# Patient Record
Sex: Female | Born: 1990 | Race: Black or African American | Hispanic: No | Marital: Single | Smoking: Former smoker
Health system: Southern US, Community
[De-identification: ages and names within clinical notes are randomized; demographics above are authoritative.]

## PROBLEM LIST (undated history)

## (undated) DIAGNOSIS — D649 Anemia, unspecified: Secondary | ICD-10-CM

## (undated) DIAGNOSIS — R87629 Unspecified abnormal cytological findings in specimens from vagina: Secondary | ICD-10-CM

## (undated) DIAGNOSIS — R161 Splenomegaly, not elsewhere classified: Secondary | ICD-10-CM

## (undated) DIAGNOSIS — K754 Autoimmune hepatitis: Secondary | ICD-10-CM

## (undated) DIAGNOSIS — I8501 Esophageal varices with bleeding: Secondary | ICD-10-CM

## (undated) DIAGNOSIS — K7682 Hepatic encephalopathy: Secondary | ICD-10-CM

## (undated) DIAGNOSIS — D689 Coagulation defect, unspecified: Secondary | ICD-10-CM

## (undated) DIAGNOSIS — N912 Amenorrhea, unspecified: Secondary | ICD-10-CM

## (undated) DIAGNOSIS — R011 Cardiac murmur, unspecified: Secondary | ICD-10-CM

## (undated) DIAGNOSIS — R188 Other ascites: Secondary | ICD-10-CM

## (undated) DIAGNOSIS — K7460 Unspecified cirrhosis of liver: Secondary | ICD-10-CM

## (undated) DIAGNOSIS — K219 Gastro-esophageal reflux disease without esophagitis: Secondary | ICD-10-CM

## (undated) DIAGNOSIS — K652 Spontaneous bacterial peritonitis: Secondary | ICD-10-CM

## (undated) DIAGNOSIS — K7290 Hepatic failure, unspecified without coma: Secondary | ICD-10-CM

## (undated) DIAGNOSIS — A64 Unspecified sexually transmitted disease: Secondary | ICD-10-CM

## (undated) DIAGNOSIS — Z9289 Personal history of other medical treatment: Secondary | ICD-10-CM

## (undated) HISTORY — DX: Unspecified abnormal cytological findings in specimens from vagina: R87.629

## (undated) HISTORY — PX: ESOPHAGEAL VARICE LIGATION: SHX625

## (undated) HISTORY — PX: PARACENTESIS: SHX844

---
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
## 2010-01-28 DIAGNOSIS — D649 Anemia, unspecified: Secondary | ICD-10-CM

## 2010-01-28 HISTORY — DX: Anemia, unspecified: D64.9

## 2011-03-24 ENCOUNTER — Encounter (HOSPITAL_COMMUNITY): Payer: Self-pay | Admitting: Emergency Medicine

## 2011-03-24 ENCOUNTER — Emergency Department (HOSPITAL_COMMUNITY)
Admission: EM | Admit: 2011-03-24 | Discharge: 2011-03-24 | Disposition: A | Discharge status: Home / Self Care | Payer: Medicaid Other | Attending: Emergency Medicine | Admitting: Emergency Medicine

## 2011-03-24 DIAGNOSIS — R1110 Vomiting, unspecified: Secondary | ICD-10-CM | POA: Insufficient documentation

## 2011-03-24 DIAGNOSIS — K754 Autoimmune hepatitis: Secondary | ICD-10-CM

## 2011-03-24 DIAGNOSIS — R1013 Epigastric pain: Secondary | ICD-10-CM | POA: Insufficient documentation

## 2011-03-24 LAB — COMPREHENSIVE METABOLIC PANEL
ALT: 115 U/L — ABNORMAL HIGH (ref 0–35)
Alkaline Phosphatase: 933 U/L — ABNORMAL HIGH (ref 39–117)
BUN: 6 mg/dL (ref 6–23)
CO2: 20 mEq/L (ref 19–32)
Chloride: 106 mEq/L (ref 96–112)
GFR calc Af Amer: >90 mL/min (ref >90)
GFR calc non Af Amer: >90 mL/min (ref >90)
Glucose, Bld: 121 mg/dL — ABNORMAL HIGH (ref 70–99)
Potassium: 2.9 mEq/L — ABNORMAL LOW (ref 3.5–5.1)
Sodium: 139 mEq/L (ref 135–145)
Total Bilirubin: 5.5 mg/dL — ABNORMAL HIGH (ref 0.3–1.2)
Total Protein: 6.6 g/dL (ref 6.0–8.3)

## 2011-03-24 LAB — CBC
Hemoglobin: 12.3 g/dL (ref 12.0–15.0)
MCH: 28.9 pg (ref 26.0–34.0)
RBC: 4.25 MIL/uL (ref 3.87–5.11)
WBC: 10.3 10*3/uL (ref 4.0–10.5)

## 2011-03-24 LAB — LIPASE, BLOOD: Lipase: 24 U/L (ref 11–59)

## 2011-03-24 LAB — URINALYSIS, ROUTINE W REFLEX MICROSCOPIC
Hgb urine dipstick: NEGATIVE
Ketones, ur: NEGATIVE mg/dL
Nitrite: NEGATIVE
Protein, ur: NEGATIVE mg/dL
Urobilinogen, UA: 1 mg/dL (ref 0.0–1.0)

## 2011-03-24 LAB — DIFFERENTIAL
Eosinophils Absolute: 0 10*3/uL (ref 0.0–0.7)
Lymphocytes Relative: 4 % — ABNORMAL LOW (ref 12–46)
Lymphs Abs: 0.5 10*3/uL — ABNORMAL LOW (ref 0.7–4.0)
Monocytes Relative: 9 % (ref 3–12)
Neutrophils Relative %: 86 % — ABNORMAL HIGH (ref 43–77)

## 2011-03-24 MED ORDER — ONDANSETRON HCL 4 MG PO TABS: 4.0000 mg | ORAL_TABLET | Freq: Three times a day (TID) | ORAL | Status: AC | PRN

## 2011-03-24 MED ORDER — MORPHINE SULFATE 4 MG/ML IJ SOLN
4.0000 mg | Freq: Once | INTRAMUSCULAR | Status: AC
Start: 2011-03-24 — End: 2011-03-24
  Administered 2011-03-24: 4 mg via INTRAVENOUS
  Filled 2011-03-24: qty 1

## 2011-03-24 MED ORDER — ONDANSETRON HCL 4 MG/2ML IJ SOLN
4.0000 mg | Freq: Once | INTRAMUSCULAR | Status: AC
  Administered 2011-03-24: 4 mg via INTRAVENOUS
  Filled 2011-03-24: qty 2

## 2011-03-24 MED ORDER — SODIUM CHLORIDE 0.9 % IV SOLN
1000.0000 mL | Freq: Once | INTRAVENOUS | Status: AC
  Administered 2011-03-24: 1000 mL via INTRAVENOUS

## 2011-03-24 MED ORDER — POTASSIUM CHLORIDE 10 MEQ/100ML IV SOLN
10.0000 meq | Freq: Once | INTRAVENOUS | Status: AC
  Administered 2011-03-24: 10 meq via INTRAVENOUS
  Filled 2011-03-24: qty 100

## 2011-03-24 MED ORDER — MORPHINE SULFATE 4 MG/ML IJ SOLN
4.0000 mg | Freq: Once | INTRAMUSCULAR | Status: AC
  Administered 2011-03-24: 4 mg via INTRAVENOUS
  Filled 2011-03-24: qty 1

## 2011-03-24 MED ORDER — SODIUM CHLORIDE 0.9 % IV SOLN
1000.0000 mL | INTRAVENOUS | Status: DC
  Administered 2011-03-24: 1000 mL via INTRAVENOUS

## 2011-03-24 MED ORDER — OXYCODONE HCL 5 MG PO TABS: 5.0000 mg | ORAL_TABLET | ORAL | Status: AC | PRN

## 2011-03-24 NOTE — ED Provider Notes (Signed)
History     CSN: [id number]  Arrival date & time 03/24/11  0820   First MD Initiated Contact with Patient 03/24/11 [phone]      Chief Complaint  Patient presents with  . Abdominal Pain  . Emesis    HPI Pt comes in with severe abdominal pain and vomiting since 4 am.  She says she does not know how many times she has vomited.  She denies bloody or bilious vomiting.  She denies sick contacts.  She says she has felt feverish and chills.  She denies diarrhea.  Pt has history of autoimmune hepatitis, but is not able to say much about the state of her liver.  She says She takes medications for it, but cannot name them right now.   Pt denies any dysuria, back pain, or vaginal discharge.  She says she is sexually active with her boyfriend only, they use condoms and she does not think she is pregnant.   Pt's hepatologist is Dr. Woodfin Ganja at [facility].  Pt says she has had her LFT's elevated before, she has been on and off the transplant list due to fluctuating liver tests.   Past Medical History  Diagnosis Date  . Liver disease     Past Surgical History  Procedure Date  . Gastric banding port revision   . Tonsillectomy     History reviewed. No pertinent family history.  History  Substance Use Topics  . Smoking status: Never Smoker   . Smokeless tobacco: Not on file  . Alcohol Use: No     Review of Systems  Constitutional: Positive for fever and chills.  HENT: Negative for congestion.   Eyes: Negative for visual disturbance.  Respiratory: Negative for shortness of breath.   Cardiovascular: Negative for chest pain.  Gastrointestinal: Positive for nausea, vomiting and abdominal pain. Negative for diarrhea and blood in stool.  Genitourinary: Negative for dysuria, hematuria, decreased urine volume, vaginal discharge and vaginal pain.  Musculoskeletal: Negative for back pain.  Skin: Negative for rash.  Neurological: Positive for dizziness.    Allergies  Review of patient's  allergies indicates no known allergies.  Home Medications   Current Outpatient Rx  Name Route Sig Dispense Refill  . AZATHIOPRINE 50 MG PO TABS Oral Take 50 mg by mouth at bedtime.     Marland Kitchen LACTULOSE 10 GM/15ML PO SOLN Oral Take 20 g by mouth 2 (two) times daily as needed. For constipation.    Marland Kitchen LEVOFLOXACIN 750 MG PO TABS Oral Take 750 mg by mouth daily.    Marland Kitchen PREDNISONE 5 MG PO TABS Oral Take 5 mg by mouth daily.    Marland Kitchen PROPRANOLOL HCL ER 60 MG PO CP24 Oral Take 60 mg by mouth daily.    Marland Kitchen SPIRONOLACTONE 25 MG PO TABS Oral Take 25 mg by mouth at bedtime.     Marland Kitchen URSODIOL 300 MG PO CAPS Oral Take 600 mg by mouth every morning.       BP 115/75  Pulse 80  Temp(Src) 99.4 F (37.4 C) (Oral)  Resp 18  SpO2 99%  LMP 03/07/2011  Physical Exam  Nursing note and vitals reviewed. Constitutional: She is oriented to person, place, and time. She appears well-developed and well-nourished. She appears distressed.  HENT:  Head: Normocephalic and atraumatic.  Eyes: EOM are normal. Pupils are equal, round, and reactive to light.  Neck: Normal range of motion. Neck supple.  Cardiovascular: Normal rate, regular rhythm, normal heart sounds and intact distal pulses.  Pulmonary/Chest: Effort normal and breath sounds normal. No respiratory distress.  Abdominal: Soft. There is tenderness. There is no rebound.       Pt extremely tender to palpation, particularly in upper quadrants, unable to palpate fully, but no hepatosplenomegaly.   Musculoskeletal: She exhibits no edema.  Neurological: She is alert and oriented to person, place, and time.  Skin: Skin is warm. No rash noted.    ED Course  Procedures (including critical care time)  Labs Reviewed  CBC - Abnormal; Notable for the following:    RDW 16.5 (*)    Platelets 74 (*) PLATELET COUNT CONFIRMED BY SMEAR   All other components within normal limits  DIFFERENTIAL - Abnormal; Notable for the following:    Neutrophils Relative 86 (*)    Neutro Abs  8.9 (*)    Lymphocytes Relative 4 (*)    Lymphs Abs 0.5 (*)    All other components within normal limits  COMPREHENSIVE METABOLIC PANEL - Abnormal; Notable for the following:    Potassium 2.9 (*)    Glucose, Bld 121 (*)    Albumin 3.0 (*)    AST 153 (*)    ALT 115 (*)    Alkaline Phosphatase 933 (*)    Total Bilirubin 5.5 (*)    All other components within normal limits  URINALYSIS, ROUTINE W REFLEX MICROSCOPIC - Abnormal; Notable for the following:    Color, Urine AMBER (*) BIOCHEMICALS MAY BE AFFECTED BY COLOR   Bilirubin Urine SMALL (*)    All other components within normal limits  LIPASE, BLOOD  PREGNANCY, URINE   No results found.   1. Autoimmune hepatitis   2. Abdominal pain, epigastric   3. Vomiting       MDM  LFT's elevated. Pt symptomatically improved with IV Zofran and  Morphine. Called on-call physician at [facility].  I spoke with Dr. Marcy Salvo, GI Fellow.  He reviewed pt's chart and said that her last set of liver enzymes were very similar to the ones from today shown above.  He felt that if patient's pain and nausea could be controlled she could be safely discharged home and have close follow up with Dr. Piedad Climes.          Ardyth Gal, MD 03/24/11 1435

## 2011-03-24 NOTE — ED Notes (Signed)
Pt reports, "I have stomach pains and I have been throwing up since 4 O'clock this morning."

## 2011-03-24 NOTE — ED Notes (Signed)
Pt comfortable. Family at the bedside.

## 2011-03-24 NOTE — Discharge Instructions (Signed)
Autoimmune Hepatitis Autoimmune hepatitis is a disease in which the body's immune system attacks liver cells. The immune system is the system in your body which fights off disease. When the body's immune system attacks the liver, the liver becomes inflamed and causes hepatitis. Researchers think a genetic factor (passed from parent to child) may make it more likely for some people to have autoimmune diseases. About 70% of those with autoimmune hepatitis are women. Most people with autoimmune hepatitis are between the ages of 92 and 56, but the disease may happen at any age. The disease is usually serious. If not treated, it will get worse over time, and it can last for years. It can lead to scarring and hardening of the liver (cirrhosis), and it can lead to liver failure. Autoimmune hepatitis is classified as either type 1 or 2. Type 1 is the most common form in [place]. It occurs at any age and is more common among women. About half of those with type 1 have other autoimmune disorders, such as type 1 diabetes, proliferative glomerulonephritis, thyroiditis, Kassebaum' disease, Sjgren's syndrome, autoimmune anemia, and ulcerative colitis. Your caregiver can tell you about these disorders and if they apply to you. Type 2 autoimmune hepatitis is less common. It usually affects girls ages 2 to 33, but adults can have it, too. AUTOIMMUNE DISEASE One job of the immune system is to protect the body from viruses, bacteria, and other living organisms. Usually, the immune system does not react against the body's own cells. Occasionally, the immune system mistakenly attacks the cells it is supposed to protect, and when this happens, the response is called autoimmunity. Researchers think that certain bacteria, viruses, toxins, and drugs trigger an autoimmune response in people who are genetically susceptible to developing an autoimmune disorder. This means it could be a tendency you inherited from your  parents. SYMPTOMS  Fatigue is probably the most common symptom of this disease. Other symptoms include:  Enlarged liver.   Jaundice.   Itching.   Skin rashes.   Joint pain.   Abdominal discomfort.   Abnormal blood vessels (spider angiomas) on the skin.   Nausea.   Vomiting.   Loss of appetite.   Dark urine.   Pale or gray colored stools.  People in advanced stages of the disease are more likely to have symptoms such as fluid in the abdomen (ascites) or mental confusion. Women may stop having menstrual periods.  Symptoms of autoimmune hepatitis range from mild to severe. Hepatitis caused by a virus or certain medications may have similar symptoms as autoimmune hepatitis, so tests may be needed for an exact diagnosis. Your caregiver will also review all your medicines before diagnosing autoimmune hepatitis. DIAGNOSIS  Your caregiver will make a diagnosis based on your symptoms, blood tests, and liver biopsy.  Blood tests. A routine blood test for liver enzymes can help show a common pattern of hepatitis.   Liver biopsy. A tiny sample of your liver tissue will be examined under a microscope. It can help your caregiver diagnose autoimmune hepatitis and tell how serious it is. You will go to a hospital or outpatient surgical facility for this procedure.  TREATMENT  Treatment works best when autoimmune hepatitis is discovered early. With proper treatment, this disease can usually be controlled. Recent studies show that continued response to treatment not only stops the disease from getting worse, but also may actually reverse some of the damage. The primary treatment is medicine to slow down (suppress) an overactive immune  system. Both types of autoimmune hepatitis are treated with daily doses of a corticosteroid. This is also called prednisone. Your caregiver may start you on a high dose (20 to 60 mg per day) and lower the dose to 5 to 15 mg per day as the disease is controlled. The  goal is to find the lowest possible dose that will control your disease. Another medicine, azathioprine, is also used to treat autoimmune hepatitis. Like prednisone, azathioprine suppresses the immune system, but in a different way. It helps lower the dose of prednisone needed, which reduces its side effects. Your caregiver may prescribe azathioprine, in addition to prednisone, once your disease is under control. Most people will need to take prednisone, with or without azathioprine, for years. Some people take it for life. These medicines may slow down the disease, but everyone is different. In about 1 out of every 3 people, treatment can eventually be stopped. After stopping, it is important to carefully monitor your condition. Promptly report any new symptoms to your caregiver. The disease may return and be even more severe. This is especially true during the first few months after stopping treatment. In about 7 out of 10 people, the disease goes into an inactive state (remission) within 2 years of starting treatment. During that time the symptoms are less severe. Some persons with a remission will see the disease return within 3 years, so treatment may be necessary on and off for years, and possibly for life. People with autoimmune hepatitis should also be vaccinated against hepatitis A virus and hepatitis B virus. Vaccination should be done as soon as possible after the diagnosis is made. Side Effects Both prednisone and azathioprine have side effects. Because high doses of prednisone are needed to control this disease, managing side effects is very important. Most side effects appear only after a long period of time. Some possible side effects of prednisone are:  Weight gain.   Thinning of the bones (osteoporosis).   Diabetes.   Cataracts.   Acne.   Anxiety and confusion.   Thinning of the hair and skin.   High blood pressure.   Glaucoma.   Rounded face.  Azathioprine can lower your  white blood cell count; these are the cells that fight infection. Sometimes it causes nausea and poor appetite. Rare side effects are:  Allergic reaction.   Liver damage.   Inflammation of the pancreas gland with severe stomach pain (pancreatitis).   More susceptible to infection.   Diarrhea.  People who do not get better with standard immune therapy or who have severe side effects may be helped by other medicine. People who progress to end stage liver disease (liver failure) or cirrhosis or both may need a liver transplant. Transplantation has a 1-year survival rate of 90% and a 5-year survival rate of 70% to 80%. FOR MORE INFORMATION  American Liver Foundation (ALF): www.liverfoundation.org National Digestive Diseases Information Clearinghouse: CheatPrevention.com.au Document Released: 04/06/2003 Document Revised: 09/26/2010 Document Reviewed: 11/16/2008 [facility] Patient Information 2012 [place], [place].

## 2011-03-24 NOTE — ED Notes (Signed)
Family at bedside and tolerating fluids orally.

## 2011-03-24 NOTE — ED Notes (Signed)
Pt up to restroom and obtaining urine sample

## 2011-03-24 NOTE — ED Notes (Signed)
Pt knows that urine is needed. Pt does not have to void at this time. Pt is getting IV fluids. Pt and family was informed to tell staff when she has to void.

## 2011-03-24 NOTE — ED Notes (Signed)
Pt is resting and vomiting has resolved. Will continue to monitor

## 2011-03-24 NOTE — ED Provider Notes (Signed)
I saw and evaluated the patient, reviewed the resident's note and I agree with the findings and plan.   .Face to face Exam:  General:  Awake HEENT:  Atraumatic Resp:  Normal effort Abd:  Nondistended Neuro:No focal weakness Lymph: No adenopathy   Nelia Shi, MD 03/24/11 2244

## 2011-04-09 ENCOUNTER — Encounter (HOSPITAL_COMMUNITY): Payer: Self-pay | Admitting: Emergency Medicine

## 2011-04-09 ENCOUNTER — Emergency Department (HOSPITAL_COMMUNITY)
Admission: EM | Admit: 2011-04-09 | Discharge: 2011-04-09 | Disposition: A | Discharge status: Home / Self Care | Payer: Medicaid Other | Attending: Emergency Medicine | Admitting: Emergency Medicine

## 2011-04-09 DIAGNOSIS — R110 Nausea: Secondary | ICD-10-CM | POA: Insufficient documentation

## 2011-04-09 DIAGNOSIS — R109 Unspecified abdominal pain: Secondary | ICD-10-CM | POA: Insufficient documentation

## 2011-04-09 LAB — COMPREHENSIVE METABOLIC PANEL
ALT: 93 U/L — ABNORMAL HIGH (ref 0–35)
CO2: 23 mEq/L (ref 19–32)
Calcium: 9.2 mg/dL (ref 8.4–10.5)
Creatinine, Ser: 0.56 mg/dL (ref 0.50–1.10)
GFR calc Af Amer: >90 mL/min (ref >90)
GFR calc non Af Amer: >90 mL/min (ref >90)
Glucose, Bld: 109 mg/dL — ABNORMAL HIGH (ref 70–99)
Sodium: 136 mEq/L (ref 135–145)
Total Protein: 6.8 g/dL (ref 6.0–8.3)

## 2011-04-09 LAB — DIFFERENTIAL
Basophils Absolute: 0 10*3/uL (ref 0.0–0.1)
Eosinophils Absolute: 0.1 10*3/uL (ref 0.0–0.7)
Eosinophils Relative: 1 % (ref 0–5)
Lymphocytes Relative: 8 % — ABNORMAL LOW (ref 12–46)
Lymphs Abs: 0.9 10*3/uL (ref 0.7–4.0)
Monocytes Absolute: 0.4 10*3/uL (ref 0.1–1.0)

## 2011-04-09 LAB — CBC
HCT: 36.1 % (ref 36.0–46.0)
MCH: 29.3 pg (ref 26.0–34.0)
MCV: 86.8 fL (ref 78.0–100.0)
Platelets: 155 10*3/uL (ref 150–400)
RDW: 16.1 % — ABNORMAL HIGH (ref 11.5–15.5)
WBC: 11.1 10*3/uL — ABNORMAL HIGH (ref 4.0–10.5)

## 2011-04-09 MED ORDER — ONDANSETRON HCL 4 MG/2ML IJ SOLN
4.0000 mg | Freq: Once | INTRAMUSCULAR | Status: AC
  Administered 2011-04-09: 4 mg via INTRAVENOUS
  Filled 2011-04-09: qty 2

## 2011-04-09 MED ORDER — HYDROMORPHONE HCL PF 1 MG/ML IJ SOLN
1.0000 mg | Freq: Once | INTRAMUSCULAR | Status: AC
  Administered 2011-04-09: 1 mg via INTRAVENOUS
  Filled 2011-04-09: qty 1

## 2011-04-09 MED ORDER — SODIUM CHLORIDE 0.9 % IV BOLUS (SEPSIS)
1000.0000 mL | Freq: Once | INTRAVENOUS | Status: AC
  Administered 2011-04-09: 1000 mL via INTRAVENOUS

## 2011-04-09 NOTE — ED Notes (Signed)
Oxygen 1L applied via [place] due to sats dropping after pain meds and falling asleep

## 2011-04-09 NOTE — ED Notes (Signed)
Patient vomiting at this time.

## 2011-04-09 NOTE — ED Notes (Signed)
Patient presents crying with abd pain  States that she has liver disease

## 2011-04-09 NOTE — ED Provider Notes (Signed)
History     CSN: [id number]  Arrival date & time 04/09/11  9562   First MD Initiated Contact with Patient 04/09/11 [phone]      Chief Complaint  Patient presents with  . Abdominal Pain    HPI The patient presents with abdominal pain and nausea.  Her symptoms began approximately 2 hours ago, subacutely.  Since onset there is been persistent diffuse crampy/sharp abdominal pain.  The patient has not attempted any relief with OTC medications thus far.  She is nauseous, but denies any vomiting or diarrhea.  She denies any dyspnea or chest pain.  No f/c. Notably the patient has autoimmune hepatitis and is a patient at [facility].  Past Medical History  Diagnosis Date  . Liver disease   . Hepatitis     Past Surgical History  Procedure Date  . Gastric banding port revision   . Tonsillectomy     No family history on file.  History  Substance Use Topics  . Smoking status: Never Smoker   . Smokeless tobacco: Not on file  . Alcohol Use: No    OB History    Grav Para Term Preterm Abortions TAB SAB Ect Mult Living                  Review of Systems  Constitutional:       HPI  HENT:       HPI otherwise negative  Eyes: Negative.   Respiratory:       HPI, otherwise negative  Cardiovascular:       HPI, otherwise nmegative  Gastrointestinal: Negative for vomiting.  Genitourinary:       HPI, otherwise negative  Musculoskeletal:       HPI, otherwise negative  Skin: Negative.   Neurological: Negative for syncope.    Allergies  Review of patient's allergies indicates no known allergies.  Home Medications   Current Outpatient Rx  Name Route Sig Dispense Refill  . AZATHIOPRINE 50 MG PO TABS Oral Take 100 mg by mouth daily.     Marland Kitchen LACTULOSE 10 GM/15ML PO SOLN Oral Take 20 g by mouth 2 (two) times daily as needed. For constipation.    Marland Kitchen LEVOFLOXACIN 750 MG PO TABS Oral Take 750 mg by mouth once a week.     Marland Kitchen OMEPRAZOLE 20 MG PO CPDR Oral Take 20 mg by mouth daily.    Marland Kitchen PREDNISONE  5 MG PO TABS Oral Take 5 mg by mouth daily.    Marland Kitchen PROPRANOLOL HCL ER 60 MG PO CP24 Oral Take 60 mg by mouth daily.    Marland Kitchen SPIRONOLACTONE 25 MG PO TABS Oral Take 25 mg by mouth at bedtime.     Marland Kitchen URSODIOL 300 MG PO CAPS Oral Take 300 mg by mouth 2 (two) times daily.       BP 94/41  Pulse 107  Temp(Src) 99.7 F (37.6 C) (Oral)  Resp 20  SpO2 95%  LMP 03/07/2011  Physical Exam  Nursing note and vitals reviewed. Constitutional: She is oriented to person, place, and time. She appears well-developed and well-nourished. No distress.  HENT:  Head: Normocephalic and atraumatic.  Eyes: Conjunctivae and EOM are normal.  Cardiovascular: Regular rhythm.  Tachycardia present.   Pulmonary/Chest: Effort normal and breath sounds normal. No stridor. No respiratory distress.  Abdominal: Soft. Normal appearance. She exhibits no distension. There is generalized tenderness. There is guarding. There is no rigidity, no rebound and no CVA tenderness.  Musculoskeletal: She exhibits no edema.  Neurological:  She is alert and oriented to person, place, and time. No cranial nerve deficit.  Skin: Skin is warm and dry.  Psychiatric: She has a normal mood and affect.    ED Course  Procedures (including critical care time)   Labs Reviewed  CBC  DIFFERENTIAL  COMPREHENSIVE METABOLIC PANEL  LIPASE, BLOOD  URINALYSIS, ROUTINE W REFLEX MICROSCOPIC   No results found.   No diagnosis found.    MDM  This young female with autoimmune hepatitis presents with abdominal pain, nausea.  On initial exam the patient was uncomfortable appearing, tachycardic.  Following ED interventions, the patient wasn't sleeping.  She was observed to be sleeping for 2 hours, and was discharged in stable condition.  The patient's labs were consistent with a recent presentation, following which the patient was discharged in stable condition with follow up with her specialist at [facility].  Today the patient was discharged in stable condition  with similar followup instructions  Gerhard Munch, MD 04/09/11 [phone]

## 2011-04-09 NOTE — ED Notes (Signed)
PT. REPORTS GENERALIZED ABDOMINAL PAIN WITH NAUSEA ONSETT HIS MORNING , DENIES VOMITTING /DIARRHEA OR FEVER , SLIGHT CHILLS.

## 2011-04-09 NOTE — Discharge Instructions (Signed)

## 2011-04-13 ENCOUNTER — Encounter (HOSPITAL_COMMUNITY): Payer: Self-pay | Admitting: Nurse Practitioner

## 2011-04-13 ENCOUNTER — Emergency Department (HOSPITAL_COMMUNITY)
Admission: EM | Admit: 2011-04-13 | Discharge: 2011-04-14 | Disposition: A | Discharge status: Home / Self Care | Payer: Medicaid Other | Attending: Emergency Medicine | Admitting: Emergency Medicine

## 2011-04-13 DIAGNOSIS — K769 Liver disease, unspecified: Secondary | ICD-10-CM | POA: Insufficient documentation

## 2011-04-13 DIAGNOSIS — R109 Unspecified abdominal pain: Secondary | ICD-10-CM | POA: Insufficient documentation

## 2011-04-13 DIAGNOSIS — N390 Urinary tract infection, site not specified: Secondary | ICD-10-CM

## 2011-04-13 DIAGNOSIS — K759 Inflammatory liver disease, unspecified: Secondary | ICD-10-CM | POA: Insufficient documentation

## 2011-04-13 DIAGNOSIS — F17200 Nicotine dependence, unspecified, uncomplicated: Secondary | ICD-10-CM | POA: Insufficient documentation

## 2011-04-13 DIAGNOSIS — R197 Diarrhea, unspecified: Secondary | ICD-10-CM | POA: Insufficient documentation

## 2011-04-13 DIAGNOSIS — R110 Nausea: Secondary | ICD-10-CM | POA: Insufficient documentation

## 2011-04-13 LAB — URINALYSIS, ROUTINE W REFLEX MICROSCOPIC
Nitrite: POSITIVE — AB
Protein, ur: NEGATIVE mg/dL
Specific Gravity, Urine: 1.013 (ref 1.005–1.030)
Urobilinogen, UA: 4 mg/dL — ABNORMAL HIGH (ref 0.0–1.0)

## 2011-04-13 LAB — CBC
MCH: 30.3 pg (ref 26.0–34.0)
MCV: 87.5 fL (ref 78.0–100.0)
Platelets: 63 10*3/uL — ABNORMAL LOW (ref 150–400)
RDW: 16.2 % — ABNORMAL HIGH (ref 11.5–15.5)

## 2011-04-13 LAB — URINE MICROSCOPIC-ADD ON

## 2011-04-13 LAB — DIFFERENTIAL
Basophils Absolute: 0 10*3/uL (ref 0.0–0.1)
Eosinophils Relative: 0 % (ref 0–5)
Lymphs Abs: 0.5 10*3/uL — ABNORMAL LOW (ref 0.7–4.0)
Monocytes Absolute: 0.4 10*3/uL (ref 0.1–1.0)

## 2011-04-13 LAB — COMPREHENSIVE METABOLIC PANEL
AST: 83 U/L — ABNORMAL HIGH (ref 0–37)
BUN: 11 mg/dL (ref 6–23)
CO2: 25 mEq/L (ref 19–32)
Calcium: 8.8 mg/dL (ref 8.4–10.5)
Creatinine, Ser: 0.62 mg/dL (ref 0.50–1.10)
GFR calc non Af Amer: >90 mL/min (ref >90)

## 2011-04-13 LAB — POCT PREGNANCY, URINE: Preg Test, Ur: NEGATIVE

## 2011-04-13 LAB — LIPASE, BLOOD: Lipase: 39 U/L (ref 11–59)

## 2011-04-13 MED ORDER — HYDROMORPHONE HCL PF 1 MG/ML IJ SOLN: 1.0000 mg | Freq: Once | INTRAMUSCULAR | Status: DC

## 2011-04-13 MED ORDER — SODIUM CHLORIDE 0.9 % IV BOLUS (SEPSIS)
1000.0000 mL | Freq: Once | INTRAVENOUS | Status: AC
  Administered 2011-04-13: 1000 mL via INTRAVENOUS

## 2011-04-13 MED ORDER — DEXTROSE 5 % IV SOLN
1.0000 g | Freq: Once | INTRAVENOUS | Status: AC
  Administered 2011-04-13: 1 g via INTRAVENOUS
  Filled 2011-04-13: qty 10

## 2011-04-13 MED ORDER — ONDANSETRON HCL 4 MG/2ML IJ SOLN
4.0000 mg | Freq: Once | INTRAMUSCULAR | Status: AC
  Administered 2011-04-13: 4 mg via INTRAVENOUS
  Filled 2011-04-13: qty 2

## 2011-04-13 MED ORDER — HYDROMORPHONE HCL PF 1 MG/ML IJ SOLN
0.5000 mg | Freq: Once | INTRAMUSCULAR | Status: AC
  Administered 2011-04-13: 0.5 mg via INTRAVENOUS
  Filled 2011-04-13: qty 1

## 2011-04-13 MED ORDER — HYDROMORPHONE HCL PF 1 MG/ML IJ SOLN
1.0000 mg | Freq: Once | INTRAMUSCULAR | Status: AC
  Administered 2011-04-13: 1 mg via INTRAVENOUS
  Filled 2011-04-13: qty 1

## 2011-04-13 NOTE — ED Provider Notes (Signed)
History     CSN: [id number]  Arrival date & time 04/13/11  2130   First MD Initiated Contact with Patient 04/13/11 2104      Chief Complaint  Patient presents with  . Abdominal Pain    (Consider location/radiation/quality/duration/timing/severity/associated sxs/prior treatment) HPI Pt with history of autoimmune hepatitis and multiple episodes of abdominal pain with nausea, followed at [facility]. Presents with upper abdominal pain starting this morning with nausea and several episodes of diarrhea. No fever, chills, vomiting, vaginal or urinary symptoms. Pain is same as similar flares of her chronic diseas Past Medical History  Diagnosis Date  . Liver disease   . Hepatitis     Past Surgical History  Procedure Date  . Gastric banding port revision   . Tonsillectomy     No family history on file.  History  Substance Use Topics  . Smoking status: Current Everyday Smoker -- 0.5 packs/day  . Smokeless tobacco: Not on file  . Alcohol Use: No    OB History    Grav Para Term Preterm Abortions TAB SAB Ect Mult Living                  Review of Systems  Constitutional: Negative for fever and chills.  Respiratory: Negative for shortness of breath.   Cardiovascular: Negative for chest pain.  Gastrointestinal: Positive for nausea, abdominal pain and diarrhea. Negative for vomiting, constipation and blood in stool.  Neurological: Negative for dizziness, weakness, light-headedness and numbness.    Allergies  Review of patient's allergies indicates no known allergies.  Home Medications   Current Outpatient Rx  Name Route Sig Dispense Refill  . AZATHIOPRINE 50 MG PO TABS Oral Take 100 mg by mouth daily.     Marland Kitchen LACTULOSE 10 GM/15ML PO SOLN Oral Take 20 g by mouth 2 (two) times daily as needed. For constipation.    Marland Kitchen LEVOFLOXACIN 750 MG PO TABS Oral Take 750 mg by mouth once a week.     Marland Kitchen OMEPRAZOLE 20 MG PO CPDR Oral Take 20 mg by mouth daily.    Marland Kitchen PREDNISONE 5 MG PO TABS Oral  Take 5 mg by mouth daily.    Marland Kitchen PROPRANOLOL HCL ER 60 MG PO CP24 Oral Take 60 mg by mouth daily.    Marland Kitchen SPIRONOLACTONE 25 MG PO TABS Oral Take 25 mg by mouth at bedtime.     Marland Kitchen URSODIOL 300 MG PO CAPS Oral Take 300 mg by mouth 2 (two) times daily.     . TRAMADOL HCL 50 MG PO TABS Oral Take 1 tablet (50 mg total) by mouth every 6 (six) hours as needed for pain. 30 tablet 0    BP 96/59  Pulse 94  Temp(Src) 98.8 F (37.1 C) (Oral)  Resp 22  Ht 4\' 11"  (1.499 m)  Wt 129 lb (58.514 kg)  BMI 26.05 kg/m2  SpO2 99%  LMP 03/07/2011  Physical Exam  Nursing note and vitals reviewed. Constitutional: She is oriented to person, place, and time. She appears well-developed and well-nourished. She appears distressed.  HENT:  Head: Normocephalic and atraumatic.  Mouth/Throat: Oropharynx is clear and moist.  Eyes: EOM are normal. Pupils are equal, round, and reactive to light.  Neck: Normal range of motion. Neck supple.  Cardiovascular: Normal rate and regular rhythm.   Pulmonary/Chest: Effort normal and breath sounds normal. No respiratory distress. She has no wheezes. She has no rales.  Abdominal: Soft. Bowel sounds are normal. There is tenderness (difficult exam due to pt's  lack of cooperation). There is no rebound and no guarding.  Musculoskeletal: Normal range of motion. She exhibits no edema and no tenderness.  Neurological: She is alert and oriented to person, place, and time.  Skin: Skin is warm and dry. No rash noted. No erythema.  Psychiatric: She has a normal mood and affect. Her behavior is normal.    ED Course  Procedures (including critical care time)  Labs Reviewed  URINALYSIS, ROUTINE W REFLEX MICROSCOPIC - Abnormal; Notable for the following:    Color, Urine ORANGE (*) BIOCHEMICALS MAY BE AFFECTED BY COLOR   APPearance CLOUDY (*)    Hgb urine dipstick LARGE (*)    Bilirubin Urine LARGE (*)    Ketones, ur 15 (*)    Urobilinogen, UA 4.0 (*)    Nitrite POSITIVE (*)    Leukocytes,  UA TRACE (*)    All other components within normal limits  URINE MICROSCOPIC-ADD ON - Abnormal; Notable for the following:    Squamous Epithelial / LPF MANY (*)    Bacteria, UA MANY (*)    All other components within normal limits  CBC - Abnormal; Notable for the following:    WBC 11.9 (*)    RBC 3.76 (*)    Hemoglobin 11.4 (*)    HCT 32.9 (*)    RDW 16.2 (*)    Platelets 63 (*) PLATELET COUNT CONFIRMED BY SMEAR   All other components within normal limits  DIFFERENTIAL - Abnormal; Notable for the following:    Neutrophils Relative 93 (*)    Lymphocytes Relative 4 (*)    Neutro Abs 11.0 (*)    Lymphs Abs 0.5 (*)    All other components within normal limits  COMPREHENSIVE METABOLIC PANEL - Abnormal; Notable for the following:    Glucose, Bld 111 (*)    Albumin 2.8 (*)    AST 83 (*)    ALT 70 (*)    Alkaline Phosphatase 416 (*)    Total Bilirubin 6.6 (*)    All other components within normal limits  POCT PREGNANCY, URINE  LIPASE, BLOOD  PREGNANCY, URINE  URINE CULTURE   No results found.   1. Abdominal pain   2. UTI (lower urinary tract infection)       MDM  Pt states she is feeling much better. Will d/c home to f/u with PMD at [facility]. Return for worsening pain or concerns        Loren Racer, MD 04/14/11 [phone]

## 2011-04-13 NOTE — ED Notes (Signed)
Pt's family reports pt is having increased nausea - pt remain A&Ox4, pt given emesis bag. Will continue to monitor.

## 2011-04-13 NOTE — ED Notes (Signed)
C/o abd pain, nausea, loose stool x 2  since this am.

## 2011-04-14 MED ORDER — TRAMADOL HCL 50 MG PO TABS: 50.0000 mg | ORAL_TABLET | Freq: Four times a day (QID) | ORAL | Status: AC | PRN

## 2011-04-14 NOTE — Discharge Instructions (Signed)
 Abdominal Pain Abdominal pain can be caused by many things. Your caregiver decides the seriousness of your pain by an examination and possibly blood tests and X-rays. Many cases can be observed and treated at home. Most abdominal pain is not caused by a disease and will probably improve without treatment. However, in many cases, more time must pass before a clear cause of the pain can be found. Before that point, it may not be known if you need more testing, or if hospitalization or surgery is needed. HOME CARE INSTRUCTIONS   Do not take laxatives unless directed by your caregiver.   Take pain medicine only as directed by your caregiver.   Only take over-the-counter or prescription medicines for pain, discomfort, or fever as directed by your caregiver.   Try a clear liquid diet (broth, tea, or water) for as long as directed by your caregiver. Slowly move to a bland diet as tolerated.  SEEK IMMEDIATE MEDICAL CARE IF:   The pain does not go away.   You have a fever.   You keep throwing up (vomiting).   The pain is felt only in portions of the abdomen. Pain in the right side could possibly be appendicitis. In an adult, pain in the left lower portion of the abdomen could be colitis or diverticulitis.   You pass bloody or black tarry stools.  MAKE SURE YOU:   Understand these instructions.   Will watch your condition.   Will get help right away if you are not doing well or get worse.  Document Released: 10/24/2004 Document Revised: 01/03/2011 Document Reviewed: 09/02/2007 [facility] Patient Information 2012 [place], [place].

## 2011-06-09 ENCOUNTER — Encounter (HOSPITAL_COMMUNITY): Payer: Self-pay

## 2011-06-09 ENCOUNTER — Emergency Department (HOSPITAL_COMMUNITY)
Admission: EM | Admit: 2011-06-09 | Discharge: 2011-06-09 | Disposition: A | Discharge status: Home / Self Care | Payer: Medicaid Other | Attending: Emergency Medicine | Admitting: Emergency Medicine

## 2011-06-09 DIAGNOSIS — R1013 Epigastric pain: Secondary | ICD-10-CM | POA: Insufficient documentation

## 2011-06-09 DIAGNOSIS — R1031 Right lower quadrant pain: Secondary | ICD-10-CM | POA: Insufficient documentation

## 2011-06-09 DIAGNOSIS — R109 Unspecified abdominal pain: Secondary | ICD-10-CM | POA: Insufficient documentation

## 2011-06-09 DIAGNOSIS — K754 Autoimmune hepatitis: Secondary | ICD-10-CM

## 2011-06-09 DIAGNOSIS — R1110 Vomiting, unspecified: Secondary | ICD-10-CM | POA: Insufficient documentation

## 2011-06-09 LAB — COMPREHENSIVE METABOLIC PANEL
ALT: 134 U/L — ABNORMAL HIGH (ref 0–35)
AST: 170 U/L — ABNORMAL HIGH (ref 0–37)
Albumin: 3.2 g/dL — ABNORMAL LOW (ref 3.5–5.2)
Alkaline Phosphatase: 951 U/L — ABNORMAL HIGH (ref 39–117)
GFR calc Af Amer: >90 mL/min (ref >90)
Glucose, Bld: 79 mg/dL (ref 70–99)
Potassium: 3.4 mEq/L — ABNORMAL LOW (ref 3.5–5.1)
Sodium: 139 mEq/L (ref 135–145)
Total Protein: 7.2 g/dL (ref 6.0–8.3)

## 2011-06-09 LAB — URINALYSIS, ROUTINE W REFLEX MICROSCOPIC
Glucose, UA: NEGATIVE mg/dL
Specific Gravity, Urine: 1.008 (ref 1.005–1.030)
pH: 7.5 (ref 5.0–8.0)

## 2011-06-09 LAB — CBC
HCT: 38.2 % (ref 36.0–46.0)
MCHC: 35.1 g/dL (ref 30.0–36.0)
MCV: 87.6 fL (ref 78.0–100.0)
Platelets: ADEQUATE 10*3/uL (ref 150–400)
RDW: 16 % — ABNORMAL HIGH (ref 11.5–15.5)

## 2011-06-09 LAB — URINE MICROSCOPIC-ADD ON

## 2011-06-09 LAB — DIFFERENTIAL
Band Neutrophils: 0 % (ref 0–10)
Blasts: 0 %
Metamyelocytes Relative: 0 %
Myelocytes: 0 %
Promyelocytes Absolute: 0 %
nRBC: 0 /100 WBC

## 2011-06-09 LAB — POCT I-STAT, CHEM 8
BUN: 6 mg/dL (ref 6–23)
Chloride: 113 mEq/L — ABNORMAL HIGH (ref 96–112)
Creatinine, Ser: 0.5 mg/dL (ref 0.50–1.10)
Potassium: 3.3 mEq/L — ABNORMAL LOW (ref 3.5–5.1)
Sodium: 140 mEq/L (ref 135–145)
TCO2: 20 mmol/L (ref 0–100)

## 2011-06-09 LAB — POCT PREGNANCY, URINE: Preg Test, Ur: NEGATIVE

## 2011-06-09 MED ORDER — ONDANSETRON HCL 4 MG PO TABS: 4.0000 mg | ORAL_TABLET | Freq: Four times a day (QID) | ORAL | Status: AC

## 2011-06-09 MED ORDER — ONDANSETRON HCL 4 MG/2ML IJ SOLN
4.0000 mg | Freq: Once | INTRAMUSCULAR | Status: AC
  Administered 2011-06-09: 4 mg via INTRAVENOUS
  Filled 2011-06-09: qty 2

## 2011-06-09 MED ORDER — MORPHINE SULFATE 4 MG/ML IJ SOLN
4.0000 mg | Freq: Once | INTRAMUSCULAR | Status: AC
  Administered 2011-06-09: 4 mg via INTRAVENOUS
  Filled 2011-06-09: qty 1

## 2011-06-09 MED ORDER — HYDROCODONE-ACETAMINOPHEN 5-325 MG PO TABS: 1.0000 | ORAL_TABLET | ORAL | Status: AC | PRN

## 2011-06-09 NOTE — Discharge Instructions (Signed)
Your labs here were normal - your liver enzymes were consistent with your previous values. Please take the pain medication as needed. Call your doctors at [facility] to see if you can get an earlier appointment. If your pain changes locations, worsens, you run a fever, or with any other worrisome symptoms, please return to the ED.  Abdominal Pain Abdominal pain can be caused by many things. Your caregiver decides the seriousness of your pain by an examination and possibly blood tests and X-rays. Many cases can be observed and treated at home. Most abdominal pain is not caused by a disease and will probably improve without treatment. However, in many cases, more time must pass before a clear cause of the pain can be found. Before that point, it may not be known if you need more testing, or if hospitalization or surgery is needed. HOME CARE INSTRUCTIONS   Do not take laxatives unless directed by your caregiver.   Take pain medicine only as directed by your caregiver.   Only take over-the-counter or prescription medicines for pain, discomfort, or fever as directed by your caregiver.   Try a clear liquid diet (broth, tea, or water) for as long as directed by your caregiver. Slowly move to a bland diet as tolerated.  SEEK IMMEDIATE MEDICAL CARE IF:   The pain does not go away.   You have a fever.   You keep throwing up (vomiting).   The pain is felt only in portions of the abdomen. Pain in the right side could possibly be appendicitis. In an adult, pain in the left lower portion of the abdomen could be colitis or diverticulitis.   You pass bloody or black tarry stools.  MAKE SURE YOU:   Understand these instructions.   Will watch your condition.   Will get help right away if you are not doing well or get worse.  Document Released: 10/24/2004 Document Revised: 01/03/2011 Document Reviewed: 09/02/2007 [facility] Patient Information 2012 [place], [place].

## 2011-06-09 NOTE — ED Provider Notes (Signed)
History     CSN: [id number]  Arrival date & time 06/09/11  1130   First MD Initiated Contact with Patient 06/09/11 1149      Chief Complaint  Patient presents with  . Abdominal Pain    (Consider location/radiation/quality/duration/timing/severity/associated sxs/prior treatment) HPI History from patient. 21 year old female who presents with abdominal pain. 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 She states this started this morning when she woke up. Pain is sharp and located in the suprapubic area and right lower quadrant. It occasionally radiates up into the epigastrium. Feels consistent with her previous pain with her hepatitis. Has had one episode of vomiting. Denies diarrhea, urinary symptoms, vaginal discharge. Has not had any fever or chills. She is currently menstruating.  Past Medical History  Diagnosis Date  . Liver disease   . Hepatitis     Past Surgical History  Procedure Date  . Gastric banding port revision   . Tonsillectomy     No family history on file.  History  Substance Use Topics  . Smoking status: Current Everyday Smoker -- 0.5 packs/day  . Smokeless tobacco: Not on file  . Alcohol Use: No    OB History    Grav Para Term Preterm Abortions TAB SAB Ect Mult Living                  Review of Systems  Constitutional: Negative for fever and chills.  Respiratory: Negative for shortness of breath.   Cardiovascular: Negative for chest pain.  Gastrointestinal: Positive for nausea, vomiting and abdominal pain. Negative for diarrhea and constipation.  Genitourinary: Negative for dysuria, vaginal bleeding and vaginal discharge.  Musculoskeletal: Negative for myalgias.  Skin: Negative for rash.  Neurological: Negative for dizziness, weakness and numbness.    Allergies  Review of patient's allergies indicates no known allergies.  Home Medications   Current Outpatient Rx  Name  Route Sig Dispense Refill  . AZATHIOPRINE 50 MG PO TABS Oral Take 100 mg by mouth daily.     Marland Kitchen LACTULOSE 10 GM/15ML PO SOLN Oral Take 20 g by mouth 2 (two) times daily as needed. For constipation.    Marland Kitchen LEVOFLOXACIN 750 MG PO TABS Oral Take 750 mg by mouth once a week.     Marland Kitchen OMEPRAZOLE 20 MG PO CPDR Oral Take 20 mg by mouth daily.    Marland Kitchen PREDNISONE 5 MG PO TABS Oral Take 5 mg by mouth daily.    Marland Kitchen PROPRANOLOL HCL ER 60 MG PO CP24 Oral Take 60 mg by mouth daily.    Marland Kitchen SPIRONOLACTONE 25 MG PO TABS Oral Take 25 mg by mouth at bedtime.     Marland Kitchen URSODIOL 300 MG PO CAPS Oral Take 300 mg by mouth 2 (two) times daily.       BP 142/85  Pulse 83  Temp(Src) 97.8 F (36.6 C) (Oral)  Resp 20  SpO2 100%  Physical Exam  Nursing note and vitals reviewed. Constitutional: She appears well-developed and well-nourished. No distress.       Uncomfortable appearing  HENT:  Head: Normocephalic and atraumatic.  Neck: Normal range of motion.  Cardiovascular: Normal rate, regular rhythm and normal heart sounds.   Pulmonary/Chest: Effort normal and breath sounds normal. She exhibits no tenderness.  Abdominal: Soft. Bowel sounds are normal.       Tenderness and guarding to the suprapubic and right lower quadrant regions. No rebound.  Musculoskeletal: Normal range  of motion.  Neurological: She is alert.  Skin: Skin is warm and dry. She is not diaphoretic.  Psychiatric: She has a normal mood and affect.    ED Course  Procedures (including critical care time)  Labs Reviewed  COMPREHENSIVE METABOLIC PANEL - Abnormal; Notable for the following:    Potassium 3.4 (*)    CO2 18 (*)    Albumin 3.2 (*)    AST 170 (*)    ALT 134 (*)    Alkaline Phosphatase 951 (*)    Total Bilirubin 4.1 (*)    All other components within normal limits  URINALYSIS, ROUTINE W REFLEX MICROSCOPIC - Abnormal; Notable for the following:    Color, Urine RED (*) BIOCHEMICALS MAY BE AFFECTED BY COLOR   APPearance CLOUDY (*)    Hgb urine  dipstick LARGE (*)    Bilirubin Urine MODERATE (*)    Ketones, ur 15 (*)    Protein, ur 100 (*)    Leukocytes, UA SMALL (*)    All other components within normal limits  CBC - Abnormal; Notable for the following:    RDW 16.0 (*)    All other components within normal limits  POCT I-STAT, CHEM 8 - Abnormal; Notable for the following:    Potassium 3.3 (*)    Chloride 113 (*)    Calcium, Ion 1.02 (*)    All other components within normal limits  URINE MICROSCOPIC-ADD ON - Abnormal; Notable for the following:    Squamous Epithelial / LPF FEW (*)    All other components within normal limits  LIPASE, BLOOD  DIFFERENTIAL  POCT PREGNANCY, URINE   No results found.   1. Abdominal pain   2. Autoimmune hepatitis       MDM  Patient with history of hepatitis presents with abdominal pain, which she states feels consistent with her previous abd pain. She is afebrile and nontoxic appearing. Exam remarkable for suprapubic and right lower quadrant pain. No peritoneal signs. Patient's labs are consistent with previous. Her liver function tests are elevated, but are consistent with previous values in the chart. Patient was medicated with morphine. On repeat abdominal exam, she is much more comfortable. Her abdomen remained soft with minimal tenderness. Do not suspect surgical abdomen. Patient will be discharged home with instruction to followup with her physicians at [facility]. Return precautions discussed.  Discussed with Dr. Freida Busman.       Grant Fontana, [place] 06/09/11 1500

## 2011-06-09 NOTE — ED Notes (Signed)
Pelvic cart placed in room and given pt blanket.

## 2011-06-09 NOTE — ED Provider Notes (Signed)
Medical screening examination/treatment/procedure(s) were conducted as a shared visit with non-physician practitioner(s) and myself.  I personally evaluated the patient during the encounter  Patient's pain is similar to what she's had before in the past. Same locations in intensity. Abdomen without acute surgical intervention needed. Patient to be discharged to home with followup at [facility], MD 06/09/11 1440

## 2011-06-09 NOTE — ED Notes (Signed)
Pt undressed and placed in gown with pulse ox and blood pressure cuff continuous

## 2011-06-09 NOTE — ED Notes (Signed)
Abdominal pain began this am  Nauseated, denies any vomiting or diarrhea .

## 2011-06-10 NOTE — ED Provider Notes (Signed)
Medical screening examination/treatment/procedure(s) were performed by non-physician practitioner and as supervising physician I was immediately available for consultation/collaboration.  Cloie Wooden T Huxley Shurley, MD 06/10/11 1443 

## 2011-07-21 ENCOUNTER — Encounter (HOSPITAL_COMMUNITY): Payer: Self-pay | Admitting: Emergency Medicine

## 2011-07-21 DIAGNOSIS — K259 Gastric ulcer, unspecified as acute or chronic, without hemorrhage or perforation: Secondary | ICD-10-CM | POA: Diagnosis present

## 2011-07-21 DIAGNOSIS — K754 Autoimmune hepatitis: Secondary | ICD-10-CM | POA: Diagnosis present

## 2011-07-21 DIAGNOSIS — K766 Portal hypertension: Secondary | ICD-10-CM | POA: Diagnosis present

## 2011-07-21 DIAGNOSIS — K319 Disease of stomach and duodenum, unspecified: Secondary | ICD-10-CM | POA: Diagnosis present

## 2011-07-21 DIAGNOSIS — K922 Gastrointestinal hemorrhage, unspecified: Principal | ICD-10-CM | POA: Diagnosis present

## 2011-07-21 DIAGNOSIS — F17200 Nicotine dependence, unspecified, uncomplicated: Secondary | ICD-10-CM | POA: Diagnosis present

## 2011-07-21 NOTE — ED Notes (Signed)
Pt reports abd pain since Friday past with intermittant nausea, that has progressively worsened until today it is now unbearable. Pt further states she had a large bloody emesis tonight prior to coming to the ED

## 2011-07-22 ENCOUNTER — Encounter (HOSPITAL_COMMUNITY): Payer: Self-pay | Admitting: Internal Medicine

## 2011-07-22 ENCOUNTER — Inpatient Hospital Stay (HOSPITAL_COMMUNITY)
Admission: EM | Admit: 2011-07-22 | Discharge: 2011-07-23 | DRG: 378 | Discharge status: Against Medical Advice | Payer: Medicaid Other | Attending: Pulmonary Disease | Admitting: Pulmonary Disease

## 2011-07-22 ENCOUNTER — Encounter (HOSPITAL_COMMUNITY)
Admission: EM | Discharge status: Against Medical Advice | Payer: Self-pay | Source: Home / Self Care | Attending: Pulmonary Disease

## 2011-07-22 DIAGNOSIS — R740 Nonspecific elevation of levels of transaminase and lactic acid dehydrogenase [LDH]: Secondary | ICD-10-CM

## 2011-07-22 DIAGNOSIS — K922 Gastrointestinal hemorrhage, unspecified: Secondary | ICD-10-CM

## 2011-07-22 DIAGNOSIS — D649 Anemia, unspecified: Secondary | ICD-10-CM

## 2011-07-22 DIAGNOSIS — R112 Nausea with vomiting, unspecified: Secondary | ICD-10-CM

## 2011-07-22 DIAGNOSIS — K2901 Acute gastritis with bleeding: Secondary | ICD-10-CM

## 2011-07-22 HISTORY — PX: ESOPHAGOGASTRODUODENOSCOPY: SHX5428

## 2011-07-22 LAB — POCT I-STAT, CHEM 8
BUN: 24 mg/dL — ABNORMAL HIGH (ref 6–23)
Creatinine, Ser: 0.7 mg/dL (ref 0.50–1.10)
Potassium: 4.2 mEq/L (ref 3.5–5.1)
Sodium: 137 mEq/L (ref 135–145)

## 2011-07-22 LAB — DIFFERENTIAL
Blasts: 0 %
Metamyelocytes Relative: 0 %
Monocytes Absolute: 0.3 10*3/uL (ref 0.1–1.0)
Monocytes Relative: 4 % (ref 3–12)
Neutro Abs: 4.8 10*3/uL (ref 1.7–7.7)
Neutrophils Relative %: 73 % (ref 43–77)
nRBC: 0 /100 WBC

## 2011-07-22 LAB — CBC
HCT: 22 % — ABNORMAL LOW (ref 36.0–46.0)
Hemoglobin: 7.5 g/dL — ABNORMAL LOW (ref 12.0–15.0)
MCH: 31.5 pg (ref 26.0–34.0)
MCHC: 34.1 g/dL (ref 30.0–36.0)
MCV: 94.4 fL (ref 78.0–100.0)
MCV: 92.4 fL (ref 78.0–100.0)
Platelets: ADEQUATE 10*3/uL (ref 150–400)
RDW: 20.7 % — ABNORMAL HIGH (ref 11.5–15.5)
WBC: 6.6 10*3/uL (ref 4.0–10.5)

## 2011-07-22 LAB — URINE MICROSCOPIC-ADD ON

## 2011-07-22 LAB — HEPATIC FUNCTION PANEL
ALT: 108 U/L — ABNORMAL HIGH (ref 0–35)
AST: 118 U/L — ABNORMAL HIGH (ref 0–37)
Bilirubin, Direct: 2.6 mg/dL — ABNORMAL HIGH (ref 0.0–0.3)
Total Protein: 5.1 g/dL — ABNORMAL LOW (ref 6.0–8.3)

## 2011-07-22 LAB — URINALYSIS, ROUTINE W REFLEX MICROSCOPIC
Glucose, UA: NEGATIVE mg/dL
Hgb urine dipstick: NEGATIVE
Specific Gravity, Urine: 1.022 (ref 1.005–1.030)

## 2011-07-22 LAB — PROTIME-INR: Prothrombin Time: 20.4 seconds — ABNORMAL HIGH (ref 11.6–15.2)

## 2011-07-22 SURGERY — EGD (ESOPHAGOGASTRODUODENOSCOPY)
Anesthesia: Moderate Sedation

## 2011-07-22 MED ORDER — DIPHENHYDRAMINE HCL 50 MG/ML IJ SOLN
INTRAMUSCULAR | Status: AC
  Filled 2011-07-22: qty 1

## 2011-07-22 MED ORDER — SODIUM CHLORIDE 0.9 % IV SOLN
80.0000 mg | Freq: Once | INTRAVENOUS | Status: AC
  Administered 2011-07-22: 80 mg via INTRAVENOUS
  Filled 2011-07-22: qty 80

## 2011-07-22 MED ORDER — SODIUM CHLORIDE 0.9 % IJ SOLN
3.0000 mL | Freq: Two times a day (BID) | INTRAMUSCULAR | Status: DC
  Administered 2011-07-22 – 2011-07-23 (×4): 3 mL via INTRAVENOUS

## 2011-07-22 MED ORDER — SODIUM CHLORIDE 0.9 % IV SOLN: Freq: Once | INTRAVENOUS | Status: DC

## 2011-07-22 MED ORDER — MORPHINE SULFATE 4 MG/ML IJ SOLN
4.0000 mg | Freq: Once | INTRAMUSCULAR | Status: AC
  Administered 2011-07-22: 4 mg via INTRAVENOUS

## 2011-07-22 MED ORDER — LEVOFLOXACIN 750 MG PO TABS
750.0000 mg | ORAL_TABLET | ORAL | Status: DC
  Filled 2011-07-22: qty 1

## 2011-07-22 MED ORDER — SODIUM CHLORIDE 0.9 % IV BOLUS (SEPSIS)
2000.0000 mL | Freq: Once | INTRAVENOUS | Status: AC
  Administered 2011-07-22: 2000 mL via INTRAVENOUS

## 2011-07-22 MED ORDER — ACETAMINOPHEN 325 MG PO TABS: 650.0000 mg | ORAL_TABLET | Freq: Once | ORAL | Status: DC

## 2011-07-22 MED ORDER — OCTREOTIDE LOAD VIA INFUSION
50.0000 ug | Freq: Once | INTRAVENOUS | Status: AC
  Administered 2011-07-22: 50 ug via INTRAVENOUS
  Filled 2011-07-22: qty 25

## 2011-07-22 MED ORDER — FENTANYL CITRATE 0.05 MG/ML IJ SOLN
INTRAMUSCULAR | Status: AC
  Filled 2011-07-22: qty 2

## 2011-07-22 MED ORDER — SODIUM CHLORIDE 0.9 % IV SOLN
INTRAVENOUS | Status: DC
  Administered 2011-07-22: 1000 mL via INTRAVENOUS
  Administered 2011-07-22: 05:00:00 via INTRAVENOUS

## 2011-07-22 MED ORDER — MORPHINE SULFATE 4 MG/ML IJ SOLN
INTRAMUSCULAR | Status: AC
  Filled 2011-07-22: qty 1

## 2011-07-22 MED ORDER — MORPHINE SULFATE 4 MG/ML IJ SOLN
4.0000 mg | Freq: Once | INTRAMUSCULAR | Status: AC
  Administered 2011-07-22: 4 mg via INTRAVENOUS
  Filled 2011-07-22: qty 1

## 2011-07-22 MED ORDER — AZATHIOPRINE 50 MG PO TABS
100.0000 mg | ORAL_TABLET | Freq: Every day | ORAL | Status: DC
  Filled 2011-07-22: qty 2

## 2011-07-22 MED ORDER — SODIUM CHLORIDE 0.9 % IV SOLN
250.0000 mL | INTRAVENOUS | Status: DC | PRN
Start: 2011-07-22 — End: 2011-07-24

## 2011-07-22 MED ORDER — BUTAMBEN-TETRACAINE-BENZOCAINE 2-2-14 % EX AERO
INHALATION_SPRAY | CUTANEOUS | Status: DC | PRN
  Administered 2011-07-22: 2 via TOPICAL

## 2011-07-22 MED ORDER — SODIUM CHLORIDE 0.9 % IV SOLN
50.0000 ug/h | INTRAVENOUS | Status: DC
  Administered 2011-07-22 (×2): 50 ug/h via INTRAVENOUS
  Filled 2011-07-22 (×8): qty 1

## 2011-07-22 MED ORDER — LACTULOSE 10 GM/15ML PO SOLN
20.0000 g | Freq: Two times a day (BID) | ORAL | Status: DC | PRN
  Filled 2011-07-22: qty 30

## 2011-07-22 MED ORDER — SODIUM CHLORIDE 0.9 % IV SOLN
8.0000 mg/h | INTRAVENOUS | Status: DC
  Administered 2011-07-22: 8 mg/h via INTRAVENOUS
  Filled 2011-07-22 (×7): qty 80

## 2011-07-22 MED ORDER — VITAMIN K1 10 MG/ML IJ SOLN
10.0000 mg | Freq: Once | INTRAVENOUS | Status: AC
  Administered 2011-07-22: 10 mg via INTRAVENOUS
  Filled 2011-07-22: qty 1

## 2011-07-22 MED ORDER — PHENYLEPHRINE HCL 10 MG/ML IJ SOLN
30.0000 ug/min | INTRAVENOUS | Status: DC
  Administered 2011-07-22: 30 ug/min via INTRAVENOUS
  Filled 2011-07-22 (×2): qty 1

## 2011-07-22 MED ORDER — SODIUM CHLORIDE 0.9 % IV BOLUS (SEPSIS)
1000.0000 mL | Freq: Once | INTRAVENOUS | Status: AC
  Administered 2011-07-22: 1000 mL via INTRAVENOUS

## 2011-07-22 MED ORDER — MIDAZOLAM HCL 10 MG/2ML IJ SOLN
INTRAMUSCULAR | Status: AC
  Filled 2011-07-22: qty 2

## 2011-07-22 MED ORDER — DEXTROSE 5 % IV SOLN
1.0000 g | INTRAVENOUS | Status: DC
  Administered 2011-07-22 – 2011-07-23 (×2): 1 g via INTRAVENOUS
  Filled 2011-07-22 (×2): qty 10

## 2011-07-22 MED ORDER — MIDAZOLAM HCL 10 MG/2ML IJ SOLN
INTRAMUSCULAR | Status: DC | PRN
  Administered 2011-07-22: 1 mg via INTRAVENOUS
  Administered 2011-07-22: 2 mg via INTRAVENOUS
  Administered 2011-07-22: 1 mg via INTRAVENOUS
  Administered 2011-07-22: 2 mg via INTRAVENOUS

## 2011-07-22 MED ORDER — HYDROCORTISONE SOD SUCCINATE 100 MG IJ SOLR
50.0000 mg | Freq: Three times a day (TID) | INTRAMUSCULAR | Status: DC
  Administered 2011-07-22 – 2011-07-23 (×4): 50 mg via INTRAVENOUS
  Filled 2011-07-22 (×7): qty 1

## 2011-07-22 MED ORDER — URSODIOL 300 MG PO CAPS
300.0000 mg | ORAL_CAPSULE | Freq: Two times a day (BID) | ORAL | Status: DC
  Administered 2011-07-22 – 2011-07-23 (×2): 300 mg via ORAL
  Filled 2011-07-22 (×5): qty 1

## 2011-07-22 MED ORDER — ONDANSETRON HCL 4 MG/2ML IJ SOLN
4.0000 mg | Freq: Once | INTRAMUSCULAR | Status: AC
  Administered 2011-07-22: 4 mg via INTRAVENOUS
  Filled 2011-07-22: qty 2

## 2011-07-22 MED ORDER — PREDNISONE 5 MG PO TABS
5.0000 mg | ORAL_TABLET | Freq: Every day | ORAL | Status: DC
  Filled 2011-07-22: qty 1

## 2011-07-22 MED ORDER — FENTANYL NICU IV SYRINGE 50 MCG/ML
INJECTION | INTRAMUSCULAR | Status: DC | PRN
  Administered 2011-07-22 (×3): 12.5 ug via INTRAVENOUS
  Administered 2011-07-22: 25 ug via INTRAVENOUS

## 2011-07-22 NOTE — ED Notes (Signed)
Pharmacy called for protonix. 

## 2011-07-22 NOTE — H&P (Signed)
Melanie Conley is an 21 y.o. female.   Chief Complaint: gi bleeding HPI: 21 yo female with liver disease (uncertain what etiology), has c/o n/v x2 ,  The second episode was associated with blood emesis, about 1/2 cup according to the patient, pt notes silght epigastric discomfort.  pt denies fever, chills, cough, diarrhea, constipation, black stool,   Past Medical History  Diagnosis Date  . Liver disease   . Hepatitis     Past Surgical History  Procedure Date  . Gastric banding port revision   . Tonsillectomy     History reviewed. No pertinent family history. Social History:  reports that she has been smoking Cigarettes.  She has a 1 pack-year smoking history. She does not have any smokeless tobacco history on file. She reports that she does not drink alcohol or use illicit drugs.  Allergies: No Known Allergies   (Not in a hospital admission)  Results for orders placed during the hospital encounter of 07/22/11 (from the past 48 hour(s))  CBC     Status: Abnormal   Collection Time   07/22/11 12:51 AM      Component Value Range Comment   WBC 6.6  4.0 - 10.5 K/uL WHITE COUNT CONFIRMED ON SMEAR   RBC 2.32 (*) 3.87 - 5.11 MIL/uL    Hemoglobin 7.4 (*) 12.0 - 15.0 g/dL    HCT 16.1 (*) 09.6 - 46.0 %    MCV 94.4  78.0 - 100.0 fL    MCH 31.9  26.0 - 34.0 pg    MCHC 33.8  30.0 - 36.0 g/dL    RDW 04.5 (*) 40.9 - 15.5 %    Platelets    150 - 400 K/uL    Value: PLATELET CLUMPS NOTED ON SMEAR, COUNT APPEARS ADEQUATE  DIFFERENTIAL     Status: Normal   Collection Time   07/22/11 12:51 AM      Component Value Range Comment   Neutrophils Relative 73  43 - 77 %    Lymphocytes Relative 23  12 - 46 %    Monocytes Relative 4  3 - 12 %    Eosinophils Relative 0  0 - 5 %    Basophils Relative 0  0 - 1 %    Band Neutrophils 0  0 - 10 %    Metamyelocytes Relative 0      Myelocytes 0      Promyelocytes Absolute 0      Blasts 0      nRBC 0  0 /100 WBC    Neutro Abs 4.8  1.7 - 7.7 K/uL    Lymphs Abs 1.5  0.7 - 4.0 K/uL    Monocytes Absolute 0.3  0.1 - 1.0 K/uL    Eosinophils Absolute 0.0  0.0 - 0.7 K/uL    Basophils Absolute 0.0  0.0 - 0.1 K/uL    RBC Morphology POLYCHROMASIA PRESENT   TEARDROP CELLS   Smear Review RARE NRBCs     HEPATIC FUNCTION PANEL     Status: Abnormal   Collection Time   07/22/11 12:51 AM      Component Value Range Comment   Total Protein 5.1 (*) 6.0 - 8.3 g/dL    Albumin 2.4 (*) 3.5 - 5.2 g/dL    AST 811 (*) 0 - 37 U/L    ALT 108 (*) 0 - 35 U/L    Alkaline Phosphatase 579 (*) 39 - 117 U/L    Total Bilirubin 4.4 (*) 0.3 - 1.2  mg/dL    Bilirubin, Direct 2.6 (*) 0.0 - 0.3 mg/dL    Indirect Bilirubin 1.8 (*) 0.3 - 0.9 mg/dL   LIPASE, BLOOD     Status: Normal   Collection Time   07/22/11 12:51 AM      Component Value Range Comment   Lipase 31  11 - 59 U/L   URINALYSIS, ROUTINE W REFLEX MICROSCOPIC     Status: Abnormal   Collection Time   07/22/11 12:54 AM      Component Value Range Comment   Color, Urine AMBER (*) YELLOW BIOCHEMICALS MAY BE AFFECTED BY COLOR   APPearance CLOUDY (*) CLEAR    Specific Gravity, Urine 1.022  1.005 - 1.030    pH 8.0  5.0 - 8.0    Glucose, UA NEGATIVE  NEGATIVE mg/dL    Hgb urine dipstick NEGATIVE  NEGATIVE    Bilirubin Urine SMALL (*) NEGATIVE    Ketones, ur 15 (*) NEGATIVE mg/dL    Protein, ur NEGATIVE  NEGATIVE mg/dL    Urobilinogen, UA 1.0  0.0 - 1.0 mg/dL    Nitrite NEGATIVE  NEGATIVE    Leukocytes, UA TRACE (*) NEGATIVE   URINE MICROSCOPIC-ADD ON     Status: Abnormal   Collection Time   07/22/11 12:54 AM      Component Value Range Comment   Squamous Epithelial / LPF MANY (*) RARE    WBC, UA 3-6  <3 WBC/hpf    RBC / HPF 0-2  <3 RBC/hpf    Bacteria, UA FEW (*) RARE    Urine-Other MUCOUS PRESENT     POCT I-STAT, CHEM 8     Status: Abnormal   Collection Time   07/22/11  1:23 AM      Component Value Range Comment   Sodium 137  135 - 145 mEq/L    Potassium 4.2  3.5 - 5.1 mEq/L    Chloride 106  96 - 112  mEq/L    BUN 24 (*) 6 - 23 mg/dL    Creatinine, Ser 2.95  0.50 - 1.10 mg/dL    Glucose, Bld 621 (*) 70 - 99 mg/dL    Calcium, Ion 3.08  6.57 - 1.32 mmol/L    TCO2 20  0 - 100 mmol/L    Hemoglobin 7.8 (*) 12.0 - 15.0 g/dL    HCT 84.6 (*) 96.2 - 46.0 %   POCT PREGNANCY, URINE     Status: Normal   Collection Time   07/22/11  1:24 AM      Component Value Range Comment   Preg Test, Ur NEGATIVE  NEGATIVE   OCCULT BLOOD, POC DEVICE     Status: Normal   Collection Time   07/22/11  3:07 AM      Component Value Range Comment   Fecal Occult Bld POSITIVE      No results found.  Review of Systems  Constitutional: Negative for fever, chills, weight loss, malaise/fatigue and diaphoresis.  HENT: Negative for hearing loss, ear pain, nosebleeds, neck pain, tinnitus and ear discharge.   Eyes: Negative for blurred vision, double vision, photophobia, pain, discharge and redness.  Respiratory: Negative for cough, hemoptysis, sputum production, shortness of breath and wheezing.   Cardiovascular: Negative for chest pain, palpitations, orthopnea, claudication, leg swelling and PND.  Gastrointestinal: Positive for nausea, vomiting and abdominal pain. Negative for heartburn, diarrhea, constipation, blood in stool and melena.  Genitourinary: Negative for dysuria, urgency, frequency, hematuria and flank pain.  Musculoskeletal: Negative for myalgias, back pain, joint pain  and falls.  Skin: Negative for itching and rash.  Neurological: Negative for dizziness, tingling, tremors, sensory change, speech change, focal weakness, seizures, loss of consciousness, weakness and headaches.  Endo/Heme/Allergies: Negative for environmental allergies and polydipsia. Does not bruise/bleed easily.  Psychiatric/Behavioral: Negative for depression, suicidal ideas, hallucinations, memory loss and substance abuse. The patient is not nervous/anxious and does not have insomnia.     Blood pressure 96/39, pulse 91, temperature 99 F  (37.2 C), temperature source Oral, resp. rate 18, SpO2 100.00%. Physical Exam  Constitutional: She is oriented to person, place, and time. She appears well-developed and well-nourished. No distress.  HENT:  Head: Normocephalic and atraumatic.  Mouth/Throat: No oropharyngeal exudate.  Eyes: Conjunctivae are normal. Pupils are equal, round, and reactive to light. Right eye exhibits no discharge. Left eye exhibits no discharge. No scleral icterus.  Neck: Normal range of motion. Neck supple. No JVD present. No tracheal deviation present. No thyromegaly present.  Cardiovascular: Normal rate and regular rhythm.  Exam reveals no gallop and no friction rub.   Murmur heard. Respiratory: Breath sounds normal. No stridor. No respiratory distress. She has no wheezes. She has no rales. She exhibits no tenderness.  GI: Soft. Bowel sounds are normal. She exhibits no distension and no mass. There is no tenderness. There is no rebound and no guarding.  Musculoskeletal: Normal range of motion. She exhibits no edema and no tenderness.  Lymphadenopathy:    She has no cervical adenopathy.  Neurological: She is alert and oriented to person, place, and time. She has normal reflexes. She displays normal reflexes. No cranial nerve deficit. She exhibits normal muscle tone. Coordination normal.  Skin: Skin is warm and dry. No rash noted. She is not diaphoretic. No erythema. No pallor.  Psychiatric: She has a normal mood and affect. Her behavior is normal. Judgment and thought content normal.     Assessment/Plan GI bleeding Anemia Hepatitis/Abnormal lft  Pt, ptt Type and cross 2 units prbc Transfuse 2 units prbc Protonix IV NPO GI consult Acute hepatitis panel Please expand data base in am , apparently sees GI at [facility] to find out what etiology of her liver dysfunction    Pearson Grippe 07/22/2011, 3:20 AM

## 2011-07-22 NOTE — Progress Notes (Signed)
Bedside EGD

## 2011-07-22 NOTE — Progress Notes (Addendum)
Pt heart rate bradycardic in low 50's.  Called [facility]; spoke with Maralyn Sago who will make Dr Delton Coombes aware.  Pt BP stable and mental status good; follows verbal commands and is able to pull herself up in bed.

## 2011-07-22 NOTE — Op Note (Signed)
[name] [address] [place], [place]  [address]  ENDOSCOPY PROCEDURE REPORT  PATIENT:  Melanie Conley, Melanie Conley  MR#:  [id number] BIRTHDATE:  Feb 07, 1990, 20 yrs. old  GENDER:  female  ENDOSCOPIST:  Wandalee Ferdinand, MD Referred by:  PROCEDURE DATE:  07/22/2011 PROCEDURE: EGD ASA CLASS: 3 INDICATIONS: Upper GI bleed in a patient with a history of liver disease and prior esophageal varices  MEDICATIONS: Fentanyl 62.5 mcg IV, Versed 6 mg IV  TOPICAL ANESTHETIC: Cetacaine spray  DESCRIPTION OF PROCEDURE:   After the risks benefits and alternatives of the procedure were thoroughly explained, informed consent was obtained.  The Pentax Gastroscope [id number] endoscope was introduced through the mouth and advanced to the second portion of the duodenum , without limitations.  The instrument was slowly withdrawn as the mucosa was fully examined. <<PROCEDUREIMAGES>>  FINDINGS: Esophagus: Scarring of the distal and midesophagus from prior esophageal variceal banding as seen on image is 11 and 12. No evidence of varices. There is a 5 mm ulcer at the esophagogastric junction as seen on image 10.  Stomach: Portal hypertensive gastropathy, no gastric varices, no active bleeding.  Duodenum: The duodenal bulb looked normal, there are 2 post bulbar small ulcerations as seen on image #3.  COMPLICATIONS: None  ENDOSCOPIC IMPRESSION: See above  RECOMMENDATIONS: Continue PPI therapy. Continue octreotide for now. Follow clinically.  ______________________________ Wandalee Ferdinand, MD  CC:  n. eSIGNED:   [name] at 07/22/2011 12:17 PM  [name], [phone]

## 2011-07-22 NOTE — Consult Note (Signed)
Name: Melanie Conley MRN: [id number] DOB: 1990-03-16    LOS: 0  PULMONARY / CRITICAL CARE MEDICINE  HPI:  21 year old female with PMH relevant for chronic liver disease of unclear etiology. Apparently the patient gets her care [facility] but does not know her diagnosis. She is currently on imuran. Presents with 3 days history of epigastric abdominal pain. Today she had two episodes of vomit and the last one was bloody. In the ER she had an episode of melena. PCCM consulted by [facility] on tx to ICU.   Vital Signs: Temp:  [98.2 F (36.8 C)-99 F (37.2 C)] 98.3 F (36.8 C) (06/24 1053) Pulse Rate:  [61-115] 69  (06/24 1100) Resp:  [14-24] 20  (06/24 1100) BP: (85-110)/(36-77) 108/50 mmHg (06/24 1100) SpO2:  [99 %-100 %] 99 % (06/24 1100) Weight:  [107 lb 12.9 oz (48.9 kg)] 107 lb 12.9 oz (48.9 kg) (06/24 0445)  Physical Examination: General: frail, chronically ill appearing young female, NAD Neuro: sedate during endoscopy, opens eyes to name, MAE CV: s1s2 rrr PULM: resps even non labored on RA, cta GI: abd soft, +bs, mild mid abd tenderness Extremities:  Warm and dry, no edema    Principal Problem:  *GI bleeding   ASSESSMENT AND PLAN   Lab 07/22/11 0051  AST 118*  ALT 108*  ALKPHOS 579*  BILITOT 4.4*  PROT 5.1*  ALBUMIN 2.4*    A:  Acute upper GI bleed. Unclear if related to esophageal varices Chronic liver disease (autoimmune - followed at [facility] ([facility])) P:    GI following - scoping currently   volume with blood, fluids   Rocephin per pharmacy consult  Sandostatin, protonix  drip  hold propanolol and Aldactone  Continue Imuran Vit K given  Cont lactulose   F/u coags    Anemia related to blood loss  Lab 07/22/11 0123 07/22/11 0051  HGB 7.8* 7.4*   P:  Source control - see above  F/u cbc q6 Transfuse PRBC as prev ordered    BEST PRACTICE / DISPOSITION - Level of Care:  ICU - Primary Service:  Critical care - Consultants:  GI - Code Status:  Full code -  Diet:  NPO - DVT Px:  SCD's - GI Px:  Protonix drip - Skin Integrity:  Intact  WHITEHEART,KATHRYN, NP 07/22/2011  11:23 AM Pager: [phone]   Billy Fischer, MD ; [facility] service Mobile [phone].  After 5:30 PM or weekends, call [phone]

## 2011-07-22 NOTE — ED Provider Notes (Signed)
History     CSN: [id number]  Arrival date & time 07/21/11  2345   First MD Initiated Contact with Patient 07/22/11 0044      Chief Complaint  Patient presents with  . Abdominal Pain  . Hematemesis    (Consider location/radiation/quality/duration/timing/severity/associated sxs/prior treatment) Patient is a 21 y.o. female presenting with abdominal pain. The history is provided by the patient.  Abdominal Pain The primary symptoms of the illness include abdominal pain, nausea, vomiting and hematemesis. The primary symptoms of the illness do not include diarrhea or hematochezia. The current episode started yesterday. The onset of the illness was gradual. The problem has not changed since onset. The abdominal pain began yesterday. The pain came on gradually. The abdominal pain has been unchanged since its onset. The abdominal pain is generalized. The abdominal pain does not radiate. The severity of the abdominal pain is 10/10. The abdominal pain is relieved by nothing. The abdominal pain is exacerbated by vomiting.  The vomiting began yesterday. Vomiting occurs 2 to 5 times per day. The emesis contains stomach contents and bright red blood.  The patient states that she believes she is currently not pregnant. The patient has not had a change in bowel habit. Additional symptoms associated with the illness include back pain. Significant associated medical issues include liver disease.    Past Medical History  Diagnosis Date  . Liver disease   . Hepatitis     Past Surgical History  Procedure Date  . Gastric banding port revision   . Tonsillectomy     History reviewed. No pertinent family history.  History  Substance Use Topics  . Smoking status: Current Some Day Smoker -- 0.5 packs/day for 2 years    Types: Cigarettes  . Smokeless tobacco: Not on file  . Alcohol Use: No    OB History    Grav Para Term Preterm Abortions TAB SAB Ect Mult Living                  Review of  Systems  Gastrointestinal: Positive for nausea, vomiting, abdominal pain and hematemesis. Negative for diarrhea, blood in stool and hematochezia.  Musculoskeletal: Positive for back pain.  All other systems reviewed and are negative.    Allergies  Review of patient's allergies indicates no known allergies.  Home Medications   Current Outpatient Rx  Name Route Sig Dispense Refill  . AZATHIOPRINE 50 MG PO TABS Oral Take 100 mg by mouth daily.     Marland Kitchen LACTULOSE 10 GM/15ML PO SOLN Oral Take 20 g by mouth 2 (two) times daily as needed. For constipation.    Marland Kitchen LEVOFLOXACIN 750 MG PO TABS Oral Take 750 mg by mouth once a week.     Marland Kitchen OMEPRAZOLE 20 MG PO CPDR Oral Take 20 mg by mouth daily.    Marland Kitchen PREDNISONE 5 MG PO TABS Oral Take 5 mg by mouth daily.    Marland Kitchen PROPRANOLOL HCL ER 60 MG PO CP24 Oral Take 60 mg by mouth daily.    Marland Kitchen SPIRONOLACTONE 25 MG PO TABS Oral Take 25 mg by mouth at bedtime.     Marland Kitchen URSODIOL 300 MG PO CAPS Oral Take 300 mg by mouth 2 (two) times daily.       BP 96/39  Pulse 91  Temp 99 F (37.2 C) (Oral)  Resp 18  SpO2 100%  Physical Exam  Constitutional: She is oriented to person, place, and time. She appears well-developed and well-nourished. No distress.  HENT:  Head:  Normocephalic and atraumatic.  Mouth/Throat: Oropharynx is clear and moist.  Eyes: Pupils are equal, round, and reactive to light.       Pale conjunctiva  Neck: Normal range of motion. Neck supple.  Cardiovascular: Normal rate and regular rhythm.   Pulmonary/Chest: Effort normal and breath sounds normal. She has no wheezes. She has no rales.  Abdominal: Soft. Bowel sounds are normal. There is generalized tenderness.  Genitourinary: Guaiac positive stool.  Musculoskeletal: Normal range of motion. She exhibits no edema.  Neurological: She is alert and oriented to person, place, and time.  Skin: Skin is warm and dry. There is pallor.  Psychiatric: She has a normal mood and affect.    ED Course    Procedures (including critical care time)  Labs Reviewed  CBC - Abnormal; Notable for the following:    RBC 2.32 (*)     Hemoglobin 7.4 (*)     HCT 21.9 (*)     RDW 20.7 (*)     All other components within normal limits  HEPATIC FUNCTION PANEL - Abnormal; Notable for the following:    Total Protein 5.1 (*)     Albumin 2.4 (*)     AST 118 (*)     ALT 108 (*)     Alkaline Phosphatase 579 (*)     Total Bilirubin 4.4 (*)     Bilirubin, Direct 2.6 (*)     Indirect Bilirubin 1.8 (*)     All other components within normal limits  URINALYSIS, ROUTINE W REFLEX MICROSCOPIC - Abnormal; Notable for the following:    Color, Urine AMBER (*)  BIOCHEMICALS MAY BE AFFECTED BY COLOR   APPearance CLOUDY (*)     Bilirubin Urine SMALL (*)     Ketones, ur 15 (*)     Leukocytes, UA TRACE (*)     All other components within normal limits  POCT I-STAT, CHEM 8 - Abnormal; Notable for the following:    BUN 24 (*)     Glucose, Bld 122 (*)     Hemoglobin 7.8 (*)     HCT 23.0 (*)     All other components within normal limits  URINE MICROSCOPIC-ADD ON - Abnormal; Notable for the following:    Squamous Epithelial / LPF MANY (*)     Bacteria, UA FEW (*)     All other components within normal limits  DIFFERENTIAL  LIPASE, BLOOD  POCT PREGNANCY, URINE  OCCULT BLOOD, POC DEVICE  TYPE AND SCREEN   No results found.   No diagnosis found.    MDM  Will admit due to 7 gram drop in Hb.  Will fluid resuscitate with IVF and will require transfusion       Keeghan Bialy K Tora Prunty-Rasch, MD 07/22/11 [phone]

## 2011-07-22 NOTE — Progress Notes (Signed)
eLink Physician-Brief Progress Note Patient Name: Melanie Conley DOB: 09/06/1990 MRN: [id number]  Date of Service  07/22/2011   HPI/Events of Note   INR 1.7  eICU Interventions  Vit K IV - consider FFP if further bleeding Dc prednisone, imuran - add stress dose hydrocort   Intervention Category Intermediate Interventions: Diagnostic test evaluation  Darrell Hauk V. 07/22/2011, 5:39 AM

## 2011-07-22 NOTE — ED Notes (Signed)
Dr Dede Query at bedside.

## 2011-07-22 NOTE — Consult Note (Signed)
Subjective:   HPI  The patient is a 21 year old female who we are asked to see in consultation in regards to GI bleeding. She is not a very good historian and states that she has liver disease. She is followed at [facility] for her liver disease. I called over and spoke to the liver department at [facility] to find out a little bit more about her and it turns out that she has autoimmune hepatitis with PSC overlap syndrome. She has had esophageal variceal banding on several occasions in the past and the last time was in August 2012. She states that 3 days ago she noticed a little blood in her stool, and today vomited blood which was dark red in character and has passed blood in her stool today also dark red in character. She is anemic and her hemoglobin has dropped. She also complains of upper and mid abdominal pain which has been going on for the last few days.  Review of Systems Denies chest pain or shortness of breath  Past Medical History  Diagnosis Date  . Liver disease   . Hepatitis    Past Surgical History  Procedure Date  . Gastric banding port revision   . Tonsillectomy    History   Social History  . Marital Status: Single    Spouse Name: N/A    Number of Children: N/A  . Years of Education: N/A   Occupational History  . Not on file.   Social History Main Topics  . Smoking status: Current Some Day Smoker -- 0.5 packs/day for 2 years    Types: Cigarettes  . Smokeless tobacco: Not on file  . Alcohol Use: No  . Drug Use: No  . Sexually Active: Not on file   Other Topics Concern  . Not on file   Social History Narrative   Adopted so doesn't know family history   family history is not on file. Current facility-administered medications:0.9 %  sodium chloride infusion, , Intravenous, Continuous, Massie Maroon, MD, Last Rate: 100 mL/hr at 07/22/11 0500;  0.9 %  sodium chloride infusion, 250 mL, Intravenous, PRN, Overton Mam, MD;  cefTRIAXone (ROCEPHIN) 1 g in dextrose  5 % 50 mL IVPB, 1 g, Intravenous, Q24H, Lonia Blood, MD, 1 g at 07/22/11 0600 hydrocortisone sodium succinate (SOLU-CORTEF) 100 mg/2 mL injection 50 mg, 50 mg, Intravenous, Q8H, Oretha Milch, MD, 50 mg at 07/22/11 0630;  lactulose (CHRONULAC) 10 GM/15ML solution 20 g, 20 g, Oral, BID PRN, Massie Maroon, MD;  morphine 4 MG/ML injection 4 mg, 4 mg, Intravenous, Once, April K Palumbo-Rasch, MD, 4 mg at 07/22/11 0124 octreotide (SANDOSTATIN) 2 mcg/mL in sodium chloride 0.9 % 250 mL infusion, 50 mcg/hr, Intravenous, Continuous, Graylin Shiver, MD;  octreotide (SANDOSTATIN) 2 mcg/mL load via infusion 50 mcg, 50 mcg, Intravenous, Once, Graylin Shiver, MD;  ondansetron [facility]) injection 4 mg, 4 mg, Intravenous, Once, April K Palumbo-Rasch, MD, 4 mg at 07/22/11 0122 pantoprazole (PROTONIX) 80 mg in sodium chloride 0.9 % 100 mL IVPB, 80 mg, Intravenous, Once, Massie Maroon, MD, 80 mg at 07/22/11 0422;  pantoprazole (PROTONIX) 80 mg in sodium chloride 0.9 % 250 mL infusion, 8 mg/hr, Intravenous, Continuous, Massie Maroon, MD, Last Rate: 25 mL/hr at 07/22/11 0431, 8 mg/hr at 07/22/11 0431 phenylephrine (NEO-SYNEPHRINE) 10,000 mcg in dextrose 5 % 250 mL infusion, 30-200 mcg/min, Intravenous, Titrated, Kalman Shan, MD, Last Rate: 45 mL/hr at 07/22/11 0845, 30 mcg/min at 07/22/11 0845;  phytonadione (VITAMIN K) 10 mg in dextrose 5 % 50 mL IVPB, 10 mg, Intravenous, Once, Oretha Milch, MD, 10 mg at 07/22/11 0630;  sodium chloride 0.9 % bolus 1,000 mL, 1,000 mL, Intravenous, Once, Overton Mam, MD, 1,000 mL at 07/22/11 0500 sodium chloride 0.9 % bolus 2,000 mL, 2,000 mL, Intravenous, Once, April K Palumbo-Rasch, MD, 2,000 mL at 07/22/11 0302;  sodium chloride 0.9 % injection 3 mL, 3 mL, Intravenous, Q12H, Massie Maroon, MD, 3 mL at 07/22/11 0424;  ursodiol (ACTIGALL) capsule 300 mg, 300 mg, Oral, BID, Massie Maroon, MD;  DISCONTD: acetaminophen (TYLENOL) tablet 650 mg, 650 mg, Oral, Once, Massie Maroon, MD DISCONTD:  azaTHIOprine [facility]) tablet 100 mg, 100 mg, Oral, Daily, Massie Maroon, MD;  DISCONTD: levofloxacin [facility]) tablet 750 mg, 750 mg, Oral, Weekly, Massie Maroon, MD;  DISCONTD: predniSONE (DELTASONE) tablet 5 mg, 5 mg, Oral, Daily, Massie Maroon, MD No Known Allergies   Objective:     BP 85/41  Pulse 89  Temp 98.5 F (36.9 C) (Oral)  Resp 17  Ht 4\' 11"  (1.499 m)  Wt 48.9 kg (107 lb 12.9 oz)  BMI 21.77 kg/m2  SpO2 100%  She is alert and oriented and in no acute distress  She does not appear jaundiced  Heart regular rhythm no murmurs  Lungs clear  Abdomen reveals normal bowel sounds it is soft and there is some mid abdominal tenderness but no rebound or guarding.  Laboratory No components found with this basename: d1      Assessment:     #1 upper GI bleed  #2 history of autoimmune hepatitis with PSC overlap syndrome  #3 history of esophageal varices      Plan:     Blood transfusion as needed. Fresh frozen plasma. Proceed with EGD later this morning with possible esophageal banding. The patient has received a dose of Rocephin this morning. Begin octreotide. Lab Results  Component Value Date   HGB 7.8* 07/22/2011   HGB 7.4* 07/22/2011   HGB 14.3 06/09/2011   HCT 23.0* 07/22/2011   HCT 21.9* 07/22/2011   HCT 42.0 06/09/2011   ALKPHOS 579* 07/22/2011   ALKPHOS 951* 06/09/2011   ALKPHOS 416* 04/13/2011   AST 118* 07/22/2011   AST 170* 06/09/2011   AST 83* 04/13/2011   ALT 108* 07/22/2011   ALT 134* 06/09/2011   ALT 70* 04/13/2011

## 2011-07-22 NOTE — Consult Note (Signed)
Name: Melanie Conley MRN: [id number] DOB: February 11, 1990    LOS: 0  PULMONARY / CRITICAL CARE MEDICINE  HPI:  21 year old female with PMH relevant for chronic liver disease of unclear etiology. Apparently the patient gets her care [facility] but does not know her diagnosis. She is currently on imuran. Presents with 3 days history of epigastric abdominal pain. Today she had two episodes of vomit and the last one was bloody. In the ER she had an episode of melena. At admission to the ER awake, alert, oriented x 3. Hgb of 7.4. MAP 55 to 65.   Past Medical History  Diagnosis Date  . Liver disease   . Hepatitis    Past Surgical History  Procedure Date  . Gastric banding port revision   . Tonsillectomy    Prior to Admission medications   Medication Sig Start Date End Date Taking? Authorizing Provider  azaTHIOprine (IMURAN) 50 MG tablet Take 100 mg by mouth daily.    Yes Historical Provider, MD  lactulose (CHRONULAC) 10 GM/15ML solution Take 20 g by mouth 2 (two) times daily as needed. For constipation.   Yes Historical Provider, MD  levofloxacin (LEVAQUIN) 750 MG tablet Take 750 mg by mouth once a week.    Yes Historical Provider, MD  omeprazole (PRILOSEC) 20 MG capsule Take 20 mg by mouth daily.   Yes Historical Provider, MD  predniSONE (DELTASONE) 5 MG tablet Take 5 mg by mouth daily.   Yes Historical Provider, MD  propranolol (INDERAL LA) 60 MG 24 hr capsule Take 60 mg by mouth daily.   Yes Historical Provider, MD  spironolactone (ALDACTONE) 25 MG tablet Take 25 mg by mouth at bedtime.    Yes Historical Provider, MD  ursodiol (ACTIGALL) 300 MG capsule Take 300 mg by mouth 2 (two) times daily.    Yes Historical Provider, MD   Allergies No Known Allergies  Family History History reviewed. No pertinent family history. Social History  reports that she has been smoking Cigarettes.  She has a 1 pack-year smoking history. She does not have any smokeless tobacco history on file. She reports that  she does not drink alcohol or use illicit drugs.   Vital Signs: Temp:  [98.4 F (36.9 C)-99 F (37.2 C)] 98.6 F (37 C) (06/24 0427) Pulse Rate:  [91-109] 94  (06/24 0400) Resp:  [16-18] 16  (06/24 0400) BP: (96-110)/(39-77) 97/43 mmHg (06/24 0400) SpO2:  [100 %] 100 % (06/24 0400)  Physical Examination: General:  Pleasant female patient in mild acute distress because of abdominal pain. Neuro:  nonfocal HEENT:  PERRL, pink conjunctivae, moist membranes Neck:  Supple, no JVD   Cardiovascular:  RRR, no M/R/G Lungs:  Bilateral diminished air entry, no W/R/R Abdomen:  Soft, nontender, nondistended, bowel sounds present Musculoskeletal:  Moves all extremities, no pedal edema Skin:  No rash  Principal Problem:  *GI bleeding   ASSESSMENT AND PLAN  PULMONARY No results found for this basename: PHART:5,PCO2:5,PCO2ART:5,PO2ART:5,HCO3:5,O2SAT:5 in the last 168 hours Ventilator Settings:   A: Stable on RA   CARDIOVASCULAR No results found for this basename: TROPONINI:5,LATICACIDVEN:5, O2SATVEN:5,PROBNP:5 in the last 168 hours A: Stable P: Cardiac monitoring in the ICU   RENAL  Lab 07/22/11 0123  NA 137  K 4.2  CL 106  CO2 --  BUN 24*  CREATININE 0.70  CALCIUM --  MG --  PHOS --   Intake/Output    None      A:  Stable P:   1)  Monitor I&Os  GASTROINTESTINAL  Lab 07/22/11 0051  AST 118*  ALT 108*  ALKPHOS 579*  BILITOT 4.4*  PROT 5.1*  ALBUMIN 2.4*    A:  Acute upper GI bleed. Unclear if related to esophageal varices P:   1) GI consult called in the ER 2) 2 units of PRBC ordered in the ER 3) Will get two large bore peripheral IV's 4) NS 0.9% 1L bolus 5) Rocephin per pharmacy consult 6) Protonix drip 7) We will hold propanolol and Aldactone 8) Continue Imuran 9) Awaiting PT/INR APTT 10) Monitor CBC post transfusion 11) NPO 12) Will consider octreotide drip if esophageal varices bleed confirmed.   HEMATOLOGIC  Lab 07/22/11 0123 07/22/11  0051  HGB 7.8* 7.4*  HCT 23.0* 21.9*  PLT -- PLATELET CLUMPS NOTED ON SMEAR, COUNT APPEARS ADEQUATE  INR -- --  APTT -- --   A:  1) Anemia related to blood lose P:  1) Will transfuse two units of PRBC  INFECTIOUS  Lab 07/22/11 0051  WBC 6.6  PROCALCITON --    A:  No evident site of infection P:    ENDOCRINE No results found for this basename: GLUCAP:5 in the last 168 hours A:  No issues    NEUROLOGIC  A:  Neurologically intact. No evidence of encephalopathy.   BEST PRACTICE / DISPOSITION - Level of Care:  ICU - Primary Service:  Critical care - Consultants:  GI - Code Status:  Full code - Diet:  NPO - DVT Px:  SCD's - GI Px:  Protonix drip - Skin Integrity:  Intact  The patient is critically ill with multiple organ systems failure and requires high complexity decision making for assessment and support, frequent evaluation and titration of therapies, application of advanced monitoring technologies and extensive interpretation of multiple databases.   Critical Care Time devoted to patient care services described in this note is: 1 Hour  Lavella Hammock, M.D. Pulmonary and Critical Care Medicine [facility] Pager: [phone]  07/22/2011, 4:47 AM

## 2011-07-22 NOTE — Care Management Note (Signed)
    Page 1 of 1   07/22/2011     11:05:07 AM   CARE MANAGEMENT NOTE 07/22/2011  Patient:  Melanie Conley, Melanie Conley   Account Number:  [id number]  Date Initiated:  07/22/2011  Documentation initiated by:  Junius Creamer  Subjective/Objective Assessment:   adm w gi bleed     Action/Plan:   has medicaid, get care at [facility]   Anticipated DC Date:     Anticipated DC Plan:        DC Planning Services  CM consult      Choice offered to / List presented to:             Status of service:   Medicare Important Message given?   (If response is "NO", the following Medicare IM given date fields will be blank) Date Medicare IM given:   Date Additional Medicare IM given:    Discharge Disposition:    Per UR Regulation:  Reviewed for med. necessity/level of care/duration of stay  If discussed at Long Length of Stay Meetings, dates discussed:    Comments:  6/24 11:04a debbie Teleah Villamar rn,bsn [phone]

## 2011-07-22 NOTE — ED Notes (Signed)
Assisted pt to bathroom in wheelchair.  BM of dark red runny stool.  Dr Nedra Hai notified.  Pt states this is what her diarrhea has looked like since Fri.

## 2011-07-22 NOTE — ED Notes (Signed)
Pt c/o diarrhea and lower abd pain since Friday and emesis x 2 today.  States today she vomited water x 1 and "a trash can" of blood.

## 2011-07-22 NOTE — ED Notes (Signed)
Pt states decreased pain after morphine

## 2011-07-23 ENCOUNTER — Encounter (HOSPITAL_COMMUNITY): Payer: Self-pay | Admitting: Gastroenterology

## 2011-07-23 DIAGNOSIS — R112 Nausea with vomiting, unspecified: Secondary | ICD-10-CM

## 2011-07-23 DIAGNOSIS — K922 Gastrointestinal hemorrhage, unspecified: Principal | ICD-10-CM

## 2011-07-23 LAB — CBC
MCH: 31.8 pg (ref 26.0–34.0)
MCH: 31.8 pg (ref 26.0–34.0)
MCV: 91.8 fL (ref 78.0–100.0)
MCV: 93.1 fL (ref 78.0–100.0)
Platelets: 68 10*3/uL — ABNORMAL LOW (ref 150–400)
Platelets: 75 10*3/uL — ABNORMAL LOW (ref 150–400)
RBC: 2.33 MIL/uL — ABNORMAL LOW (ref 3.87–5.11)
RDW: 17.5 % — ABNORMAL HIGH (ref 11.5–15.5)

## 2011-07-23 LAB — TYPE AND SCREEN

## 2011-07-23 LAB — PREPARE FRESH FROZEN PLASMA
Unit division: 0
Unit division: 0

## 2011-07-23 LAB — BASIC METABOLIC PANEL
BUN: 8 mg/dL (ref 6–23)
Chloride: 108 mEq/L (ref 96–112)
Creatinine, Ser: 0.58 mg/dL (ref 0.50–1.10)
GFR calc Af Amer: >90 mL/min (ref >90)
GFR calc non Af Amer: >90 mL/min (ref >90)

## 2011-07-23 MED ORDER — AZATHIOPRINE 50 MG PO TABS
100.0000 mg | ORAL_TABLET | Freq: Every day | ORAL | Status: DC
  Administered 2011-07-23: 100 mg via ORAL
  Filled 2011-07-23: qty 2

## 2011-07-23 MED ORDER — PANTOPRAZOLE SODIUM 40 MG PO TBEC
40.0000 mg | DELAYED_RELEASE_TABLET | Freq: Two times a day (BID) | ORAL | Status: DC
  Administered 2011-07-23: 40 mg via ORAL
  Filled 2011-07-23: qty 1

## 2011-07-23 MED ORDER — PREDNISONE 5 MG PO TABS
5.0000 mg | ORAL_TABLET | Freq: Every day | ORAL | Status: DC
  Filled 2011-07-23: qty 1

## 2011-07-23 MED ORDER — SPIRONOLACTONE 25 MG PO TABS: 25.0000 mg | ORAL_TABLET | Freq: Every day | ORAL | Status: DC

## 2011-07-23 MED ORDER — HYDROCORTISONE SOD SUCCINATE 100 MG IJ SOLR
50.0000 mg | Freq: Two times a day (BID) | INTRAMUSCULAR | Status: DC
  Filled 2011-07-23 (×2): qty 1

## 2011-07-23 NOTE — Progress Notes (Signed)
Transferred to 3011 via wheelchair.

## 2011-07-23 NOTE — Progress Notes (Signed)
Patient called Clinical research associate to her room requesting to leave/go home. Writer informed her that the MD will have to discharge her with necessary prescriptions and instructions and that might not be tonight due to the time of request. Also reminded that if she leaves without medical advice, her insurance will not be billed and she will have to be billed. She stated she understood. Dr.Byrum notified and requested for patient to wait till one of the MD's come up and speak with patient.  Dr. Herma Carson came and spoke with patient and patient insisted on leaving. AMA form signed by patient and she left facility with her boyfriend and belongings at 2105. Ambulated out of facility, refusing to be transported via wheelchair.

## 2011-07-23 NOTE — Progress Notes (Signed)
[facility] Progress Note  Subjective: No report of further GI bleeding. The patient did report to me today that she had been taking Advil or Aleve prior to admission which may account for the findings on EGD yesterday. The nurse reports that the patient wants to go home and she is refusing further blood draws Objective: Vital signs in last 24 hours: Temp:  [97.8 F (36.6 C)-98.7 F (37.1 C)] 98.4 F (36.9 C) (06/25 0735) Pulse Rate:  [52-105] 76  (06/25 1130) Resp:  [14-25] 25  (06/25 0600) BP: (91-127)/(41-76) 104/58 mmHg (06/25 1130) SpO2:  [94 %-100 %] 100 % (06/25 1130) Weight:  [55.9 kg (123 lb 3.8 oz)] 55.9 kg (123 lb 3.8 oz) (06/25 0500) Weight change: 7 kg (15 lb 6.9 oz)   PE: She is in no distress vital signs are stable  Lab Results: Results for orders placed during the hospital encounter of 07/22/11 (from the past 24 hour(s))  CBC     Status: Abnormal   Collection Time   07/22/11  6:01 PM      Component Value Range   WBC 3.7 (*) 4.0 - 10.5 K/uL   RBC 2.38 (*) 3.87 - 5.11 MIL/uL   Hemoglobin 7.5 (*) 12.0 - 15.0 g/dL   HCT 16.1 (*) 09.6 - 04.5 %   MCV 92.4  78.0 - 100.0 fL   MCH 31.5  26.0 - 34.0 pg   MCHC 34.1  30.0 - 36.0 g/dL   RDW 40.9 (*) 81.1 - 91.4 %   Platelets 90 (*) 150 - 400 K/uL  CBC     Status: Abnormal   Collection Time   07/22/11 11:52 PM      Component Value Range   WBC 3.7 (*) 4.0 - 10.5 K/uL   RBC 2.20 (*) 3.87 - 5.11 MIL/uL   Hemoglobin 7.0 (*) 12.0 - 15.0 g/dL   HCT 78.2 (*) 95.6 - 21.3 %   MCV 91.8  78.0 - 100.0 fL   MCH 31.8  26.0 - 34.0 pg   MCHC 34.7  30.0 - 36.0 g/dL   RDW 08.6 (*) 57.8 - 46.9 %   Platelets 68 (*) 150 - 400 K/uL  BASIC METABOLIC PANEL     Status: Abnormal   Collection Time   07/23/11  6:35 AM      Component Value Range   Sodium 137  135 - 145 mEq/L   Potassium 3.7  3.5 - 5.1 mEq/L   Chloride 108  96 - 112 mEq/L   CO2 23  19 - 32 mEq/L   Glucose, Bld 147 (*) 70 - 99 mg/dL   BUN 8  6 - 23 mg/dL   Creatinine, Ser 6.29  0.50 - 1.10 mg/dL   Calcium 7.4 (*) 8.4 - 10.5 mg/dL   GFR calc non Af Amer >90  >90 mL/min   GFR calc Af Amer >90  >90 mL/min  PROTIME-INR     Status: Abnormal   Collection Time   07/23/11  6:35 AM      Component Value Range   Prothrombin Time 17.2 (*) 11.6 - 15.2 seconds   INR 1.38  0.00 - 1.49  CBC     Status: Abnormal   Collection Time   07/23/11  6:35 AM      Component Value Range   WBC 3.8 (*) 4.0 - 10.5 K/uL   RBC 2.33 (*) 3.87 - 5.11 MIL/uL   Hemoglobin 7.4 (*) 12.0 - 15.0 g/dL   HCT 52.8 (*)  36.0 - 46.0 %   MCV 93.1  78.0 - 100.0 fL   MCH 31.8  26.0 - 34.0 pg   MCHC 34.1  30.0 - 36.0 g/dL   RDW 16.1 (*) 09.6 - 04.5 %   Platelets 75 (*) 150 - 400 K/uL    Studies/Results: @RISRSLT24 @    Assessment: Upper GI bleed secondary to ulceration at GE junction and and post bulbar area. These are most likely secondary to nonsteroidal anti-inflammatory medication. There were no signs of variceal bleeding.  Plan: Avoidance of NSAIDs. Continue PPI therapy. Advanced diet as tolerated. Discontinue octreotide. When she does go home she will need to followup with her hepatologist at [facility].    Graylin Shiver 07/23/2011, 11:51 AM  Lab Results  Component Value Date   HGB 7.4* 07/23/2011   HGB 7.0* 07/22/2011   HGB 7.5* 07/22/2011   HCT 21.7* 07/23/2011   HCT 20.2* 07/22/2011   HCT 22.0* 07/22/2011   ALKPHOS 579* 07/22/2011   ALKPHOS 951* 06/09/2011   ALKPHOS 416* 04/13/2011   AST 118* 07/22/2011   AST 170* 06/09/2011   AST 83* 04/13/2011   ALT 108* 07/22/2011   ALT 134* 06/09/2011   ALT 70* 04/13/2011

## 2011-07-23 NOTE — Progress Notes (Signed)
  Active problems: Autoimmune hepatitis H/O esophageal varices UGIB Portal gastropathy  EGD report 6/24: FINDINGS: Esophagus: Scarring of the distal and midesophagus from prior esophageal variceal banding as seen on image is 11 and 12. No evidence of varices. There is a 5 mm ulcer at the esophagogastric junction as seen on image 10. Stomach: Portal hypertensive gastropathy, no gastric varices, no active bleeding.  Duodenum: The duodenal bulb looked normal, there are 2 post bulbar small ulcerations as seen on image #3  Subj: No complaints. Wants to go home  Obj: Filed Vitals:   07/23/11 1426  BP:   Pulse: 61  Temp: 98.8 F (37.1 C)  Resp:     Gen: NAD HEENT: WNL Neck: No JVD Chest: clear Cardiac: RRR s M Abd: NABS, soft Ext: No edema  BMET    Component Value Date/Time   NA 137 07/23/2011 0635   K 3.7 07/23/2011 0635   CL 108 07/23/2011 0635   CO2 23 07/23/2011 0635   GLUCOSE 147* 07/23/2011 0635   BUN 8 07/23/2011 0635   CREATININE 0.58 07/23/2011 0635   CALCIUM 7.4* 07/23/2011 0635   GFRNONAA >90 07/23/2011 0635   GFRAA >90 07/23/2011 0635    CBC    Component Value Date/Time   WBC 3.8* 07/23/2011 0635   RBC 2.33* 07/23/2011 0635   HGB 7.4* 07/23/2011 0635   HCT 21.7* 07/23/2011 0635   PLT 75* 07/23/2011 0635   MCV 93.1 07/23/2011 0635   MCH 31.8 07/23/2011 0635   MCHC 34.1 07/23/2011 0635   RDW 18.2* 07/23/2011 0635   LYMPHSABS 1.5 07/22/2011 0051   MONOABS 0.3 07/22/2011 0051   EOSABS 0.0 07/22/2011 0051   BASOSABS 0.0 07/22/2011 0051    CXR: no new CXR   IMPRESSION: Autoimmune hepatitis H/O esophageal varices UGIB Portal gastropathy by EGD 6/24  PLAN/RECS:  D/C octreotide and PPI gtts Protonix to PO Advance diet slowly Transfer to med-surg bed Resume Azathiorine Change HC to baseline dose of Pred Anticipate D/C home 6/26    Billy Fischer, MD ; [facility] service Mobile [phone].  After 5:30 PM or weekends, call [phone]

## 2011-07-23 NOTE — Progress Notes (Signed)
Pt refusing to have blood drawn; Dr Sung Amabile notified.

## 2011-07-23 NOTE — Progress Notes (Signed)
eLink Physician-Brief Progress Note Patient Name: Melanie Conley DOB: December 04, 1990 MRN: [id number]  Date of Service  07/23/2011   HPI/Events of Note   Notified by RN that the patient wants to leave tonight. Apparently plan is in place for her to go home in the am. I informed them both that this would be discharge against medical advice. Will ask Dr Marin Shutter to speak to her at bedside when he is available.   eICU Interventions        Verle Brillhart S. 07/23/2011, 8:13 PM

## 2011-09-15 ENCOUNTER — Emergency Department (HOSPITAL_COMMUNITY)
Admission: EM | Admit: 2011-09-15 | Discharge: 2011-09-15 | Disposition: A | Discharge status: Home / Self Care | Payer: BC Managed Care – PPO | Attending: Emergency Medicine | Admitting: Emergency Medicine

## 2011-09-15 ENCOUNTER — Encounter (HOSPITAL_COMMUNITY): Payer: Self-pay | Admitting: *Deleted

## 2011-09-15 DIAGNOSIS — N390 Urinary tract infection, site not specified: Secondary | ICD-10-CM | POA: Insufficient documentation

## 2011-09-15 DIAGNOSIS — F17200 Nicotine dependence, unspecified, uncomplicated: Secondary | ICD-10-CM | POA: Insufficient documentation

## 2011-09-15 DIAGNOSIS — Z79899 Other long term (current) drug therapy: Secondary | ICD-10-CM | POA: Insufficient documentation

## 2011-09-15 DIAGNOSIS — Z8619 Personal history of other infectious and parasitic diseases: Secondary | ICD-10-CM | POA: Insufficient documentation

## 2011-09-15 LAB — CBC WITH DIFFERENTIAL/PLATELET
Basophils Absolute: 0 10*3/uL (ref 0.0–0.1)
Lymphocytes Relative: 26 % (ref 12–46)
Lymphs Abs: 1 10*3/uL (ref 0.7–4.0)
MCV: 88.9 fL (ref 78.0–100.0)
Neutro Abs: 2.5 10*3/uL (ref 1.7–7.7)
Neutrophils Relative %: 63 % (ref 43–77)
Platelets: 127 10*3/uL — ABNORMAL LOW (ref 150–400)
RBC: 3.79 MIL/uL — ABNORMAL LOW (ref 3.87–5.11)
RDW: 18.1 % — ABNORMAL HIGH (ref 11.5–15.5)
WBC: 4 10*3/uL (ref 4.0–10.5)

## 2011-09-15 LAB — COMPREHENSIVE METABOLIC PANEL
ALT: 50 U/L — ABNORMAL HIGH (ref 0–35)
AST: 62 U/L — ABNORMAL HIGH (ref 0–37)
Alkaline Phosphatase: 738 U/L — ABNORMAL HIGH (ref 39–117)
CO2: 21 mEq/L (ref 19–32)
Calcium: 8.9 mg/dL (ref 8.4–10.5)
Chloride: 105 mEq/L (ref 96–112)
GFR calc Af Amer: >90 mL/min (ref >90)
GFR calc non Af Amer: >90 mL/min (ref >90)
Glucose, Bld: 152 mg/dL — ABNORMAL HIGH (ref 70–99)
Potassium: 3.4 mEq/L — ABNORMAL LOW (ref 3.5–5.1)
Sodium: 137 mEq/L (ref 135–145)
Total Bilirubin: 2.8 mg/dL — ABNORMAL HIGH (ref 0.3–1.2)

## 2011-09-15 LAB — URINALYSIS, ROUTINE W REFLEX MICROSCOPIC
Glucose, UA: NEGATIVE mg/dL
Hgb urine dipstick: NEGATIVE
Protein, ur: 30 mg/dL — AB

## 2011-09-15 LAB — URINE MICROSCOPIC-ADD ON

## 2011-09-15 LAB — POCT PREGNANCY, URINE: Preg Test, Ur: NEGATIVE

## 2011-09-15 MED ORDER — OXYCODONE HCL 5 MG PO TABS
5.0000 mg | ORAL_TABLET | Freq: Once | ORAL | Status: AC
  Administered 2011-09-15: 5 mg via ORAL
  Filled 2011-09-15: qty 1

## 2011-09-15 MED ORDER — SULFAMETHOXAZOLE-TMP DS 800-160 MG PO TABS: 1.0000 | ORAL_TABLET | Freq: Two times a day (BID) | ORAL | Status: AC

## 2011-09-15 MED ORDER — SULFAMETHOXAZOLE-TMP DS 800-160 MG PO TABS
1.0000 | ORAL_TABLET | Freq: Once | ORAL | Status: AC
  Administered 2011-09-15: 1 via ORAL
  Filled 2011-09-15: qty 1

## 2011-09-15 MED ORDER — PHENAZOPYRIDINE HCL 100 MG PO TABS
200.0000 mg | ORAL_TABLET | Freq: Once | ORAL | Status: AC
  Administered 2011-09-15: 200 mg via ORAL
  Filled 2011-09-15: qty 2

## 2011-09-15 MED ORDER — PHENAZOPYRIDINE HCL 200 MG PO TABS: 200.0000 mg | ORAL_TABLET | Freq: Three times a day (TID) | ORAL | Status: AC | PRN

## 2011-09-15 MED ORDER — OXYCODONE HCL 5 MG PO TABS: 5.0000 mg | ORAL_TABLET | ORAL | Status: AC | PRN

## 2011-09-15 NOTE — ED Notes (Signed)
Pt reports severe lower abdominal pain since last night. Denies n/v/d. Reports pain is constant with intermittent sharp stabbing pain.

## 2011-09-15 NOTE — ED Provider Notes (Signed)
History     CSN: [id number]  Arrival date & time 09/15/11  1349   First MD Initiated Contact with Patient 09/15/11 1511      Chief Complaint  Patient presents with  . Abdominal Pain    (Consider location/radiation/quality/duration/timing/severity/associated sxs/prior treatment) Patient is a 21 y.o. female presenting with abdominal pain. The history is provided by the patient.  Abdominal Pain The primary symptoms of the illness include abdominal pain. The primary symptoms of the illness do not include fever, nausea, vomiting or dysuria.  Additional symptoms associated with the illness include frequency. Symptoms associated with the illness do not include chills or back pain. Associated symptoms comments: Lower central abdominal pain of sudden onset last night. She complains of urinary frequency but no worse pain with urination. No back or flank pain. No fever, nausea or vomiting. She has a history of liver disease but denies any upper abdominal pain or vomiting. No vaginal discharge. .    Past Medical History  Diagnosis Date  . Liver disease   . Hepatitis     Past Surgical History  Procedure Date  . Gastric banding port revision   . Tonsillectomy   . Esophagogastroduodenoscopy 07/22/2011    Procedure: ESOPHAGOGASTRODUODENOSCOPY (EGD);  Surgeon: Graylin Shiver, MD;  Location: [facility] ENDOSCOPY;  Service: Endoscopy;  Laterality: N/A;    History reviewed. No pertinent family history.  History  Substance Use Topics  . Smoking status: Current Some Day Smoker -- 1.0 packs/day for 2 years    Types: Cigarettes  . Smokeless tobacco: Not on file  . Alcohol Use: No    OB History    Grav Para Term Preterm Abortions TAB SAB Ect Mult Living                  Review of Systems  Constitutional: Negative for fever and chills.  Respiratory: Negative.   Cardiovascular: Negative.   Gastrointestinal: Positive for abdominal pain. Negative for nausea and vomiting.  Genitourinary: Positive for  frequency and pelvic pain. Negative for dysuria and flank pain.  Musculoskeletal: Negative.  Negative for back pain.  Skin: Negative.   Neurological: Negative.     Allergies  Review of patient's allergies indicates no known allergies.  Home Medications   Current Outpatient Rx  Name Route Sig Dispense Refill  . AZATHIOPRINE 50 MG PO TABS Oral Take 100 mg by mouth daily.     Marland Kitchen VITAMIN D PO Oral Take 1 capsule by mouth daily.    Marland Kitchen LACTULOSE 10 GM/15ML PO SOLN Oral Take 20 g by mouth 2 (two) times daily as needed. For constipation.    Marland Kitchen LEVOFLOXACIN 750 MG PO TABS Oral Take 750 mg by mouth once a week. On Wednesday    . OMEPRAZOLE 20 MG PO CPDR Oral Take 20 mg by mouth daily.    Marland Kitchen PREDNISONE 5 MG PO TABS Oral Take 5 mg by mouth daily.    Marland Kitchen PROPRANOLOL HCL ER 60 MG PO CP24 Oral Take 60 mg by mouth daily.    Marland Kitchen SPIRONOLACTONE 25 MG PO TABS Oral Take 25 mg by mouth at bedtime.     Marland Kitchen URSODIOL 300 MG PO CAPS Oral Take 300 mg by mouth 2 (two) times daily.       BP 109/69  Pulse 95  Temp 98.4 F (36.9 C) (Oral)  Resp 14  Physical Exam  Constitutional: She is oriented to person, place, and time. She appears well-developed and well-nourished. No distress.  Pulmonary/Chest: Effort normal.  Abdominal: Soft. Bowel sounds are normal. She exhibits no distension and no mass. There is tenderness. There is no rebound and no guarding.       Suprapubic tenderness. No right or left lower abdominal tenderness. No mass. BS positive.   Neurological: She is alert and oriented to person, place, and time.  Skin: Skin is warm and dry.  Psychiatric: She has a normal mood and affect.    ED Course  Procedures (including critical care time)  Labs Reviewed  CBC WITH DIFFERENTIAL - Abnormal; Notable for the following:    RBC 3.79 (*)     Hemoglobin 10.7 (*)     HCT 33.7 (*)     RDW 18.1 (*)     Platelets 127 (*)  PLATELET COUNT CONFIRMED BY SMEAR   All other components within normal limits    COMPREHENSIVE METABOLIC PANEL - Abnormal; Notable for the following:    Potassium 3.4 (*)     Glucose, Bld 152 (*)     Albumin 2.9 (*)     AST 62 (*)     ALT 50 (*)     Alkaline Phosphatase 738 (*)     Total Bilirubin 2.8 (*)     All other components within normal limits  URINALYSIS, ROUTINE W REFLEX MICROSCOPIC - Abnormal; Notable for the following:    Color, Urine ORANGE (*)  BIOCHEMICALS MAY BE AFFECTED BY COLOR   APPearance TURBID (*)     Bilirubin Urine MODERATE (*)     Ketones, ur 15 (*)     Protein, ur 30 (*)     Urobilinogen, UA 2.0 (*)     Nitrite POSITIVE (*)     Leukocytes, UA MODERATE (*)     All other components within normal limits  URINE MICROSCOPIC-ADD ON - Abnormal; Notable for the following:    Squamous Epithelial / LPF MANY (*)     Bacteria, UA MANY (*)     All other components within normal limits  LIPASE, BLOOD  POCT PREGNANCY, URINE   No results found.   No diagnosis found.  1. uti   MDM  Blood studies are at comparable baseline or improved from baseline. Tenderness localized to suprapubic area with positive urine for infection and symptom of urinary frequency. No fever, vomiting or flank pain. Suspect uncomplicated UTI.        Rodena Medin, PA-C 09/15/11 1617

## 2011-09-16 NOTE — Discharge Planning (Deleted)
DISCHARGE SUMMARY  DATE OF ADMISSION:  6/24  DATE OF DISCHARGE:  6/25  ADMISSION DIAGNOSES:   UGI bleeding  Anemia, acute blood loss anemia Hypotension Autoimmune hepatitis Portal venous hypertension Coagulopathy of liver disease (PT 20.4 sec, INR 1.71)    DISCHARGE DIAGNOSES:   UGI bleeding due to ulceration at GE junction and and post bulbar area Anemia, acute blood loss anemia Hypotension Autoimmune hepatitis Portal venous hypertension Portal venous gastropathy Coagulopathy of liver disease (PT 20.4 sec, INR 1.71)   HPI: The patient was admitted by Dr Selena Batten of [facility] with the following history:  21 yo female with chronic liver disease (likely autoimmune hepatitis), has c/o n/v x2 , The second episode was associated with blood emesis, about 1/2 cup according to the patient, pt notes silght epigastric discomfort. pt denies fever, chills, cough, diarrhea, constipation, black stool, borderline hypotension and initial Hgb of 7.4. She admitted to using NSAIDS prior to admission.   HOSPITAL COURSE:   She was initially admitted to ICU and PCCM service assumed her care. She was resuscitated with IVFs and blood products and received antibiotics to prophylax against SBP. She was seen in consultation by Dr Evette Cristal (GI Med) and underwent EGD which revealed upper GI bleed secondary to ulceration at GE junction and and post bulbar area. These were thought to be most likely secondary to nonsteroidal anti-inflammatory medication. There were no signs of variceal bleeding. She was transferred out of ICU on 6/25. She insisted on leaving hospital later that day and subsequently was discharged AMA. No follow up plans were made. No changes to her medical regimen were made.      Billy Fischer, MD;  PCCM service; Mobile [phone]

## 2011-09-16 NOTE — Discharge Summary (Signed)
DISCHARGE SUMMARY  DATE OF ADMISSION:  6/24  DATE OF DISCHARGE:  6/25  ADMISSION DIAGNOSES:   UGI bleeding  Anemia, acute blood loss anemia Hypotension Autoimmune hepatitis Portal venous hypertension Coagulopathy of liver disease (PT 20.4 sec, INR 1.71)    DISCHARGE DIAGNOSES:   UGI bleeding due to ulceration at GE junction and and post bulbar area Anemia, acute blood loss anemia Hypotension Autoimmune hepatitis Portal venous hypertension Portal venous gastropathy Coagulopathy of liver disease (PT 20.4 sec, INR 1.71)   HPI: The patient was admitted by Dr Kim of [facility] with the following history:  20 yo female with chronic liver disease (likely autoimmune hepatitis), has c/o n/v x2 , The second episode was associated with blood emesis, about 1/2 cup according to the patient, pt notes silght epigastric discomfort. pt denies fever, chills, cough, diarrhea, constipation, black stool, borderline hypotension and initial Hgb of 7.4. She admitted to using NSAIDS prior to admission.   HOSPITAL COURSE:   She was initially admitted to ICU and PCCM service assumed her care. She was resuscitated with IVFs and blood products and received antibiotics to prophylax against SBP. She was seen in consultation by Dr Ganem (GI Med) and underwent EGD which revealed upper GI bleed secondary to ulceration at GE junction and and post bulbar area. These were thought to be most likely secondary to nonsteroidal anti-inflammatory medication. There were no signs of variceal bleeding. She was transferred out of ICU on 6/25. She insisted on leaving hospital later that day and subsequently was discharged AMA. No follow up plans were made. No changes to her medical regimen were made.      David Simonds, MD;  PCCM service; Mobile [phone]   

## 2011-09-16 NOTE — ED Provider Notes (Signed)
Medical screening examination/treatment/procedure(s) were performed by non-physician practitioner and as supervising physician I was immediately available for consultation/collaboration.  Derwood Kaplan, MD 09/16/11 [phone]

## 2011-09-27 ENCOUNTER — Encounter (HOSPITAL_COMMUNITY): Payer: Self-pay | Admitting: Family Medicine

## 2011-09-27 ENCOUNTER — Emergency Department (HOSPITAL_COMMUNITY)
Admission: EM | Admit: 2011-09-27 | Discharge: 2011-09-27 | Disposition: A | Discharge status: Home / Self Care | Payer: BC Managed Care – PPO | Attending: Emergency Medicine | Admitting: Emergency Medicine

## 2011-09-27 DIAGNOSIS — N390 Urinary tract infection, site not specified: Secondary | ICD-10-CM

## 2011-09-27 DIAGNOSIS — F17200 Nicotine dependence, unspecified, uncomplicated: Secondary | ICD-10-CM | POA: Insufficient documentation

## 2011-09-27 DIAGNOSIS — R112 Nausea with vomiting, unspecified: Secondary | ICD-10-CM

## 2011-09-27 DIAGNOSIS — Z9884 Bariatric surgery status: Secondary | ICD-10-CM | POA: Insufficient documentation

## 2011-09-27 DIAGNOSIS — K754 Autoimmune hepatitis: Secondary | ICD-10-CM | POA: Insufficient documentation

## 2011-09-27 DIAGNOSIS — K5289 Other specified noninfective gastroenteritis and colitis: Secondary | ICD-10-CM | POA: Insufficient documentation

## 2011-09-27 LAB — CBC WITH DIFFERENTIAL/PLATELET
Basophils Relative: 0 % (ref 0–1)
Eosinophils Relative: 1 % (ref 0–5)
HCT: 27.7 % — ABNORMAL LOW (ref 36.0–46.0)
Hemoglobin: 8.9 g/dL — ABNORMAL LOW (ref 12.0–15.0)
Lymphs Abs: 0.4 10*3/uL — ABNORMAL LOW (ref 0.7–4.0)
MCH: 28.7 pg (ref 26.0–34.0)
MCV: 89.4 fL (ref 78.0–100.0)
Monocytes Absolute: 0.3 10*3/uL (ref 0.1–1.0)
Monocytes Relative: 6 % (ref 3–12)
Neutro Abs: 4.1 10*3/uL (ref 1.7–7.7)
Platelets: 45 10*3/uL — ABNORMAL LOW (ref 150–400)
RBC: 3.1 MIL/uL — ABNORMAL LOW (ref 3.87–5.11)

## 2011-09-27 LAB — POCT I-STAT, CHEM 8
BUN: 7 mg/dL (ref 6–23)
Chloride: 105 mEq/L (ref 96–112)
Creatinine, Ser: 0.8 mg/dL (ref 0.50–1.10)
Glucose, Bld: 92 mg/dL (ref 70–99)
Hemoglobin: 10.9 g/dL — ABNORMAL LOW (ref 12.0–15.0)
Potassium: 3.8 mEq/L (ref 3.5–5.1)
Sodium: 140 mEq/L (ref 135–145)

## 2011-09-27 LAB — URINALYSIS, ROUTINE W REFLEX MICROSCOPIC
Hgb urine dipstick: NEGATIVE
Urobilinogen, UA: 4 mg/dL — ABNORMAL HIGH (ref 0.0–1.0)
pH: 7.5 (ref 5.0–8.0)

## 2011-09-27 LAB — WET PREP, GENITAL
WBC, Wet Prep HPF POC: NONE SEEN
Yeast Wet Prep HPF POC: NONE SEEN

## 2011-09-27 LAB — URINE MICROSCOPIC-ADD ON

## 2011-09-27 LAB — HEPATIC FUNCTION PANEL
ALT: 40 U/L — ABNORMAL HIGH (ref 0–35)
AST: 45 U/L — ABNORMAL HIGH (ref 0–37)
Bilirubin, Direct: 2.5 mg/dL — ABNORMAL HIGH (ref 0.0–0.3)
Total Protein: 6.5 g/dL (ref 6.0–8.3)

## 2011-09-27 MED ORDER — ONDANSETRON HCL 4 MG PO TABS: 4.0000 mg | ORAL_TABLET | Freq: Four times a day (QID) | ORAL | Status: AC

## 2011-09-27 MED ORDER — ONDANSETRON HCL 4 MG/2ML IJ SOLN
4.0000 mg | Freq: Once | INTRAMUSCULAR | Status: AC
  Administered 2011-09-27: 4 mg via INTRAVENOUS
  Filled 2011-09-27: qty 2

## 2011-09-27 MED ORDER — CEPHALEXIN 500 MG PO CAPS: 500.0000 mg | ORAL_CAPSULE | Freq: Four times a day (QID) | ORAL | Status: AC

## 2011-09-27 MED ORDER — SODIUM CHLORIDE 0.9 % IV BOLUS (SEPSIS)
1000.0000 mL | Freq: Once | INTRAVENOUS | Status: AC
  Administered 2011-09-27: 1000 mL via INTRAVENOUS

## 2011-09-27 MED ORDER — DEXTROSE 5 % IV SOLN
1.0000 g | Freq: Once | INTRAVENOUS | Status: AC
  Administered 2011-09-27: 1 g via INTRAVENOUS
  Filled 2011-09-27: qty 10

## 2011-09-27 MED ORDER — MORPHINE SULFATE 4 MG/ML IJ SOLN
4.0000 mg | Freq: Once | INTRAMUSCULAR | Status: AC
  Administered 2011-09-27: 4 mg via INTRAVENOUS
  Filled 2011-09-27 (×2): qty 1

## 2011-09-27 NOTE — ED Provider Notes (Signed)
Medical screening examination/treatment/procedure(s) were performed by non-physician practitioner and as supervising physician I was immediately available for consultation/collaboration.  Flint Melter, MD 09/27/11 [phone]

## 2011-09-27 NOTE — ED Provider Notes (Signed)
History     CSN: [id number]  Arrival date & time 09/27/11  0921   First MD Initiated Contact with Patient 09/27/11 1015      Chief Complaint  Patient presents with  . Abdominal Pain    (Consider location/radiation/quality/duration/timing/severity/associated sxs/prior treatment) HPI  21 year old female with history of gastric banding, history of autoimmune hepatitis, presents complaining of abdominal pain. Patient reports for the past 2 days she has had abdominal pain. Pain initially started in her low abdomen but now radiates to the entire abdomen. Describe pain as a sharp sensation lasting for minutes, waxing and waning. Pain is increasing in frequency. She associate nausea, vomiting, diarrhea with the symptoms. sts she vomits a few times and it's non bloody, non bilious.  Had one bout of diarrhea this morning. Pain worsening with movement. She denies fever, chills, chest pain, shortness of breath, urinary symptoms, vaginal discharge, or rash. Her last menstruation was 2 weeks ago. She reports having lack of appetite.  The last time that she was having similar pain was several weeks ago when she was diagnosed with having a urinary tract infection.  Past Medical History  Diagnosis Date  . Liver disease   . Hepatitis     Past Surgical History  Procedure Date  . Gastric banding port revision   . Tonsillectomy   . Esophagogastroduodenoscopy 07/22/2011    Procedure: ESOPHAGOGASTRODUODENOSCOPY (EGD);  Surgeon: Graylin Shiver, MD;  Location: [facility] ENDOSCOPY;  Service: Endoscopy;  Laterality: N/A;    History reviewed. No pertinent family history.  History  Substance Use Topics  . Smoking status: Current Some Day Smoker -- 1.0 packs/day for 2 years    Types: Cigarettes  . Smokeless tobacco: Not on file  . Alcohol Use: No    OB History    Grav Para Term Preterm Abortions TAB SAB Ect Mult Living                  Review of Systems  All other systems reviewed and are  negative.    Allergies  Review of patient's allergies indicates no known allergies.  Home Medications   Current Outpatient Rx  Name Route Sig Dispense Refill  . AZATHIOPRINE 50 MG PO TABS Oral Take 100 mg by mouth daily.     Marland Kitchen VITAMIN D PO Oral Take 1 capsule by mouth daily.    Marland Kitchen LACTULOSE 10 GM/15ML PO SOLN Oral Take 20 g by mouth 2 (two) times daily as needed. For constipation.    Marland Kitchen LEVOFLOXACIN 750 MG PO TABS Oral Take 750 mg by mouth once a week. On Wednesday    . OMEPRAZOLE 20 MG PO CPDR Oral Take 20 mg by mouth daily.    Marland Kitchen PREDNISONE 5 MG PO TABS Oral Take 5 mg by mouth daily.    Marland Kitchen PROPRANOLOL HCL ER 60 MG PO CP24 Oral Take 60 mg by mouth daily.    Marland Kitchen SPIRONOLACTONE 25 MG PO TABS Oral Take 25 mg by mouth at bedtime.     Marland Kitchen URSODIOL 300 MG PO CAPS Oral Take 300 mg by mouth 2 (two) times daily.       BP 119/78  Pulse 73  Temp 98.6 F (37 C)  Resp 18  SpO2 97%  LMP 09/18/2011  Physical Exam  Nursing note and vitals reviewed. Constitutional: She appears well-developed and well-nourished. No distress.       Awake, alert, nontoxic appearance  HENT:  Head: Atraumatic.  Mouth/Throat: Oropharynx is clear and moist.  Eyes:  Conjunctivae are normal. Right eye exhibits no discharge. Left eye exhibits no discharge. Scleral icterus is present.  Neck: Neck supple.  Cardiovascular: Normal rate and regular rhythm.  Exam reveals no gallop and no friction rub.   No murmur heard. Pulmonary/Chest: Effort normal. No respiratory distress. She has no wheezes. She has no rales. She exhibits no tenderness.  Abdominal: Soft. She exhibits no mass. There is tenderness in the suprapubic area. There is guarding and CVA tenderness. There is no rigidity and no rebound. No hernia. Hernia confirmed negative in the ventral area, confirmed negative in the right inguinal area and confirmed negative in the left inguinal area.  Genitourinary: Vagina normal and uterus normal. There is no rash or tenderness on  the right labia. There is no rash or tenderness on the left labia. Cervix exhibits no motion tenderness and no discharge. Right adnexum displays no tenderness. Left adnexum displays no tenderness. No tenderness or bleeding around the vagina. No foreign body around the vagina. No vaginal discharge found.       Chaperone present  Musculoskeletal: She exhibits no edema and no tenderness.       ROM appears intact, no obvious focal weakness  Lymphadenopathy:       Right: No inguinal adenopathy present.       Left: No inguinal adenopathy present.  Neurological: She is alert.       Mental status and motor strength appears intact  Skin: Skin is warm. No rash noted.  Psychiatric: She has a normal mood and affect.    ED Course  Procedures (including critical care time)   Labs Reviewed  POCT PREGNANCY, URINE  URINALYSIS, ROUTINE W REFLEX MICROSCOPIC  CBC WITH DIFFERENTIAL   Results for orders placed during the hospital encounter of 09/27/11  URINALYSIS, ROUTINE W REFLEX MICROSCOPIC      Component Value Range   Color, Urine RED (*) YELLOW   APPearance CLOUDY (*) CLEAR   Specific Gravity, Urine 1.020  1.005 - 1.030   pH 7.5  5.0 - 8.0   Glucose, UA NEGATIVE  NEGATIVE mg/dL   Hgb urine dipstick NEGATIVE  NEGATIVE   Bilirubin Urine MODERATE (*) NEGATIVE   Ketones, ur 15 (*) NEGATIVE mg/dL   Protein, ur NEGATIVE  NEGATIVE mg/dL   Urobilinogen, UA 4.0 (*) 0.0 - 1.0 mg/dL   Nitrite POSITIVE (*) NEGATIVE   Leukocytes, UA MODERATE (*) NEGATIVE  CBC WITH DIFFERENTIAL      Component Value Range   WBC 4.8  4.0 - 10.5 K/uL   RBC 3.10 (*) 3.87 - 5.11 MIL/uL   Hemoglobin 8.9 (*) 12.0 - 15.0 g/dL   HCT 47.8 (*) 29.5 - 62.1 %   MCV 89.4  78.0 - 100.0 fL   MCH 28.7  26.0 - 34.0 pg   MCHC 32.1  30.0 - 36.0 g/dL   RDW 30.8 (*) 65.7 - 84.6 %   Platelets 45 (*) 150 - 400 K/uL   Neutrophils Relative 84 (*) 43 - 77 %   Lymphocytes Relative 9 (*) 12 - 46 %   Monocytes Relative 6  3 - 12 %    Eosinophils Relative 1  0 - 5 %   Basophils Relative 0  0 - 1 %   Neutro Abs 4.1  1.7 - 7.7 K/uL   Lymphs Abs 0.4 (*) 0.7 - 4.0 K/uL   Monocytes Absolute 0.3  0.1 - 1.0 K/uL   Eosinophils Absolute 0.0  0.0 - 0.7 K/uL   Basophils Absolute 0.0  0.0 - 0.1 K/uL   RBC Morphology POLYCHROMASIA PRESENT     Smear Review LARGE PLATELETS PRESENT    POCT PREGNANCY, URINE      Component Value Range   Preg Test, Ur NEGATIVE  NEGATIVE  WET PREP, GENITAL      Component Value Range   Yeast Wet Prep HPF POC NONE SEEN  NONE SEEN   Trich, Wet Prep NONE SEEN  NONE SEEN   Clue Cells Wet Prep HPF POC FEW (*) NONE SEEN   WBC, Wet Prep HPF POC NONE SEEN  NONE SEEN  POCT I-STAT, CHEM 8      Component Value Range   Sodium 140  135 - 145 mEq/L   Potassium 3.8  3.5 - 5.1 mEq/L   Chloride 105  96 - 112 mEq/L   BUN 7  6 - 23 mg/dL   Creatinine, Ser 4.54  0.50 - 1.10 mg/dL   Glucose, Bld 92  70 - 99 mg/dL   Calcium, Ion 0.98  1.19 - 1.23 mmol/L   TCO2 26  0 - 100 mmol/L   Hemoglobin 10.9 (*) 12.0 - 15.0 g/dL   HCT 14.7 (*) 82.9 - 56.2 %  URINE MICROSCOPIC-ADD ON      Component Value Range   Squamous Epithelial / LPF MANY (*) RARE   WBC, UA 3-6  <3 WBC/hpf   Bacteria, UA RARE  RARE  HEPATIC FUNCTION PANEL      Component Value Range   Total Protein 6.5  6.0 - 8.3 g/dL   Albumin 3.0 (*) 3.5 - 5.2 g/dL   AST 45 (*) 0 - 37 U/L   ALT 40 (*) 0 - 35 U/L   Alkaline Phosphatase 544 (*) 39 - 117 U/L   Total Bilirubin 4.3 (*) 0.3 - 1.2 mg/dL   Bilirubin, Direct 2.5 (*) 0.0 - 0.3 mg/dL   Indirect Bilirubin 1.8 (*) 0.3 - 0.9 mg/dL  LIPASE, BLOOD      Component Value Range   Lipase 23  11 - 59 U/L     1. UTI 2. Gastroenteritis   MDM  Patient with low abdominal pain with associate nausea, vomiting, diarrhea.  Work up initiated  12:00 PM Pt felt better after ivf and pain medication.  UA shows evidence of UTI, urine culture sent.  Will give rocephin IV.  Pt has sxs of gastroenteritis, but sts she is  developing an appetite, will PO trial.  Pelvic examination unremarkable.  Pt has scleral icterus, hepatic function panel sent.  Will continue to monitor, discussed with my attending.    1:25 PM Pt sts she feels better, has appetite, able to tolerates PO.  VSS.  Her hemoglobin is 8.9 today, close to baseline.  She also has abnormal plateletes, likely secondary to her autoimmune hepatitis.  Her Hepatic FUnction panel is elevated, but in line with her baseline.  Pt agrees to f/u with her PCP Dr. Agustin Cree in [place].  Therefore, pt will be given antibiotic for treatment of her UTI.  Pt stable to be discharge at this time.        Fayrene Helper, PA-C 09/27/11 1506

## 2011-09-27 NOTE — ED Notes (Signed)
Pt complaining of generalized abdominal pain for a few days associated with N,V,D.

## 2011-09-28 LAB — URINE CULTURE: Colony Count: 45000

## 2011-10-02 NOTE — ED Notes (Signed)
+  Chlamydia Chart sent to EDP office for review.  

## 2011-10-05 NOTE — ED Notes (Signed)
Chart returned from EDP office. Prescribed Azithromycin 1 gram in one time dose. Prescribed by Grant Fontana PA-C.

## 2011-10-05 NOTE — ED Notes (Signed)
Left voicemail for patient to call back. 

## 2011-10-06 NOTE — ED Notes (Signed)
Left voicemail for patient to call back. 

## 2011-10-07 NOTE — ED Notes (Signed)
I have attempted to contact this patient by phone with the following results: voice mail message left for patient to return call.

## 2011-10-08 NOTE — ED Notes (Signed)
I left a message for the patient to return my call.

## 2011-10-10 NOTE — ED Notes (Addendum)
RX called to [facility] by Patty PFM. [employer] faxed.

## 2011-10-19 ENCOUNTER — Encounter (HOSPITAL_COMMUNITY): Payer: Self-pay | Admitting: *Deleted

## 2011-10-19 ENCOUNTER — Emergency Department (HOSPITAL_COMMUNITY)
Admission: EM | Admit: 2011-10-19 | Discharge: 2011-10-19 | Disposition: A | Discharge status: Home / Self Care | Payer: BC Managed Care – PPO | Attending: Emergency Medicine | Admitting: Emergency Medicine

## 2011-10-19 DIAGNOSIS — N72 Inflammatory disease of cervix uteri: Secondary | ICD-10-CM

## 2011-10-19 DIAGNOSIS — Z202 Contact with and (suspected) exposure to infections with a predominantly sexual mode of transmission: Secondary | ICD-10-CM

## 2011-10-19 DIAGNOSIS — N760 Acute vaginitis: Secondary | ICD-10-CM | POA: Insufficient documentation

## 2011-10-19 DIAGNOSIS — B3731 Acute candidiasis of vulva and vagina: Secondary | ICD-10-CM

## 2011-10-19 DIAGNOSIS — B373 Candidiasis of vulva and vagina: Secondary | ICD-10-CM

## 2011-10-19 LAB — COMPREHENSIVE METABOLIC PANEL
ALT: 36 U/L — ABNORMAL HIGH (ref 0–35)
AST: 36 U/L (ref 0–37)
Albumin: 3 g/dL — ABNORMAL LOW (ref 3.5–5.2)
Alkaline Phosphatase: 497 U/L — ABNORMAL HIGH (ref 39–117)
Potassium: 3.8 mEq/L (ref 3.5–5.1)
Sodium: 133 mEq/L — ABNORMAL LOW (ref 135–145)
Total Protein: 6.7 g/dL (ref 6.0–8.3)

## 2011-10-19 LAB — CBC WITH DIFFERENTIAL/PLATELET
Basophils Absolute: 0 10*3/uL (ref 0.0–0.1)
Eosinophils Relative: 2 % (ref 0–5)
HCT: 30.9 % — ABNORMAL LOW (ref 36.0–46.0)
Lymphocytes Relative: 23 % (ref 12–46)
Monocytes Relative: 7 % (ref 3–12)
Platelets: 101 10*3/uL — ABNORMAL LOW (ref 150–400)
RBC: 3.38 MIL/uL — ABNORMAL LOW (ref 3.87–5.11)
RDW: 24.4 % — ABNORMAL HIGH (ref 11.5–15.5)
WBC: 4.1 10*3/uL (ref 4.0–10.5)

## 2011-10-19 LAB — URINALYSIS, ROUTINE W REFLEX MICROSCOPIC
Glucose, UA: NEGATIVE mg/dL
Protein, ur: NEGATIVE mg/dL
Specific Gravity, Urine: 1.028 (ref 1.005–1.030)
Urobilinogen, UA: 4 mg/dL — ABNORMAL HIGH (ref 0.0–1.0)

## 2011-10-19 LAB — URINE MICROSCOPIC-ADD ON

## 2011-10-19 LAB — PREGNANCY, URINE: Preg Test, Ur: NEGATIVE

## 2011-10-19 LAB — WET PREP, GENITAL

## 2011-10-19 MED ORDER — FLUCONAZOLE 100 MG PO TABS
150.0000 mg | ORAL_TABLET | Freq: Once | ORAL | Status: AC
  Administered 2011-10-19: 150 mg via ORAL
  Filled 2011-10-19: qty 2

## 2011-10-19 MED ORDER — AZITHROMYCIN 250 MG PO TABS
1000.0000 mg | ORAL_TABLET | Freq: Once | ORAL | Status: AC
  Administered 2011-10-19: 1000 mg via ORAL
  Filled 2011-10-19: qty 4

## 2011-10-19 MED ORDER — SULFAMETHOXAZOLE-TRIMETHOPRIM 800-160 MG PO TABS: 1.0000 | ORAL_TABLET | Freq: Two times a day (BID) | ORAL | Status: DC

## 2011-10-19 MED ORDER — OXYCODONE-ACETAMINOPHEN 5-325 MG PO TABS
2.0000 | ORAL_TABLET | Freq: Once | ORAL | Status: AC
  Administered 2011-10-19: 2 via ORAL
  Filled 2011-10-19: qty 2

## 2011-10-19 MED ORDER — FLUCONAZOLE 150 MG PO TABS: 150.0000 mg | ORAL_TABLET | Freq: Once | ORAL | Status: DC

## 2011-10-19 MED ORDER — CEFTRIAXONE SODIUM 250 MG IJ SOLR
250.0000 mg | Freq: Once | INTRAMUSCULAR | Status: AC
  Administered 2011-10-19: 250 mg via INTRAMUSCULAR
  Filled 2011-10-19: qty 250

## 2011-10-19 MED ORDER — LIDOCAINE HCL (PF) 1 % IJ SOLN
INTRAMUSCULAR | Status: AC
  Administered 2011-10-19: 18:00:00
  Filled 2011-10-19: qty 5

## 2011-10-19 NOTE — ED Notes (Signed)
AIDET performed. 

## 2011-10-19 NOTE — ED Provider Notes (Signed)
History     CSN: [id number]  Arrival date & time 10/19/11  1346   First MD Initiated Contact with Patient 10/19/11 1559      Chief Complaint  Patient presents with  . Abdominal Pain    (Consider location/radiation/quality/duration/timing/severity/associated sxs/prior treatment) HPI Comments: Melanie Conley 21 y.o. female   The chief complaint is: Patient presents with:   Abdominal Pain   The patient has medical history significant for:   Past Medical History:   Liver disease                                                Hepatitis                                                   Patient presents with abdominal pain X2 days. Associated symptoms include dysuria, urgency, and vulva pain with wiping. Of note patient was seen her 09/27/11 and was diagnosed with a UTI. Pelvic at that time was unremarkable but swabs for gc/chlamydia were positive. Patient states that was never informed of this however her partner tested positive for chlamydia 3 weeks ago. He told her it was a bacteria that you could just get from anywhere and nothing for her to worry about. Patient states that she has been monogamous and no other partners. Denies fever or chills. Denies NVD. Denies hematuria. Denies prior history of STI.     The history is provided by the patient.    Past Medical History  Diagnosis Date  . Liver disease   . Hepatitis     Past Surgical History  Procedure Date  . Gastric banding port revision   . Tonsillectomy   . Esophagogastroduodenoscopy 07/22/2011    Procedure: ESOPHAGOGASTRODUODENOSCOPY (EGD);  Surgeon: Graylin Shiver, MD;  Location: [facility] ENDOSCOPY;  Service: Endoscopy;  Laterality: N/A;    No family history on file.  History  Substance Use Topics  . Smoking status: Current Some Day Smoker -- 1.0 packs/day for 2 years    Types: Cigarettes  . Smokeless tobacco: Not on file  . Alcohol Use: No    OB History    Grav Para Term Preterm Abortions TAB SAB Ect Mult  Living                  Review of Systems  Constitutional: Negative for fever and chills.  Gastrointestinal: Negative for nausea, vomiting, diarrhea and anal bleeding.  Genitourinary: Positive for dysuria, frequency and vaginal pain. Negative for hematuria.  All other systems reviewed and are negative.    Allergies  Review of patient's allergies indicates no known allergies.  Home Medications   Current Outpatient Rx  Name Route Sig Dispense Refill  . AZATHIOPRINE 50 MG PO TABS Oral Take 100 mg by mouth daily.     Marland Kitchen VITAMIN D PO Oral Take 1 capsule by mouth daily.    Marland Kitchen LACTULOSE 10 GM/15ML PO SOLN Oral Take 20 g by mouth 2 (two) times daily as needed. For constipation.    Marland Kitchen LEVOFLOXACIN 750 MG PO TABS Oral Take 750 mg by mouth once a week. On Wednesday    . OMEPRAZOLE 20 MG PO CPDR Oral Take 20 mg by mouth daily.    Marland Kitchen  PREDNISONE 5 MG PO TABS Oral Take 5 mg by mouth daily.    Marland Kitchen PROPRANOLOL HCL ER 60 MG PO CP24 Oral Take 60 mg by mouth daily.    Marland Kitchen SPIRONOLACTONE 25 MG PO TABS Oral Take 25 mg by mouth at bedtime.     Marland Kitchen URSODIOL 300 MG PO CAPS Oral Take 300 mg by mouth 2 (two) times daily.       BP 106/62  Pulse 84  Temp 98.4 F (36.9 C) (Oral)  Resp 20  Ht 4\' 9"  (1.448 m)  Wt 120 lb (54.432 kg)  BMI 25.97 kg/m2  LMP 09/18/2011  Physical Exam  Nursing note and vitals reviewed. Constitutional: She appears well-developed and well-nourished.  HENT:  Head: Normocephalic and atraumatic.  Mouth/Throat: Oropharynx is clear and moist.  Eyes: Conjunctivae normal and EOM are normal. Scleral icterus is present.       Scleral icterus consistent with long standing history or hepatitis.  Neck: Normal range of motion. Neck supple.  Cardiovascular: Normal rate, regular rhythm and normal heart sounds.   Pulmonary/Chest: Effort normal and breath sounds normal.  Abdominal: Soft. Bowel sounds are normal. There is tenderness.       Suprapubic tenderness appreciated.  Genitourinary:  Vaginal discharge found.       External exam shows areas of white clumps on the vulva consistent with yeast.  Moderate amount of white to yellow discharge. Cervix shows friability on collection of swabs.  CMT noted on bimanual examination.  Neurological: She is alert.  Skin: Skin is warm and dry.    ED Course  Procedures (including critical care time)  Labs Reviewed  URINALYSIS, ROUTINE W REFLEX MICROSCOPIC - Abnormal; Notable for the following:    Color, Urine ORANGE (*)  BIOCHEMICALS MAY BE AFFECTED BY COLOR   APPearance TURBID (*)     Bilirubin Urine LARGE (*)     Ketones, ur 15 (*)     Urobilinogen, UA 4.0 (*)     Nitrite POSITIVE (*)     Leukocytes, UA SMALL (*)     All other components within normal limits  CBC WITH DIFFERENTIAL - Abnormal; Notable for the following:    RBC 3.38 (*)     Hemoglobin 10.1 (*)     HCT 30.9 (*)     RDW 24.4 (*)     Platelets 101 (*)  PLATELET COUNT CONFIRMED BY SMEAR   All other components within normal limits  COMPREHENSIVE METABOLIC PANEL - Abnormal; Notable for the following:    Sodium 133 (*)     Creatinine, Ser 0.47 (*)     Albumin 3.0 (*)     ALT 36 (*)     Alkaline Phosphatase 497 (*)     Total Bilirubin 5.2 (*)     All other components within normal limits  URINE MICROSCOPIC-ADD ON - Abnormal; Notable for the following:    Squamous Epithelial / LPF MANY (*)     Bacteria, UA MANY (*)     All other components within normal limits  PREGNANCY, URINE  GC/CHLAMYDIA PROBE AMP, GENITAL  WET PREP, GENITAL   Results for orders placed during the hospital encounter of 10/19/11  URINALYSIS, ROUTINE W REFLEX MICROSCOPIC      Component Value Range   Color, Urine ORANGE (*) YELLOW   APPearance TURBID (*) CLEAR   Specific Gravity, Urine 1.028  1.005 - 1.030   pH 7.0  5.0 - 8.0   Glucose, UA NEGATIVE  NEGATIVE mg/dL   Hgb  urine dipstick NEGATIVE  NEGATIVE   Bilirubin Urine LARGE (*) NEGATIVE   Ketones, ur 15 (*) NEGATIVE mg/dL    Protein, ur NEGATIVE  NEGATIVE mg/dL   Urobilinogen, UA 4.0 (*) 0.0 - 1.0 mg/dL   Nitrite POSITIVE (*) NEGATIVE   Leukocytes, UA SMALL (*) NEGATIVE  CBC WITH DIFFERENTIAL      Component Value Range   WBC 4.1  4.0 - 10.5 K/uL   RBC 3.38 (*) 3.87 - 5.11 MIL/uL   Hemoglobin 10.1 (*) 12.0 - 15.0 g/dL   HCT 91.4 (*) 78.2 - 95.6 %   MCV 91.4  78.0 - 100.0 fL   MCH 29.9  26.0 - 34.0 pg   MCHC 32.7  30.0 - 36.0 g/dL   RDW 21.3 (*) 08.6 - 57.8 %   Platelets 101 (*) 150 - 400 K/uL   Neutrophils Relative 67  43 - 77 %   Lymphocytes Relative 23  12 - 46 %   Monocytes Relative 7  3 - 12 %   Eosinophils Relative 2  0 - 5 %   Basophils Relative 1  0 - 1 %   Neutro Abs 2.8  1.7 - 7.7 K/uL   Lymphs Abs 0.9  0.7 - 4.0 K/uL   Monocytes Absolute 0.3  0.1 - 1.0 K/uL   Eosinophils Absolute 0.1  0.0 - 0.7 K/uL   Basophils Absolute 0.0  0.0 - 0.1 K/uL   RBC Morphology POLYCHROMASIA PRESENT     Smear Review LARGE PLATELETS PRESENT    COMPREHENSIVE METABOLIC PANEL      Component Value Range   Sodium 133 (*) 135 - 145 mEq/L   Potassium 3.8  3.5 - 5.1 mEq/L   Chloride 99  96 - 112 mEq/L   CO2 25  19 - 32 mEq/L   Glucose, Bld 94  70 - 99 mg/dL   BUN 10  6 - 23 mg/dL   Creatinine, Ser 4.69 (*) 0.50 - 1.10 mg/dL   Calcium 9.5  8.4 - 62.9 mg/dL   Total Protein 6.7  6.0 - 8.3 g/dL   Albumin 3.0 (*) 3.5 - 5.2 g/dL   AST 36  0 - 37 U/L   ALT 36 (*) 0 - 35 U/L   Alkaline Phosphatase 497 (*) 39 - 117 U/L   Total Bilirubin 5.2 (*) 0.3 - 1.2 mg/dL   GFR calc non Af Amer >90  >90 mL/min   GFR calc Af Amer >90  >90 mL/min  URINE MICROSCOPIC-ADD ON      Component Value Range   Squamous Epithelial / LPF MANY (*) RARE   WBC, UA 0-2  <3 WBC/hpf   Bacteria, UA MANY (*) RARE   Urine-Other MUCOUS PRESENT    PREGNANCY, URINE      Component Value Range   Preg Test, Ur NEGATIVE  NEGATIVE  WET PREP, GENITAL      Component Value Range   Yeast Wet Prep HPF POC MODERATE (*) NONE SEEN   Trich, Wet Prep NONE SEEN   NONE SEEN   Clue Cells Wet Prep HPF POC NONE SEEN  NONE SEEN   WBC, Wet Prep HPF POC RARE (*) NONE SEEN    No results found.   1. Candidiasis of the genitals, female   2. Cervicitis   3. Chlamydia contact, untreated       MDM  Patient presented with suprapubic pain history of Chlamydia exposure and confirmed by swabs taken on 09/27/11. Patient states that she never received  a phone call, informing her of this and to seek treatment. Wet prep remarkable for moderate yeast. CBC: remarkable for known anemia and thrombocytopenia due to long standing liver disease. CMP: remarkable for elevated transaminases unchanged, also consistent with known liver disease. UA: remarkable for positive nitrite and leukocyte. Patient given pain medication in ED with improvement. Patient treated for gc/chlamydia with Rocephin and Azithromycin. Patient treated for vulvovaginal candidiasis with diflucan and discharged with ABX for UTI. Pregnancy: negative. Patient informed of how chlamydia is transmitted and that she needs to inform her partner/s and she will receive a call. Patient encouraged to be tested for other STI's. No red flags for tuboovarian abscess, ectopic pregnancy, ovarian torsion, or Fitz-Hugh-Curtis. Return precautions given verbally and in discharge summary.        Pixie Casino, PA-C 10/19/11 [id number]

## 2011-10-19 NOTE — ED Notes (Signed)
Patient reports she has had abd pain for a couple of days.  She has burning today when she voided.  Patient denies any vaginal discharge, denies odor.

## 2011-10-20 NOTE — ED Provider Notes (Signed)
Medical screening examination/treatment/procedure(s) were conducted as a shared visit with non-physician practitioner(s) and myself.  I personally evaluated the patient during the encounter.  No acute abdomen. Nontoxic. Genitourinary exam and treatment per PA  Donnetta Hutching, MD 10/20/11 1756

## 2011-10-21 LAB — GC/CHLAMYDIA PROBE AMP, GENITAL
Chlamydia, DNA Probe: NEGATIVE
GC Probe Amp, Genital: NEGATIVE

## 2011-11-25 ENCOUNTER — Emergency Department (HOSPITAL_COMMUNITY)
Admission: EM | Admit: 2011-11-25 | Discharge: 2011-11-25 | Discharge status: Against Medical Advice | Payer: BC Managed Care – PPO | Attending: Emergency Medicine | Admitting: Emergency Medicine

## 2011-11-25 ENCOUNTER — Encounter (HOSPITAL_COMMUNITY): Payer: Self-pay

## 2011-11-25 DIAGNOSIS — Z8619 Personal history of other infectious and parasitic diseases: Secondary | ICD-10-CM | POA: Insufficient documentation

## 2011-11-25 DIAGNOSIS — R7401 Elevation of levels of liver transaminase levels: Secondary | ICD-10-CM | POA: Insufficient documentation

## 2011-11-25 DIAGNOSIS — F17200 Nicotine dependence, unspecified, uncomplicated: Secondary | ICD-10-CM | POA: Insufficient documentation

## 2011-11-25 DIAGNOSIS — Z79899 Other long term (current) drug therapy: Secondary | ICD-10-CM | POA: Insufficient documentation

## 2011-11-25 DIAGNOSIS — R7402 Elevation of levels of lactic acid dehydrogenase (LDH): Secondary | ICD-10-CM | POA: Insufficient documentation

## 2011-11-25 DIAGNOSIS — Z9884 Bariatric surgery status: Secondary | ICD-10-CM | POA: Insufficient documentation

## 2011-11-25 DIAGNOSIS — K652 Spontaneous bacterial peritonitis: Secondary | ICD-10-CM | POA: Insufficient documentation

## 2011-11-25 LAB — CBC WITH DIFFERENTIAL/PLATELET
Basophils Absolute: 0 10*3/uL (ref 0.0–0.1)
Basophils Relative: 0 % (ref 0–1)
Eosinophils Absolute: 0 10*3/uL (ref 0.0–0.7)
Eosinophils Relative: 0 % (ref 0–5)
HCT: 30.5 % — ABNORMAL LOW (ref 36.0–46.0)
Hemoglobin: 10 g/dL — ABNORMAL LOW (ref 12.0–15.0)
Lymphocytes Relative: 6 % — ABNORMAL LOW (ref 12–46)
Lymphs Abs: 0.5 10*3/uL — ABNORMAL LOW (ref 0.7–4.0)
MCH: 30.6 pg (ref 26.0–34.0)
MCHC: 32.8 g/dL (ref 30.0–36.0)
MCV: 93.3 fL (ref 78.0–100.0)
Monocytes Absolute: 1 10*3/uL (ref 0.1–1.0)
Monocytes Relative: 13 % — ABNORMAL HIGH (ref 3–12)
Neutro Abs: 6.1 10*3/uL (ref 1.7–7.7)
Neutrophils Relative %: 80 % — ABNORMAL HIGH (ref 43–77)
Platelets: 81 10*3/uL — ABNORMAL LOW (ref 150–400)
RBC: 3.27 MIL/uL — ABNORMAL LOW (ref 3.87–5.11)
RDW: 18.9 % — ABNORMAL HIGH (ref 11.5–15.5)
WBC: 7.7 10*3/uL (ref 4.0–10.5)

## 2011-11-25 LAB — URINALYSIS, ROUTINE W REFLEX MICROSCOPIC
Glucose, UA: NEGATIVE mg/dL
Ketones, ur: 15 mg/dL — AB
Nitrite: NEGATIVE
Protein, ur: NEGATIVE mg/dL
Specific Gravity, Urine: 1.027 (ref 1.005–1.030)
Urobilinogen, UA: 4 mg/dL — ABNORMAL HIGH (ref 0.0–1.0)
pH: 7.5 (ref 5.0–8.0)

## 2011-11-25 LAB — COMPREHENSIVE METABOLIC PANEL
ALT: 31 U/L (ref 0–35)
Albumin: 2.8 g/dL — ABNORMAL LOW (ref 3.5–5.2)
Alkaline Phosphatase: 543 U/L — ABNORMAL HIGH (ref 39–117)
Calcium: 8.5 mg/dL (ref 8.4–10.5)
Potassium: 3.1 mEq/L — ABNORMAL LOW (ref 3.5–5.1)
Sodium: 139 mEq/L (ref 135–145)
Total Protein: 6.6 g/dL (ref 6.0–8.3)

## 2011-11-25 LAB — URINE MICROSCOPIC-ADD ON

## 2011-11-25 LAB — POCT PREGNANCY, URINE: Preg Test, Ur: NEGATIVE

## 2011-11-25 LAB — AMMONIA: Ammonia: 60 umol/L (ref 11–60)

## 2011-11-25 LAB — LIPASE, BLOOD: Lipase: 17 U/L (ref 11–59)

## 2011-11-25 LAB — LACTIC ACID, PLASMA: Lactic Acid, Venous: 1.3 mmol/L (ref 0.5–2.2)

## 2011-11-25 MED ORDER — CIPROFLOXACIN HCL 500 MG PO TABS: 500.0000 mg | ORAL_TABLET | Freq: Two times a day (BID) | ORAL | Status: DC

## 2011-11-25 MED ORDER — SODIUM CHLORIDE 0.9 % IV BOLUS (SEPSIS)
500.0000 mL | Freq: Once | INTRAVENOUS | Status: AC
  Administered 2011-11-25: 500 mL via INTRAVENOUS

## 2011-11-25 MED ORDER — OXYCODONE-ACETAMINOPHEN 7.5-325 MG PO TABS: 1.0000 | ORAL_TABLET | ORAL | Status: DC | PRN

## 2011-11-25 MED ORDER — HYDROMORPHONE HCL PF 1 MG/ML IJ SOLN
1.0000 mg | Freq: Once | INTRAMUSCULAR | Status: AC
  Administered 2011-11-25: 1 mg via INTRAVENOUS
  Filled 2011-11-25: qty 1

## 2011-11-25 MED ORDER — ONDANSETRON HCL 4 MG/2ML IJ SOLN
4.0000 mg | Freq: Once | INTRAMUSCULAR | Status: AC
  Administered 2011-11-25: 4 mg via INTRAVENOUS
  Filled 2011-11-25: qty 2

## 2011-11-25 MED ORDER — DEXTROSE 5 % IV SOLN
1.0000 g | Freq: Once | INTRAVENOUS | Status: AC
  Administered 2011-11-25: 1 g via INTRAVENOUS
  Filled 2011-11-25: qty 10

## 2011-11-25 NOTE — ED Notes (Signed)
Pt with c/o abdominal pain, nausea and vomiting since last night, hot and cold

## 2011-11-25 NOTE — ED Provider Notes (Signed)
History     CSN: [id number]  Arrival date & time 11/25/11  1022   First MD Initiated Contact with Patient 11/25/11 1041      Chief Complaint  Patient presents with  . Abdominal Pain    HPI Patient presents with generalized abdominal pain which started last night.  Has had some nausea and vomiting.  Patient has had hot and cold flashes.  No definite chills.  No documented fever.  Patient has history of hepatitis. Past Medical History  Diagnosis Date  . Liver disease   . Hepatitis     Past Surgical History  Procedure Date  . Gastric banding port revision   . Tonsillectomy   . Esophagogastroduodenoscopy 07/22/2011    Procedure: ESOPHAGOGASTRODUODENOSCOPY (EGD);  Surgeon: Graylin Shiver, MD;  Location: [facility] ENDOSCOPY;  Service: Endoscopy;  Laterality: N/A;    No family history on file.  History  Substance Use Topics  . Smoking status: Current Some Day Smoker -- 1.0 packs/day for 2 years    Types: Cigarettes  . Smokeless tobacco: Not on file  . Alcohol Use: No    OB History    Grav Para Term Preterm Abortions TAB SAB Ect Mult Living                  Review of Systems All other systems reviewed and are negative Allergies  Review of patient's allergies indicates no known allergies.  Home Medications   Current Outpatient Rx  Name Route Sig Dispense Refill  . AZATHIOPRINE 50 MG PO TABS Oral Take 100 mg by mouth daily.     Marland Kitchen VITAMIN D PO Oral Take 1 capsule by mouth daily.    . ERGOCALCIFEROL 50000 UNITS PO CAPS Oral Take 50,000 Units by mouth once a week.    Marland Kitchen LACTULOSE 10 GM/15ML PO SOLN Oral Take 20 g by mouth 2 (two) times daily as needed. For constipation.    Marland Kitchen OMEPRAZOLE 20 MG PO CPDR Oral Take 20 mg by mouth daily.    Marland Kitchen PREDNISONE 5 MG PO TABS Oral Take 5 mg by mouth daily.    Marland Kitchen PROPRANOLOL HCL ER 60 MG PO CP24 Oral Take 60 mg by mouth daily.    . SULFAMETHOXAZOLE-TRIMETHOPRIM 800-160 MG PO TABS Oral Take 1 tablet by mouth every 12 (twelve) hours.    Marland Kitchen URSODIOL  300 MG PO CAPS Oral Take 300 mg by mouth 2 (two) times daily.     Marland Kitchen CIPROFLOXACIN HCL 500 MG PO TABS Oral Take 1 tablet (500 mg total) by mouth 2 (two) times daily. 20 tablet 0  . OXYCODONE-ACETAMINOPHEN 7.5-325 MG PO TABS Oral Take 1 tablet by mouth every 4 (four) hours as needed for pain. 30 tablet 0    BP 109/47  Pulse 94  Temp 99.3 F (37.4 C) (Oral)  Resp 18  SpO2 99%  LMP 11/18/2011  Physical Exam  Nursing note and vitals reviewed. Constitutional: She is oriented to person, place, and time. She appears well-developed and well-nourished. No distress.  HENT:  Head: Normocephalic and atraumatic.  Eyes: Pupils are equal, round, and reactive to light.  Neck: Normal range of motion.  Cardiovascular: Normal rate and intact distal pulses.   Pulmonary/Chest: No respiratory distress.  Abdominal: Normal appearance. She exhibits no distension. There is tenderness. There is rebound.    Musculoskeletal: Normal range of motion.  Neurological: She is alert and oriented to person, place, and time. No cranial nerve deficit.  Skin: Skin is warm and dry. No  rash noted.  Psychiatric: She has a normal mood and affect. Her behavior is normal.    ED Course  Procedures (including critical care time)  Bedside ultrasound revealed moderate amount of ascites in the abdomen.  Medications  ergocalciferol (VITAMIN D2) 50000 UNITS capsule (not administered)  sulfamethoxazole-trimethoprim (BACTRIM DS,SEPTRA DS) 800-160 MG per tablet (not administered)  ciprofloxacin (CIPRO) 500 MG tablet (not administered)  oxyCODONE-acetaminophen (PERCOCET) 7.5-325 MG per tablet (not administered)  ciprofloxacin (CIPRO) 500 MG tablet (not administered)  oxyCODONE-acetaminophen (PERCOCET) 7.5-325 MG per tablet (not administered)  sodium chloride 0.9 % bolus 500 mL (500 mL Intravenous New Bag/Given 11/25/11 1108)  ondansetron (ZOFRAN) injection 4 mg (4 mg Intravenous Given 11/25/11 1106)  HYDROmorphone (DILAUDID)  injection 1 mg (1 mg Intravenous Given 11/25/11 1108)  cefTRIAXone (ROCEPHIN) 1 g in dextrose 5 % 50 mL IVPB (1 g Intravenous New Bag/Given 11/25/11 1251)  HYDROmorphone (DILAUDID) injection 1 mg (1 mg Intravenous Given 11/25/11 1251)    Labs Reviewed  COMPREHENSIVE METABOLIC PANEL - Abnormal; Notable for the following:    Potassium 3.1 (*)     Creatinine, Ser 0.42 (*)     Albumin 2.8 (*)     AST 38 (*)     Alkaline Phosphatase 543 (*)     Total Bilirubin 5.0 (*)     All other components within normal limits  CBC WITH DIFFERENTIAL - Abnormal; Notable for the following:    RBC 3.27 (*)     Hemoglobin 10.0 (*)     HCT 30.5 (*)     RDW 18.9 (*)     Platelets 81 (*)     Neutrophils Relative 80 (*)     Lymphocytes Relative 6 (*)     Lymphs Abs 0.5 (*)     Monocytes Relative 13 (*)     All other components within normal limits  URINALYSIS, ROUTINE W REFLEX MICROSCOPIC - Abnormal; Notable for the following:    Color, Urine ORANGE (*)  BIOCHEMICALS MAY BE AFFECTED BY COLOR   APPearance HAZY (*)     Hgb urine dipstick TRACE (*)     Bilirubin Urine MODERATE (*)     Ketones, ur 15 (*)     Urobilinogen, UA 4.0 (*)     Leukocytes, UA SMALL (*)     All other components within normal limits  URINE MICROSCOPIC-ADD ON - Abnormal; Notable for the following:    Squamous Epithelial / LPF FEW (*)     All other components within normal limits  LIPASE, BLOOD  AMMONIA  LACTIC ACID, PLASMA  POCT PREGNANCY, URINE  CULTURE, BLOOD (ROUTINE X 2)  CULTURE, BLOOD (ROUTINE X 2)   No results found.   1. SBP (spontaneous bacterial peritonitis)   2. Transaminitis       MDM  I discussed the diagnosis of bacterial peritonitis.  Patient refuses to be admitted and will sign out AGAINST MEDICAL ADVICE.  Patient instructed on risks including death.  Patient will be given 1 g ceftriaxone and started on Cipro as outpatient.  Patient invited to return.  Will followup in [facility] or return here if  condition worsens.       Nelia Shi, MD 11/25/11 [phone]

## 2011-12-01 LAB — CULTURE, BLOOD (ROUTINE X 2): Culture: NO GROWTH

## 2012-04-05 ENCOUNTER — Encounter (HOSPITAL_COMMUNITY): Payer: Self-pay | Admitting: *Deleted

## 2012-04-05 ENCOUNTER — Emergency Department (HOSPITAL_COMMUNITY)
Admission: EM | Admit: 2012-04-05 | Discharge: 2012-04-05 | Discharge status: Against Medical Advice | Payer: BC Managed Care – PPO | Attending: Emergency Medicine | Admitting: Emergency Medicine

## 2012-04-05 DIAGNOSIS — R109 Unspecified abdominal pain: Secondary | ICD-10-CM | POA: Insufficient documentation

## 2012-04-05 DIAGNOSIS — R1110 Vomiting, unspecified: Secondary | ICD-10-CM | POA: Insufficient documentation

## 2012-04-05 DIAGNOSIS — F17200 Nicotine dependence, unspecified, uncomplicated: Secondary | ICD-10-CM | POA: Insufficient documentation

## 2012-04-05 LAB — COMPREHENSIVE METABOLIC PANEL
Albumin: 2.9 g/dL — ABNORMAL LOW (ref 3.5–5.2)
BUN: 7 mg/dL (ref 6–23)
Calcium: 8.4 mg/dL (ref 8.4–10.5)
GFR calc Af Amer: >90 mL/min (ref >90)
Glucose, Bld: 153 mg/dL — ABNORMAL HIGH (ref 70–99)
Potassium: 3.2 mEq/L — ABNORMAL LOW (ref 3.5–5.1)
Sodium: 134 mEq/L — ABNORMAL LOW (ref 135–145)
Total Protein: 6.5 g/dL (ref 6.0–8.3)

## 2012-04-05 LAB — CBC WITH DIFFERENTIAL/PLATELET
Basophils Relative: 0 % (ref 0–1)
Eosinophils Absolute: 0 10*3/uL (ref 0.0–0.7)
Eosinophils Relative: 0 % (ref 0–5)
Lymphs Abs: 0.4 10*3/uL — ABNORMAL LOW (ref 0.7–4.0)
MCH: 30.5 pg (ref 26.0–34.0)
MCHC: 34.5 g/dL (ref 30.0–36.0)
MCV: 88.3 fL (ref 78.0–100.0)
Neutrophils Relative %: 70 % (ref 43–77)
Platelets: DECREASED 10*3/uL (ref 150–400)
RBC: 3.51 MIL/uL — ABNORMAL LOW (ref 3.87–5.11)

## 2012-04-05 LAB — URINE MICROSCOPIC-ADD ON

## 2012-04-05 LAB — URINALYSIS, ROUTINE W REFLEX MICROSCOPIC
Glucose, UA: NEGATIVE mg/dL
Nitrite: POSITIVE — AB
Specific Gravity, Urine: 1.031 — ABNORMAL HIGH (ref 1.005–1.030)
pH: 6.5 (ref 5.0–8.0)

## 2012-04-05 LAB — POCT PREGNANCY, URINE: Preg Test, Ur: NEGATIVE

## 2012-04-05 LAB — LIPASE, BLOOD: Lipase: 31 U/L (ref 11–59)

## 2012-04-05 MED ORDER — ACETAMINOPHEN 325 MG PO TABS
650.0000 mg | ORAL_TABLET | Freq: Once | ORAL | Status: AC
  Administered 2012-04-05: 650 mg via ORAL
  Filled 2012-04-05: qty 2

## 2012-04-05 NOTE — ED Notes (Signed)
Pt states that she has been vomiting and having dirrhea for the past four days. Pt states that she has upper abdominal pain and a HA as well. Pt states that she is having chills for the past couple of days. Pt denies problems with urine.

## 2012-04-05 NOTE — ED Notes (Signed)
Pt called x3 times for placement in room. No response from pt x3.

## 2012-04-10 LAB — URINE CULTURE: Colony Count: 30000

## 2012-04-11 ENCOUNTER — Telehealth (HOSPITAL_COMMUNITY): Payer: Self-pay | Admitting: Emergency Medicine

## 2012-04-11 NOTE — ED Notes (Signed)
+   Urine Chart sent to EDP office for review. 

## 2012-04-11 NOTE — ED Notes (Signed)
Patient has +Urine culture. Checking to see if appropriately treated. °

## 2012-04-12 ENCOUNTER — Telehealth (HOSPITAL_COMMUNITY): Payer: Self-pay | Admitting: Emergency Medicine

## 2012-04-12 NOTE — ED Notes (Signed)
Unable to contact patient via phone. Sent letter. °

## 2012-04-12 NOTE — ED Notes (Signed)
Chart returned from EDP office. Per Marlon Pel PA-C, Macrobid 100 mg tab. 1 tab PO BID for 7 days. Disp #14.

## 2012-05-09 ENCOUNTER — Telehealth (HOSPITAL_COMMUNITY): Payer: Self-pay | Admitting: Emergency Medicine

## 2012-05-09 NOTE — ED Notes (Addendum)
No response to letter sent after 30 days. Chart sent to Medical Records. °

## 2012-09-28 ENCOUNTER — Emergency Department (HOSPITAL_COMMUNITY): Payer: BC Managed Care – PPO

## 2012-09-28 ENCOUNTER — Emergency Department (HOSPITAL_COMMUNITY)
Admission: EM | Admit: 2012-09-28 | Discharge: 2012-09-28 | Disposition: A | Discharge status: Home / Self Care | Payer: BC Managed Care – PPO | Attending: Emergency Medicine | Admitting: Emergency Medicine

## 2012-09-28 ENCOUNTER — Encounter (HOSPITAL_COMMUNITY): Payer: Self-pay | Admitting: Emergency Medicine

## 2012-09-28 DIAGNOSIS — Z91199 Patient's noncompliance with other medical treatment and regimen due to unspecified reason: Secondary | ICD-10-CM | POA: Insufficient documentation

## 2012-09-28 DIAGNOSIS — R1084 Generalized abdominal pain: Secondary | ICD-10-CM | POA: Insufficient documentation

## 2012-09-28 DIAGNOSIS — Z3202 Encounter for pregnancy test, result negative: Secondary | ICD-10-CM | POA: Insufficient documentation

## 2012-09-28 DIAGNOSIS — R112 Nausea with vomiting, unspecified: Secondary | ICD-10-CM | POA: Insufficient documentation

## 2012-09-28 DIAGNOSIS — R141 Gas pain: Secondary | ICD-10-CM | POA: Insufficient documentation

## 2012-09-28 DIAGNOSIS — Z9119 Patient's noncompliance with other medical treatment and regimen: Secondary | ICD-10-CM | POA: Insufficient documentation

## 2012-09-28 DIAGNOSIS — F17200 Nicotine dependence, unspecified, uncomplicated: Secondary | ICD-10-CM | POA: Insufficient documentation

## 2012-09-28 DIAGNOSIS — Z79899 Other long term (current) drug therapy: Secondary | ICD-10-CM | POA: Insufficient documentation

## 2012-09-28 DIAGNOSIS — F411 Generalized anxiety disorder: Secondary | ICD-10-CM | POA: Insufficient documentation

## 2012-09-28 DIAGNOSIS — R109 Unspecified abdominal pain: Secondary | ICD-10-CM

## 2012-09-28 DIAGNOSIS — R142 Eructation: Secondary | ICD-10-CM | POA: Insufficient documentation

## 2012-09-28 DIAGNOSIS — R61 Generalized hyperhidrosis: Secondary | ICD-10-CM | POA: Insufficient documentation

## 2012-09-28 DIAGNOSIS — K759 Inflammatory liver disease, unspecified: Secondary | ICD-10-CM | POA: Insufficient documentation

## 2012-09-28 DIAGNOSIS — R197 Diarrhea, unspecified: Secondary | ICD-10-CM | POA: Insufficient documentation

## 2012-09-28 DIAGNOSIS — N390 Urinary tract infection, site not specified: Secondary | ICD-10-CM

## 2012-09-28 LAB — CBC WITH DIFFERENTIAL/PLATELET
Eosinophils Absolute: 0.1 10*3/uL (ref 0.0–0.7)
Hemoglobin: 9.2 g/dL — ABNORMAL LOW (ref 12.0–15.0)
Lymphs Abs: 0.6 10*3/uL — ABNORMAL LOW (ref 0.7–4.0)
MCH: 29.6 pg (ref 26.0–34.0)
Monocytes Relative: 11 % (ref 3–12)
Neutro Abs: 4.9 10*3/uL (ref 1.7–7.7)
Neutrophils Relative %: 78 % — ABNORMAL HIGH (ref 43–77)
RBC: 3.11 MIL/uL — ABNORMAL LOW (ref 3.87–5.11)

## 2012-09-28 LAB — COMPREHENSIVE METABOLIC PANEL
ALT: 75 U/L — ABNORMAL HIGH (ref 0–35)
Alkaline Phosphatase: 756 U/L — ABNORMAL HIGH (ref 39–117)
CO2: 24 mEq/L (ref 19–32)
GFR calc Af Amer: >90 mL/min (ref >90)
GFR calc non Af Amer: >90 mL/min (ref >90)
Glucose, Bld: 88 mg/dL (ref 70–99)
Potassium: 3 mEq/L — ABNORMAL LOW (ref 3.5–5.1)
Sodium: 139 mEq/L (ref 135–145)

## 2012-09-28 LAB — URINALYSIS, ROUTINE W REFLEX MICROSCOPIC
Protein, ur: 100 mg/dL — AB
Urobilinogen, UA: 4 mg/dL — ABNORMAL HIGH (ref 0.0–1.0)

## 2012-09-28 LAB — CG4 I-STAT (LACTIC ACID): Lactic Acid, Venous: 1.59 mmol/L (ref 0.5–2.2)

## 2012-09-28 LAB — URINE MICROSCOPIC-ADD ON

## 2012-09-28 MED ORDER — HYDROMORPHONE HCL PF 1 MG/ML IJ SOLN
1.0000 mg | Freq: Once | INTRAMUSCULAR | Status: AC
  Administered 2012-09-28: 1 mg via INTRAVENOUS
  Filled 2012-09-28: qty 1

## 2012-09-28 MED ORDER — IOHEXOL 300 MG/ML  SOLN
100.0000 mL | Freq: Once | INTRAMUSCULAR | Status: AC | PRN
  Administered 2012-09-28: 100 mL via INTRAVENOUS

## 2012-09-28 MED ORDER — TRAMADOL HCL 50 MG PO TABS: 50.0000 mg | ORAL_TABLET | Freq: Four times a day (QID) | ORAL | Status: DC | PRN

## 2012-09-28 MED ORDER — SULFAMETHOXAZOLE-TMP DS 800-160 MG PO TABS
1.0000 | ORAL_TABLET | Freq: Once | ORAL | Status: AC
  Administered 2012-09-28: 1 via ORAL
  Filled 2012-09-28: qty 1

## 2012-09-28 MED ORDER — SULFAMETHOXAZOLE-TMP DS 800-160 MG PO TABS: 1.0000 | ORAL_TABLET | Freq: Once | ORAL | Status: AC

## 2012-09-28 MED ORDER — ONDANSETRON HCL 4 MG/2ML IJ SOLN
4.0000 mg | Freq: Once | INTRAMUSCULAR | Status: AC
  Administered 2012-09-28: 4 mg via INTRAVENOUS
  Filled 2012-09-28: qty 2

## 2012-09-28 MED ORDER — SODIUM CHLORIDE 0.9 % IV SOLN
1000.0000 mL | Freq: Once | INTRAVENOUS | Status: AC
  Administered 2012-09-28: 1000 mL via INTRAVENOUS

## 2012-09-28 MED ORDER — IOHEXOL 300 MG/ML  SOLN: 25.0000 mL | INTRAMUSCULAR | Status: AC

## 2012-09-28 NOTE — ED Notes (Signed)
Pt continues to drink PO contrast. Warm blanket given.  Aware of need for urine specimen.   Call bell within reach.

## 2012-09-28 NOTE — ED Notes (Signed)
Sharp upper abdominal pain since yesterday. Vomiting several times yesterday. Once with dark blood. Denies vomiting today  - diarrhea.  Saw PMD approx. 1 month ago and still waiting on results??

## 2012-09-28 NOTE — ED Provider Notes (Signed)
CSN: [id number]     Arrival date & time 09/28/12  0703 History   First MD Initiated Contact with Patient 09/28/12 [phone]     Chief Complaint  Patient presents with  . Abdominal Pain   (Consider location/radiation/quality/duration/timing/severity/associated sxs/prior Treatment) HPI Patient presents with one day of abdominal pain.  History of present illness is per the patient's boyfriend, as she is in extreme pain, offering limited responses to questions. It seems as though her pain began yesterday.  Since that time there's been pain persistently throughout the abdomen.  There is associated nausea, with multiple episodes of emesis, including once with a dark material. Patient has been noncompliant with medication. Notably, the patient has autoimmune hepatitis. No report of new fever, confusion, disorientation, dyspnea. No clear alleviating or exacerbating factors.  Past Medical History  Diagnosis Date  . Liver disease   . Hepatitis    Past Surgical History  Procedure Laterality Date  . Gastric banding port revision    . Tonsillectomy    . Esophagogastroduodenoscopy  07/22/2011    Procedure: ESOPHAGOGASTRODUODENOSCOPY (EGD);  Surgeon: Graylin Shiver, MD;  Location: [facility] ENDOSCOPY;  Service: Endoscopy;  Laterality: N/A;   History reviewed. No pertinent family history. History  Substance Use Topics  . Smoking status: Current Some Day Smoker -- 1.00 packs/day for 2 years    Types: Cigarettes  . Smokeless tobacco: Not on file  . Alcohol Use: No   OB History   Grav Para Term Preterm Abortions TAB SAB Ect Mult Living                 Review of Systems  Constitutional:       Per HPI, otherwise negative  HENT:       Per HPI, otherwise negative  Respiratory:       Per HPI, otherwise negative  Cardiovascular:       Per HPI, otherwise negative  Gastrointestinal: Positive for nausea, vomiting, abdominal pain and diarrhea.  Endocrine:       Negative aside from HPI  Genitourinary:   Neg aside from HPI   Musculoskeletal:       Per HPI, otherwise negative  Skin: Negative.   Neurological: Negative for syncope.    Allergies  Review of patient's allergies indicates no known allergies.  Home Medications   Current Outpatient Rx  Name  Route  Sig  Dispense  Refill  . azaTHIOprine (IMURAN) 50 MG tablet   Oral   Take 100 mg by mouth daily.          . Cholecalciferol (VITAMIN D PO)   Oral   Take 1 capsule by mouth daily.         . ergocalciferol (VITAMIN D2) 50000 UNITS capsule   Oral   Take 50,000 Units by mouth once a week.         . lactulose (CHRONULAC) 10 GM/15ML solution   Oral   Take 20 g by mouth 2 (two) times daily as needed. For constipation.         Marland Kitchen omeprazole (PRILOSEC) 20 MG capsule   Oral   Take 20 mg by mouth daily.         . predniSONE (DELTASONE) 5 MG tablet   Oral   Take 5 mg by mouth daily.         . propranolol (INDERAL LA) 60 MG 24 hr capsule   Oral   Take 60 mg by mouth daily.  BP 146/78  Pulse 108  Temp(Src) 98 F (36.7 C) (Oral)  Resp 30  SpO2 100% Physical Exam  Nursing note and vitals reviewed. Constitutional: She is oriented to person, place, and time. She appears well-developed and well-nourished. She appears distressed.  HENT:  Head: Normocephalic and atraumatic.  Eyes: Conjunctivae and EOM are normal.  Cardiovascular: Normal rate and regular rhythm.   Pulmonary/Chest: Effort normal and breath sounds normal. No stridor. No respiratory distress.  Abdominal: She exhibits distension. There is generalized tenderness. There is guarding.  Diffuse tenderness throughout the abdomen, with guarding.  Musculoskeletal: She exhibits no edema.  Neurological: She is alert and oriented to person, place, and time. She is not disoriented. No cranial nerve deficit.  Skin: Skin is warm. She is diaphoretic.  Psychiatric: Her mood appears anxious. Her speech is delayed.    ED Course  Procedures (including  critical care time) Labs Review Labs Reviewed  CBC WITH DIFFERENTIAL  COMPREHENSIVE METABOLIC PANEL  LIPASE, BLOOD  URINALYSIS, ROUTINE W REFLEX MICROSCOPIC  PREGNANCY, URINE   Imaging Review No results found.  pulse oximetry 99% room air normal  Update: Patient substantially better after initial Dilaudid.  Portable chest x-ray does not demonstrated pneumoperitoneum.  Initial lactic acid level is unremarkable  12:50 PM Patient is awake alert, ambulatory.  We discussed all results thus far.  She has minimal ongoing pain. Patient will have ultrasound paracentesis tomorrow. MDM  No diagnosis found. This patient presents with abdominal pain.  Notably, the patient has autoimmune hepatitis, and exam has a diffusely tender abdomen.  Given her presentation, there is some suspicion for peritonitis versus pneumoperitoneum, though x-ray, CT were reassuring for the low probability of these entities.  However, patient does have significant ascites, and evidence of a urinary tract infection.  There is low suspicion for bacterial peritonitis given the absence of lactic acidosis, the absence of fever, the absence of distress following analgesia.  However, with the burden of ascites, outpatient ultrasound paracentesis is arranged.  Patient was started on antibiotics, discharged in unremarkable condition    Gerhard Munch, MD 09/28/12 1252

## 2012-09-28 NOTE — ED Notes (Signed)
Pt done drinking contrast. Sleeping in bed with boyfriend. Reports she feels better. Requesting ice chips. Made aware she is NPO until results of CT.

## 2012-09-28 NOTE — ED Notes (Signed)
Lactic acid shown to Dr. Jeraldine Loots

## 2012-09-29 ENCOUNTER — Ambulatory Visit (HOSPITAL_COMMUNITY)
Admission: RE | Admit: 2012-09-29 | Discharge: 2012-09-29 | Disposition: A | Discharge status: Home / Self Care | Payer: BC Managed Care – PPO | Source: Ambulatory Visit | Attending: Emergency Medicine | Admitting: Emergency Medicine

## 2012-09-29 ENCOUNTER — Other Ambulatory Visit: Payer: Self-pay | Admitting: Radiology

## 2012-09-29 ENCOUNTER — Other Ambulatory Visit (HOSPITAL_COMMUNITY): Payer: Self-pay | Admitting: Emergency Medicine

## 2012-09-29 DIAGNOSIS — R188 Other ascites: Secondary | ICD-10-CM | POA: Insufficient documentation

## 2012-09-29 DIAGNOSIS — R109 Unspecified abdominal pain: Secondary | ICD-10-CM

## 2012-09-29 DIAGNOSIS — K754 Autoimmune hepatitis: Secondary | ICD-10-CM | POA: Insufficient documentation

## 2012-09-29 LAB — BODY FLUID CELL COUNT WITH DIFFERENTIAL: Neutrophil Count, Fluid: 84 % — ABNORMAL HIGH (ref 0–25)

## 2012-09-29 LAB — GLUCOSE, SEROUS FLUID: Glucose, Fluid: 114 mg/dL

## 2012-09-29 LAB — URINE CULTURE: Colony Count: 65000

## 2012-09-29 NOTE — Procedures (Signed)
Successful US guided paracentesis from RLQ.  Yielded 3 Liters of yellow fluid.  No immediate complications.  Pt tolerated well.   Specimen was sent for labs.  Pattricia Boss D PA-C 09/29/2012 2:35 PM

## 2012-10-03 LAB — BODY FLUID CULTURE: Culture: NO GROWTH

## 2013-01-26 ENCOUNTER — Emergency Department (HOSPITAL_COMMUNITY)
Admission: EM | Admit: 2013-01-26 | Discharge: 2013-01-26 | Disposition: A | Discharge status: Home / Self Care | Payer: BC Managed Care – PPO | Attending: Emergency Medicine | Admitting: Emergency Medicine

## 2013-01-26 ENCOUNTER — Encounter (HOSPITAL_COMMUNITY): Payer: Self-pay | Admitting: Emergency Medicine

## 2013-01-26 ENCOUNTER — Emergency Department (HOSPITAL_COMMUNITY): Payer: BC Managed Care – PPO

## 2013-01-26 DIAGNOSIS — IMO0002 Reserved for concepts with insufficient information to code with codable children: Secondary | ICD-10-CM | POA: Insufficient documentation

## 2013-01-26 DIAGNOSIS — F17200 Nicotine dependence, unspecified, uncomplicated: Secondary | ICD-10-CM | POA: Insufficient documentation

## 2013-01-26 DIAGNOSIS — Z79899 Other long term (current) drug therapy: Secondary | ICD-10-CM | POA: Insufficient documentation

## 2013-01-26 DIAGNOSIS — N390 Urinary tract infection, site not specified: Secondary | ICD-10-CM | POA: Insufficient documentation

## 2013-01-26 DIAGNOSIS — D649 Anemia, unspecified: Secondary | ICD-10-CM | POA: Insufficient documentation

## 2013-01-26 DIAGNOSIS — Z862 Personal history of diseases of the blood and blood-forming organs and certain disorders involving the immune mechanism: Secondary | ICD-10-CM | POA: Insufficient documentation

## 2013-01-26 DIAGNOSIS — R188 Other ascites: Secondary | ICD-10-CM

## 2013-01-26 DIAGNOSIS — Z3202 Encounter for pregnancy test, result negative: Secondary | ICD-10-CM | POA: Insufficient documentation

## 2013-01-26 DIAGNOSIS — K219 Gastro-esophageal reflux disease without esophagitis: Secondary | ICD-10-CM | POA: Insufficient documentation

## 2013-01-26 DIAGNOSIS — R109 Unspecified abdominal pain: Secondary | ICD-10-CM

## 2013-01-26 HISTORY — DX: Anemia, unspecified: D64.9

## 2013-01-26 LAB — CBC WITH DIFFERENTIAL/PLATELET
Basophils Absolute: 0 10*3/uL (ref 0.0–0.1)
Basophils Relative: 0 % (ref 0–1)
Eosinophils Absolute: 0 10*3/uL (ref 0.0–0.7)
Eosinophils Relative: 0 % (ref 0–5)
HCT: 24.7 % — ABNORMAL LOW (ref 36.0–46.0)
Hemoglobin: 7.9 g/dL — ABNORMAL LOW (ref 12.0–15.0)
Lymphocytes Relative: 6 % — ABNORMAL LOW (ref 12–46)
MCH: 29.5 pg (ref 26.0–34.0)
MCHC: 32 g/dL (ref 30.0–36.0)
Monocytes Absolute: 0.9 10*3/uL (ref 0.1–1.0)
Neutro Abs: 4.6 10*3/uL (ref 1.7–7.7)
RBC: 2.68 MIL/uL — ABNORMAL LOW (ref 3.87–5.11)
RDW: 21.5 % — ABNORMAL HIGH (ref 11.5–15.5)
WBC: 5.9 10*3/uL (ref 4.0–10.5)

## 2013-01-26 LAB — URINALYSIS, ROUTINE W REFLEX MICROSCOPIC
Hgb urine dipstick: NEGATIVE
Ketones, ur: 15 mg/dL — AB
Nitrite: POSITIVE — AB
Protein, ur: NEGATIVE mg/dL
Specific Gravity, Urine: 1.036 — ABNORMAL HIGH (ref 1.005–1.030)
Urobilinogen, UA: 1 mg/dL (ref 0.0–1.0)

## 2013-01-26 LAB — COMPREHENSIVE METABOLIC PANEL
BUN: 10 mg/dL (ref 6–23)
CO2: 22 mEq/L (ref 19–32)
Calcium: 8.2 mg/dL — ABNORMAL LOW (ref 8.4–10.5)
Creatinine, Ser: 0.56 mg/dL (ref 0.50–1.10)
GFR calc Af Amer: >90 mL/min (ref >90)
GFR calc non Af Amer: >90 mL/min (ref >90)
Glucose, Bld: 105 mg/dL — ABNORMAL HIGH (ref 70–99)
Sodium: 138 mEq/L (ref 137–147)
Total Protein: 6.1 g/dL (ref 6.0–8.3)

## 2013-01-26 LAB — LIPASE, BLOOD: Lipase: 22 U/L (ref 11–59)

## 2013-01-26 LAB — URINE MICROSCOPIC-ADD ON

## 2013-01-26 LAB — POCT PREGNANCY, URINE: Preg Test, Ur: NEGATIVE

## 2013-01-26 MED ORDER — CEFTRIAXONE SODIUM 1 G IJ SOLR
1.0000 g | Freq: Once | INTRAMUSCULAR | Status: AC
  Administered 2013-01-26: 1 g via INTRAVENOUS
  Filled 2013-01-26: qty 10

## 2013-01-26 MED ORDER — SODIUM CHLORIDE 0.9 % IV BOLUS (SEPSIS)
1000.0000 mL | Freq: Once | INTRAVENOUS | Status: AC
  Administered 2013-01-26: 1000 mL via INTRAVENOUS

## 2013-01-26 MED ORDER — IOHEXOL 300 MG/ML  SOLN
25.0000 mL | INTRAMUSCULAR | Status: AC
  Administered 2013-01-26: 25 mL via ORAL

## 2013-01-26 MED ORDER — IOHEXOL 300 MG/ML  SOLN
80.0000 mL | Freq: Once | INTRAMUSCULAR | Status: AC | PRN
  Administered 2013-01-26: 80 mL via INTRAVENOUS

## 2013-01-26 MED ORDER — HYDROCODONE-ACETAMINOPHEN 5-325 MG PO TABS: 1.0000 | ORAL_TABLET | Freq: Four times a day (QID) | ORAL | Status: DC | PRN

## 2013-01-26 MED ORDER — NITROFURANTOIN MONOHYD MACRO 100 MG PO CAPS: 100.0000 mg | ORAL_CAPSULE | Freq: Two times a day (BID) | ORAL | Status: DC

## 2013-01-26 MED ORDER — ONDANSETRON HCL 4 MG/2ML IJ SOLN
4.0000 mg | Freq: Once | INTRAMUSCULAR | Status: AC
  Administered 2013-01-26: 4 mg via INTRAVENOUS
  Filled 2013-01-26: qty 2

## 2013-01-26 MED ORDER — MORPHINE SULFATE 4 MG/ML IJ SOLN
6.0000 mg | Freq: Once | INTRAMUSCULAR | Status: AC
  Administered 2013-01-26: 6 mg via INTRAVENOUS
  Filled 2013-01-26: qty 2

## 2013-01-26 NOTE — ED Notes (Addendum)
C/o LUQ pain & nausea since yesterday. Denies emesis, diarrhea, urinary symptoms, black/tarry, bloody stools. Reports mvmt makes pain worse, decreased PO intake due to increased pain after eating & feeling full.

## 2013-01-26 NOTE — Discharge Instructions (Signed)
Follow up with your doctor for a recheck. Increase your fluid intake. You have a urinary tract infection and some ascites in your abdomen

## 2013-01-26 NOTE — ED Provider Notes (Signed)
CSN: [id number]     Arrival date & time 01/26/13  4098 History   First MD Initiated Contact with Patient 01/26/13 0725     Chief Complaint  Patient presents with  . Abdominal Pain   (Consider location/radiation/quality/duration/timing/severity/associated sxs/prior Treatment) HPI Patient presents emergency department with abdominal pain, that started yesterday.  The patient, states, that his lower abdominal pain, but spreads across her entire abdomen.  The patient, states, that she has chronic liver disease, and anemia.  The patient denies chest pain, shortness of breath, nausea, vomiting, diarrhea, headache, blurred vision, weakness, dizziness, fever, cough, or syncope.  The patient, states she did not take any medications prior to arrival.  Patient, states, that nothing seems to make her condition, better and palpation makes the pain, worse Past Medical History  Diagnosis Date  . Liver disease   . Hepatitis   . Anemia    Past Surgical History  Procedure Laterality Date  . Gastric banding port revision    . Tonsillectomy    . Esophagogastroduodenoscopy  07/22/2011    Procedure: ESOPHAGOGASTRODUODENOSCOPY (EGD);  Surgeon: Graylin Shiver, MD;  Location: [facility] ENDOSCOPY;  Service: Endoscopy;  Laterality: N/A;   No family history on file. History  Substance Use Topics  . Smoking status: Current Some Day Smoker -- 1.00 packs/day for 2 years    Types: Cigarettes  . Smokeless tobacco: Not on file  . Alcohol Use: No   OB History   Grav Para Term Preterm Abortions TAB SAB Ect Mult Living                 Review of Systems All other systems negative except as documented in the HPI. All pertinent positives and negatives as reviewed in the HPI. Allergies  Review of patient's allergies indicates no known allergies.  Home Medications   Current Outpatient Rx  Name  Route  Sig  Dispense  Refill  . azaTHIOprine (IMURAN) 50 MG tablet   Oral   Take 100 mg by mouth daily.          .  Cholecalciferol (VITAMIN D PO)   Oral   Take 1 capsule by mouth daily.         . ergocalciferol (VITAMIN D2) 50000 UNITS capsule   Oral   Take 50,000 Units by mouth once a week.         . lactulose (CHRONULAC) 10 GM/15ML solution   Oral   Take 20 g by mouth 2 (two) times daily as needed. For constipation.         Marland Kitchen omeprazole (PRILOSEC) 20 MG capsule   Oral   Take 20 mg by mouth daily.         . predniSONE (DELTASONE) 5 MG tablet   Oral   Take 5 mg by mouth daily.         . propranolol (INDERAL LA) 60 MG 24 hr capsule   Oral   Take 60 mg by mouth daily.         . traMADol (ULTRAM) 50 MG tablet   Oral   Take 1 tablet (50 mg total) by mouth every 6 (six) hours as needed for pain.   15 tablet   0    BP 111/69  Pulse 83  Temp(Src) 98.2 F (36.8 C) (Oral)  Resp 17  SpO2 100%  LMP 11/11/2012 Physical Exam  Nursing note and vitals reviewed. Constitutional: She is oriented to person, place, and time. She appears well-developed and well-nourished. No distress.  HENT:  Head: Normocephalic and atraumatic.  Mouth/Throat: Oropharynx is clear and moist.  Eyes: Pupils are equal, round, and reactive to light.  Neck: Normal range of motion. Neck supple.  Cardiovascular: Normal rate, regular rhythm and normal heart sounds.  Exam reveals no gallop and no friction rub.   No murmur heard. Pulmonary/Chest: Effort normal and breath sounds normal. No respiratory distress.  Abdominal: Soft. Normal appearance and bowel sounds are normal. She exhibits no distension. There is generalized tenderness. There is no rebound and no guarding.  Neurological: She is alert and oriented to person, place, and time. She exhibits normal muscle tone. Coordination normal.  Skin: Skin is warm and dry. No rash noted. No erythema.    ED Course  Procedures (including critical care time) Labs Review Labs Reviewed  CBC WITH DIFFERENTIAL - Abnormal; Notable for the following:    RBC 2.68 (*)     Hemoglobin 7.9 (*)    HCT 24.7 (*)    RDW 21.5 (*)    Platelets 44 (*)    Neutrophils Relative % 79 (*)    Lymphocytes Relative 6 (*)    Monocytes Relative 15 (*)    Lymphs Abs 0.4 (*)    All other components within normal limits  COMPREHENSIVE METABOLIC PANEL - Abnormal; Notable for the following:    Glucose, Bld 105 (*)    Calcium 8.2 (*)    Albumin 2.6 (*)    AST 55 (*)    ALT 44 (*)    Alkaline Phosphatase 393 (*)    Total Bilirubin 3.0 (*)    All other components within normal limits  URINALYSIS, ROUTINE W REFLEX MICROSCOPIC - Abnormal; Notable for the following:    Color, Urine ORANGE (*)    APPearance CLOUDY (*)    Specific Gravity, Urine 1.036 (*)    Bilirubin Urine MODERATE (*)    Ketones, ur 15 (*)    Nitrite POSITIVE (*)    Leukocytes, UA SMALL (*)    All other components within normal limits  URINE MICROSCOPIC-ADD ON - Abnormal; Notable for the following:    Squamous Epithelial / LPF MANY (*)    Bacteria, UA MANY (*)    All other components within normal limits  LIPASE, BLOOD  POCT PREGNANCY, URINE   Imaging Review Ct Abdomen Pelvis W Contrast  01/26/2013   CLINICAL DATA:  Abdominal pain, left upper quadrant pain, nausea  EXAM: CT ABDOMEN AND PELVIS WITH CONTRAST  TECHNIQUE: Multidetector CT imaging of the abdomen and pelvis was performed using the standard protocol following bolus administration of intravenous contrast.  CONTRAST:  80mL OMNIPAQUE IOHEXOL 300 MG/ML  SOLN  COMPARISON:  09/28/2012  FINDINGS: Lung bases are unremarkable. Small hiatal hernia. Sagittal images of the spine are unremarkable. No aortic aneurysm. Again cirrhosis of the liver with nodular contour. Splenomegaly with spleen measuring at least 13.7 by 8 cm. Moderate abdominal and pelvic ascites. 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 Portal vein measures 1.4 cm in diameter consistent with portal hypertension. The pancreas and adrenal glands are unremarkable. No calcified gallstones are  noted within gallbladder. No small bowel or colonic obstruction. The terminal ileum is unremarkable. Small umbilical hernia containing fluid without evidence of acute complication. The uterus and adnexa are unremarkable. Nonspecific mild thickening of urinary bladder wall. No distal colonic obstruction. Moderate pelvic free fluid. Normal appendix partially visualized in coronal image 27.  Kidneys are symmetrical in size and enhancement. No hydronephrosis or hydroureter. No focal renal mass.  IMPRESSION: 1. Again noted cirrhosis of the liver, splenomegaly and portal hypertension. 2. Moderate abdominal and pelvic ascites. 3. Small umbilical hernia containing fluid without evidence of acute complication. 4. No hydronephrosis or hydroureter. 5. Nonspecific thickening of urinary bladder wall. Chronic inflammation or cystitis cannot be excluded. 6. Small hiatal hernia.   Electronically Signed   By: Natasha Mead M.D.   On: 01/26/2013 11:36    Patient is advised to return here as needed.  She is told to increase her fluid intake, and rest as much possible.  Told to follow up with her primary care Dr. for recheck.  Patient be treated for urinary tract infection.  She does have moderate ascites noted on CT.Marland Kitchen  Patient is advised.  Findings to return here as needed    Carlyle Dolly, PA-C 01/26/13 1401

## 2013-01-26 NOTE — ED Notes (Signed)
Pt. arrived with [facility] reports LLQ pain onset yesterday , denies nausea or vomitting , denies diarrhea .

## 2013-01-26 NOTE — ED Notes (Signed)
Finished drinking contrast, CT notified

## 2013-01-26 NOTE — ED Notes (Signed)
Patient transported to CT 

## 2013-01-27 NOTE — ED Provider Notes (Signed)
Medical screening examination/treatment/procedure(s) were performed by non-physician practitioner and as supervising physician I was immediately available for consultation/collaboration.  EKG Interpretation   None         Azlin Zilberman L Telesa Jeancharles, MD 01/27/13 1451 

## 2013-05-26 ENCOUNTER — Encounter (HOSPITAL_COMMUNITY): Payer: Self-pay | Admitting: Emergency Medicine

## 2013-05-26 DIAGNOSIS — R1110 Vomiting, unspecified: Secondary | ICD-10-CM | POA: Insufficient documentation

## 2013-05-26 LAB — CBC WITH DIFFERENTIAL/PLATELET
BASOS ABS: 0 10*3/uL (ref 0.0–0.1)
Basophils Relative: 0 % (ref 0–1)
EOS PCT: 1 % (ref 0–5)
Eosinophils Absolute: 0.1 10*3/uL (ref 0.0–0.7)
HEMATOCRIT: 23.8 % — AB (ref 36.0–46.0)
Hemoglobin: 7.8 g/dL — ABNORMAL LOW (ref 12.0–15.0)
LYMPHS ABS: 0.7 10*3/uL (ref 0.7–4.0)
Lymphocytes Relative: 10 % — ABNORMAL LOW (ref 12–46)
MCH: 29.8 pg (ref 26.0–34.0)
MCHC: 32.8 g/dL (ref 30.0–36.0)
MCV: 90.8 fL (ref 78.0–100.0)
Monocytes Absolute: 0.2 10*3/uL (ref 0.1–1.0)
Monocytes Relative: 3 % (ref 3–12)
Neutro Abs: 6.3 10*3/uL (ref 1.7–7.7)
Neutrophils Relative %: 86 % — ABNORMAL HIGH (ref 43–77)
Platelets: 102 10*3/uL — ABNORMAL LOW (ref 150–400)
RBC: 2.62 MIL/uL — ABNORMAL LOW (ref 3.87–5.11)
RDW: 22.3 % — AB (ref 11.5–15.5)
WBC: 7.3 10*3/uL (ref 4.0–10.5)

## 2013-05-26 LAB — COMPREHENSIVE METABOLIC PANEL
ALBUMIN: 2.9 g/dL — AB (ref 3.5–5.2)
ALK PHOS: 312 U/L — AB (ref 39–117)
ALT: 82 U/L — AB (ref 0–35)
AST: 85 U/L — ABNORMAL HIGH (ref 0–37)
BUN: 18 mg/dL (ref 6–23)
CO2: 26 mEq/L (ref 19–32)
Calcium: 8.6 mg/dL (ref 8.4–10.5)
Chloride: 96 mEq/L (ref 96–112)
Creatinine, Ser: 0.77 mg/dL (ref 0.50–1.10)
GFR calc Af Amer: >90 mL/min (ref >90)
GFR calc non Af Amer: >90 mL/min (ref >90)
Glucose, Bld: 140 mg/dL — ABNORMAL HIGH (ref 70–99)
POTASSIUM: 3.8 meq/L (ref 3.7–5.3)
SODIUM: 135 meq/L — AB (ref 137–147)
Total Bilirubin: 3.1 mg/dL — ABNORMAL HIGH (ref 0.3–1.2)
Total Protein: 6.2 g/dL (ref 6.0–8.3)

## 2013-05-26 LAB — LIPASE, BLOOD: Lipase: 46 U/L (ref 11–59)

## 2013-05-26 NOTE — ED Notes (Signed)
Pt did not answer for re-assessment. 

## 2013-05-26 NOTE — ED Notes (Addendum)
Pt reports she threw up today twice and had clots in it. Reports she has liver disease; scelera yellowed. Stomach pains since yesterday. [facility] Pt had gastric band placed last month.

## 2013-05-27 ENCOUNTER — Encounter (HOSPITAL_COMMUNITY): Payer: Self-pay | Admitting: Emergency Medicine

## 2013-05-27 ENCOUNTER — Emergency Department (HOSPITAL_COMMUNITY): Payer: BC Managed Care – PPO

## 2013-05-27 ENCOUNTER — Emergency Department (HOSPITAL_COMMUNITY)
Admission: EM | Admit: 2013-05-27 | Discharge: 2013-05-27 | Disposition: A | Discharge status: Home / Self Care | Payer: BC Managed Care – PPO | Attending: Emergency Medicine | Admitting: Emergency Medicine

## 2013-05-27 ENCOUNTER — Emergency Department (HOSPITAL_COMMUNITY)
Admission: EM | Admit: 2013-05-27 | Discharge: 2013-05-27 | Discharge status: Against Medical Advice | Payer: BC Managed Care – PPO | Attending: Emergency Medicine | Admitting: Emergency Medicine

## 2013-05-27 DIAGNOSIS — IMO0002 Reserved for concepts with insufficient information to code with codable children: Secondary | ICD-10-CM | POA: Insufficient documentation

## 2013-05-27 DIAGNOSIS — Z79899 Other long term (current) drug therapy: Secondary | ICD-10-CM | POA: Insufficient documentation

## 2013-05-27 DIAGNOSIS — F17200 Nicotine dependence, unspecified, uncomplicated: Secondary | ICD-10-CM | POA: Insufficient documentation

## 2013-05-27 DIAGNOSIS — I959 Hypotension, unspecified: Secondary | ICD-10-CM

## 2013-05-27 DIAGNOSIS — R112 Nausea with vomiting, unspecified: Secondary | ICD-10-CM

## 2013-05-27 DIAGNOSIS — K754 Autoimmune hepatitis: Secondary | ICD-10-CM

## 2013-05-27 DIAGNOSIS — K921 Melena: Secondary | ICD-10-CM | POA: Insufficient documentation

## 2013-05-27 DIAGNOSIS — Z9889 Other specified postprocedural states: Secondary | ICD-10-CM | POA: Insufficient documentation

## 2013-05-27 DIAGNOSIS — R197 Diarrhea, unspecified: Secondary | ICD-10-CM

## 2013-05-27 DIAGNOSIS — Z3202 Encounter for pregnancy test, result negative: Secondary | ICD-10-CM | POA: Insufficient documentation

## 2013-05-27 DIAGNOSIS — R109 Unspecified abdominal pain: Secondary | ICD-10-CM

## 2013-05-27 LAB — POC OCCULT BLOOD, ED: Fecal Occult Bld: POSITIVE — AB

## 2013-05-27 LAB — CBC WITH DIFFERENTIAL/PLATELET
Basophils Absolute: 0.1 10*3/uL (ref 0.0–0.1)
Basophils Relative: 1 % (ref 0–1)
Eosinophils Absolute: 0.1 10*3/uL (ref 0.0–0.7)
Eosinophils Relative: 2 % (ref 0–5)
HCT: 24.1 % — ABNORMAL LOW (ref 36.0–46.0)
Hemoglobin: 7.8 g/dL — ABNORMAL LOW (ref 12.0–15.0)
Lymphocytes Relative: 15 % (ref 12–46)
Lymphs Abs: 0.9 10*3/uL (ref 0.7–4.0)
MCH: 30.4 pg (ref 26.0–34.0)
MCHC: 32.4 g/dL (ref 30.0–36.0)
MCV: 93.8 fL (ref 78.0–100.0)
Monocytes Absolute: 0.2 10*3/uL (ref 0.1–1.0)
Monocytes Relative: 3 % (ref 3–12)
Neutro Abs: 4.5 K/uL (ref 1.7–7.7)
Neutrophils Relative %: 79 % — ABNORMAL HIGH (ref 43–77)
Platelets: 67 10*3/uL — ABNORMAL LOW (ref 150–400)
RBC: 2.57 MIL/uL — ABNORMAL LOW (ref 3.87–5.11)
RDW: 23 % — ABNORMAL HIGH (ref 11.5–15.5)
WBC: 5.8 10*3/uL (ref 4.0–10.5)

## 2013-05-27 LAB — COMPREHENSIVE METABOLIC PANEL
ALT: 80 U/L — ABNORMAL HIGH (ref 0–35)
AST: 71 U/L — ABNORMAL HIGH (ref 0–37)
Albumin: 3 g/dL — ABNORMAL LOW (ref 3.5–5.2)
BUN: 16 mg/dL (ref 6–23)
CO2: 26 mEq/L (ref 19–32)
Calcium: 8.9 mg/dL (ref 8.4–10.5)
Creatinine, Ser: 0.67 mg/dL (ref 0.50–1.10)
GFR calc Af Amer: >90 mL/min (ref >90)
Glucose, Bld: 95 mg/dL (ref 70–99)
Sodium: 136 mEq/L — ABNORMAL LOW (ref 137–147)
Total Protein: 6.5 g/dL (ref 6.0–8.3)

## 2013-05-27 LAB — COMPREHENSIVE METABOLIC PANEL WITH GFR
Alkaline Phosphatase: 313 U/L — ABNORMAL HIGH (ref 39–117)
Chloride: 97 meq/L (ref 96–112)
GFR calc non Af Amer: >90 mL/min (ref >90)
Potassium: 3.5 meq/L — ABNORMAL LOW (ref 3.7–5.3)
Total Bilirubin: 3.6 mg/dL — ABNORMAL HIGH (ref 0.3–1.2)

## 2013-05-27 LAB — URINE MICROSCOPIC-ADD ON

## 2013-05-27 LAB — I-STAT CG4 LACTIC ACID, ED: Lactic Acid, Venous: 1.05 mmol/L (ref 0.5–2.2)

## 2013-05-27 LAB — URINALYSIS, ROUTINE W REFLEX MICROSCOPIC
Glucose, UA: NEGATIVE mg/dL
Hgb urine dipstick: NEGATIVE
Ketones, ur: 15 mg/dL — AB
Nitrite: NEGATIVE
Protein, ur: NEGATIVE mg/dL
Specific Gravity, Urine: 1.024 (ref 1.005–1.030)
Urobilinogen, UA: >8 mg/dL — ABNORMAL HIGH (ref 0.0–1.0)
pH: 7 (ref 5.0–8.0)

## 2013-05-27 LAB — LIPASE, BLOOD: Lipase: 36 U/L (ref 11–59)

## 2013-05-27 LAB — POC URINE PREG, ED: Preg Test, Ur: NEGATIVE

## 2013-05-27 MED ORDER — ONDANSETRON HCL 4 MG/2ML IJ SOLN
4.0000 mg | Freq: Once | INTRAMUSCULAR | Status: AC
  Administered 2013-05-27: 4 mg via INTRAVENOUS
  Filled 2013-05-27: qty 2

## 2013-05-27 MED ORDER — FENTANYL CITRATE 0.05 MG/ML IJ SOLN
50.0000 ug | Freq: Once | INTRAMUSCULAR | Status: AC
  Administered 2013-05-27: 50 ug via INTRAVENOUS
  Filled 2013-05-27: qty 2

## 2013-05-27 MED ORDER — SODIUM CHLORIDE 0.9 % IV BOLUS (SEPSIS)
1000.0000 mL | Freq: Once | INTRAVENOUS | Status: AC
Start: 2013-05-27 — End: 2013-05-27
  Administered 2013-05-27: 1000 mL via INTRAVENOUS

## 2013-05-27 NOTE — ED Notes (Signed)
Lactic acid results given to Dr. Ghim 

## 2013-05-27 NOTE — ED Notes (Signed)
Pt wanting to leave AMA. Dr Fayrene FearingJames aware. Pt informed of risks of leaving, including death. Pt verbalizes understanding. Pt appears in nad. States she feels better

## 2013-05-27 NOTE — ED Notes (Signed)
Pt called to a room x3. No response.

## 2013-05-27 NOTE — ED Provider Notes (Signed)
CSN: [id number]     Arrival date & time 05/27/13  1308 History   First MD Initiated Contact with Patient 05/27/13 1414     Chief Complaint  Patient presents with  . Diarrhea  . Emesis     (Consider location/radiation/quality/duration/timing/severity/associated sxs/prior Treatment) Patient is a 23 y.o. female presenting with diarrhea and vomiting. The history is provided by the patient and medical records.  Diarrhea Quality:  Black and tarry and semi-solid Severity:  Moderate Onset quality:  Gradual Duration:  3 days Timing:  Constant Progression:  Worsening Associated symptoms: abdominal pain, myalgias and vomiting   Associated symptoms: no chills, no recent cough and no fever   Abdominal pain:    Location:  Generalized   Quality:  Aching, throbbing and gnawing   Severity:  Moderate   Onset quality:  Gradual   Duration:  3 days Vomiting:    Quality:  Stomach contents   Severity:  Moderate   Duration:  3 days   Timing:  Intermittent   Progression:  Unchanged Risk factors: no recent antibiotic use, no sick contacts and no suspicious food intake   Emesis Associated symptoms: abdominal pain, diarrhea and myalgias   Associated symptoms: no chills and no cough     Past Medical History  Diagnosis Date  . Liver disease   . Hepatitis   . Anemia    Past Surgical History  Procedure Laterality Date  . Gastric banding port revision    . Tonsillectomy    . Esophagogastroduodenoscopy  07/22/2011    Procedure: ESOPHAGOGASTRODUODENOSCOPY (EGD);  Surgeon: Graylin ShiverSalem F Ganem, MD;  [facility];  Service: Endoscopy;  Laterality: N/A;   History reviewed. No pertinent family history. History  Substance Use Topics  . Smoking status: Current Some Day Smoker -- 1.00 packs/day for 2 years    Types: Cigarettes  . Smokeless tobacco: Not on file  . Alcohol Use: No   OB History   Grav Para Term Preterm Abortions TAB SAB Ect Mult Living                 Review of Systems   Constitutional: Positive for fatigue. Negative for fever and chills.  Respiratory: Negative for shortness of breath.   Gastrointestinal: Positive for vomiting, abdominal pain, diarrhea and blood in stool.  Musculoskeletal: Positive for myalgias. Negative for back pain.  Neurological: Positive for weakness.  All other systems reviewed and are negative.     Allergies  Review of patient's allergies indicates no known allergies.  Home Medications   Prior to Admission medications   Medication Sig Start Date End Date Taking? Authorizing Provider  azaTHIOprine (IMURAN) 50 MG tablet Take 100 mg by mouth daily.    Yes Historical Provider, MD  Cholecalciferol (VITAMIN D PO) Take 1 capsule by mouth daily.   Yes Historical Provider, MD  ergocalciferol (VITAMIN D2) 50000 UNITS capsule Take 50,000 Units by mouth once a week.   Yes Historical Provider, MD  HYDROcodone-acetaminophen (NORCO/VICODIN) 5-325 MG per tablet Take 1 tablet by mouth every 6 (six) hours as needed for moderate pain. 01/26/13  Yes Jamesetta Orleanshristopher W Lawyer, PA-C  lactulose (CHRONULAC) 10 GM/15ML solution Take 20 g by mouth 2 (two) times daily as needed. For constipation.   Yes Historical Provider, MD  omeprazole (PRILOSEC) 20 MG capsule Take 20 mg by mouth daily.   Yes Historical Provider, MD  predniSONE (DELTASONE) 5 MG tablet Take 5 mg by mouth daily.   Yes Historical Provider, MD  propranolol (INDERAL LA) 60  MG 24 hr capsule Take 60 mg by mouth daily.   Yes Historical Provider, MD   BP 87/52  Pulse 75  Temp(Src) 97.2 F (36.2 C) (Oral)  Resp 18  SpO2 98%  LMP 04/28/2013 Physical Exam  Nursing note and vitals reviewed. Constitutional: She appears well-developed and well-nourished. No distress.  HENT:  Head: Normocephalic and atraumatic.  Eyes: EOM are normal. Scleral icterus is present.  Neck: Normal range of motion. Neck supple.  Cardiovascular: Normal rate.   Pulmonary/Chest: Effort normal.  Abdominal: Soft. There is  hepatomegaly. There is tenderness. There is guarding. There is no rebound.  Neurological: She is alert.  Skin: Skin is warm and dry. She is not diaphoretic. No cyanosis.  Psychiatric: She has a normal mood and affect.    ED Course  Procedures (including critical care time) Labs Review Labs Reviewed  CBC WITH DIFFERENTIAL  COMPREHENSIVE METABOLIC PANEL  LIPASE, BLOOD  URINALYSIS, ROUTINE W REFLEX MICROSCOPIC  POC URINE PREG, ED  I-STAT CG4 LACTIC ACID, ED  POC OCCULT BLOOD, ED    Imaging Review No results found.   EKG Interpretation At time 14:01 shows SR at rate 77, normal axis, ST elevation, likely early repol.  Otherwise WNL.         RA sat is 98% and I interpret to be adequate  4:30 PM Pt yet to receive analgesics.  Plain films pending.  GI bleeding, N/V/D for 3 days, anemic.  May need admission. Signed out to Dr. Fayrene FearingJames for reassessment and repeat exam, disposition.     MDM   Final diagnoses:  Abdominal pain  Autoimmune hepatitis  Nausea vomiting and diarrhea  Hypotension    Pt with diffusely tender abdomen, do not think it is surgical, more diffuse, will get labs including hepatic enzymes, PT, check for rectal bleeding, provide IVF's, and IV analgesics, IV antiemetics and monitor for improvement.        Gavin PoundMichael Y. Oletta LamasGhim, MD 05/28/13 [phone]

## 2013-05-27 NOTE — ED Notes (Signed)
N/v/d x 3 days was here yesterday but left stating she did not want to stay, [facility]

## 2013-05-28 ENCOUNTER — Encounter (HOSPITAL_COMMUNITY): Payer: Self-pay | Admitting: Emergency Medicine

## 2013-05-28 ENCOUNTER — Emergency Department (HOSPITAL_COMMUNITY)
Admission: EM | Admit: 2013-05-28 | Discharge: 2013-05-28 | Discharge status: Against Medical Advice | Payer: BC Managed Care – PPO | Attending: Emergency Medicine | Admitting: Emergency Medicine

## 2013-05-28 DIAGNOSIS — K769 Liver disease, unspecified: Secondary | ICD-10-CM | POA: Insufficient documentation

## 2013-05-28 DIAGNOSIS — Z79899 Other long term (current) drug therapy: Secondary | ICD-10-CM | POA: Insufficient documentation

## 2013-05-28 DIAGNOSIS — Z862 Personal history of diseases of the blood and blood-forming organs and certain disorders involving the immune mechanism: Secondary | ICD-10-CM | POA: Insufficient documentation

## 2013-05-28 DIAGNOSIS — R231 Pallor: Secondary | ICD-10-CM | POA: Insufficient documentation

## 2013-05-28 DIAGNOSIS — R42 Dizziness and giddiness: Secondary | ICD-10-CM | POA: Insufficient documentation

## 2013-05-28 DIAGNOSIS — F17200 Nicotine dependence, unspecified, uncomplicated: Secondary | ICD-10-CM | POA: Insufficient documentation

## 2013-05-28 DIAGNOSIS — IMO0002 Reserved for concepts with insufficient information to code with codable children: Secondary | ICD-10-CM | POA: Insufficient documentation

## 2013-05-28 DIAGNOSIS — K922 Gastrointestinal hemorrhage, unspecified: Secondary | ICD-10-CM | POA: Insufficient documentation

## 2013-05-28 DIAGNOSIS — K7290 Hepatic failure, unspecified without coma: Secondary | ICD-10-CM

## 2013-05-28 DIAGNOSIS — R5381 Other malaise: Secondary | ICD-10-CM | POA: Insufficient documentation

## 2013-05-28 DIAGNOSIS — R5383 Other fatigue: Secondary | ICD-10-CM

## 2013-05-28 DIAGNOSIS — R197 Diarrhea, unspecified: Secondary | ICD-10-CM | POA: Insufficient documentation

## 2013-05-28 MED ORDER — SODIUM CHLORIDE 0.9 % IV BOLUS (SEPSIS): 500.0000 mL | Freq: Once | INTRAVENOUS | Status: DC

## 2013-05-28 NOTE — ED Provider Notes (Signed)
CSN: [id number]     Arrival date & time 05/28/13  1104 History   First MD Initiated Contact with Patient 05/28/13 1219     Chief Complaint  Patient presents with  . GI Bleeding     (Consider location/radiation/quality/duration/timing/severity/associated sxs/prior Treatment) HPI 23 year old female with a history of underlying immune hepatitis with history of esophageal varices who presents today complaining of 2 day history of abdominal pain with hematemesis x2 yesterday. She states that this is typical of her variceal bleeding episodes. She states it began with some abdominal pain and cramping usually followed by dark stooling. She states that yesterday she had 2 episodes of vomiting blood and then had 5 episodes of coffee ground colored stool. She was seen here and evaluated secondary that. She was noted to be anemic. She was given pain medicine and felt better. She has been home and has continued to feel improved with no more vomiting or dark stooling. However, she told her mother of the episode today and her mother called the liver transplant physicians [facility] and was told to come to the nearest hospital. She is feeling somewhat weak and lightheaded. She had not eaten or drank today at the time that her mother told her she needed to come to the hospital. She has not had anything secondary to waiting to be evaluated. She is currently expressing that she is thirsty. Past Medical History  Diagnosis Date  . Liver disease   . Hepatitis   . Anemia    Past Surgical History  Procedure Laterality Date  . Gastric banding port revision    . Tonsillectomy    . Esophagogastroduodenoscopy  07/22/2011    Procedure: ESOPHAGOGASTRODUODENOSCOPY (EGD);  Surgeon: Graylin ShiverSalem F Ganem, MD;  [facility];  Service: Endoscopy;  Laterality: N/A;   History reviewed. No pertinent family history. History  Substance Use Topics  . Smoking status: Current Some Day Smoker -- 1.00 packs/day for  2 years    Types: Cigarettes  . Smokeless tobacco: Not on file  . Alcohol Use: No   OB History   Grav Para Term Preterm Abortions TAB SAB Ect Mult Living                 Review of Systems  Constitutional: Positive for activity change and appetite change. Negative for fever and unexpected weight change.  HENT: Negative for congestion.   Eyes: Negative for discharge.  Respiratory: Negative for cough, choking, chest tightness and shortness of breath.   Cardiovascular: Negative for chest pain and leg swelling.  Gastrointestinal: Positive for abdominal pain and diarrhea. Negative for abdominal distention.  Endocrine: Negative.   Genitourinary: Negative.   Musculoskeletal: Negative.   Skin: Negative.   Neurological: Positive for light-headedness.  Hematological: Negative.   Psychiatric/Behavioral: Negative.       Allergies  Review of patient's allergies indicates no known allergies.  Home Medications   Prior to Admission medications   Medication Sig Start Date End Date Taking? Authorizing Provider  azaTHIOprine (IMURAN) 50 MG tablet Take 100 mg by mouth daily.    Yes Historical Provider, MD  Cholecalciferol (VITAMIN D PO) Take 1 capsule by mouth daily.   Yes Historical Provider, MD  ergocalciferol (VITAMIN D2) 50000 UNITS capsule Take 50,000 Units by mouth once a week.   Yes Historical Provider, MD  HYDROcodone-acetaminophen (NORCO/VICODIN) 5-325 MG per tablet Take 1 tablet by mouth every 6 (six) hours as needed for moderate pain. 01/26/13  Yes Carlyle Dollyhristopher W Lawyer, PA-C  lactulose (CHRONULAC) 10 GM/15ML solution Take 20 g by mouth 2 (two) times daily as needed. For constipation.   Yes Historical Provider, MD  omeprazole (PRILOSEC) 20 MG capsule Take 20 mg by mouth daily.   Yes Historical Provider, MD  predniSONE (DELTASONE) 5 MG tablet Take 5 mg by mouth daily.   Yes Historical Provider, MD  propranolol (INDERAL LA) 60 MG 24 hr capsule Take 60 mg by mouth daily.   Yes Historical  Provider, MD   BP 99/55  Pulse 81  Temp(Src) 98.4 F (36.9 C) (Oral)  Resp 16  Ht 4\' 9"  (1.448 m)  Wt 97 lb 4.8 oz (44.135 kg)  BMI 21.05 kg/m2  SpO2 100%  LMP 04/28/2013 Physical Exam  Nursing note and vitals reviewed. Constitutional: She is oriented to person, place, and time. She appears well-developed and well-nourished.  HENT:  Head: Normocephalic and atraumatic.  Right Ear: External ear normal.  Left Ear: External ear normal.  Mouth/Throat: Oropharynx is clear and moist.  Eyes: EOM are normal. Pupils are equal, round, and reactive to light.  Conjunctiva are pale  Neck: Normal range of motion. Neck supple.  Cardiovascular: Normal rate, regular rhythm, normal heart sounds and intact distal pulses.   Pulmonary/Chest: Effort normal.  Abdominal: Soft. Bowel sounds are normal. There is no tenderness. There is no rebound.  Musculoskeletal: Normal range of motion.  Neurological: She is alert and oriented to person, place, and time. She has normal reflexes. She displays normal reflexes. No cranial nerve deficit. She exhibits normal muscle tone. Coordination normal.  Skin: Skin is warm and dry. There is pallor.  Psychiatric: She has a normal mood and affect. Her behavior is normal. Judgment and thought content normal.    ED Course  Procedures (including critical care time) Labs Review Labs Reviewed  CBC  COMPREHENSIVE METABOLIC PANEL  PROTIME-INR    Imaging Review Dg Abd Acute W/chest  05/27/2013   CLINICAL DATA:  Abdominal pain for 3 days. Nausea and vomiting. Hepatic cirrhosis.  EXAM: ACUTE ABDOMEN SERIES (ABDOMEN 2 VIEW & CHEST 1 VIEW)  COMPARISON:  CT abdomen and pelvis 01/26/2013  FINDINGS: The heart, mediastinal, and hilar contours are normal. The trachea is midline. Pulmonary vascularity is normal. The lungs are well expanded and clear.  Negative for free intraperitoneal air. Bowel gas pattern is nonobstructive.  Left psoas margin not visualized, for which the  possibility of ascites cannot be excluded. No acute osseous abnormality.  IMPRESSION: 1. Nonobstructive bowel gas pattern. 2. No acute cardiopulmonary disease. 3. Left psoas margin not visualized, for which ascites cannot be excluded in this patient with known hepatic cirrhosis.   Electronically Signed   By: Britta Mccreedy M.D.   On: 05/27/2013 17:46     EKG Interpretation None      MDM   Final diagnoses:  Hepatic failure  Upper GI bleed    Patient had Hemoccult yesterday that was positive. She also had a pregnancy test done and plain x-rays obtained. Hemoglobin is stable today at 7.8 which is what it was yesterday and is stable from December.  Patient states that she is thirsty and she was to have something to drink. She has decided that she wishes to leave. I discussed with her and her mother that her Stoffel she'll varices are extremely unstable condition and I would like to recheck labs. Her mother states that she thought that this was happening today, and the patient had been evaluated yesterday, they did not feel that they need to  stay. I discussed her anemia and her low blood pressures. I've advised him to stay for further evaluation. I discussed with them that they should return if she is worse at any time especially if she has any return of bleeding symptoms. They voice understanding and will call with her doctor.  Hilario Quarryanielle S Katherine Syme, MD 05/28/13 [phone]

## 2013-05-28 NOTE — ED Notes (Signed)
Pt has vomited blood twice this week. She is concerned because [facility].

## 2013-05-28 NOTE — ED Notes (Addendum)
[facility]. Pt was evaluated here yesterday for blood in emesis and melena. Discharged home. Mother is present with patient with concerns for child. Is requesting a provider to [facility] to relay care the child has received.   Patient has no new symptoms or complaints. Denies emesis, denies blood in stool, denies abdominal pain.   Mother requesting blood work to be held until seen by provider.

## 2013-05-28 NOTE — Discharge Instructions (Signed)
Please return if you have any signs of return of bleeding. [facility].

## 2013-05-28 NOTE — ED Notes (Signed)
Family at bedside. 

## 2013-10-10 ENCOUNTER — Emergency Department (HOSPITAL_COMMUNITY)
Admission: EM | Admit: 2013-10-10 | Discharge: 2013-10-10 | Discharge status: Against Medical Advice | Payer: BC Managed Care – PPO | Attending: Emergency Medicine | Admitting: Emergency Medicine

## 2013-10-10 ENCOUNTER — Encounter (HOSPITAL_COMMUNITY): Payer: Self-pay | Admitting: Emergency Medicine

## 2013-10-10 DIAGNOSIS — R1033 Periumbilical pain: Secondary | ICD-10-CM

## 2013-10-10 DIAGNOSIS — K7460 Unspecified cirrhosis of liver: Secondary | ICD-10-CM | POA: Insufficient documentation

## 2013-10-10 DIAGNOSIS — Z3202 Encounter for pregnancy test, result negative: Secondary | ICD-10-CM | POA: Insufficient documentation

## 2013-10-10 DIAGNOSIS — IMO0002 Reserved for concepts with insufficient information to code with codable children: Secondary | ICD-10-CM | POA: Diagnosis not present

## 2013-10-10 DIAGNOSIS — R110 Nausea: Secondary | ICD-10-CM | POA: Insufficient documentation

## 2013-10-10 DIAGNOSIS — I8510 Secondary esophageal varices without bleeding: Secondary | ICD-10-CM

## 2013-10-10 DIAGNOSIS — Z79899 Other long term (current) drug therapy: Secondary | ICD-10-CM | POA: Insufficient documentation

## 2013-10-10 DIAGNOSIS — I8511 Secondary esophageal varices with bleeding: Secondary | ICD-10-CM | POA: Diagnosis not present

## 2013-10-10 DIAGNOSIS — F17200 Nicotine dependence, unspecified, uncomplicated: Secondary | ICD-10-CM | POA: Diagnosis not present

## 2013-10-10 DIAGNOSIS — Z862 Personal history of diseases of the blood and blood-forming organs and certain disorders involving the immune mechanism: Secondary | ICD-10-CM | POA: Insufficient documentation

## 2013-10-10 DIAGNOSIS — K920 Hematemesis: Secondary | ICD-10-CM | POA: Diagnosis present

## 2013-10-10 LAB — CBC WITH DIFFERENTIAL/PLATELET
Basophils Absolute: 0 10*3/uL (ref 0.0–0.1)
Basophils Relative: 0 % (ref 0–1)
EOS ABS: 0.1 10*3/uL (ref 0.0–0.7)
Eosinophils Relative: 4 % (ref 0–5)
HCT: 30.2 % — ABNORMAL LOW (ref 36.0–46.0)
Hemoglobin: 9.7 g/dL — ABNORMAL LOW (ref 12.0–15.0)
LYMPHS ABS: 0.5 10*3/uL — AB (ref 0.7–4.0)
LYMPHS PCT: 17 % (ref 12–46)
MCH: 29.5 pg (ref 26.0–34.0)
MCHC: 32.1 g/dL (ref 30.0–36.0)
MCV: 91.8 fL (ref 78.0–100.0)
MONOS PCT: 8 % (ref 3–12)
Monocytes Absolute: 0.3 10*3/uL (ref 0.1–1.0)
Neutro Abs: 2.3 10*3/uL (ref 1.7–7.7)
Neutrophils Relative %: 71 % (ref 43–77)
Platelets: 118 10*3/uL — ABNORMAL LOW (ref 150–400)
RBC: 3.29 MIL/uL — AB (ref 3.87–5.11)
RDW: 19.8 % — ABNORMAL HIGH (ref 11.5–15.5)
WBC: 3.2 10*3/uL — ABNORMAL LOW (ref 4.0–10.5)

## 2013-10-10 LAB — URINE MICROSCOPIC-ADD ON

## 2013-10-10 LAB — URINALYSIS, ROUTINE W REFLEX MICROSCOPIC
Glucose, UA: NEGATIVE mg/dL
Hgb urine dipstick: NEGATIVE
Ketones, ur: NEGATIVE mg/dL
NITRITE: NEGATIVE
PH: 6.5 (ref 5.0–8.0)
Protein, ur: 30 mg/dL — AB
Specific Gravity, Urine: 1.024 (ref 1.005–1.030)
Urobilinogen, UA: 1 mg/dL (ref 0.0–1.0)

## 2013-10-10 LAB — COMPREHENSIVE METABOLIC PANEL
ALT: 58 U/L — ABNORMAL HIGH (ref 0–35)
AST: 79 U/L — AB (ref 0–37)
Albumin: 3.1 g/dL — ABNORMAL LOW (ref 3.5–5.2)
Alkaline Phosphatase: 471 U/L — ABNORMAL HIGH (ref 39–117)
Anion gap: 13 (ref 5–15)
BUN: 9 mg/dL (ref 6–23)
CALCIUM: 8.9 mg/dL (ref 8.4–10.5)
CO2: 22 mEq/L (ref 19–32)
Chloride: 98 mEq/L (ref 96–112)
Creatinine, Ser: 0.56 mg/dL (ref 0.50–1.10)
GFR calc non Af Amer: >90 mL/min (ref >90)
Glucose, Bld: 84 mg/dL (ref 70–99)
Potassium: 3.6 mEq/L — ABNORMAL LOW (ref 3.7–5.3)
SODIUM: 133 meq/L — AB (ref 137–147)
TOTAL PROTEIN: 7 g/dL (ref 6.0–8.3)
Total Bilirubin: 4 mg/dL — ABNORMAL HIGH (ref 0.3–1.2)

## 2013-10-10 LAB — LIPASE, BLOOD: Lipase: 32 U/L (ref 11–59)

## 2013-10-10 LAB — POC URINE PREG, ED: PREG TEST UR: NEGATIVE

## 2013-10-10 MED ORDER — MORPHINE SULFATE 4 MG/ML IJ SOLN
4.0000 mg | Freq: Once | INTRAMUSCULAR | Status: AC
  Administered 2013-10-10: 4 mg via INTRAVENOUS
  Filled 2013-10-10: qty 1

## 2013-10-10 MED ORDER — ONDANSETRON HCL 4 MG/2ML IJ SOLN
4.0000 mg | Freq: Once | INTRAMUSCULAR | Status: AC
  Administered 2013-10-10: 4 mg via INTRAVENOUS
  Filled 2013-10-10: qty 2

## 2013-10-10 NOTE — Discharge Instructions (Signed)
Return to the emergency room with worsening of symptoms, new symptoms or with symptoms that are concerning, continued blood in vomit, if you feel lightheaded, dizziness, severe abdominal pain or are unable to keep down fluids or foods. Please call your doctor for a followup appointment within 24-48 hours. When you talk to your doctor please let them know that you were seen in the emergency department and have them acquire all of your records so that they can discuss the findings with you and formulate a treatment plan to fully care for your new and ongoing problems. Follow up with your physician as soon as you can making any appointment in 1-2 days.

## 2013-10-10 NOTE — ED Provider Notes (Addendum)
CSN: [id number]     Arrival date & time 10/10/13  1311 History   First MD Initiated Contact with Patient 10/10/13 1359     Chief Complaint  Patient presents with  . Hematemesis     HPI RAHI CHANDONNET is a 23 y.o. 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 Patient vomited today and it was a cup of burgundy blood. Patient followed by Dr. Drue Novel, gastroenterologist at [facility]. Patient with anorexia but no changes in stool or hematochezia. Patient is a current smoker. Patient denies urinary changes.   Past Medical History  Diagnosis Date  . Liver disease   . Hepatitis   . Anemia    Past Surgical History  Procedure Laterality Date  . Gastric banding port revision    . Tonsillectomy    . Esophagogastroduodenoscopy  07/22/2011    Procedure: ESOPHAGOGASTRODUODENOSCOPY (EGD);  Surgeon: Wonda Horner, MD;  Location: [facility] ENDOSCOPY;  Service: Endoscopy;  Laterality: N/A;   History reviewed. No pertinent family history. History  Substance Use Topics  . Smoking status: Current Some Day Smoker -- 1.00 packs/day for 2 years    Types: Cigarettes  . Smokeless tobacco: Not on file  . Alcohol Use: No   OB History   Grav Para Term Preterm Abortions TAB SAB Ect Mult Living                 Review of Systems  Constitutional: Negative for fever and chills.  HENT: Negative for congestion and rhinorrhea.   Eyes: Negative for visual disturbance.  Respiratory: Negative for cough and shortness of breath.   Cardiovascular: Negative for chest pain and palpitations.  Gastrointestinal: Positive for nausea, vomiting, abdominal pain and abdominal distention. Negative for diarrhea.  Genitourinary: Negative for dysuria and hematuria.  Musculoskeletal: Negative for back pain and gait problem.  Skin:  Negative for rash.  Neurological: Negative for weakness and headaches.      Allergies  Review of patient's allergies indicates no known allergies.  Home Medications   Prior to Admission medications   Medication Sig Start Date End Date Taking? Authorizing Provider  azaTHIOprine (IMURAN) 50 MG tablet Take 100 mg by mouth daily.    Yes Historical Provider, MD  ergocalciferol (VITAMIN D2) 50000 UNITS capsule Take 50,000 Units by mouth once a week.   Yes Historical Provider, MD  lactulose (CHRONULAC) 10 GM/15ML solution Take 20 g by mouth 2 (two) times daily as needed (constipation).    Yes Historical Provider, MD  omeprazole (PRILOSEC) 20 MG capsule Take 20 mg by mouth daily.   Yes Historical Provider, MD  predniSONE (DELTASONE) 5 MG tablet Take 5 mg by mouth daily.   Yes Historical Provider, MD  propranolol (INDERAL LA) 60 MG 24 hr capsule Take 60 mg by mouth daily.   Yes Historical Provider, MD   BP 101/56  Pulse 63  Temp(Src) 97.8 F (36.6 C) (Oral)  Resp 14  SpO2 100%  LMP 08/28/2013 Physical Exam  Nursing note and vitals reviewed. Constitutional: She appears well-developed and well-nourished.  HENT:  Head: Normocephalic and atraumatic.  Eyes: Conjunctivae and EOM are normal. Pupils are equal, round, and reactive to light. Right eye exhibits no discharge. Left eye exhibits no discharge. Scleral icterus is present.  Cardiovascular: Normal rate, regular rhythm and normal heart sounds.   Pulmonary/Chest: Effort normal and  breath sounds normal. No respiratory distress. She has no wheezes.  Abdominal: Soft.  Mild abdominal distention with +BS, severe umbilical abdominal pain without rebound with voluntary guarding.   Musculoskeletal: Normal range of motion. She exhibits no tenderness.  No midline, R or L back pain. No CVA tenderness.  Neurological: She is alert. She exhibits normal muscle tone. Coordination normal.  Skin: Skin is warm and dry. She is not diaphoretic.  Psychiatric:  She has a normal mood and affect. Her behavior is normal.    ED Course  Procedures (including critical care time) Labs Review Labs Reviewed  COMPREHENSIVE METABOLIC PANEL - Abnormal; Notable for the following:    Sodium 133 (*)    Potassium 3.6 (*)    Albumin 3.1 (*)    AST 79 (*)    ALT 58 (*)    Alkaline Phosphatase 471 (*)    Total Bilirubin 4.0 (*)    All other components within normal limits  CBC WITH DIFFERENTIAL - Abnormal; Notable for the following:    WBC 3.2 (*)    RBC 3.29 (*)    Hemoglobin 9.7 (*)    HCT 30.2 (*)    RDW 19.8 (*)    Platelets 118 (*)    Lymphs Abs 0.5 (*)    All other components within normal limits  URINALYSIS, ROUTINE W REFLEX MICROSCOPIC - Abnormal; Notable for the following:    Color, Urine ORANGE (*)    APPearance TURBID (*)    Bilirubin Urine SMALL (*)    Protein, ur 30 (*)    Leukocytes, UA TRACE (*)    All other components within normal limits  URINE MICROSCOPIC-ADD ON - Abnormal; Notable for the following:    Squamous Epithelial / LPF MANY (*)    Bacteria, UA MANY (*)    All other components within normal limits  LIPASE, BLOOD  POC URINE PREG, ED  TYPE AND SCREEN    Imaging Review No results found.   EKG Interpretation None      Meds given in ED:  Medications  morphine 4 MG/ML injection 4 mg (4 mg Intravenous Given 10/10/13 1529)  ondansetron (ZOFRAN) injection 4 mg (4 mg Intravenous Given 10/10/13 1528)    New Prescriptions   No medications on file      MDM   Final diagnoses:  Hematemesis with nausea  Esophageal varices in cirrhosis  Periumbilical abdominal pain   Patient with severe cirrhosis and multiple varices band ligations who presents with hematemesis with nausea and abdominal pain. Pt on transplant list. Pt with 9.7 hgb which is normal for her, elevated AST, ALt and ALk phos with 8.0 total bili. Patient followed by GI in [facility] Dr. Drue Novel, who was consulted and agreed to transfer her to [place]  for further evaluation and treatment.  Discussed return precautions with patient. Discussed all results and patient verbalizes understanding and agrees with plan.  This is a shared patient. This patient was discussed with the physician, Dr. Audie Pinto who saw and evaluated the patient and agrees with the plan.  It was recommended repeatedly that the patient be admitted to the hospital for ongoing evaluation and treatment but the patient refused to both myself and the physician. The patient decided to leave [place] to go home. The indications for admission and the consequences of going home without ongoing treatment were discussed and the patient verbalized understanding. Return precautions were discussed in detail and the patient verbalized understanding.      [place]  Twanna Hy, PA-C 10/10/13 [place], PA-C 10/10/13 [phone]

## 2013-10-10 NOTE — ED Notes (Signed)
Pt reports vomiting dark blood x 2 day and has hx of liver disease. No acute distress noted at triage.

## 2013-10-10 NOTE — ED Notes (Signed)
Notified [facility] for transport

## 2013-10-10 NOTE — ED Provider Notes (Signed)
Medical screening examination/treatment/procedure(s) were conducted as a shared visit with non-physician practitioner(s) and myself.  I personally evaluated the patient during the encounter   .Face to face Exam:  General:  A&Ox3 HEENT:  Atraumatic Resp:  Normal effort Abd:  Nondistended Neuro:No focal deficits   Patient has been stabilized prior to transfer.  Both the gastroenterologist and an emergent medicine physician were spoken with at [facility] emergency room.  Patient will be transferred.   Nelia Shi, MD 10/10/13 [phone]

## 2013-10-11 NOTE — ED Provider Notes (Signed)
Medical screening examination/treatment/procedure(s) were conducted as a shared visit with non-physician practitioner(s) and myself.  I personally evaluated the patient during the encounter   .Face to face Exam:  General:  A&Ox3 HEENT:  Atraumatic Resp:  Normal effort Abd:  Nondistended Neuro:No focal deficits  After arranging transfer to [facility] for admission to her doctors the patient refused transfer and admission.  She was advised of risks, including the possibility of more bleeding and death.  She understands and has capacity to make decisions.  She refused and signed AMA form.  She was invited to return to ED fi she changes her mind.  Nelia Shi, MD 10/11/13 [phone]

## 2014-01-12 ENCOUNTER — Encounter (HOSPITAL_COMMUNITY): Payer: Self-pay | Admitting: Emergency Medicine

## 2014-01-12 ENCOUNTER — Emergency Department (HOSPITAL_COMMUNITY): Payer: BC Managed Care – PPO

## 2014-01-12 ENCOUNTER — Inpatient Hospital Stay (HOSPITAL_COMMUNITY)
Admission: EM | Admit: 2014-01-12 | Discharge: 2014-01-16 | DRG: 371 | Disposition: A | Discharge status: Home / Self Care | Payer: BC Managed Care – PPO | Attending: Internal Medicine | Admitting: Internal Medicine

## 2014-01-12 DIAGNOSIS — K830 Cholangitis: Secondary | ICD-10-CM | POA: Diagnosis present

## 2014-01-12 DIAGNOSIS — I864 Gastric varices: Secondary | ICD-10-CM | POA: Diagnosis present

## 2014-01-12 DIAGNOSIS — K754 Autoimmune hepatitis: Secondary | ICD-10-CM | POA: Diagnosis present

## 2014-01-12 DIAGNOSIS — K652 Spontaneous bacterial peritonitis: Principal | ICD-10-CM

## 2014-01-12 DIAGNOSIS — Y9289 Other specified places as the place of occurrence of the external cause: Secondary | ICD-10-CM

## 2014-01-12 DIAGNOSIS — E872 Acidosis, unspecified: Secondary | ICD-10-CM

## 2014-01-12 DIAGNOSIS — K3189 Other diseases of stomach and duodenum: Secondary | ICD-10-CM | POA: Diagnosis present

## 2014-01-12 DIAGNOSIS — K766 Portal hypertension: Secondary | ICD-10-CM | POA: Diagnosis present

## 2014-01-12 DIAGNOSIS — J029 Acute pharyngitis, unspecified: Secondary | ICD-10-CM | POA: Diagnosis present

## 2014-01-12 DIAGNOSIS — Z7682 Awaiting organ transplant status: Secondary | ICD-10-CM | POA: Diagnosis not present

## 2014-01-12 DIAGNOSIS — K5660 Unspecified intestinal obstruction: Secondary | ICD-10-CM | POA: Diagnosis present

## 2014-01-12 DIAGNOSIS — K7460 Unspecified cirrhosis of liver: Secondary | ICD-10-CM

## 2014-01-12 DIAGNOSIS — D649 Anemia, unspecified: Secondary | ICD-10-CM | POA: Diagnosis present

## 2014-01-12 DIAGNOSIS — R1030 Lower abdominal pain, unspecified: Secondary | ICD-10-CM | POA: Diagnosis present

## 2014-01-12 DIAGNOSIS — Z792 Long term (current) use of antibiotics: Secondary | ICD-10-CM | POA: Diagnosis not present

## 2014-01-12 DIAGNOSIS — R112 Nausea with vomiting, unspecified: Secondary | ICD-10-CM

## 2014-01-12 DIAGNOSIS — E871 Hypo-osmolality and hyponatremia: Secondary | ICD-10-CM | POA: Diagnosis present

## 2014-01-12 DIAGNOSIS — I8500 Esophageal varices without bleeding: Secondary | ICD-10-CM | POA: Diagnosis present

## 2014-01-12 DIAGNOSIS — R109 Unspecified abdominal pain: Secondary | ICD-10-CM | POA: Diagnosis present

## 2014-01-12 DIAGNOSIS — T502X5A Adverse effect of carbonic-anhydrase inhibitors, benzothiadiazides and other diuretics, initial encounter: Secondary | ICD-10-CM | POA: Diagnosis present

## 2014-01-12 DIAGNOSIS — E860 Dehydration: Secondary | ICD-10-CM | POA: Diagnosis present

## 2014-01-12 DIAGNOSIS — R188 Other ascites: Secondary | ICD-10-CM | POA: Diagnosis present

## 2014-01-12 DIAGNOSIS — D7289 Other specified disorders of white blood cells: Secondary | ICD-10-CM

## 2014-01-12 DIAGNOSIS — E43 Unspecified severe protein-calorie malnutrition: Secondary | ICD-10-CM | POA: Diagnosis present

## 2014-01-12 DIAGNOSIS — Z79899 Other long term (current) drug therapy: Secondary | ICD-10-CM | POA: Diagnosis not present

## 2014-01-12 DIAGNOSIS — R100 Acute abdomen: Secondary | ICD-10-CM

## 2014-01-12 LAB — CBC WITH DIFFERENTIAL/PLATELET
BASOS ABS: 0 10*3/uL (ref 0.0–0.1)
Basophils Relative: 0 % (ref 0–1)
Eosinophils Absolute: 0 10*3/uL (ref 0.0–0.7)
Eosinophils Relative: 0 % (ref 0–5)
HCT: 25.1 % — ABNORMAL LOW (ref 36.0–46.0)
Hemoglobin: 7.9 g/dL — ABNORMAL LOW (ref 12.0–15.0)
LYMPHS ABS: 0.3 10*3/uL — AB (ref 0.7–4.0)
Lymphocytes Relative: 3 % — ABNORMAL LOW (ref 12–46)
MCH: 30.5 pg (ref 26.0–34.0)
MCHC: 31.5 g/dL (ref 30.0–36.0)
MCV: 96.9 fL (ref 78.0–100.0)
MONO ABS: 0.8 10*3/uL (ref 0.1–1.0)
Monocytes Relative: 8 % (ref 3–12)
Neutro Abs: 9.2 10*3/uL — ABNORMAL HIGH (ref 1.7–7.7)
Neutrophils Relative %: 89 % — ABNORMAL HIGH (ref 43–77)
PLATELETS: 94 10*3/uL — AB (ref 150–400)
RBC: 2.59 MIL/uL — AB (ref 3.87–5.11)
RDW: 22.4 % — ABNORMAL HIGH (ref 11.5–15.5)
WBC MORPHOLOGY: INCREASED
WBC: 10.3 10*3/uL (ref 4.0–10.5)

## 2014-01-12 LAB — COMPREHENSIVE METABOLIC PANEL
ALBUMIN: 2.1 g/dL — AB (ref 3.5–5.2)
ALT: 21 U/L (ref 0–35)
ANION GAP: 14 (ref 5–15)
AST: 19 U/L (ref 0–37)
Alkaline Phosphatase: 222 U/L — ABNORMAL HIGH (ref 39–117)
BUN: 14 mg/dL (ref 6–23)
CO2: 22 mEq/L (ref 19–32)
CREATININE: 0.63 mg/dL (ref 0.50–1.10)
Calcium: 8.6 mg/dL (ref 8.4–10.5)
Chloride: 96 mEq/L (ref 96–112)
GFR calc Af Amer: >90 mL/min (ref >90)
GFR calc non Af Amer: >90 mL/min (ref >90)
Glucose, Bld: 87 mg/dL (ref 70–99)
Potassium: 3.7 mEq/L (ref 3.7–5.3)
Sodium: 132 mEq/L — ABNORMAL LOW (ref 137–147)
Total Bilirubin: 6.3 mg/dL — ABNORMAL HIGH (ref 0.3–1.2)
Total Protein: 5.5 g/dL — ABNORMAL LOW (ref 6.0–8.3)

## 2014-01-12 LAB — POC URINE PREG, ED: Preg Test, Ur: NEGATIVE

## 2014-01-12 LAB — URINALYSIS, ROUTINE W REFLEX MICROSCOPIC
Glucose, UA: NEGATIVE mg/dL
Hgb urine dipstick: NEGATIVE
KETONES UR: NEGATIVE mg/dL
Nitrite: NEGATIVE
PH: 6 (ref 5.0–8.0)
Protein, ur: NEGATIVE mg/dL
SPECIFIC GRAVITY, URINE: 1.024 (ref 1.005–1.030)
UROBILINOGEN UA: 1 mg/dL (ref 0.0–1.0)

## 2014-01-12 LAB — URINE MICROSCOPIC-ADD ON

## 2014-01-12 LAB — LIPASE, BLOOD: LIPASE: 41 U/L (ref 11–59)

## 2014-01-12 LAB — TROPONIN I

## 2014-01-12 LAB — I-STAT CG4 LACTIC ACID, ED: Lactic Acid, Venous: 4.29 mmol/L — ABNORMAL HIGH (ref 0.5–2.2)

## 2014-01-12 LAB — MRSA PCR SCREENING: MRSA by PCR: NEGATIVE

## 2014-01-12 LAB — AMMONIA: Ammonia: 40 umol/L (ref 11–60)

## 2014-01-12 LAB — MAGNESIUM: MAGNESIUM: 1.3 mg/dL — AB (ref 1.5–2.5)

## 2014-01-12 MED ORDER — SODIUM CHLORIDE 0.9 % IV BOLUS (SEPSIS)
500.0000 mL | Freq: Once | INTRAVENOUS | Status: AC
  Administered 2014-01-12: 500 mL via INTRAVENOUS

## 2014-01-12 MED ORDER — SODIUM CHLORIDE 0.9 % IV SOLN: INTRAVENOUS | Status: DC

## 2014-01-12 MED ORDER — PREDNISONE 5 MG PO TABS
5.0000 mg | ORAL_TABLET | Freq: Every day | ORAL | Status: DC
  Administered 2014-01-13 – 2014-01-16 (×4): 5 mg via ORAL
  Filled 2014-01-12 (×6): qty 1

## 2014-01-12 MED ORDER — ONDANSETRON HCL 4 MG PO TABS
4.0000 mg | ORAL_TABLET | Freq: Four times a day (QID) | ORAL | Status: DC | PRN
Start: 2014-01-12 — End: 2014-01-16

## 2014-01-12 MED ORDER — AZATHIOPRINE 50 MG PO TABS
100.0000 mg | ORAL_TABLET | Freq: Every day | ORAL | Status: DC
  Administered 2014-01-13 – 2014-01-16 (×4): 100 mg via ORAL
  Filled 2014-01-12 (×6): qty 2

## 2014-01-12 MED ORDER — PROPRANOLOL HCL ER 60 MG PO CP24
60.0000 mg | ORAL_CAPSULE | Freq: Every day | ORAL | Status: DC
  Administered 2014-01-13 – 2014-01-16 (×4): 60 mg via ORAL
  Filled 2014-01-12 (×5): qty 1

## 2014-01-12 MED ORDER — SODIUM CHLORIDE 0.9 % IV SOLN
500.0000 mg | Freq: Two times a day (BID) | INTRAVENOUS | Status: DC
  Administered 2014-01-13: 500 mg via INTRAVENOUS
  Filled 2014-01-12 (×2): qty 500

## 2014-01-12 MED ORDER — MORPHINE SULFATE 4 MG/ML IJ SOLN
4.0000 mg | Freq: Once | INTRAMUSCULAR | Status: AC
  Administered 2014-01-12: 4 mg via INTRAVENOUS
  Filled 2014-01-12: qty 1

## 2014-01-12 MED ORDER — LEVALBUTEROL HCL 0.63 MG/3ML IN NEBU
0.6300 mg | INHALATION_SOLUTION | Freq: Four times a day (QID) | RESPIRATORY_TRACT | Status: DC | PRN
Start: 2014-01-12 — End: 2014-01-16

## 2014-01-12 MED ORDER — LACTULOSE 10 GM/15ML PO SOLN
20.0000 g | Freq: Two times a day (BID) | ORAL | Status: DC | PRN
  Filled 2014-01-12: qty 30

## 2014-01-12 MED ORDER — ALUM & MAG HYDROXIDE-SIMETH 200-200-20 MG/5ML PO SUSP: 30.0000 mL | Freq: Four times a day (QID) | ORAL | Status: DC | PRN

## 2014-01-12 MED ORDER — ONDANSETRON HCL 4 MG/2ML IJ SOLN
4.0000 mg | Freq: Once | INTRAMUSCULAR | Status: AC
  Administered 2014-01-12: 4 mg via INTRAVENOUS
  Filled 2014-01-12: qty 2

## 2014-01-12 MED ORDER — OXYCODONE HCL 5 MG PO TABS
5.0000 mg | ORAL_TABLET | ORAL | Status: DC | PRN
  Administered 2014-01-12 – 2014-01-13 (×3): 5 mg via ORAL
  Filled 2014-01-12 (×3): qty 1

## 2014-01-12 MED ORDER — IOHEXOL 300 MG/ML  SOLN
80.0000 mL | Freq: Once | INTRAMUSCULAR | Status: AC | PRN
Start: 2014-01-12 — End: 2014-01-12
  Administered 2014-01-12: 80 mL via INTRAVENOUS

## 2014-01-12 MED ORDER — SODIUM CHLORIDE 0.9 % IV BOLUS (SEPSIS)
1000.0000 mL | Freq: Once | INTRAVENOUS | Status: AC
  Administered 2014-01-12: 1000 mL via INTRAVENOUS

## 2014-01-12 MED ORDER — IOHEXOL 300 MG/ML  SOLN
50.0000 mL | Freq: Once | INTRAMUSCULAR | Status: AC | PRN
  Administered 2014-01-12: 50 mL via ORAL

## 2014-01-12 MED ORDER — PIPERACILLIN-TAZOBACTAM 3.375 G IVPB
3.3750 g | Freq: Three times a day (TID) | INTRAVENOUS | Status: DC
  Administered 2014-01-13 – 2014-01-14 (×5): 3.375 g via INTRAVENOUS
  Filled 2014-01-12 (×4): qty 50

## 2014-01-12 MED ORDER — ONDANSETRON HCL 4 MG/2ML IJ SOLN
4.0000 mg | Freq: Four times a day (QID) | INTRAMUSCULAR | Status: DC | PRN
  Administered 2014-01-12 – 2014-01-16 (×4): 4 mg via INTRAVENOUS
  Filled 2014-01-12 (×4): qty 2

## 2014-01-12 MED ORDER — VANCOMYCIN HCL IN DEXTROSE 1-5 GM/200ML-% IV SOLN
1000.0000 mg | INTRAVENOUS | Status: AC
  Administered 2014-01-12: 1000 mg via INTRAVENOUS
  Filled 2014-01-12: qty 200

## 2014-01-12 MED ORDER — PIPERACILLIN-TAZOBACTAM 3.375 G IVPB 30 MIN
3.3750 g | INTRAVENOUS | Status: AC
  Filled 2014-01-12 (×2): qty 50

## 2014-01-12 MED ORDER — FENTANYL CITRATE 0.05 MG/ML IJ SOLN
25.0000 ug | Freq: Once | INTRAMUSCULAR | Status: AC
  Administered 2014-01-12: 25 ug via INTRAVENOUS
  Filled 2014-01-12: qty 2

## 2014-01-12 NOTE — ED Notes (Addendum)
Mother at bedside reports pt on list for liver transplant. Mother request attending to [facility].

## 2014-01-12 NOTE — ED Notes (Signed)
Mercedes PA aware of lactic acid at time of result. Per PA awaiting prior to further orders.

## 2014-01-12 NOTE — Progress Notes (Signed)
ANTIBIOTIC CONSULT NOTE - INITIAL  Pharmacy Consult for Vancomycin and Zosyn Indication: Intra-abdominal infection  No Known Allergies  Patient Measurements: Height: 4\' 9"  (144.8 cm) Weight: 97 lb (43.999 kg) IBW/kg (Calculated) : 38.6  Vital Signs: Temp: 98.5 F (36.9 C) (12/16 1209) Temp Source: Oral (12/16 1209) BP: 113/61 mmHg (12/16 1500) Pulse Rate: 118 (12/16 1500) Intake/Output from previous day:   Intake/Output from this shift:    Labs:  Recent Labs  01/12/14 1320  WBC 10.3  HGB 7.9*  PLT 94*  CREATININE 0.63   Estimated Creatinine Clearance: 66.6 mL/min (by C-G formula based on Cr of 0.63). No results for input(s): VANCOTROUGH, VANCOPEAK, VANCORANDOM, GENTTROUGH, GENTPEAK, GENTRANDOM, TOBRATROUGH, TOBRAPEAK, TOBRARND, AMIKACINPEAK, AMIKACINTROU, AMIKACIN in the last 72 hours.   Microbiology: No results found for this or any previous visit (from the past 720 hour(s)).  Medical History: Past Medical History  Diagnosis Date  . Liver disease   . Hepatitis   . Anemia   . Esophageal varices     Medications:  Scheduled:   Infusions:  . piperacillin-tazobactam 3.375 g (01/12/14 1546)  . vancomycin     PRN:   Assessment: [age] yo female with hepatitis awaiting liver transplant presents with severe abdominal cramping with n/v/d x 1 day. Pharmacy is consulted to dose vancomycin and zosyn for possible intra-abdominal infection.  Goal of Therapy:  Vancomycin trough level 15-20 mcg/ml  Zosyn dose appropriate for indication, renal function  Plan:  Zosyn 3.375gm IV q8h (4hr extended infusions) Vancomycin 1g IV x 1, then 500mg  IV q12h Check trough at steady state Follow up renal function & cultures, clinical course  Loralee PacasErin Jakory Matsuo, PharmD, BCPS Pager: [phone] 01/12/2014,3:45 PM

## 2014-01-12 NOTE — ED Notes (Addendum)
Per EMS pt complaint of severe abdominal cramping with n/v/d starting yesterday morning. Pt denies nausea at present time.

## 2014-01-12 NOTE — H&P (Addendum)
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 Patient was admitted for similar c/o on 11/15, with thoughts that her symptoms were due to worsening ascites. Diagnostic paracentesis at that time negative. Her diuretic doses were increased, with good effect and patient was discharged on 11/16.  Patient was again seen in the ED  at [facility] on 12/10  and improvement of symptoms discharged from the ED. She return to [facility] ER yesterday with a similar exacerbation but could not wait to be seen unless the ER. Because of her multiple episodes of diarrhea the patient has not been using her lactulose.   Denies any fevers, chills, chest pain, difficulty breathing. She denies any dysuria, vaginal discharge. She also denies any hematemesis of hematochezia.  Patient and mother report some improvement in her abdominal distention, and do not feel her ascites has worsened. Patient states pain is similar to her previous "episodes." Abd/pelvis CT performed 11/15 with diffuse bowel edema (unchanged from prior.) Repeat CT scan done in the ED today is pending   Upon exam, patient appears in some discomfort, with normal vital signs, afebrile. Heart sounds are normal, lungs CTA b/l. Patient abdomen with mild distention, and moderated diffuse tenderness to palpation. Worst in the suprapubic region and RUQ. Guaiac negative.   ED course  Upreg neg, U/A showing moderate bili, trace leuks, rare squamous and bacteria, 0-2WBC, hyaline crystals, doubt UTI. Lactic acid 4.29 which could  indicate infectious process or could be somewhat related to dehydration. CBC w/diff showing chronic baseline anemia H/H 7.9/25.1, no leukocytosis but increased bands with mild left shift. Plt count 94 which is slightly low compared to baseline. Sodium 132, alk phos 222 which is improved from prior results, bili 6.3 which is higher than baseline. Lipase WNL. EDP  discussed with Dr. Rayvon Char returning page (Tel # [phone]) for apparently recommended a CT scan and starting the patient on empiric antibiotics. She also recommended paracentesis to rule out SBP     Review of Systems  Constitutional: Negative for fever and chills.  HENT: Negative for ear discharge, ear pain, rhinorrhea, sinus pressure and sore throat.  Eyes: Negative for pain and visual disturbance.  Respiratory: Positive for cough. Negative for shortness of breath and wheezing.  Cardiovascular: Negative for chest pain.  Gastrointestinal: Positive for nausea, vomiting, abdominal pain and diarrhea. Negative for constipation, melena, hematochezia, flatus and hematemesis.  Genitourinary: Negative for dysuria, urgency, frequency, hematuria, flank pain, decreased urine volume, vaginal bleeding and vaginal discharge.  Musculoskeletal: Negative for myalgias, back pain and arthralgias.  Skin: Negative for color change.  Neurological: Positive for light-headedness and headaches. Negative for syncope, facial asymmetry, weakness and numbness.  Psychiatric/Behavioral: Negative for confusion.   10 Systems reviewed and are negative for acute change except as noted in the HPI.       Past Medical History  Diagnosis Date  . Liver disease   . Hepatitis   . Anemia   . Esophageal varices      Past Surgical History  Procedure Laterality Date  . Gastric banding port revision    . Tonsillectomy    .  Esophagogastroduodenoscopy  07/22/2011    Procedure: ESOPHAGOGASTRODUODENOSCOPY (EGD);  Surgeon: Wonda Horner, MD;  Location: [facility]  ENDOSCOPY;  Service: Endoscopy;  Laterality: N/A;      Social History:  reports that she has never smoked. She has never used smokeless tobacco. She reports that she does not drink alcohol or use illicit drugs.    No Known Allergies  History reviewed. No pertinent family history.   Prior to Admission medications   Medication Sig Start Date End Date Taking? Authorizing Provider  azaTHIOprine (IMURAN) 50 MG tablet Take 100 mg by mouth daily.    Yes Historical Provider, MD  ergocalciferol (VITAMIN D2) 50000 UNITS capsule Take 50,000 Units by mouth once a week.   Yes Historical Provider, MD  lactulose (CHRONULAC) 10 GM/15ML solution Take 20 g by mouth 2 (two) times daily as needed (constipation).    Yes Historical Provider, MD  omeprazole (PRILOSEC) 20 MG capsule Take 20 mg by mouth daily.   Yes Historical Provider, MD  predniSONE (DELTASONE) 5 MG tablet Take 5 mg by mouth daily.   Yes Historical Provider, MD  propranolol (INDERAL LA) 60 MG 24 hr capsule Take 60 mg by mouth daily.   Yes Historical Provider, MD     Physical Exam: Filed Vitals:   01/12/14 1600 01/12/14 1630 01/12/14 1725 01/12/14 1730  BP: 1'21/82 99/57 97/53 ' 130/91  Pulse: 111 111 119 112  Temp:      TempSrc:      Resp:   18   Height:      Weight:      SpO2: 100% 95% 97% 97%    Physical Exam  Constitutional: She is oriented to person, place, and time. She appears well-developed. Non-toxic appearance. She appears distressed.  Thin female, appearing distressed and in pain, tachycardic in the 110s, BP 108/73, speaking in full sentences and no hypoxia.  HENT:  Head: Normocephalic and atraumatic.  Nose: Nose normal.  Mouth/Throat: Oropharynx is clear and moist. Mucous membranes are dry.  Dry mucous membranes  Eyes: Conjunctivae and EOM are normal. Pupils are equal, round, and reactive to light. Right eye exhibits no discharge. Left eye exhibits no discharge. Scleral icterus is present.  PERRL, EOMI, scleral  icterus  Neck: Normal range of motion. Neck supple.  Cardiovascular: Regular rhythm, normal heart sounds and intact distal pulses. Tachycardia present. Exam reveals no gallop and no friction rub.  No murmur heard. Tachycardic, regular rhythm, no m/r/g, distal pulses intact  Pulmonary/Chest: Effort normal. No respiratory distress. She has no decreased breath sounds. She has no wheezes. She has rhonchi (diffuse). She has no rales.  Mild diffuse rhonchi which improve with coughing, SpO2 100% on RA, no increased WOB, intermittent coughing. No distress or retractions.  Abdominal: Normal appearance and bowel sounds are normal. She exhibits ascites. She exhibits no distension. There is hepatomegaly. There is generalized tenderness. There is no rigidity, no rebound, no guarding and no tenderness at McBurney's point.  Soft, mildly distended with ascites, +BS throughout although slightly hypoactive, diffuse TTP with no r/g/r, neg mcburney's, +hepatomegaly  Musculoskeletal: Normal range of motion.  MAE x4 Strength 5/5 in all extremities Sensation grossly intact in all extremities Gait not assessed due to patient discomfort  Neurological: She is alert and oriented to person, place, and time. She has normal strength. No cranial nerve deficit or sensory deficit.  Strength and sensation intact GCS 15, A&O x4 CN 2-12 grossly intact Unable to assess romberg/pronator drift/gait due to pt discomfort and unwillingness to perform  exam.  No focal deficits  Skin: Skin is warm, dry and intact. No rash noted.  Psychiatric: She has a normal mood and affect.  Nursing note and vitals reviewed.        Labs on Admission:    Basic Metabolic Panel:  Recent Labs Lab 01/12/14 1320  NA 132*  K 3.7  CL 96  CO2 22  GLUCOSE 87  BUN 14  CREATININE 0.63  CALCIUM 8.6   Liver Function Tests:  Recent Labs Lab 01/12/14 1320  AST 19  ALT 21  ALKPHOS 222*  BILITOT 6.3*  PROT 5.5*  ALBUMIN 2.1*     Recent Labs Lab 01/12/14 1320  LIPASE 41   No results for input(s): AMMONIA in the last 168 hours. CBC:  Recent Labs Lab 01/12/14 1320  WBC 10.3  NEUTROABS 9.2*  HGB 7.9*  HCT 25.1*  MCV 96.9  PLT 94*   Cardiac Enzymes: No results for input(s): CKTOTAL, CKMB, CKMBINDEX, TROPONINI in the last 168 hours.  BNP (last 3 results) No results for input(s): PROBNP in the last 8760 hours.    CBG: No results for input(s): GLUCAP in the last 168 hours.  Radiological Exams on Admission: Ct Abdomen Pelvis W Contrast  01/12/2014   CLINICAL DATA:  Epigastric pain, nausea/vomiting x1 week, autoimmune liver disease/cirrhosis  EXAM: CT ABDOMEN AND PELVIS WITH CONTRAST  TECHNIQUE: Multidetector CT imaging of the abdomen and pelvis was performed using the standard protocol following bolus administration of intravenous contrast.  CONTRAST:  38m OMNIPAQUE IOHEXOL 300 MG/ML  SOLN  COMPARISON:  01/26/2013  FINDINGS: Lower chest:  Lung bases are clear.  Hepatobiliary: Cirrhotic configuration of the liver. Calcified granuloma in the subcapsular left hepatic lobe (series 2/ image 18). No suspicious/enhancing hepatic lesions.  Gallbladder is unremarkable. No intrahepatic or extrahepatic ductal dilatation.  Pancreas: Within normal limits.  Spleen: Splenomegaly, measuring 15.6 cm in craniocaudal dimension.  Adrenals/Urinary Tract: Adrenal glands are unremarkable.  Kidneys are within normal limits.  No hydronephrosis.  Bladder is unremarkable.  Stomach/Bowel: Stomach is unremarkable.  No evidence of bowel obstruction.  Wall thickening involving the colon, much of which can probably be accounted for by mucosal edema related to ascites/hypoalbuminemia. However, there is more significant thickening involving the proximal transverse colon and ascending colon/cecum (for example, series 2/ image 42), and underlying infectious/ inflammatory colitis is not excluded.  Vascular/Lymphatic: No evidence of abdominal  aortic aneurysm.  Gastroesophageal varices. 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 Portal vein is patent.  Circumaortic left renal vein.  No suspicious abdominopelvic lymphadenopathy.  Reproductive: Uterus is unremarkable.  Bilateral ovaries are within normal limits.  Other: Large volume abdominopelvic ascites.  Musculoskeletal: Visualized osseous structures are within normal limits.  IMPRESSION: No evidence of bowel obstruction.  Colonic wall thickening, much of which likely reflects mucosal edema secondary to ascites/type of anemia. However, underlying infectious/inflammatory colitis involving the ascending/proximal transverse colon is not excluded.  Cirrhosis. Splenomegaly. Gastroesophageal varices with patent portal vein and. Large volume abdominopelvic ascites.   Electronically Signed   By: SJulian HyM.D.   On: 01/12/2014 18:21   Dg Abd Acute W/chest  01/12/2014   CLINICAL DATA:  Abdominal pain.  Nausea and vomiting  EXAM: ACUTE ABDOMEN SERIES (ABDOMEN 2 VIEW & CHEST 1 VIEW)  COMPARISON:  05/27/2013  FINDINGS: The lungs are clear without infiltrate or effusion.  Mildly dilated small bowel loops with air-fluid levels suggestive of small bowel obstruction. Colon is decompressed. Increased density the abdomen which may be due to fluid-filled bowel loops  or ascites.  No free air.  IMPRESSION: Small-bowel obstruction pattern. Consider CT for further evaluation.  Question ascites   Electronically Signed   By: Franchot Gallo M.D.   On: 01/12/2014 14:29    EKG: Independently reviewed.  Assessment/Plan Active Problems:   Abdominal pain, acute   Abdominal pain   Assessment and plan:    23 y.o. female with a history of primary sclerosing cholangitis, autoimmune hepatitis, cirrhosis with esophageal varices s/p banding x 2 (03/2013 and 06/2013) being admitted for diffuse abdominal pain nausea vomiting diarrhea.    Abdominal pain No evidence of GI bleeding CT abdomen pelvis pending Rule out C.  difficile Continue empiric antibiotics Ultrasound-guided paracentesis to rule out SBP   Cirrhosis On Lasix at home which will be placed on hold Patient dehydrated and will be started on gentle IV hydration Patient is on SBP prophylaxis She takes Levaquin on a regular basis She's also on Imuran ,, ursodiol Patient not taking her lactulose because of diarrhea  Cachexia Nutrition consult will be obtained  Esophageal varices  Cont PPI Hg dropped since 9/15 Will check stool guiac  Code Status:   full Family Communication: bedside Disposition Plan: admit   Time spent: 70 mins   [facility] Pager [phone]  If 7PM-7AM, please contact night-coverage [email or web address] Password [facility] 01/12/2014, 6:25 PM

## 2014-01-12 NOTE — ED Notes (Signed)
Pt to have CT Abdomen Pelvis W Contrast first before transfer to Floor Unit.

## 2014-01-12 NOTE — ED Notes (Signed)
Contact number given to [place] PA to contact Dr. Piedad Climesarling.

## 2014-01-12 NOTE — ED Provider Notes (Signed)
CSN: [id number]     Arrival date & time 01/12/14  1205 History   First MD Initiated Contact with Patient 01/12/14 1237     Chief Complaint  Patient presents with  . Abdominal Pain     (Consider location/radiation/quality/duration/timing/severity/associated sxs/prior Treatment) HPI Comments: Melanie Conley is a 23 y.o. 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 Rayvon Char at [facility]), anemia, and esophageal varices, who presents to the ED with complaints of ongoing worsening generalized abd pain and n/v/d x6 days. Pt states she was seen in the ER last week at [facility], given fluids/antiemetics/morphine which resolved her pain, but then it returned yesterday. She again went to the ER at [facility] but had to wait a significant amount of time therefore she left before being seen. She states the pain is 10/10 sharp generalized nonradiating pain, constant, worse with palpation and movement, and improved with laying down/rest. She has not tried any medications for her pain. She has had NBNB emesis, one episode in the last 24hrs, and watery diarrhea ~10 episodes daily which has been ongoing all week. Additionally she reports having a headache (10/10 R temporal, throbbing, nonradiating, worse with coughing, and improved with rest/sleep, gradual onset). She feels dehydrated, and states she gets lightheaded with standing. She also states she's had a cough with yellowish sputum x1 week. Denies URI symptoms, fevers, chills, vertigo, vision changes, tinnitus, ear/eye pain, CP, SOB, wheezing, hematuria, dysuria, obstipation, constipation, melena, hematochezia, coffee-ground emesis, hematemesis, myalgias, arthralgias, syncope, focal weakness, paresthesias, numbness, focal neuro deficits, vaginal discharge/bleeding, back pain, or rashes. She states she is compliant on all medications, including Levaquin 749m weekly. She has not used her lactulose this week due to her diarrhea. States in the past  she's needed paracentesis for her ascites. LMP yesterday. No recent travel or sick contacts, no suspicious food intake.  Patient is a 23y.o. female presenting with abdominal pain. The history is provided by the patient and a parent. No language interpreter was used.  Abdominal Pain Pain location:  Generalized Pain quality: sharp   Pain radiates to:  Does not radiate Pain severity:  Severe (10/10) Onset quality:  Gradual Duration:  6 days Timing:  Constant Progression:  Worsening Chronicity:  Recurrent Context: recent illness   Relieved by:  Lying down (rest) Worsened by:  Movement and palpation Ineffective treatments:  None tried Associated symptoms: cough, diarrhea, nausea and vomiting   Associated symptoms: no belching, no chest pain, no chills, no constipation, no dysuria, no fever, no flatus, no hematemesis, no hematochezia, no hematuria, no melena, no shortness of breath, no sore throat, no vaginal bleeding and no vaginal discharge   Risk factors: recent hospitalization     Past Medical History  Diagnosis Date  . Liver disease   . Hepatitis   . Anemia    Past Surgical History  Procedure Laterality Date  . Gastric banding port revision    . Tonsillectomy    . Esophagogastroduodenoscopy  07/22/2011    Procedure: ESOPHAGOGASTRODUODENOSCOPY (EGD);  Surgeon: SWonda Horner MD;  [facility]  Service: Endoscopy;  Laterality: N/A;   No family history on file. History  Substance Use Topics  . Smoking status: Current Some Day Smoker -- 1.00 packs/day for 2 years    Types: Cigarettes  . Smokeless tobacco: Not on file  . Alcohol Use: No   OB History    No data available     Review of Systems  Constitutional: Negative for fever  and chills.  HENT: Negative for ear discharge, ear pain, rhinorrhea, sinus pressure and sore throat.   Eyes: Negative for pain and visual disturbance.  Respiratory: Positive for cough. Negative for shortness of breath and wheezing.    Cardiovascular: Negative for chest pain.  Gastrointestinal: Positive for nausea, vomiting, abdominal pain and diarrhea. Negative for constipation, melena, hematochezia, flatus and hematemesis.  Genitourinary: Negative for dysuria, urgency, frequency, hematuria, flank pain, decreased urine volume, vaginal bleeding and vaginal discharge.  Musculoskeletal: Negative for myalgias, back pain and arthralgias.  Skin: Negative for color change.  Neurological: Positive for light-headedness and headaches. Negative for syncope, facial asymmetry, weakness and numbness.  Psychiatric/Behavioral: Negative for confusion.   10 Systems reviewed and are negative for acute change except as noted in the HPI.    Allergies  Review of patient's allergies indicates no known allergies.  Home Medications   Prior to Admission medications   Medication Sig Start Date End Date Taking? Authorizing Provider  azaTHIOprine (IMURAN) 50 MG tablet Take 100 mg by mouth daily.    Yes Historical Provider, MD  ergocalciferol (VITAMIN D2) 50000 UNITS capsule Take 50,000 Units by mouth once a week.   Yes Historical Provider, MD  lactulose (CHRONULAC) 10 GM/15ML solution Take 20 g by mouth 2 (two) times daily as needed (constipation).    Yes Historical Provider, MD  omeprazole (PRILOSEC) 20 MG capsule Take 20 mg by mouth daily.   Yes Historical Provider, MD  predniSONE (DELTASONE) 5 MG tablet Take 5 mg by mouth daily.   Yes Historical Provider, MD  propranolol (INDERAL LA) 60 MG 24 hr capsule Take 60 mg by mouth daily.   Yes Historical Provider, MD   BP 108/73 mmHg  Pulse 113  Temp(Src) 98.5 F (36.9 C) (Oral)  Resp 22  SpO2 98% Physical Exam  Constitutional: She is oriented to person, place, and time. She appears well-developed.  Non-toxic appearance. She appears distressed.  Thin female, appearing distressed and in pain, tachycardic in the 110s, BP 108/73, speaking in full sentences and no hypoxia.   HENT:  Head:  Normocephalic and atraumatic.  Nose: Nose normal.  Mouth/Throat: Oropharynx is clear and moist. Mucous membranes are dry.  Dry mucous membranes  Eyes: Conjunctivae and EOM are normal. Pupils are equal, round, and reactive to light. Right eye exhibits no discharge. Left eye exhibits no discharge. Scleral icterus is present.  PERRL, EOMI, scleral icterus  Neck: Normal range of motion. Neck supple.  Cardiovascular: Regular rhythm, normal heart sounds and intact distal pulses.  Tachycardia present.  Exam reveals no gallop and no friction rub.   No murmur heard. Tachycardic, regular rhythm, no m/r/g, distal pulses intact  Pulmonary/Chest: Effort normal. No respiratory distress. She has no decreased breath sounds. She has no wheezes. She has rhonchi (diffuse). She has no rales.  Mild diffuse rhonchi which improve with coughing, SpO2 100% on RA, no increased WOB, intermittent coughing. No distress or retractions.  Abdominal: Normal appearance and bowel sounds are normal. She exhibits ascites. She exhibits no distension. There is hepatomegaly. There is generalized tenderness. There is no rigidity, no rebound, no guarding and no tenderness at McBurney's point.  Soft, mildly distended with ascites, +BS throughout although slightly hypoactive, diffuse TTP with no r/g/r, neg mcburney's, +hepatomegaly  Musculoskeletal: Normal range of motion.  MAE x4 Strength 5/5 in all extremities Sensation grossly intact in all extremities Gait not assessed due to patient discomfort  Neurological: She is alert and oriented to person, place, and time. She has  normal strength. No cranial nerve deficit or sensory deficit.  Strength and sensation intact GCS 15, A&O x4 CN 2-12 grossly intact Unable to assess romberg/pronator drift/gait due to pt discomfort and unwillingness to perform exam.  No focal deficits  Skin: Skin is warm, dry and intact. No rash noted.  Psychiatric: She has a normal mood and affect.  Nursing note  and vitals reviewed.   ED Course  Procedures (including critical care time)  Labs Review Labs Reviewed  CBC WITH DIFFERENTIAL - Abnormal; Notable for the following:    RBC 2.59 (*)    Hemoglobin 7.9 (*)    HCT 25.1 (*)    RDW 22.4 (*)    Platelets 94 (*)    Neutrophils Relative % 89 (*)    Lymphocytes Relative 3 (*)    Neutro Abs 9.2 (*)    Lymphs Abs 0.3 (*)    All other components within normal limits  COMPREHENSIVE METABOLIC PANEL - Abnormal; Notable for the following:    Sodium 132 (*)    Total Protein 5.5 (*)    Albumin 2.1 (*)    Alkaline Phosphatase 222 (*)    Total Bilirubin 6.3 (*)    All other components within normal limits  URINALYSIS, ROUTINE W REFLEX MICROSCOPIC - Abnormal; Notable for the following:    Color, Urine ORANGE (*)    APPearance CLOUDY (*)    Bilirubin Urine MODERATE (*)    Leukocytes, UA TRACE (*)    All other components within normal limits  URINE MICROSCOPIC-ADD ON - Abnormal; Notable for the following:    Casts HYALINE CASTS (*)    All other components within normal limits  I-STAT CG4 LACTIC ACID, ED - Abnormal; Notable for the following:    Lactic Acid, Venous 4.29 (*)    All other components within normal limits  CULTURE, BLOOD (ROUTINE X 2)  CULTURE, BLOOD (ROUTINE X 2)  LIPASE, BLOOD  POC URINE PREG, ED    Imaging Review Dg Abd Acute W/chest  01/12/2014   CLINICAL DATA:  Abdominal pain.  Nausea and vomiting  EXAM: ACUTE ABDOMEN SERIES (ABDOMEN 2 VIEW & CHEST 1 VIEW)  COMPARISON:  05/27/2013  FINDINGS: The lungs are clear without infiltrate or effusion.  Mildly dilated small bowel loops with air-fluid levels suggestive of small bowel obstruction. Colon is decompressed. Increased density the abdomen which may be due to fluid-filled bowel loops or ascites.  No free air.  IMPRESSION: Small-bowel obstruction pattern. Consider CT for further evaluation.  Question ascites   Electronically Signed   By: Franchot Gallo M.D.   On: 01/12/2014 14:29      EKG Interpretation None    EKG @ [facility] 01/11/14:   ECG 12 Lead - Final result (01/11/2014 8:00 PM EST)  Component Value Range  EKG Systolic BP  mmHg  EKG Diastolic BP  mmHg  EKG Ventricular Rate 118 BPM  EKG Atrial Rate 118 BPM  EKG P-R Interval 136 ms  EKG QRS Duration 72 ms  EKG Q-T Interval 332 ms  EKG QTC Calculation 465 ms  EKG Calculated P Axis 48 degrees  EKG Calculated R Axis 3 degrees  EKG Calculated T Axis 30 degrees   ECG 12 Lead - Final result (01/11/2014 8:00 PM EST)  Narrative  SINUS TACHYCARDIA  NONSPECIFIC T WAVE ABNORMALITY  ABNORMAL ECG  WHEN COMPARED WITH ECG OF 12-Aug-2011 08:04,  VENT. RATE HAS INCREASED BY57 BPM  Confirmed by SIMPSON MD, ROSS ([id number]) on 01/12/2014 8:37:55 AM  [facility] 01/11/14: Lab Results Pregnancy Qualitative, Urine - Final result (01/11/2014 7:43 PM EST) Pregnancy Qualitative, Urine - Final result (01/11/2014 7:43 PM EST)  Component Value Range  Preg Test, Ur NEGATIVE NEGATIVE   Pregnancy Qualitative, Urine - Final result (01/11/2014 7:43 PM EST)  Specimen  Other    Urinalysis with Culture Reflex - Final result (01/11/2014 7:43 PM EST) Urinalysis with Culture Reflex - Final result (01/11/2014 7:43 PM EST)  Component Value Range  Color, UA Dark-Brown   Clarity, UA Hazy   Specific Gravity, UA 1.019 1.003-1.030  pH, UA 6.5 5.0-9.0  Leukocyte Esterase, UA NEGATIVE NEGATIVE  Nitrite, UA NEGATIVE NEGATIVE  Protein, UA +1 NEGATIVE  Glucose, UA NEGATIVE NEGATIVE  Ketones, UA NEGATIVE NEGATIVE  Urobilinogen, UA 8 TRACE TO 1 mg/dL  Bilirubin, UA +3 NEGATIVE  Blood, UA +3 NEGATIVE  WBC, UA 16 (H) 0-5 /[HPF]  RBC, UA 2 0-4 /[HPF]  Squam Epithel, UA 56 /[HPF]  Mucus, UA MANY    Urinalysis with Culture Reflex - Final result (01/11/2014 7:43 PM EST)  Specimen  Other    EGFR(MDRD) Non-African American - Final result (01/11/2014 7:22 PM EST) EGFR(MDRD) Non-African American - Final result (01/11/2014 7:22  PM EST)  Component Value Range  EGFR Non-African American >=60 >=60 mL/min/1.73_m2   EGFR(MDRD) Non-African American - Final result (01/11/2014 7:22 PM EST)  Specimen  Other    EGFR(MDRD) African American - Final result (01/11/2014 7:22 PM EST) EGFR(MDRD) African American - Final result (01/11/2014 7:22 PM EST)  Component Value Range  EGFR African American >=60 >=60 mL/min/1.73_m2   EGFR(MDRD) African American - Final result (01/11/2014 7:22 PM EST)  Specimen  Other    Bun/Creatinine Ratio - Final result (01/11/2014 7:22 PM EST) Bun/Creatinine Ratio - Final result (01/11/2014 7:22 PM EST)  Component Value Range  BUN/Creatinine Ratio 16 UNDEFINED   Bun/Creatinine Ratio - Final result (01/11/2014 7:22 PM EST)  Specimen  Other    Anion Gap - Final result (01/11/2014 7:22 PM EST) Anion Gap - Final result (01/11/2014 7:22 PM EST)  Component Value Range  Anion Gap 12 9-15 mmol/L   Anion Gap - Final result (01/11/2014 7:22 PM EST)  Specimen  Other    Lipase Level - Final result (01/11/2014 7:22 PM EST) Lipase Level - Final result (01/11/2014 7:22 PM EST)  Component Value Range  Lipase 93 44-232 U/L   Lipase Level - Final result (01/11/2014 7:22 PM EST)  Specimen  Other    Comprehensive Metabolic Panel - Final result (01/11/2014 7:22 PM EST) Comprehensive Metabolic Panel - Final result (01/11/2014 7:22 PM EST)  Component Value Range  Sodium 136 135-145 mmol/L  Potassium 3.6 3.5-5.0 mmol/L  Chloride 100 98-107 mmol/L  CO2 24 22-30 mmol/L  BUN 11 7-21 mg/dL  Creatinine 0.69 0.60-1.00 mg/dL  Glucose 95 65-179 mg/dL  Calcium 8.1 (L) 8.5-10.2 mg/dL  Albumin 2.6 (L) 3.5-5.0 g/dL  Total Protein 5.8 (L) 6.6-8.0 g/dL  Total Bilirubin 5.5 (H) 0.0-1.2 mg/dL  AST 38 14-38 U/L  ALT 42 15-48 U/L  Alkaline Phosphatase 276 (H) 38-126 U/L   Comprehensive Metabolic Panel - Final result (01/11/2014 7:22 PM EST)  Specimen  Other    CBC and differential - Final result  (01/11/2014 7:22 PM EST) CBC and differential - Final result (01/11/2014 7:22 PM EST)  Component Value Range  Nucleated RBC 2 /100 WBCs  WBC 5.2 4.5-11.0 10*9/L  RBC 2.94 (L) 4.00-5.20 10*12/L  Hemoglobin 9.1 (L) 12.0-16.0 g/dL  Hematocrit 28.8 (L) 36.0-46.0 %  MCV 98 80-100 fL  MCH 31 26-34 pg  MCHC 32 31-37 g/dL  RDW 23.4 (H) 12.0-15.0 %  MPV 10.8 (H) 7.0-10.0 fL  Platelet Count 137 (L) 150-440 10*9/L  Platelet Comments SEE COMMENTComment: GIANT PLATELETS PRESENT.   Neutrophils Absolute 4.5 2.0-7.5 10*9/L  Lymphocytes Absolute 0.2 (L) 1.5-5.0 10*9/L  Monocytes Absolute 0.2 0.2-0.8 10*9/L  Eosinophils Absolute 0.0 0.0-0.4 10*9/L  Basophils Absolute 0.1 0.0-0.1 10*9/L  Large Unstained Cells 2 0-4 %  Differential Comments SMEAR REVIEWED   Neutrophil Left Shift 3+   WBC Comment SEE COMMENTComment:  DOHLE BODIES PRESENT. TOXIC GRANULATION PRESENT. TOXIC VACUOLATION PRESENT.     Anisocytosis 3+   Microcytosis 1+   Macrocytosis 3+   Variable Hemoglobin Concentration 1+   Hypochromasia 3+   Polychromasia OCC   Poikilocytosis MODERATE   Ovalocytosis MODERATE   RBC Comments SEE COMMENTComment: IRREGULARLY CONTRACTED RBCS PRESENT.     MDM   Final diagnoses:  Abdominal pain, acute  Non-intractable vomiting with nausea, vomiting of unspecified type  Ascites of liver  Left shift without diagnosis of specific infection  Lactic acid increased  Hyperbilirubinemia  Autoimmune hepatitis    23 y.o. female here with abd pain and n/v/d x6 days. Pt has chronic hepatitis and seen at [facility]. Seen last night, labs obtained, but pt left before being seen. Last CT 12/13/13 which showed mild bowel edema but no obstruction, some ascites. She has hx of needing paracentesis. Pt mildly tachycardic with BPs low but stable, afebrile. No red flag s/sx for headache, likely related to dehydration. Will start fluids, obtain labs, and get AAS. Doubt need for nebs, pt moving air well with no  desats, mild rhonchi throughout c/w bronchial-type infection. Pt afebrile. CXR with AAS will help eval for any possible PNA. No CP or SOB. Will give antiemetics and pain meds then reassess after labs. Likely admission, will likely call Dr. Drue Novel after results return. Biggest concern at this point is for SBP vs SBO vs acute decompensation in liver status.  2:50 PM Upreg neg, U/A showing moderate bili, trace leuks, rare squamous and bacteria, 0-2WBC, hyaline crystals, doubt UTI. Lactic acid 4.29 which could indicate infectious process or could be somewhat related to dehydration. CBC w/diff showing chronic baseline anemia H/H 7.9/25.1, no leukocytosis but increased bands with mild left shift. Plt count 94 which is slightly low compared to baseline. Sodium 132, alk phos 222 which is improved from prior results, bili 6.3 which is higher than baseline. Lipase WNL. AAS showing no pulmonary issues and questionable small bowel obstruction pattern but could be ascites, recommends CT. Will call Dr. Drue Novel to discuss further imaging vs transfer vs ongoing management/care. Pt with improvement of pain and HA as well as nausea, VSS, will give more fluids and small amount of fentanyl to not drop BP any further.   3:15 PM Dr. Rayvon Char returning page (Tel # [phone]). States she would avoid transferring at this time given that it'd be ER to ER and may delay care. Would start vanc/zosyn for concern for intraabd infectious process. Would get CT imaging and would avoid NGT until definitive dx of SBO is obtained. She believes pt would likely benefit/need a paracentesis, and reiterates that admission here would be in the best interest of pt, if transfer is needed later it would not delay care as it would now. States she'll advise the on-call provider that pt may be transferred later if that is necessary. Will order cultures and start vanc/zosyn. Will obtain CT. Will  proceed with admission here. Family updated and has no  questions at this time.  4:19 PM Dr. Allyson Sabal returning page from [facility], would like GI to weigh in regarding whether admission here is appropriate before she will agree to admit pt. Will consult GI. Pt with stable BPs, would like more morphine stating that fentanyl didn't help, will give 51m morphine. Nausea continues to be well controlled.   5:12 PM PNevin Bloodgood([facility] PA-C) returning page, discussed case with Dr. JArdis Hughswho agrees with proceeding with admission today here, will consult while pt is in the hospital. Will call Dr. AAllyson Sabalfor admission now. CT still not performed. Pt stable.  5:25 PM Dr. AAllyson Sabalreturning page. Will admit pt to stepdown. Admission orders placed, family updated, please see Dr. ALedell Peoplesnote for further documentation of care. Pt stable at this time.   BP 113/61 mmHg  Pulse 118  Temp(Src) 98.5 F (36.9 C) (Oral)  Resp 24  Ht _0  (1.448 m)  Wt 97 lb (43.999 kg)  BMI 20.98 kg/m2  SpO2 95%  LMP 01/11/2014  Meds ordered this encounter  Medications  . sodium chloride 0.9 % bolus 500 mL    Sig:   . ondansetron (ZOFRAN) injection 4 mg    Sig:   . morphine 4 MG/ML injection 4 mg    Sig:   . sodium chloride 0.9 % bolus 1,000 mL    Sig:   . fentaNYL (SUBLIMAZE) injection 25 mcg    Sig:   . iohexol (OMNIPAQUE) 300 MG/ML solution 50 mL    Sig:   . piperacillin-tazobactam (ZOSYN) IVPB 3.375 g    Sig:     Order Specific Question:  Antibiotic Indication:    Answer:  Intra-abdominal Infection  . vancomycin (VANCOCIN) IVPB 1000 mg/200 mL premix    Sig:     Order Specific Question:  Indication:    Answer:  Other Indication (list below)    Order Specific Question:  Other Indication:    Answer:  intra-abdominal infection  . vancomycin (VANCOCIN) 500 mg in sodium chloride 0.9 % 100 mL IVPB    Sig:     Order Specific Question:  Indication:    Answer:  Other Indication (list below)    Order Specific Question:  Other Indication:    Answer:  intra-abdominal infection   . piperacillin-tazobactam (ZOSYN) IVPB 3.375 g    Sig:     Order Specific Question:  Antibiotic Indication:    Answer:  Intra-abdominal Infection  . morphine 4 MG/ML injection 4 mg    Sig:      MCorine Shelter PA-C 01/12/14 1726  NJasper Riling PAlvino Chapel MD 01/16/14 [phone]

## 2014-01-12 NOTE — ED Notes (Signed)
Pt transported to DG at present time. 

## 2014-01-12 NOTE — ED Notes (Signed)
Bed: WA01 Expected date:  Expected time:  Means of arrival:  Comments: EMS-abdominal pain 

## 2014-01-13 ENCOUNTER — Inpatient Hospital Stay (HOSPITAL_COMMUNITY): Payer: BC Managed Care – PPO

## 2014-01-13 DIAGNOSIS — E43 Unspecified severe protein-calorie malnutrition: Secondary | ICD-10-CM | POA: Insufficient documentation

## 2014-01-13 LAB — COMPREHENSIVE METABOLIC PANEL
ALBUMIN: 1.9 g/dL — AB (ref 3.5–5.2)
ALK PHOS: 188 U/L — AB (ref 39–117)
ALT: 17 U/L (ref 0–35)
AST: 16 U/L (ref 0–37)
Anion gap: 11 (ref 5–15)
BUN: 11 mg/dL (ref 6–23)
CALCIUM: 8.1 mg/dL — AB (ref 8.4–10.5)
CO2: 24 mEq/L (ref 19–32)
Chloride: 94 mEq/L — ABNORMAL LOW (ref 96–112)
Creatinine, Ser: 0.69 mg/dL (ref 0.50–1.10)
GFR calc non Af Amer: >90 mL/min (ref >90)
Glucose, Bld: 108 mg/dL — ABNORMAL HIGH (ref 70–99)
POTASSIUM: 3.5 meq/L — AB (ref 3.7–5.3)
SODIUM: 129 meq/L — AB (ref 137–147)
TOTAL PROTEIN: 5.3 g/dL — AB (ref 6.0–8.3)
Total Bilirubin: 5.2 mg/dL — ABNORMAL HIGH (ref 0.3–1.2)

## 2014-01-13 LAB — TROPONIN I

## 2014-01-13 LAB — GLUCOSE, SEROUS FLUID: Glucose, Fluid: 72 mg/dL

## 2014-01-13 LAB — PROTEIN, BODY FLUID: Total protein, fluid: 0.6 g/dL

## 2014-01-13 LAB — ALBUMIN, FLUID (OTHER): Albumin, Fluid: 0.3 g/dL

## 2014-01-13 LAB — BODY FLUID CELL COUNT WITH DIFFERENTIAL
LYMPHS FL: 4 %
Monocyte-Macrophage-Serous Fluid: 13 % — ABNORMAL LOW (ref 50–90)
NEUTROPHIL FLUID: 83 % — AB (ref 0–25)
Total Nucleated Cell Count, Fluid: 7866 cu mm — ABNORMAL HIGH (ref 0–1000)

## 2014-01-13 LAB — CBC
HEMATOCRIT: 23.7 % — AB (ref 36.0–46.0)
HEMOGLOBIN: 7.6 g/dL — AB (ref 12.0–15.0)
MCH: 31 pg (ref 26.0–34.0)
MCHC: 32.1 g/dL (ref 30.0–36.0)
MCV: 96.7 fL (ref 78.0–100.0)
Platelets: 107 10*3/uL — ABNORMAL LOW (ref 150–400)
RBC: 2.45 MIL/uL — AB (ref 3.87–5.11)
RDW: 22.3 % — ABNORMAL HIGH (ref 11.5–15.5)
WBC: 10.5 10*3/uL (ref 4.0–10.5)

## 2014-01-13 LAB — LACTATE DEHYDROGENASE, PLEURAL OR PERITONEAL FLUID: LD, Fluid: 105 U/L — ABNORMAL HIGH (ref 3–23)

## 2014-01-13 LAB — CLOSTRIDIUM DIFFICILE BY PCR: CDIFFPCR: NEGATIVE

## 2014-01-13 LAB — TSH: TSH: 0.4 u[IU]/mL (ref 0.350–4.500)

## 2014-01-13 MED ORDER — OXYCODONE HCL 5 MG PO TABS
5.0000 mg | ORAL_TABLET | ORAL | Status: DC | PRN
  Administered 2014-01-13: 5 mg via ORAL
  Administered 2014-01-13 – 2014-01-14 (×2): 10 mg via ORAL
  Administered 2014-01-15: 5 mg via ORAL
  Administered 2014-01-15: 10 mg via ORAL
  Administered 2014-01-15: 5 mg via ORAL
  Administered 2014-01-16: 10 mg via ORAL
  Filled 2014-01-13 (×2): qty 1
  Filled 2014-01-13 (×2): qty 2
  Filled 2014-01-13: qty 1
  Filled 2014-01-13 (×3): qty 2

## 2014-01-13 MED ORDER — ALBUMIN HUMAN 25 % IV SOLN
12.5000 g | Freq: Once | INTRAVENOUS | Status: AC
  Administered 2014-01-13: 12.5 g via INTRAVENOUS
  Filled 2014-01-13: qty 50

## 2014-01-13 MED ORDER — POTASSIUM CHLORIDE IN NACL 40-0.9 MEQ/L-% IV SOLN
INTRAVENOUS | Status: DC
  Administered 2014-01-13 – 2014-01-16 (×4): 50 mL/h via INTRAVENOUS
  Filled 2014-01-13 (×8): qty 1000

## 2014-01-13 MED ORDER — VANCOMYCIN HCL 500 MG IV SOLR
500.0000 mg | Freq: Three times a day (TID) | INTRAVENOUS | Status: DC
  Administered 2014-01-13 – 2014-01-14 (×3): 500 mg via INTRAVENOUS
  Filled 2014-01-13 (×3): qty 500

## 2014-01-13 MED ORDER — PANTOPRAZOLE SODIUM 40 MG PO TBEC
40.0000 mg | DELAYED_RELEASE_TABLET | Freq: Two times a day (BID) | ORAL | Status: DC
  Administered 2014-01-13 – 2014-01-16 (×7): 40 mg via ORAL
  Filled 2014-01-13 (×8): qty 1

## 2014-01-13 NOTE — Progress Notes (Signed)
Utilization review completed.  

## 2014-01-13 NOTE — Procedures (Signed)
US guided diagnostic/therapeutic paracentesis performed yielding 2.7 liters turbid, yellow fluid. A portion of the fluid was sent to the lab for preordered studies. No immediate complications. The pt will receive IV albumin infusion postprocedure.

## 2014-01-13 NOTE — Progress Notes (Signed)
INITIAL NUTRITION ASSESSMENT  DOCUMENTATION CODES Per approved criteria  -Severe malnutrition in the context of acute illness or injury  Pt meets criteria for severe MALNUTRITION in the context of acute on chronic illness as evidenced by PO intake <50% for > 5 days, moderate muscle wasting and fat loss.   INTERVENTION: -Recommend vanilla Ensure Complete po BID, each supplement provides 350 kcal and 13 grams of protein as diet advancement tolerated -Recommend multivitamin -Encouraged intake of 5-6 small meals or snacks daily to assist with weight maintenace -RD to continue to monitor  NUTRITION DIAGNOSIS: Increased nutrient needs (protein/kcal) related to chronic disease as evidenced by cirrhosis/hepatitis.   Goal: Pt to meet >/= 90% of their estimated nutrition needs    Monitor:  Diet order, total protein/energy intake, labs, weights, GI profile  Reason for Assessment: Consult  23 y.o. 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 Has had ongoing nausea vomiting and abd pain with both liquids and solids.  -Prior to past two weeks, pt eating well. Reported drinking Ensure supplements occasionally, and eating small meals and snacks throughout the day -Pt has hx of unintentional wt loss, usual body weight around 130 lb in 2013. Lost 30  Lbs over two year span (25% body weight, non-severe for time frame) d/t chronic medical conditions -Has been working to gain weight; able to gain 5lb in over past 6 months. Encouraged continue compliance with supplements and frequent snacks -NPO for CT of abd; will order Ensure BID w/diet  advancement -C.diff results pending; pt stopped taking lactulose d/t ongoing loose stools -Moderate muscle wasting and fat loss noted  Height: Ht Readings from Last 1 Encounters:  01/12/14 4\' 9"  (1.448 m)    Weight: Wt Readings from Last 1 Encounters:  01/13/14 103 lb 9.9 oz (47 kg)    Ideal Body Weight:92.5 lb  % Ideal Body Weight: 111%  Wt Readings from Last 10 Encounters:  01/13/14 103 lb 9.9 oz (47 kg)  05/28/13 97 lb 4.8 oz (44.135 kg)  10/19/11 120 lb (54.432 kg)  07/23/11 123 lb 3.8 oz (55.9 kg)  04/13/11 129 lb (58.514 kg)    Usual Body Weight: ~130 lb in 2013, ~100 lb recently past 6 months  % Usual Body Weight: 100-80% body weight  BMI:  Body mass index is 22.42 kg/(m^2).  Estimated Nutritional Needs: Kcal: 1400-1600 Protein: 65-75 gram Fluid: >/=1400 ml daily  Skin: WDL  Diet Order:    EDUCATION NEEDS: -Education needs addressed   Intake/Output Summary (Last 24 hours) at 01/13/14 1102 Last data filed at 01/13/14 0906  Gross per 24 hour  Intake 1472.5 ml  Output      0 ml  Net 1472.5 ml    Last BM: PTA, abd distended   Labs:   Recent Labs Lab 01/12/14 1320 01/13/14 0506  NA 132* 129*  K 3.7 3.5*  CL 96 94*  CO2 22 24  BUN 14 11  CREATININE 0.63 0.69  CALCIUM 8.6 8.1*  MG 1.3*  --   GLUCOSE 87 108*    CBG (last 3)  No results for input(s): GLUCAP in the last 72 hours.  Scheduled Meds: . azaTHIOprine  100  mg Oral Daily  . pantoprazole  40 mg Oral BID  . piperacillin-tazobactam  3.375 g Intravenous STAT  . piperacillin-tazobactam (ZOSYN)  IV  3.375 g Intravenous Q8H  . predniSONE  5 mg Oral QAC breakfast  . propranolol ER  60 mg Oral Daily  . vancomycin  500 mg Intravenous Q8H    Continuous Infusions: . 0.9 % NaCl with KCl 40 mEq / L 50 mL/hr (01/13/14 47820905)    Past Medical History  Diagnosis Date  . Liver disease   . Hepatitis   . Anemia   . Esophageal varices     Past Surgical History  Procedure Laterality  Date  . Gastric banding port revision    . Tonsillectomy    . Esophagogastroduodenoscopy  07/22/2011    Procedure: ESOPHAGOGASTRODUODENOSCOPY (EGD);  Surgeon: Graylin ShiverSalem F Ganem, MD;  [facility];  Service: Endoscopy;  Laterality: N/A;    Lloyd HugerSarah F Skyelar Halliday MS RD LDN Clinical Dietitian Pager:[phone]

## 2014-01-13 NOTE — Progress Notes (Addendum)
ANTIBIOTIC CONSULT NOTE - FOLLOW-UP  Pharmacy Consult for Vancomycin and Zosyn Indication: Intra-abdominal infection, concern for SBP  No Known Allergies  Patient Measurements: Height: 4\' 9"  (144.8 cm) Weight: 103 lb 9.9 oz (47 kg) IBW/kg (Calculated) : 38.6  Vital Signs: Temp: 100.1 F (37.8 C) (12/17 0800) Temp Source: Oral (12/17 0800) BP: 101/64 mmHg (12/17 0800) Pulse Rate: 108 (12/17 0800) Intake/Output from previous day: 12/16 0701 - 12/17 0700 In: 1350 [I.V.:1000; IV Piggyback:350] Out: -  Intake/Output from this shift: Total I/O In: 122.5 [P.O.:60; I.V.:50; IV Piggyback:12.5] Out: -   Labs:  Recent Labs  01/12/14 1320 01/13/14 0506  WBC 10.3 10.5  HGB 7.9* 7.6*  PLT 94* 107*  CREATININE 0.63 0.69   Microbiology: Recent Results (from the past 720 hour(s))  Culture, blood (routine x 2)     Status: None (Preliminary result)   Collection Time: 01/12/14  3:44 PM  Result Value Ref Range Status   Specimen Description BLOOD RIGHT HAND  Final   Special Requests BOTTLES DRAWN AEROBIC AND ANAEROBIC 4ML  Final   Culture  Setup Time   Final    01/12/2014 22:45 Performed at [facility]    Culture   Final           BLOOD CULTURE RECEIVED NO GROWTH TO DATE CULTURE WILL BE HELD FOR 5 DAYS BEFORE ISSUING A FINAL NEGATIVE REPORT Performed at [facility]    Report Status PENDING  Incomplete  Culture, blood (routine x 2)     Status: None (Preliminary result)   Collection Time: 01/12/14  3:51 PM  Result Value Ref Range Status   Specimen Description BLOOD RIGHT HAND  Final   Special Requests BOTTLES DRAWN AEROBIC AND ANAEROBIC 5ML  Final   Culture  Setup Time   Final    01/12/2014 22:42 Performed at [facility]    Culture   Final           BLOOD CULTURE RECEIVED NO GROWTH TO DATE CULTURE WILL BE HELD FOR 5 DAYS BEFORE ISSUING A FINAL NEGATIVE REPORT Performed at [facility]    Report Status PENDING  Incomplete  MRSA PCR  Screening     Status: None   Collection Time: 01/12/14  6:41 PM  Result Value Ref Range Status   MRSA by PCR NEGATIVE NEGATIVE Final    Comment:        The GeneXpert MRSA Assay (FDA approved for NASAL specimens only), is one component of a comprehensive MRSA colonization surveillance program. It is not intended to diagnose MRSA infection nor to guide or monitor treatment for MRSA infections.   Clostridium Difficile by PCR     Status: None   Collection Time: 01/13/14  2:17 AM  Result Value Ref Range Status   C difficile by pcr NEGATIVE NEGATIVE Final    Comment: Performed at [facility]    Medical History: Past Medical History  Diagnosis Date  . Liver disease   . Hepatitis   . Anemia   . Esophageal varices     Medications:  Scheduled:  . azaTHIOprine  100 mg Oral Daily  . pantoprazole  40 mg Oral BID  . piperacillin-tazobactam  3.375 g Intravenous STAT  . piperacillin-tazobactam (ZOSYN)  IV  3.375 g Intravenous Q8H  . predniSONE  5 mg Oral QAC breakfast  . propranolol ER  60 mg Oral Daily  . vancomycin  500 mg Intravenous Q8H   Infusions:  . 0.9 % NaCl with  KCl 40 mEq / L 50 mL/hr (01/13/14 [id number])   Assessment: 23 y/o F with hepatitis awaiting liver transplant presents with severe abdominal cramping with n/v/d x 1 day. Pharmacy is consulted to dose Vancomycin and Zosyn for possible intra-abdominal infection/SBP.  12/16 >> Vancomycin  >> 12/16 >> Zosyn  >>    Tmax: 100.1 WBCs: 10.5K Renal: SCr 0.69, CrCl ~ 91 mL/min CG  12/16 blood x 2: NGTD 12/16 urine: sent  12/16 MRSA PCR: negative 12/17 C.diff PCR: negative  Goal of Therapy:  Vancomycin trough level 15-20 mcg/ml  Appropriate antibiotic dosing for renal function and indication Eradication of infection  Plan:  Change Vancomycin to 500 mg IV q8h per renal function, age, indication. Check Vancomycin trough level at steady state. Continue Zosyn 3.375g IV q8h (infusing each dose over 4  hours). Follow up renal function, cultures, clinical course.   Greer PickerelJigna Michell Kader, PharmD, BCPS Pager: [phone] 01/13/2014 10:12 AM

## 2014-01-13 NOTE — Progress Notes (Addendum)
[facility] PROGRESS NOTE  [name]:[id number] DOB: 1990-03-18 DOA: 01/12/2014 PCP: Rayvon Char, MD  Assessment/Plan: Active Problems:   Abdominal pain, acute   Abdominal pain    Abdominal pain No evidence of GI bleeding CT abdomen pelvis shows changes consistent with cirrhosis, but no bowel obstruction, C. difficile pending Patient continues to have diarrhea Continue empiric antibiotics Ultrasound-guided paracentesis to rule out SBP daily as recommended by the patient's outpatient gastroenterologist  Hyponatremia Sodium down to 129 This could be the setting of dehydration secondary to diarrhea vs Lasix Hold Lasix for now  ' Cirrhosis On Lasix at home which will be placed on hold Patient dehydrated and will be started on gentle IV hydration Patient is on SBP prophylaxis , compliant with levofloxacin and home Also currently on Imuran ,, ursodiol Patient not taking her lactulose because of diarrhea  Cachexia Nutrition consult will be obtained  Esophageal varices  Cont PPI Hg dropped since 9/15 Will check stool guiac  Code Status: full Family Communication: family updated about patient's clinical progress Disposition Plan:  As above    Brief narrative: HPI:  23 year old female with 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 Patient was admitted for similar c/o on 11/15, with thoughts that her symptoms were due to worsening ascites. Diagnostic paracentesis at that time negative. Her diuretic doses were increased, with good effect and patient was discharged on 11/16. Patient was again seen in the ED at [facility] on 12/10 and improvement of symptoms discharged from the ED. She return to  [facility] ER yesterday with a similar exacerbation but  could not wait to be seen unless the ER. Because of her multiple episodes of diarrhea the patient has not been using her lactulose.  Denies any fevers, chills, chest pain, difficulty breathing. She denies any dysuria, vaginal discharge. She also denies any hematemesis of hematochezia. Patient and mother report some improvement in her abdominal distention, and do not feel her ascites has worsened. Patient states pain is similar to her previous "episodes." Abd/pelvis CT performed 11/15 with diffuse bowel edema (unchanged from prior.) Repeat CT scan done in the ED today is pending   Upon exam, patient appears in some discomfort, with normal vital signs, afebrile. Heart sounds are normal, lungs CTA b/l. Patient abdomen with mild distention, and moderated diffuse tenderness to palpation. Worst in the suprapubic region and RUQ. Guaiac negative.   ED course  Upreg neg, U/A showing moderate bili, trace leuks, rare squamous and bacteria, 0-2WBC, hyaline crystals, doubt UTI. Lactic acid 4.29 which could indicate infectious process or could be somewhat related to dehydration. CBC w/diff showing chronic baseline anemia H/H 7.9/25.1, no leukocytosis but increased bands with mild left shift. Plt count 94 which is slightly low compared to baseline. Sodium 132, alk phos 222 which is improved from prior results, bili 6.3 which is higher than baseline. Lipase WNL. EDP discussed with Dr. Rayvon Char returning page (Tel # [phone]) for apparently recommended a CT scan and starting the patient on empiric antibiotics. She also recommended paracentesis to rule out SBP  Consultants:  Gastroenterology  Procedures:  None  Antibiotics: Vanc and zosyn  HPI/Subjective: Still having diarrhea and abdominal  pain  Objective: Filed Vitals:   01/13/14 0100 01/13/14 0357 01/13/14 0627 01/13/14 0800  BP: 99/59  102/50   Pulse:  112  116   Temp:  99.4 F (37.4 C)  100.1 F (37.8 C)  TempSrc:  Oral  Oral  Resp:  20  20   Height:      Weight:  47 kg (103 lb 9.9 oz)    SpO2: 90%  97%     Intake/Output Summary (Last 24 hours) at 01/13/14 4098 Last data filed at 01/13/14 1191  Gross per 24 hour  Intake   1350 ml  Output      0 ml  Net   1350 ml    Exam:  General: alert & oriented x 3 In NAD  Cardiovascular: RRR, nl S1 s2  Respiratory: Decreased breath sounds at the bases, scattered rhonchi, no crackles  Abdomen: soft +BS NT/ND, no masses palpable  Extremities: No cyanosis and no edema      Data Reviewed: Basic Metabolic Panel:  Recent Labs Lab 01/12/14 1320 01/13/14 0506  NA 132* 129*  K 3.7 3.5*  CL 96 94*  CO2 22 24  GLUCOSE 87 108*  BUN 14 11  CREATININE 0.63 0.69  CALCIUM 8.6 8.1*  MG 1.3*  --     Liver Function Tests:  Recent Labs Lab 01/12/14 1320 01/13/14 0506  AST 19 16  ALT 21 17  ALKPHOS 222* 188*  BILITOT 6.3* 5.2*  PROT 5.5* 5.3*  ALBUMIN 2.1* 1.9*    Recent Labs Lab 01/12/14 1320  LIPASE 41    Recent Labs Lab 01/12/14 1930  AMMONIA 40    CBC:  Recent Labs Lab 01/12/14 1320 01/13/14 0506  WBC 10.3 10.5  NEUTROABS 9.2*  --   HGB 7.9* 7.6*  HCT 25.1* 23.7*  MCV 96.9 96.7  PLT 94* 107*    Cardiac Enzymes:  Recent Labs Lab 01/12/14 2200 01/13/14 0506  TROPONINI <0.30 <0.30   BNP (last 3 results) No results for input(s): PROBNP in the last 8760 hours.   CBG: No results for input(s): GLUCAP in the last 168 hours.  Recent Results (from the past 240 hour(s))  Culture, blood (routine x 2)     Status: None (Preliminary result)   Collection Time: 01/12/14  3:44 PM  Result Value Ref Range Status   Specimen Description BLOOD RIGHT HAND  Final   Special Requests BOTTLES DRAWN AEROBIC AND ANAEROBIC 4ML  Final   Culture  Setup Time   Final    01/12/2014 22:45 Performed at [facility]    Culture   Final           BLOOD CULTURE RECEIVED NO GROWTH TO DATE CULTURE WILL BE HELD FOR 5 DAYS BEFORE ISSUING A FINAL NEGATIVE  REPORT Performed at [facility]    Report Status PENDING  Incomplete  Culture, blood (routine x 2)     Status: None (Preliminary result)   Collection Time: 01/12/14  3:51 PM  Result Value Ref Range Status   Specimen Description BLOOD RIGHT HAND  Final   Special Requests BOTTLES DRAWN AEROBIC AND ANAEROBIC 5ML  Final   Culture  Setup Time   Final    01/12/2014 22:42 Performed at [facility]    Culture   Final           BLOOD CULTURE RECEIVED NO GROWTH TO DATE CULTURE WILL BE HELD FOR 5 DAYS BEFORE ISSUING A FINAL NEGATIVE REPORT Performed at [facility]    Report Status PENDING  Incomplete  MRSA PCR Screening     Status: None   Collection Time:  01/12/14  6:41 PM  Result Value Ref Range Status   MRSA by PCR NEGATIVE NEGATIVE Final    Comment:        The GeneXpert MRSA Assay (FDA approved for NASAL specimens only), is one component of a comprehensive MRSA colonization surveillance program. It is not intended to diagnose MRSA infection nor to guide or monitor treatment for MRSA infections.      Studies: Ct Abdomen Pelvis W Contrast  01/12/2014   CLINICAL DATA:  Epigastric pain, nausea/vomiting x1 week, autoimmune liver disease/cirrhosis  EXAM: CT ABDOMEN AND PELVIS WITH CONTRAST  TECHNIQUE: Multidetector CT imaging of the abdomen and pelvis was performed using the standard protocol following bolus administration of intravenous contrast.  CONTRAST:  48m OMNIPAQUE IOHEXOL 300 MG/ML  SOLN  COMPARISON:  01/26/2013  FINDINGS: Lower chest:  Lung bases are clear.  Hepatobiliary: Cirrhotic configuration of the liver. Calcified granuloma in the subcapsular left hepatic lobe (series 2/ image 18). No suspicious/enhancing hepatic lesions.  Gallbladder is unremarkable. No intrahepatic or extrahepatic ductal dilatation.  Pancreas: Within normal limits.  Spleen: Splenomegaly, measuring 15.6 cm in craniocaudal dimension.  Adrenals/Urinary Tract: Adrenal glands are  unremarkable.  Kidneys are within normal limits.  No hydronephrosis.  Bladder is unremarkable.  Stomach/Bowel: Stomach is unremarkable.  No evidence of bowel obstruction.  Wall thickening involving the colon, much of which can probably be accounted for by mucosal edema related to ascites/hypoalbuminemia. However, there is more significant thickening involving the proximal transverse colon and ascending colon/cecum (for example, series 2/ image 42), and underlying infectious/ inflammatory colitis is not excluded.  Vascular/Lymphatic: No evidence of abdominal aortic aneurysm.  Gastroesophageal varices. 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 Portal vein is patent.  Circumaortic left renal vein.  No suspicious abdominopelvic lymphadenopathy.  Reproductive: Uterus is unremarkable.  Bilateral ovaries are within normal limits.  Other: Large volume abdominopelvic ascites.  Musculoskeletal: Visualized osseous structures are within normal limits.  IMPRESSION: No evidence of bowel obstruction.  Colonic wall thickening, much of which likely reflects mucosal edema secondary to ascites/type of anemia. However, underlying infectious/inflammatory colitis involving the ascending/proximal transverse colon is not excluded.  Cirrhosis. Splenomegaly. Gastroesophageal varices with patent portal vein and. Large volume abdominopelvic ascites.   Electronically Signed   By: SJulian HyM.D.   On: 01/12/2014 18:21   Dg Abd Acute W/chest  01/12/2014   CLINICAL DATA:  Abdominal pain.  Nausea and vomiting  EXAM: ACUTE ABDOMEN SERIES (ABDOMEN 2 VIEW & CHEST 1 VIEW)  COMPARISON:  05/27/2013  FINDINGS: The lungs are clear without infiltrate or effusion.  Mildly dilated small bowel loops with air-fluid levels suggestive of small bowel obstruction. Colon is decompressed. Increased density the abdomen which may be due to fluid-filled bowel loops or ascites.  No free air.  IMPRESSION: Small-bowel obstruction pattern. Consider CT for further evaluation.   Question ascites   Electronically Signed   By: CFranchot GalloM.D.   On: 01/12/2014 14:29    Scheduled Meds: . azaTHIOprine  100 mg Oral Daily  . piperacillin-tazobactam  3.375 g Intravenous STAT  . piperacillin-tazobactam (ZOSYN)  IV  3.375 g Intravenous Q8H  . predniSONE  5 mg Oral QAC breakfast  . propranolol ER  60 mg Oral Daily  . vancomycin  500 mg Intravenous Q12H   Continuous Infusions: . 0.9 % NaCl with KCl 40 mEq / L 50 mL/hr (01/13/14 0905)    Active Problems:   Abdominal pain, acute   Abdominal pain     Time spent: 40 minutes  [facility]  [facility] Pager [phone]. If 7PM-7AM, please contact night-coverage at [email or web address], password [facility] 01/13/2014, 9:07 AM  LOS: 1 day

## 2014-01-13 NOTE — Consult Note (Signed)
Consultation  Referring Provider: [facility]     Primary Care Physician:  [name],[name], MD Primary Gastroenterologist:   [facility]      Reason for Consultation:  Nausea, vomiting,diarrhea. History of AIH.            HPI:   Melanie Conley is a 23 y.o. female with a history of autoimmune hepatitis with PSC overlap syndrome [facility]. She is s/p multiple EGDs with esophageal banding. [facility] for hematemesis. EGD by [facility] revealed a GE junction ulcer and a couple of post-bulbar erosions. [facility]. 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 Her appointment with Hepatology in October was cancelled, she sees them again early January.  Patient [facility] ED yesterday with nausea, vomiting, diarrhea and persistent abdominal pain. [facility].  Labs were okay. Hepatology cleared her for discharge. Of note, [facility] for abdominal pain.  No acute findings on imaging (see below). Patient describes loose stools with some associated incontinence. She has 3 loose BMs a day. Frequency not unusual, just liquid consistency. She takes Lactulose but only when constipated.   Patient poor historian but basically gives a one week history of loose stool, nausea /vomiting and worsening abdominal pain. Patient believes she is on chronic antibiotics but doesn't know name or why she takes them. No antibiotics on home med list. She describes chills and hot sensation.    [facility] studies   12/13/13 Abdominopelvic CT scan with IV only: -- Cirrhosis with signs of portal hypertension including splenomegaly, large volume ascites, and upper abdominal varices. Findings overall stable when compared to abdominal MRI of 09/06/13. -- Diffuse small bowel wall thickening likely secondary to chronic liver disease, and grossly unchanged from prior examinations. No focal areas of  hypoenhancement.  12/13/13 Ultrasound: Heterogeneous, nodular liver, likely representing cirrhosis. Patent hepatic vasculature with appropriate flow direction. Sluggish MPV velocity. Please note MRI is more sensitive in detection of focal hepatic lesions in the setting of cirrhosis. -- 6 mm echogenic focus in the left lobe of the liver, unchanged.  --Thickened gallbladder wall, partially decompressed.  --Large volume ascites and splenomegaly, likely related to portal venous hypertension, similar to prior.  __________________________________________________________________   09/06/13 MRI with MRCP: -- Cirrhotic liver with stable signs of portal hypertension including large volume ascites, upper abdominal varices and splenomegaly. No evidence of HCC. 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   07/23/13 EGD Findings: Grade II varices were found in the lower third of the esophagus. They  were medium in largest diameter. Two bands were successfully placed with  complete eradication, resulting in deflation of varices. There was no  bleeding at the end of the procedure. Mild portal hypertensive gastropathy was found in the gastric body. The examined duodenum was normal.  Impression: - Grade II esophageal varices. Completely eradicated.  Banded. - Portal hypertensive gastropathy. - Normal examined duodenum. - No specimens collected. Recommendation: - Continue present medications including propranolol  (inderal) and omeprazole for the next 2 weeks. - Repeat the upper endoscopy in 3 months for surveillance.   Past Medical History  Diagnosis Date  . Liver disease   . Hepatitis   . Anemia   . Esophageal varices     Past Surgical History  Procedure Laterality Date  . Gastric banding port revision    . Tonsillectomy    .  Esophagogastroduodenoscopy  07/22/2011    Procedure: ESOPHAGOGASTRODUODENOSCOPY (EGD);  Surgeon: Graylin ShiverSalem F Ganem, MD;  [facility]OS[facility];   Service: Endoscopy;  Laterality: N/A;    History reviewed. No pertinent family history.   History  Substance Use Topics  . Smoking status: Never Smoker   . Smokeless tobacco: Never Used  . Alcohol Use: No    Prior to Admission medications   Medication Sig Start Date End Date Taking? Authorizing Provider  azaTHIOprine (IMURAN) 50 MG tablet Take 100 mg by mouth daily.    Yes Historical Provider, MD  ergocalciferol (VITAMIN D2) 50000 UNITS capsule Take 50,000 Units by mouth once a week.   Yes Historical Provider, MD  lactulose (CHRONULAC) 10 GM/15ML solution Take 20 g by mouth 2 (two) times daily as needed (constipation).    Yes Historical Provider, MD  omeprazole (PRILOSEC) 20 MG capsule Take 20 mg by mouth daily.   Yes Historical Provider, MD  predniSONE (DELTASONE) 5 MG tablet Take 5 mg by mouth daily.   Yes Historical Provider, MD  propranolol (INDERAL LA) 60 MG 24 hr capsule Take 60 mg by mouth daily.   Yes Historical Provider, MD    Current Facility-Administered Medications  Medication Dose Route Frequency Provider Last Rate Last Dose  . 0.9 % NaCl with KCl 40 mEq / L  infusion   Intravenous Continuous Richarda OverlieNayana Abrol, MD 50 mL/hr at 01/13/14 0905 50 mL/hr at 01/13/14 0905  . alum & mag hydroxide-simeth (MAALOX/MYLANTA) 200-200-20 MG/5ML suspension 30 mL  30 mL Oral Q6H PRN Richarda OverlieNayana Abrol, MD      . azaTHIOprine (IMURAN) tablet 100 mg  100 mg Oral Daily Richarda OverlieNayana Abrol, MD      . lactulose (CHRONULAC) 10 GM/15ML solution 20 g  20 g Oral BID PRN Richarda OverlieNayana Abrol, MD      . levalbuterol (XOPENEX) nebulizer solution 0.63 mg  0.63 mg Nebulization Q6H PRN Richarda OverlieNayana Abrol, MD      . ondansetron (ZOFRAN) tablet 4 mg  4 mg Oral Q6H PRN Richarda OverlieNayana Abrol, MD       Or  . ondansetron (ZOFRAN) injection 4 mg  4 mg Intravenous Q6H PRN Richarda OverlieNayana Abrol, MD   4 mg at 01/12/14 1857  . oxyCODONE (Oxy IR/ROXICODONE) immediate release tablet 5 mg  5 mg Oral Q4H PRN Richarda OverlieNayana Abrol, MD   5 mg at 01/13/14 0210  . pantoprazole  (PROTONIX) EC tablet 40 mg  40 mg Oral BID Richarda OverlieNayana Abrol, MD      . piperacillin-tazobactam (ZOSYN) IVPB 3.375 g  3.375 g Intravenous STAT Rollene Farerin R Williamson, [facility]   Stopped at 01/12/14 1546  . piperacillin-tazobactam (ZOSYN) IVPB 3.375 g  3.375 g Intravenous Q8H Rollene Farerin R Williamson, RPH 12.5 mL/hr at 01/13/14 0906 3.375 g at 01/13/14 0906  . predniSONE (DELTASONE) tablet 5 mg  5 mg Oral QAC breakfast Richarda OverlieNayana Abrol, MD   5 mg at 01/13/14 0906  . propranolol ER (INDERAL LA) 24 hr capsule 60 mg  60 mg Oral Daily Richarda OverlieNayana Abrol, MD      . vancomycin (VANCOCIN) 500 mg in sodium chloride 0.9 % 100 mL IVPB  500 mg Intravenous Q12H Rollene Farerin R Williamson, RPH   500 mg at 01/13/14 [id number]    Allergies as of 01/12/2014  . (No Known Allergies)     Review of Systems:    All systems reviewed and negative except where noted in HPI.   Physical Exam:  Vital signs in last 24 hours: Temp:  [98.5 F (36.9 C)-100.1 F (  37.8 C)] 100.1 F (37.8 C) (12/17 0800) Pulse Rate:  [99-119] 108 (12/17 0800) Resp:  [14-24] 14 (12/17 0800) BP: (97-130)/(50-91) 101/64 mmHg (12/17 0800) SpO2:  [90 %-100 %] 100 % (12/17 0800) Weight:  [97 lb (43.999 kg)-103 lb 9.9 oz (47 kg)] 103 lb 9.9 oz (47 kg) (12/17 0357) Last BM Date: 01/13/14 General:   black female in NAD Head:  Normocephalic and atraumatic. Eyes:   mildly icteric. Ears:  Normal auditory acuity. Neck:  Supple; no masses felt Lungs: Respirations even and unlabored. Lungs clear to auscultation bilaterally.    Heart:  Regular rate and rhythm Abdomen:  Soft, mildly distended, nontender. Normal bowel sounds.  Msk:  Symmetrical without gross deformities.  Extremities:  Without edema. Neurologic:  Alert and  oriented x4;  grossly normal neurologically. Skin:  Intact without significant lesions or rashes. Cervical Nodes:  No significant cervical adenopathy. Psych:  Alert and cooperative. Normal affect.  LAB RESULTS:  Recent Labs  01/12/14 1320 01/13/14 0506  WBC 10.3  10.5  HGB 7.9* 7.6*  HCT 25.1* 23.7*  PLT 94* 107*   BMET  Recent Labs  01/12/14 1320 01/13/14 0506  NA 132* 129*  K 3.7 3.5*  CL 96 94*  CO2 22 24  GLUCOSE 87 108*  BUN 14 11  CREATININE 0.63 0.69  CALCIUM 8.6 8.1*   LFT  Recent Labs  01/13/14 0506  PROT 5.3*  ALBUMIN 1.9*  AST 16  ALT 17  ALKPHOS 188*  BILITOT 5.2*    STUDIES: Ct Abdomen Pelvis W Contrast  01/12/2014   CLINICAL DATA:  Epigastric pain, nausea/vomiting x1 week, autoimmune liver disease/cirrhosis  EXAM: CT ABDOMEN AND PELVIS WITH CONTRAST  TECHNIQUE: Multidetector CT imaging of the abdomen and pelvis was performed using the standard protocol following bolus administration of intravenous contrast.  CONTRAST:  80mL OMNIPAQUE IOHEXOL 300 MG/ML  SOLN  COMPARISON:  01/26/2013  FINDINGS: Lower chest:  Lung bases are clear.  Hepatobiliary: Cirrhotic configuration of the liver. Calcified granuloma in the subcapsular left hepatic lobe (series 2/ image 18). No suspicious/enhancing hepatic lesions.  Gallbladder is unremarkable. No intrahepatic or extrahepatic ductal dilatation.  Pancreas: Within normal limits.  Spleen: Splenomegaly, measuring 15.6 cm in craniocaudal dimension.  Adrenals/Urinary Tract: Adrenal glands are unremarkable.  Kidneys are within normal limits.  No hydronephrosis.  Bladder is unremarkable.  Stomach/Bowel: Stomach is unremarkable.  No evidence of bowel obstruction.  Wall thickening involving the colon, much of which can probably be accounted for by mucosal edema related to ascites/hypoalbuminemia. However, there is more significant thickening involving the proximal transverse colon and ascending colon/cecum (for example, series 2/ image 42), and underlying infectious/ inflammatory colitis is not excluded.  Vascular/Lymphatic: No evidence of abdominal aortic aneurysm.  Gastroesophageal varices. 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 Portal vein is patent.  Circumaortic left renal vein.  No suspicious  abdominopelvic lymphadenopathy.  Reproductive: Uterus is unremarkable.  Bilateral ovaries are within normal limits.  Other: Large volume abdominopelvic ascites.  Musculoskeletal: Visualized osseous structures are within normal limits.  IMPRESSION: No evidence of bowel obstruction.  Colonic wall thickening, much of which likely reflects mucosal edema secondary to ascites/type of anemia. However, underlying infectious/inflammatory colitis involving the ascending/proximal transverse colon is not excluded.  Cirrhosis. Splenomegaly. Gastroesophageal varices with patent portal vein and. Large volume abdominopelvic ascites.   Electronically Signed   By: Charline Bills M.D.   On: 01/12/2014 18:21   Dg Abd Acute W/chest  01/12/2014   CLINICAL DATA:  Abdominal pain.  Nausea and  vomiting  EXAM: ACUTE ABDOMEN SERIES (ABDOMEN 2 VIEW & CHEST 1 VIEW)  COMPARISON:  05/27/2013  FINDINGS: The lungs are clear without infiltrate or effusion.  Mildly dilated small bowel loops with air-fluid levels suggestive of small bowel obstruction. Colon is decompressed. Increased density the abdomen which may be due to fluid-filled bowel loops or ascites.  No free air.  IMPRESSION: Small-bowel obstruction pattern. Consider CT for further evaluation.  Question ascites   Electronically Signed   By: Marlan Palau M.D.   On: 01/12/2014 14:29    PREVIOUS ENDOSCOPIES:            see HPI   Impression / Plan:   64. 23 year old female with autoimmune hepatitis with PSC overlap followed by Dr. Piedad Climes at [facility]. She has a history of varices / ascites / encephalopathy.  Apparently she is on transplant list. She is on imuran, urso, and prednisone. States she takes an antibiotics every day but unsure of details???. Her bilirubin is 5.2, above baseline of 3-4. No asterixis, mildly coagulopathic.   2. Nausea, vomiting, loose stool and periumbilical abdominal pain. Unremarkable workup of abdominal pain during November admission to [facility] and also recent  [facility] ED visit. Doesn't seem like nausea /vomiting and diarrhea were so much of an issue until one week ago. Abdominal series yesterday suggests SBO pattern but CTscan negative for obstruction though does show colon wall thickening possibly related to ascites vrs infectious. C-diff is negative.  WBC normal but she does have low grade temp.   Ultrasound guided para already ordered, will make sure she gets cell count.  Patient immunocompromised will check complete stool pathogen panel.    3. Hyponatremia, ? Secondary to diuretics and /or loose stool. Lasix is now on hold. Will recheck am BMET.   Thanks   LOS: 1 day   Willette Cluster  01/13/2014, 9:30 AM    ________________________________________________________________________  [facility] MD note:  I reviewed the data and agree with the assessment and plan described above.  She was not in her room, was getting her paracentesis but I spoke with her parents.  I agree with the above note. Will follow along.   Rob Bunting, MD [facility] Pager [phone]

## 2014-01-14 DIAGNOSIS — K754 Autoimmune hepatitis: Secondary | ICD-10-CM | POA: Insufficient documentation

## 2014-01-14 DIAGNOSIS — K652 Spontaneous bacterial peritonitis: Principal | ICD-10-CM

## 2014-01-14 LAB — URINE CULTURE
COLONY COUNT: NO GROWTH
CULTURE: NO GROWTH

## 2014-01-14 LAB — COMPREHENSIVE METABOLIC PANEL
ALT: 13 U/L (ref 0–35)
AST: 16 U/L (ref 0–37)
Albumin: 1.9 g/dL — ABNORMAL LOW (ref 3.5–5.2)
Alkaline Phosphatase: 167 U/L — ABNORMAL HIGH (ref 39–117)
Anion gap: 9 (ref 5–15)
BUN: 9 mg/dL (ref 6–23)
CO2: 26 mEq/L (ref 19–32)
CREATININE: 0.6 mg/dL (ref 0.50–1.10)
Calcium: 7.9 mg/dL — ABNORMAL LOW (ref 8.4–10.5)
Chloride: 97 mEq/L (ref 96–112)
GFR calc non Af Amer: >90 mL/min (ref >90)
Glucose, Bld: 106 mg/dL — ABNORMAL HIGH (ref 70–99)
Potassium: 3.4 mEq/L — ABNORMAL LOW (ref 3.7–5.3)
SODIUM: 132 meq/L — AB (ref 137–147)
TOTAL PROTEIN: 5.2 g/dL — AB (ref 6.0–8.3)
Total Bilirubin: 3.8 mg/dL — ABNORMAL HIGH (ref 0.3–1.2)

## 2014-01-14 LAB — CBC
HCT: 22.3 % — ABNORMAL LOW (ref 36.0–46.0)
HEMOGLOBIN: 7.3 g/dL — AB (ref 12.0–15.0)
MCH: 31.3 pg (ref 26.0–34.0)
MCHC: 32.7 g/dL (ref 30.0–36.0)
MCV: 95.7 fL (ref 78.0–100.0)
Platelets: 114 10*3/uL — ABNORMAL LOW (ref 150–400)
RBC: 2.33 MIL/uL — AB (ref 3.87–5.11)
RDW: 21.7 % — ABNORMAL HIGH (ref 11.5–15.5)
WBC: 11.1 10*3/uL — AB (ref 4.0–10.5)

## 2014-01-14 MED ORDER — ALBUMIN HUMAN 25 % IV SOLN
75.0000 g | Freq: Once | INTRAVENOUS | Status: AC
  Administered 2014-01-16: 75 g via INTRAVENOUS
  Filled 2014-01-14 (×2): qty 300

## 2014-01-14 MED ORDER — ALBUMIN HUMAN 25 % IV SOLN
75.0000 g | Freq: Once | INTRAVENOUS | Status: AC
  Administered 2014-01-14: 75 g via INTRAVENOUS
  Filled 2014-01-14: qty 300

## 2014-01-14 MED ORDER — DEXTROSE 5 % IV SOLN
1.0000 g | INTRAVENOUS | Status: DC
  Administered 2014-01-14 – 2014-01-16 (×3): 1 g via INTRAVENOUS
  Filled 2014-01-14 (×3): qty 10

## 2014-01-14 NOTE — Progress Notes (Addendum)
[facility] PROGRESS NOTE  Melanie Conley WGN:[id number] DOB: 1990/03/02 DOA: 01/12/2014 PCP: Melanie Char, MD  Assessment/Plan: Active Problems:   Abdominal pain, acute   Abdominal pain   Protein-calorie malnutrition, severe   SBP (spontaneous bacterial peritonitis)   Autoimmune hepatitis      Abdominal pain secondary to SBP No evidence of GI bleeding CT abdomen pelvis shows changes consistent with cirrhosis, but no bowel obstruction, C. difficile pending Patient continues to have diarrhea Status post paracentesis 12/17 Diagnosed with spontaneous bacterial peritonitis. She was already taking weekly Levaquin at home for SBP prophylaxis. Fluid culture pending. Patient wants to go home and even threatening to leave Melanie Conley. Melanie Conley  called her  Hepatologist, Dr. Drue Conley, this am. She advised IV Rocephin + albumin for 3 days (I will order) followed by additional 7 days of Ceftin or Augmentin (unless culture redirects [place]). appreciate GI input  HyponatremiaImproving from 129-132  This could be the setting of dehydration secondary to diarrhea vs Lasix Hold Lasix for now  ' Cirrhosis On Lasix at home which will be placed on hold Patient dehydrated and will be started on gentle IV hydration Patient is on SBP prophylaxis , compliant with levofloxacin prior to admission Also currently on Imuran ,, ursodiol Patient not taking her lactulose because of diarrhea  Cachexia Nutrition consult will be obtained  Esophageal varices  Cont PPI Hg dropped since 9/15 Will check stool guiac  Code Status: full Family Communication: family updated about patient's clinical progress Disposition Plan: Discharge plans as per gastroenterology  Brief narrative: HPI:  23 year old female with 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 Patient was admitted for similar c/o on 11/15, with thoughts that her symptoms were due to worsening ascites. Diagnostic paracentesis at that time negative. Her diuretic doses were increased, with good effect and patient was discharged on 11/16. Patient was again seen in the ED at [facility] on 12/10 and improvement of symptoms discharged from the ED. She return to [facility] ER yesterday with a similar exacerbation but could not wait to be seen unless the ER. Because of her multiple episodes of diarrhea the patient has not been using her lactulose.  Denies any fevers, chills, chest pain, difficulty breathing. She denies any dysuria, vaginal discharge. She also denies any hematemesis of hematochezia. Patient and mother report some improvement in her abdominal distention, and do not feel her ascites has worsened. Patient states pain is similar to her previous "episodes." Abd/pelvis CT performed 11/15 with diffuse bowel edema (unchanged from prior.) Repeat CT scan done in the ED today is pending   Upon exam, patient appears in some discomfort, with normal vital signs, afebrile. Heart sounds are normal, lungs CTA b/l. Patient abdomen with mild distention, and moderated diffuse tenderness to palpation. Worst in the suprapubic region and RUQ. Guaiac negative.   ED course  Upreg neg, U/A showing moderate bili, trace leuks, rare squamous and bacteria, 0-2WBC, hyaline crystals, doubt UTI. Lactic acid 4.29 which could indicate infectious process or could be somewhat related to dehydration. CBC w/diff showing chronic baseline anemia H/H 7.9/25.1, no leukocytosis but increased bands with mild left shift. Plt count 94 which is slightly low compared to baseline. Sodium 132, alk phos 222 which is improved from prior results, bili 6.3 which is higher than baseline.  Lipase WNL. EDP discussed with Dr. Rayvon Conley returning page (Tel #  [phone]) for apparently recommended a CT scan and starting the patient on empiric antibiotics. She also recommended paracentesis to rule out SBP  Consultants:  Gastroenterology  Procedures:  None  Antibiotics: Vanc and zosyn  HPI/Subjective: Still having diarrhea and abdominal  pain  Objective: Filed Vitals:   01/14/14 0400 01/14/14 0800 01/14/14 1200 01/14/14 1236  BP: 109/57   111/56  Pulse: 98   90  Temp: 98 F (36.7 C) 98.7 F (37.1 C) 98.3 F (36.8 C)   TempSrc:  Oral Oral   Resp: 16   16  Height:      Weight: 47 kg (103 lb 9.9 oz)     SpO2: 100%   99%    Intake/Output Summary (Last 24 hours) at 01/14/14 1424 Last data filed at 01/14/14 1200  Gross per 24 hour  Intake 1695.83 ml  Output    300 ml  Net 1395.83 ml    Exam:  General: alert & oriented x 3 In NAD  Cardiovascular: RRR, nl S1 s2  Respiratory: Decreased breath sounds at the bases, scattered rhonchi, no crackles  Abdomen: soft +BS NT/ND, no masses palpable  Extremities: No cyanosis and no edema      Data Reviewed: Basic Metabolic Panel:  Recent Labs Lab 01/12/14 1320 01/13/14 0506 01/14/14 0403  NA 132* 129* 132*  K 3.7 3.5* 3.4*  CL 96 94* 97  CO2 _0 GLUCOSE 87 108* 106*  BUN _1 CREATININE 0.63 0.69 0.60  CALCIUM 8.6 8.1* 7.9*  MG 1.3*  --   --     Liver Function Tests:  Recent Labs Lab 01/12/14 1320 01/13/14 0506 01/14/14 0403  AST _2 ALT _3 ALKPHOS 222* 188* 167*  BILITOT 6.3* 5.2* 3.8*  PROT 5.5* 5.3* 5.2*  ALBUMIN 2.1* 1.9* 1.9*    Recent Labs Lab 01/12/14 1320  LIPASE 41    Recent Labs Lab 01/12/14 1930  AMMONIA 40    CBC:  Recent Labs Lab 01/12/14 1320 01/13/14 0506 01/14/14 0403  WBC 10.3 10.5 11.1*  NEUTROABS 9.2*  --   --   HGB 7.9* 7.6* 7.3*  HCT 25.1* 23.7* 22.3*  MCV 96.9 96.7 95.7  PLT 94* 107* 114*    Cardiac Enzymes:  Recent Labs Lab 01/12/14 2200 01/13/14 0506  TROPONINI <0.30 <0.30    BNP (last 3 results) No results for input(s): PROBNP in the last 8760 hours.   CBG: No results for input(s): GLUCAP in the last 168 hours.  Recent Results (from the past 240 hour(s))  Culture, blood (routine x 2)     Status: None (Preliminary result)   Collection Time: 01/12/14  3:44 PM  Result Value Ref Range Status   Specimen Description BLOOD RIGHT HAND  Final   Special Requests BOTTLES DRAWN AEROBIC AND ANAEROBIC 4ML  Final   Culture  Setup Time   Final    01/12/2014 22:45 Performed at [facility]    Culture   Final           BLOOD CULTURE RECEIVED NO GROWTH TO DATE CULTURE WILL BE HELD FOR 5 DAYS BEFORE ISSUING A FINAL NEGATIVE REPORT Performed at [facility]    Report Status PENDING  Incomplete  Culture, blood (routine x 2)     Status: None (Preliminary result)   Collection Time: 01/12/14  3:51 PM  Result Value Ref  Range Status   Specimen Description BLOOD RIGHT HAND  Final   Special Requests BOTTLES DRAWN AEROBIC AND ANAEROBIC 5ML  Final   Culture  Setup Time   Final    01/12/2014 22:42 Performed at [facility]    Culture   Final           BLOOD CULTURE RECEIVED NO GROWTH TO DATE CULTURE WILL BE HELD FOR 5 DAYS BEFORE ISSUING A FINAL NEGATIVE REPORT Performed at [facility]    Report Status PENDING  Incomplete  Urine culture     Status: None   Collection Time: 01/12/14  4:53 PM  Result Value Ref Range Status   Specimen Description URINE, CLEAN CATCH  Final   Special Requests Immunocompromised  Final   Culture  Setup Time   Final    01/12/2014 23:50 Performed at [place] Performed at [facility]   Final   Culture NO GROWTH Performed at [facility]   Final   Report Status 01/14/2014 FINAL  Final  MRSA PCR Screening     Status: None   Collection Time: 01/12/14  6:41 PM  Result Value Ref Range Status   MRSA by PCR NEGATIVE NEGATIVE Final    Comment:        The  GeneXpert MRSA Assay (FDA approved for NASAL specimens only), is one component of a comprehensive MRSA colonization surveillance program. It is not intended to diagnose MRSA infection nor to guide or monitor treatment for MRSA infections.   Clostridium Difficile by PCR     Status: None   Collection Time: 01/13/14  2:17 AM  Result Value Ref Range Status   C difficile by pcr NEGATIVE NEGATIVE Final    Comment: Performed at [facility]  Body fluid culture     Status: None (Preliminary result)   Collection Time: 01/13/14  3:36 PM  Result Value Ref Range Status   Specimen Description PERITONEAL CAVITY  Final   Special Requests Normal  Final   Gram Stain   Final    FEW WBC PRESENT,BOTH PMN AND MONONUCLEAR NO ORGANISMS SEEN Performed at [facility]    Culture NO GROWTH Performed at [facility]   Final   Report Status PENDING  Incomplete     Studies: Ct Abdomen Pelvis W Contrast  01/12/2014   CLINICAL DATA:  Epigastric pain, nausea/vomiting x1 week, autoimmune liver disease/cirrhosis  EXAM: CT ABDOMEN AND PELVIS WITH CONTRAST  TECHNIQUE: Multidetector CT imaging of the abdomen and pelvis was performed using the standard protocol following bolus administration of intravenous contrast.  CONTRAST:  27m OMNIPAQUE IOHEXOL 300 MG/ML  SOLN  COMPARISON:  01/26/2013  FINDINGS: Lower chest:  Lung bases are clear.  Hepatobiliary: Cirrhotic configuration of the liver. Calcified granuloma in the subcapsular left hepatic lobe (series 2/ image 18). No suspicious/enhancing hepatic lesions.  Gallbladder is unremarkable. No intrahepatic or extrahepatic ductal dilatation.  Pancreas: Within normal limits.  Spleen: Splenomegaly, measuring 15.6 cm in craniocaudal dimension.  Adrenals/Urinary Tract: Adrenal glands are unremarkable.  Kidneys are within normal limits.  No hydronephrosis.  Bladder is unremarkable.  Stomach/Bowel: Stomach is unremarkable.  No evidence of bowel obstruction.   Wall thickening involving the colon, much of which can probably be accounted for by mucosal edema related to ascites/hypoalbuminemia. However, there is more significant thickening involving the proximal transverse colon and ascending colon/cecum (for example, series 2/ image 42), and underlying infectious/ inflammatory colitis is  not excluded.  Vascular/Lymphatic: No evidence of abdominal aortic aneurysm.  Gastroesophageal varices. 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 Portal vein is patent.  Circumaortic left renal vein.  No suspicious abdominopelvic lymphadenopathy.  Reproductive: Uterus is unremarkable.  Bilateral ovaries are within normal limits.  Other: Large volume abdominopelvic ascites.  Musculoskeletal: Visualized osseous structures are within normal limits.  IMPRESSION: No evidence of bowel obstruction.  Colonic wall thickening, much of which likely reflects mucosal edema secondary to ascites/type of anemia. However, underlying infectious/inflammatory colitis involving the ascending/proximal transverse colon is not excluded.  Cirrhosis. Splenomegaly. Gastroesophageal varices with patent portal vein and. Large volume abdominopelvic ascites.   Electronically Signed   By: Julian Hy M.D.   On: 01/12/2014 18:21   US Paracentesis  01/13/2014   INDICATION: Cirrhosis, autoimmune hepatitis with PSC overlap syndrome, splenomegaly, ascites. Request is made for diagnostic and therapeutic paracentesis.  EXAM: ULTRASOUND-GUIDED DIAGNOSTIC AND THERAPEUTIC PARACENTESIS  COMPARISON:  None.  MEDICATIONS: None.  COMPLICATIONS: None immediate  TECHNIQUE: Informed written consent was obtained from the patient after a discussion of the risks, benefits and alternatives to treatment. A timeout was performed prior to the initiation of the procedure.  Initial ultrasound scanning demonstrates a moderate amount of ascites within the left lower abdominal quadrant. The left lower abdomen was prepped and draped in the usual sterile  fashion. 1% lidocaine was used for local anesthesia. Under direct ultrasound guidance, a 19 gauge, 7-cm, Yueh catheter was introduced. An ultrasound image was saved for documentation purposed. The paracentesis was performed. The catheter was removed and a dressing was applied. The patient tolerated the procedure well without immediate post procedural complication.  FINDINGS: A total of approximately 2.7 liters of turbid, yellow fluid was removed. Samples were sent to the laboratory as requested by the clinical team.  IMPRESSION: Successful ultrasound-guided diagnostic and therapeutic paracentesis yielding 2.7 liters of peritoneal fluid. The patient will receive IV albumin infusion postprocedure.  Read by: Rowe Robert, PA-C   Electronically Signed   By: Corrie Mckusick D.O.   On: 01/13/2014 15:57   Dg Abd Acute W/chest  01/12/2014   CLINICAL DATA:  Abdominal pain.  Nausea and vomiting  EXAM: ACUTE ABDOMEN SERIES (ABDOMEN 2 VIEW & CHEST 1 VIEW)  COMPARISON:  05/27/2013  FINDINGS: The lungs are clear without infiltrate or effusion.  Mildly dilated small bowel loops with air-fluid levels suggestive of small bowel obstruction. Colon is decompressed. Increased density the abdomen which may be due to fluid-filled bowel loops or ascites.  No free air.  IMPRESSION: Small-bowel obstruction pattern. Consider CT for further evaluation.  Question ascites   Electronically Signed   By: Franchot Gallo M.D.   On: 01/12/2014 14:29    Scheduled Meds: . albumin human  75 g Intravenous Once  . [START ON 01/16/2014] albumin human  75 g Intravenous Once  . azaTHIOprine  100 mg Oral Daily  . cefTRIAXone (ROCEPHIN)  IV  1 g Intravenous Q24H  . pantoprazole  40 mg Oral BID  . predniSONE  5 mg Oral QAC breakfast  . propranolol ER  60 mg Oral Daily   Continuous Infusions: . 0.9 % NaCl with KCl 40 mEq / L 50 mL/hr (01/13/14 0905)    Active Problems:   Abdominal pain, acute   Abdominal pain   Protein-calorie malnutrition,  severe   SBP (spontaneous bacterial peritonitis)   Autoimmune hepatitis     Time spent: 40 minutes   [facility] Pager [phone]. If 7PM-7AM, please contact night-coverage at [email or web address], password  [email or web address] 01/14/2014, 2:24 PM  LOS: 2 days

## 2014-01-14 NOTE — Progress Notes (Signed)
CONSULT FOR MEDICATION ASSISTANCE PATIENT HAS PRIVATE INSURANCE WITH BCBS WITH PRESCRIPTION DRUG COVERAGE

## 2014-01-14 NOTE — Progress Notes (Signed)
    Progress Note   Subjective  Abdominal pain better, still having some loose stools   Objective   Vital signs in last 24 hours: Temp:  [98 F (36.7 C)-99.2 F (37.3 C)] 98.7 F (37.1 C) (12/18 0800) Pulse Rate:  [88-111] 98 (12/18 0400) Resp:  [9-22] 16 (12/18 0400) BP: (91-110)/(42-69) 109/57 mmHg (12/18 0400) SpO2:  [96 %-100 %] 100 % (12/18 0400) Weight:  [103 lb 9.9 oz (47 kg)] 103 lb 9.9 oz (47 kg) (12/18 0400) Last BM Date: 01/13/14 General:    Black female in NAD Abdomen:  Soft, mildly distended, mild mid abdominal tenderness. Extremities:  Without edema. Neurologic:  Alert and oriented,  grossly normal neurologically. Psych:  Cooperative. Normal mood and affect.    Lab Results:  Recent Labs  01/12/14 1320 01/13/14 0506 01/14/14 0403  WBC 10.3 10.5 11.1*  HGB 7.9* 7.6* 7.3*  HCT 25.1* 23.7* 22.3*  PLT 94* 107* 114*   BMET  Recent Labs  01/12/14 1320 01/13/14 0506 01/14/14 0403  NA 132* 129* 132*  K 3.7 3.5* 3.4*  CL 96 94* 97  CO2 22 24 26   GLUCOSE 87 108* 106*  BUN 14 11 9   CREATININE 0.63 0.69 0.60  CALCIUM 8.6 8.1* 7.9*   LFT  Recent Labs  01/14/14 0403  PROT 5.2*  ALBUMIN 1.9*  AST 16  ALT 13  ALKPHOS 167*  BILITOT 3.8*      Assessment / Plan:    571. 23 year old female autoimmune hepatitis with PSC overlap [facility]. Bilirubin improving.   2. SBP. She was already taking weekly Levaquin at home for SBP prophylaxis. Fluid culture pending. Patient wants to go home. I called her Hepatologist, Dr. Piedad Climesarling, this am. She advised IV Rocephin + albumin for 3 days (I will order) followed by additional 7 days of Ceftin or Augmentin (unless culture redirects us). Ninoska obviously sad with this plan but agrees to stay. Mother not in room.   3. Hyponatremia, ? Secondary to diuretics and /or loose stool. Lasix held, Na+ up to 132 this am.    LOS: 2 days   Willette ClusterPaula Guenther  01/14/2014, 10:04 AM     ________________________________________________________________________  [facility] note:  I personally examined the patient, reviewed the data and agree with the assessment and plan described above.  Appreciate Dr. Harrel Lemonarling's input here.  IV abx and albumin for 3 days.  Will return to see her on Monday. Dr.Hung cover [facility] if any question or concerns prior to then.   Rob Buntinganiel Maeli Spacek, MD [facility] Pager [phone]

## 2014-01-15 LAB — COMPREHENSIVE METABOLIC PANEL
ALK PHOS: 201 U/L — AB (ref 39–117)
ALT: 14 U/L (ref 0–35)
AST: 28 U/L (ref 0–37)
Albumin: 3 g/dL — ABNORMAL LOW (ref 3.5–5.2)
Anion gap: 11 (ref 5–15)
BUN: 7 mg/dL (ref 6–23)
CHLORIDE: 99 meq/L (ref 96–112)
CO2: 25 mEq/L (ref 19–32)
Calcium: 8.3 mg/dL — ABNORMAL LOW (ref 8.4–10.5)
Creatinine, Ser: 0.58 mg/dL (ref 0.50–1.10)
GFR calc Af Amer: >90 mL/min (ref >90)
GLUCOSE: 110 mg/dL — AB (ref 70–99)
POTASSIUM: 3.6 meq/L — AB (ref 3.7–5.3)
SODIUM: 135 meq/L — AB (ref 137–147)
Total Bilirubin: 3.2 mg/dL — ABNORMAL HIGH (ref 0.3–1.2)
Total Protein: 5.6 g/dL — ABNORMAL LOW (ref 6.0–8.3)

## 2014-01-15 MED ORDER — MORPHINE SULFATE 2 MG/ML IJ SOLN
INTRAMUSCULAR | Status: AC
  Filled 2014-01-15: qty 1

## 2014-01-15 MED ORDER — MORPHINE SULFATE 2 MG/ML IJ SOLN
1.0000 mg | Freq: Once | INTRAMUSCULAR | Status: AC
  Administered 2014-01-15: 1 mg via INTRAVENOUS
  Filled 2014-01-15: qty 1

## 2014-01-15 NOTE — Progress Notes (Signed)
Progress Note for [facility]  Subjective: Feeling well.  No complaints.  Objective: Vital signs in last 24 hours: Temp:  [98.3 F (36.8 C)-99.2 F (37.3 C)] 98.7 F (37.1 C) (12/19 0700) Pulse Rate:  [81-90] 81 (12/18 1755) Resp:  [13-20] 20 (12/19 0745) BP: (100-114)/(50-63) 102/50 mmHg (12/19 0745) SpO2:  [99 %-100 %] 100 % (12/19 0745) Last BM Date: 01/14/14  Intake/Output from previous day: 12/18 0701 - 12/19 0700 In: 3600 [P.O.:1800; I.V.:1200; IV Piggyback:600] Out: 800 [Urine:800] Intake/Output this shift: Total I/O In: 50 [I.V.:50] Out: -   General appearance: alert and no distress GI: soft, + ascites, nontender  Lab Results:  Recent Labs  01/12/14 1320 01/13/14 0506 01/14/14 0403  WBC 10.3 10.5 11.1*  HGB 7.9* 7.6* 7.3*  HCT 25.1* 23.7* 22.3*  PLT 94* 107* 114*   BMET  Recent Labs  01/13/14 0506 01/14/14 0403 01/15/14 0350  NA 129* 132* 135*  K 3.5* 3.4* 3.6*  CL 94* 97 99  CO2 24 26 25   GLUCOSE 108* 106* 110*  BUN 11 9 7   CREATININE 0.69 0.60 0.58  CALCIUM 8.1* 7.9* 8.3*   LFT  Recent Labs  01/15/14 0350  PROT 5.6*  ALBUMIN 3.0*  AST 28  ALT 14  ALKPHOS 201*  BILITOT 3.2*   PT/INR No results for input(s): LABPROT, INR in the last 72 hours. Hepatitis Panel No results for input(s): HEPBSAG, HCVAB, HEPAIGM, HEPBIGM in the last 72 hours. C-Diff No results for input(s): CDIFFTOX in the last 72 hours. Fecal Lactopherrin No results for input(s): FECLLACTOFRN in the last 72 hours.  Studies/Results: [place] Paracentesis  01/13/2014   INDICATION: Cirrhosis, autoimmune hepatitis with PSC overlap syndrome, splenomegaly, ascites. Request is made for diagnostic and therapeutic paracentesis.  EXAM: ULTRASOUND-GUIDED DIAGNOSTIC AND THERAPEUTIC PARACENTESIS  COMPARISON:  None.  MEDICATIONS: None.  COMPLICATIONS: None immediate  TECHNIQUE: Informed written consent was obtained from the patient after a discussion of the risks, benefits and  alternatives to treatment. A timeout was performed prior to the initiation of the procedure.  Initial ultrasound scanning demonstrates a moderate amount of ascites within the left lower abdominal quadrant. The left lower abdomen was prepped and draped in the usual sterile fashion. 1% lidocaine was used for local anesthesia. Under direct ultrasound guidance, a 19 gauge, 7-cm, Yueh catheter was introduced. An ultrasound image was saved for documentation purposed. The paracentesis was performed. The catheter was removed and a dressing was applied. The patient tolerated the procedure well without immediate post procedural complication.  FINDINGS: A total of approximately 2.7 liters of turbid, yellow fluid was removed. Samples were sent to the laboratory as requested by the clinical team.  IMPRESSION: Successful ultrasound-guided diagnostic and therapeutic paracentesis yielding 2.7 liters of peritoneal fluid. The patient will receive IV albumin infusion postprocedure.  Read by: Jeananne RamaKevin Allred, PA-C   Electronically Signed   By: Gilmer MorJaime  Wagner D.O.   On: 01/13/2014 15:57    Medications:  Scheduled: . [START ON 01/16/2014] albumin human  75 g Intravenous Once  . azaTHIOprine  100 mg Oral Daily  . cefTRIAXone (ROCEPHIN)  IV  1 g Intravenous Q24H  . pantoprazole  40 mg Oral BID  . predniSONE  5 mg Oral QAC breakfast  . propranolol ER  60 mg Oral Daily   Continuous: . 0.9 % NaCl with KCl 40 mEq / L 50 mL/hr (01/14/14 1832)    Assessment/Plan: 1) SBP. 2) Autoimmune hepatitis.   She is stable at this time.  [facility]  recommendations she is on ceftriaxone and albumin.  Plan: 1) Continue with current treatment.   LOS: 3 days   Orlen Leedy D 01/15/2014, 9:06 AM

## 2014-01-15 NOTE — Progress Notes (Signed)
[facility] PROGRESS NOTE  AISHI COURTS WJX:[id number] DOB: 1990-10-27 DOA: 01/12/2014 PCP: Woodfin Ganja, MD  Assessment/Plan: Abdominal pain  -secondary to SBP. -Please see below for details.  Spontaneous bacterial peritonitis -As per recommendations from her GI physician at [facility] will treat with Rocephin for 3 days (today is day 2 out of 3) then can transition to 7 days of Augmentin.  Hyponatremia -Resolved. Sodium 135 today.  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  History of esophageal varices -Continue PPI. No sign of active bleeding.  Code Status: Full code Family Communication: Patient only  Disposition Plan: Hopeful for discharge home in the morning   Consultants:  GI   Antibiotics:  Rocephin   Subjective: No complaints. Anxious to go home.  Objective: Filed Vitals:   01/15/14 1154 01/15/14 1200 01/15/14 1345 01/15/14 1419  BP: 95/65  104/62   Pulse:   60   Temp:  98.7 F (37.1 C) 98.2 F (36.8 C)   TempSrc:  Oral Oral   Resp: 14  14   Height:    4\' 11"  (1.499 m)  Weight:    48.9 kg (107 lb 12.9 oz)  SpO2: 100%  99%     Intake/Output Summary (Last 24 hours) at 01/15/14 1629 Last data filed at 01/15/14 1309  Gross per 24 hour  Intake 2392.5 ml  Output    800 ml  Net 1592.5 ml   Filed Weights   01/13/14 0357 01/14/14 0400 01/15/14 1419  Weight: 47 kg (103 lb 9.9 oz) 47 kg (103 lb 9.9 oz) 48.9 kg (107 lb 12.9 oz)    Exam:   General:  Alert, awake, oriented 3  Cardiovascular: Regular rate and rhythm  Respiratory: Clear to auscultation bilaterally  Abdomen: Slightly distended, positive bowel sounds  Extremities: No clubbing, cyanosis or edema   Neurologic:  Intact, nonfocal  Data Reviewed: Basic Metabolic Panel:  Recent Labs Lab 01/12/14 1320 01/13/14 0506 01/14/14 0403 01/15/14 0350  NA 132* 129* 132* 135*  K 3.7 3.5* 3.4* 3.6*  CL 96 94* 97 99  CO2 22 24 26 25   GLUCOSE 87 108* 106* 110*  BUN 14  11 9 7   CREATININE 0.63 0.69 0.60 0.58  CALCIUM 8.6 8.1* 7.9* 8.3*  MG 1.3*  --   --   --    Liver Function Tests:  Recent Labs Lab 01/12/14 1320 01/13/14 0506 01/14/14 0403 01/15/14 0350  AST 19 16 16 28   ALT 21 17 13 14   ALKPHOS 222* 188* 167* 201*  BILITOT 6.3* 5.2* 3.8* 3.2*  PROT 5.5* 5.3* 5.2* 5.6*  ALBUMIN 2.1* 1.9* 1.9* 3.0*    Recent Labs Lab 01/12/14 1320  LIPASE 41    Recent Labs Lab 01/12/14 1930  AMMONIA 40   CBC:  Recent Labs Lab 01/12/14 1320 01/13/14 0506 01/14/14 0403  WBC 10.3 10.5 11.1*  NEUTROABS 9.2*  --   --   HGB 7.9* 7.6* 7.3*  HCT 25.1* 23.7* 22.3*  MCV 96.9 96.7 95.7  PLT 94* 107* 114*   Cardiac Enzymes:  Recent Labs Lab 01/12/14 2200 01/13/14 0506  TROPONINI <0.30 <0.30   BNP (last 3 results) No results for input(s): PROBNP in the last 8760 hours. CBG: No results for input(s): GLUCAP in the last 168 hours.  Recent Results (from the past 240 hour(s))  Culture, blood (routine x 2)     Status: None (Preliminary result)   Collection Time: 01/12/14  3:44 PM  Result Value Ref Range Status  Specimen Description BLOOD RIGHT HAND  Final   Special Requests BOTTLES DRAWN AEROBIC AND ANAEROBIC 4ML  Final   Culture  Setup Time   Final    01/12/2014 22:45 Performed at [facility]    Culture   Final           BLOOD CULTURE RECEIVED NO GROWTH TO DATE CULTURE WILL BE HELD FOR 5 DAYS BEFORE ISSUING A FINAL NEGATIVE REPORT Performed at [facility]    Report Status PENDING  Incomplete  Culture, blood (routine x 2)     Status: None (Preliminary result)   Collection Time: 01/12/14  3:51 PM  Result Value Ref Range Status   Specimen Description BLOOD RIGHT HAND  Final   Special Requests BOTTLES DRAWN AEROBIC AND ANAEROBIC 5ML  Final   Culture  Setup Time   Final    01/12/2014 22:42 Performed at [facility]    Culture   Final           BLOOD CULTURE RECEIVED NO GROWTH TO DATE CULTURE WILL BE HELD FOR 5  DAYS BEFORE ISSUING A FINAL NEGATIVE REPORT Performed at [facility]    Report Status PENDING  Incomplete  Urine culture     Status: None   Collection Time: 01/12/14  4:53 PM  Result Value Ref Range Status   Specimen Description URINE, CLEAN CATCH  Final   Special Requests Immunocompromised  Final   Culture  Setup Time   Final    01/12/2014 23:50 Performed at [facility]    Colony Count NO GROWTH Performed at [facility]   Final   Culture NO GROWTH Performed at [facility]   Final   Report Status 01/14/2014 FINAL  Final  MRSA PCR Screening     Status: None   Collection Time: 01/12/14  6:41 PM  Result Value Ref Range Status   MRSA by PCR NEGATIVE NEGATIVE Final    Comment:        The GeneXpert MRSA Assay (FDA approved for NASAL specimens only), is one component of a comprehensive MRSA colonization surveillance program. It is not intended to diagnose MRSA infection nor to guide or monitor treatment for MRSA infections.   Clostridium Difficile by PCR     Status: None   Collection Time: 01/13/14  2:17 AM  Result Value Ref Range Status   C difficile by pcr NEGATIVE NEGATIVE Final    Comment: [facility]  Body fluid culture     Status: None (Preliminary result)   Collection Time: 01/13/14  3:36 PM  Result Value Ref Range Status   Specimen Description PERITONEAL CAVITY  Final   Special Requests Normal  Final   Gram Stain   Final    FEW WBC PRESENT,BOTH PMN AND MONONUCLEAR NO ORGANISMS SEEN Performed at [facility]    Culture   Final    NO GROWTH 2 DAYS Performed at [facility]    Report Status PENDING  Incomplete  Stool culture     Status: None (Preliminary result)   Collection Time: 01/13/14 10:33 PM  Result Value Ref Range Status   Specimen Description STOOL  Final   Special Requests Immunocompromised  Final   Culture   Final    NO SUSPICIOUS COLONIES, CONTINUING TO HOLD Note: REDUCED  NORMAL FLORA PRESENT Performed at [facility]    Report Status PENDING  Incomplete     Studies: No results found.  Scheduled Meds: . [  START ON 01/16/2014] albumin human  75 g Intravenous Once  . azaTHIOprine  100 mg Oral Daily  . cefTRIAXone (ROCEPHIN)  IV  1 g Intravenous Q24H  . pantoprazole  40 mg Oral BID  . predniSONE  5 mg Oral QAC breakfast  . propranolol ER  60 mg Oral Daily   Continuous Infusions: . 0.9 % NaCl with KCl 40 mEq / L 50 mL/hr (01/15/14 1436)    Active Problems:   Abdominal pain, acute   Abdominal pain   Protein-calorie malnutrition, severe   SBP (spontaneous bacterial peritonitis)   Autoimmune hepatitis    Time spent: 25 minutes. Greater than 50% of this time was spent in direct contact with the patient coordinating care.    Chaya JanHERNANDEZ ACOSTA,ESTELA  [facility] Pager [phone]  If 7PM-7AM, please contact night-coverage at [email or web address], password [email or web address] 01/15/2014, 4:29 PM  LOS: 3 days

## 2014-01-16 DIAGNOSIS — R1084 Generalized abdominal pain: Secondary | ICD-10-CM

## 2014-01-16 DIAGNOSIS — E43 Unspecified severe protein-calorie malnutrition: Secondary | ICD-10-CM

## 2014-01-16 LAB — BASIC METABOLIC PANEL
ANION GAP: 11 (ref 5–15)
BUN: 5 mg/dL — ABNORMAL LOW (ref 6–23)
CALCIUM: 8.4 mg/dL (ref 8.4–10.5)
CO2: 22 mEq/L (ref 19–32)
Chloride: 101 mEq/L (ref 96–112)
Creatinine, Ser: 0.52 mg/dL (ref 0.50–1.10)
Glucose, Bld: 82 mg/dL (ref 70–99)
Potassium: 4.1 mEq/L (ref 3.7–5.3)
SODIUM: 134 meq/L — AB (ref 137–147)

## 2014-01-16 LAB — BODY FLUID CULTURE
Culture: NO GROWTH
Special Requests: NORMAL

## 2014-01-16 MED ORDER — ONDANSETRON HCL 4 MG PO TABS: 4.0000 mg | ORAL_TABLET | Freq: Four times a day (QID) | ORAL | Status: DC | PRN

## 2014-01-16 MED ORDER — VITAMINS A & D EX OINT
TOPICAL_OINTMENT | CUTANEOUS | Status: AC
  Filled 2014-01-16: qty 5

## 2014-01-16 MED ORDER — AMOXICILLIN-POT CLAVULANATE 875-125 MG PO TABS: 1.0000 | ORAL_TABLET | Freq: Two times a day (BID) | ORAL | Status: DC

## 2014-01-16 MED ORDER — OXYCODONE HCL 5 MG PO TABS: 5.0000 mg | ORAL_TABLET | ORAL | Status: DC | PRN

## 2014-01-16 NOTE — Progress Notes (Signed)
Pt left at this time with her mother. Pt alert and oriented. Discharge instructions/prescriptions given/explained with pt verbalizing understanding. [facility]. Last dose of Rocephin IV given and Albumin given IV prior to d/c.

## 2014-01-16 NOTE — Discharge Summary (Signed)
Physician Discharge Summary  Melanie Conley WUJ:[id number] DOB: 11-Oct-1990 DOA: 01/12/2014  PCP: [name],[name], MD  Admit date: 01/12/2014 Discharge date: 01/16/2014  Time spent: 45 minutes  Recommendations for Outpatient Follow-up:  -Will be discharged home today. -Ad[facility] scheduled for beginning of January.  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 Patient was admitted for similar c/o on 11/15, with thoughts that her symptoms were due to worsening ascites. Diagnostic paracentesis at that time negative. Her diuretic doses were increased, with good effect and patient was discharged on 11/16. [facility] and improvement of symptoms discharged from the ED. [facility] with a similar exacerbation but could not wait to be seen unless the ER. Because of her multiple episodes of diarrhea the patient has not been using her lactulose.  Denies any fevers, chills, chest pain, difficulty breathing. She denies any dysuria, vaginal discharge. She also denies any hematemesis of hematochezia. Patient and mother report some improvement in her abdominal distention, and do not feel her ascites has worsened. Patient states pain is similar to her  previous "episodes." Abd/pelvis CT performed 11/15 with diffuse bowel edema (unchanged from prior.) Repeat CT scan done in the ED today is pending   Upon exam, patient appears in some discomfort, with normal vital signs, afebrile. Heart sounds are normal, lungs CTA b/l. Patient abdomen with mild distention, and moderated diffuse tenderness to palpation. Worst in the suprapubic region and RUQ. Guaiac negative.   Hospital Course:   Abdominal pain  -secondary to SBP. -Please see below for details. -Resolved and tolerating a solid diet.  Spontaneous bacterial peritonitis -As per recommendations [facility] will treat with Rocephin for 3 days (today is day 2 out of 3) then can transition to 7 days of Augmentin.Will DC home today.  Hyponatremia -Resolved. Sodium 135 today.  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  History of esophageal varices -Continue PPI. No sign of active bleeding.   Procedures:  None  Consultations:  GI  Discharge Instructions      Discharge Instructions    Increase activity slowly    Complete by:  As directed             Medication List    TAKE these medications        amoxicillin-clavulanate 875-125 MG per tablet  Commonly known as:  AUGMENTIN  Take 1 tablet by mouth 2 (two) times daily.     azaTHIOprine 50 MG tablet  Commonly known as:  IMURAN  Take 100 mg by mouth daily.     furosemide 40 MG tablet  Commonly known as:  LASIX  Take 40-80 mg by mouth daily.     lactulose 10 GM/15ML solution  Commonly known as:  CHRONULAC  Take 20 g by mouth 2 (two) times daily as needed (constipation).     levofloxacin 750 MG tablet  Commonly known as:  LEVAQUIN  Take 750 mg by mouth once a week. fridays     omeprazole 20 MG capsule  Commonly known as:  PRILOSEC  Take 20 mg by mouth daily.     ondansetron 4 MG tablet  Commonly known as:  ZOFRAN  Take 1 tablet (4 mg total) by mouth every 6 (six) hours as needed for  nausea.     oxyCODONE 5 MG immediate release tablet  Commonly known as:  Oxy IR/ROXICODONE  Take 1-2 tablets (5-10 mg total) by mouth every 4 (four) hours as needed for moderate pain.     predniSONE 5 MG tablet  Commonly known as:  DELTASONE  Take 5 mg by mouth daily.     propranolol ER 60 MG 24 hr capsule  Commonly known as:  INDERAL LA  Take 60 mg by mouth daily.     spironolactone 25 MG tablet  Commonly known as:  ALDACTONE  Take 100 mg by mouth daily.     ursodiol 300 MG capsule  Commonly known as:  ACTIGALL  Take 600 mg by mouth daily.       No Known Allergies Follow-up Information    Follow up with [name],[name], MD.   Specialty:  Internal Medicine   Why:  As scheduled for beginning of January       The results of significant diagnostics from this hospitalization (including imaging, microbiology, ancillary and laboratory) are listed below for reference.    Significant Diagnostic Studies: Ct Abdomen Pelvis W Contrast  01/12/2014   CLINICAL DATA:  Epigastric pain, nausea/vomiting x1 week, autoimmune liver disease/cirrhosis  EXAM: CT ABDOMEN AND PELVIS WITH CONTRAST  TECHNIQUE: Multidetector CT imaging of the abdomen and pelvis was performed using the standard protocol following bolus administration of intravenous contrast.  CONTRAST:  80mL OMNIPAQUE IOHEXOL 300 MG/ML  SOLN  COMPARISON:  01/26/2013  FINDINGS: Lower chest:  Lung bases are clear.  Hepatobiliary: Cirrhotic configuration of the liver. Calcified granuloma in the subcapsular left hepatic lobe (series 2/ image 18). No suspicious/enhancing hepatic lesions.  Gallbladder is unremarkable. No intrahepatic or extrahepatic ductal dilatation.  Pancreas: Within normal limits.  Spleen: Splenomegaly, measuring 15.6 cm in craniocaudal dimension.  Adrenals/Urinary Tract: Adrenal glands are unremarkable.  Kidneys are within normal limits.  No hydronephrosis.  Bladder is unremarkable.  Stomach/Bowel: Stomach is unremarkable.  No  evidence of bowel obstruction.  Wall thickening involving the colon, much of which can probably be accounted for by mucosal edema related to ascites/hypoalbuminemia. However, there is more significant thickening involving the proximal transverse colon and ascending colon/cecum (for example, series 2/ image 42), and underlying infectious/ inflammatory colitis is not excluded.  Vascular/Lymphatic: No evidence of abdominal aortic aneurysm.  Gastroesophageal varices. 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 Portal vein is patent.  Circumaortic left renal vein.  No suspicious abdominopelvic lymphadenopathy.  Reproductive: Uterus is unremarkable.  Bilateral ovaries are within normal limits.  Other: Large volume abdominopelvic ascites.  Musculoskeletal: Visualized osseous structures are within normal limits.  IMPRESSION: No evidence of bowel obstruction.  Colonic wall thickening, much of which likely reflects mucosal edema secondary to ascites/type of anemia. However, underlying infectious/inflammatory colitis involving the ascending/proximal transverse colon is not excluded.  Cirrhosis. Splenomegaly. Gastroesophageal varices with patent portal vein and. Large volume abdominopelvic ascites.   Electronically Signed   By: Roselie AwkwardSriyesh  Krishnan M.D.  On: 01/12/2014 18:21   US Paracentesis  01/13/2014   INDICATION: Cirrhosis, autoimmune hepatitis with PSC overlap syndrome, splenomegaly, ascites. Request is made for diagnostic and therapeutic paracentesis.  EXAM: ULTRASOUND-GUIDED DIAGNOSTIC AND THERAPEUTIC PARACENTESIS  COMPARISON:  None.  MEDICATIONS: None.  COMPLICATIONS: None immediate  TECHNIQUE: Informed written consent was obtained from the patient after a discussion of the risks, benefits and alternatives to treatment. A timeout was performed prior to the initiation of the procedure.  Initial ultrasound scanning demonstrates a moderate amount of ascites within the left lower abdominal quadrant. The left lower abdomen was prepped  and draped in the usual sterile fashion. 1% lidocaine was used for local anesthesia. Under direct ultrasound guidance, a 19 gauge, 7-cm, Yueh catheter was introduced. An ultrasound image was saved for documentation purposed. The paracentesis was performed. The catheter was removed and a dressing was applied. The patient tolerated the procedure well without immediate post procedural complication.  FINDINGS: A total of approximately 2.7 liters of turbid, yellow fluid was removed. Samples were sent to the laboratory as requested by the clinical team.  IMPRESSION: Successful ultrasound-guided diagnostic and therapeutic paracentesis yielding 2.7 liters of peritoneal fluid. The patient will receive IV albumin infusion postprocedure.  Read by: Jeananne Rama, PA-C   Electronically Signed   By: Gilmer Mor D.O.   On: 01/13/2014 15:57   Dg Abd Acute W/chest  01/12/2014   CLINICAL DATA:  Abdominal pain.  Nausea and vomiting  EXAM: ACUTE ABDOMEN SERIES (ABDOMEN 2 VIEW & CHEST 1 VIEW)  COMPARISON:  05/27/2013  FINDINGS: The lungs are clear without infiltrate or effusion.  Mildly dilated small bowel loops with air-fluid levels suggestive of small bowel obstruction. Colon is decompressed. Increased density the abdomen which may be due to fluid-filled bowel loops or ascites.  No free air.  IMPRESSION: Small-bowel obstruction pattern. Consider CT for further evaluation.  Question ascites   Electronically Signed   By: Marlan Palau M.D.   On: 01/12/2014 14:29    Microbiology: Recent Results (from the past 240 hour(s))  Culture, blood (routine x 2)     Status: None (Preliminary result)   Collection Time: 01/12/14  3:44 PM  Result Value Ref Range Status   Specimen Description BLOOD RIGHT HAND  Final   Special Requests BOTTLES DRAWN AEROBIC AND ANAEROBIC  Final   Culture  Setup Time   Final    01/12/2014 22:45 Performed at [facility]    Culture   Final           BLOOD CULTURE RECEIVED NO GROWTH TO DATE  CULTURE WILL BE HELD FOR 5 DAYS BEFORE ISSUING A FINAL NEGATIVE REPORT Performed at [facility]    Report Status PENDING  Incomplete  Culture, blood (routine x 2)     Status: None (Preliminary result)   Collection Time: 01/12/14  3:51 PM  Result Value Ref Range Status   Specimen Description BLOOD RIGHT HAND  Final   Special Requests BOTTLES DRAWN AEROBIC AND ANAEROBIC  Final   Culture  Setup Time   Final    01/12/2014 22:42 Performed at [facility]    Culture   Final           BLOOD CULTURE RECEIVED NO GROWTH TO DATE CULTURE WILL BE HELD FOR 5 DAYS BEFORE ISSUING A FINAL NEGATIVE REPORT Performed at [facility]    Report Status PENDING  Incomplete  Urine culture     Status: None   Collection Time:  01/12/14  4:53 PM  Result Value Ref Range Status   Specimen Description URINE, CLEAN CATCH  Final   Special Requests Immunocompromised  Final   Culture  Setup Time   Final    01/12/2014 23:50 Performed at [facility]    Colony Count NO GROWTH Performed at [facility]   Final   Culture NO GROWTH Performed at [facility]   Final   Report Status 01/14/2014 FINAL  Final  MRSA PCR Screening     Status: None   Collection Time: 01/12/14  6:41 PM  Result Value Ref Range Status   MRSA by PCR NEGATIVE NEGATIVE Final    Comment:        The GeneXpert MRSA Assay (FDA approved for NASAL specimens only), is one component of a comprehensive MRSA colonization surveillance program. It is not intended to diagnose MRSA infection nor to guide or monitor treatment for MRSA infections.   Clostridium Difficile by PCR     Status: None   Collection Time: 01/13/14  2:17 AM  Result Value Ref Range Status   C difficile by pcr NEGATIVE NEGATIVE Final    Comment: Performed at [facility]  Body fluid culture     Status: None   Collection Time: 01/13/14  3:36 PM  Result Value Ref Range Status   Specimen Description PERITONEAL CAVITY   Final   Special Requests Normal  Final   Gram Stain   Final    FEW WBC PRESENT,BOTH PMN AND MONONUCLEAR NO ORGANISMS SEEN Performed at [facility]    Culture   Final    NO GROWTH 3 DAYS Performed at [facility]    Report Status 01/16/2014 FINAL  Final  Stool culture     Status: None (Preliminary result)   Collection Time: 01/13/14 10:33 PM  Result Value Ref Range Status   Specimen Description STOOL  Final   Special Requests Immunocompromised  Final   Culture   Final    NO SUSPICIOUS COLONIES, CONTINUING TO HOLD Note: REDUCED NORMAL FLORA PRESENT Performed at [facility]    Report Status PENDING  Incomplete     Labs: Basic Metabolic Panel:  Recent Labs Lab 01/12/14 1320 01/13/14 0506 01/14/14 0403 01/15/14 0350 01/16/14 0515  NA 132* 129* 132* 135* 134*  K 3.7 3.5* 3.4* 3.6* 4.1  CL 96 94* 97 99 101  CO2 22 24 26 25 22   GLUCOSE 87 108* 106* 110* 82  BUN 14 11 9 7  5*  CREATININE 0.63 0.69 0.60 0.58 0.52  CALCIUM 8.6 8.1* 7.9* 8.3* 8.4  MG 1.3*  --   --   --   --    Liver Function Tests:  Recent Labs Lab 01/12/14 1320 01/13/14 0506 01/14/14 0403 01/15/14 0350  AST 19 16 16 28   ALT 21 17 13 14   ALKPHOS 222* 188* 167* 201*  BILITOT 6.3* 5.2* 3.8* 3.2*  PROT 5.5* 5.3* 5.2* 5.6*  ALBUMIN 2.1* 1.9* 1.9* 3.0*    Recent Labs Lab 01/12/14 1320  LIPASE 41    Recent Labs Lab 01/12/14 1930  AMMONIA 40   CBC:  Recent Labs Lab 01/12/14 1320 01/13/14 0506 01/14/14 0403  WBC 10.3 10.5 11.1*  NEUTROABS 9.2*  --   --   HGB 7.9* 7.6* 7.3*  HCT 25.1* 23.7* 22.3*  MCV 96.9 96.7 95.7  PLT 94* 107* 114*   Cardiac Enzymes:  Recent Labs Lab 01/12/14 2200 01/13/14 0506  TROPONINI <0.30 <0.30  BNP: BNP (last 3 results) No results for input(s): PROBNP in the last 8760 hours. CBG: No results for input(s): GLUCAP in the last 168 hours.     SignedChaya Jan  [facility] Pager:  [phone] 01/16/2014, 7:33 PM

## 2014-01-16 NOTE — Progress Notes (Signed)
Progress Note for [facility]  Subjective: No complaints, but she wants to go home.  Objective: Vital signs in last 24 hours: Temp:  [98 F (36.7 C)-98.7 F (37.1 C)] 98.4 F (36.9 C) (12/20 0609) Pulse Rate:  [60-82] 82 (12/20 0609) Resp:  [14-20] 15 (12/20 0609) BP: (95-105)/(50-69) 98/50 mmHg (12/20 0609) SpO2:  [99 %-100 %] 100 % (12/20 0609) Weight:  [48.9 kg (107 lb 12.9 oz)] 48.9 kg (107 lb 12.9 oz) (12/19 1419) Last BM Date: 01/14/14  Intake/Output from previous day: 12/19 0701 - 12/20 0700 In: 745 [P.O.:120; I.V.:575; IV Piggyback:50] Out: -  Intake/Output this shift:    General appearance: alert and no distress GI: soft, distended with ascites.  No pain  Lab Results:  Recent Labs  01/14/14 0403  WBC 11.1*  HGB 7.3*  HCT 22.3*  PLT 114*   BMET  Recent Labs  01/14/14 0403 01/15/14 0350 01/16/14 0515  NA 132* 135* 134*  K 3.4* 3.6* 4.1  CL 97 99 101  CO2 26 25 22   GLUCOSE 106* 110* 82  BUN 9 7 5*  CREATININE 0.60 0.58 0.52  CALCIUM 7.9* 8.3* 8.4   LFT  Recent Labs  01/15/14 0350  PROT 5.6*  ALBUMIN 3.0*  AST 28  ALT 14  ALKPHOS 201*  BILITOT 3.2*   PT/INR No results for input(s): LABPROT, INR in the last 72 hours. Hepatitis Panel No results for input(s): HEPBSAG, HCVAB, HEPAIGM, HEPBIGM in the last 72 hours. C-Diff No results for input(s): CDIFFTOX in the last 72 hours. Fecal Lactopherrin No results for input(s): FECLLACTOFRN in the last 72 hours.  Studies/Results: No results found.  Medications:  Scheduled: . albumin human  75 g Intravenous Once  . azaTHIOprine  100 mg Oral Daily  . cefTRIAXone (ROCEPHIN)  IV  1 g Intravenous Q24H  . pantoprazole  40 mg Oral BID  . predniSONE  5 mg Oral QAC breakfast  . propranolol ER  60 mg Oral Daily   Continuous: . 0.9 % NaCl with KCl 40 mEq / L 50 mL/hr (01/15/14 1436)    Assessment/Plan: 1) SBP. 2) Autoimmune hepatitis.   The patient needs one more day of IV antibiotics and IV  albumin.  Today will be the third day.  Hopefully she can be discharged home tomorrow.  Plan: 1) Continue with current care.   LOS: 4 days   Zenna Traister D 01/16/2014, 7:37 AM

## 2014-01-17 LAB — STOOL CULTURE

## 2014-01-18 LAB — CULTURE, BLOOD (ROUTINE X 2)
CULTURE: NO GROWTH
Culture: NO GROWTH

## 2014-04-07 ENCOUNTER — Emergency Department (HOSPITAL_COMMUNITY)
Admission: EM | Admit: 2014-04-07 | Discharge: 2014-04-07 | Disposition: A | Discharge status: Home / Self Care | Payer: BC Managed Care – PPO | Attending: Emergency Medicine | Admitting: Emergency Medicine

## 2014-04-07 ENCOUNTER — Emergency Department (HOSPITAL_COMMUNITY): Payer: BC Managed Care – PPO

## 2014-04-07 ENCOUNTER — Encounter (HOSPITAL_COMMUNITY): Payer: Self-pay | Admitting: Emergency Medicine

## 2014-04-07 DIAGNOSIS — Z862 Personal history of diseases of the blood and blood-forming organs and certain disorders involving the immune mechanism: Secondary | ICD-10-CM | POA: Diagnosis not present

## 2014-04-07 DIAGNOSIS — Z3202 Encounter for pregnancy test, result negative: Secondary | ICD-10-CM | POA: Insufficient documentation

## 2014-04-07 DIAGNOSIS — R1084 Generalized abdominal pain: Secondary | ICD-10-CM | POA: Insufficient documentation

## 2014-04-07 DIAGNOSIS — R197 Diarrhea, unspecified: Secondary | ICD-10-CM | POA: Insufficient documentation

## 2014-04-07 DIAGNOSIS — R188 Other ascites: Secondary | ICD-10-CM | POA: Insufficient documentation

## 2014-04-07 DIAGNOSIS — Z8679 Personal history of other diseases of the circulatory system: Secondary | ICD-10-CM | POA: Diagnosis not present

## 2014-04-07 DIAGNOSIS — R112 Nausea with vomiting, unspecified: Secondary | ICD-10-CM | POA: Insufficient documentation

## 2014-04-07 DIAGNOSIS — Z792 Long term (current) use of antibiotics: Secondary | ICD-10-CM | POA: Insufficient documentation

## 2014-04-07 DIAGNOSIS — Z8719 Personal history of other diseases of the digestive system: Secondary | ICD-10-CM | POA: Insufficient documentation

## 2014-04-07 DIAGNOSIS — Z7952 Long term (current) use of systemic steroids: Secondary | ICD-10-CM | POA: Diagnosis not present

## 2014-04-07 DIAGNOSIS — R140 Abdominal distension (gaseous): Secondary | ICD-10-CM | POA: Diagnosis not present

## 2014-04-07 DIAGNOSIS — Z79899 Other long term (current) drug therapy: Secondary | ICD-10-CM | POA: Diagnosis not present

## 2014-04-07 DIAGNOSIS — R1013 Epigastric pain: Secondary | ICD-10-CM | POA: Diagnosis present

## 2014-04-07 LAB — CBC WITH DIFFERENTIAL/PLATELET
BASOS ABS: 0 10*3/uL (ref 0.0–0.1)
BASOS PCT: 1 % (ref 0–1)
EOS ABS: 0.2 10*3/uL (ref 0.0–0.7)
EOS PCT: 4 % (ref 0–5)
HEMATOCRIT: 26.3 % — AB (ref 36.0–46.0)
HEMOGLOBIN: 8.1 g/dL — AB (ref 12.0–15.0)
LYMPHS PCT: 10 % — AB (ref 12–46)
Lymphs Abs: 0.4 10*3/uL — ABNORMAL LOW (ref 0.7–4.0)
MCH: 29.3 pg (ref 26.0–34.0)
MCHC: 30.8 g/dL (ref 30.0–36.0)
MCV: 95.3 fL (ref 78.0–100.0)
Monocytes Absolute: 0.5 10*3/uL (ref 0.1–1.0)
Monocytes Relative: 12 % (ref 3–12)
NEUTROS PCT: 73 % (ref 43–77)
Neutro Abs: 3.3 10*3/uL (ref 1.7–7.7)
Platelets: 76 10*3/uL — ABNORMAL LOW (ref 150–400)
RBC: 2.76 MIL/uL — AB (ref 3.87–5.11)
RDW: 19.1 % — AB (ref 11.5–15.5)
WBC: 4.4 10*3/uL (ref 4.0–10.5)

## 2014-04-07 LAB — COMPREHENSIVE METABOLIC PANEL
ALT: 58 U/L — AB (ref 0–35)
AST: 61 U/L — AB (ref 0–37)
Albumin: 2.8 g/dL — ABNORMAL LOW (ref 3.5–5.2)
Alkaline Phosphatase: 427 U/L — ABNORMAL HIGH (ref 39–117)
Anion gap: 5 (ref 5–15)
BUN: 13 mg/dL (ref 6–23)
CALCIUM: 8.1 mg/dL — AB (ref 8.4–10.5)
CO2: 22 mmol/L (ref 19–32)
CREATININE: 0.75 mg/dL (ref 0.50–1.10)
Chloride: 111 mmol/L (ref 96–112)
GFR calc Af Amer: >90 mL/min (ref >90)
GFR calc non Af Amer: >90 mL/min (ref >90)
Glucose, Bld: 87 mg/dL (ref 70–99)
POTASSIUM: 3.6 mmol/L (ref 3.5–5.1)
SODIUM: 138 mmol/L (ref 135–145)
Total Bilirubin: 2.9 mg/dL — ABNORMAL HIGH (ref 0.3–1.2)
Total Protein: 6.2 g/dL (ref 6.0–8.3)

## 2014-04-07 LAB — URINALYSIS, ROUTINE W REFLEX MICROSCOPIC
Glucose, UA: NEGATIVE mg/dL
Hgb urine dipstick: NEGATIVE
KETONES UR: NEGATIVE mg/dL
Nitrite: NEGATIVE
Protein, ur: NEGATIVE mg/dL
SPECIFIC GRAVITY, URINE: 1.026 (ref 1.005–1.030)
Urobilinogen, UA: 2 mg/dL — ABNORMAL HIGH (ref 0.0–1.0)
pH: 6 (ref 5.0–8.0)

## 2014-04-07 LAB — URINE MICROSCOPIC-ADD ON

## 2014-04-07 LAB — PROTEIN, BODY FLUID: Total protein, fluid: <3 g/dL

## 2014-04-07 LAB — LIPASE, BLOOD: Lipase: 35 U/L (ref 11–59)

## 2014-04-07 LAB — LACTATE DEHYDROGENASE, PLEURAL OR PERITONEAL FLUID: LD, Fluid: 24 U/L — ABNORMAL HIGH (ref 3–23)

## 2014-04-07 LAB — POC OCCULT BLOOD, ED: Fecal Occult Bld: POSITIVE — AB

## 2014-04-07 LAB — I-STAT CG4 LACTIC ACID, ED
Lactic Acid, Venous: 0.6 mmol/L (ref 0.5–2.0)
Lactic Acid, Venous: 0.62 mmol/L (ref 0.5–2.0)

## 2014-04-07 LAB — ALBUMIN, FLUID (OTHER): Albumin, Fluid: <1 g/dL

## 2014-04-07 LAB — PREGNANCY, URINE: Preg Test, Ur: NEGATIVE

## 2014-04-07 MED ORDER — ONDANSETRON HCL 4 MG/2ML IJ SOLN
4.0000 mg | Freq: Once | INTRAMUSCULAR | Status: AC
  Administered 2014-04-07: 4 mg via INTRAVENOUS
  Filled 2014-04-07: qty 2

## 2014-04-07 MED ORDER — FENTANYL CITRATE 0.05 MG/ML IJ SOLN
50.0000 ug | Freq: Once | INTRAMUSCULAR | Status: AC
  Administered 2014-04-07: 50 ug via INTRAVENOUS
  Filled 2014-04-07: qty 2

## 2014-04-07 MED ORDER — SODIUM CHLORIDE 0.9 % IV BOLUS (SEPSIS)
1000.0000 mL | Freq: Once | INTRAVENOUS | Status: AC
  Administered 2014-04-07: 1000 mL via INTRAVENOUS

## 2014-04-07 MED ORDER — MORPHINE SULFATE 4 MG/ML IJ SOLN
4.0000 mg | Freq: Once | INTRAMUSCULAR | Status: DC
  Filled 2014-04-07: qty 1

## 2014-04-07 NOTE — Procedures (Signed)
Ultrasound-guided diagnostic and therapeutic paracentesis performed yielding 3 L of clear, yellow fluid. A portion of the fluid was submitted to the laboratory for preordered studies. No immediate complications.

## 2014-04-07 NOTE — ED Notes (Signed)
Bed: WA21 Expected date:  Expected time:  Means of arrival:  Comments: EMS-abdominal pain 

## 2014-04-07 NOTE — ED Provider Notes (Signed)
CSN: [id number]     Arrival date & time 04/07/14  0448 History   First MD Initiated Contact with Patient 04/07/14 [phone]     Chief Complaint  Patient presents with  . Abdominal Pain     (Consider location/radiation/quality/duration/timing/severity/associated sxs/prior Treatment) HPI   Pt with hx autoimmune hepatitis/primary sclerosis cholangitis overlap (diagnosed at age 24) with esophageal varices s/p banding 03/29/14 at [facility], single remote episode of cholangitis, hx ascites, encephalopathy, bleeding varices.  Presenting several months of abdominal bloating and two days of abdominal pain.  Epigastric pain is constant, suprapubic pain is intermittent, all described as stabbing.  Has had N/V x 1, diarrhea x 1 week.  Diarrhea is light brown.  About 6-7 episodes/day. Denies any blood in emesis or diarrhea.  Took advil for pain yesterday, helped for short period of time.   Denies urinary symptoms, abnormal vaginal discharge or bleeding. LMP 03/30/13.  Also hx blood transfusions due to GI bleed.    Past Medical History  Diagnosis Date  . Liver disease   . Hepatitis   . Anemia   . Esophageal varices    Past Surgical History  Procedure Laterality Date  . Gastric banding port revision    . Tonsillectomy    . Esophagogastroduodenoscopy  07/22/2011    Procedure: ESOPHAGOGASTRODUODENOSCOPY (EGD);  Surgeon: Graylin Shiver, MD;  Location: [facility] ENDOSCOPY;  Service: Endoscopy;  Laterality: N/A;   No family history on file. History  Substance Use Topics  . Smoking status: Never Smoker   . Smokeless tobacco: Never Used  . Alcohol Use: No   OB History    No data available     Review of Systems  All other systems reviewed and are negative.     Allergies  Review of patient's allergies indicates no known allergies.  Home Medications   Prior to Admission medications   Medication Sig Start Date End Date Taking? Authorizing Provider  azaTHIOprine (IMURAN) 50 MG tablet Take 100 mg by mouth daily.     Yes Historical Provider, MD  furosemide (LASIX) 40 MG tablet Take 40-80 mg by mouth daily.    Yes Historical Provider, MD  lactulose (CHRONULAC) 10 GM/15ML solution Take 20 g by mouth 2 (two) times daily as needed (constipation).    Yes Historical Provider, MD  levofloxacin (LEVAQUIN) 750 MG tablet Take 750 mg by mouth once a week. fridays   Yes Historical Provider, MD  omeprazole (PRILOSEC) 40 MG capsule Take 40 mg by mouth daily. 03/29/14  Yes Historical Provider, MD  predniSONE (DELTASONE) 5 MG tablet Take 5 mg by mouth daily.   Yes Historical Provider, MD  propranolol (INDERAL LA) 60 MG 24 hr capsule Take 60 mg by mouth daily.   Yes Historical Provider, MD  spironolactone (ALDACTONE) 25 MG tablet Take 100 mg by mouth daily.    Yes Historical Provider, MD  ursodiol (ACTIGALL) 300 MG capsule Take 600 mg by mouth daily.    Yes Historical Provider, MD  Vitamin D, Ergocalciferol, (DRISDOL) 50000 UNITS CAPS capsule Take 1 capsule by mouth every 7 (seven) days. On Wednesdays 02/25/14 02/25/15 Yes Historical Provider, MD  amoxicillin-clavulanate (AUGMENTIN) 875-125 MG per tablet Take 1 tablet by mouth 2 (two) times daily. Patient not taking: Reported on 04/07/2014 01/16/14   Henderson Cloud, MD  ondansetron (ZOFRAN) 4 MG tablet Take 1 tablet (4 mg total) by mouth every 6 (six) hours as needed for nausea. Patient not taking: Reported on 04/07/2014 01/16/14   Henderson Cloud,  MD  oxyCODONE (OXY IR/ROXICODONE) 5 MG immediate release tablet Take 1-2 tablets (5-10 mg total) by mouth every 4 (four) hours as needed for moderate pain. Patient not taking: Reported on 04/07/2014 01/16/14   Henderson Cloud, MD   BP 110/53 mmHg  Pulse 80  Temp(Src) 98.5 F (36.9 C) (Oral)  Resp 25  SpO2 99%  LMP 03/30/2014 Physical Exam  Constitutional: She appears well-developed and well-nourished. No distress.  HENT:  Head: Normocephalic and atraumatic.  Neck: Neck supple.  Cardiovascular:  Normal rate and regular rhythm.   Pulmonary/Chest: Effort normal and breath sounds normal. No respiratory distress. She has no wheezes. She has no rales.  Abdominal: Soft. She exhibits distension. She exhibits no mass. There is generalized tenderness. There is no rebound and no guarding.  Neurological: She is alert.  Skin: She is not diaphoretic.  Nursing note and vitals reviewed.   ED Course  Procedures (including critical care time) Labs Review Labs Reviewed  CBC WITH DIFFERENTIAL/PLATELET - Abnormal; Notable for the following:    RBC 2.76 (*)    Hemoglobin 8.1 (*)    HCT 26.3 (*)    RDW 19.1 (*)    Platelets 76 (*)    Lymphocytes Relative 10 (*)    Lymphs Abs 0.4 (*)    All other components within normal limits  COMPREHENSIVE METABOLIC PANEL - Abnormal; Notable for the following:    Calcium 8.1 (*)    Albumin 2.8 (*)    AST 61 (*)    ALT 58 (*)    Alkaline Phosphatase 427 (*)    Total Bilirubin 2.9 (*)    All other components within normal limits  URINALYSIS, ROUTINE W REFLEX MICROSCOPIC - Abnormal; Notable for the following:    Color, Urine AMBER (*)    APPearance CLOUDY (*)    Bilirubin Urine SMALL (*)    Urobilinogen, UA 2.0 (*)    Leukocytes, UA SMALL (*)    All other components within normal limits  URINE MICROSCOPIC-ADD ON - Abnormal; Notable for the following:    Squamous Epithelial / LPF MANY (*)    Bacteria, UA MANY (*)    All other components within normal limits  LACTATE DEHYDROGENASE, BODY FLUID - Abnormal; Notable for the following:    LD, Fluid 24 (*)    All other components within normal limits  POC OCCULT BLOOD, ED - Abnormal; Notable for the following:    Fecal Occult Bld POSITIVE (*)    All other components within normal limits  BODY FLUID CULTURE  BODY FLUID CULTURE  BODY FLUID CULTURE  BODY FLUID CULTURE  LIPASE, BLOOD  PREGNANCY, URINE  ALBUMIN, FLUID  PROTEIN, BODY FLUID  OCCULT BLOOD X 1 CARD TO LAB, STOOL  ALBUMIN, FLUID  GLUCOSE,  PERITONEAL FLUID  LACTATE DEHYDROGENASE, BODY FLUID  PROTEIN, BODY FLUID  ALBUMIN, FLUID  GLUCOSE, SEROUS FLUID  LACTATE DEHYDROGENASE, BODY FLUID  PROTEIN, BODY FLUID  ALBUMIN, FLUID  LACTATE DEHYDROGENASE, BODY FLUID  PROTEIN, BODY FLUID  GLUCOSE, SEROUS FLUID  I-STAT CG4 LACTIC ACID, ED  I-STAT CG4 LACTIC ACID, ED    Imaging Review US Paracentesis  04/07/2014   INDICATION: Cirrhosis, autoimmune hepatitis with PSC overlap syndrome, splenomegaly, recurrent ascites. Request is made for diagnostic and therapeutic paracentesis.  EXAM: ULTRASOUND-GUIDED DIAGNOSTIC AND THERAPEUTIC PARACENTESIS  COMPARISON:  Prior paracentesis on 01/13/2014  MEDICATIONS: None.  COMPLICATIONS: None immediate  TECHNIQUE: Informed written consent was obtained from the patient after a discussion of the risks, benefits and  alternatives to treatment. A timeout was performed prior to the initiation of the procedure.  Initial ultrasound scanning demonstrates a moderate amount of ascites within the right lower abdominal quadrant. The right lower abdomen was prepped and draped in the usual sterile fashion. 1% lidocaine was used for local anesthesia. Under direct ultrasound guidance, a 19 gauge, 10-cm, Yueh catheter was introduced. An ultrasound image was saved for documentation purposed. The paracentesis was performed. The catheter was removed and a dressing was applied. The patient tolerated the procedure well without immediate post procedural complication.  FINDINGS: A total of approximately 3 liters of clear, yellow fluid was removed. Samples were sent to the laboratory as requested by the clinical team.  IMPRESSION: Successful ultrasound-guided diagnostic and therapeutic paracentesis yielding 3 liters of peritoneal fluid.  Read by: Jeananne RamaKevin Allred, PA-C   Electronically Signed   By: Irish LackGlenn  Yamagata M.D.   On: 04/07/2014 10:23     EKG Interpretation None       8:00 AM Discussed pt with Dr Blinda LeatherwoodPollina who will also see and  examine the patient.    10:42 AM Pt reports feeling much better after paracentesis, requesting food.   2:31 PM Pt continues to feel well, states she is hungry.  Mild soreness of abdomen but much improved since paracentesis    MDM   Final diagnoses:  Generalized abdominal pain  Ascites   Pt with hx hepatitis/liver disease with hx esophageal varices p/w abdominal bloating and pain, N/Vx1, 1 week of diarrhea.  Hgb is 8.1, approximately baseline for her.  Hemoccult positive.  LFTs are elevated, somewhat changed but chronically elevated.  Pt also seen and examined by Dr Blinda LeatherwoodPollina.  Paracentesis proved to take away patient's pain, 3L removed.  Fluid analyses essentially normal.  Pt hungry and somewhat sore from paracentesis, but significant abdominal pain relieved.  Reexamination benign.  D/C home with close PCP follow up. Discussed result, findings, treatment, and follow up  with patient.  Pt given return precautions.  Pt verbalizes understanding and agrees with plan.       Trixie Dredgemily Ellenora Talton, PA-C 04/07/14 1502  Gilda Creasehristopher J Pollina, MD 04/12/14 1105

## 2014-04-07 NOTE — Discharge Instructions (Signed)
Read the information below.  You may return to the Emergency Department at any time for worsening condition or any new symptoms that concern you.  If you develop high fevers, worsening abdominal pain, uncontrolled vomiting, or are unable to tolerate fluids by mouth, return to the ER for a recheck.    Abdominal Pain Many things can cause abdominal pain. Usually, abdominal pain is not caused by a disease and will improve without treatment. It can often be observed and treated at home. Your health care provider will do a physical exam and possibly order blood tests and X-rays to help determine the seriousness of your pain. However, in many cases, more time must pass before a clear cause of the pain can be found. Before that point, your health care provider may not know if you need more testing or further treatment. HOME CARE INSTRUCTIONS  Monitor your abdominal pain for any changes. The following actions may help to alleviate any discomfort you are experiencing:  Only take over-the-counter or prescription medicines as directed by your health care provider.  Do not take laxatives unless directed to do so by your health care provider.  Try a clear liquid diet (broth, tea, or water) as directed by your health care provider. Slowly move to a bland diet as tolerated. SEEK MEDICAL CARE IF:  You have unexplained abdominal pain.  You have abdominal pain associated with nausea or diarrhea.  You have pain when you urinate or have a bowel movement.  You experience abdominal pain that wakes you in the night.  You have abdominal pain that is worsened or improved by eating food.  You have abdominal pain that is worsened with eating fatty foods.  You have a fever. SEEK IMMEDIATE MEDICAL CARE IF:   Your pain does not go away within 2 hours.  You keep throwing up (vomiting).  Your pain is felt only in portions of the abdomen, such as the right side or the left lower portion of the abdomen.  You pass  bloody or black tarry stools. MAKE SURE YOU:  Understand these instructions.   Will watch your condition.   Will get help right away if you are not doing well or get worse.  Document Released: 10/24/2004 Document Revised: 01/19/2013 Document Reviewed: 09/23/2012 [facility] Patient Information 2015 [place], [place]. This information is not intended to replace advice given to you by your health care provider. Make sure you discuss any questions you have with your health care provider.  Ascites Ascites is a gathering of fluid in the belly (abdomen). This is most often caused by liver disease. It may also be caused by a number of other less common problems. It causes a ballooning out (distension) of the abdomen. CAUSES  Scarring of the liver (cirrhosis) is the most common cause of ascites. Other causes include:  Infection or inflammation in the abdomen.  Cancer in the abdomen.  Heart failure.  Certain forms of kidney failure (nephritic syndrome).  Inflammation of the pancreas.  Clots in the veins of the liver. SYMPTOMS  In the early stages of ascites, you may not have any symptoms. The main symptom of ascites is a sense of abdominal bloating. This is due to the presence of fluid. This may also cause an increase in abdominal or waist size. People with this condition can develop swelling in the legs, and men can develop a swollen scrotum. When there is a lot of fluid, it may be hard to breath. Stretching of the abdomen by fluid can  be painful. DIAGNOSIS  Certain features of your medical history, such as a history of liver disease and of an enlarging abdomen, can suggest the presence of ascites. The diagnosis of ascites can be made on physical exam by your caregiver. An abdominal ultrasound examination can confirm that ascites is present, and estimate the amount of fluid. Once ascites is confirmed, it is important to determine its cause. Again, a history of one of the conditions listed in  "CAUSES" provides a strong clue. A physical exam is important, and blood and X-ray tests may be needed. During a procedure called paracentesis, a sample of fluid is removed from the abdomen. This can determine certain key features about the fluid, such as whether or not infection or cancer is present. Your caregiver will determine if a paracentesis is necessary. They will describe the procedure to you. PREVENTION  Ascites is a complication of other conditions. Therefore to prevent ascites, you must seek treatment for any significant health conditions you have. Once ascites is present, careful attention to fluid and salt intake may help prevent it from getting worse. If you have ascites, you should not drink alcohol. PROGNOSIS  The prognosis of ascites depends on the underlying disease. If the disease is reversible, such as with certain infections or with heart failure, then ascites may improve or disappear. When ascites is caused by cirrhosis, then it indicates that the liver disease has worsened, and further evaluation and treatment of the liver disease is needed. If your ascites is caused by cancer, then the success or failure of the cancer treatment will determine whether your ascites will improve or worsen. RISKS AND COMPLICATIONS  Ascites is likely to worsen if it is not properly diagnosed and treated. A large amount of ascites can cause pain and difficulty breathing. The main complication, besides worsening, is infection (called spontaneous bacterial peritonitis). This requires prompt treatment. TREATMENT  The treatment of ascites depends on its cause. When liver disease is your cause, medical management using water pills (diuretics) and decreasing salt intake is often effective. Ascites due to peritoneal inflammation or malignancy (cancer) alone does not respond to salt restriction and diuretics. Hospitalization is sometimes required. If the treatment of ascites cannot be managed with medications, a  number of other treatments are available. Your caregivers will help you decide which will work best for you. Some of these are:  Removal of fluid from the abdomen (paracentesis).  Fluid from the abdomen is passed into a vein (peritoneovenous shunting).  Liver transplantation.  Transjugular intrahepatic portosystemic stent shunt. HOME CARE INSTRUCTIONS  It is important to monitor body weight and the intake and output of fluids. Weigh yourself at the same time every day. Record your weights. Fluid restriction may be necessary. It is also important to know your salt intake. The more salt you take in, the more fluid you will retain. Ninety percent of people with ascites respond to this approach.  Follow any directions for medicines carefully.  Follow up with your caregiver, as directed.  Report any changes in your health, especially any new or worsening symptoms.  If your ascites is from liver disease, avoid alcohol and other substances toxic to the liver. SEEK MEDICAL CARE IF:   Your weight increases more than a few pounds in a few days.  Your abdominal or waist size increases.  You develop swelling in your legs.  You had swelling and it worsens. SEEK IMMEDIATE MEDICAL CARE IF:   You develop a fever.  You develop new abdominal  pain.  You develop difficulty breathing.  You develop confusion.  You have bleeding from the mouth, stomach, or rectum. MAKE SURE YOU:   Understand these instructions.  Will watch your condition.  Will get help right away if you are not doing well or get worse. Document Released: 01/14/2005 Document Revised: 04/08/2011 Document Reviewed: 08/15/2006 [facility] Patient Information 2015 [place]. This information is not intended to replace advice given to you by your health care provider. Make sure you discuss any questions you have with your health care provider.

## 2014-04-07 NOTE — ED Notes (Signed)
Patient presents from home via EMS for abdominal pain x2 hours, abdominal distention x2 days. Patient reports "liver dx". Reports vomiting x1, diarrhea x1.  VS: 110/60, 84HR, 18Resp

## 2014-04-07 NOTE — ED Notes (Addendum)
Peritoneal fluid withdrawal site with band aid. No fluid leaking.

## 2014-04-07 NOTE — ED Notes (Signed)
Patient transported to Ultrasound 

## 2014-04-10 LAB — BODY FLUID CULTURE: CULTURE: NO GROWTH

## 2014-05-27 ENCOUNTER — Inpatient Hospital Stay (HOSPITAL_COMMUNITY)
Admission: EM | Admit: 2014-05-27 | Discharge: 2014-05-29 | DRG: 871 | Disposition: A | Discharge status: Home / Self Care | Payer: BC Managed Care – PPO | Attending: Family Medicine | Admitting: Family Medicine

## 2014-05-27 ENCOUNTER — Encounter (HOSPITAL_COMMUNITY): Payer: Self-pay | Admitting: Emergency Medicine

## 2014-05-27 ENCOUNTER — Emergency Department (HOSPITAL_COMMUNITY): Payer: BC Managed Care – PPO

## 2014-05-27 DIAGNOSIS — K5900 Constipation, unspecified: Secondary | ICD-10-CM | POA: Diagnosis present

## 2014-05-27 DIAGNOSIS — R1084 Generalized abdominal pain: Secondary | ICD-10-CM | POA: Diagnosis not present

## 2014-05-27 DIAGNOSIS — R188 Other ascites: Secondary | ICD-10-CM | POA: Diagnosis present

## 2014-05-27 DIAGNOSIS — Z7952 Long term (current) use of systemic steroids: Secondary | ICD-10-CM | POA: Diagnosis not present

## 2014-05-27 DIAGNOSIS — R5081 Fever presenting with conditions classified elsewhere: Secondary | ICD-10-CM | POA: Diagnosis present

## 2014-05-27 DIAGNOSIS — K754 Autoimmune hepatitis: Secondary | ICD-10-CM | POA: Diagnosis present

## 2014-05-27 DIAGNOSIS — K2971 Gastritis, unspecified, with bleeding: Secondary | ICD-10-CM | POA: Diagnosis not present

## 2014-05-27 DIAGNOSIS — K429 Umbilical hernia without obstruction or gangrene: Secondary | ICD-10-CM | POA: Diagnosis present

## 2014-05-27 DIAGNOSIS — K652 Spontaneous bacterial peritonitis: Secondary | ICD-10-CM | POA: Diagnosis present

## 2014-05-27 DIAGNOSIS — E876 Hypokalemia: Secondary | ICD-10-CM | POA: Diagnosis present

## 2014-05-27 DIAGNOSIS — R109 Unspecified abdominal pain: Secondary | ICD-10-CM | POA: Diagnosis present

## 2014-05-27 DIAGNOSIS — K830 Cholangitis: Secondary | ICD-10-CM | POA: Diagnosis present

## 2014-05-27 DIAGNOSIS — K7460 Unspecified cirrhosis of liver: Secondary | ICD-10-CM | POA: Diagnosis present

## 2014-05-27 DIAGNOSIS — K922 Gastrointestinal hemorrhage, unspecified: Secondary | ICD-10-CM | POA: Diagnosis present

## 2014-05-27 DIAGNOSIS — D72819 Decreased white blood cell count, unspecified: Secondary | ICD-10-CM

## 2014-05-27 DIAGNOSIS — D649 Anemia, unspecified: Secondary | ICD-10-CM | POA: Diagnosis present

## 2014-05-27 DIAGNOSIS — K766 Portal hypertension: Secondary | ICD-10-CM | POA: Diagnosis present

## 2014-05-27 DIAGNOSIS — D638 Anemia in other chronic diseases classified elsewhere: Secondary | ICD-10-CM | POA: Diagnosis present

## 2014-05-27 DIAGNOSIS — A419 Sepsis, unspecified organism: Principal | ICD-10-CM

## 2014-05-27 DIAGNOSIS — Z79899 Other long term (current) drug therapy: Secondary | ICD-10-CM

## 2014-05-27 DIAGNOSIS — D709 Neutropenia, unspecified: Secondary | ICD-10-CM | POA: Insufficient documentation

## 2014-05-27 DIAGNOSIS — D61818 Other pancytopenia: Secondary | ICD-10-CM | POA: Diagnosis not present

## 2014-05-27 LAB — CBC WITH DIFFERENTIAL/PLATELET
BASOS ABS: 0 10*3/uL (ref 0.0–0.1)
BASOS PCT: 2 % — AB (ref 0–1)
EOS ABS: 0 10*3/uL (ref 0.0–0.7)
Eosinophils Relative: 1 % (ref 0–5)
HCT: 26.2 % — ABNORMAL LOW (ref 36.0–46.0)
HEMOGLOBIN: 8.5 g/dL — AB (ref 12.0–15.0)
LYMPHS ABS: 0.4 10*3/uL — AB (ref 0.7–4.0)
Lymphocytes Relative: 28 % (ref 12–46)
MCH: 29.4 pg (ref 26.0–34.0)
MCHC: 32.4 g/dL (ref 30.0–36.0)
MCV: 90.7 fL (ref 78.0–100.0)
Monocytes Absolute: 0.3 10*3/uL (ref 0.1–1.0)
Monocytes Relative: 20 % — ABNORMAL HIGH (ref 3–12)
NEUTROS PCT: 49 % (ref 43–77)
Neutro Abs: 0.6 10*3/uL — ABNORMAL LOW (ref 1.7–7.7)
Platelets: 73 10*3/uL — ABNORMAL LOW (ref 150–400)
RBC: 2.89 MIL/uL — AB (ref 3.87–5.11)
RDW: 18.7 % — AB (ref 11.5–15.5)
WBC: 1.3 10*3/uL — AB (ref 4.0–10.5)

## 2014-05-27 LAB — URINALYSIS, ROUTINE W REFLEX MICROSCOPIC
Glucose, UA: NEGATIVE mg/dL
Hgb urine dipstick: NEGATIVE
Ketones, ur: NEGATIVE mg/dL
LEUKOCYTES UA: NEGATIVE
NITRITE: NEGATIVE
Protein, ur: NEGATIVE mg/dL
Specific Gravity, Urine: 1.012 (ref 1.005–1.030)
UROBILINOGEN UA: 4 mg/dL — AB (ref 0.0–1.0)
pH: 6 (ref 5.0–8.0)

## 2014-05-27 LAB — PATHOLOGIST SMEAR REVIEW

## 2014-05-27 LAB — COMPREHENSIVE METABOLIC PANEL
ALBUMIN: 2.5 g/dL — AB (ref 3.5–5.2)
ALK PHOS: 403 U/L — AB (ref 39–117)
ALT: 36 U/L — AB (ref 0–35)
AST: 78 U/L — ABNORMAL HIGH (ref 0–37)
Anion gap: 7 (ref 5–15)
BILIRUBIN TOTAL: 4.2 mg/dL — AB (ref 0.3–1.2)
CO2: 30 mmol/L (ref 19–32)
Calcium: 7.2 mg/dL — ABNORMAL LOW (ref 8.4–10.5)
Chloride: 95 mmol/L — ABNORMAL LOW (ref 96–112)
Creatinine, Ser: 0.81 mg/dL (ref 0.50–1.10)
Glucose, Bld: 95 mg/dL (ref 70–99)
Potassium: 2.6 mmol/L — CL (ref 3.5–5.1)
Sodium: 132 mmol/L — ABNORMAL LOW (ref 135–145)
TOTAL PROTEIN: 5.8 g/dL — AB (ref 6.0–8.3)

## 2014-05-27 LAB — GLUCOSE, PERITONEAL FLUID: Glucose, Peritoneal Fluid: 98 mg/dL

## 2014-05-27 LAB — CBC
HCT: 20.9 % — ABNORMAL LOW (ref 36.0–46.0)
Hemoglobin: 6.7 g/dL — CL (ref 12.0–15.0)
MCH: 29.1 pg (ref 26.0–34.0)
MCHC: 32.1 g/dL (ref 30.0–36.0)
MCV: 90.9 fL (ref 78.0–100.0)
PLATELETS: 40 10*3/uL — AB (ref 150–400)
RBC: 2.3 MIL/uL — ABNORMAL LOW (ref 3.87–5.11)
RDW: 19 % — ABNORMAL HIGH (ref 11.5–15.5)
WBC: 1.1 10*3/uL — CL (ref 4.0–10.5)

## 2014-05-27 LAB — BODY FLUID CELL COUNT WITH DIFFERENTIAL
LYMPHS FL: 16 %
Monocyte-Macrophage-Serous Fluid: 83 % (ref 50–90)
NEUTROPHIL FLUID: 1 % (ref 0–25)
WBC FLUID: 20 uL (ref 0–1000)

## 2014-05-27 LAB — LACTATE DEHYDROGENASE, PLEURAL OR PERITONEAL FLUID: LD FL: 21 U/L (ref 3–23)

## 2014-05-27 LAB — PROTEIN, BODY FLUID: Total protein, fluid: <3 g/dL

## 2014-05-27 LAB — AMMONIA: Ammonia: 60 umol/L — ABNORMAL HIGH (ref 11–32)

## 2014-05-27 LAB — PROTIME-INR
INR: 1.75 — ABNORMAL HIGH (ref 0.00–1.49)
PROTHROMBIN TIME: 20.6 s — AB (ref 11.6–15.2)

## 2014-05-27 LAB — PREGNANCY, URINE: Preg Test, Ur: NEGATIVE

## 2014-05-27 LAB — ALBUMIN, FLUID (OTHER)

## 2014-05-27 LAB — MAGNESIUM: MAGNESIUM: 1.3 mg/dL — AB (ref 1.5–2.5)

## 2014-05-27 LAB — LACTIC ACID, PLASMA: Lactic Acid, Venous: 1.7 mmol/L (ref 0.5–2.0)

## 2014-05-27 LAB — I-STAT CG4 LACTIC ACID, ED: Lactic Acid, Venous: 0.96 mmol/L (ref 0.5–2.0)

## 2014-05-27 MED ORDER — FENTANYL CITRATE (PF) 100 MCG/2ML IJ SOLN
100.0000 ug | Freq: Once | INTRAMUSCULAR | Status: AC
  Administered 2014-05-27: 100 ug via INTRAVENOUS
  Filled 2014-05-27: qty 2

## 2014-05-27 MED ORDER — ONDANSETRON HCL 4 MG PO TABS: 4.0000 mg | ORAL_TABLET | Freq: Four times a day (QID) | ORAL | Status: DC | PRN

## 2014-05-27 MED ORDER — ALBUMIN HUMAN 25 % IV SOLN
12.5000 g | Freq: Once | INTRAVENOUS | Status: AC
  Administered 2014-05-27: 12.5 g via INTRAVENOUS
  Filled 2014-05-27: qty 50

## 2014-05-27 MED ORDER — SODIUM CHLORIDE 0.9 % IV SOLN
Freq: Once | INTRAVENOUS | Status: AC
  Administered 2014-05-27: 23:00:00 via INTRAVENOUS

## 2014-05-27 MED ORDER — ONDANSETRON HCL 4 MG/2ML IJ SOLN
4.0000 mg | Freq: Four times a day (QID) | INTRAMUSCULAR | Status: DC | PRN
  Administered 2014-05-27: 4 mg via INTRAVENOUS
  Filled 2014-05-27: qty 2

## 2014-05-27 MED ORDER — URSODIOL 300 MG PO CAPS
600.0000 mg | ORAL_CAPSULE | Freq: Every day | ORAL | Status: DC
  Administered 2014-05-27 – 2014-05-29 (×3): 600 mg via ORAL
  Filled 2014-05-27 (×4): qty 2

## 2014-05-27 MED ORDER — SODIUM CHLORIDE 0.9 % IJ SOLN
3.0000 mL | Freq: Two times a day (BID) | INTRAMUSCULAR | Status: DC
  Administered 2014-05-27 – 2014-05-29 (×4): 3 mL via INTRAVENOUS

## 2014-05-27 MED ORDER — DEXTROSE 5 % IV SOLN
1.0000 g | Freq: Once | INTRAVENOUS | Status: AC
  Administered 2014-05-27: 1 g via INTRAVENOUS
  Filled 2014-05-27: qty 10

## 2014-05-27 MED ORDER — OXYCODONE HCL 5 MG PO TABS
5.0000 mg | ORAL_TABLET | ORAL | Status: DC | PRN
  Administered 2014-05-27 – 2014-05-29 (×3): 5 mg via ORAL
  Filled 2014-05-27 (×4): qty 1

## 2014-05-27 MED ORDER — LACTULOSE 10 GM/15ML PO SOLN
20.0000 g | Freq: Two times a day (BID) | ORAL | Status: DC | PRN
  Filled 2014-05-27: qty 30

## 2014-05-27 MED ORDER — ACETAMINOPHEN 500 MG PO TABS
500.0000 mg | ORAL_TABLET | Freq: Four times a day (QID) | ORAL | Status: DC | PRN
  Administered 2014-05-27: 500 mg via ORAL
  Filled 2014-05-27: qty 1

## 2014-05-27 MED ORDER — POTASSIUM CHLORIDE CRYS ER 20 MEQ PO TBCR: 40.0000 meq | EXTENDED_RELEASE_TABLET | Freq: Once | ORAL | Status: DC

## 2014-05-27 MED ORDER — MORPHINE SULFATE 2 MG/ML IJ SOLN
2.0000 mg | INTRAMUSCULAR | Status: DC | PRN
  Administered 2014-05-27: 2 mg via INTRAVENOUS

## 2014-05-27 MED ORDER — POTASSIUM CHLORIDE 20 MEQ/15ML (10%) PO SOLN
40.0000 meq | Freq: Once | ORAL | Status: AC
  Administered 2014-05-27: 40 meq via ORAL
  Filled 2014-05-27: qty 30

## 2014-05-27 MED ORDER — ALBUMIN HUMAN 25 % IV SOLN
50.0000 g | Freq: Once | INTRAVENOUS | Status: AC
  Administered 2014-05-27: 50 g via INTRAVENOUS
  Filled 2014-05-27: qty 200

## 2014-05-27 MED ORDER — CEFTRIAXONE SODIUM IN DEXTROSE 40 MG/ML IV SOLN
2.0000 g | INTRAVENOUS | Status: DC
  Filled 2014-05-27: qty 50

## 2014-05-27 MED ORDER — MAGNESIUM SULFATE 2 GM/50ML IV SOLN
2.0000 g | Freq: Once | INTRAVENOUS | Status: AC
  Administered 2014-05-27: 2 g via INTRAVENOUS
  Filled 2014-05-27: qty 50

## 2014-05-27 MED ORDER — POTASSIUM CHLORIDE 10 MEQ/100ML IV SOLN
10.0000 meq | INTRAVENOUS | Status: AC
  Administered 2014-05-27 (×4): 10 meq via INTRAVENOUS
  Filled 2014-05-27 (×5): qty 100

## 2014-05-27 MED ORDER — MORPHINE SULFATE 2 MG/ML IJ SOLN
2.0000 mg | INTRAMUSCULAR | Status: DC | PRN
Start: 2014-05-27 — End: 2014-05-29
  Administered 2014-05-27 – 2014-05-29 (×7): 2 mg via INTRAVENOUS
  Filled 2014-05-27 (×7): qty 1

## 2014-05-27 MED ORDER — OXYCODONE HCL 5 MG PO TABS
15.0000 mg | ORAL_TABLET | Freq: Once | ORAL | Status: AC
  Administered 2014-05-27: 15 mg via ORAL
  Filled 2014-05-27: qty 3

## 2014-05-27 MED ORDER — MORPHINE SULFATE 2 MG/ML IJ SOLN
INTRAMUSCULAR | Status: AC
  Filled 2014-05-27: qty 1

## 2014-05-27 MED ORDER — CEFTRIAXONE SODIUM IN DEXTROSE 20 MG/ML IV SOLN: 1.0000 g | INTRAVENOUS | Status: DC

## 2014-05-27 MED ORDER — PREDNISONE 5 MG PO TABS
5.0000 mg | ORAL_TABLET | Freq: Every day | ORAL | Status: DC
  Filled 2014-05-27: qty 1

## 2014-05-27 NOTE — ED Notes (Signed)
Pt placed on neutropenic precautions

## 2014-05-27 NOTE — ED Notes (Signed)
Dr Madilyn Hookees sts that she wants sepsis orders but pt is not being tagged as septic. Also adds no fluids needed for this pt

## 2014-05-27 NOTE — ED Notes (Signed)
Patient transported to Ultrasound 

## 2014-05-27 NOTE — H&P (Signed)
History and Physical  Melanie Conley:[id number] DOB: [birth year] DOA: 05/27/2014   PCP: Woodfin Ganja, MD   Chief Complaint: abdominal pain  HPI:  24 y.o. female with a history of autoimmune hepatitis with PSC overlap syndrome followed by [facility]. She is s/p multiple EGDs with esophageal banding. She was most recently discharged from [facility] on 01/16/2014 after an episode of SBP. She was discharged with Augmentin at that time. Most recently, she saw her hepatologist on 05/20/2014 when an EGD was performed. She was found to have one column of grade 2 esophageal varices that were banded. The patient is a poor historian, but she states that she has been compliant with all her medications except for her lactulose. She states that she only takes the lactulose 2 times per week. She has been complaining of some constipation. She had a bowel movement one day prior to this admission. She has had some subjective fevers and chills.  She denies any nausea, vomiting, diarrhea, dysuria, hematuria. She denies any hematochezia, although, hematemesis.  The patient underwent paracentesis in the emergency department removing 4L.  WBC from ascites was 20.  The patient was afebrile and hemodynamically stable, but was not tachycardic with a heart rate in the low 100s. She stated that her abdominal pain was 50% better after paracentesis. She also stated her shortness of breath improved after paracentesis. The patient was given fentanyl 300 g total and given 1 g of ceftriaxone.  Assessment/Plan: spontaneous bacterial peritonitis -Although the patient only has 20 WBCs in the ascites, she may still have peritonitis in the setting of neutropenia  -Continue ceftriaxone pending culture data  -Pain control  -4 L removed by IR  -Give 50 g albumin IV Leukopenia  -May be a combination of the patient's Bactrim and azathioprine  -I discussed with pt's hepatologist, Dr. Chip Boer, at [facility]-->hold Imuran -Send ascites for routine  bacterial culture, AFB, and fungal culture  -Blood cultures and urine cultures have also been sent Hypokalemia  -Replete  -pt receive 40 mEq in ED -will give an additional 40 Hypomagnesemia  -Replete   -recheck in am Autoimmune hepatitis with primary sclerosing cholangitis -Continue Actigall -Continue prednisone Ascites -Hold Aldactone and furosemide for today and reevaluate on 05/28/2014 -Patient's actual furosemide dose is 60 mg daily and Aldactone is 100 mg daily esophageal varices/portal hypertension -Hold Inderal secondary to blood pressure -monitor Hgb       Past Medical History  Diagnosis Date  . Liver disease   . Hepatitis   . Anemia   . Esophageal varices    Past Surgical History  Procedure Laterality Date  . Gastric banding port revision    . Tonsillectomy    . Esophagogastroduodenoscopy  07/22/2011    Procedure: ESOPHAGOGASTRODUODENOSCOPY (EGD);  Surgeon: Graylin Shiver, MD;  Location: [facility] ENDOSCOPY;  Service: Endoscopy;  Laterality: N/A;   Social History:  reports that she has never smoked. She has never used smokeless tobacco. She reports that she does not drink alcohol or use illicit drugs.   No family history on file.   No Known Allergies    Prior to Admission medications   Medication Sig Start Date End Date Taking? Authorizing Provider  azaTHIOprine (IMURAN) 50 MG tablet Take 100 mg by mouth daily.    Yes Historical Provider, MD  furosemide (LASIX) 40 MG tablet Take 40-80 mg by mouth daily.    Yes Historical Provider, MD  lactulose (CHRONULAC) 10 GM/15ML solution Take 20 g by mouth 2 (two)  times daily as needed (constipation).    Yes Historical Provider, MD  levofloxacin (LEVAQUIN) 750 MG tablet Take 750 mg by mouth once a week.    Yes Historical Provider, MD  predniSONE (DELTASONE) 5 MG tablet Take 5 mg by mouth daily.   Yes Historical Provider, MD  propranolol (INDERAL LA) 60 MG 24 hr capsule Take 60 mg by mouth daily.   Yes Historical Provider,  MD  spironolactone (ALDACTONE) 25 MG tablet Take 100 mg by mouth daily.    Yes Historical Provider, MD  ursodiol (ACTIGALL) 300 MG capsule Take 600 mg by mouth daily.    Yes Historical Provider, MD  Vitamin D, Ergocalciferol, (DRISDOL) 50000 UNITS CAPS capsule Take 1 capsule by mouth every 7 (seven) days. On Wednesdays 02/25/14 02/25/15 Yes Historical Provider, MD  amoxicillin-clavulanate (AUGMENTIN) 875-125 MG per tablet Take 1 tablet by mouth 2 (two) times daily. Patient not taking: Reported on 04/07/2014 01/16/14   Henderson CloudEstela Y Hernandez Acosta, MD  ondansetron (ZOFRAN) 4 MG tablet Take 1 tablet (4 mg total) by mouth every 6 (six) hours as needed for nausea. Patient not taking: Reported on 04/07/2014 01/16/14   Henderson CloudEstela Y Hernandez Acosta, MD  oxyCODONE (OXY IR/ROXICODONE) 5 MG immediate release tablet Take 1-2 tablets (5-10 mg total) by mouth every 4 (four) hours as needed for moderate pain. Patient not taking: Reported on 04/07/2014 01/16/14   Henderson CloudEstela Y Hernandez Acosta, MD    Review of Systems:  Constitutional:  No weight loss, night sweats, Head&Eyes: No headache.  No vision loss.  No eye pain or scotoma ENT:  No Difficulty swallowing,Tooth/dental problems,Sore throat,   Cardio-vascular:  No chest pain, Orthopnea, PND, swelling in lower extremities,  dizziness, palpitations  GI:  No  abdominal pain, nausea, vomiting, diarrhea, loss of appetite, hematochezia, melena, heartburn, indigestion, Resp:  No shortness of breath with exertion or at rest. No cough. No coughing up of blood . Skin:  no rash or lesions.  GU:  no dysuria, change in color of urine, no urgency or frequency. No flank pain.  Musculoskeletal:  No joint pain or swelling. No decreased range of motion. No back pain.  Psych:  No change in mood or affect. Neurologic: No headache, no dysesthesia, no focal weakness, no vision loss. No syncope  Physical Exam: Filed Vitals:   05/27/14 1125 05/27/14 1132 05/27/14 1222 05/27/14  1403  BP: 108/65 116/71  113/86  Pulse:    97  Temp:   98.8 F (37.1 C)   TempSrc:   Oral   Resp:    18  Height:      Weight:      SpO2:    93%   General:  A&O x 3, NAD, nontoxic, pleasant/cooperative Head/Eye: No conjunctival hemorrhage, no icterus, [place]/AT, No nystagmus ENT:  No icterus,  No thrush,  no pharyngeal exudate Neck:  No masses, no lymphadenpathy, no bruits, no meningismus CV:  RRR, no rub, no gallop, no S3 Lung:  CTAB, good air movement, no wheeze, no rhonchi Abdomen: soft/diffusely tender, +BS, nondistended, no peritoneal signs Ext: No cyanosis, No rashes, No petechiae, No lymphangitis, trace LE edema   Labs on Admission:  Basic Metabolic Panel:  Recent Labs Lab 05/27/14 1013  NA 132*  K 2.6*  CL 95*  CO2 30  GLUCOSE 95  BUN <5*  CREATININE 0.81  CALCIUM 7.2*  MG 1.3*   Liver Function Tests:  Recent Labs Lab 05/27/14 1013  AST 78*  ALT 36*  ALKPHOS 403*  BILITOT  4.2*  PROT 5.8*  ALBUMIN 2.5*   No results for input(s): LIPASE, AMYLASE in the last 168 hours.  Recent Labs Lab 05/27/14 1013  AMMONIA 60*   CBC:  Recent Labs Lab 05/27/14 1013  WBC 1.3*  NEUTROABS 0.6*  HGB 8.5*  HCT 26.2*  MCV 90.7  PLT 73*   Cardiac Enzymes: No results for input(s): CKTOTAL, CKMB, CKMBINDEX, TROPONINI in the last 168 hours. BNP: Invalid input(s): POCBNP CBG: No results for input(s): GLUCAP in the last 168 hours.  Radiological Exams on Admission: US Paracentesis  05/27/2014   INDICATION: Cirrhosis, autoimmune hepatitis with PSC overlap syndrome, splenomegaly, recurrent ascites. Request is made for diagnostic and therapeutic paracentesis.  EXAM: ULTRASOUND-GUIDED DIAGNOSTIC AND THERAPEUTIC PARACENTESIS  COMPARISON:  Prior paracentesis on 04/07/14  MEDICATIONS: None.  COMPLICATIONS: None immediate  TECHNIQUE: Informed written consent was obtained from the patient after a discussion of the risks, benefits and alternatives to treatment. A timeout was  performed prior to the initiation of the procedure.  Initial ultrasound scanning demonstrates a moderate to large amount of ascites within the left lower abdominal quadrant. The left lower abdomen was prepped and draped in the usual sterile fashion. 1% lidocaine was used for local anesthesia. Under direct ultrasound guidance, a 19 gauge, 7-cm, Yueh catheter was introduced. An ultrasound image was saved for documentation purposed. The paracentesis was performed. The catheter was removed and a dressing was applied. The patient tolerated the procedure well without immediate post procedural complication.  FINDINGS: A total of approximately 4.3 liters of yellow fluid was removed. Samples were sent to the laboratory as requested by the clinical team.  IMPRESSION: Successful ultrasound-guided diagnostic and therapeutic paracentesis yielding 4.3 liters of peritoneal fluid.  Read by: Jeananne Rama, PA-C   Electronically Signed   By: Judie Petit.  Shick M.D.   On: 05/27/2014 12:09   Dg Chest Port 1 View  05/27/2014   CLINICAL DATA:  Headache. Shortness of breath. Cough and chest pain for 3 days.  EXAM: PORTABLE CHEST - 1 VIEW  COMPARISON:  01/12/2014  FINDINGS: Mild central airway thickening. The lungs appear otherwise clear. Cardiac and mediastinal margins appear normal. No pleural effusion.  IMPRESSION: 1. Airway thickening is present, suggesting bronchitis or reactive airways disease.   Electronically Signed   By: Gaylyn Rong M.D.   On: 05/27/2014 13:17       Time spent:90 minutes Code Status:   FULL Family Communication:   No Family at bedside   Adanna Zuckerman, DO  [facility] Pager [phone]  If 7PM-7AM, please contact night-coverage [email or web address] Password [facility] 05/27/2014, 2:38 PM

## 2014-05-27 NOTE — ED Notes (Signed)
Dr Tat at bedside 

## 2014-05-27 NOTE — ED Notes (Signed)
Bed: [id number] Expected date:  Expected time:  Means of arrival:  Comments: EMS-ascites

## 2014-05-27 NOTE — Procedures (Signed)
Ultrasound-guided diagnostic and therapeutic paracentesis performed yielding 4.3 L yellow fluid. A portion of the fluid was submitted to the laboratory for preordered studies. No immediate complications.

## 2014-05-27 NOTE — ED Provider Notes (Signed)
CSN: [id number]     Arrival date & time 05/27/14  4098 History   First MD Initiated Contact with Patient 05/27/14 0940     Chief Complaint  Patient presents with  . Abdominal Pain  . Headache     Patient is a 24 y.o. female presenting with abdominal pain and headaches. The history is provided by the patient. No language interpreter was used.  Abdominal Pain Headache Associated symptoms: abdominal pain    Melanie Conley presents by EMS for evaluation of abdominal pain.  She reports three days of upper abdominal pain.  The pain is constant and nonradiating.  She cannot characterize the pain but describes it as severe.  She has associated headache, low back pain, nausea.  She denies fevers, vomiting, diarrhea, constipation, dysuria.  She had similar sxs previously.  She has a hx/o liver disease and ascites and had a paracentesis one and a half weeks ago.  Sxs are severe, constant, worsening.  She received 200 mcg fentanyl by EMS prior to ED arrival with improvement in her abdominal pain.    Past Medical History  Diagnosis Date  . Liver disease   . Hepatitis   . Anemia   . Esophageal varices    Past Surgical History  Procedure Laterality Date  . Gastric banding port revision    . Tonsillectomy    . Esophagogastroduodenoscopy  07/22/2011    Procedure: ESOPHAGOGASTRODUODENOSCOPY (EGD);  Surgeon: Graylin Shiver, MD;  Location: [facility] ENDOSCOPY;  Service: Endoscopy;  Laterality: N/A;   No family history on file. History  Substance Use Topics  . Smoking status: Never Smoker   . Smokeless tobacco: Never Used  . Alcohol Use: No   OB History    No data available     Review of Systems  Gastrointestinal: Positive for abdominal pain.  Neurological: Positive for headaches.  All other systems reviewed and are negative.     Allergies  Review of patient's allergies indicates no known allergies.  Home Medications   Prior to Admission medications   Medication Sig Start Date End Date Taking?  Authorizing Provider  amoxicillin-clavulanate (AUGMENTIN) 875-125 MG per tablet Take 1 tablet by mouth 2 (two) times daily. Patient not taking: Reported on 04/07/2014 01/16/14   Henderson Cloud, MD  azaTHIOprine (IMURAN) 50 MG tablet Take 100 mg by mouth daily.     Historical Provider, MD  furosemide (LASIX) 40 MG tablet Take 40-80 mg by mouth daily.     Historical Provider, MD  lactulose (CHRONULAC) 10 GM/15ML solution Take 20 g by mouth 2 (two) times daily as needed (constipation).     Historical Provider, MD  levofloxacin (LEVAQUIN) 750 MG tablet Take 750 mg by mouth once a week. fridays    Historical Provider, MD  omeprazole (PRILOSEC) 40 MG capsule Take 40 mg by mouth daily. 03/29/14   Historical Provider, MD  ondansetron (ZOFRAN) 4 MG tablet Take 1 tablet (4 mg total) by mouth every 6 (six) hours as needed for nausea. Patient not taking: Reported on 04/07/2014 01/16/14   Henderson Cloud, MD  oxyCODONE (OXY IR/ROXICODONE) 5 MG immediate release tablet Take 1-2 tablets (5-10 mg total) by mouth every 4 (four) hours as needed for moderate pain. Patient not taking: Reported on 04/07/2014 01/16/14   Henderson Cloud, MD  predniSONE (DELTASONE) 5 MG tablet Take 5 mg by mouth daily.    Historical Provider, MD  propranolol (INDERAL LA) 60 MG 24 hr capsule Take 60 mg by mouth  daily.    Historical Provider, MD  spironolactone (ALDACTONE) 25 MG tablet Take 100 mg by mouth daily.     Historical Provider, MD  ursodiol (ACTIGALL) 300 MG capsule Take 600 mg by mouth daily.     Historical Provider, MD  Vitamin D, Ergocalciferol, (DRISDOL) 50000 UNITS CAPS capsule Take 1 capsule by mouth every 7 (seven) days. On Wednesdays 02/25/14 02/25/15  Historical Provider, MD   SpO2 94% Physical Exam  Constitutional: She is oriented to person, place, and time. She appears well-developed and well-nourished.  HENT:  Head: Normocephalic and atraumatic.  Cardiovascular: Regular rhythm.    Tachycardic, SEM at LSB  Pulmonary/Chest: Effort normal and breath sounds normal. No respiratory distress.  Abdominal:  Abdominal distension with moderate diffuse tenderness.  Umbilical hernia that is soft and reducible.  No guarding.    Musculoskeletal: She exhibits no tenderness.  1+ pitting edema in BLE  Neurological: She is alert and oriented to person, place, and time.  Skin: Skin is warm and dry.  Psychiatric: She has a normal mood and affect. Her behavior is normal.  Nursing note and vitals reviewed.   ED Course  Procedures (including critical care time) Labs Review Labs Reviewed  CBC WITH DIFFERENTIAL/PLATELET - Abnormal; Notable for the following:    WBC 1.3 (*)    RBC 2.89 (*)    Hemoglobin 8.5 (*)    HCT 26.2 (*)    RDW 18.7 (*)    Platelets 73 (*)    Monocytes Relative 20 (*)    Basophils Relative 2 (*)    Neutro Abs 0.6 (*)    Lymphs Abs 0.4 (*)    All other components within normal limits  COMPREHENSIVE METABOLIC PANEL - Abnormal; Notable for the following:    Sodium 132 (*)    Potassium 2.6 (*)    Chloride 95 (*)    BUN <5 (*)    Calcium 7.2 (*)    Total Protein 5.8 (*)    Albumin 2.5 (*)    AST 78 (*)    ALT 36 (*)    Alkaline Phosphatase 403 (*)    Total Bilirubin 4.2 (*)    All other components within normal limits  URINALYSIS, ROUTINE W REFLEX MICROSCOPIC - Abnormal; Notable for the following:    Color, Urine ORANGE (*)    Bilirubin Urine MODERATE (*)    Urobilinogen, UA 4.0 (*)    All other components within normal limits  AMMONIA - Abnormal; Notable for the following:    Ammonia 60 (*)    All other components within normal limits  MAGNESIUM - Abnormal; Notable for the following:    Magnesium 1.3 (*)    All other components within normal limits  CULTURE, BLOOD (ROUTINE X 2)  CULTURE, BLOOD (ROUTINE X 2)  URINE CULTURE  BODY FLUID CULTURE  BODY FLUID CULTURE  FUNGUS CULTURE W SMEAR  AFB CULTURE WITH SMEAR  PREGNANCY, URINE  GLUCOSE,  PERITONEAL FLUID  BODY FLUID CELL COUNT WITH DIFFERENTIAL  PATHOLOGIST SMEAR REVIEW  PATHOLOGIST SMEAR REVIEW  LACTATE DEHYDROGENASE, BODY FLUID  GLUCOSE, PERITONEAL FLUID  PROTEIN, BODY FLUID  ALBUMIN, FLUID  PROTIME-INR  LACTATE DEHYDROGENASE, BODY FLUID  PROTEIN, BODY FLUID  I-STAT CG4 LACTIC ACID, ED  I-STAT CG4 LACTIC ACID, ED    Imaging Review [place] Paracentesis  05/27/2014   INDICATION: Cirrhosis, autoimmune hepatitis with PSC overlap syndrome, splenomegaly, recurrent ascites. Request is made for diagnostic and therapeutic paracentesis.  EXAM: ULTRASOUND-GUIDED DIAGNOSTIC AND THERAPEUTIC PARACENTESIS  COMPARISON:  Prior paracentesis on 04/07/14  MEDICATIONS: None.  COMPLICATIONS: None immediate  TECHNIQUE: Informed written consent was obtained from the patient after a discussion of the risks, benefits and alternatives to treatment. A timeout was performed prior to the initiation of the procedure.  Initial ultrasound scanning demonstrates a moderate to large amount of ascites within the left lower abdominal quadrant. The left lower abdomen was prepped and draped in the usual sterile fashion. 1% lidocaine was used for local anesthesia. Under direct ultrasound guidance, a 19 gauge, 7-cm, Yueh catheter was introduced. An ultrasound image was saved for documentation purposed. The paracentesis was performed. The catheter was removed and a dressing was applied. The patient tolerated the procedure well without immediate post procedural complication.  FINDINGS: A total of approximately 4.3 liters of yellow fluid was removed. Samples were sent to the laboratory as requested by the clinical team.  IMPRESSION: Successful ultrasound-guided diagnostic and therapeutic paracentesis yielding 4.3 liters of peritoneal fluid.  Read by: Jeananne Rama, PA-C   Electronically Signed   By: Judie Petit.  Shick M.D.   On: 05/27/2014 12:09   Dg Chest Port 1 View  05/27/2014   CLINICAL DATA:  Headache. Shortness of breath. Cough  and chest pain for 3 days.  EXAM: PORTABLE CHEST - 1 VIEW  COMPARISON:  01/12/2014  FINDINGS: Mild central airway thickening. The lungs appear otherwise clear. Cardiac and mediastinal margins appear normal. No pleural effusion.  IMPRESSION: 1. Airway thickening is present, suggesting bronchitis or reactive airways disease.   Electronically Signed   By: Gaylyn Rong M.D.   On: 05/27/2014 13:17     EKG Interpretation None      MDM   Final diagnoses:  Hypokalemia  Generalized abdominal pain  Neutropenia    Patient with history of autoimmune hepatitis here for evaluation of abdominal pain. Initial concern for possible SBP given her tenderness and history of SBP back in December. Patient was treated with Rocephin empirically pending paracentesis. Discussed with hospitalist regarding observation given progressive abdominal pain with elevation in liver enzymes and neutropenia.    Tilden Fossa, MD 05/27/14 [phone]

## 2014-05-27 NOTE — ED Notes (Signed)
Pt from home via EMS c/o abd pain and headache. Pt has hx of liver disease. Pt sts that she has distention to abd and reports "fluid" and reports that she has had it drained in the past but not lately. Pt received 200 mcg Fentanyl en route. Pt is A&O and in NAD. Pt reports 0/10 pain after meds.

## 2014-05-27 NOTE — Progress Notes (Signed)
CRITICAL VALUE ALERT  Critical value received:  hgb 6.7  Date of notification:  05/27/14  Time of notification:  2230  Critical value read back:yes  Nurse who received alert:  Joya GaskinsEleanor Madriaga  MD notified (1st page):  Daphane ShepherdM Lynch  Time of first page:  2245  MD notified (2nd page):  Time of second page:  Responding MD: Daphane ShepherdM Lynch  Time MD responded:  2300

## 2014-05-28 DIAGNOSIS — A419 Sepsis, unspecified organism: Principal | ICD-10-CM

## 2014-05-28 DIAGNOSIS — D61818 Other pancytopenia: Secondary | ICD-10-CM

## 2014-05-28 DIAGNOSIS — K754 Autoimmune hepatitis: Secondary | ICD-10-CM

## 2014-05-28 DIAGNOSIS — R1084 Generalized abdominal pain: Secondary | ICD-10-CM | POA: Insufficient documentation

## 2014-05-28 LAB — CBC WITH DIFFERENTIAL/PLATELET
BASOS ABS: 0 10*3/uL (ref 0.0–0.1)
Basophils Relative: 2 % — ABNORMAL HIGH (ref 0–1)
Eosinophils Absolute: 0 10*3/uL (ref 0.0–0.7)
Eosinophils Relative: 1 % (ref 0–5)
HCT: 27.6 % — ABNORMAL LOW (ref 36.0–46.0)
HEMOGLOBIN: 8.9 g/dL — AB (ref 12.0–15.0)
LYMPHS ABS: 0.4 10*3/uL — AB (ref 0.7–4.0)
LYMPHS PCT: 29 % (ref 12–46)
MCH: 29.6 pg (ref 26.0–34.0)
MCHC: 32.2 g/dL (ref 30.0–36.0)
MCV: 91.7 fL (ref 78.0–100.0)
Monocytes Absolute: 0.2 10*3/uL (ref 0.1–1.0)
Monocytes Relative: 13 % — ABNORMAL HIGH (ref 3–12)
NEUTROS PCT: 55 % (ref 43–77)
Neutro Abs: 0.8 10*3/uL — ABNORMAL LOW (ref 1.7–7.7)
PLATELETS: 44 10*3/uL — AB (ref 150–400)
RBC: 3.01 MIL/uL — AB (ref 3.87–5.11)
RDW: 17.5 % — ABNORMAL HIGH (ref 11.5–15.5)
WBC: 1.4 10*3/uL — AB (ref 4.0–10.5)

## 2014-05-28 LAB — COMPREHENSIVE METABOLIC PANEL
ALT: 24 U/L (ref 0–35)
AST: 57 U/L — ABNORMAL HIGH (ref 0–37)
Albumin: 2.9 g/dL — ABNORMAL LOW (ref 3.5–5.2)
Alkaline Phosphatase: 260 U/L — ABNORMAL HIGH (ref 39–117)
Anion gap: 5 (ref 5–15)
BILIRUBIN TOTAL: 4.6 mg/dL — AB (ref 0.3–1.2)
BUN: <5 mg/dL — ABNORMAL LOW (ref 6–23)
CHLORIDE: 102 mmol/L (ref 96–112)
CO2: 26 mmol/L (ref 19–32)
Calcium: 7.7 mg/dL — ABNORMAL LOW (ref 8.4–10.5)
Creatinine, Ser: 0.59 mg/dL (ref 0.50–1.10)
GFR calc Af Amer: >90 mL/min (ref >90)
GFR calc non Af Amer: >90 mL/min (ref >90)
Glucose, Bld: 128 mg/dL — ABNORMAL HIGH (ref 70–99)
Potassium: 3.3 mmol/L — ABNORMAL LOW (ref 3.5–5.1)
Sodium: 133 mmol/L — ABNORMAL LOW (ref 135–145)
Total Protein: 4.9 g/dL — ABNORMAL LOW (ref 6.0–8.3)

## 2014-05-28 LAB — MAGNESIUM: MAGNESIUM: 2.2 mg/dL (ref 1.5–2.5)

## 2014-05-28 LAB — ABO/RH: ABO/RH(D): B POS

## 2014-05-28 LAB — URINE CULTURE
Colony Count: NO GROWTH
Culture: NO GROWTH

## 2014-05-28 LAB — PREPARE RBC (CROSSMATCH)

## 2014-05-28 LAB — LACTIC ACID, PLASMA: Lactic Acid, Venous: 1.2 mmol/L (ref 0.5–2.0)

## 2014-05-28 MED ORDER — VANCOMYCIN HCL 500 MG IV SOLR
500.0000 mg | Freq: Two times a day (BID) | INTRAVENOUS | Status: DC
  Administered 2014-05-28 – 2014-05-29 (×3): 500 mg via INTRAVENOUS
  Filled 2014-05-28 (×4): qty 500

## 2014-05-28 MED ORDER — SODIUM CHLORIDE 0.9 % IV BOLUS (SEPSIS)
1000.0000 mL | Freq: Once | INTRAVENOUS | Status: AC
  Administered 2014-05-28: 1000 mL via INTRAVENOUS

## 2014-05-28 MED ORDER — POTASSIUM CHLORIDE CRYS ER 20 MEQ PO TBCR
20.0000 meq | EXTENDED_RELEASE_TABLET | Freq: Once | ORAL | Status: AC
  Administered 2014-05-28: 20 meq via ORAL
  Filled 2014-05-28: qty 1

## 2014-05-28 MED ORDER — FLUCONAZOLE IN SODIUM CHLORIDE 400-0.9 MG/200ML-% IV SOLN
400.0000 mg | INTRAVENOUS | Status: DC
  Administered 2014-05-29: 400 mg via INTRAVENOUS
  Filled 2014-05-28: qty 200

## 2014-05-28 MED ORDER — LACTULOSE 10 GM/15ML PO SOLN
20.0000 g | Freq: Two times a day (BID) | ORAL | Status: DC
  Administered 2014-05-28 – 2014-05-29 (×3): 20 g via ORAL
  Filled 2014-05-28 (×3): qty 30

## 2014-05-28 MED ORDER — FLUCONAZOLE IN SODIUM CHLORIDE 400-0.9 MG/200ML-% IV SOLN
800.0000 mg | Freq: Once | INTRAVENOUS | Status: AC
  Administered 2014-05-28: 800 mg via INTRAVENOUS
  Filled 2014-05-28: qty 400

## 2014-05-28 MED ORDER — SODIUM CHLORIDE 0.9 % IV SOLN: 500.0000 mg | Freq: Four times a day (QID) | INTRAVENOUS | Status: DC

## 2014-05-28 MED ORDER — SODIUM CHLORIDE 0.9 % IV SOLN
250.0000 mg | Freq: Four times a day (QID) | INTRAVENOUS | Status: DC
  Administered 2014-05-28 – 2014-05-29 (×6): 250 mg via INTRAVENOUS
  Filled 2014-05-28 (×7): qty 250

## 2014-05-28 MED ORDER — SODIUM CHLORIDE 0.9 % IV SOLN
INTRAVENOUS | Status: DC
  Administered 2014-05-28 – 2014-05-29 (×3): via INTRAVENOUS
  Filled 2014-05-28 (×4): qty 1000

## 2014-05-28 MED ORDER — PANTOPRAZOLE SODIUM 40 MG IV SOLR
40.0000 mg | Freq: Two times a day (BID) | INTRAVENOUS | Status: DC
  Administered 2014-05-28 – 2014-05-29 (×3): 40 mg via INTRAVENOUS
  Filled 2014-05-28 (×3): qty 40

## 2014-05-28 MED ORDER — HYDROCORTISONE NA SUCCINATE PF 100 MG IJ SOLR
50.0000 mg | Freq: Two times a day (BID) | INTRAMUSCULAR | Status: DC
  Administered 2014-05-28 – 2014-05-29 (×3): 50 mg via INTRAVENOUS
  Filled 2014-05-28 (×3): qty 1

## 2014-05-28 NOTE — Progress Notes (Signed)
PROGRESS NOTE  Melanie Conley EAV:[id number] DOB: 04/27/1990 DOA: 05/27/2014 PCP: Woodfin Ganja, MD  Brief history 24 y.o. female with a history of autoimmune hepatitis with PSC overlap syndrome followed by [facility]. She is s/p multiple EGDs with esophageal banding. She was most recently discharged from [facility] on 01/16/2014 after an episode of SBP. She was discharged with Augmentin at that time. Most recently, she saw her hepatologist on 05/20/2014 when an EGD was performed. She was found to have one column of grade 2 esophageal varices that were banded. The patient is a poor historian, but she states that she has been compliant with all her medications except for her lactulose. She states that she only takes the lactulose 2 times per week. After admission, the patient developed a fever up to 103.60F. As a result, the patient's antibiotic coverage was broadened. Assessment/Plan: Sepsis -Broaden antibiotic coverage -Start imipenem and vancomycin -Start IV fluids -Lactic acid initially 0.96 -Start empiric fluconazole due to the patient's risk factors for fungemia including end-stage liver disease and neutropenia -Follow up peritoneal fluid cultures and blood cultures -Urinalysis negative for pyuria, but may not have significant pyuria in the setting of neutropenia--> follow-up urine culture -Stress steroids as the patient was on prednisone Febrile neutropenia -Discontinue Imuran as discussed -The patient's Bactrim may also been playing a role--discontinued -Broaden antibiotic coverage as discussed above Spontaneous bacterial peritonitis -Abdominal pain significantly improved after paracentesis -Continue antibiotics pending culture -Patient received 50 g of albumin after paracentesis -05/27/2014--4 L removed from paracentesis Hypomagnesemia and hypokalemia -Repleted -Repeat potassium and magnesium Autoimmune hepatitis with primary sclerosing cholangitis -Continue Actigall -Continue  prednisone Ascites -Hold Aldactone and furosemide for today -Patient's actual furosemide dose is 60 mg daily and Aldactone is 100 mg daily esophageal varices/portal hypertension -Hold Inderal secondary to blood pressure -monitor Hgb--q 8 hrs -Consult GI -Start Protonix Anemia of chronic disease -Transfuse 1 unit PRBC 05/28/2014 -consult GI--Hgb dropped 2 gram in past 24 hours in setting of hx of esophageal varies although partly due to dilution  Family Communication:   Pt at beside Disposition Plan:   Home when medically stable        Procedures/Studies: US Paracentesis  05/27/2014   INDICATION: Cirrhosis, autoimmune hepatitis with PSC overlap syndrome, splenomegaly, recurrent ascites. Request is made for diagnostic and therapeutic paracentesis.  EXAM: ULTRASOUND-GUIDED DIAGNOSTIC AND THERAPEUTIC PARACENTESIS  COMPARISON:  Prior paracentesis on 04/07/14  MEDICATIONS: None.  COMPLICATIONS: None immediate  TECHNIQUE: Informed written consent was obtained from the patient after a discussion of the risks, benefits and alternatives to treatment. A timeout was performed prior to the initiation of the procedure.  Initial ultrasound scanning demonstrates a moderate to large amount of ascites within the left lower abdominal quadrant. The left lower abdomen was prepped and draped in the usual sterile fashion. 1% lidocaine was used for local anesthesia. Under direct ultrasound guidance, a 19 gauge, 7-cm, Yueh catheter was introduced. An ultrasound image was saved for documentation purposed. The paracentesis was performed. The catheter was removed and a dressing was applied. The patient tolerated the procedure well without immediate post procedural complication.  FINDINGS: A total of approximately 4.3 liters of yellow fluid was removed. Samples were sent to the laboratory as requested by the clinical team.  IMPRESSION: Successful ultrasound-guided diagnostic and therapeutic paracentesis yielding 4.3  liters of peritoneal fluid.  Read by: Jeananne Rama, PA-C   Electronically Signed   By: Judie Petit.  Shick M.D.   On: 05/27/2014 12:09  Dg Chest Port 1 View  05/27/2014   CLINICAL DATA:  Headache. Shortness of breath. Cough and chest pain for 3 days.  EXAM: PORTABLE CHEST - 1 VIEW  COMPARISON:  01/12/2014  FINDINGS: Mild central airway thickening. The lungs appear otherwise clear. Cardiac and mediastinal margins appear normal. No pleural effusion.  IMPRESSION: 1. Airway thickening is present, suggesting bronchitis or reactive airways disease.   Electronically Signed   By: Gaylyn Rong M.D.   On: 05/27/2014 13:17        Subjective: Patient is feeling somewhat better today. Her headache has improved. She states her abdominal pain is over 50% better. She has some nausea without emesis. Denies any chest pain, shortness breath, coughing, hemoptysis, hematemesis, hematochezia, melena. No dysuria or hematuria.  Objective: Filed Vitals:   05/27/14 1552 05/28/14 0124 05/28/14 0150 05/28/14 0405  BP:   Pulse:  96 90 83  Temp:  97.8 F (36.6 C) 98.1 F (36.7 C) 97.9 F (36.6 C)  TempSrc:  Oral Oral Oral  Resp:  Height:      Weight: 47.991 kg (105 lb 12.8 oz)     SpO2:  96% 98% 96%    Intake/Output Summary (Last 24 hours) at 05/28/14 0744 Last data filed at 05/28/14 0405  Gross per 24 hour  Intake    385 ml  Output   1065 ml  Net   -680 ml   Weight change:  Exam:   General:  Pt is alert, follows commands appropriately, not in acute distress  HEENT: No icterus, No thrush, No neck mass, [place]/AT  Cardiovascular: RRR, S1/S2, no rubs, no gallops  Respiratory: CTA bilaterally, no wheezing, no crackles, no rhonchi  Abdomen: Soft/+BS, epigastric tenderness, non distended, no guarding  Extremities: No edema, No lymphangitis, No petechiae, No rashes, no synovitis  Data Reviewed: Basic Metabolic Panel:  Recent Labs Lab 05/27/14 1013  NA 132*  K 2.6*  CL 95*   CO2 30  GLUCOSE 95  BUN <5*  CREATININE 0.81  CALCIUM 7.2*  MG 1.3*   Liver Function Tests:  Recent Labs Lab 05/27/14 1013  AST 78*  ALT 36*  ALKPHOS 403*  BILITOT 4.2*  PROT 5.8*  ALBUMIN 2.5*   No results for input(s): LIPASE, AMYLASE in the last 168 hours.  Recent Labs Lab 05/27/14 1013  AMMONIA 60*   CBC:  Recent Labs Lab 05/27/14 1013 05/27/14 2200  WBC 1.3* 1.1*  NEUTROABS 0.6*  --   HGB 8.5* 6.7*  HCT 26.2* 20.9*  MCV 90.7 90.9  PLT 73* 40*   Cardiac Enzymes: No results for input(s): CKTOTAL, CKMB, CKMBINDEX, TROPONINI in the last 168 hours. BNP: Invalid input(s): POCBNP CBG: No results for input(s): GLUCAP in the last 168 hours.  Recent Results (from the past 240 hour(s))  Body fluid culture     Status: None (Preliminary result)   Collection Time: 05/27/14 12:19 PM  Result Value Ref Range Status   Specimen Description PERITONEAL CAVITY  Final   Special Requests NONE  Final   Gram Stain   Final    FEW WBC PRESENT, PREDOMINANTLY PMN NO ORGANISMS SEEN Performed at [facility]    Culture PENDING  Incomplete   Report Status PENDING  Incomplete     Scheduled Meds: . [START ON 05/29/2014] fluconazole (DIFLUCAN) IV  400 mg Intravenous Q24H  . fluconazole (DIFLUCAN) IV  800 mg Intravenous Once  . hydrocortisone sod succinate (SOLU-CORTEF) inj  50 mg  Intravenous Q12H  . imipenem-cilastatin  500 mg Intravenous 4 times per day  . lactulose  20 g Oral BID  . sodium chloride  1,000 mL Intravenous Once  . sodium chloride  3 mL Intravenous Q12H  . ursodiol  600 mg Oral Daily  . vancomycin  500 mg Intravenous Q12H   Continuous Infusions: . sodium chloride 0.9 % 1,000 mL with potassium chloride 20 mEq infusion       Demarco Bacci, DO  [facility] Pager [phone]  If 7PM-7AM, please contact night-coverage [email or web address] Password [email or web address] 05/28/2014, 7:44 AM   LOS: 1 day

## 2014-05-28 NOTE — Consult Note (Signed)
Consultation  Referring Provider:  [facility]    Primary Care Physician:  Rayvon Char, MD Primary Gastroenterologist:    Shelbie Ammons, MD     Reason for Consultation: PBC             HPI:   Melanie Conley is a 24 y.o. female with a history of autoimmune hepatitis with PSC overlap syndrome followed by [facility]. She is on prednisone, imuran and actigall. Cirrhosis complicated by a history of varices and ascites with SBP. She takes weekly Levaquin and is on 45m of lasix and 1084mof aldactone. She takes all of her meds but not always the lactulose. Monitors salt intake at home. Patient hasn't met psychosocial evaluation to be listed for transplant.   [facility] with findings of grade II esoph varices . No recent signs of bleeding.  One band was placed[facility] [place] with abdominal pain and headache. She had temp of 103.  Abdominal pain started 3-4 days ago, patient feels better after paracentesis with removal of 4 liters of fluid. No SBP. Patient believes abdominal pain secondary to umbilical hernia and was told it could be repaired.   Past Medical History  Diagnosis Date  . Liver disease   . Hepatitis   . Anemia   . Esophageal varices     Past Surgical History  Procedure Laterality Date  . Gastric banding port revision    . Tonsillectomy    . Esophagogastroduodenoscopy  07/22/2011    Procedure: ESOPHAGOGASTRODUODENOSCOPY (EGD);  Surgeon: SaWonda HornerMD;  [facility];  Service: Endoscopy;  Laterality: N/A;     FMIrwinno liver disease in family.  History  Substance Use Topics  . Smoking status: Never Smoker   . Smokeless tobacco: Never Used  . Alcohol Use: No    Prior to Admission medications   Medication Sig Start Date End Date Taking? Authorizing Provider  azaTHIOprine (IMURAN) 50 MG tablet Take 100 mg by mouth daily.    Yes Historical Provider, MD    furosemide (LASIX) 40 MG tablet Take 40-80 mg by mouth daily.    Yes Historical Provider, MD  lactulose (CHRONULAC) 10 GM/15ML solution Take 20 g by mouth 2 (two) times daily as needed (constipation).    Yes Historical Provider, MD  levofloxacin (LEVAQUIN) 750 MG tablet Take 750 mg by mouth once a week.    Yes Historical Provider, MD  predniSONE (DELTASONE) 5 MG tablet Take 5 mg by mouth daily.   Yes Historical Provider, MD  propranolol (INDERAL LA) 60 MG 24 hr capsule Take 60 mg by mouth daily.   Yes Historical Provider, MD  spironolactone (ALDACTONE) 25 MG tablet Take 100 mg by mouth daily.    Yes Historical Provider, MD  ursodiol (ACTIGALL) 300 MG capsule Take 600 mg by mouth daily.    Yes Historical Provider, MD  Vitamin D, Ergocalciferol, (DRISDOL) 50000 UNITS CAPS capsule Take 1 capsule by mouth every 7 (seven) days. On Wednesdays 02/25/14 02/25/15 Yes Historical Provider, MD  amoxicillin-clavulanate (AUGMENTIN) 875-125 MG per tablet Take 1 tablet by mouth 2 (two) times daily. Patient not taking: Reported on 04/07/2014 01/16/14   EsErline HauMD  ondansetron (ZOFRAN) 4 MG tablet Take 1 tablet (4 mg total) by mouth every 6 (six) hours as needed for nausea. Patient not taking: Reported on 04/07/2014 01/16/14   EsErline HauMD  oxyCODONE (OXY IR/ROXICODONE) 5 MG immediate release tablet Take 1-2 tablets (5-10 mg total) by mouth every 4 (four) hours as needed for moderate pain. Patient not taking: Reported on 04/07/2014 01/16/14   Erline Hau, MD    Current Facility-Administered Medications  Medication Dose Route Frequency Provider Last Rate Last Dose  . acetaminophen (TYLENOL) tablet 500 mg  500 mg Oral Q6H PRN Orson Eva, MD   500 mg at 05/27/14 1807  . [START ON 05/29/2014] fluconazole (DIFLUCAN) IVPB 400 mg  400 mg Intravenous Q24H Orson Eva, MD      . fluconazole (DIFLUCAN) IVPB 800 mg  800 mg Intravenous Once Shanon Brow Tat, MD   800 mg at 05/28/14 0919  .  hydrocortisone sodium succinate (SOLU-CORTEF) 100 MG injection 50 mg  50 mg Intravenous Q12H Orson Eva, MD   50 mg at 05/28/14 0920  . imipenem-cilastatin (PRIMAXIN) 250 mg in sodium chloride 0.9 % 100 mL IVPB  250 mg Intravenous Q6H Orson Eva, MD   250 mg at 05/28/14 0919  . lactulose (CHRONULAC) 10 GM/15ML solution 20 g  20 g Oral BID PRN Orson Eva, MD      . lactulose (CHRONULAC) 10 GM/15ML solution 20 g  20 g Oral BID Orson Eva, MD   20 g at 05/28/14 8786  . morphine 2 MG/ML injection 2 mg  2 mg Intravenous Q2H PRN Ritta Slot, NP   2 mg at 05/28/14 7672  . ondansetron (ZOFRAN) tablet 4 mg  4 mg Oral Q6H PRN Orson Eva, MD       Or  . ondansetron [facility]) injection 4 mg  4 mg Intravenous Q6H PRN Orson Eva, MD   4 mg at 05/27/14 1717  . oxyCODONE (Oxy IR/ROXICODONE) immediate release tablet 5 mg  5 mg Oral Q4H PRN Orson Eva, MD   5 mg at 05/27/14 1717  . pantoprazole (PROTONIX) injection 40 mg  40 mg Intravenous Q12H Orson Eva, MD   40 mg at 05/28/14 0920  . sodium chloride 0.9 % 1,000 mL with potassium chloride 20 mEq infusion   Intravenous Continuous Orson Eva, MD 100 mL/hr at 05/28/14 0808    . sodium chloride 0.9 % bolus 1,000 mL  1,000 mL Intravenous Once Orson Eva, MD   1,000 mL at 05/28/14 0801  . sodium chloride 0.9 % injection 3 mL  3 mL Intravenous Q12H Orson Eva, MD   3 mL at 05/28/14 0921  . ursodiol (ACTIGALL) capsule 600 mg  600 mg Oral Daily Orson Eva, MD   600 mg at 05/27/14 1726  . vancomycin (VANCOCIN) 500 mg in sodium chloride 0.9 % 100 mL IVPB  500 mg Intravenous Q12H Angela Adam, RPH   500 mg at 05/28/14 0947    Allergies as of 05/27/2014  . (No Known Allergies)     Review of Systems:    All systems reviewed and negative except where noted in HPI.    Physical Exam:  Vital signs in last 24 hours: Temp:  [97.8 F (36.6 C)-103.1 F (39.5 C)] 97.9 F (36.6 C) (04/30 0405) Pulse Rate:  [83-117] 83 (04/30 0918) Resp:  [18-22] 20 (04/30 0405) BP:  (91-139)/(48-86) 101/60 mmHg (04/30 0918) SpO2:  [93 %-100 %] 96 % (04/30 0405) Weight:  [100 lb (45.36 kg)-105 lb 12.8 oz (47.991 kg)] 105 lb 12.8 oz (47.991 kg) (04/29 1552) Last BM Date: 05/26/14 General:   Pleasant black female in NAD Head:  Normocephalic and atraumatic. Eyes:   Mildly icteric.  Ears:  Normal auditory acuity. Neck:  Supple; no masses felt Lungs:  Respirations even and unlabored. Lungs clear to auscultation bilaterally.   No wheezes, crackles, or rhonchi.  Heart:  Regular rate and rhythm;  murmur heard. Abdomen:  Soft, nondistended, nontender. Normal bowel sounds. No appreciable masses or hepatomegaly.  Rectal:  Not performed.  Msk:  Symmetrical without gross deformities.  Extremities:  Without edema. Neurologic:  Alert and  oriented x4;  grossly normal neurologically. Skin:  Intact without significant lesions or rashes. Cervical Nodes:  No significant cervical adenopathy. Psych:  Alert and cooperative. Normal affect.  LAB RESULTS:  Recent Labs  05/27/14 1013 05/27/14 2200 05/28/14 0707  WBC 1.3* 1.1* 1.4*  HGB 8.5* 6.7* 8.9*  HCT 26.2* 20.9* 27.6*  PLT 73* 40* 44*   BMET  Recent Labs  05/27/14 1013 05/28/14 0707  NA 132* 133*  K 2.6* 3.3*  CL 95* 102  CO2 30 26  GLUCOSE 95 128*  BUN <5* <5*  CREATININE 0.81 0.59  CALCIUM 7.2* 7.7*   LFT  Recent Labs  05/28/14 0707  PROT 4.9*  ALBUMIN 2.9*  AST 57*  ALT 24  ALKPHOS 260*  BILITOT 4.6*   PT/INR  Recent Labs  05/27/14 2000  LABPROT 20.6*  INR 1.75*    STUDIES: US Paracentesis  05/27/2014   INDICATION: Cirrhosis, autoimmune hepatitis with PSC overlap syndrome, splenomegaly, recurrent ascites. Request is made for diagnostic and therapeutic paracentesis.  EXAM: ULTRASOUND-GUIDED DIAGNOSTIC AND THERAPEUTIC PARACENTESIS  COMPARISON:  Prior paracentesis on 04/07/14  MEDICATIONS: None.  COMPLICATIONS: None immediate  TECHNIQUE: Informed written consent was obtained from the patient after  a discussion of the risks, benefits and alternatives to treatment. A timeout was performed prior to the initiation of the procedure.  Initial ultrasound scanning demonstrates a moderate to large amount of ascites within the left lower abdominal quadrant. The left lower abdomen was prepped and draped in the usual sterile fashion. 1% lidocaine was used for local anesthesia. Under direct ultrasound guidance, a 19 gauge, 7-cm, Yueh catheter was introduced. An ultrasound image was saved for documentation purposed. The paracentesis was performed. The catheter was removed and a dressing was applied. The patient tolerated the procedure well without immediate post procedural complication.  FINDINGS: A total of approximately 4.3 liters of yellow fluid was removed. Samples were sent to the laboratory as requested by the clinical team.  IMPRESSION: Successful ultrasound-guided diagnostic and therapeutic paracentesis yielding 4.3 liters of peritoneal fluid.  Read by: Rowe Robert, PA-C   Electronically Signed   By: Jerilynn Mages.  Shick M.D.   On: 05/27/2014 12:09   Dg Chest Port 1 View  05/27/2014   CLINICAL DATA:  Headache. Shortness of breath. Cough and chest pain for 3 days.  EXAM: PORTABLE CHEST - 1 VIEW  COMPARISON:  01/12/2014  FINDINGS: Mild central airway thickening. The lungs appear otherwise clear. Cardiac and mediastinal margins appear normal. No pleural effusion.  IMPRESSION: 1. Airway thickening is present, suggesting bronchitis or reactive airways disease.   Electronically Signed   By: Van Clines M.D.   On: 05/27/2014 13:17   PREVIOUS ENDOSCOPIES:             EGDs - [facility]   Impression / Plan:   108. 24 year old female with autoimmune hepatitis / [place] overlap syndrome followed by [facility]. She is on prednisone, imuran, and actigall.  Cirrhosis complicated by portal HTN with esophageal varices, ascites / SBP. Patient is s/p banding of grade 2 varices at  [facility] on 05/20/14. Now admitted with abdominal pain and fever of 103.     **She is being treated for sepsis, on Primaxin and Vanco. Empiric fluconazole started as well. Primary team pan culturing her.  CXR - airway thickening suggesting bronchitis or reactive airway disease.  **She is getting stress dose of steroids (on prednisone at home). **Imuran stopped because of neutropenia.  Abdominal pain improved following removal of 4 liter of ascitic fluid. Afebrile this am. No SBP by fluid cell count  (on prophylaxis at home for history of SBP). Patient believes pain coming from umbilical hernia which she wants repaired. Explained that she is at high risk for surgery given liver disease.  Continue supportive care for now.    2. Normocytic anemia, hgb 6.7 last night, it was 8.5 on admission and that seems to be her baseline. No active bleeding. No signs of recent variceal bleeding at time of EGD with banding 05/20/14. Maybe the 6.7 was erroneous as hgb increased 2grams with just one unit of blood given this am.    Thanks   LOS: 1 day   Tye Savoy  05/28/2014, 9:43 AM

## 2014-05-28 NOTE — Progress Notes (Signed)
ANTIBIOTIC CONSULT NOTE - INITIAL  Pharmacy Consult for vancomycin Indication: sepsis  No Known Allergies  Patient Measurements: Height: 4\' 11"  (149.9 cm) Weight: 105 lb 12.8 oz (47.991 kg) IBW/kg (Calculated) : 43.2   Vital Signs: Temp: 97.9 F (36.6 C) (04/30 0405) Temp Source: Oral (04/30 0405) BP: 98/66 mmHg (04/30 0405) Pulse Rate: 83 (04/30 0405) Intake/Output from previous day: 04/29 0701 - 04/30 0700 In: 385 [Blood:335; IV Piggyback:50] Out: 1065 [Urine:1065] Intake/Output from this shift:    Labs:  Recent Labs  05/27/14 1013 05/27/14 2200  WBC 1.3* 1.1*  HGB 8.5* 6.7*  PLT 73* 40*  CREATININE 0.81  --    Estimated Creatinine Clearance: 73.7 mL/min (by C-G formula based on Cr of 0.81). No results for input(s): VANCOTROUGH, VANCOPEAK, VANCORANDOM, GENTTROUGH, GENTPEAK, GENTRANDOM, TOBRATROUGH, TOBRAPEAK, TOBRARND, AMIKACINPEAK, AMIKACINTROU, AMIKACIN in the last 72 hours.   Microbiology: Recent Results (from the past 720 hour(s))  Body fluid culture     Status: None (Preliminary result)   Collection Time: 05/27/14 12:19 PM  Result Value Ref Range Status   Specimen Description PERITONEAL CAVITY  Final   Special Requests NONE  Final   Gram Stain   Final    FEW WBC PRESENT, PREDOMINANTLY PMN NO ORGANISMS SEEN [facility]formed at [facility]    Culture PENDING  Incomplete   Report Status PENDING  Incomplete    Medical History: Past Medical History  Diagnosis Date  . Liver disease   . Hepatitis   . Anemia   . Esophageal varices     Medications:  Scheduled:  . [START ON 05/29/2014] fluconazole (DIFLUCAN) IV  400 mg Intravenous Q24H  . fluconazole (DIFLUCAN) IV  800 mg Intravenous Once  . imipenem-cilastatin  500 mg Intravenous 4 times per day  . predniSONE  5 mg Oral Q breakfast  . sodium chloride  1,000 mL Intravenous Once  . sodium chloride  3 mL Intravenous Q12H  . ursodiol  600 mg Oral Daily   Assessment: 24 y.o. female with a history  of autoimmune hepatitis with PSC overlap syndrome [facility] She is s/p multiple EGDs with esophageal banding. [facility] after an episode of SBP. She was discharged with Augmentin at that time.  The patient underwent paracentesis in the emergency department removing 4L.  Pharmacy consulted to dose vancomycin for sepsis.  Goal of Therapy:  Vancomycin trough level 15-20 mcg/ml Vancomycin per renal function  Plan:  Vancomycin 500mg  IV q12h Follow renal function, cultures, clinical course vanc trough as needed  Arley Phenixllen Shown Dissinger RPh 05/28/2014, 7:14 AM Pager [phone]

## 2014-05-28 NOTE — Progress Notes (Signed)
Patient met the criteria for severe sepsis per CMS guidelines. On call paged and made aware.

## 2014-05-29 ENCOUNTER — Encounter (HOSPITAL_COMMUNITY): Payer: Self-pay | Admitting: Family Medicine

## 2014-05-29 DIAGNOSIS — K2971 Gastritis, unspecified, with bleeding: Secondary | ICD-10-CM

## 2014-05-29 LAB — CBC WITH DIFFERENTIAL/PLATELET
BASOS ABS: 0 10*3/uL (ref 0.0–0.1)
Basophils Relative: 1 % (ref 0–1)
EOS ABS: 0 10*3/uL (ref 0.0–0.7)
Eosinophils Relative: 0 % (ref 0–5)
HEMATOCRIT: 32.2 % — AB (ref 36.0–46.0)
Hemoglobin: 10.4 g/dL — ABNORMAL LOW (ref 12.0–15.0)
LYMPHS ABS: 0.5 10*3/uL — AB (ref 0.7–4.0)
Lymphocytes Relative: 13 % (ref 12–46)
MCH: 29.8 pg (ref 26.0–34.0)
MCHC: 32.3 g/dL (ref 30.0–36.0)
MCV: 92.3 fL (ref 78.0–100.0)
MONO ABS: 0.3 10*3/uL (ref 0.1–1.0)
Monocytes Relative: 9 % (ref 3–12)
NEUTROS ABS: 3 10*3/uL (ref 1.7–7.7)
Neutrophils Relative %: 77 % (ref 43–77)
Platelets: 41 10*3/uL — ABNORMAL LOW (ref 150–400)
RBC: 3.49 MIL/uL — ABNORMAL LOW (ref 3.87–5.11)
RDW: 18.5 % — AB (ref 11.5–15.5)
WBC: 3.8 10*3/uL — ABNORMAL LOW (ref 4.0–10.5)

## 2014-05-29 MED ORDER — AMOXICILLIN-POT CLAVULANATE 875-125 MG PO TABS: 1.0000 | ORAL_TABLET | Freq: Two times a day (BID) | ORAL | Status: DC

## 2014-05-29 MED ORDER — LACTULOSE 10 GM/15ML PO SOLN: 20.0000 g | Freq: Two times a day (BID) | ORAL | Status: DC

## 2014-05-29 NOTE — Discharge Summary (Signed)
Physician Discharge Summary  RAHI CHANDONNET WRU:[id number] DOB: Aug 31, 1990 DOA: 05/27/2014  PCP: Woodfin Ganja, MD  Admit date: 05/27/2014 Discharge date: 05/29/2014  Time spent: 45 minutes  Recommendations for Outpatient Follow-up:  1. Stop Imuran until seen by Dr. Jackquline Denmark had neutropenic fever this admit and would prefer another CBC + Diff and ANC prior to resumption of Imuran 2. Continue low dose prednisone 3. Continue Pain meds as per PCP-no opiate medication were Rx for patient on d/c from hospital 4. Needs OP CBC and diff 5. Needs Ascitic fluid cult follow -up as OP  Discharge Diagnoses:  Active Problems:   GI bleeding   Anemia   SBP (spontaneous bacterial peritonitis)   Autoimmune hepatitis   Spontaneous bacterial peritonitis   Leukopenia   Sepsis   Generalized abdominal pain   Discharge Condition: fair  Diet recommendation: Low salt HH  Brief history 24 y.o. ?AI hepatitis + PSC overlap syndrome followed by [facility]- s/p multiple EGDs with esophageal banding.   most recently discharged from [facility] on 01/16/2014 after an episode of SBP.   discharged c Augmentin at that time.  Most recently, she saw Dr Piedad Climes @ [facility] on 05/20/2014 when an EGD was performed.  She was found to have one column of grade 2 esophageal varices that were banded.   poor historian, but she states that she has been compliant with all her medications except for her lactulose. She states that she only takes the lactulose 2 times per week.   She was pareaentesed this admission 4 liters and cultures were drawn for a suspicion of SBP After admission, the patient developed a fever up to 103.76F. As a result, the patient's antibiotic coverage was broadened from to include Imipenem and Vanc and Fluconazole on 4/30 given her immunocompromised state from both Autoimmune hepatitis and immunosuppression from Imuran Patient was also anemic to 6.8 which might have retrosepctively represented a lab artifact She did  receive I U PRBC and on day of d/c Hb was 10.4--It was felt her ? in Hb was a reflection of both possible infection and effects of Imuran Her cultures were neg x 2 days and patient was insistent on going home It was felt that since her WBc had climbed from 1.4--->3.8 on day of discharge that she represented low risk for ongoing infection and her Abx were narrowed to Augmentin bid x 5 more days  Consultations:  GI  Discharge Exam: Filed Vitals:   05/29/14 1335  BP: 92/52  Pulse: 59  Temp: 97.4 F (36.3 C)  Resp:     General: alert pleasant in NAd Cardiovascular: s1 s 2no m/r/g Respiratory: clear  Discharge Instructions   Discharge Instructions    Diet - low sodium heart healthy    Complete by:  As directed      Discharge instructions    Complete by:  As directed   Please take your lactulose and other mediciaitons as scheudled Please contact Dr. Piedad Climes regarding your Liver issues-she can guide you with regards to this NO Imuran until you see Dr. Piedad Climes NO pain med will be prescribed for you on discharge-please follow up with your regular MD Continue Augmentin for 5 days-script sent to your pharmacy in [place]     Increase activity slowly    Complete by:  As directed           Discharge Medication List as of 05/29/2014  5:21 PM    CONTINUE these medications which have CHANGED   Details  amoxicillin-clavulanate (AUGMENTIN) 875-125  MG per tablet Take 1 tablet by mouth 2 (two) times daily., Starting 05/29/2014, Until Discontinued, Normal    lactulose (CHRONULAC) 10 GM/15ML solution Take 30 mLs (20 g total) by mouth 2 (two) times daily., Starting 05/29/2014, Until Discontinued, Normal      CONTINUE these medications which have NOT CHANGED   Details  furosemide (LASIX) 40 MG tablet Take 40-80 mg by mouth daily. , Until Discontinued, Historical Med    predniSONE (DELTASONE) 5 MG tablet Take 5 mg by mouth daily., Until Discontinued, Historical Med    propranolol (INDERAL  LA) 60 MG 24 hr capsule Take 60 mg by mouth daily., Until Discontinued, Historical Med    spironolactone (ALDACTONE) 25 MG tablet Take 100 mg by mouth daily. , Until Discontinued, Historical Med    ursodiol (ACTIGALL) 300 MG capsule Take 600 mg by mouth daily. , Until Discontinued, Historical Med    Vitamin D, Ergocalciferol, (DRISDOL) 50000 UNITS CAPS capsule Take 1 capsule by mouth every 7 (seven) days. On Wednesdays, Starting 02/25/2014, Until Sat 02/25/15, Historical Med    ondansetron (ZOFRAN) 4 MG tablet Take 1 tablet (4 mg total) by mouth every 6 (six) hours as needed for nausea., Starting 01/16/2014, Until Discontinued, Normal    oxyCODONE (OXY IR/ROXICODONE) 5 MG immediate release tablet Take 1-2 tablets (5-10 mg total) by mouth every 4 (four) hours as needed for moderate pain., Starting 01/16/2014, Until Discontinued, Print      STOP taking these medications     azaTHIOprine (IMURAN) 50 MG tablet      levofloxacin (LEVAQUIN) 750 MG tablet        No Known Allergies    The results of significant diagnostics from this hospitalization (including imaging, microbiology, ancillary and laboratory) are listed below for reference.    Significant Diagnostic Studies: US Paracentesis  05/27/2014   INDICATION: Cirrhosis, autoimmune hepatitis with PSC overlap syndrome, splenomegaly, recurrent ascites. Request is made for diagnostic and therapeutic paracentesis.  EXAM: ULTRASOUND-GUIDED DIAGNOSTIC AND THERAPEUTIC PARACENTESIS  COMPARISON:  Prior paracentesis on 04/07/14  MEDICATIONS: None.  COMPLICATIONS: None immediate  TECHNIQUE: Informed written consent was obtained from the patient after a discussion of the risks, benefits and alternatives to treatment. A timeout was performed prior to the initiation of the procedure.  Initial ultrasound scanning demonstrates a moderate to large amount of ascites within the left lower abdominal quadrant. The left lower abdomen was prepped and draped in the  usual sterile fashion. 1% lidocaine was used for local anesthesia. Under direct ultrasound guidance, a 19 gauge, 7-cm, Yueh catheter was introduced. An ultrasound image was saved for documentation purposed. The paracentesis was performed. The catheter was removed and a dressing was applied. The patient tolerated the procedure well without immediate post procedural complication.  FINDINGS: A total of approximately 4.3 liters of yellow fluid was removed. Samples were sent to the laboratory as requested by the clinical team.  IMPRESSION: Successful ultrasound-guided diagnostic and therapeutic paracentesis yielding 4.3 liters of peritoneal fluid.  Read by: Jeananne Rama, PA-C   Electronically Signed   By: Judie Petit.  Shick M.D.   On: 05/27/2014 12:09   Dg Chest Port 1 View  05/27/2014   CLINICAL DATA:  Headache. Shortness of breath. Cough and chest pain for 3 days.  EXAM: PORTABLE CHEST - 1 VIEW  COMPARISON:  01/12/2014  FINDINGS: Mild central airway thickening. The lungs appear otherwise clear. Cardiac and mediastinal margins appear normal. No pleural effusion.  IMPRESSION: 1. Airway thickening is present, suggesting bronchitis or reactive airways disease.  Electronically Signed   By: Gaylyn RongWalter  Liebkemann M.D.   On: 05/27/2014 13:17    Microbiology: Recent Results (from the past 240 hour(s))  Urine culture     Status: None   Collection Time: 05/27/14  9:40 AM  Result Value Ref Range Status   Specimen Description URINE, CATHETERIZED  Final   Special Requests NONE  Final   Colony Count NO GROWTH Performed at [facility]   Final   Culture NO GROWTH Performed at [facility]   Final   Report Status 05/28/2014 FINAL  Final  Blood Culture (routine x 2)     Status: None (Preliminary result)   Collection Time: 05/27/14 10:16 AM  Result Value Ref Range Status   Specimen Description BLOOD RIGHT ANTECUBITAL  Final   Special Requests BOTTLES DRAWN AEROBIC AND ANAEROBIC 5CC  Final   Culture   Final            BLOOD CULTURE RECEIVED NO GROWTH TO DATE CULTURE WILL BE HELD FOR 5 DAYS BEFORE ISSUING A FINAL NEGATIVE REPORT Performed at [facility]    Report Status PENDING  Incomplete  Blood Culture (routine x 2)     Status: None (Preliminary result)   Collection Time: 05/27/14 10:19 AM  Result Value Ref Range Status   Specimen Description BLOOD LEFT WRIST  Final   Special Requests BOTTLES DRAWN AEROBIC AND ANAEROBIC 3CC  Final   Culture   Final           BLOOD CULTURE RECEIVED NO GROWTH TO DATE CULTURE WILL BE HELD FOR 5 DAYS BEFORE ISSUING A FINAL NEGATIVE REPORT Performed at [facility]    Report Status PENDING  Incomplete  Body fluid culture     Status: None (Preliminary result)   Collection Time: 05/27/14 12:19 PM  Result Value Ref Range Status   Specimen Description PERITONEAL CAVITY  Final   Special Requests NONE  Final   Gram Stain   Final    FEW WBC PRESENT, PREDOMINANTLY PMN NO ORGANISMS SEEN Performed at [facility]    Culture   Final    NO GROWTH 2 DAYS Performed at [facility]    Report Status PENDING  Incomplete     Labs: Basic Metabolic Panel:  Recent Labs Lab 05/27/14 1013 05/28/14 0707  NA 132* 133*  K 2.6* 3.3*  CL 95* 102  CO2 30 26  GLUCOSE 95 128*  BUN <5* <5*  CREATININE 0.81 0.59  CALCIUM 7.2* 7.7*  MG 1.3* 2.2   Liver Function Tests:  Recent Labs Lab 05/27/14 1013 05/28/14 0707  AST 78* 57*  ALT 36* 24  ALKPHOS 403* 260*  BILITOT 4.2* 4.6*  PROT 5.8* 4.9*  ALBUMIN 2.5* 2.9*   No results for input(s): LIPASE, AMYLASE in the last 168 hours.  Recent Labs Lab 05/27/14 1013  AMMONIA 60*   CBC:  Recent Labs Lab 05/27/14 1013 05/27/14 2200 05/28/14 0707 05/29/14 1620  WBC 1.3* 1.1* 1.4* 3.8*  NEUTROABS 0.6*  --  0.8* 3.0  HGB 8.5* 6.7* 8.9* 10.4*  HCT 26.2* 20.9* 27.6* 32.2*  MCV 90.7 90.9 91.7 92.3  PLT 73* 40* 44* 41*   Cardiac Enzymes: No results for input(s): CKTOTAL, CKMB,  CKMBINDEX, TROPONINI in the last 168 hours. BNP: BNP (last 3 results) No results for input(s): BNP in the last 8760 hours.  ProBNP (last 3 results) No results for input(s): PROBNP in the last 8760 hours.  CBG: No results for  input(s): GLUCAP in the last 168 hours.     SignedRhetta Mura  [facility] 05/29/2014, 6:07 PM

## 2014-05-29 NOTE — Progress Notes (Signed)
    Progress Note   Subjective  feels okay today. Having 2-3 loose BMs on lactulose   Objective   Vital signs in last 24 hours: Temp:  [97.4 F (36.3 C)-98.3 F (36.8 C)] 97.6 F (36.4 C) (05/01 0818) Pulse Rate:  [61-83] 61 (05/01 0622) Resp:  [16-18] 18 (05/01 0622) BP: (90-113)/(55-73) 99/66 mmHg (05/01 0658) SpO2:  [96 %-100 %] 100 % (05/01 0622) Last BM Date: 05/29/14 General:    Black female in NAD Abdomen:  Soft, nontender, mildly distended.  Normal bowel sounds. Extremities:  Without edema. Neurologic:  Alert and oriented,  grossly normal neurologically. Psych:  Cooperative. Normal mood and affect.  Lab Results:  Recent Labs  05/27/14 1013 05/27/14 2200 05/28/14 0707  WBC 1.3* 1.1* 1.4*  HGB 8.5* 6.7* 8.9*  HCT 26.2* 20.9* 27.6*  PLT 73* 40* 44*   BMET  Recent Labs  05/27/14 1013 05/28/14 0707  NA 132* 133*  K 2.6* 3.3*  CL 95* 102  CO2 30 26  GLUCOSE 95 128*  BUN <5* <5*  CREATININE 0.81 0.59  CALCIUM 7.2* 7.7*   LFT  Recent Labs  05/28/14 0707  PROT 4.9*  ALBUMIN 2.9*  AST 57*  ALT 24  ALKPHOS 260*  BILITOT 4.6*   PT/INR  Recent Labs  05/27/14 2000  LABPROT 20.6*  INR 1.75*     Assessment / Plan:   701. 24 year old female with autoimmune hepatitis / PSC overlap syndrome [facility]. Patient [facility]. Now admitted with abdominal pain and fever of 103. She is being treated for sepsis, on Primaxin and Vanco. Empiric fluconazole started as well. Blood cultures negative day 2. Urine culture negative. Ascitic fluid culture negative so far and no SBP based on fluid cell count.  Abdominal pain improved following removal of 4 liter of ascitic fluid this admission. Afebrile now. Patient believes pain coming from umbilical hernia which she wants repaired. Explained that she is at high risk for surgery given liver disease. Continue supportive care for now.    - Imuran on hold because of  neutropenia.  When WBC greater than 3,000, restart Imuran at lower dose - 75mg  daily. - She is on stress dose of prednisone.  Should resume home dose upon discharge.  - Patient needs to [facility] Dr. Carmon Sailsarline (she isn't sure)   2. Normocytic anemia, hgb fell from 8.5(baseline) on admission  to 6.7.  No active bleeding. No signs of recent variceal bleeding at time of EGD with banding 05/20/14. Maybe the 6.7 was erroneous as hgb increased 2grams with just one unit of blood . No plans for endoscopic workup at this time.     LOS: 2 days   Melanie Conley  05/29/2014, 9:07 AM

## 2014-05-29 NOTE — Care Management Note (Signed)
Case Management Note  Patient Details  Name: Melanie Conley MRN: [id number] Date of Birth: 09-04-1990  Subjective/Objective:    24 y/o f admitted w/Bacterial peritonitis.                Action/Plan: Monitor progress for d/c plans. No anticipated d/c needs.   Expected Discharge Date:   (UNKNOWN)               Expected Discharge Plan:  Home/Self Care  In-House Referral:     Discharge planning Services  CM Consult  Post Acute Care Choice:    Choice offered to:     DME Arranged:    DME Agency:     HH Arranged:    HH Agency:     Status of Service:     Medicare Important Message Given:    Date Medicare IM Given:    Medicare IM give by:    Date Additional Medicare IM Given:    Additional Medicare Important Message give by:     If discussed at Long Length of Stay Meetings, dates discussed:    Additional Comments:  Lanier ClamMahabir, Yovanni Frenette, RN 05/29/2014, 5:25 PM

## 2014-05-29 NOTE — Progress Notes (Signed)
Pt discharged to home. DC instructions given, teach back utilized. No concerns voiced. Pt encouraged to stop by pharmacy and pick up meds. Voiced understanding. Left unit in wheelchair pushed by nurse tech. Left in good condition. Vwilliams,rn.

## 2014-05-30 LAB — BODY FLUID CULTURE: Culture: NO GROWTH

## 2014-05-30 LAB — TYPE AND SCREEN
ABO/RH(D): B POS
Antibody Screen: NEGATIVE
Unit division: 0

## 2014-06-02 LAB — CULTURE, BLOOD (ROUTINE X 2)
Culture: NO GROWTH
Culture: NO GROWTH

## 2014-06-03 ENCOUNTER — Inpatient Hospital Stay (HOSPITAL_COMMUNITY)
Admission: EM | Admit: 2014-06-03 | Discharge: 2014-06-08 | DRG: 853 | Disposition: A | Discharge status: Other Acute Inpatient Hospital | Payer: BC Managed Care – PPO | Attending: Internal Medicine | Admitting: Internal Medicine

## 2014-06-03 ENCOUNTER — Encounter (HOSPITAL_COMMUNITY): Payer: Self-pay | Admitting: Emergency Medicine

## 2014-06-03 DIAGNOSIS — D709 Neutropenia, unspecified: Secondary | ICD-10-CM | POA: Diagnosis present

## 2014-06-03 DIAGNOSIS — K652 Spontaneous bacterial peritonitis: Secondary | ICD-10-CM

## 2014-06-03 DIAGNOSIS — R112 Nausea with vomiting, unspecified: Secondary | ICD-10-CM | POA: Diagnosis present

## 2014-06-03 DIAGNOSIS — R188 Other ascites: Secondary | ICD-10-CM | POA: Diagnosis present

## 2014-06-03 DIAGNOSIS — K754 Autoimmune hepatitis: Secondary | ICD-10-CM | POA: Diagnosis present

## 2014-06-03 DIAGNOSIS — N390 Urinary tract infection, site not specified: Secondary | ICD-10-CM | POA: Diagnosis not present

## 2014-06-03 DIAGNOSIS — A599 Trichomoniasis, unspecified: Secondary | ICD-10-CM | POA: Diagnosis present

## 2014-06-03 DIAGNOSIS — K7460 Unspecified cirrhosis of liver: Secondary | ICD-10-CM | POA: Diagnosis present

## 2014-06-03 DIAGNOSIS — B952 Enterococcus as the cause of diseases classified elsewhere: Secondary | ICD-10-CM | POA: Diagnosis not present

## 2014-06-03 DIAGNOSIS — R109 Unspecified abdominal pain: Secondary | ICD-10-CM | POA: Diagnosis present

## 2014-06-03 DIAGNOSIS — R17 Unspecified jaundice: Secondary | ICD-10-CM | POA: Insufficient documentation

## 2014-06-03 DIAGNOSIS — A419 Sepsis, unspecified organism: Secondary | ICD-10-CM | POA: Diagnosis present

## 2014-06-03 DIAGNOSIS — Z7952 Long term (current) use of systemic steroids: Secondary | ICD-10-CM

## 2014-06-03 DIAGNOSIS — E8809 Other disorders of plasma-protein metabolism, not elsewhere classified: Secondary | ICD-10-CM | POA: Diagnosis present

## 2014-06-03 DIAGNOSIS — D61818 Other pancytopenia: Secondary | ICD-10-CM | POA: Insufficient documentation

## 2014-06-03 DIAGNOSIS — M351 Other overlap syndromes: Secondary | ICD-10-CM | POA: Diagnosis present

## 2014-06-03 DIAGNOSIS — R509 Fever, unspecified: Secondary | ICD-10-CM

## 2014-06-03 DIAGNOSIS — I959 Hypotension, unspecified: Secondary | ICD-10-CM | POA: Diagnosis present

## 2014-06-03 DIAGNOSIS — K766 Portal hypertension: Secondary | ICD-10-CM | POA: Diagnosis present

## 2014-06-03 DIAGNOSIS — Z7682 Awaiting organ transplant status: Secondary | ICD-10-CM

## 2014-06-03 DIAGNOSIS — K650 Generalized (acute) peritonitis: Secondary | ICD-10-CM | POA: Diagnosis present

## 2014-06-03 DIAGNOSIS — I8510 Secondary esophageal varices without bleeding: Secondary | ICD-10-CM | POA: Diagnosis present

## 2014-06-03 DIAGNOSIS — E876 Hypokalemia: Secondary | ICD-10-CM | POA: Diagnosis not present

## 2014-06-03 MED ORDER — ONDANSETRON HCL 4 MG/2ML IJ SOLN
4.0000 mg | Freq: Once | INTRAMUSCULAR | Status: AC
  Administered 2014-06-03: 4 mg via INTRAVENOUS
  Filled 2014-06-03: qty 2

## 2014-06-03 NOTE — ED Provider Notes (Signed)
CSN: [id number]     Arrival date & time 06/03/14  2321 History   First MD Initiated Contact with Patient 06/03/14 2355     This chart was scribed for Dione Boozeavid Nik Gorrell, MD by Arlan OrganAshley Leger, ED Scribe. This patient was seen in room A03C/A03C and the patient's care was started 11:57 PM.   Chief Complaint  Patient presents with  . Nausea    The patient is complaining of N/V, diarrhea and generalized pain for three days.  Patient has not taken anything and rates her pain 10/10 generalizzed.  . Emesis  . Diarrhea   The history is provided by the patient. No language interpreter was used.    HPI Comments: Melanie Conley is a 24 y.o. female with a PMHx of liver disease, hepatitis, and esophageal varices who presents to the Emergency Department complaining of constant, ongoing, unchanged generalized body aches x 3 days. Pain is described as "pounding" and currently rated 12/10. She also reports a R sided HA described as "burning" and rated 12/10. Melanie Conley mentions chills, intermittent diaphoresis, nausea, and 1 episode of vomiting earlier today. She has not attempted any OTC medications or home remedies to help manage symptoms. No recent cough, dysuria, hematuria, urinary frequency, or urinary urgency. Pt has not resumed Imuran medication since last visit. No known allergies to medications.  Past Medical History  Diagnosis Date  . Liver disease   . Hepatitis   . Anemia   . Esophageal varices    Past Surgical History  Procedure Laterality Date  . Gastric banding port revision    . Tonsillectomy    . Esophagogastroduodenoscopy  07/22/2011    Procedure: ESOPHAGOGASTRODUODENOSCOPY (EGD);  Surgeon: Graylin ShiverSalem F Ganem, MD;  [facility];  Service: Endoscopy;  Laterality: N/A;   History reviewed. No pertinent family history. History  Substance Use Topics  . Smoking status: Never Smoker   . Smokeless tobacco: Never Used  . Alcohol Use: No   OB History    No data available     Review of  Systems  Constitutional: Positive for chills and diaphoresis. Negative for fever.  Respiratory: Negative for cough and shortness of breath.   Cardiovascular: Negative for chest pain.  Gastrointestinal: Positive for nausea and vomiting. Negative for abdominal pain.  Genitourinary: Negative for dysuria, urgency, frequency, hematuria and difficulty urinating.  Musculoskeletal: Positive for myalgias.  Skin: Negative for rash.  Neurological: Positive for headaches.  Psychiatric/Behavioral: Negative for confusion.      Allergies  Review of patient's allergies indicates no known allergies.  Home Medications   Prior to Admission medications   Medication Sig Start Date End Date Taking? Authorizing Provider  amoxicillin-clavulanate (AUGMENTIN) 875-125 MG per tablet Take 1 tablet by mouth 2 (two) times daily. 05/29/14   Rhetta MuraJai-Gurmukh Samtani, MD  furosemide (LASIX) 40 MG tablet Take 40-80 mg by mouth daily.     Historical Provider, MD  lactulose (CHRONULAC) 10 GM/15ML solution Take 30 mLs (20 g total) by mouth 2 (two) times daily. 05/29/14   Rhetta MuraJai-Gurmukh Samtani, MD  ondansetron (ZOFRAN) 4 MG tablet Take 1 tablet (4 mg total) by mouth every 6 (six) hours as needed for nausea. Patient not taking: Reported on 04/07/2014 01/16/14   Henderson CloudEstela Y Hernandez Acosta, MD  oxyCODONE (OXY IR/ROXICODONE) 5 MG immediate release tablet Take 1-2 tablets (5-10 mg total) by mouth every 4 (four) hours as needed for moderate pain. Patient not taking: Reported on 04/07/2014 01/16/14   Henderson CloudEstela Y Hernandez Acosta, MD  predniSONE (DELTASONE) 5  MG tablet Take 5 mg by mouth daily.    Historical Provider, MD  propranolol (INDERAL LA) 60 MG 24 hr capsule Take 60 mg by mouth daily.    Historical Provider, MD  spironolactone (ALDACTONE) 25 MG tablet Take 100 mg by mouth daily.     Historical Provider, MD  ursodiol (ACTIGALL) 300 MG capsule Take 600 mg by mouth daily.     Historical Provider, MD  Vitamin D, Ergocalciferol, (DRISDOL) 50000  UNITS CAPS capsule Take 1 capsule by mouth every 7 (seven) days. On Wednesdays 02/25/14 02/25/15  Historical Provider, MD   Triage Vitals: BP 113/68 mmHg  Pulse 96  Temp(Src) 101.2 F (38.4 C) (Oral)  Resp 22  SpO2 96%  LMP  (LMP Unknown)   Physical Exam  Constitutional: She is oriented to person, place, and time. She appears well-developed and well-nourished. No distress.  Appears uncomfortable   HENT:  Head: Normocephalic and atraumatic.  Eyes: EOM are normal. Pupils are equal, round, and reactive to light. Scleral icterus is present.  Moderately icteric  Fundi normal  Neck: Normal range of motion. Neck supple. No JVD present.  Cardiovascular: Normal rate, regular rhythm and normal heart sounds.   No murmur heard. Pulmonary/Chest: Effort normal and breath sounds normal. She has no wheezes. She has no rales. She exhibits tenderness.  Chest diffusely tender  Abdominal: Soft. Bowel sounds are normal. She exhibits no distension and no mass. There is tenderness. There is no guarding.  Diffusely tender with bowel sounds decreased  Musculoskeletal: Normal range of motion. She exhibits tenderness. She exhibits no edema.  Diffusely tender  Lymphadenopathy:    She has no cervical adenopathy.  Neurological: She is alert and oriented to person, place, and time. No cranial nerve deficit. She exhibits normal muscle tone. Coordination normal.  Skin: Skin is warm and dry. No rash noted.  Psychiatric: She has a normal mood and affect.  Nursing note and vitals reviewed.   ED Course  Procedures (including critical care time)  DIAGNOSTIC STUDIES: Oxygen Saturation is 96% on RA, adequate by my interpretation.    COORDINATION OF CARE: 12:07 AM- Will give fluids, Reglan, Benadryl, and Zofran. Will order CXR, protime-INR, i-stat CG4 lactic acid, urinalysis, urine preg, CBC, CMP, and lipase. Discussed treatment plan with pt at bedside and pt agreed to plan.     Labs Review Results for orders  placed or performed during the hospital encounter of 06/03/14  CBC with Differential  Result Value Ref Range   WBC 2.6 (L) 4.0 - 10.5 K/uL   RBC 3.74 (L) 3.87 - 5.11 MIL/uL   Hemoglobin 10.9 (L) 12.0 - 15.0 g/dL   HCT 16.1 (L) 09.6 - 04.5 %   MCV 88.5 78.0 - 100.0 fL   MCH 29.1 26.0 - 34.0 pg   MCHC 32.9 30.0 - 36.0 g/dL   RDW 40.9 (H) 81.1 - 91.4 %   Platelets 49 (L) 150 - 400 K/uL   Neutrophils Relative % 38 (L) 43 - 77 %   Lymphocytes Relative 46 12 - 46 %   Monocytes Relative 15 (H) 3 - 12 %   Eosinophils Relative 0 0 - 5 %   Basophils Relative 1 0 - 1 %   Neutro Abs 1.0 (L) 1.7 - 7.7 K/uL   Lymphs Abs 1.2 0.7 - 4.0 K/uL   Monocytes Absolute 0.4 0.1 - 1.0 K/uL   Eosinophils Absolute 0.0 0.0 - 0.7 K/uL   Basophils Absolute 0.0 0.0 - 0.1 K/uL  RBC Morphology POLYCHROMASIA PRESENT    WBC Morphology INCREASED BANDS (>20% BANDS)   Comprehensive metabolic panel  Result Value Ref Range   Sodium 137 135 - 145 mmol/L   Potassium 3.9 3.5 - 5.1 mmol/L   Chloride 105 101 - 111 mmol/L   CO2 23 22 - 32 mmol/L   Glucose, Bld 88 70 - 99 mg/dL   BUN 14 6 - 20 mg/dL   Creatinine, Ser 9.600.98 0.44 - 1.00 mg/dL   Calcium 7.8 (L) 8.9 - 10.3 mg/dL   Total Protein 5.9 (L) 6.5 - 8.1 g/dL   Albumin 2.4 (L) 3.5 - 5.0 g/dL   AST 88 (H) 15 - 41 U/L   ALT 51 14 - 54 U/L   Alkaline Phosphatase 395 (H) 38 - 126 U/L   Total Bilirubin 12.4 (H) 0.3 - 1.2 mg/dL   GFR calc non Af Amer >60 >60 mL/min   GFR calc Af Amer >60 >60 mL/min   Anion gap 9 5 - 15  Lipase, blood  Result Value Ref Range   Lipase 42 22 - 51 U/L  Protime-INR  Result Value Ref Range   Prothrombin Time 20.8 (H) 11.6 - 15.2 seconds   INR 1.77 (H) 0.00 - 1.49  I-Stat CG4 Lactic Acid, ED  Result Value Ref Range   Lactic Acid, Venous 1.55 0.5 - 2.0 mmol/L    Imaging Review Dg Chest Port 1 View  06/04/2014   CLINICAL DATA:  Headache and abdomen pain for 2 days  EXAM: PORTABLE CHEST - 1 VIEW  COMPARISON:  05/27/2014  FINDINGS: A  single AP portable view of the chest demonstrates no focal airspace consolidation or alveolar edema. The lungs are grossly clear. There is no large effusion or pneumothorax. Cardiac and mediastinal contours appear unremarkable.  IMPRESSION: No active disease.   Electronically Signed   By: Ellery Plunkaniel R Mitchell M.D.   On: 06/04/2014 02:07    MDM   Final diagnoses:  Fever  Fever, unspecified fever cause  Hepatic cirrhosis, unspecified hepatic cirrhosis type  Jaundice  Pancytopenia    Fever in patient with cirrhosis. Old records are reviewed and she had been hospitalized with fever with cultures negative, discharged about one week ago. She had been sent home with a prescription for Augmentin. Pancytopenia had been noted in the hospital and she was told to stop taking her Imuran and she states that she has not resumed taking it. She is difficult to assess because she is tender everywhere but abdomen is not any more tender than any other part of her body. Laboratory workup is significant for worsening of liver enzymes and marked increase in bilirubin compared with that discharge. WBC has also come down but she does have inadequate total absolute neutrophil count. She is somewhat immune compromised because of cirrhosis so it is elected to treat her fever is possible infection. She shows no signs of sepsis. Case is discussed with Dr. Lovell SheehanJenkins of [facility] who agrees to admit the patient.  I personally performed the services described in this documentation, which was scribed in my presence. The recorded information has been reviewed and is accurate.      Dione Boozeavid Alainna Stawicki, MD 06/04/14 [phone]

## 2014-06-03 NOTE — ED Notes (Signed)
The patient is complaining of N/V, diarrhea and generalized pain for three days.  Patient has not taken anything and rates her pain 10/10 generalizzed.  The patient comes from home, she called [facility] to be transported here.  The patient does have a history of cirrhosis of the liver.

## 2014-06-03 NOTE — ED Notes (Signed)
The [facility] two days ago and they took out four liters out of her abdomen.

## 2014-06-04 ENCOUNTER — Emergency Department (HOSPITAL_COMMUNITY): Payer: BC Managed Care – PPO

## 2014-06-04 ENCOUNTER — Encounter (HOSPITAL_COMMUNITY): Payer: Self-pay

## 2014-06-04 ENCOUNTER — Inpatient Hospital Stay (HOSPITAL_COMMUNITY): Payer: BC Managed Care – PPO

## 2014-06-04 DIAGNOSIS — E8809 Other disorders of plasma-protein metabolism, not elsewhere classified: Secondary | ICD-10-CM | POA: Diagnosis present

## 2014-06-04 DIAGNOSIS — R509 Fever, unspecified: Secondary | ICD-10-CM | POA: Insufficient documentation

## 2014-06-04 DIAGNOSIS — N390 Urinary tract infection, site not specified: Secondary | ICD-10-CM | POA: Diagnosis not present

## 2014-06-04 DIAGNOSIS — A419 Sepsis, unspecified organism: Principal | ICD-10-CM

## 2014-06-04 DIAGNOSIS — K766 Portal hypertension: Secondary | ICD-10-CM | POA: Diagnosis present

## 2014-06-04 DIAGNOSIS — R100 Acute abdomen: Secondary | ICD-10-CM | POA: Diagnosis not present

## 2014-06-04 DIAGNOSIS — R17 Unspecified jaundice: Secondary | ICD-10-CM | POA: Diagnosis not present

## 2014-06-04 DIAGNOSIS — B952 Enterococcus as the cause of diseases classified elsewhere: Secondary | ICD-10-CM | POA: Diagnosis not present

## 2014-06-04 DIAGNOSIS — K652 Spontaneous bacterial peritonitis: Secondary | ICD-10-CM | POA: Diagnosis not present

## 2014-06-04 DIAGNOSIS — K754 Autoimmune hepatitis: Secondary | ICD-10-CM | POA: Diagnosis not present

## 2014-06-04 DIAGNOSIS — K650 Generalized (acute) peritonitis: Secondary | ICD-10-CM | POA: Diagnosis present

## 2014-06-04 DIAGNOSIS — D709 Neutropenia, unspecified: Secondary | ICD-10-CM

## 2014-06-04 DIAGNOSIS — Z7952 Long term (current) use of systemic steroids: Secondary | ICD-10-CM | POA: Diagnosis not present

## 2014-06-04 DIAGNOSIS — I959 Hypotension, unspecified: Secondary | ICD-10-CM | POA: Diagnosis present

## 2014-06-04 DIAGNOSIS — D61818 Other pancytopenia: Secondary | ICD-10-CM | POA: Diagnosis not present

## 2014-06-04 DIAGNOSIS — I8510 Secondary esophageal varices without bleeding: Secondary | ICD-10-CM | POA: Diagnosis present

## 2014-06-04 DIAGNOSIS — R112 Nausea with vomiting, unspecified: Secondary | ICD-10-CM | POA: Diagnosis present

## 2014-06-04 DIAGNOSIS — A599 Trichomoniasis, unspecified: Secondary | ICD-10-CM | POA: Diagnosis present

## 2014-06-04 DIAGNOSIS — E876 Hypokalemia: Secondary | ICD-10-CM | POA: Diagnosis not present

## 2014-06-04 DIAGNOSIS — R188 Other ascites: Secondary | ICD-10-CM | POA: Diagnosis present

## 2014-06-04 DIAGNOSIS — M351 Other overlap syndromes: Secondary | ICD-10-CM | POA: Diagnosis present

## 2014-06-04 DIAGNOSIS — K7460 Unspecified cirrhosis of liver: Secondary | ICD-10-CM | POA: Diagnosis present

## 2014-06-04 DIAGNOSIS — Z7682 Awaiting organ transplant status: Secondary | ICD-10-CM | POA: Diagnosis not present

## 2014-06-04 LAB — CBC WITH DIFFERENTIAL/PLATELET
Basophils Absolute: 0 10*3/uL (ref 0.0–0.1)
Basophils Relative: 1 % (ref 0–1)
EOS PCT: 0 % (ref 0–5)
Eosinophils Absolute: 0 10*3/uL (ref 0.0–0.7)
HEMATOCRIT: 33.1 % — AB (ref 36.0–46.0)
Hemoglobin: 10.9 g/dL — ABNORMAL LOW (ref 12.0–15.0)
LYMPHS ABS: 1.2 10*3/uL (ref 0.7–4.0)
Lymphocytes Relative: 46 % (ref 12–46)
MCH: 29.1 pg (ref 26.0–34.0)
MCHC: 32.9 g/dL (ref 30.0–36.0)
MCV: 88.5 fL (ref 78.0–100.0)
MONO ABS: 0.4 10*3/uL (ref 0.1–1.0)
MONOS PCT: 15 % — AB (ref 3–12)
Neutro Abs: 1 10*3/uL — ABNORMAL LOW (ref 1.7–7.7)
Neutrophils Relative %: 38 % — ABNORMAL LOW (ref 43–77)
Platelets: 49 10*3/uL — ABNORMAL LOW (ref 150–400)
RBC: 3.74 MIL/uL — ABNORMAL LOW (ref 3.87–5.11)
RDW: 19.4 % — AB (ref 11.5–15.5)
WBC Morphology: INCREASED
WBC: 2.6 10*3/uL — ABNORMAL LOW (ref 4.0–10.5)

## 2014-06-04 LAB — URINALYSIS, ROUTINE W REFLEX MICROSCOPIC
Glucose, UA: NEGATIVE mg/dL
Hgb urine dipstick: NEGATIVE
KETONES UR: NEGATIVE mg/dL
NITRITE: NEGATIVE
Protein, ur: NEGATIVE mg/dL
Specific Gravity, Urine: 1.024 (ref 1.005–1.030)
UROBILINOGEN UA: 1 mg/dL (ref 0.0–1.0)
pH: 7 (ref 5.0–8.0)

## 2014-06-04 LAB — URINE MICROSCOPIC-ADD ON

## 2014-06-04 LAB — BODY FLUID CELL COUNT WITH DIFFERENTIAL
Eos, Fluid: 0 %
Lymphs, Fluid: 17 %
MONOCYTE-MACROPHAGE-SEROUS FLUID: 83 % (ref 50–90)
Neutrophil Count, Fluid: 0 % (ref 0–25)
Total Nucleated Cell Count, Fluid: 43 cu mm (ref 0–1000)

## 2014-06-04 LAB — COMPREHENSIVE METABOLIC PANEL
ALBUMIN: 2.4 g/dL — AB (ref 3.5–5.0)
ALT: 51 U/L (ref 14–54)
AST: 88 U/L — ABNORMAL HIGH (ref 15–41)
Alkaline Phosphatase: 395 U/L — ABNORMAL HIGH (ref 38–126)
Anion gap: 9 (ref 5–15)
BUN: 14 mg/dL (ref 6–20)
CHLORIDE: 105 mmol/L (ref 101–111)
CO2: 23 mmol/L (ref 22–32)
Calcium: 7.8 mg/dL — ABNORMAL LOW (ref 8.9–10.3)
Creatinine, Ser: 0.98 mg/dL (ref 0.44–1.00)
GFR calc Af Amer: >60 mL/min (ref >60)
GFR calc non Af Amer: >60 mL/min (ref >60)
Glucose, Bld: 88 mg/dL (ref 70–99)
POTASSIUM: 3.9 mmol/L (ref 3.5–5.1)
Sodium: 137 mmol/L (ref 135–145)
Total Bilirubin: 12.4 mg/dL — ABNORMAL HIGH (ref 0.3–1.2)
Total Protein: 5.9 g/dL — ABNORMAL LOW (ref 6.5–8.1)

## 2014-06-04 LAB — HEPATIC FUNCTION PANEL
ALBUMIN: 1.9 g/dL — AB (ref 3.5–5.0)
ALT: 39 U/L (ref 14–54)
AST: 69 U/L — AB (ref 15–41)
Alkaline Phosphatase: 319 U/L — ABNORMAL HIGH (ref 38–126)
Bilirubin, Direct: 7.1 mg/dL — ABNORMAL HIGH (ref 0.1–0.5)
Indirect Bilirubin: 4.3 mg/dL — ABNORMAL HIGH (ref 0.3–0.9)
TOTAL PROTEIN: 4.8 g/dL — AB (ref 6.5–8.1)
Total Bilirubin: 11.4 mg/dL — ABNORMAL HIGH (ref 0.3–1.2)

## 2014-06-04 LAB — LIPASE, BLOOD: LIPASE: 42 U/L (ref 22–51)

## 2014-06-04 LAB — I-STAT CG4 LACTIC ACID, ED: Lactic Acid, Venous: 1.55 mmol/L (ref 0.5–2.0)

## 2014-06-04 LAB — PROTIME-INR
INR: 1.77 — ABNORMAL HIGH (ref 0.00–1.49)
Prothrombin Time: 20.8 seconds — ABNORMAL HIGH (ref 11.6–15.2)

## 2014-06-04 LAB — CORTISOL: Cortisol, Plasma: >100 ug/dL

## 2014-06-04 MED ORDER — SODIUM CHLORIDE 0.9 % IV SOLN
1000.0000 mL | Freq: Once | INTRAVENOUS | Status: AC
  Administered 2014-06-04: 1000 mL via INTRAVENOUS

## 2014-06-04 MED ORDER — SODIUM CHLORIDE 0.9 % IV SOLN
1000.0000 mL | INTRAVENOUS | Status: DC
  Administered 2014-06-04 – 2014-06-05 (×2): 1000 mL via INTRAVENOUS

## 2014-06-04 MED ORDER — FENTANYL CITRATE (PF) 100 MCG/2ML IJ SOLN
25.0000 ug | Freq: Once | INTRAMUSCULAR | Status: AC
Start: 2014-06-04 — End: 2014-06-04
  Administered 2014-06-04: 25 ug via INTRAVENOUS
  Filled 2014-06-04: qty 2

## 2014-06-04 MED ORDER — SODIUM CHLORIDE 0.9 % IV SOLN
250.0000 mg | Freq: Four times a day (QID) | INTRAVENOUS | Status: DC
  Administered 2014-06-04 – 2014-06-07 (×12): 250 mg via INTRAVENOUS
  Filled 2014-06-04 (×16): qty 250

## 2014-06-04 MED ORDER — PIPERACILLIN-TAZOBACTAM 3.375 G IVPB 30 MIN
3.3750 g | Freq: Once | INTRAVENOUS | Status: AC
  Administered 2014-06-04: 3.375 g via INTRAVENOUS
  Filled 2014-06-04: qty 50

## 2014-06-04 MED ORDER — URSODIOL 300 MG PO CAPS
600.0000 mg | ORAL_CAPSULE | Freq: Every day | ORAL | Status: DC
  Administered 2014-06-04 – 2014-06-08 (×5): 600 mg via ORAL
  Filled 2014-06-04 (×5): qty 2

## 2014-06-04 MED ORDER — METOCLOPRAMIDE HCL 5 MG/ML IJ SOLN
10.0000 mg | Freq: Once | INTRAMUSCULAR | Status: AC
  Administered 2014-06-04: 10 mg via INTRAVENOUS
  Filled 2014-06-04: qty 2

## 2014-06-04 MED ORDER — VANCOMYCIN HCL IN DEXTROSE 1-5 GM/200ML-% IV SOLN
1000.0000 mg | Freq: Once | INTRAVENOUS | Status: AC
  Administered 2014-06-04: 1000 mg via INTRAVENOUS
  Filled 2014-06-04: qty 200

## 2014-06-04 MED ORDER — LIDOCAINE HCL (PF) 1 % IJ SOLN
INTRAMUSCULAR | Status: AC
  Filled 2014-06-04: qty 10

## 2014-06-04 MED ORDER — ONDANSETRON HCL 4 MG/2ML IJ SOLN: 4.0000 mg | Freq: Four times a day (QID) | INTRAMUSCULAR | Status: DC | PRN

## 2014-06-04 MED ORDER — BOOST / RESOURCE BREEZE PO LIQD
1.0000 | Freq: Three times a day (TID) | ORAL | Status: DC
  Administered 2014-06-04 – 2014-06-08 (×11): 1 via ORAL

## 2014-06-04 MED ORDER — PREDNISONE 5 MG PO TABS
5.0000 mg | ORAL_TABLET | Freq: Every day | ORAL | Status: DC
  Administered 2014-06-04 – 2014-06-08 (×5): 5 mg via ORAL
  Filled 2014-06-04 (×5): qty 1

## 2014-06-04 MED ORDER — LACTULOSE 10 GM/15ML PO SOLN
20.0000 g | Freq: Two times a day (BID) | ORAL | Status: DC
  Administered 2014-06-04 – 2014-06-06 (×5): 20 g via ORAL
  Filled 2014-06-04 (×6): qty 30

## 2014-06-04 MED ORDER — GADOBENATE DIMEGLUMINE 529 MG/ML IV SOLN
10.0000 mL | Freq: Once | INTRAVENOUS | Status: AC | PRN
Start: 2014-06-04 — End: 2014-06-04

## 2014-06-04 MED ORDER — DIPHENHYDRAMINE HCL 50 MG/ML IJ SOLN
25.0000 mg | Freq: Once | INTRAMUSCULAR | Status: AC
  Administered 2014-06-04: 25 mg via INTRAVENOUS
  Filled 2014-06-04: qty 1

## 2014-06-04 MED ORDER — ONDANSETRON HCL 4 MG PO TABS
4.0000 mg | ORAL_TABLET | Freq: Four times a day (QID) | ORAL | Status: DC | PRN
  Administered 2014-06-04: 4 mg via ORAL
  Filled 2014-06-04: qty 1

## 2014-06-04 MED ORDER — HYDROCORTISONE NA SUCCINATE PF 100 MG IJ SOLR
100.0000 mg | Freq: Three times a day (TID) | INTRAMUSCULAR | Status: DC
  Administered 2014-06-04: 100 mg via INTRAVENOUS
  Filled 2014-06-04 (×4): qty 2

## 2014-06-04 MED ORDER — SODIUM CHLORIDE 0.9 % IV SOLN
INTRAVENOUS | Status: AC
  Administered 2014-06-04: 11:00:00 via INTRAVENOUS

## 2014-06-04 MED ORDER — OXYCODONE HCL 5 MG PO TABS
5.0000 mg | ORAL_TABLET | ORAL | Status: DC | PRN
  Administered 2014-06-04 – 2014-06-05 (×4): 5 mg via ORAL
  Filled 2014-06-04 (×4): qty 1

## 2014-06-04 MED ORDER — ALBUMIN HUMAN 25 % IV SOLN
50.0000 g | Freq: Once | INTRAVENOUS | Status: DC
  Filled 2014-06-04: qty 200

## 2014-06-04 MED ORDER — HYDROCORTISONE NA SUCCINATE PF 100 MG IJ SOLR
50.0000 mg | Freq: Three times a day (TID) | INTRAMUSCULAR | Status: DC
  Administered 2014-06-04 – 2014-06-05 (×2): 50 mg via INTRAVENOUS
  Filled 2014-06-04 (×6): qty 1

## 2014-06-04 MED ORDER — ALUM & MAG HYDROXIDE-SIMETH 200-200-20 MG/5ML PO SUSP
30.0000 mL | Freq: Four times a day (QID) | ORAL | Status: DC | PRN
  Administered 2014-06-04: 30 mL via ORAL
  Filled 2014-06-04: qty 30

## 2014-06-04 MED ORDER — SODIUM CHLORIDE 0.9 % IV BOLUS (SEPSIS)
250.0000 mL | Freq: Once | INTRAVENOUS | Status: AC
  Administered 2014-06-04: 250 mL via INTRAVENOUS

## 2014-06-04 MED ORDER — VANCOMYCIN HCL IN DEXTROSE 750-5 MG/150ML-% IV SOLN
750.0000 mg | Freq: Two times a day (BID) | INTRAVENOUS | Status: DC
  Administered 2014-06-04 – 2014-06-07 (×6): 750 mg via INTRAVENOUS
  Filled 2014-06-04 (×7): qty 150

## 2014-06-04 NOTE — Consult Note (Addendum)
Referring Provider: [facility] Primary Care Physician:  [name],[name], MD Primary Gastroenterologist:  [name]; Dr. Arlyce DiceKaplan consulted 04/2014 in hosp  Reason for Consultation:   PBC  HPI: Melanie Conley is a 24 y.o. female with history of autoimmune hepatitis with PSC overlap syndrome who was followed by [facility] Piedad [facility]arling [facility]. Her cirrhosis is complicated by a history of varices and ascites with SBP. [facility] with findings of grade 2 esophageal varices. One band was placed. She was admitted on April 29 with abdominal pain, headache, and a temperature of 103. She felt better after paracentesis with removal of 4 L of fluid and no SBP was noted. She was treated for sepsis with Primaxin and vancomycin along with empiric fluconazole. Blood cultures were negative urine culture was negative. Her Imuran was on hold because of neutropenia and she was advised that when her white blood cell count became greater than 3000 she could restart her Imuran at a lower dose of 75 mg daily. She was discharged home on May 1 with instructions to continue low-dose prednisone she was sent home on 5 days of Augmentin. She states that the night of discharge she again began to have abdominal pain which has progressed. She has had nausea vomiting and diarrhea for 2 days and her fever yesterday was on April 30 her total bili was noted to be 4.6 and less evening he was noted to be 12.4. Alkaline phosphatase is currently 395 up from 260 on April 30.Marland Kitchen.   Past Medical History  Diagnosis Date  . Liver disease   . Hepatitis   . Anemia   . Esophageal varices     Past Surgical History  Procedure Laterality Date  . Gastric banding port revision    . Tonsillectomy    . Esophagogastroduodenoscopy  07/22/2011    Procedure: ESOPHAGOGASTRODUODENOSCOPY (EGD);  Surgeon: Graylin ShiverSalem F Ganem, MD;  [facility];  Service: Endoscopy;  Laterality: N/A;    Prior to Admission medications    Medication Sig Start Date End Date Taking? Authorizing Provider  amoxicillin-clavulanate (AUGMENTIN) 875-125 MG per tablet Take 1 tablet by mouth 2 (two) times daily. 05/29/14  Yes Rhetta MuraJai-Gurmukh Samtani, MD  furosemide (LASIX) 40 MG tablet Take 40-80 mg by mouth daily.    Yes Historical Provider, MD  lactulose (CHRONULAC) 10 GM/15ML solution Take 30 mLs (20 g total) by mouth 2 (two) times daily. 05/29/14  Yes Rhetta MuraJai-Gurmukh Samtani, MD  predniSONE (DELTASONE) 5 MG tablet Take 5 mg by mouth daily.   Yes Historical Provider, MD  propranolol (INDERAL LA) 60 MG 24 hr capsule Take 60 mg by mouth daily.   Yes Historical Provider, MD  spironolactone (ALDACTONE) 25 MG tablet Take 100 mg by mouth daily.    Yes Historical Provider, MD  ursodiol (ACTIGALL) 300 MG capsule Take 600 mg by mouth daily.    Yes Historical Provider, MD  Vitamin D, Ergocalciferol, (DRISDOL) 50000 UNITS CAPS capsule Take 1 capsule by mouth every 7 (seven) days. On Wednesdays 02/25/14 02/25/15 Yes Historical Provider, MD  ondansetron (ZOFRAN) 4 MG tablet Take 1 tablet (4 mg total) by mouth every 6 (six) hours as needed for nausea. Patient not taking: Reported on 04/07/2014 01/16/14   Henderson CloudEstela Y Hernandez Acosta, MD  oxyCODONE (OXY IR/ROXICODONE) 5 MG immediate release tablet Take 1-2 tablets (5-10 mg total) by mouth every 4 (four) hours as needed for moderate pain. Patient not taking: Reported on 04/07/2014 01/16/14  Henderson CloudEstela Y Hernandez Acosta, MD    Current Facility-Administered Medications  Medication Dose Route Frequency Provider Last Rate Last Dose  . 0.9 %  sodium chloride infusion  1,000 mL Intravenous Continuous Dione Boozeavid Glick, MD 125 mL/hr at 06/04/14 0126 1,000 mL at 06/04/14 0126  . 0.9 %  sodium chloride infusion   Intravenous Continuous Harvette Velora Heckler Jenkins, MD      . albumin human 25 % solution 50 g  50 g Intravenous Once Shanker Levora DredgeM Ghimire, MD      . alum & mag hydroxide-simeth (MAALOX/MYLANTA) 200-200-20 MG/5ML suspension 30 mL  30 mL  Oral Q6H PRN Harvette Velora Heckler Jenkins, MD      . feeding supplement (RESOURCE BREEZE) (RESOURCE BREEZE) liquid 1 Container  1 Container Oral TID BM Ron ParkerHarvette C Jenkins, MD   1 Container at 06/04/14 0934  . hydrocortisone sodium succinate (SOLU-CORTEF) 100 MG injection 100 mg  100 mg Intravenous Q8H Ron ParkerHarvette C Jenkins, MD   100 mg at 06/04/14 [id number]  . lactulose (CHRONULAC) 10 GM/15ML solution 20 g  20 g Oral BID Ron ParkerHarvette C Jenkins, MD   20 g at 06/04/14 0931  . ondansetron (ZOFRAN) tablet 4 mg  4 mg Oral Q6H PRN Ron ParkerHarvette C Jenkins, MD       Or  . ondansetron (ZOFRAN) injection 4 mg  4 mg Intravenous Q6H PRN Ron ParkerHarvette C Jenkins, MD      . oxyCODONE (Oxy IR/ROXICODONE) immediate release tablet 5 mg  5 mg Oral Q4H PRN Ron ParkerHarvette C Jenkins, MD      . predniSONE (DELTASONE) tablet 5 mg  5 mg Oral Daily Ron ParkerHarvette C Jenkins, MD   5 mg at 06/04/14 0931  . ursodiol (ACTIGALL) capsule 600 mg  600 mg Oral Daily Ron ParkerHarvette C Jenkins, MD   600 mg at 06/04/14 0931    Allergies as of 06/03/2014  . (No Known Allergies)    Family History  Problem Relation Age of Onset  . Adopted: Yes    History   Social History  . Marital Status: Single    Spouse Name: N/A  . Number of Children: N/A  . Years of Education: N/A   Occupational History  . Not on file.   Social History Main Topics  . Smoking status: Never Smoker   . Smokeless tobacco: Never Used  . Alcohol Use: No  . Drug Use: No  . Sexual Activity: Yes    Birth Control/ Protection: None   Other Topics Concern  . Not on file   Social History Narrative   Adopted so doesn't know family history      [facility]      Follow-up for cirrhosis secondary to AIH and PSC overlap syndrome.       History of Present Illness: Patient is a pleasant 24 year old [place] female who was diagnosed with liver disease at age 24 when she presented with a GI bleed. She has undergone liver biopsy and ERCP in 2003. 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  Her complications of cirrhosis include bleeding esophageal varicies, ascites, SBP and encephalopathy. [facility] but made status 7 due to a low MELD score and a good quality of life. We have been unable to reactivate her as an adult because she has not passed the psychosocial evaluation. She has undergone formal neuropsych testing. In the past, she has stated that she does not wish to undergo OLT even if she needs it. In  regards to her AIH, her serologies are negative and she has been on Imuran and prednisone since her diagnosis in 2003. Regarding her PSC, she has had one bout of cholangitis years ago per report. She denies a history of stenting or dominant biliary stricture or choledolcholithiasis. She was previously on high dose ursodiol.       She presents today with her Mom. I last saw her in Feb 2015. She has been hospitalized here at [facility] for abdominal pain/worsening ascites in 11/2013 and the again at [facility] in 12/2013 for SBP despite being on Levoquin prophylaxis. Her outside records from 12/2013 were reviewed in Care Everywhere. Ascites fluid showed 7866 nuc cells with 83% polys. Blood Ctx were negative. She had 3 days of IV Rocephin with IV albumin on D1 and D3 as well as 7 days of Augmentin. She also had a 2.7L paracentesis. Stool studies were neg (C diff and immunocompromised host panel). She also had a CT scan which was unremarkable for acute event- patent PV. She denies any worsening jaundice, pruritis, SOB, CP, increased abdominal girth, LE edema, melena, BRBPR, confusion, depression, nausea, vomiting, or diarrhea. Her abdominal pain has resolved and her ascites is well controlled on aldactone  and lasix  daily. She is following a low Na diet.      Cirrhosis Care:    1.HCC screen: MRI 09/06/13 and CT scan 12/13/13 cirrhosis, ascites, marked splenomegaly, no masses, biliary stricturing   2.Varicies surveillance: EGD 07/17/13 Grade II esophageal varices with  EVL X 2. Missed follow-up.   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    -- Liver biopsy 08/03/01 minute fragments bile duct proliferation and periductular fibrosis; hepatitis G1-2, S2; ERCP 09/23/01 Subtle changes/narrowing but no dominant strictures; MRCP images 10/08 showed focal narrowing in right hepatic system; serologies ANA neg ASMA neg, antiLKMI neg AMA neg IgG normal (low level + ASMA 1:20 in 2003)    -- Occult GI bleeding with many EGD's, VCE 11/09 normal, Colonoscopy 7/06 and ?2/10 with biopsies negative for IBD including TI.    -- seasonal allergies    -- s/p tonsilectomy    -- Immune to HAV and HBV (2007)    -- EBV IgG pos CMV IgG and IgM neg       Social History:    Lives near [place], [place] with parents sister and brother. Rhona is the middle child (she is adopted).   Her Mom teaches Ambulance person.    No tobacco or alcohol.    Devyne had been at [employer] studying early childhood development but is currently working in Warden/ranger at [employer].   Pets: 1 dog and 1 cat        Review of Systems: Gen: Has had fever and chills. CV: Denies chest pain, angina, palpitations, syncope, orthopnea, PND, peripheral edema, and claudication. Resp: Denies dyspnea at rest, dyspnea with exercise, cough, sputum, wheezing, coughing up blood, and pleurisy. GI: Has abdominal pain, nausea, vomiting and diarrhea GU : Denies urinary burning, blood in urine, urinary frequency, urinary hesitancy, nocturnal urination, and urinary incontinence. MS: Denies joint pain, limitation of movement, and swelling, stiffness, low back pain, extremity pain. Denies muscle weakness, cramps, atrophy.  Derm: Denies rash, i dry  skin,  hives, moles, warts, or unhealing ulcers.  Psych: Denies depression, anxiety, memory loss, suicidal ideation, hallucinations, paranoia, and confusion. Heme: Denies bruising, bleeding, and enlarged lymph nodes. Neuro:  Denies any headaches, dizziness, paresthesias. Endo:  Denies any problems with DM, thyroid, adrenal function.  Physical Exam: Vital signs in last 24 hours: Temp:  [98.3 F (36.8 C)-101.2 F (38.4 C)] 98.6 F (37 C) (05/07 0506) Pulse Rate:  [84-96] 84 (05/07 0506) Resp:  [15-26] 15 (05/07 0506) BP: (87-113)/(49-68) 90/49 mmHg (05/07 0506) SpO2:  [96 %-99 %] 99 % (05/07 0506) Weight:  [120 lb 12.8 oz (54.795 kg)] 120 lb 12.8 oz (54.795 kg) (05/07 0508) Last BM Date: 06/04/14 General:   Alert,  Well-developed, well-nourished, pleasant and cooperative black female in NAD Head:  Normocephalic and atraumatic. Eyes:  Sclera clear, mildly icteric,  Conjunctiva pink. Ears:  Normal auditory acuity. Nose:  No deformity, discharge,  or lesions. Mouth:  No deformity or lesions.   Neck:  Supple; no masses or thyromegaly. Lungs:  Clear throughout to auscultation.   No wheezes, crackles, or rhonchi.  Heart:  Regular rate and rhythm; no murmurs, clicks, rubs,  or gallops. Abdomen:  Soft, moderately distended,tender diffusely across upper abdomen, BS active,nonpalp mass or hsm.   Rectal:  Deferred  Msk:  Symmetrical without gross deformities. . Pulses:  Normal pulses noted. Extremities:  Without clubbing or edema. Neurologic: Alert and  oriented x4;  grossly normal neurologically. Skin:  Intact without significant lesions or rashes.. Psych: Alert and cooperative. Normal mood and affect.   Recent Labs  06/03/14 2357  WBC 2.6*  HGB 10.9*  HCT 33.1*  PLT 49*   BMET  Recent Labs  06/03/14 2357  NA 137  K 3.9  CL 105  CO2 23  GLUCOSE 88  BUN 14  CREATININE 0.98  CALCIUM 7.8*   LFT  Recent Labs  06/03/14 2357  PROT 5.9*  ALBUMIN 2.4*  AST 88*  ALT 51   ALKPHOS 395*  BILITOT 12.4*   PT/INR  Recent Labs  06/04/14 0016  LABPROT 20.8*  INR 1.77*     Studies/Results: Dg Chest Port 1 View  06/04/2014   CLINICAL DATA:  Headache and abdomen pain for 2 days  EXAM: PORTABLE CHEST - 1 VIEW  COMPARISON:  05/27/2014  FINDINGS: A single AP portable view of the chest demonstrates no focal airspace consolidation or alveolar edema. The lungs are grossly clear. There is no large effusion or pneumothorax. Cardiac and mediastinal contours appear unremarkable.  IMPRESSION: No active disease.   Electronically Signed   By: Ellery Plunk M.D.   On: 06/04/2014 02:07    IMPRESSION/PLAN: 24 year old female with autoimmune hepatitis/PSC overlap syndrome followed by [facility]. Cirrhosis complicated by portal hypertension with esophageal  varices, ascites/SBP. Patient recently discharged home on May 1 on Augmentin for 5 days, low-dose prednisone, and lactulose. Imuran remains on hold due to low white blood cell count. Readmitted with fevers, abdominal pain, and elevation of bilirubin. Symptoms concerning for possible cholangitis. Patient currently on Zosyn and vancomycin. MRCP ordered. Trend LFTs. Paracentesis has been ordered as well. Will follow.   Hvozdovic, Moise Boring 06/04/2014,  Pager [phone]   ________________________________________________________________________  [facility] MD note:  I personally examined the patient, reviewed the data and agree with the assessment and plan described above.  Very nice young woman with cirrhosis, PSC/AIH overlap. She tells me she is currently on transplant list at [facility].  Back with fevers abd pain and her T  bili has significantly increased.  I'm concerned more about biliary issue now than bacterial peritonitis but agree with diagnostic tap, broad spectrum abx.  We are ordering MRI with MRCP.    She should remain on broad spect antibiotics for now Zenaida Niece, zosyn were both given as 1 time dose, and she's now getting primaxin  every 6 hours).   Rob Bunting, MD [facility] Gastroenterology Pager [phone]

## 2014-06-04 NOTE — Progress Notes (Addendum)
ANTIBIOTIC CONSULT NOTE - INITIAL  Pharmacy Consult for Primaxin/vancomycin Indication: intra-abdominal infection  No Known Allergies  Patient Measurements: Height: 4\' 11"  (149.9 cm) Weight: 120 lb 12.8 oz (54.795 kg) IBW/kg (Calculated) : 43.2 Adjusted Body Weight:   Vital Signs: Temp: 98.6 F (37 C) (05/07 0506) Temp Source: Oral (05/07 0506) BP: 90/49 mmHg (05/07 0506) Pulse Rate: 84 (05/07 0506) Intake/Output from previous day: 05/06 0701 - 05/07 0700 In: 679.2 [I.V.:479.2; IV Piggyback:200] Out: -  Intake/Output from this shift: Total I/O In: 360 [P.O.:360] Out: 300 [Urine:300]  Labs:  Recent Labs  06/03/14 2357  WBC 2.6*  HGB 10.9*  PLT 49*  CREATININE 0.98   Estimated Creatinine Clearance: 67.4 mL/min (by C-G formula based on Cr of 0.98). No results for input(s): VANCOTROUGH, VANCOPEAK, VANCORANDOM, GENTTROUGH, GENTPEAK, GENTRANDOM, TOBRATROUGH, TOBRAPEAK, TOBRARND, AMIKACINPEAK, AMIKACINTROU, AMIKACIN in the last 72 hours.   Microbiology: Recent Results (from the past 720 hour(s))  Urine culture     Status: None   Collection Time: 05/27/14  9:40 AM  Result Value Ref Range Status   Specimen Description URINE, CATHETERIZED  Final   Special Requests NONE  Final   Colony Count NO GROWTH Performed at [facility]   Final   Culture NO GROWTH Performed at [facility]   Final   Report Status 05/28/2014 FINAL  Final  Blood Culture (routine x 2)     Status: None   Collection Time: 05/27/14 10:16 AM  Result Value Ref Range Status   Specimen Description BLOOD RIGHT ANTECUBITAL  Final   Special Requests BOTTLES DRAWN AEROBIC AND ANAEROBIC 5CC  Final   Culture   Final    NO GROWTH 5 DAYS Performed at [facility]    Report Status 06/02/2014 FINAL  Final  Blood Culture (routine x 2)     Status: None   Collection Time: 05/27/14 10:19 AM  Result Value Ref Range Status   Specimen Description BLOOD LEFT WRIST  Final   Special Requests  BOTTLES DRAWN AEROBIC AND ANAEROBIC 3CC  Final   Culture   Final    NO GROWTH 5 DAYS Performed at [facility]    Report Status 06/02/2014 FINAL  Final  Body fluid culture     Status: None   Collection Time: 05/27/14 12:19 PM  Result Value Ref Range Status   Specimen Description PERITONEAL CAVITY  Final   Special Requests NONE  Final   Gram Stain   Final    FEW WBC PRESENT, PREDOMINANTLY PMN NO ORGANISMS SEEN Performed at [facility]    Culture   Final    NO GROWTH 3 DAYS Performed at [facility]    Report Status 05/30/2014 FINAL  Final    Medical History: Past Medical History  Diagnosis Date  . Liver disease   . Hepatitis   . Anemia   . Esophageal varices     Medications:  Prescriptions prior to admission  Medication Sig Dispense Refill Last Dose  . amoxicillin-clavulanate (AUGMENTIN) 875-125 MG per tablet Take 1 tablet by mouth 2 (two) times daily. 10 tablet 0 06/03/2014 at Unknown time  . furosemide (LASIX) 40 MG tablet Take 40-80 mg by mouth daily.    06/03/2014 at Unknown time  . lactulose (CHRONULAC) 10 GM/15ML solution Take 30 mLs (20 g total) by mouth 2 (two) times daily. 240 mL 0 06/03/2014 at Unknown time  . predniSONE (DELTASONE) 5 MG tablet Take 5 mg by mouth daily.   06/03/2014  at Unknown time  . propranolol (INDERAL LA) 60 MG 24 hr capsule Take 60 mg by mouth daily.   06/03/2014 at Unknown time  . spironolactone (ALDACTONE) 25 MG tablet Take 100 mg by mouth daily.    06/03/2014 at Unknown time  . ursodiol (ACTIGALL) 300 MG capsule Take 600 mg by mouth daily.    06/03/2014 at Unknown time  . Vitamin D, Ergocalciferol, (DRISDOL) 50000 UNITS CAPS capsule Take 1 capsule by mouth every 7 (seven) days. On Wednesdays   Past Week at Unknown time  . ondansetron (ZOFRAN) 4 MG tablet Take 1 tablet (4 mg total) by mouth every 6 (six) hours as needed for nausea. (Patient not taking: Reported on 04/07/2014) 20 tablet 0 Not Taking at Unknown time  . oxyCODONE  (OXY IR/ROXICODONE) 5 MG immediate release tablet Take 1-2 tablets (5-10 mg total) by mouth every 4 (four) hours as needed for moderate pain. (Patient not taking: Reported on 04/07/2014) 30 tablet 0 Not Taking at Unknown time   Scheduled:  . albumin human  50 g Intravenous Once  . feeding supplement (RESOURCE BREEZE)  1 Container Oral TID BM  . hydrocortisone sod succinate (SOLU-CORTEF) inj  100 mg Intravenous Q8H  . imipenem-cilastatin  250 mg Intravenous Q6H  . lactulose  20 g Oral BID  . predniSONE  5 mg Oral Daily  . ursodiol  600 mg Oral Daily   Infusions:  . sodium chloride 1,000 mL (06/04/14 0126)  . sodium chloride     Assessment: 23yo with history of autoimmune hepatitis with PSC overlap syndrome presents with abdominal pain, N/V and fever. Pharmacy is consulted to dose primaxin for suspected intra-abdominal infection. Pt is afebrile, WBC 2.6, sCr 0.98.   Pt received zosyn 3.375g IV once in the ED.  Goal of Therapy:  Eradication of infection  Plan:  Primaxin 250mg  IV q6h Follow up culture results, renal function, and clinical course  Arlean HoppingCorey M. Newman PiesBall, PharmD Clinical Pharmacist Pager [phone] 06/04/2014,10:18 AM   New orders to add vancomycin to regimen. 1g IV received in ED @~0600. Will continue with 750mg  IV q12 hours. Plan on checking trough early next week if to continue.  Sheppard CoilFrank Aila Terra PharmD., BCPS Clinical Pharmacist Pager [phone] 06/04/2014 11:20 AM

## 2014-06-04 NOTE — Progress Notes (Signed)
Initial Nutrition Assessment  DOCUMENTATION CODES:  Not applicable  INTERVENTION:  Boost Breeze  NUTRITION DIAGNOSIS:  Inadequate oral intake related to altered GI function as evidenced by estimated needs, meal completion < 25%.   GOAL:  Patient will meet greater than or equal to 90% of their needs   MONITOR:  PO intake, Supplement acceptance, Diet advancement, Labs, Weight trends, Skin, I & O's  REASON FOR ASSESSMENT:  Malnutrition Screening Tool    ASSESSMENT: Unfortunate 24 year old female with history of autoimmune hepatitis and 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 Found to have significant elevation in total bilirubin levels. Workup in progress to rule out cholangitis and subacute bacterial peritonitis. Gastroenterology has been consulted.  Pt off the floor for procedure at this time. Nutrition-focused physical exam deferred.  Pt with hx of weight loss, however, noted progressive wt gain over the past year. Per previous RD notes, pt has been maximizing PO intake and consuming nutritional supplements at home to gain weight.  Pt diet just advanced to clear liquid. Noted poor meal intake (PO: 25%). RD will continue Resource Breeze supplements.   Height:  Ht Readings from Last 1 Encounters:  06/04/14 4\' 11"  (1.499 m)    Weight:  Wt Readings from Last 1 Encounters:  06/04/14 120 lb 12.8 oz (54.795 kg)    Ideal Body Weight:  44.5 kg  Wt Readings from Last 10 Encounters:  06/04/14 120 lb 12.8 oz (54.795 kg)  05/27/14 105 lb 12.8 oz (47.991 kg)  01/15/14 107 lb 12.9 oz (48.9 kg)  05/28/13 97 lb 4.8 oz (44.135 kg)  10/19/11 120 lb (54.432 kg)  07/23/11 123 lb 3.8 oz (55.9 kg)  04/13/11 129 lb (58.514 kg)    BMI:  Body mass index is 24.39 kg/(m^2). Normal weight range  Estimated Nutritional Needs:  Kcal:  1650-1850  Protein:  65-75 grams  Fluid:   1.7-1.9 L  Skin:  Reviewed, no issues  Diet Order:  Diet clear liquid Room service appropriate?: Yes; Fluid consistency:: Thin  EDUCATION NEEDS:  Education needs no appropriate at this time   Intake/Output Summary (Last 24 hours) at 06/04/14 1451 Last data filed at 06/04/14 0906  Gross per 24 hour  Intake 1039.17 ml  Output    300 ml  Net 739.17 ml    Last BM:  06/04/14  Abril Cappiello A. Mayford KnifeWilliams, RD, LDN, CDE Pager: [phone] After hours Pager: [phone]

## 2014-06-04 NOTE — Procedures (Signed)
US guided diagnostic/therapeutic paracentesis performed yielding 1 liter (maximum ordered) clear, yellow fluid. A portion of the fluid was sent to the lab for preordered studies. No immediate complications or blood loss.

## 2014-06-04 NOTE — Progress Notes (Signed)
Received patient to room 24 from ED via stretcher, patient ambulated from stretcher to bed without problems, IVF infusing to LAC without problems, site clear. Admission paperwork complete, patient oriented to room, call bell, bed controls and patient guide booklet with verbal understanding. Denies pain at this time. SR up, call bell in reach. Will monitor.

## 2014-06-04 NOTE — ED Notes (Signed)
Transporting patient to new room assignment. 

## 2014-06-04 NOTE — Progress Notes (Signed)
Attempted to get report at this time.

## 2014-06-04 NOTE — ED Notes (Signed)
Patient is resting comfortably. 

## 2014-06-04 NOTE — Progress Notes (Addendum)
PATIENT DETAILS Name: Melanie Conley Age: 24 y.o. 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 Found to have significant elevation in total bilirubin levels. Workup in progress to rule out cholangitis and subacute bacterial peritonitis. Gastroenterology has been consulted.  Subjective: Complains of bilateral flank pain/abdominal pain. Mild headache.  Assessment/Plan: Principal Problem:   Sepsis: Current suspicion for either acute cholangitis or subacute bacterial peritonitis. Started on vancomycin and Primaxin as empiric coverage. Have ordered diagnostic paracentesis to rule out SBP, after discussion with gastroenterology-have ordered MRCP to better evaluate biliary system to make sure no cholangitis. Await culture data, and monitor closely. Decrease hydrocortisone to 50 mg 3 times a day-and slowly taper back to usual dosing of prednisone.  Active Problems:   Jaundice: With history of autoimmune hepatitis and primary sclerosing cholangitis-now mostly direct hyperbilirubinemia-concern for acute cholangitis. Start Primaxin. After discussion with gastroenterology, MRCP ordered. Will follow closely.    Abdominal pain: Distended on exam-has ascites clinically (not tense)-although recently has had negative paracentesis for SBP-given new fever, worsening liver function-have reordered diagnostic paracentesis along with one dose of albumin.. Continue Primaxin. Follow ascites studies.    Headache: Suspect secondary to sepsis from GI issues. Neck is supple-even if patient has meningitis (doubt)-above antibiotics should be suffice. Patient claims that-she has had migraine  headaches in the past-and this headache is somewhat similar to prior headaches as well. For now, continue antibiotics, treat above issues and hopefully headache would resolve. 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 On chronic prednisone and Imuran along with Ursodiol. Currently on stress dosing of steroids for sepsis and relative hypertension-plans to slowly transition back to her usual dosing of prednisone. Because of leukopenia, Imuran remains on hold. See above for further details. Will resume diuretics when BP more stable      Prior history of SBP and grade 2 esophageal varices: Secondary to above  Disposition: Remain inpatient  Antimicrobial agents  See below  Anti-infectives    Start     Dose/Rate Route Frequency Ordered Stop   06/04/14 1800  vancomycin (VANCOCIN) IVPB 750 mg/150 ml premix     750 mg 150 mL/hr over 60 Minutes Intravenous Every 12 hours 06/04/14 1122     06/04/14 1030  imipenem-cilastatin (PRIMAXIN) 250 mg in sodium chloride 0.9 % 100 mL IVPB     250 mg 200 mL/hr over 30 Minutes Intravenous Every 6 hours 06/04/14 1017     06/04/14 0345  vancomycin (VANCOCIN) IVPB 1000 mg/200 mL premix     1,000 mg 200 mL/hr over 60 Minutes Intravenous  Once 06/04/14 0344 06/04/14 0541   06/04/14 0345  piperacillin-tazobactam (ZOSYN) IVPB 3.375 g     3.375 g 100 mL/hr over 30 Minutes Intravenous  Once 06/04/14 0344 06/04/14 0429      DVT Prophylaxis:  SCD's  Code Status: Full code   Family Communication None at bedside-patient awake and alert  Procedures: Paracentesis-5/7  CONSULTS:  GI  Time spent 45 minutes-which includes 50% of the time with face-to-face with patient/ family  and coordinating care related to the above assessment and plan.  MEDICATIONS: Scheduled Meds: . albumin human  50 g Intravenous Once  . feeding supplement (RESOURCE BREEZE)  1 Container Oral TID BM  . hydrocortisone sod succinate (SOLU-CORTEF) inj  100 mg Intravenous Q8H  . imipenem-cilastatin  250 mg Intravenous Q6H  . lactulose  20 g Oral BID  . lidocaine (PF)      . predniSONE  5 mg Oral Daily  . ursodiol  600 mg Oral Daily  . vancomycin  750 mg Intravenous Q12H   Continuous Infusions: . sodium chloride 1,000 mL (06/04/14 0126)  . sodium chloride 75 mL/hr at 06/04/14 1106   PRN Meds:.alum & mag hydroxide-simeth, ondansetron **OR** ondansetron (ZOFRAN) IV, oxyCODONE    PHYSICAL EXAM: Vital signs in last 24 hours: Filed Vitals:   06/04/14 0508 06/04/14 1044 06/04/14 1117 06/04/14 1146  BP:  104/65 102/62 102/69  Pulse:  79    Temp:  98.2 F (36.8 C)    TempSrc:  Oral    Resp:  16    Height: 4\' 11"  (1.499 m)     Weight: 54.795 kg (120 lb 12.8 oz)     SpO2:  94%      Weight change:  Filed Weights   06/04/14 0508  Weight: 54.795 kg (120 lb 12.8 oz)   Body mass index is 24.39 kg/(m^2).   Gen Exam: Awake and alert with clear speech-but looks chronically sick and frail. Neck: Supple Chest: B/L Clear.   CVS: S1 S2 Regular, no murmurs.  Abdomen: soft, BS +, mildly tender diffusely mostly in the upper abdomen, some distention but not tense Extremities: 1+ edema, lower extremities warm to touch. Neurologic: Non Focal.   Skin: No Rash.   Wounds: N/A.    Intake/Output from previous day:  Intake/Output Summary (Last 24 hours) at 06/04/14 1159 Last data filed at 06/04/14 0906  Gross per 24 hour  Intake 1039.17 ml  Output    300 ml  Net 739.17 ml     LAB RESULTS: CBC  Recent Labs Lab 05/29/14 1620 06/03/14 2357  WBC 3.8* 2.6*  HGB 10.4* 10.9*  HCT 32.2* 33.1*  PLT 41* 49*  MCV 92.3 88.5  MCH 29.8 29.1  MCHC 32.3 32.9  RDW 18.5* 19.4*  LYMPHSABS 0.5*  1.2  MONOABS 0.3 0.4  EOSABS 0.0 0.0  BASOSABS 0.0 0.0    Chemistries   Recent Labs Lab 06/03/14 2357  NA 137  K 3.9  CL 105  CO2 23  GLUCOSE 88  BUN 14  CREATININE 0.98  CALCIUM 7.8*    CBG: No results for input(s): GLUCAP in the last 168 hours.  GFR Estimated Creatinine Clearance: 67.4 mL/min (by C-G formula based on Cr of 0.98).  Coagulation profile  Recent Labs Lab 06/04/14 0016  INR 1.77*    Cardiac Enzymes No results for input(s): CKMB, TROPONINI, MYOGLOBIN in the last 168 hours.  Invalid input(s): CK  Invalid input(s): POCBNP No results for input(s): DDIMER in the last 72 hours. No results for input(s): HGBA1C in the last 72 hours. No results for input(s): CHOL, HDL, LDLCALC, TRIG, CHOLHDL, LDLDIRECT in the last 72 hours. No results for input(s): TSH, T4TOTAL, T3FREE, THYROIDAB in the last 72 hours.  Invalid input(s): FREET3 No results for input(s): VITAMINB12, FOLATE, FERRITIN, TIBC, IRON, RETICCTPCT in the last 72 hours.  Recent Labs  06/03/14 2357  LIPASE  42    Urine Studies No results for input(s): UHGB, CRYS in the last 72 hours.  Invalid input(s): UACOL, UAPR, USPG, UPH, UTP, UGL, UKET, UBIL, UNIT, UROB, ULEU, UEPI, UWBC, URBC, UBAC, CAST, UCOM, BILUA  MICROBIOLOGY: Recent Results (from the past 240 hour(s))  Urine culture     Status: None   Collection Time: 05/27/14  9:40 AM  Result Value Ref Range Status   Specimen Description URINE, CATHETERIZED  Final   Special Requests NONE  Final   Colony Count NO GROWTH Performed at [facility]   Final   Culture NO GROWTH Performed at [facility]   Final   Report Status 05/28/2014 FINAL  Final  Blood Culture (routine x 2)     Status: None   Collection Time: 05/27/14 10:16 AM  Result Value Ref Range Status   Specimen Description BLOOD RIGHT ANTECUBITAL  Final   Special Requests BOTTLES DRAWN AEROBIC AND ANAEROBIC 5CC  Final   Culture   Final    NO GROWTH 5  DAYS Performed at [facility]    Report Status 06/02/2014 FINAL  Final  Blood Culture (routine x 2)     Status: None   Collection Time: 05/27/14 10:19 AM  Result Value Ref Range Status   Specimen Description BLOOD LEFT WRIST  Final   Special Requests BOTTLES DRAWN AEROBIC AND ANAEROBIC 3CC  Final   Culture   Final    NO GROWTH 5 DAYS Performed at [facility]    Report Status 06/02/2014 FINAL  Final  Body fluid culture     Status: None   Collection Time: 05/27/14 12:19 PM  Result Value Ref Range Status   Specimen Description PERITONEAL CAVITY  Final   Special Requests NONE  Final   Gram Stain   Final    FEW WBC PRESENT, PREDOMINANTLY PMN NO ORGANISMS SEEN Performed at [facility]    Culture   Final    NO GROWTH 3 DAYS Performed at [facility]    Report Status 05/30/2014 FINAL  Final    RADIOLOGY STUDIES/RESULTS: [place] Paracentesis  06/04/2014   INDICATION: Patient with history of cirrhosis, autoimmune hepatitis with PSC overlap syndrome, recurrent ascites. Request is made for diagnostic paracentesis up to 1 liter to rule out SBP.  EXAM: ULTRASOUND-GUIDED DIAGNOSTIC  PARACENTESIS  COMPARISON:  Prior paracentesis on 05/27/2014  MEDICATIONS: None.  COMPLICATIONS: None immediate  TECHNIQUE: Informed written consent was obtained from the patient after a discussion of the risks, benefits and alternatives to treatment. A timeout was performed prior to the initiation of the procedure.  Initial ultrasound scanning demonstrates a moderate to large amount of ascites within the left lower abdominal quadrant. The left lower abdomen was prepped and draped in the usual sterile fashion. 1% lidocaine was used for local anesthesia. Under direct ultrasound guidance, a 19 gauge, 7-cm, Yueh catheter was introduced. An ultrasound image was saved for documentation purposed. The paracentesis was performed. The catheter was removed and a dressing was applied. The patient  tolerated the procedure well without immediate post procedural complication.  FINDINGS: A total of approximately 1 liter of clear, yellow fluid was removed. Samples were sent to the laboratory as requested by the clinical team.  IMPRESSION: Successful ultrasound-guided diagnostic paracentesis yielding 1 liter of peritoneal fluid.  Read by: Jeananne RamaKevin Allred, PA-C   Electronically Signed   By: Corlis Leak  Hassell M.D.   On: 06/04/2014 11:53   [place] Paracentesis  05/27/2014   INDICATION: Cirrhosis,  autoimmune hepatitis with PSC overlap syndrome, splenomegaly, recurrent ascites. Request is made for diagnostic and therapeutic paracentesis.  EXAM: ULTRASOUND-GUIDED DIAGNOSTIC AND THERAPEUTIC PARACENTESIS  COMPARISON:  Prior paracentesis on 04/07/14  MEDICATIONS: None.  COMPLICATIONS: None immediate  TECHNIQUE: Informed written consent was obtained from the patient after a discussion of the risks, benefits and alternatives to treatment. A timeout was performed prior to the initiation of the procedure.  Initial ultrasound scanning demonstrates a moderate to large amount of ascites within the left lower abdominal quadrant. The left lower abdomen was prepped and draped in the usual sterile fashion. 1% lidocaine was used for local anesthesia. Under direct ultrasound guidance, a 19 gauge, 7-cm, Yueh catheter was introduced. An ultrasound image was saved for documentation purposed. The paracentesis was performed. The catheter was removed and a dressing was applied. The patient tolerated the procedure well without immediate post procedural complication.  FINDINGS: A total of approximately 4.3 liters of yellow fluid was removed. Samples were sent to the laboratory as requested by the clinical team.  IMPRESSION: Successful ultrasound-guided diagnostic and therapeutic paracentesis yielding 4.3 liters of peritoneal fluid.  Read by: Jeananne Rama, PA-C   Electronically Signed   By: Judie Petit.  Shick M.D.   On: 05/27/2014 12:09   Dg Chest Port 1  View  06/04/2014   CLINICAL DATA:  Headache and abdomen pain for 2 days  EXAM: PORTABLE CHEST - 1 VIEW  COMPARISON:  05/27/2014  FINDINGS: A single AP portable view of the chest demonstrates no focal airspace consolidation or alveolar edema. The lungs are grossly clear. There is no large effusion or pneumothorax. Cardiac and mediastinal contours appear unremarkable.  IMPRESSION: No active disease.   Electronically Signed   By: Ellery Plunk M.D.   On: 06/04/2014 02:07   Dg Chest Port 1 View  05/27/2014   CLINICAL DATA:  Headache. Shortness of breath. Cough and chest pain for 3 days.  EXAM: PORTABLE CHEST - 1 VIEW  COMPARISON:  01/12/2014  FINDINGS: Mild central airway thickening. The lungs appear otherwise clear. Cardiac and mediastinal margins appear normal. No pleural effusion.  IMPRESSION: 1. Airway thickening is present, suggesting bronchitis or reactive airways disease.   Electronically Signed   By: Gaylyn Rong M.D.   On: 05/27/2014 13:17    Jeoffrey Massed, MD  [facility] Pager:[phone]  If 7PM-7AM, please contact night-coverage [email or web address] Password [email or web address] 06/04/2014, 11:59 AM   LOS: 0 days

## 2014-06-04 NOTE — H&P (Addendum)
[facility] Admission History and Physical       Melanie SkyeZubrina D Bergman MVH:[id number] DOB: [birth year] DOA: 06/03/2014  Referring physician: EDP Dr. Preston FleetingGlick PCP: [name],[name], MD  Specialists:   Chief Complaint: ABD Pain Nausea and Vomiting and Fever  HPI: Melanie Conley is a 24 y.o. female with a history of Autoimmune Hepatitis and Cirrhosis who presents to the ED with complaints of ABD Pain and Nausea and Vomiting and Diarrhea x 2 days and Fever today to 101.2  She was hospitalized 04/29 - 05/01 for SBP Anemia and Neutropenia and was discharged on Augmentin for an additional 5 days.    She was also advised to remain off of her Imuran Rx until she haas been seen by Dr Piedad Climesarling [facility].    A Sepsis workup was initiated.     Review of Systems:  Constitutional: No Weight Loss, No Weight Gain, Night Sweats, Fevers, Chills, Dizziness, Light Headedness, Fatigue, or Generalized Weakness HEENT: No Headaches, Difficulty Swallowing,Tooth/Dental Problems,Sore Throat,  No Sneezing, Rhinitis, Ear Ache, Nasal Congestion, or Post Nasal Drip,  Cardio-vascular:  No Chest pain, Orthopnea, PND, Edema in Lower Extremities, Anasarca, Dizziness, Palpitations  Resp: No Dyspnea, No DOE, No Productive Cough, No Non-Productive Cough, No Hemoptysis, No Wheezing.    GI: No Heartburn, Indigestion, +Abdominal Pain, +Nausea, +Vomiting, Diarrhea, Constipation, Hematemesis, Hematochezia, Melena, Change in Bowel Habits,  Loss of Appetite  GU: No Dysuria, No Change in Color of Urine, No Urgency or Urinary Frequency, No Flank pain.  Musculoskeletal: No Joint Pain or Swelling, No Decreased Range of Motion, No Back Pain.  Neurologic: No Syncope, No Seizures, Muscle Weakness, Paresthesia, Vision Disturbance or Loss, No Diplopia, No Vertigo, No Difficulty Walking,  Skin: No Rash or Lesions. Psych: No Change in Mood or Affect, No Depression or Anxiety, No Memory loss, No Confusion, or Hallucinations   Past Medical  History  Diagnosis Date  . Liver disease   . Hepatitis   . Anemia   . Esophageal varices      Past Surgical History  Procedure Laterality Date  . Gastric banding port revision    . Tonsillectomy    . Esophagogastroduodenoscopy  07/22/2011    Procedure: ESOPHAGOGASTRODUODENOSCOPY (EGD);  Surgeon: Graylin ShiverSalem F Ganem, MD;  [facility];  Service: Endoscopy;  Laterality: N/A;      Prior to Admission medications   Medication Sig Start Date End Date Taking? Authorizing Provider  amoxicillin-clavulanate (AUGMENTIN) 875-125 MG per tablet Take 1 tablet by mouth 2 (two) times daily. 05/29/14  Yes Rhetta MuraJai-Gurmukh Samtani, MD  furosemide (LASIX) 40 MG tablet Take 40-80 mg by mouth daily.    Yes Historical Provider, MD  lactulose (CHRONULAC) 10 GM/15ML solution Take 30 mLs (20 g total) by mouth 2 (two) times daily. 05/29/14  Yes Rhetta MuraJai-Gurmukh Samtani, MD  predniSONE (DELTASONE) 5 MG tablet Take 5 mg by mouth daily.   Yes Historical Provider, MD  propranolol (INDERAL LA) 60 MG 24 hr capsule Take 60 mg by mouth daily.   Yes Historical Provider, MD  spironolactone (ALDACTONE) 25 MG tablet Take 100 mg by mouth daily.    Yes Historical Provider, MD  ursodiol (ACTIGALL) 300 MG capsule Take 600 mg by mouth daily.    Yes Historical Provider, MD  Vitamin D, Ergocalciferol, (DRISDOL) 50000 UNITS CAPS capsule Take 1 capsule by mouth every 7 (seven) days. On Wednesdays 02/25/14 02/25/15 Yes Historical Provider, MD  ondansetron (ZOFRAN) 4 MG tablet Take 1 tablet (4 mg total) by mouth every 6 (six) hours  as needed for nausea. Patient not taking: Reported on 04/07/2014 01/16/14   Henderson CloudEstela Y Hernandez Acosta, MD  oxyCODONE (OXY IR/ROXICODONE) 5 MG immediate release tablet Take 1-2 tablets (5-10 mg total) by mouth every 4 (four) hours as needed for moderate pain. Patient not taking: Reported on 04/07/2014 01/16/14   Henderson CloudEstela Y Hernandez Acosta, MD     No Known Allergies   Social History:  reports that she has never  smoked. She has never used smokeless tobacco. She reports that she does not drink alcohol or use illicit drugs.     Family History:     Unknown Patient was Adopted   Physical Exam:  GEN:  Pleasant Thin ill Appearing  24 y.o. African American female examined and in no acute distress; cooperative with exam Filed Vitals:   06/04/14 0345 06/04/14 0400 06/04/14 0415 06/04/14 0430  BP: 95/59 99/59 87/67  97/63  Pulse: 90 91 91 94  Temp:      TempSrc:      Resp: 24 22 20 20   SpO2: 96% 98% 96% 98%   Blood pressure 97/63, pulse 94, temperature 101.2 F (38.4 C), temperature source Oral, resp. rate 20, SpO2 98 %. PSYCH: She is alert and oriented x4; does not appear anxious does not appear depressed; affect is normal HEENT: Normocephalic and Atraumatic, Mucous membranes pink; PERRLA; EOM intact; Fundi:  Benign;  No scleral icterus, Nares: Patent, Oropharynx: Clear, Fair Dentition,    Neck:  FROM, No Cervical Lymphadenopathy nor Thyromegaly or Carotid Bruit; No JVD; Breasts:: Not examined CHEST WALL: No tenderness CHEST: Normal respiration, clear to auscultation bilaterally HEART: Regular rate and rhythm; no murmurs rubs or gallops BACK: No kyphosis or scoliosis; No CVA tenderness ABDOMEN: Positive Bowel Sounds, Scaphoid, Distended, but Soft mild diffuse Tenderness, No Rebound or Guarding; No Masses, No Organomegaly Rectal Exam: Not done EXTREMITIES: No Cyanosis, Clubbing, or Edema; No Ulcerations. Genitalia: not examined PULSES: 2+ and symmetric SKIN: Normal hydration no rash or ulceration CNS:  Alert and Oriented x 4, No Focal Deficits Vascular: pulses palpable throughout    Labs on Admission:  Basic Metabolic Panel:  Recent Labs Lab 05/28/14 0707 06/03/14 2357  NA 133* 137  K 3.3* 3.9  CL 102 105  CO2 26 23  GLUCOSE 128* 88  BUN <5* 14  CREATININE 0.59 0.98  CALCIUM 7.7* 7.8*  MG 2.2  --    Liver Function Tests:  Recent Labs Lab 05/28/14 0707 06/03/14 2357  AST 57*  88*  ALT 24 51  ALKPHOS 260* 395*  BILITOT 4.6* 12.4*  PROT 4.9* 5.9*  ALBUMIN 2.9* 2.4*    Recent Labs Lab 06/03/14 2357  LIPASE 42   No results for input(s): AMMONIA in the last 168 hours. CBC:  Recent Labs Lab 05/28/14 0707 05/29/14 1620 06/03/14 2357  WBC 1.4* 3.8* 2.6*  NEUTROABS 0.8* 3.0 1.0*  HGB 8.9* 10.4* 10.9*  HCT 27.6* 32.2* 33.1*  MCV 91.7 92.3 88.5  PLT 44* 41* 49*   Cardiac Enzymes: No results for input(s): CKTOTAL, CKMB, CKMBINDEX, TROPONINI in the last 168 hours.  BNP (last 3 results) No results for input(s): BNP in the last 8760 hours.  ProBNP (last 3 results) No results for input(s): PROBNP in the last 8760 hours.  CBG: No results for input(s): GLUCAP in the last 168 hours.  Radiological Exams on Admission: Dg Chest Port 1 View  06/04/2014   CLINICAL DATA:  Headache and abdomen pain for 2 days  EXAM: PORTABLE CHEST - 1 VIEW  COMPARISON:  05/27/2014  FINDINGS: A single AP portable view of the chest demonstrates no focal airspace consolidation or alveolar edema. The lungs are grossly clear. There is no large effusion or pneumothorax. Cardiac and mediastinal contours appear unremarkable.  IMPRESSION: No active disease.   Electronically Signed   By: Ellery Plunk M.D.   On: 06/04/2014 02:07      Assessment/Plan:   24 y.o. female with  Principal Problem:   1.   Sepsis   Sepsis Protocol Initiated   Blood and Urine Cultures ordered   IV Vancomycin and Zosyn   IVFs   Active Problems:   2.   Hypotension- Due to #1 and possibly due to Adrenal Suppression from Chronic Steroid Rx   IVFs   Check Cortisol Level    And Start Stress Dose Steroids     3.  Abdominal pain, acute- Die to Ascites or SBP   Continue IV Antibiotics        4.  Nausea and vomiting   Prn IV Zofran      5.  Autoimmune hepatitis   Monitor LFTs      6.  Neutropenia   Monitor WBC trend and  ANC   IV Antibiotics    7.  Cirrhosis, non-alcoholic   Monitor  LFTs     8.  Hypoalbuminemia- Due to Cirrhosis        9.   DVT Prophylaxis   SCDs     Code Status:     FULL CODE        Family Communication:    No Family Present    Disposition Plan:    Inpatient   Status        Time spent:  7 Minutes      Ron Parker [facility] Pager [phone]   If 7AM -7PM Please Contact the Day Rounding Team MD for [facility]  If 7PM-7AM, Please Contact Night-Floor Coverage  [email or web address] Password [email or web address] 06/04/2014, 4:45 AM     ADDENDUM:   Patient was seen and examined on 06/04/2014

## 2014-06-05 LAB — HEPATIC FUNCTION PANEL
ALK PHOS: 255 U/L — AB (ref 38–126)
ALT: 32 U/L (ref 14–54)
AST: 49 U/L — ABNORMAL HIGH (ref 15–41)
Albumin: 2.6 g/dL — ABNORMAL LOW (ref 3.5–5.0)
BILIRUBIN DIRECT: 5.7 mg/dL — AB (ref 0.1–0.5)
Indirect Bilirubin: 3.4 mg/dL — ABNORMAL HIGH (ref 0.3–0.9)
Total Bilirubin: 9.1 mg/dL — ABNORMAL HIGH (ref 0.3–1.2)
Total Protein: 5.1 g/dL — ABNORMAL LOW (ref 6.5–8.1)

## 2014-06-05 LAB — CBC
HCT: 25.2 % — ABNORMAL LOW (ref 36.0–46.0)
Hemoglobin: 8.3 g/dL — ABNORMAL LOW (ref 12.0–15.0)
MCH: 29.1 pg (ref 26.0–34.0)
MCHC: 32.9 g/dL (ref 30.0–36.0)
MCV: 88.4 fL (ref 78.0–100.0)
PLATELETS: 31 10*3/uL — AB (ref 150–400)
RBC: 2.85 MIL/uL — AB (ref 3.87–5.11)
RDW: 19.1 % — AB (ref 11.5–15.5)
WBC: 2.2 10*3/uL — AB (ref 4.0–10.5)

## 2014-06-05 LAB — BASIC METABOLIC PANEL
Anion gap: 8 (ref 5–15)
BUN: 9 mg/dL (ref 6–20)
CALCIUM: 8.1 mg/dL — AB (ref 8.9–10.3)
CHLORIDE: 107 mmol/L (ref 101–111)
CO2: 21 mmol/L — ABNORMAL LOW (ref 22–32)
CREATININE: 0.64 mg/dL (ref 0.44–1.00)
GFR calc non Af Amer: >60 mL/min (ref >60)
Glucose, Bld: 158 mg/dL — ABNORMAL HIGH (ref 70–99)
Potassium: 3.9 mmol/L (ref 3.5–5.1)
Sodium: 136 mmol/L (ref 135–145)

## 2014-06-05 LAB — PROTIME-INR
INR: 1.77 — ABNORMAL HIGH (ref 0.00–1.49)
Prothrombin Time: 20.8 seconds — ABNORMAL HIGH (ref 11.6–15.2)

## 2014-06-05 MED ORDER — HYDROCORTISONE NA SUCCINATE PF 100 MG IJ SOLR
25.0000 mg | Freq: Two times a day (BID) | INTRAMUSCULAR | Status: DC
  Administered 2014-06-05 – 2014-06-06 (×4): 25 mg via INTRAVENOUS
  Filled 2014-06-05 (×6): qty 0.5

## 2014-06-05 MED ORDER — FUROSEMIDE 40 MG PO TABS: 40.0000 mg | ORAL_TABLET | Freq: Every day | ORAL | Status: DC

## 2014-06-05 MED ORDER — FUROSEMIDE 40 MG PO TABS
40.0000 mg | ORAL_TABLET | Freq: Every day | ORAL | Status: DC
  Administered 2014-06-05 – 2014-06-08 (×4): 40 mg via ORAL
  Filled 2014-06-05 (×4): qty 1

## 2014-06-05 MED ORDER — HYDROCORTISONE NA SUCCINATE PF 100 MG IJ SOLR: 25.0000 mg | Freq: Two times a day (BID) | INTRAMUSCULAR | Status: DC

## 2014-06-05 MED ORDER — SPIRONOLACTONE 100 MG PO TABS
100.0000 mg | ORAL_TABLET | Freq: Every day | ORAL | Status: DC
  Administered 2014-06-05 – 2014-06-08 (×4): 100 mg via ORAL
  Filled 2014-06-05 (×4): qty 1

## 2014-06-05 MED ORDER — BOOST / RESOURCE BREEZE PO LIQD: 1.0000 | Freq: Three times a day (TID) | ORAL | Status: DC

## 2014-06-05 MED ORDER — OXYCODONE HCL 5 MG PO TABS
5.0000 mg | ORAL_TABLET | ORAL | Status: DC | PRN
  Administered 2014-06-05 – 2014-06-07 (×4): 10 mg via ORAL
  Filled 2014-06-05 (×4): qty 2

## 2014-06-05 MED ORDER — VANCOMYCIN HCL IN DEXTROSE 750-5 MG/150ML-% IV SOLN: 750.0000 mg | Freq: Two times a day (BID) | INTRAVENOUS | Status: DC

## 2014-06-05 MED ORDER — HYDROCORTISONE NA SUCCINATE PF 100 MG IJ SOLR
25.0000 mg | Freq: Two times a day (BID) | INTRAMUSCULAR | Status: DC
  Filled 2014-06-05 (×2): qty 0.5

## 2014-06-05 MED ORDER — SODIUM CHLORIDE 0.9 % IV SOLN: 250.0000 mg | Freq: Four times a day (QID) | INTRAVENOUS | Status: DC

## 2014-06-05 NOTE — Discharge Summary (Addendum)
PATIENT DETAILS Name: Melanie Conley Age: 24 y.o. Sex: female Date of Birth: [birth year] MRN: [id number]. Admitting Physician: Ron Parker, MD WUJ:[id number],WGNF, MD  Admit Date: 06/03/2014 Discharge date: 06/07/2014  Recommendations for Outpatient Follow-up:  1. Follow blood culture/ascitic fluid cultures till final. 2. Imuran on hold-resume when able   PRIMARY DISCHARGE DIAGNOSIS:  Principal Problem:   Sepsis Active Problems:   Abdominal pain, acute   Autoimmune hepatitis   Neutropenia   Nausea and vomiting   Cirrhosis, non-alcoholic   Hypoalbuminemia   Hypotension   Pancytopenia   Jaundice      PAST MEDICAL HISTORY: Past Medical History  Diagnosis Date  . Liver disease   . Hepatitis   . Anemia   . Esophageal varices     DISCHARGE MEDICATIONS: Current Discharge Medication List    START taking these medications   Details  Ampicillin-Sulbactam 3 g in sodium chloride 0.9 % 100 mL Inject 3 g into the vein every 6 (six) hours.    feeding supplement, RESOURCE BREEZE, (RESOURCE BREEZE) LIQD Take 1 Container by mouth 3 (three) times daily between meals. Refills: 0    metroNIDAZOLE (FLAGYL) 500 MG tablet Take 1 tablet (500 mg total) by mouth every 12 (twelve) hours. 7 days from 5/10      CONTINUE these medications which have CHANGED   Details  furosemide (LASIX) 40 MG tablet Take 1 tablet (40 mg total) by mouth daily. Qty: 30 tablet    lactulose (CHRONULAC) 10 GM/15ML solution Take 30 mLs (20 g total) by mouth 2 (two) times daily. Qty: 240 mL, Refills: 0      CONTINUE these medications which have NOT CHANGED   Details  predniSONE (DELTASONE) 5 MG tablet Take 5 mg by mouth daily.    spironolactone (ALDACTONE) 25 MG tablet Take 100 mg by mouth daily.     ursodiol (ACTIGALL) 300 MG capsule Take 600 mg by mouth daily.       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 Found to have significant elevation in total bilirubin levels  CONSULTATIONS:   GI  PERTINENT RADIOLOGIC STUDIES: US Paracentesis  06/04/2014   INDICATION: Patient with history of cirrhosis, autoimmune hepatitis with PSC overlap syndrome, recurrent ascites. Request is made for diagnostic paracentesis up to 1 liter to rule out SBP.  EXAM: ULTRASOUND-GUIDED DIAGNOSTIC  PARACENTESIS  COMPARISON:  Prior paracentesis on 05/27/2014  MEDICATIONS: None.  COMPLICATIONS: None immediate  TECHNIQUE: Informed written consent was obtained from the patient after a discussion of the risks, benefits and alternatives to treatment. A timeout was performed prior to the initiation of the procedure.  Initial ultrasound scanning demonstrates a moderate to large amount of ascites within the left lower abdominal quadrant. The left lower abdomen was prepped and draped in the usual sterile fashion. 1% lidocaine  was used for local anesthesia. Under direct ultrasound guidance, a 19 gauge, 7-cm, Yueh catheter was introduced. An ultrasound image was saved for documentation purposed. The paracentesis was performed. The catheter was removed and a dressing was applied. The patient tolerated the procedure well without immediate post procedural complication.  FINDINGS: A total of approximately 1 liter of clear, yellow fluid was removed. Samples were sent to the laboratory as requested by the clinical team.  IMPRESSION:  Successful ultrasound-guided diagnostic paracentesis yielding 1 liter of peritoneal fluid.  Read by: Jeananne Rama, PA-C   Electronically Signed   By: Corlis Leak M.D.   On: 06/04/2014 11:53   US Paracentesis  05/27/2014   INDICATION: Cirrhosis, autoimmune hepatitis with PSC overlap syndrome, splenomegaly, recurrent ascites. Request is made for diagnostic and therapeutic paracentesis.  EXAM: ULTRASOUND-GUIDED DIAGNOSTIC AND THERAPEUTIC PARACENTESIS  COMPARISON:  Prior paracentesis on 04/07/14  MEDICATIONS: None.  COMPLICATIONS: None immediate  TECHNIQUE: Informed written consent was obtained from the patient after a discussion of the risks, benefits and alternatives to treatment. A timeout was performed prior to the initiation of the procedure.  Initial ultrasound scanning demonstrates a moderate to large amount of ascites within the left lower abdominal quadrant. The left lower abdomen was prepped and draped in the usual sterile fashion. 1% lidocaine was used for local anesthesia. Under direct ultrasound guidance, a 19 gauge, 7-cm, Yueh catheter was introduced. An ultrasound image was saved for documentation purposed. The paracentesis was performed. The catheter was removed and a dressing was applied. The patient tolerated the procedure well without immediate post procedural complication.  FINDINGS: A total of approximately 4.3 liters of yellow fluid was removed. Samples were sent to the laboratory as requested by the clinical team.  IMPRESSION: Successful ultrasound-guided diagnostic and therapeutic paracentesis yielding 4.3 liters of peritoneal fluid.  Read by: Jeananne Rama, PA-C   Electronically Signed   By: Judie Petit.  Shick M.D.   On: 05/27/2014 12:09   Dg Chest Port 1 View  06/04/2014   CLINICAL DATA:  Headache and abdomen pain for 2 days  EXAM: PORTABLE CHEST - 1 VIEW  COMPARISON:  05/27/2014  FINDINGS: A single AP portable view of the chest demonstrates no focal airspace consolidation or alveolar edema. The  lungs are grossly clear. There is no large effusion or pneumothorax. Cardiac and mediastinal contours appear unremarkable.  IMPRESSION: No active disease.   Electronically Signed   By: Ellery Plunk M.D.   On: 06/04/2014 02:07   Dg Chest Port 1 View  05/27/2014   CLINICAL DATA:  Headache. Shortness of breath. Cough and chest pain for 3 days.  EXAM: PORTABLE CHEST - 1 VIEW  COMPARISON:  01/12/2014  FINDINGS: Mild central airway thickening. The lungs appear otherwise clear. Cardiac and mediastinal margins appear normal. No pleural effusion.  IMPRESSION: 1. Airway thickening is present, suggesting bronchitis or reactive airways disease.   Electronically Signed   By: Gaylyn Rong M.D.   On: 05/27/2014 13:17   Mr Abd W/wo Cm/mrcp  06/04/2014   CLINICAL DATA:  Autoimmune hepatitis with primary sclerosing cholangitis. Varices and ascites. Recent banding for esophageal varices at [facility]. Neutropenia. Abdominal pain with nausea and vomiting along with fever. Increasing total bilirubin. Jaundice.  EXAM: MRI ABDOMEN WITHOUT AND WITH CONTRAST (INCLUDING MRCP)  TECHNIQUE: Multiplanar multisequence MR imaging of the abdomen was performed both before and after the administration of intravenous contrast. Heavily T2-weighted images of the biliary and pancreatic ducts were obtained, and three-dimensional MRCP images were rendered by post  processing.  CONTRAST:  10 cc MultiHance  COMPARISON:  Multiple exams, including 01/12/2014, 05/27/2014, and 06/04/2014  FINDINGS: Despite efforts by the technologist and patient, motion artifact is present on today's exam and could not be eliminated. This reduces exam sensitivity and specificity.  Lower chest:  Unremarkable  Hepatobiliary: Small nodular liver. Scattered high linear T2 signal suggesting fibrosis. No focal enhancing mass to suggest hepatocellular carcinoma. No findings of biliary obstruction. Contracted gallbladder with borderline wall thickening.  Pancreas: Generally poor  pancreatic enhancement but without pancreatic mass observed.  Spleen: Splenomegaly, splenic volume calculated at 1100 cc. Peripheral splenic triangular hypodensity, image 46 of series 16109, compatible with in the splenic infarct, size 2.5 by 2.5 cm.  Adrenals/Urinary Tract: Adrenal glands unremarkable although left adrenal partially obscured by surrounding varices. Kidneys intact were visualized.  Stomach/Bowel: Wall thickening in the colon. This wall thickening is improved compared to 01/12/14, but not completely resolved.  Vascular/Lymphatic: Diminutive portal vein. Splenic vein patent. Extensive ascites along the omentum, splenic hilum, stomach fundus, and along the medial margin of the spleen.  Other: Massive ascites.  Musculoskeletal: Small umbilical hernia observed on image 38 series 3 with internal fluid and conceivably a small knuckle of bowel.  IMPRESSION: 1. 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 Splenomegaly with overall splenic volume at 1100 cc. 2. Essentially stable varices.  Massive ascites. 3. There still some wall thickening in the colon, which suggests hypoproteinemia as a possible underlying etiology, although the wall thickening is less striking than was present on 01/12/2014. 4. Cirrhotic liver. No focal hepatic mass to suggest hepatocellular carcinoma. 5. Somewhat diminutive portal vein, but otherwise patent. 6. Small umbilical hernia contains internal fluid and conceivably a small knuckle of bowel. 7. No extrahepatic biliary dilatation.   Electronically Signed   By: Gaylyn Rong M.D.   On: 06/04/2014 14:34     PERTINENT LAB RESULTS: CBC:  Recent Labs  06/06/14 0448 06/07/14 0546  WBC 2.3* 1.8*  HGB 7.9* 7.3*  HCT 24.5* 22.7*  PLT 33* 30*   CMET CMP     Component Value Date/Time   NA 134* 06/07/2014 0546   K 3.1* 06/07/2014 0546   CL 101 06/07/2014 0546   CO2 26 06/07/2014 0546   GLUCOSE 182* 06/07/2014 0546   BUN 9 06/07/2014 0546   CREATININE 0.70  06/07/2014 0546   CALCIUM 8.0* 06/07/2014 0546   PROT 5.1* 06/07/2014 0546   ALBUMIN 2.2* 06/07/2014 0546   AST 50* 06/07/2014 0546   ALT 37 06/07/2014 0546   ALKPHOS 249* 06/07/2014 0546   BILITOT 7.4* 06/07/2014 0546   GFRNONAA >60 06/07/2014 0546   GFRAA >60 06/07/2014 0546    GFR Estimated Creatinine Clearance: 82.5 mL/min (by C-G formula based on Cr of 0.7). No results for input(s): LIPASE, AMYLASE in the last 72 hours. No results for input(s): CKTOTAL, CKMB, CKMBINDEX, TROPONINI in the last 72 hours. Invalid input(s): POCBNP No results for input(s): DDIMER in the last 72 hours. No results for input(s): HGBA1C in the last 72 hours. No results for input(s): CHOL, HDL, LDLCALC, TRIG, CHOLHDL, LDLDIRECT in the last 72 hours. No results for input(s): TSH, T4TOTAL, T3FREE, THYROIDAB in the last 72 hours.  Invalid input(s): FREET3 No results for input(s): VITAMINB12, FOLATE, FERRITIN, TIBC, IRON, RETICCTPCT in the last 72 hours. Coags:  Recent Labs  06/05/14 0559  INR 1.77*   Microbiology: Recent Results (from the past 240 hour(s))  Culture, blood (routine x 2)     Status: None (  Preliminary result)   Collection Time: 06/04/14 12:16 AM  Result Value Ref Range Status   Specimen Description BLOOD RIGHT ARM  Final   Special Requests BOTTLES DRAWN AEROBIC AND ANAEROBIC  5CC  Final   Culture   Final           BLOOD CULTURE RECEIVED NO GROWTH TO DATE CULTURE WILL BE HELD FOR 5 DAYS BEFORE ISSUING A FINAL NEGATIVE REPORT Performed at [facility]    Report Status PENDING  Incomplete  Culture, blood (routine x 2)     Status: None (Preliminary result)   Collection Time: 06/04/14 12:20 AM  Result Value Ref Range Status   Specimen Description BLOOD LEFT HAND  Final   Special Requests BOTTLES DRAWN AEROBIC AND ANAEROBIC 5CC EACH  Final   Culture   Final           BLOOD CULTURE RECEIVED NO GROWTH TO DATE CULTURE WILL BE HELD FOR 5 DAYS BEFORE ISSUING A FINAL NEGATIVE  REPORT Performed at [facility]    Report Status PENDING  Incomplete  Culture, Urine     Status: None   Collection Time: 06/04/14  9:20 AM  Result Value Ref Range Status   Specimen Description URINE, CLEAN CATCH  Final   Special Requests Immunocompromised  Final   Colony Count   Final    15,000 COLONIES/ML Performed at [facility]    Culture   Final    ENTEROCOCCUS SPECIES Performed at [facility]    Report Status 06/06/2014 FINAL  Final   Organism ID, Bacteria ENTEROCOCCUS SPECIES  Final      Susceptibility   Enterococcus species - MIC*    AMPICILLIN <=2 SENSITIVE Sensitive     LEVOFLOXACIN 2 SENSITIVE Sensitive     NITROFURANTOIN <=16 SENSITIVE Sensitive     VANCOMYCIN 2 SENSITIVE Sensitive     TETRACYCLINE <=1 SENSITIVE Sensitive     * ENTEROCOCCUS SPECIES  Body fluid culture     Status: None (Preliminary result)   Collection Time: 06/04/14 12:01 PM  Result Value Ref Range Status   Specimen Description PERITONEAL FLUID  Final   Special Requests NONE  Final   Gram Stain   Final    RARE WBC PRESENT,BOTH PMN AND MONONUCLEAR NO ORGANISMS SEEN Performed at [facility]    Culture   Final    NO GROWTH 2 DAYS Performed at [facility]    Report Status PENDING  Incomplete     BRIEF HOSPITAL COURSE:  Brief narrative:  Unfortunate 24 year old female with history of autoimmune hepatitis and 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 Found to have significant elevation in total bilirubin levels. Initially suspected to have either cholangitis and subacute bacterial peritonitis. However-Ascitis studies not consistent with SBP, and MRCP did not show any biliary dilatation or any acute abnormalities. Blood culture 5/7 neg so far. Note-this is patient's second admission within a week for similar complaints/issues.Since all cultures continue to  be negative will stop both vancomycin and Primaxin and maintain on Unasyn (urine culture positive for enterococcus-suspect colonization)   Hospital course by Problem List:   Sepsis:initial suspicion for either acute cholangitis or subacute bacterial peritonitis. Started on vancomycin and Primaxin as empiric coverage-now transitioned to Unasyn.Underwent diagnostic paracentesis by IR (1 L removed)-WBC only 43-cultures so far negative-so doubt SBP. MRCP negative for signigicant abnormalities-no evidence of cholangitis. Await final  culture results-etiology of sepsis is uncertain at this point, [facility].    Jaundice: mostly mostly direct hyperbilirubinemia-?etiology. No biliary dilatation seen on MRCP. As noted above being [facility] [place] for evaluation by Hepatology.    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 On chronic prednisone and Imuran along with Ursodiol. Received stress dosing of steroids for sepsis and relative hypotension- transitioned back to her usual dosing of prednisone. Because of leukopenia, Imuran remains on hold. Since BP stable, diuretics resumed today     Prior history of SBP and grade 2 esophageal varices: Secondary to above   Trichomoniasis: Start Flagyl-70s from 5/10   Urine culture positive for enterococcus: Likely colonization-Will be on antibiotics-would easily cover this anyway.  TODAY-DAY OF DISCHARGE:  Subjective:   Kathlyn SacramentoZubrina Pittsley today has no headache,no chest abdominal pain,no new weakness tingling or numbness, feels much better   Objective:   Blood pressure  117/68, pulse 57, temperature 98.1 F (36.7 C), temperature source Oral, resp. rate 16, height 4\' 11"  (1.499 m), weight 54.795 kg (120 lb 12.8 oz), SpO2 99 %.  Intake/Output Summary (Last 24 hours) at 06/07/14 1357 Last data filed at 06/07/14 1053  Gross per 24 hour  Intake    823 ml  Output   1075 ml  Net   -252 ml   Filed Weights   06/04/14 0508  Weight: 54.795 kg (120 lb 12.8 oz)    Exam Awake Alert, Oriented *3, No new F.N deficits, Normal affect.+Jaundice. Chronically ill looking Point.AT,PERRAL Supple Neck,No JVD, No cervical lymphadenopathy appriciated.  Symmetrical Chest wall movement, Good air movement bilaterally, CTAB RRR,No Gallops,Rubs or new Murmurs, No Parasternal Heave +ve B.Sounds, Abd Soft, Non tender, No organomegaly appriciated, No rebound -guarding or rigidity.Distented with fluid thrill but not tense No Cyanosis, Clubbing. 1+ edema, No new Rash or bruise  DISCHARGE CONDITION: Stable  DISPOSITION: [facility] [place]  DISCHARGE INSTRUCTIONS:    Activity:  As tolerated with Full fall precautions use walker/cane & assistance as needed  Diet recommendation: Heart Healthy diet  Discharge Instructions    Diet - low sodium heart healthy    Complete by:  As directed      Increase activity slowly    Complete by:  As directed            Follow-up Information    Schedule an appointment as soon as possible for a visit in 1 week to follow up.      Follow up with [name],[name], MD On 07/04/2014.   Specialty:  Internal Medicine   Why:  Appointment with Dr. [name] [name] is on 07/04/14 at 9:40am   Contact information:   [address] [facility] [id number] [place] [id number] [phone]      Total Time spent on discharge equals 45 minutes.  SignedJeoffrey Massed: GHIMIRE,SHANKER 06/07/2014 1:57 PM

## 2014-06-05 NOTE — Progress Notes (Signed)
[facility] Progress Note    Since last GI note: Feels pretty well, eating without n/v.  NO overt bleeding.  Not encephalopathic  Objective: Vital signs in last 24 hours: Temp:  [97.3 F (36.3 C)-98.2 F (36.8 C)] 97.6 F (36.4 C) (05/08 0622) Pulse Rate:  [51-76] 51 (05/08 0622) Resp:  [16-20] 17 (05/08 0622) BP: (101-117)/(62-76) 102/62 mmHg (05/08 0622) SpO2:  [93 %-99 %] 96 % (05/08 0622) Last BM Date: 06/04/14 General: alert and oriented times 3 Heart: regular rate and rythm Abdomen: soft, non-tender, non-distended, normal bowel sounds   Lab Results:  Recent Labs  06/03/14 2357 06/05/14 0559  WBC 2.6* 2.2*  HGB 10.9* 8.3*  PLT 49* 31*  MCV 88.5 88.4    Recent Labs  06/03/14 2357 06/05/14 0559  NA 137 136  K 3.9 3.9  CL 105 107  CO2 23 21*  GLUCOSE 88 158*  BUN 14 9  CREATININE 0.98 0.64  CALCIUM 7.8* 8.1*    Recent Labs  06/03/14 2357 06/04/14 1045 06/05/14 0559  PROT 5.9* 4.8* 5.1*  ALBUMIN 2.4* 1.9* 2.6*  AST 88* 69* 49*  ALT 51 39 32  ALKPHOS 395* 319* 255*  BILITOT 12.4* 11.4* 9.1*  BILIDIR  --  7.1* 5.7*  IBILI  --  4.3* 3.4*    Recent Labs  06/04/14 0016 06/05/14 0559  INR 1.77* 1.77*     Studies/Results: [place] Paracentesis  06/04/2014   INDICATION: Patient with history of cirrhosis, autoimmune hepatitis with PSC overlap syndrome, recurrent ascites. Request is made for diagnostic paracentesis up to 1 liter to rule out SBP.  EXAM: ULTRASOUND-GUIDED DIAGNOSTIC  PARACENTESIS  COMPARISON:  Prior paracentesis on 05/27/2014  MEDICATIONS: None.  COMPLICATIONS: None immediate  TECHNIQUE: Informed written consent was obtained from the patient after a discussion of the risks, benefits and alternatives to treatment. A timeout was performed prior to the initiation of the procedure.  Initial ultrasound scanning demonstrates a moderate to large amount of ascites within the left lower abdominal quadrant. The left lower abdomen was prepped  and draped in the usual sterile fashion. 1% lidocaine was used for local anesthesia. Under direct ultrasound guidance, a 19 gauge, 7-cm, Yueh catheter was introduced. An ultrasound image was saved for documentation purposed. The paracentesis was performed. The catheter was removed and a dressing was applied. The patient tolerated the procedure well without immediate post procedural complication.  FINDINGS: A total of approximately 1 liter of clear, yellow fluid was removed. Samples were sent to the laboratory as requested by the clinical team.  IMPRESSION: Successful ultrasound-guided diagnostic paracentesis yielding 1 liter of peritoneal fluid.  Read by: Jeananne RamaKevin Allred, PA-C   Electronically Signed   By: Corlis Leak  Hassell M.D.   On: 06/04/2014 11:53   Dg Chest Port 1 View  06/04/2014   CLINICAL DATA:  Headache and abdomen pain for 2 days  EXAM: PORTABLE CHEST - 1 VIEW  COMPARISON:  05/27/2014  FINDINGS: A single AP portable view of the chest demonstrates no focal airspace consolidation or alveolar edema. The lungs are grossly clear. There is no large effusion or pneumothorax. Cardiac and mediastinal contours appear unremarkable.  IMPRESSION: No active disease.   Electronically Signed   By: Ellery Plunkaniel R Mitchell M.D.   On: 06/04/2014 02:07   Mr Abd W/wo Cm/mrcp  06/04/2014   CLINICAL DATA:  Autoimmune hepatitis with primary sclerosing cholangitis. Varices and ascites. [facility] Neutropenia. Abdominal pain with nausea and vomiting along with fever. Increasing  total bilirubin. Jaundice.  EXAM: MRI ABDOMEN WITHOUT AND WITH CONTRAST (INCLUDING MRCP)  TECHNIQUE: Multiplanar multisequence MR imaging of the abdomen was performed both before and after the administration of intravenous contrast. Heavily T2-weighted images of the biliary and pancreatic ducts were obtained, and three-dimensional MRCP images were rendered by post processing.  CONTRAST:  10 cc MultiHance  COMPARISON:  Multiple exams,  including 01/12/2014, 05/27/2014, and 06/04/2014  FINDINGS: Despite efforts by the technologist and patient, motion artifact is present on today's exam and could not be eliminated. This reduces exam sensitivity and specificity.  Lower chest:  Unremarkable  Hepatobiliary: Small nodular liver. Scattered high linear T2 signal suggesting fibrosis. No focal enhancing mass to suggest hepatocellular carcinoma. No findings of biliary obstruction. Contracted gallbladder with borderline wall thickening.  Pancreas: Generally poor pancreatic enhancement but without pancreatic mass observed.  Spleen: Splenomegaly, splenic volume calculated at 1100 cc. 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  Adrenals/Urinary Tract: Adrenal glands unremarkable although left adrenal partially obscured by surrounding varices. Kidneys intact were visualized.  Stomach/Bowel: Wall thickening in the colon. This wall thickening is improved compared to 01/12/14, but not completely resolved.  Vascular/Lymphatic: Diminutive portal vein. Splenic vein patent. Extensive ascites along the omentum, splenic hilum, stomach fundus, and along the medial margin of the spleen.  Other: Massive ascites.  Musculoskeletal: Small umbilical hernia observed on image 38 series 3 with internal fluid and conceivably a small knuckle of bowel.  IMPRESSION: 1. 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 Splenomegaly with overall splenic volume at 1100 cc. 2. Essentially stable varices.  Massive ascites. 3. There still some wall thickening in the colon, which suggests hypoproteinemia as a possible underlying etiology, although the wall thickening is less striking than was present on 01/12/2014. 4. Cirrhotic liver. No focal hepatic mass to suggest hepatocellular carcinoma. 5. Somewhat diminutive portal vein, but otherwise patent. 6. Small umbilical hernia contains internal fluid and conceivably  a small knuckle of bowel. 7. No extrahepatic biliary dilatation.   Electronically Signed   By: Gaylyn RongWalter  Liebkemann M.D.   On: 06/04/2014 14:34     Medications: Scheduled Meds: . albumin human  50 g Intravenous Once  . feeding supplement (RESOURCE BREEZE)  1 Container Oral TID BM  . furosemide  40 mg Oral Daily  . hydrocortisone sod succinate (SOLU-CORTEF) inj  25 mg Intravenous Q12H  . imipenem-cilastatin  250 mg Intravenous Q6H  . lactulose  20 g Oral BID  . predniSONE  5 mg Oral Daily  . spironolactone  100 mg Oral Daily  . ursodiol  600 mg Oral Daily  . vancomycin  750 mg Intravenous Q12H   Continuous Infusions:  PRN Meds:.alum & mag hydroxide-simeth, ondansetron **OR** ondansetron (ZOFRAN) IV, oxyCODONE    Assessment/Plan: 24 y.o. female with AIC/PSC overlap, cirrhosis, here with abd pain and increased LFTS  MRI, MRCP yesterday does not show biliary stricture, dilation.  Paracentesis shows NO sign of infection.  CXR was normal.  UA showed no sign of UTI. Her acute illness is not from clear infection but I agree she should remain on Abx. Perhaps this is just worsening of her underlying cirrhosis, AIH.  Her transplant hepatologist has asked that she be transferred there and that is in the works now. Current UNOS MELD is 21.    Rachael FeeJacobs, Fawne Hughley P, MD  06/05/2014, 11:26 AM [facility] Pager [phone]

## 2014-06-06 LAB — CBC WITH DIFFERENTIAL/PLATELET
BASOS PCT: 0 % (ref 0–1)
Basophils Absolute: 0 10*3/uL (ref 0.0–0.1)
EOS ABS: 0 10*3/uL (ref 0.0–0.7)
EOS PCT: 0 % (ref 0–5)
HCT: 24.5 % — ABNORMAL LOW (ref 36.0–46.0)
Hemoglobin: 7.9 g/dL — ABNORMAL LOW (ref 12.0–15.0)
Lymphocytes Relative: 6 % — ABNORMAL LOW (ref 12–46)
Lymphs Abs: 0.2 10*3/uL — ABNORMAL LOW (ref 0.7–4.0)
MCH: 29 pg (ref 26.0–34.0)
MCHC: 32.2 g/dL (ref 30.0–36.0)
MCV: 90.1 fL (ref 78.0–100.0)
Monocytes Absolute: 0.2 10*3/uL (ref 0.1–1.0)
Monocytes Relative: 9 % (ref 3–12)
NEUTROS PCT: 84 % — AB (ref 43–77)
Neutro Abs: 2 10*3/uL (ref 1.7–7.7)
PLATELETS: 33 10*3/uL — AB (ref 150–400)
RBC: 2.72 MIL/uL — ABNORMAL LOW (ref 3.87–5.11)
RDW: 19 % — ABNORMAL HIGH (ref 11.5–15.5)
WBC: 2.3 10*3/uL — ABNORMAL LOW (ref 4.0–10.5)

## 2014-06-06 LAB — COMPREHENSIVE METABOLIC PANEL
ALT: 33 U/L (ref 14–54)
AST: 42 U/L — AB (ref 15–41)
Albumin: 2.5 g/dL — ABNORMAL LOW (ref 3.5–5.0)
Alkaline Phosphatase: 242 U/L — ABNORMAL HIGH (ref 38–126)
Anion gap: 7 (ref 5–15)
BILIRUBIN TOTAL: 8.1 mg/dL — AB (ref 0.3–1.2)
BUN: 9 mg/dL (ref 6–20)
CHLORIDE: 102 mmol/L (ref 101–111)
CO2: 26 mmol/L (ref 22–32)
Calcium: 8.2 mg/dL — ABNORMAL LOW (ref 8.9–10.3)
Creatinine, Ser: 0.69 mg/dL (ref 0.44–1.00)
GFR calc Af Amer: >60 mL/min (ref >60)
GLUCOSE: 148 mg/dL — AB (ref 70–99)
Potassium: 3.3 mmol/L — ABNORMAL LOW (ref 3.5–5.1)
Sodium: 135 mmol/L (ref 135–145)
Total Protein: 5.3 g/dL — ABNORMAL LOW (ref 6.5–8.1)

## 2014-06-06 LAB — AMMONIA: Ammonia: 61 umol/L — ABNORMAL HIGH (ref 9–35)

## 2014-06-06 LAB — URINE CULTURE

## 2014-06-06 LAB — PATHOLOGIST SMEAR REVIEW

## 2014-06-06 MED ORDER — POTASSIUM CHLORIDE CRYS ER 20 MEQ PO TBCR
40.0000 meq | EXTENDED_RELEASE_TABLET | Freq: Once | ORAL | Status: AC
  Administered 2014-06-06: 40 meq via ORAL
  Filled 2014-06-06: qty 2

## 2014-06-06 MED ORDER — LACTULOSE 10 GM/15ML PO SOLN
30.0000 g | Freq: Two times a day (BID) | ORAL | Status: DC
  Administered 2014-06-06 – 2014-06-08 (×4): 30 g via ORAL
  Filled 2014-06-06 (×5): qty 45

## 2014-06-06 NOTE — Progress Notes (Signed)
Patient's mother concerned about patient's ammonia levels. Patient has been asking repeat questions throughout the shift. MD notified. Orders received.

## 2014-06-06 NOTE — Progress Notes (Signed)
Ammonia level 61, Dr. Craige CottaKirby notified as ordered, will continue to monitor.

## 2014-06-06 NOTE — Progress Notes (Signed)
PATIENT DETAILS Name: Melanie Conley Age: 24 y.o. Sex: female Date of Birth: [birth year] Admit Date: 06/03/2014 Admitting Physician Ron Parker, MD EAV:[email or web address],JYNW, MD  Brief narrative:  Unfortunate 24 year old female with history of autoimmune hepatitis and 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 Found to have significant elevation in total bilirubin levels. Workup in progress to rule out cholangitis and subacute bacterial peritonitis. Gastroenterology has been consulted.  Subjective: No further abdominal pain. Complains of fullness in the abdomen.  Assessment/Plan: Principal Problem:   Sepsis:initial suspicion for either acute cholangitis or subacute bacterial peritonitis. Started on vancomycin and Primaxin as empiric coverage.Underwent diagnostic paracentesis not consistent with SBP. MRCP negative for signigicant abnormalities-no evidence of cholangitis. Await final culture results-etiology of sepsis is uncertain at this point, spoke with Transplant/Hepatology service at [facility] on 5/8-accepted as transfer. In interim continue with Vanco/Primaxin.    Jaundice: mostly mostly direct hyperbilirubinemia-? Progression of autoimmune hepatitis. No biliary dilatation seen on MRCP. As noted above being transferred to [facility] [place] for evaluation by Hepatology. Bilirubin levels are slightly decreased compared to yesterday   Headache: Suspect secondary to sepsis from GI this has completely resolved.   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. On chronic prednisone and Imuran along with Ursodiol. Received stress dosing of steroids for  sepsis and relative hypotension-plans to slowly transition back to her usual dosing of prednisone. Because of leukopenia, Imuran remains on hold. Since BP stable, diuretics resumed.   Prior history of SBP and grade 2 esophageal varices: Secondary to above  Disposition: Remain inpatient-awaiting transfer to [facility]  Antimicrobial agents  See below  Anti-infectives    Start     Dose/Rate Route Frequency Ordered Stop   06/05/14 0000  imipenem-cilastatin 250 mg in sodium chloride 0.9 % 100 mL     250 mg 200 mL/hr over 30 Minutes Intravenous Every 6 hours 06/05/14 1113     06/05/14 0000  Vancomycin (VANCOCIN) 750 MG/150ML SOLN     750 mg 150 mL/hr over 60 Minutes Intravenous Every 12 hours 06/05/14 1113     06/04/14 1800  vancomycin (VANCOCIN) IVPB 750 mg/150 ml premix     750 mg 150 mL/hr over 60 Minutes Intravenous Every 12 hours 06/04/14 1122     06/04/14 1030  imipenem-cilastatin (PRIMAXIN) 250 mg in sodium chloride 0.9 % 100 mL IVPB     250 mg 200 mL/hr over 30 Minutes Intravenous Every 6 hours 06/04/14 1017     06/04/14 0345  vancomycin (VANCOCIN) IVPB 1000 mg/200 mL premix     1,000 mg 200 mL/hr over 60 Minutes Intravenous  Once 06/04/14 0344 06/04/14 0541   06/04/14 0345  piperacillin-tazobactam (ZOSYN) IVPB 3.375 g     3.375 g 100 mL/hr over 30 Minutes Intravenous  Once 06/04/14 0344 06/04/14 0429      DVT Prophylaxis:  SCD's  Code Status: Full code   Family Communication Mother at bedside on 5/8-none today  Procedures: Paracentesis-5/7  CONSULTS:  GI  Time spent 20 minutes-which includes 50% of the time with face-to-face with patient/ family and coordinating care related to the above assessment and plan.  MEDICATIONS: Scheduled Meds: . albumin  human  50 g Intravenous Once  . feeding supplement (RESOURCE BREEZE)  1 Container Oral TID BM  . furosemide  40 mg Oral Daily  . hydrocortisone sod succinate (SOLU-CORTEF) inj  25 mg Intravenous Q12H  .  imipenem-cilastatin  250 mg Intravenous Q6H  . lactulose  20 g Oral BID  . predniSONE  5 mg Oral Daily  . spironolactone  100 mg Oral Daily  . ursodiol  600 mg Oral Daily  . vancomycin  750 mg Intravenous Q12H   Continuous Infusions:   PRN Meds:.alum & mag hydroxide-simeth, ondansetron **OR** ondansetron (ZOFRAN) IV, oxyCODONE    PHYSICAL EXAM: Vital signs in last 24 hours: Filed Vitals:   06/05/14 0622 06/05/14 1430 06/05/14 2113 06/06/14 0531  BP: 102/62 98/57 95/62  107/57  Pulse: 51 55 63   Temp: 97.6 F (36.4 C) 98.2 F (36.8 C) 98 F (36.7 C) 97.9 F (36.6 C)  TempSrc: Oral Oral  Oral  Resp: 17 16 18 18   Height:      Weight:      SpO2: 96% 96% 98% 97%    Weight change:  Filed Weights   06/04/14 0508  Weight: 54.795 kg (120 lb 12.8 oz)   Body mass index is 24.39 kg/(m^2).   Gen Exam: Awake and alert with clear speech-but looks chronically sick and frail. Neck: Supple Chest: B/L Clear. No rales or rhonchi   CVS: S1 S2 Regular, no murmurs.  Abdomen: soft, BS +, non-tender some distention from ascites but not tense Extremities: 1+ edema, lower extremities warm to touch. Neurologic: Non Focal.   Skin: No Rash.   Wounds: N/A.    Intake/Output from previous day:  Intake/Output Summary (Last 24 hours) at 06/06/14 1054 Last data filed at 06/06/14 1046  Gross per 24 hour  Intake    357 ml  Output      0 ml  Net    357 ml     LAB RESULTS: CBC  Recent Labs Lab 06/03/14 2357 06/05/14 0559 06/06/14 0448  WBC 2.6* 2.2* 2.3*  HGB 10.9* 8.3* 7.9*  HCT 33.1* 25.2* 24.5*  PLT 49* 31* 33*  MCV 88.5 88.4 90.1  MCH 29.1 29.1 29.0  MCHC 32.9 32.9 32.2  RDW 19.4* 19.1* 19.0*  LYMPHSABS 1.2  --  0.2*  MONOABS 0.4  --  0.2  EOSABS 0.0  --  0.0  BASOSABS 0.0  --  0.0    Chemistries   Recent Labs Lab 06/03/14 2357 06/05/14 0559 06/06/14 0448  NA 137 136 135  K 3.9 3.9 3.3*  CL 105 107 102  CO2 23 21* 26  GLUCOSE 88 158* 148*  BUN 14 9 9     CREATININE 0.98 0.64 0.69  CALCIUM 7.8* 8.1* 8.2*    CBG: No results for input(s): GLUCAP in the last 168 hours.  GFR Estimated Creatinine Clearance: 82.5 mL/min (by C-G formula based on Cr of 0.69).  Coagulation profile  Recent Labs Lab 06/04/14 0016 06/05/14 0559  INR 1.77* 1.77*    Cardiac Enzymes No results for input(s): CKMB, TROPONINI, MYOGLOBIN in the last 168 hours.  Invalid input(s): CK  Invalid input(s): POCBNP No results for input(s): DDIMER in the last 72 hours. No results for input(s): HGBA1C in the last 72 hours. No results for input(s): CHOL, HDL, LDLCALC, TRIG, CHOLHDL, LDLDIRECT in the last 72 hours. No results for input(s): TSH, T4TOTAL, T3FREE, THYROIDAB in the last 72 hours.  Invalid input(s): FREET3 No results for input(s): VITAMINB12, FOLATE, FERRITIN,  TIBC, IRON, RETICCTPCT in the last 72 hours.  Recent Labs  06/03/14 2357  LIPASE 42    Urine Studies No results for input(s): UHGB, CRYS in the last 72 hours.  Invalid input(s): UACOL, UAPR, USPG, UPH, UTP, UGL, UKET, UBIL, UNIT, UROB, ULEU, UEPI, UWBC, URBC, UBAC, CAST, UCOM, BILUA  MICROBIOLOGY: Recent Results (from the past 240 hour(s))  Body fluid culture     Status: None   Collection Time: 05/27/14 12:19 PM  Result Value Ref Range Status   Specimen Description PERITONEAL CAVITY  Final   Special Requests NONE  Final   Gram Stain   Final    FEW WBC PRESENT, PREDOMINANTLY PMN NO ORGANISMS SEEN Performed at [facility]    Culture   Final    NO GROWTH 3 DAYS Performed at [facility]    Report Status 05/30/2014 FINAL  Final  Culture, blood (routine x 2)     Status: None (Preliminary result)   Collection Time: 06/04/14 12:16 AM  Result Value Ref Range Status   Specimen Description BLOOD RIGHT ARM  Final   Special Requests BOTTLES DRAWN AEROBIC AND ANAEROBIC  5CC  Final   Culture   Final           BLOOD CULTURE RECEIVED NO GROWTH TO DATE CULTURE WILL BE HELD FOR 5  DAYS BEFORE ISSUING A FINAL NEGATIVE REPORT Performed at [facility]    Report Status PENDING  Incomplete  Culture, blood (routine x 2)     Status: None (Preliminary result)   Collection Time: 06/04/14 12:20 AM  Result Value Ref Range Status   Specimen Description BLOOD LEFT HAND  Final   Special Requests BOTTLES DRAWN AEROBIC AND ANAEROBIC 5CC EACH  Final   Culture   Final           BLOOD CULTURE RECEIVED NO GROWTH TO DATE CULTURE WILL BE HELD FOR 5 DAYS BEFORE ISSUING A FINAL NEGATIVE REPORT Performed at [facility]    Report Status PENDING  Incomplete  Culture, Urine     Status: None (Preliminary result)   Collection Time: 06/04/14  9:20 AM  Result Value Ref Range Status   Specimen Description URINE, CLEAN CATCH  Final   Special Requests Immunocompromised  Final   Colony Count   Final    15,000 COLONIES/ML Performed at [facility]    Culture   Final    ENTEROCOCCUS SPECIES Performed at [facility]    Report Status PENDING  Incomplete  Body fluid culture     Status: None (Preliminary result)   Collection Time: 06/04/14 12:01 PM  Result Value Ref Range Status   Specimen Description PERITONEAL FLUID  Final   Special Requests NONE  Final   Gram Stain   Final    RARE WBC PRESENT,BOTH PMN AND MONONUCLEAR NO ORGANISMS SEEN Performed at [facility]    Culture NO GROWTH Performed at [facility]   Final   Report Status PENDING  Incomplete    RADIOLOGY STUDIES/RESULTS: [place] Paracentesis  06/04/2014   INDICATION: Patient with history of cirrhosis, autoimmune hepatitis with PSC overlap syndrome, recurrent ascites. Request is made for diagnostic paracentesis up to 1 liter to rule out SBP.  EXAM: ULTRASOUND-GUIDED DIAGNOSTIC  PARACENTESIS  COMPARISON:  Prior paracentesis on 05/27/2014  MEDICATIONS: None.  COMPLICATIONS: None immediate  TECHNIQUE: Informed written consent was obtained from the patient after a discussion of the risks,  benefits and alternatives to treatment. A timeout  was performed prior to the initiation of the procedure.  Initial ultrasound scanning demonstrates a moderate to large amount of ascites within the left lower abdominal quadrant. The left lower abdomen was prepped and draped in the usual sterile fashion. 1% lidocaine was used for local anesthesia. Under direct ultrasound guidance, a 19 gauge, 7-cm, Yueh catheter was introduced. An ultrasound image was saved for documentation purposed. The paracentesis was performed. The catheter was removed and a dressing was applied. The patient tolerated the procedure well without immediate post procedural complication.  FINDINGS: A total of approximately 1 liter of clear, yellow fluid was removed. Samples were sent to the laboratory as requested by the clinical team.  IMPRESSION: Successful ultrasound-guided diagnostic paracentesis yielding 1 liter of peritoneal fluid.  Read by: Jeananne RamaKevin Allred, PA-C   Electronically Signed   By: Corlis Leak  Hassell M.D.   On: 06/04/2014 11:53   [place] Paracentesis  05/27/2014   INDICATION: Cirrhosis, autoimmune hepatitis with PSC overlap syndrome, splenomegaly, recurrent ascites. Request is made for diagnostic and therapeutic paracentesis.  EXAM: ULTRASOUND-GUIDED DIAGNOSTIC AND THERAPEUTIC PARACENTESIS  COMPARISON:  Prior paracentesis on 04/07/14  MEDICATIONS: None.  COMPLICATIONS: None immediate  TECHNIQUE: Informed written consent was obtained from the patient after a discussion of the risks, benefits and alternatives to treatment. A timeout was performed prior to the initiation of the procedure.  Initial ultrasound scanning demonstrates a moderate to large amount of ascites within the left lower abdominal quadrant. The left lower abdomen was prepped and draped in the usual sterile fashion. 1% lidocaine was used for local anesthesia. Under direct ultrasound guidance, a 19 gauge, 7-cm, Yueh catheter was introduced. An ultrasound image was saved for  documentation purposed. The paracentesis was performed. The catheter was removed and a dressing was applied. The patient tolerated the procedure well without immediate post procedural complication.  FINDINGS: A total of approximately 4.3 liters of yellow fluid was removed. Samples were sent to the laboratory as requested by the clinical team.  IMPRESSION: Successful ultrasound-guided diagnostic and therapeutic paracentesis yielding 4.3 liters of peritoneal fluid.  Read by: Jeananne RamaKevin Allred, PA-C   Electronically Signed   By: Judie PetitM.  Shick M.D.   On: 05/27/2014 12:09   Dg Chest Port 1 View  06/04/2014   CLINICAL DATA:  Headache and abdomen pain for 2 days  EXAM: PORTABLE CHEST - 1 VIEW  COMPARISON:  05/27/2014  FINDINGS: A single AP portable view of the chest demonstrates no focal airspace consolidation or alveolar edema. The lungs are grossly clear. There is no large effusion or pneumothorax. Cardiac and mediastinal contours appear unremarkable.  IMPRESSION: No active disease.   Electronically Signed   By: Ellery Plunkaniel R Mitchell M.D.   On: 06/04/2014 02:07   Dg Chest Port 1 View  05/27/2014   CLINICAL DATA:  Headache. Shortness of breath. Cough and chest pain for 3 days.  EXAM: PORTABLE CHEST - 1 VIEW  COMPARISON:  01/12/2014  FINDINGS: Mild central airway thickening. The lungs appear otherwise clear. Cardiac and mediastinal margins appear normal. No pleural effusion.  IMPRESSION: 1. Airway thickening is present, suggesting bronchitis or reactive airways disease.   Electronically Signed   By: Gaylyn RongWalter  Liebkemann M.D.   On: 05/27/2014 13:17   Mr Abd W/wo Cm/mrcp  06/04/2014   CLINICAL DATA:  Autoimmune hepatitis with primary sclerosing cholangitis. Varices and ascites. [facility]. Neutropenia. Abdominal pain with nausea and vomiting along with fever. Increasing total bilirubin. Jaundice.  EXAM: MRI ABDOMEN WITHOUT AND WITH CONTRAST (  INCLUDING MRCP)  TECHNIQUE: Multiplanar multisequence MR  imaging of the abdomen was performed both before and after the administration of intravenous contrast. Heavily T2-weighted images of the biliary and pancreatic ducts were obtained, and three-dimensional MRCP images were rendered by post processing.  CONTRAST:  10 cc MultiHance  COMPARISON:  Multiple exams, including 01/12/2014, 05/27/2014, and 06/04/2014  FINDINGS: Despite efforts by the technologist and patient, motion artifact is present on today's exam and could not be eliminated. This reduces exam sensitivity and specificity.  Lower chest:  Unremarkable  Hepatobiliary: Small nodular liver. Scattered high linear T2 signal suggesting fibrosis. No focal enhancing mass to suggest hepatocellular carcinoma. No findings of biliary obstruction. Contracted gallbladder with borderline wall thickening.  Pancreas: Generally poor pancreatic enhancement but without pancreatic mass observed.  Spleen: Splenomegaly, splenic volume calculated at 1100 cc. Peripheral splenic triangular hypodensity, image 46 of series [id number], compatible with in the splenic infarct, size 2.5 by 2.5 cm.  Adrenals/Urinary Tract: Adrenal glands unremarkable although left adrenal partially obscured by surrounding varices. Kidneys intact were visualized.  Stomach/Bowel: Wall thickening in the colon. This wall thickening is improved compared to 01/12/14, but not completely resolved.  Vascular/Lymphatic: Diminutive portal vein. Splenic vein patent. Extensive ascites along the omentum, splenic hilum, stomach fundus, and along the medial margin of the spleen.  Other: Massive ascites.  Musculoskeletal: Small umbilical hernia observed on image 38 series 3 with internal fluid and conceivably a small knuckle of bowel.  IMPRESSION: 1. 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 Splenomegaly with overall splenic volume at 1100 cc. 2. Essentially stable varices.  Massive ascites. 3. There still some wall thickening in the colon, which suggests hypoproteinemia as a  possible underlying etiology, although the wall thickening is less striking than was present on 01/12/2014. 4. Cirrhotic liver. No focal hepatic mass to suggest hepatocellular carcinoma. 5. Somewhat diminutive portal vein, but otherwise patent. 6. Small umbilical hernia contains internal fluid and conceivably a small knuckle of bowel. 7. No extrahepatic biliary dilatation.   Electronically Signed   By: Gaylyn Rong M.D.   On: 06/04/2014 14:34    Jeoffrey Massed, MD  [facility] Pager:[phone]  If 7PM-7AM, please contact night-coverage [email or web address] Password [email or web address] 06/06/2014, 10:54 AM   LOS: 2 days

## 2014-06-06 NOTE — Progress Notes (Signed)
Patient [facility], she will be dc there when bed available since they have been following her there for her liver.

## 2014-06-07 LAB — CBC
HCT: 22.7 % — ABNORMAL LOW (ref 36.0–46.0)
Hemoglobin: 7.3 g/dL — ABNORMAL LOW (ref 12.0–15.0)
MCH: 28.1 pg (ref 26.0–34.0)
MCHC: 32.2 g/dL (ref 30.0–36.0)
MCV: 87.3 fL (ref 78.0–100.0)
Platelets: 30 10*3/uL — ABNORMAL LOW (ref 150–400)
RBC: 2.6 MIL/uL — ABNORMAL LOW (ref 3.87–5.11)
RDW: 18.6 % — ABNORMAL HIGH (ref 11.5–15.5)
WBC: 1.8 10*3/uL — ABNORMAL LOW (ref 4.0–10.5)

## 2014-06-07 LAB — COMPREHENSIVE METABOLIC PANEL
ALT: 37 U/L (ref 14–54)
AST: 50 U/L — ABNORMAL HIGH (ref 15–41)
Albumin: 2.2 g/dL — ABNORMAL LOW (ref 3.5–5.0)
Alkaline Phosphatase: 249 U/L — ABNORMAL HIGH (ref 38–126)
Anion gap: 7 (ref 5–15)
BILIRUBIN TOTAL: 7.4 mg/dL — AB (ref 0.3–1.2)
BUN: 9 mg/dL (ref 6–20)
CALCIUM: 8 mg/dL — AB (ref 8.9–10.3)
CO2: 26 mmol/L (ref 22–32)
Chloride: 101 mmol/L (ref 101–111)
Creatinine, Ser: 0.7 mg/dL (ref 0.44–1.00)
GLUCOSE: 182 mg/dL — AB (ref 70–99)
Potassium: 3.1 mmol/L — ABNORMAL LOW (ref 3.5–5.1)
Sodium: 134 mmol/L — ABNORMAL LOW (ref 135–145)
TOTAL PROTEIN: 5.1 g/dL — AB (ref 6.5–8.1)

## 2014-06-07 MED ORDER — SODIUM CHLORIDE 0.9 % IV SOLN: 3.0000 g | Freq: Four times a day (QID) | INTRAVENOUS | Status: DC

## 2014-06-07 MED ORDER — OXYCODONE HCL 5 MG PO TABS
10.0000 mg | ORAL_TABLET | ORAL | Status: DC | PRN
  Administered 2014-06-07 – 2014-06-08 (×4): 15 mg via ORAL
  Filled 2014-06-07 (×4): qty 3

## 2014-06-07 MED ORDER — METRONIDAZOLE 500 MG PO TABS
500.0000 mg | ORAL_TABLET | Freq: Two times a day (BID) | ORAL | Status: DC
  Administered 2014-06-07 – 2014-06-08 (×3): 500 mg via ORAL
  Filled 2014-06-07 (×4): qty 1

## 2014-06-07 MED ORDER — POTASSIUM CHLORIDE CRYS ER 20 MEQ PO TBCR
40.0000 meq | EXTENDED_RELEASE_TABLET | Freq: Every day | ORAL | Status: DC
  Administered 2014-06-07 – 2014-06-08 (×2): 40 meq via ORAL
  Filled 2014-06-07 (×3): qty 2

## 2014-06-07 MED ORDER — LACTULOSE 10 GM/15ML PO SOLN: 20.0000 g | Freq: Two times a day (BID) | ORAL | Status: DC

## 2014-06-07 MED ORDER — METRONIDAZOLE 500 MG PO TABS: 500.0000 mg | ORAL_TABLET | Freq: Two times a day (BID) | ORAL | Status: DC

## 2014-06-07 MED ORDER — HYDROMORPHONE HCL 1 MG/ML IJ SOLN
0.5000 mg | Freq: Once | INTRAMUSCULAR | Status: AC
  Administered 2014-06-07: 0.5 mg via INTRAVENOUS
  Filled 2014-06-07: qty 1

## 2014-06-07 MED ORDER — MORPHINE SULFATE 2 MG/ML IJ SOLN
1.0000 mg | Freq: Once | INTRAMUSCULAR | Status: AC
  Administered 2014-06-07: 1 mg via INTRAVENOUS
  Filled 2014-06-07: qty 1

## 2014-06-07 MED ORDER — SODIUM CHLORIDE 0.9 % IV SOLN
3.0000 g | Freq: Four times a day (QID) | INTRAVENOUS | Status: DC
  Administered 2014-06-07 – 2014-06-08 (×6): 3 g via INTRAVENOUS
  Filled 2014-06-07 (×8): qty 3

## 2014-06-07 NOTE — Progress Notes (Signed)
PATIENT DETAILS Name: Melanie Conley Age: 24 y.o. 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 Found to have significant elevation in total bilirubin levels. Underwent workup-acute cholangitis and subacute bacterial peritonitis essentially ruled out. Gastroenterology was consulted, and current plans are to transfer to [facility] when a bed is available.   Subjective: Complains of intermittent abdominal pain.  Assessment/Plan: Principal Problem:   Sepsis:initial suspicion for either acute cholangitis or subacute bacterial peritonitis. On admission-started on vancomycin and Primaxin as empiric coverage.Underwent diagnostic paracentesis not consistent with SBP. MRCP negative for signigicant abnormalities-no evidence of cholangitis. Blood and ascites fluid culture continues to be negative. This M.D. spoke with Transplant/Hepatology service at [facility] on 5/8-accepted as transfer. In interim, since all cultures continue to be negative will stop both vancomycin and Primaxin and maintain on Unasyn (urine culture positive for enterococcus-suspect colonization)   Jaundice: mostly mostly direct hyperbilirubinemia-? Progression of autoimmune hepatitis. No biliary dilatation seen on MRCP. As noted above being transferred to [facility] for evaluation by Hepatology. Bilirubin levels are slightly decreased compared to yesterday   Headache: Suspect secondary to sepsis from GI this has completely resolved.   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  Hemoglobin down to 7.3, transfuse if less than 7. Platelet count and WBC also down trending. Continue to follow closely.    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 On chronic prednisone and Imuran along with Ursodiol. Received stress dosing of steroids for sepsis and relative transition back to usual dosing of prednisone. Because of leukopenia, Imuran remains on hold. Since BP stable, diuretics resumed.   Prior history of SBP and grade 2 esophageal varices: Secondary to above    Trichomoniasis: Start Flagyl-70s from 5/10     Urine culture positive for enterococcus: Likely colonization-Will be on antibiotics-would easily cover this  anyway.  Disposition: Remain inpatient-awaiting transfer to [facility]  Antimicrobial agents  See below  Anti-infectives    Start     Dose/Rate Route Frequency Ordered Stop   06/07/14 1300  metroNIDAZOLE (FLAGYL) tablet 500 mg     500 mg Oral Every 12 hours 06/07/14 1245 06/14/14 0959   06/07/14 0000  metroNIDAZOLE (FLAGYL) 500 MG tablet     500 mg Oral Every 12 hours 06/07/14 1246     06/05/14 0000  imipenem-cilastatin 250 mg in sodium chloride 0.9 % 100 mL     250 mg 200 mL/hr over 30 Minutes Intravenous Every 6 hours 06/05/14 1113     06/05/14 0000  Vancomycin (VANCOCIN) 750 MG/150ML SOLN     750 mg 150 mL/hr over 60 Minutes Intravenous Every 12 hours 06/05/14 1113     06/04/14 1800  vancomycin (VANCOCIN) IVPB 750 mg/150 ml premix  Status:  Discontinued     750 mg 150 mL/hr over 60 Minutes Intravenous Every 12 hours 06/04/14 1122 06/07/14 1245   06/04/14 1030  imipenem-cilastatin (PRIMAXIN) 250 mg in sodium chloride 0.9 % 100 mL IVPB  Status:  Discontinued  250 mg 200 mL/hr over 30 Minutes Intravenous Every 6 hours 06/04/14 1017 06/07/14 1245   06/04/14 0345  vancomycin (VANCOCIN) IVPB 1000 mg/200 mL premix     1,000 mg 200 mL/hr over 60  Minutes Intravenous  Once 06/04/14 0344 06/04/14 0541   06/04/14 0345  piperacillin-tazobactam (ZOSYN) IVPB 3.375 g     3.375 g 100 mL/hr over 30 Minutes Intravenous  Once 06/04/14 0344 06/04/14 0429      DVT Prophylaxis:  SCD's  Code Status: Full code   Family Communication None at bedside today  Procedures: Paracentesis-5/7  CONSULTS:  GI  Time spent 20 minutes-which includes 50% of the time with face-to-face with patient/ family and coordinating care related to the above assessment and plan.  MEDICATIONS: Scheduled Meds: . albumin human  50 g Intravenous Once  . feeding supplement (RESOURCE BREEZE)  1 Container Oral TID BM  . furosemide  40 mg Oral Daily  . lactulose  30 g Oral BID  . metroNIDAZOLE  500 mg Oral Q12H  . potassium chloride  40 mEq Oral Daily  . predniSONE  5 mg Oral Daily  . spironolactone  100 mg Oral Daily  . ursodiol  600 mg Oral Daily   Continuous Infusions:   PRN Meds:.alum & mag hydroxide-simeth, ondansetron **OR** ondansetron (ZOFRAN) IV, oxyCODONE    PHYSICAL EXAM: Vital signs in last 24 hours: Filed Vitals:   06/06/14 0531 06/06/14 1353 06/06/14 2116 06/07/14 0607  BP: 107/57 100/60 110/72 117/68  Pulse:  73 67 57  Temp: 97.9 F (36.6 C) 98.3 F (36.8 C) 98.6 F (37 C) 98.1 F (36.7 C)  TempSrc: Oral Oral Oral Oral  Resp: Height:      Weight:      SpO2: 97% 95% 98% 99%    Weight change:  Filed Weights   06/04/14 0508  Weight: 54.795 kg (120 lb 12.8 oz)   Body mass index is 24.39 kg/(m^2).   Gen Exam: Awake and alert with clear speech-but looks chronically sick and frail. Neck: Supple Chest: B/L Clear. No rales or rhonchi   CVS: S1 S2 Regular, no murmurs.  Abdomen: soft, BS +, non-tender some distention from ascites but not tense Extremities: 1+ edema, lower extremities warm to touch. Neurologic: Non Focal.   Skin: No Rash.   Wounds: N/A.    Intake/Output from previous day:  Intake/Output Summary  (Last 24 hours) at 06/07/14 1247 Last data filed at 06/07/14 1053  Gross per 24 hour  Intake   1060 ml  Output   1075 ml  Net    -15 ml     LAB RESULTS: CBC  Recent Labs Lab 06/03/14 2357 06/05/14 0559 06/06/14 0448 06/07/14 0546  WBC 2.6* 2.2* 2.3* 1.8*  HGB 10.9* 8.3* 7.9* 7.3*  HCT 33.1* 25.2* 24.5* 22.7*  PLT 49* 31* 33* 30*  MCV 88.5 88.4 90.1 87.3  MCH 29.1 29.1 29.0 28.1  MCHC 32.9 32.9 32.2 32.2  RDW 19.4* 19.1* 19.0* 18.6*  LYMPHSABS 1.2  --  0.2*  --   MONOABS 0.4  --  0.2  --   EOSABS 0.0  --  0.0  --   BASOSABS 0.0  --  0.0  --     Chemistries   Recent Labs Lab 06/03/14 2357 06/05/14 0559 06/06/14 0448 06/07/14 0546  NA 137 136 135 134*  K 3.9 3.9 3.3* 3.1*  CL 105 107 102 101  CO2 23 21* 26 26  GLUCOSE 88  158* 148* 182*  BUN 14 9 9 9   CREATININE 0.98 0.64 0.69 0.70  CALCIUM 7.8* 8.1* 8.2* 8.0*    CBG: No results for input(s): GLUCAP in the last 168 hours.  GFR Estimated Creatinine Clearance: 82.5 mL/min (by C-G formula based on Cr of 0.7).  Coagulation profile  Recent Labs Lab 06/04/14 0016 06/05/14 0559  INR 1.77* 1.77*    Cardiac Enzymes No results for input(s): CKMB, TROPONINI, MYOGLOBIN in the last 168 hours.  Invalid input(s): CK  Invalid input(s): POCBNP No results for input(s): DDIMER in the last 72 hours. No results for input(s): HGBA1C in the last 72 hours. No results for input(s): CHOL, HDL, LDLCALC, TRIG, CHOLHDL, LDLDIRECT in the last 72 hours. No results for input(s): TSH, T4TOTAL, T3FREE, THYROIDAB in the last 72 hours.  Invalid input(s): FREET3 No results for input(s): VITAMINB12, FOLATE, FERRITIN, TIBC, IRON, RETICCTPCT in the last 72 hours. No results for input(s): LIPASE, AMYLASE in the last 72 hours.  Urine Studies No results for input(s): UHGB, CRYS in the last 72 hours.  Invalid input(s): UACOL, UAPR, USPG, UPH, UTP, UGL, UKET, UBIL, UNIT, UROB, ULEU, UEPI, UWBC, URBC, UBAC, CAST, UCOM,  BILUA  MICROBIOLOGY: Recent Results (from the past 240 hour(s))  Culture, blood (routine x 2)     Status: None (Preliminary result)   Collection Time: 06/04/14 12:16 AM  Result Value Ref Range Status   Specimen Description BLOOD RIGHT ARM  Final   Special Requests BOTTLES DRAWN AEROBIC AND ANAEROBIC  5CC  Final   Culture   Final           BLOOD CULTURE RECEIVED NO GROWTH TO DATE CULTURE WILL BE HELD FOR 5 DAYS BEFORE ISSUING A FINAL NEGATIVE REPORT Performed at [facility]    Report Status PENDING  Incomplete  Culture, blood (routine x 2)     Status: None (Preliminary result)   Collection Time: 06/04/14 12:20 AM  Result Value Ref Range Status   Specimen Description BLOOD LEFT HAND  Final   Special Requests BOTTLES DRAWN AEROBIC AND ANAEROBIC 5CC EACH  Final   Culture   Final           BLOOD CULTURE RECEIVED NO GROWTH TO DATE CULTURE WILL BE HELD FOR 5 DAYS BEFORE ISSUING A FINAL NEGATIVE REPORT Performed at [facility]    Report Status PENDING  Incomplete  Culture, Urine     Status: None   Collection Time: 06/04/14  9:20 AM  Result Value Ref Range Status   Specimen Description URINE, CLEAN CATCH  Final   Special Requests Immunocompromised  Final   Colony Count   Final    15,000 COLONIES/ML Performed at [facility]    Culture   Final    ENTEROCOCCUS SPECIES Performed at [facility]    Report Status 06/06/2014 FINAL  Final   Organism ID, Bacteria ENTEROCOCCUS SPECIES  Final      Susceptibility   Enterococcus species - MIC*    AMPICILLIN <=2 SENSITIVE Sensitive     LEVOFLOXACIN 2 SENSITIVE Sensitive     NITROFURANTOIN <=16 SENSITIVE Sensitive     VANCOMYCIN 2 SENSITIVE Sensitive     TETRACYCLINE <=1 SENSITIVE Sensitive     * ENTEROCOCCUS SPECIES  Body fluid culture     Status: None (Preliminary result)   Collection Time: 06/04/14 12:01 PM  Result Value Ref Range Status   Specimen Description PERITONEAL FLUID  Final   Special Requests  NONE  Final  Gram Stain   Final    RARE WBC PRESENT,BOTH PMN AND MONONUCLEAR NO ORGANISMS SEEN [facility]    Culture   Final    NO GROWTH 2 DAYS Performed at [facility]    Report Status PENDING  Incomplete    RADIOLOGY STUDIES/RESULTS: [place] Paracentesis  06/04/2014   INDICATION: Patient with history of cirrhosis, autoimmune hepatitis with PSC overlap syndrome, recurrent ascites. Request is made for diagnostic paracentesis up to 1 liter to rule out SBP.  EXAM: ULTRASOUND-GUIDED DIAGNOSTIC  PARACENTESIS  COMPARISON:  Prior paracentesis on 05/27/2014  MEDICATIONS: None.  COMPLICATIONS: None immediate  TECHNIQUE: Informed written consent was obtained from the patient after a discussion of the risks, benefits and alternatives to treatment. A timeout was performed prior to the initiation of the procedure.  Initial ultrasound scanning demonstrates a moderate to large amount of ascites within the left lower abdominal quadrant. The left lower abdomen was prepped and draped in the usual sterile fashion. 1% lidocaine was used for local anesthesia. Under direct ultrasound guidance, a 19 gauge, 7-cm, Yueh catheter was introduced. An ultrasound image was saved for documentation purposed. The paracentesis was performed. The catheter was removed and a dressing was applied. The patient tolerated the procedure well without immediate post procedural complication.  FINDINGS: A total of approximately 1 liter of clear, yellow fluid was removed. Samples were sent to the laboratory as requested by the clinical team.  IMPRESSION: Successful ultrasound-guided diagnostic paracentesis yielding 1 liter of peritoneal fluid.  Read by: Jeananne RamaKevin Allred, PA-C   Electronically Signed   By: Corlis Leak  Hassell M.D.   On: 06/04/2014 11:53   [place] Paracentesis  05/27/2014   INDICATION: Cirrhosis, autoimmune hepatitis with PSC overlap syndrome, splenomegaly, recurrent ascites. Request is made for diagnostic and  therapeutic paracentesis.  EXAM: ULTRASOUND-GUIDED DIAGNOSTIC AND THERAPEUTIC PARACENTESIS  COMPARISON:  Prior paracentesis on 04/07/14  MEDICATIONS: None.  COMPLICATIONS: None immediate  TECHNIQUE: Informed written consent was obtained from the patient after a discussion of the risks, benefits and alternatives to treatment. A timeout was performed prior to the initiation of the procedure.  Initial ultrasound scanning demonstrates a moderate to large amount of ascites within the left lower abdominal quadrant. The left lower abdomen was prepped and draped in the usual sterile fashion. 1% lidocaine was used for local anesthesia. Under direct ultrasound guidance, a 19 gauge, 7-cm, Yueh catheter was introduced. An ultrasound image was saved for documentation purposed. The paracentesis was performed. The catheter was removed and a dressing was applied. The patient tolerated the procedure well without immediate post procedural complication.  FINDINGS: A total of approximately 4.3 liters of yellow fluid was removed. Samples were sent to the laboratory as requested by the clinical team.  IMPRESSION: Successful ultrasound-guided diagnostic and therapeutic paracentesis yielding 4.3 liters of peritoneal fluid.  Read by: Jeananne RamaKevin Allred, PA-C   Electronically Signed   By: Judie PetitM.  Shick M.D.   On: 05/27/2014 12:09   Dg Chest Port 1 View  06/04/2014   CLINICAL DATA:  Headache and abdomen pain for 2 days  EXAM: PORTABLE CHEST - 1 VIEW  COMPARISON:  05/27/2014  FINDINGS: A single AP portable view of the chest demonstrates no focal airspace consolidation or alveolar edema. The lungs are grossly clear. There is no large effusion or pneumothorax. Cardiac and mediastinal contours appear unremarkable.  IMPRESSION: No active disease.   Electronically Signed   By: Ellery Plunkaniel R Mitchell M.D.   On: 06/04/2014 02:07   [facility]  05/27/2014   CLINICAL DATA:  Headache. Shortness of breath. Cough and chest pain for 3 days.  EXAM: PORTABLE  CHEST - 1 VIEW  COMPARISON:  01/12/2014  FINDINGS: Mild central airway thickening. The lungs appear otherwise clear. Cardiac and mediastinal margins appear normal. No pleural effusion.  IMPRESSION: 1. Airway thickening is present, suggesting bronchitis or reactive airways disease.   Electronically Signed   By: Gaylyn RongWalter  Liebkemann M.D.   On: 05/27/2014 13:17   Mr Abd W/wo Cm/mrcp  06/04/2014   CLINICAL DATA:  Autoimmune hepatitis with primary sclerosing cholangitis. Varices and ascites. [facility]. Neutropenia. Abdominal pain with nausea and vomiting along with fever. Increasing total bilirubin. Jaundice.  EXAM: MRI ABDOMEN WITHOUT AND WITH CONTRAST (INCLUDING MRCP)  TECHNIQUE: Multiplanar multisequence MR imaging of the abdomen was performed both before and after the administration of intravenous contrast. Heavily T2-weighted images of the biliary and pancreatic ducts were obtained, and three-dimensional MRCP images were rendered by post processing.  CONTRAST:  10 cc MultiHance  COMPARISON:  Multiple exams, including 01/12/2014, 05/27/2014, and 06/04/2014  FINDINGS: Despite efforts by the technologist and patient, motion artifact is present on today's exam and could not be eliminated. This reduces exam sensitivity and specificity.  Lower chest:  Unremarkable  Hepatobiliary: Small nodular liver. Scattered high linear T2 signal suggesting fibrosis. No focal enhancing mass to suggest hepatocellular carcinoma. No findings of biliary obstruction. Contracted gallbladder with borderline wall thickening.  Pancreas: Generally poor pancreatic enhancement but without pancreatic mass observed.  Spleen: Splenomegaly, splenic volume calculated at 1100 cc. 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  Adrenals/Urinary Tract: Adrenal glands unremarkable although left adrenal partially obscured by surrounding  varices. Kidneys intact were visualized.  Stomach/Bowel: Wall thickening in the colon. This wall thickening is improved compared to 01/12/14, but not completely resolved.  Vascular/Lymphatic: Diminutive portal vein. Splenic vein patent. Extensive ascites along the omentum, splenic hilum, stomach fundus, and along the medial margin of the spleen.  Other: Massive ascites.  Musculoskeletal: Small umbilical hernia observed on image 38 series 3 with internal fluid and conceivably a small knuckle of bowel.  IMPRESSION: 1. 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 Splenomegaly with overall splenic volume at 1100 cc. 2. Essentially stable varices.  Massive ascites. 3. There still some wall thickening in the colon, which suggests hypoproteinemia as a possible underlying etiology, although the wall thickening is less striking than was present on 01/12/2014. 4. Cirrhotic liver. No focal hepatic mass to suggest hepatocellular carcinoma. 5. Somewhat diminutive portal vein, but otherwise patent. 6. Small umbilical hernia contains internal fluid and conceivably a small knuckle of bowel. 7. No extrahepatic biliary dilatation.   Electronically Signed   By: Gaylyn RongWalter  Liebkemann M.D.   On: 06/04/2014 14:34    Jeoffrey MassedGHIMIRE,SHANKER, MD  [facility] Pager:[phone]  If 7PM-7AM, please contact night-coverage [email or web address] Password [email or web address] 06/07/2014, 12:47 PM   LOS: 3 days

## 2014-06-07 NOTE — Progress Notes (Signed)
ANTIBIOTIC CONSULT NOTE - FOLLOW UP  Pharmacy Consult for Unasyn Indication: Enterococcus UTI  No Known Allergies  Patient Measurements: Height: 4\' 11"  (149.9 cm) Weight: 120 lb 12.8 oz (54.795 kg) IBW/kg (Calculated) : 43.2   Vital Signs: Temp: 98.1 F (36.7 C) (05/10 0607) Temp Source: Oral (05/10 0607) BP: 117/68 mmHg (05/10 0607) Pulse Rate: 57 (05/10 0607) Intake/Output from previous day: 05/09 0701 - 05/10 0700 In: 1302 [P.O.:952; IV Piggyback:350] Out: 1075 [Urine:1075] Intake/Output from this shift: Total I/O In: 355 [P.O.:355] Out: -   Labs:  Recent Labs  06/05/14 0559 06/06/14 0448 06/07/14 0546  WBC 2.2* 2.3* 1.8*  HGB 8.3* 7.9* 7.3*  PLT 31* 33* 30*  CREATININE 0.64 0.69 0.70   Estimated Creatinine Clearance: 82.5 mL/min (by C-G formula based on Cr of 0.7). No results for input(s): VANCOTROUGH, VANCOPEAK, VANCORANDOM, GENTTROUGH, GENTPEAK, GENTRANDOM, TOBRATROUGH, TOBRAPEAK, TOBRARND, AMIKACINPEAK, AMIKACINTROU, AMIKACIN in the last 72 hours.   Assessment: 23yo with history of autoimmune hepatitis with PSC overlap syndrome presents with abdominal pain, N/V and fever, initially started with vancomycin and primaxin for possible abd sepsis, s/p paracentesis - neg for SBP, afeb currently, wbc low at 1.8, cultures are negative except for pan-sensitive enterococcus in urine. Pharmacy is consulted to switch abx to unasyn. She is also on flagyl for trichomoniasis.   Zosyn x1 Primaxin 5/7>> Vanc 5/7>>  5/7 Blood x 2 - ngtd 5/7 Urine - 15K enterococcus (pan sensitive) 5/8 peritoneal fluid - ngtd  Plan:  - Unasyn 3g IV Q 6 hrs - f/u cultures and renal function.   Bayard HuggerMei Bonnetta Allbee, PharmD, BCPS  Clinical Pharmacist  Pager: [phone]   06/07/2014,1:27 PM

## 2014-06-07 NOTE — Progress Notes (Signed)
Patient complaining of abdominal pain and stating "the pills haven't been helping". Dr. Jerral RalphGhimire notified and verbal order for 1mg  IV morphine once.

## 2014-06-08 DIAGNOSIS — N390 Urinary tract infection, site not specified: Secondary | ICD-10-CM

## 2014-06-08 LAB — COMPREHENSIVE METABOLIC PANEL
ALT: 59 U/L — AB (ref 14–54)
ANION GAP: 12 (ref 5–15)
AST: 83 U/L — ABNORMAL HIGH (ref 15–41)
Albumin: 2.6 g/dL — ABNORMAL LOW (ref 3.5–5.0)
Alkaline Phosphatase: 289 U/L — ABNORMAL HIGH (ref 38–126)
BUN: 9 mg/dL (ref 6–20)
CO2: 29 mmol/L (ref 22–32)
CREATININE: 0.68 mg/dL (ref 0.44–1.00)
Calcium: 8.5 mg/dL — ABNORMAL LOW (ref 8.9–10.3)
Chloride: 96 mmol/L — ABNORMAL LOW (ref 101–111)
GFR calc non Af Amer: >60 mL/min (ref >60)
Glucose, Bld: 143 mg/dL — ABNORMAL HIGH (ref 70–99)
Potassium: 2.6 mmol/L — CL (ref 3.5–5.1)
Sodium: 137 mmol/L (ref 135–145)
Total Bilirubin: 8.4 mg/dL — ABNORMAL HIGH (ref 0.3–1.2)
Total Protein: 6.3 g/dL — ABNORMAL LOW (ref 6.5–8.1)

## 2014-06-08 LAB — BODY FLUID CULTURE: CULTURE: NO GROWTH

## 2014-06-08 LAB — PROTIME-INR
INR: 1.48 (ref 0.00–1.49)
PROTHROMBIN TIME: 18 s — AB (ref 11.6–15.2)

## 2014-06-08 LAB — AMMONIA: AMMONIA: 73 umol/L — AB (ref 9–35)

## 2014-06-08 MED ORDER — POTASSIUM CHLORIDE CRYS ER 20 MEQ PO TBCR
40.0000 meq | EXTENDED_RELEASE_TABLET | ORAL | Status: AC
  Administered 2014-06-08 (×2): 40 meq via ORAL
  Filled 2014-06-08: qty 2

## 2014-06-08 NOTE — Progress Notes (Signed)
Notified that room obtained for patient 6331 Bed 1. Accepting MD Dr Sharon MtMichael Fried accepting for Dr Algis GreenhouseLeonardo Marucci. Patient notified, belongings being packed, [facility] notified of need for transport. Report called to Erskine SquibbJane, RN for room [phone], bed 1. Awaiting transport.

## 2014-06-08 NOTE — Progress Notes (Signed)
PATIENT DETAILS Name: Melanie Conley Age: 24 y.o. 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 Found to have significant elevation in total bilirubin levels. Underwent workup-acute cholangitis and subacute bacterial peritonitis essentially ruled out. Gastroenterology was consulted, and current plans are to transfer to [facility] when a bed is available.   Subjective: Denies pain today, mother at bedside  Assessment/Plan: Principal Problem:   Sepsis:initial suspicion for either acute cholangitis or subacute bacterial peritonitis. On admission-started on vancomycin and Primaxin as empiric coverage.Underwent diagnostic paracentesis not consistent with SBP. MRCP negative for signigicant abnormalities-no evidence of cholangitis. Blood and ascites fluid culture continues to be negative. This M.D. spoke with Transplant/Hepatology service at [facility] on 5/8-accepted as transfer. In interim, since all cultures continue to be negative will stop both vancomycin and Primaxin and maintain on Unasyn (urine culture positive for enterococcus-suspect colonization)   Jaundice: mostly mostly direct hyperbilirubinemia-? Progression of autoimmune hepatitis. No biliary dilatation seen on MRCP. As noted above being transferred to [facility] for evaluation by Hepatology. Bilirubin levels are slightly decreased compared to yesterday   Headache: Suspect secondary to sepsis from GI this has completely resolved.   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 Hemoglobin  down to 7.3, transfuse if less than 7. Platelet count and WBC also down trending. Continue to follow closely.    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 On chronic prednisone and Imuran along with Ursodiol. Received stress dosing of steroids for sepsis and relative transition back to usual dosing of prednisone. Because of leukopenia, Imuran remains on hold. Since BP stable, diuretics resumed.   Prior history of SBP and grade 2 esophageal varices: Secondary to above    Trichomoniasis: Start Flagyl-70s from 5/10     Urine culture positive for enterococcus: Likely colonization-Will be on antibiotics-would easily cover this  anyway.  Disposition: Remain inpatient-awaiting transfer to [facility] Talked to [facility] hepatology attending on 5/11, request speed up transfer due to concerning lab findings   Antimicrobial agents  See below  Anti-infectives    Start     Dose/Rate Route Frequency Ordered Stop   06/07/14 1400  Ampicillin-Sulbactam (UNASYN) 3 g in sodium chloride 0.9 % 100 mL IVPB     3 g 100 mL/hr over 60 Minutes Intravenous Every 6 hours 06/07/14 1315     06/07/14 1330  metroNIDAZOLE (FLAGYL) tablet 500 mg     500 mg Oral Every 12 hours 06/07/14 1245 06/14/14 0959   06/07/14 0000  metroNIDAZOLE (FLAGYL) 500 MG tablet     500 mg Oral Every 12 hours 06/07/14 1246     06/07/14 0000  Ampicillin-Sulbactam 3 g in sodium chloride 0.9 % 100 mL     3 g 100 mL/hr over 60 Minutes Intravenous Every 6 hours 06/07/14 1356     06/05/14 0000  imipenem-cilastatin 250 mg in sodium chloride 0.9 % 100 mL  Status:  Discontinued     250 mg 200 mL/hr over 30 Minutes Intravenous Every 6 hours 06/05/14  1113 06/07/14    06/05/14 0000  Vancomycin (VANCOCIN) 750 MG/150ML SOLN  Status:  Discontinued     750 mg 150 mL/hr over 60 Minutes Intravenous Every 12 hours 06/05/14 1113 06/07/14    06/04/14 1800   vancomycin (VANCOCIN) IVPB 750 mg/150 ml premix  Status:  Discontinued     750 mg 150 mL/hr over 60 Minutes Intravenous Every 12 hours 06/04/14 1122 06/07/14 1245   06/04/14 1030  imipenem-cilastatin (PRIMAXIN) 250 mg in sodium chloride 0.9 % 100 mL IVPB  Status:  Discontinued     250 mg 200 mL/hr over 30 Minutes Intravenous Every 6 hours 06/04/14 1017 06/07/14 1245   06/04/14 0345  vancomycin (VANCOCIN) IVPB 1000 mg/200 mL premix     1,000 mg 200 mL/hr over 60 Minutes Intravenous  Once 06/04/14 0344 06/04/14 0541   06/04/14 0345  piperacillin-tazobactam (ZOSYN) IVPB 3.375 g     3.375 g 100 mL/hr over 30 Minutes Intravenous  Once 06/04/14 0344 06/04/14 0429      DVT Prophylaxis:  SCD's  Code Status: Full code   Family Communication None at bedside today  Procedures: Paracentesis-5/7  CONSULTS:  GI  Time spent 20 minutes-which includes 50% of the time with face-to-face with patient/ family and coordinating care related to the above assessment and plan.  MEDICATIONS: Scheduled Meds: . albumin human  50 g Intravenous Once  . ampicillin-sulbactam (UNASYN) IV  3 g Intravenous Q6H  . feeding supplement (RESOURCE BREEZE)  1 Container Oral TID BM  . furosemide  40 mg Oral Daily  . lactulose  30 g Oral BID  . metroNIDAZOLE  500 mg Oral Q12H  . potassium chloride  40 mEq Oral Daily  . predniSONE  5 mg Oral Daily  . spironolactone  100 mg Oral Daily  . ursodiol  600 mg Oral Daily   Continuous Infusions:   PRN Meds:.alum & mag hydroxide-simeth, ondansetron **OR** ondansetron (ZOFRAN) IV, oxyCODONE    PHYSICAL EXAM: Vital signs in last 24 hours: Filed Vitals:   06/08/14 0330 06/08/14 0559 06/08/14 0857 06/08/14 1514  BP: 109/67 115/60 101/68 113/68  Pulse: 92 76 84 95  Temp: 99 F (37.2 C) 99.2 F (37.3 C) 98.2 F (36.8 C) 99.4 F (37.4 C)  TempSrc: Oral Oral Oral Oral  Resp: Height:      Weight:      SpO2: 92% 94% 95% 97%    Weight change:    Filed Weights   06/04/14 0508  Weight: 54.795 kg (120 lb 12.8 oz)   Body mass index is 24.39 kg/(m^2).   Gen Exam: Awake and alert with clear speech-but looks chronically sick and frail. Neck: Supple Chest: B/L Clear. No rales or rhonchi   CVS: S1 S2 Regular, no murmurs.  Abdomen: soft, BS +, non-tender some distention from ascites but not tense Extremities: 1+ edema, lower extremities warm to touch. Neurologic: Non Focal.   Skin: No Rash.   Wounds: N/A.    Intake/Output from previous day:  Intake/Output Summary (Last 24 hours) at 06/08/14 2050 Last data filed at 06/08/14 1648  Gross per 24 hour  Intake   2700 ml  Output   3725 ml  Net  -1025 ml     LAB RESULTS: CBC  Recent Labs Lab 06/03/14 2357 06/05/14 0559 06/06/14 0448 06/07/14 0546  WBC 2.6* 2.2* 2.3* 1.8*  HGB 10.9* 8.3* 7.9* 7.3*  HCT 33.1* 25.2* 24.5* 22.7*  PLT 49* 31* 33* 30*  MCV 88.5  88.4 90.1 87.3  MCH 29.1 29.1 29.0 28.1  MCHC 32.9 32.9 32.2 32.2  RDW 19.4* 19.1* 19.0* 18.6*  LYMPHSABS 1.2  --  0.2*  --   MONOABS 0.4  --  0.2  --   EOSABS 0.0  --  0.0  --   BASOSABS 0.0  --  0.0  --     Chemistries   Recent Labs Lab 06/03/14 2357 06/05/14 0559 06/06/14 0448 06/07/14 0546 06/08/14 1446  NA 137 136 135 134* 137  K 3.9 3.9 3.3* 3.1* 2.6*  CL 105 107 102 101 96*  CO2 23 21* 26 26 29   GLUCOSE 88 158* 148* 182* 143*  BUN 14 9 9 9 9   CREATININE 0.98 0.64 0.69 0.70 0.68  CALCIUM 7.8* 8.1* 8.2* 8.0* 8.5*    CBG: No results for input(s): GLUCAP in the last 168 hours.  GFR Estimated Creatinine Clearance: 82.5 mL/min (by C-G formula based on Cr of 0.68).  Coagulation profile  Recent Labs Lab 06/04/14 0016 06/05/14 0559 06/08/14 1446  INR 1.77* 1.77* 1.48    Cardiac Enzymes No results for input(s): CKMB, TROPONINI, MYOGLOBIN in the last 168 hours.  Invalid input(s): CK  Invalid input(s): POCBNP No results for input(s): DDIMER in the last 72 hours. No results for input(s):  HGBA1C in the last 72 hours. No results for input(s): CHOL, HDL, LDLCALC, TRIG, CHOLHDL, LDLDIRECT in the last 72 hours. No results for input(s): TSH, T4TOTAL, T3FREE, THYROIDAB in the last 72 hours.  Invalid input(s): FREET3 No results for input(s): VITAMINB12, FOLATE, FERRITIN, TIBC, IRON, RETICCTPCT in the last 72 hours. No results for input(s): LIPASE, AMYLASE in the last 72 hours.  Urine Studies No results for input(s): UHGB, CRYS in the last 72 hours.  Invalid input(s): UACOL, UAPR, USPG, UPH, UTP, UGL, UKET, UBIL, UNIT, UROB, ULEU, UEPI, UWBC, URBC, UBAC, CAST, UCOM, BILUA  MICROBIOLOGY: Recent Results (from the past 240 hour(s))  Culture, blood (routine x 2)     Status: None (Preliminary result)   Collection Time: 06/04/14 12:16 AM  Result Value Ref Range Status   Specimen Description BLOOD RIGHT ARM  Final   Special Requests BOTTLES DRAWN AEROBIC AND ANAEROBIC  5CC  Final   Culture   Final           BLOOD CULTURE RECEIVED NO GROWTH TO DATE CULTURE WILL BE HELD FOR 5 DAYS BEFORE ISSUING A FINAL NEGATIVE REPORT Performed at [facility]    Report Status PENDING  Incomplete  Culture, blood (routine x 2)     Status: None (Preliminary result)   Collection Time: 06/04/14 12:20 AM  Result Value Ref Range Status   Specimen Description BLOOD LEFT HAND  Final   Special Requests BOTTLES DRAWN AEROBIC AND ANAEROBIC 5CC EACH  Final   Culture   Final           BLOOD CULTURE RECEIVED NO GROWTH TO DATE CULTURE WILL BE HELD FOR 5 DAYS BEFORE ISSUING A FINAL NEGATIVE REPORT Performed at [facility]    Report Status PENDING  Incomplete  Culture, Urine     Status: None   Collection Time: 06/04/14  9:20 AM  Result Value Ref Range Status   Specimen Description URINE, CLEAN CATCH  Final   Special Requests Immunocompromised  Final   Colony Count   Final    15,000 COLONIES/ML Performed at [facility]    Culture   Final    ENTEROCOCCUS SPECIES Performed at  [facility]    Report Status 06/06/2014 FINAL  Final   Organism ID, Bacteria ENTEROCOCCUS SPECIES  Final      Susceptibility   Enterococcus species - MIC*    AMPICILLIN <=2 SENSITIVE Sensitive     LEVOFLOXACIN 2 SENSITIVE Sensitive     NITROFURANTOIN <=16 SENSITIVE Sensitive     VANCOMYCIN 2 SENSITIVE Sensitive     TETRACYCLINE <=1 SENSITIVE Sensitive     * ENTEROCOCCUS SPECIES  Body fluid culture     Status: None   Collection Time: 06/04/14 12:01 PM  Result Value Ref Range Status   Specimen Description PERITONEAL FLUID  Final   Special Requests NONE  Final   Gram Stain   Final    RARE WBC PRESENT,BOTH PMN AND MONONUCLEAR NO ORGANISMS SEEN Performed at [facility]    Culture   Final    NO GROWTH 3 DAYS Performed at [facility]    Report Status 06/08/2014 FINAL  Final    RADIOLOGY STUDIES/RESULTS: US Paracentesis  06/04/2014   INDICATION: Patient with history of cirrhosis, autoimmune hepatitis with PSC overlap syndrome, recurrent ascites. Request is made for diagnostic paracentesis up to 1 liter to rule out SBP.  EXAM: ULTRASOUND-GUIDED DIAGNOSTIC  PARACENTESIS  COMPARISON:  Prior paracentesis on 05/27/2014  MEDICATIONS: None.  COMPLICATIONS: None immediate  TECHNIQUE: Informed written consent was obtained from the patient after a discussion of the risks, benefits and alternatives to treatment. A timeout was performed prior to the initiation of the procedure.  Initial ultrasound scanning demonstrates a moderate to large amount of ascites within the left lower abdominal quadrant. The left lower abdomen was prepped and draped in the usual sterile fashion. 1% lidocaine was used for local anesthesia. Under direct ultrasound guidance, a 19 gauge, 7-cm, Yueh catheter was introduced. An ultrasound image was saved for documentation purposed. The paracentesis was performed. The catheter was removed and a dressing was applied. The patient tolerated the procedure well  without immediate post procedural complication.  FINDINGS: A total of approximately 1 liter of clear, yellow fluid was removed. Samples were sent to the laboratory as requested by the clinical team.  IMPRESSION: Successful ultrasound-guided diagnostic paracentesis yielding 1 liter of peritoneal fluid.  Read by: Jeananne Rama, PA-C   Electronically Signed   By: Corlis Leak M.D.   On: 06/04/2014 11:53   US Paracentesis  05/27/2014   INDICATION: Cirrhosis, autoimmune hepatitis with PSC overlap syndrome, splenomegaly, recurrent ascites. Request is made for diagnostic and therapeutic paracentesis.  EXAM: ULTRASOUND-GUIDED DIAGNOSTIC AND THERAPEUTIC PARACENTESIS  COMPARISON:  Prior paracentesis on 04/07/14  MEDICATIONS: None.  COMPLICATIONS: None immediate  TECHNIQUE: Informed written consent was obtained from the patient after a discussion of the risks, benefits and alternatives to treatment. A timeout was performed prior to the initiation of the procedure.  Initial ultrasound scanning demonstrates a moderate to large amount of ascites within the left lower abdominal quadrant. The left lower abdomen was prepped and draped in the usual sterile fashion. 1% lidocaine was used for local anesthesia. Under direct ultrasound guidance, a 19 gauge, 7-cm, Yueh catheter was introduced. An ultrasound image was saved for documentation purposed. The paracentesis was performed. The catheter was removed and a dressing was applied. The patient tolerated the procedure well without immediate post procedural complication.  FINDINGS: A total of approximately 4.3 liters of yellow fluid was removed. Samples were sent to the laboratory as requested by the clinical team.  IMPRESSION: Successful ultrasound-guided diagnostic and therapeutic paracentesis yielding 4.3 liters of  peritoneal fluid.  Read by: Jeananne RamaKevin Allred, PA-C   Electronically Signed   By: Judie PetitM.  Shick M.D.   On: 05/27/2014 12:09   Dg Chest Port 1 View  06/04/2014   CLINICAL DATA:   Headache and abdomen pain for 2 days  EXAM: PORTABLE CHEST - 1 VIEW  COMPARISON:  05/27/2014  FINDINGS: A single AP portable view of the chest demonstrates no focal airspace consolidation or alveolar edema. The lungs are grossly clear. There is no large effusion or pneumothorax. Cardiac and mediastinal contours appear unremarkable.  IMPRESSION: No active disease.   Electronically Signed   By: Ellery Plunkaniel R Mitchell M.D.   On: 06/04/2014 02:07   Dg Chest Port 1 View  05/27/2014   CLINICAL DATA:  Headache. Shortness of breath. Cough and chest pain for 3 days.  EXAM: PORTABLE CHEST - 1 VIEW  COMPARISON:  01/12/2014  FINDINGS: Mild central airway thickening. The lungs appear otherwise clear. Cardiac and mediastinal margins appear normal. No pleural effusion.  IMPRESSION: 1. Airway thickening is present, suggesting bronchitis or reactive airways disease.   Electronically Signed   By: Gaylyn RongWalter  Liebkemann M.D.   On: 05/27/2014 13:17   Mr Abd W/wo Cm/mrcp  06/04/2014   CLINICAL DATA:  Autoimmune hepatitis with primary sclerosing cholangitis. Varices and ascites. [facility]. Neutropenia. Abdominal pain with nausea and vomiting along with fever. Increasing total bilirubin. Jaundice.  EXAM: MRI ABDOMEN WITHOUT AND WITH CONTRAST (INCLUDING MRCP)  TECHNIQUE: Multiplanar multisequence MR imaging of the abdomen was performed both before and after the administration of intravenous contrast. Heavily T2-weighted images of the biliary and pancreatic ducts were obtained, and three-dimensional MRCP images were rendered by post processing.  CONTRAST:  10 cc MultiHance  COMPARISON:  Multiple exams, including 01/12/2014, 05/27/2014, and 06/04/2014  FINDINGS: Despite efforts by the technologist and patient, motion artifact is present on today's exam and could not be eliminated. This reduces exam sensitivity and specificity.  Lower chest:  Unremarkable  Hepatobiliary: Small nodular liver. Scattered high  linear T2 signal suggesting fibrosis. No focal enhancing mass to suggest hepatocellular carcinoma. No findings of biliary obstruction. Contracted gallbladder with borderline wall thickening.  Pancreas: Generally poor pancreatic enhancement but without pancreatic mass observed.  Spleen: Splenomegaly, splenic volume calculated at 1100 cc. 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  Adrenals/Urinary Tract: Adrenal glands unremarkable although left adrenal partially obscured by surrounding varices. Kidneys intact were visualized.  Stomach/Bowel: Wall thickening in the colon. This wall thickening is improved compared to 01/12/14, but not completely resolved.  Vascular/Lymphatic: Diminutive portal vein. Splenic vein patent. Extensive ascites along the omentum, splenic hilum, stomach fundus, and along the medial margin of the spleen.  Other: Massive ascites.  Musculoskeletal: Small umbilical hernia observed on image 38 series 3 with internal fluid and conceivably a small knuckle of bowel.  IMPRESSION: 1. 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 Splenomegaly with overall splenic volume at 1100 cc. 2. Essentially stable varices.  Massive ascites. 3. There still some wall thickening in the colon, which suggests hypoproteinemia as a possible underlying etiology, although the wall thickening is less striking than was present on 01/12/2014. 4. Cirrhotic liver. No focal hepatic mass to suggest hepatocellular carcinoma. 5. Somewhat diminutive portal vein, but otherwise patent. 6. Small umbilical hernia contains internal fluid and conceivably a small knuckle of bowel. 7. No extrahepatic biliary dilatation.   Electronically Signed   By: Zollie BeckersWalter  Ova Freshwater M.D.   On: 06/04/2014 14:34   Time >11mins, greater than 50% time in talking to family and [facility] hepatology attending and coordination of care.  Albertine Grates, MD PhD [facility] Pager:[phone]  [phone]  If 7PM-7AM, please contact night-coverage [email or web address] Password [email or web address] 06/08/2014, 8:50 PM   LOS: 4 days

## 2014-06-08 NOTE — Progress Notes (Signed)
Patient out of facility via stretcher with 2 attendants of [facility]. On way to [facility]. Medicated for pain prior to departure,

## 2014-06-08 NOTE — Progress Notes (Signed)
CRITICAL VALUE ALERT  Critical value received:  K 2.6  Date of notification:  06/08/2014   Time of notification:  4:22 PM   Critical value read back:Yes.    Nurse who received alert:  Ernestina Patchesrton, Tiffine Henigan D   MD notified (1st page):  Dr. Roda ShuttersXu  Time of first page:  4:22 PM   MD notified (2nd page):  Time of second page:  Responding MD:  Dr. Roda ShuttersXu   Time MD responded:  4:22pm

## 2014-06-10 LAB — CULTURE, BLOOD (ROUTINE X 2)
CULTURE: NO GROWTH
Culture: NO GROWTH

## 2014-07-29 ENCOUNTER — Emergency Department (HOSPITAL_COMMUNITY): Payer: BC Managed Care – PPO

## 2014-07-29 ENCOUNTER — Inpatient Hospital Stay (HOSPITAL_COMMUNITY)
Admission: EM | Admit: 2014-07-29 | Discharge: 2014-07-31 | DRG: 421 | Disposition: A | Discharge status: Home / Self Care | Payer: BC Managed Care – PPO | Attending: Internal Medicine | Admitting: Internal Medicine

## 2014-07-29 ENCOUNTER — Encounter (HOSPITAL_COMMUNITY): Payer: Self-pay | Admitting: Emergency Medicine

## 2014-07-29 DIAGNOSIS — K754 Autoimmune hepatitis: Secondary | ICD-10-CM | POA: Diagnosis not present

## 2014-07-29 DIAGNOSIS — K219 Gastro-esophageal reflux disease without esophagitis: Secondary | ICD-10-CM | POA: Diagnosis present

## 2014-07-29 DIAGNOSIS — K8301 Primary sclerosing cholangitis: Secondary | ICD-10-CM | POA: Diagnosis present

## 2014-07-29 DIAGNOSIS — Z79899 Other long term (current) drug therapy: Secondary | ICD-10-CM

## 2014-07-29 DIAGNOSIS — Z7682 Awaiting organ transplant status: Secondary | ICD-10-CM

## 2014-07-29 DIAGNOSIS — E876 Hypokalemia: Secondary | ICD-10-CM | POA: Diagnosis present

## 2014-07-29 DIAGNOSIS — E860 Dehydration: Secondary | ICD-10-CM | POA: Diagnosis present

## 2014-07-29 DIAGNOSIS — D649 Anemia, unspecified: Secondary | ICD-10-CM | POA: Diagnosis present

## 2014-07-29 DIAGNOSIS — K830 Cholangitis: Secondary | ICD-10-CM | POA: Diagnosis present

## 2014-07-29 DIAGNOSIS — K7460 Unspecified cirrhosis of liver: Secondary | ICD-10-CM | POA: Diagnosis present

## 2014-07-29 DIAGNOSIS — Z7952 Long term (current) use of systemic steroids: Secondary | ICD-10-CM

## 2014-07-29 DIAGNOSIS — E43 Unspecified severe protein-calorie malnutrition: Secondary | ICD-10-CM | POA: Diagnosis present

## 2014-07-29 DIAGNOSIS — R109 Unspecified abdominal pain: Secondary | ICD-10-CM | POA: Diagnosis present

## 2014-07-29 DIAGNOSIS — R188 Other ascites: Secondary | ICD-10-CM | POA: Diagnosis present

## 2014-07-29 DIAGNOSIS — B349 Viral infection, unspecified: Secondary | ICD-10-CM | POA: Diagnosis present

## 2014-07-29 DIAGNOSIS — K652 Spontaneous bacterial peritonitis: Secondary | ICD-10-CM

## 2014-07-29 HISTORY — DX: Autoimmune hepatitis: K75.4

## 2014-07-29 HISTORY — DX: Unspecified cirrhosis of liver: K74.60

## 2014-07-29 LAB — CBC WITH DIFFERENTIAL/PLATELET
BASOS ABS: 0 10*3/uL (ref 0.0–0.1)
Basophils Relative: 0 % (ref 0–1)
EOS ABS: 0 10*3/uL (ref 0.0–0.7)
Eosinophils Relative: 0 % (ref 0–5)
HCT: 29.2 % — ABNORMAL LOW (ref 36.0–46.0)
HEMOGLOBIN: 9.7 g/dL — AB (ref 12.0–15.0)
LYMPHS PCT: 4 % — AB (ref 12–46)
Lymphs Abs: 0.3 10*3/uL — ABNORMAL LOW (ref 0.7–4.0)
MCH: 32.8 pg (ref 26.0–34.0)
MCHC: 33.2 g/dL (ref 30.0–36.0)
MCV: 98.6 fL (ref 78.0–100.0)
Monocytes Absolute: 1.5 10*3/uL — ABNORMAL HIGH (ref 0.1–1.0)
Monocytes Relative: 21 % — ABNORMAL HIGH (ref 3–12)
Neutro Abs: 5.5 10*3/uL (ref 1.7–7.7)
Neutrophils Relative %: 75 % (ref 43–77)
PLATELETS: 125 10*3/uL — AB (ref 150–400)
RBC: 2.96 MIL/uL — AB (ref 3.87–5.11)
RDW: 18.9 % — ABNORMAL HIGH (ref 11.5–15.5)
WBC: 7.3 10*3/uL (ref 4.0–10.5)

## 2014-07-29 LAB — I-STAT CHEM 8, ED
BUN: 9 mg/dL (ref 6–20)
Calcium, Ion: 1.06 mmol/L — ABNORMAL LOW (ref 1.12–1.23)
Chloride: 104 mmol/L (ref 101–111)
Creatinine, Ser: 0.7 mg/dL (ref 0.44–1.00)
Glucose, Bld: 105 mg/dL — ABNORMAL HIGH (ref 65–99)
HCT: 31 % — ABNORMAL LOW (ref 36.0–46.0)
Hemoglobin: 10.5 g/dL — ABNORMAL LOW (ref 12.0–15.0)
POTASSIUM: 3.4 mmol/L — AB (ref 3.5–5.1)
SODIUM: 136 mmol/L (ref 135–145)
TCO2: 19 mmol/L (ref 0–100)

## 2014-07-29 LAB — I-STAT CG4 LACTIC ACID, ED: Lactic Acid, Venous: 0.97 mmol/L (ref 0.5–2.0)

## 2014-07-29 LAB — I-STAT BETA HCG BLOOD, ED (MC, WL, AP ONLY): I-stat hCG, quantitative: <5 m[IU]/mL (ref <5)

## 2014-07-29 LAB — PROTIME-INR
INR: 1.39 (ref 0.00–1.49)
Prothrombin Time: 17.2 seconds — ABNORMAL HIGH (ref 11.6–15.2)

## 2014-07-29 NOTE — ED Notes (Signed)
MD at bedside. 

## 2014-07-29 NOTE — ED Notes (Signed)
Patient arrived via EMS with complaints of abdominal pain and headache x 2 days. Fentanyl 100 mcg administered by EMS. Patient denies pain upon arrival.

## 2014-07-29 NOTE — ED Provider Notes (Signed)
CSN: [id number]     Arrival date & time 07/29/14  2239 History  This chart was scribed for Melanie Andreasen, MD by Melanie Conley, ED Scribe. This patient was seen in room A06C/A06C and the patient's care was started at 11:06 PM.      Chief Complaint  Patient presents with  . Abdominal Pain  . Headache    Patient is a 24 y.o. female presenting with abdominal pain and headaches. The history is provided by the patient. No language interpreter was used.  Abdominal Pain Pain location:  Generalized Pain quality: aching   Pain radiates to:  Does not radiate Pain severity:  Mild Onset quality:  Gradual Duration:  1 week Timing:  Constant Progression:  Worsening Chronicity:  Recurrent Context: not alcohol use   Relieved by:  Nothing Worsened by:  Nothing tried Ineffective treatments:  None tried Associated symptoms: diarrhea and fever   Associated symptoms: no chest pain, no constipation, no dysuria, no nausea, no shortness of breath and no vomiting   Risk factors: has not had multiple surgeries and not pregnant   Headache Associated symptoms: abdominal pain, diarrhea and fever   Associated symptoms: no nausea and no vomiting    HPI Comments: Melanie Conley is a 24 y.o. female brought in by ambulance, who presents to the Emergency Department complaining of worsening abdominal pain onset 1 week prior prior. Pt states that she has been complaint with her medications that have not provided any relief. Pt states she has associated subjective fever diarrhea and HA. Pt states that she has recently has a change in her medications. Pt states she usually has relief with her medications but states that today she took her medication and the pain returned within an hour. Denies nausea, vomiting, constipation, dysuria or frequency.    Past Medical History  Diagnosis Date  . Liver disease   . Hepatitis   . Anemia   . Esophageal varices    Past Surgical History  Procedure Laterality Date  .  Gastric banding port revision    . Tonsillectomy    . Esophagogastroduodenoscopy  07/22/2011    Procedure: ESOPHAGOGASTRODUODENOSCOPY (EGD);  Surgeon: Melanie Shiver, MD;  Location: [facility] ENDOSCOPY;  Service: Endoscopy;  Laterality: N/A;   Family History  Problem Relation Age of Onset  . Adopted: Yes   History  Substance Use Topics  . Smoking status: Never Smoker   . Smokeless tobacco: Never Used  . Alcohol Use: No   OB History    No data available      Review of Systems  Constitutional: Positive for fever.  Respiratory: Negative for shortness of breath.   Cardiovascular: Negative for chest pain.  Gastrointestinal: Positive for abdominal pain and diarrhea. Negative for nausea, vomiting and constipation.  Genitourinary: Negative for dysuria and frequency.  Neurological: Positive for headaches.  All other systems reviewed and are negative.    Allergies  Review of patient's allergies indicates no known allergies.  Home Medications   Prior to Admission medications   Medication Sig Start Date End Date Taking? Authorizing Provider  Ampicillin-Sulbactam 3 g in sodium chloride 0.9 % 100 mL Inject 3 g into the vein every 6 (six) hours. 06/07/14   Melanie Levora Dredge, MD  feeding supplement, RESOURCE BREEZE, (RESOURCE BREEZE) LIQD Take 1 Container by mouth 3 (three) times daily between meals. 06/05/14   Melanie Levora Dredge, MD  furosemide (LASIX) 40 MG tablet Take 1 tablet (40 mg total) by mouth daily. 06/05/14   Melanie  Levora DredgeM Ghimire, MD  lactulose (CHRONULAC) 10 GM/15ML solution Take 30 mLs (20 g total) by mouth 2 (two) times daily. 06/07/14   Melanie Levora DredgeM Ghimire, MD  metroNIDAZOLE (FLAGYL) 500 MG tablet Take 1 tablet (500 mg total) by mouth every 12 (twelve) hours. 7 days from 5/10 06/07/14   Melanie Levora DredgeM Ghimire, MD  predniSONE (DELTASONE) 5 MG tablet Take 5 mg by mouth daily.    Historical Provider, MD  spironolactone (ALDACTONE) 25 MG tablet Take 100 mg by mouth daily.     Historical Provider, MD   ursodiol (ACTIGALL) 300 MG capsule Take 600 mg by mouth daily.     Historical Provider, MD   BP 110/64 mmHg  Pulse 110  Temp(Src) 99.8 F (37.7 C) (Oral)  Resp 23  Ht 4\' 11"  (1.499 Melanie)  Wt 87 lb (39.463 kg)  BMI 17.56 kg/m2  SpO2 99%  LMP  (LMP Unknown)   Physical Exam  Constitutional: She is oriented to person, place, and time. She appears well-developed and well-nourished. No distress.  HENT:  Head: Normocephalic and atraumatic.  Mouth/Throat: Oropharynx is clear and moist. No oropharyngeal exudate.  Eyes: Conjunctivae and EOM are normal. Pupils are equal, round, and reactive to light.  Neck: Normal range of motion. Neck supple. No JVD present. No tracheal deviation present.  Cardiovascular: Normal rate, regular rhythm and normal heart sounds.   Pulmonary/Chest: Effort normal and breath sounds normal. No respiratory distress. She has no wheezes. She has no rales.  Abdominal: Soft. Bowel sounds are normal. She exhibits no distension and no mass. There is tenderness. There is no rebound and no guarding.  Musculoskeletal: Normal range of motion. She exhibits no edema or tenderness.  Neurological: She is alert and oriented to person, place, and time. She has normal reflexes.  Reflex Scores:      Patellar reflexes are 2+ on the right side and 2+ on the left side. Skin: Skin is warm and dry. She is not diaphoretic.  Psychiatric: She has a normal mood and affect. Her behavior is normal.  Nursing note and vitals reviewed.   ED Course  Procedures (including critical care time) DIAGNOSTIC STUDIES: Oxygen Saturation is 98% on RA, normal by my interpretation.    COORDINATION OF CARE: 11:17 PM-Discussed treatment plan with pt at bedside and pt agreed to plan.     Labs Review Labs Reviewed  CBC WITH DIFFERENTIAL/PLATELET - Abnormal; Notable for the following:    RBC 2.96 (*)    Hemoglobin 9.7 (*)    HCT 29.2 (*)    RDW 18.9 (*)    Platelets 125 (*)    Lymphocytes Relative 4 (*)     Lymphs Abs 0.3 (*)    Monocytes Relative 21 (*)    Monocytes Absolute 1.5 (*)    All other components within normal limits  PROTIME-INR - Abnormal; Notable for the following:    Prothrombin Time 17.2 (*)    All other components within normal limits  HEPATIC FUNCTION PANEL - Abnormal; Notable for the following:    Total Protein 5.4 (*)    Albumin 2.4 (*)    AST 138 (*)    ALT 137 (*)    Alkaline Phosphatase 533 (*)    Total Bilirubin 5.3 (*)    Bilirubin, Direct 3.0 (*)    Indirect Bilirubin 2.3 (*)    All other components within normal limits  LIPASE, BLOOD - Abnormal; Notable for the following:    Lipase 52 (*)    All other  components within normal limits  I-STAT CHEM 8, ED - Abnormal; Notable for the following:    Potassium 3.4 (*)    Glucose, Bld 105 (*)    Calcium, Ion 1.06 (*)    Hemoglobin 10.5 (*)    HCT 31.0 (*)    All other components within normal limits  URINALYSIS, ROUTINE W REFLEX MICROSCOPIC (NOT AT [facility])  AMMONIA  I-STAT BETA HCG BLOOD, ED ([facility], [facility], [facility] ONLY)  I-STAT CG4 LACTIC ACID, ED    Imaging Review Dg Abd Acute W/chest  07/30/2014   CLINICAL DATA:  Initial evaluation for 1 week history of abdominal pain.  EXAM: DG ABDOMEN ACUTE W/ 1V CHEST  COMPARISON:  Prior study from 06/04/2014  FINDINGS: The cardiac and mediastinal silhouettes are stable in size and contour, and remain within normal limits.  The lungs are normally inflated. No airspace consolidation, pleural effusion, or pulmonary edema is identified. There is no pneumothorax.  Bowel gas pattern within normal limits without evidence of obstruction or ileus. No abnormal bowel wall thickening. Moderate amount of retained stool within the colon. No free air. No soft tissue mass or abnormal calcification.  No acute osseus abnormality.  IMPRESSION: 1. Nonobstructive bowel gas pattern with no radiographic evidence for acute intra-abdominal process. 2. No active cardiopulmonary disease.   Electronically Signed    By: Rise Mu Melanie.D.   On: 07/30/2014 00:32     EKG Interpretation None      MDM   Final diagnoses:  None   Case d/w Dr. Ewing Schlein who will see patient in the am   I personally performed the services described in this documentation, which was scribed in my presence. The recorded information has been reviewed and is accurate.       Cy Blamer, MD 07/30/14 [phone]

## 2014-07-30 ENCOUNTER — Inpatient Hospital Stay (HOSPITAL_COMMUNITY): Payer: BC Managed Care – PPO

## 2014-07-30 ENCOUNTER — Encounter (HOSPITAL_COMMUNITY): Payer: Self-pay | Admitting: Internal Medicine

## 2014-07-30 DIAGNOSIS — E876 Hypokalemia: Secondary | ICD-10-CM | POA: Diagnosis present

## 2014-07-30 DIAGNOSIS — B349 Viral infection, unspecified: Secondary | ICD-10-CM | POA: Diagnosis present

## 2014-07-30 DIAGNOSIS — E43 Unspecified severe protein-calorie malnutrition: Secondary | ICD-10-CM

## 2014-07-30 DIAGNOSIS — R188 Other ascites: Secondary | ICD-10-CM | POA: Diagnosis present

## 2014-07-30 DIAGNOSIS — K219 Gastro-esophageal reflux disease without esophagitis: Secondary | ICD-10-CM | POA: Diagnosis present

## 2014-07-30 DIAGNOSIS — K7460 Unspecified cirrhosis of liver: Secondary | ICD-10-CM | POA: Diagnosis present

## 2014-07-30 DIAGNOSIS — K830 Cholangitis: Secondary | ICD-10-CM | POA: Diagnosis present

## 2014-07-30 DIAGNOSIS — R1033 Periumbilical pain: Secondary | ICD-10-CM

## 2014-07-30 DIAGNOSIS — Z7952 Long term (current) use of systemic steroids: Secondary | ICD-10-CM | POA: Diagnosis not present

## 2014-07-30 DIAGNOSIS — K754 Autoimmune hepatitis: Secondary | ICD-10-CM | POA: Diagnosis present

## 2014-07-30 DIAGNOSIS — E860 Dehydration: Secondary | ICD-10-CM | POA: Diagnosis present

## 2014-07-30 DIAGNOSIS — Z7682 Awaiting organ transplant status: Secondary | ICD-10-CM | POA: Diagnosis not present

## 2014-07-30 DIAGNOSIS — Z79899 Other long term (current) drug therapy: Secondary | ICD-10-CM | POA: Diagnosis not present

## 2014-07-30 DIAGNOSIS — K8301 Primary sclerosing cholangitis: Secondary | ICD-10-CM | POA: Diagnosis present

## 2014-07-30 DIAGNOSIS — D649 Anemia, unspecified: Secondary | ICD-10-CM | POA: Diagnosis present

## 2014-07-30 LAB — CBC
HCT: 28.1 % — ABNORMAL LOW (ref 36.0–46.0)
Hemoglobin: 9.3 g/dL — ABNORMAL LOW (ref 12.0–15.0)
MCH: 33 pg (ref 26.0–34.0)
MCHC: 33.1 g/dL (ref 30.0–36.0)
MCV: 99.6 fL (ref 78.0–100.0)
Platelets: 106 10*3/uL — ABNORMAL LOW (ref 150–400)
RBC: 2.82 MIL/uL — AB (ref 3.87–5.11)
RDW: 19.1 % — AB (ref 11.5–15.5)
WBC: 6 10*3/uL (ref 4.0–10.5)

## 2014-07-30 LAB — AMMONIA: AMMONIA: 155 umol/L — AB (ref 9–35)

## 2014-07-30 LAB — COMPREHENSIVE METABOLIC PANEL
ALT: 129 U/L — ABNORMAL HIGH (ref 14–54)
ANION GAP: 8 (ref 5–15)
AST: 113 U/L — AB (ref 15–41)
Albumin: 2.3 g/dL — ABNORMAL LOW (ref 3.5–5.0)
Alkaline Phosphatase: 507 U/L — ABNORMAL HIGH (ref 38–126)
BILIRUBIN TOTAL: 5.2 mg/dL — AB (ref 0.3–1.2)
BUN: 11 mg/dL (ref 6–20)
CO2: 22 mmol/L (ref 22–32)
CREATININE: 0.74 mg/dL (ref 0.44–1.00)
Calcium: 7.8 mg/dL — ABNORMAL LOW (ref 8.9–10.3)
Chloride: 103 mmol/L (ref 101–111)
GFR calc Af Amer: >60 mL/min (ref >60)
GLUCOSE: 172 mg/dL — AB (ref 65–99)
Potassium: 3.4 mmol/L — ABNORMAL LOW (ref 3.5–5.1)
Sodium: 133 mmol/L — ABNORMAL LOW (ref 135–145)
Total Protein: 5.4 g/dL — ABNORMAL LOW (ref 6.5–8.1)

## 2014-07-30 LAB — URINALYSIS, ROUTINE W REFLEX MICROSCOPIC
Glucose, UA: NEGATIVE mg/dL
HGB URINE DIPSTICK: NEGATIVE
Ketones, ur: 15 mg/dL — AB
NITRITE: POSITIVE — AB
Protein, ur: NEGATIVE mg/dL
Specific Gravity, Urine: 1.03 (ref 1.005–1.030)
Urobilinogen, UA: 2 mg/dL — ABNORMAL HIGH (ref 0.0–1.0)
pH: 6 (ref 5.0–8.0)

## 2014-07-30 LAB — LACTATE DEHYDROGENASE, PLEURAL OR PERITONEAL FLUID: LD FL: 13 U/L (ref 3–23)

## 2014-07-30 LAB — LACTIC ACID, PLASMA
LACTIC ACID, VENOUS: 2.2 mmol/L — AB (ref 0.5–2.0)
Lactic Acid, Venous: 1.8 mmol/L (ref 0.5–2.0)

## 2014-07-30 LAB — ALBUMIN, FLUID (OTHER)

## 2014-07-30 LAB — BODY FLUID CELL COUNT WITH DIFFERENTIAL
Lymphs, Fluid: 7 %
Monocyte-Macrophage-Serous Fluid: 84 % (ref 50–90)
Neutrophil Count, Fluid: 9 % (ref 0–25)
Total Nucleated Cell Count, Fluid: 62 cu mm (ref 0–1000)

## 2014-07-30 LAB — HEPATIC FUNCTION PANEL
ALT: 137 U/L — AB (ref 14–54)
AST: 138 U/L — ABNORMAL HIGH (ref 15–41)
Albumin: 2.4 g/dL — ABNORMAL LOW (ref 3.5–5.0)
Alkaline Phosphatase: 533 U/L — ABNORMAL HIGH (ref 38–126)
BILIRUBIN TOTAL: 5.3 mg/dL — AB (ref 0.3–1.2)
Bilirubin, Direct: 3 mg/dL — ABNORMAL HIGH (ref 0.1–0.5)
Indirect Bilirubin: 2.3 mg/dL — ABNORMAL HIGH (ref 0.3–0.9)
Total Protein: 5.4 g/dL — ABNORMAL LOW (ref 6.5–8.1)

## 2014-07-30 LAB — URINE MICROSCOPIC-ADD ON

## 2014-07-30 LAB — I-STAT CG4 LACTIC ACID, ED: LACTIC ACID, VENOUS: 1.13 mmol/L (ref 0.5–2.0)

## 2014-07-30 LAB — BILIRUBIN, DIRECT: Bilirubin, Direct: 2.8 mg/dL — ABNORMAL HIGH (ref 0.1–0.5)

## 2014-07-30 LAB — GLUCOSE, PERITONEAL FLUID: Glucose, Peritoneal Fluid: 136 mg/dL

## 2014-07-30 LAB — PROCALCITONIN: PROCALCITONIN: 0.18 ng/mL

## 2014-07-30 LAB — PROTEIN, BODY FLUID: Total protein, fluid: <3 g/dL

## 2014-07-30 LAB — APTT: aPTT: 34 seconds (ref 24–37)

## 2014-07-30 LAB — LIPASE, BLOOD: Lipase: 52 U/L — ABNORMAL HIGH (ref 22–51)

## 2014-07-30 MED ORDER — URSODIOL 300 MG PO CAPS
600.0000 mg | ORAL_CAPSULE | Freq: Every day | ORAL | Status: DC
  Administered 2014-07-30: 300 mg via ORAL
  Administered 2014-07-30 – 2014-07-31 (×2): 600 mg via ORAL
  Filled 2014-07-30 (×3): qty 2

## 2014-07-30 MED ORDER — CEFTRIAXONE SODIUM IN DEXTROSE 40 MG/ML IV SOLN
2.0000 g | INTRAVENOUS | Status: DC
  Administered 2014-07-31: 2 g via INTRAVENOUS
  Filled 2014-07-30 (×2): qty 50

## 2014-07-30 MED ORDER — CEFTRIAXONE SODIUM IN DEXTROSE 20 MG/ML IV SOLN
1.0000 g | INTRAVENOUS | Status: DC
  Administered 2014-07-30: 1 g via INTRAVENOUS
  Filled 2014-07-30: qty 50

## 2014-07-30 MED ORDER — SODIUM CHLORIDE 0.9 % IV SOLN
INTRAVENOUS | Status: DC
  Administered 2014-07-30: 100 mL via INTRAVENOUS
  Administered 2014-07-30 – 2014-07-31 (×2): via INTRAVENOUS

## 2014-07-30 MED ORDER — BOOST / RESOURCE BREEZE PO LIQD
1.0000 | Freq: Three times a day (TID) | ORAL | Status: DC
  Administered 2014-07-30 – 2014-07-31 (×3): 1 via ORAL

## 2014-07-30 MED ORDER — PROMETHAZINE HCL 25 MG/ML IJ SOLN
25.0000 mg | Freq: Four times a day (QID) | INTRAMUSCULAR | Status: DC | PRN
  Administered 2014-07-30 – 2014-07-31 (×2): 25 mg via INTRAVENOUS
  Filled 2014-07-30 (×2): qty 1

## 2014-07-30 MED ORDER — HYDROCORTISONE NA SUCCINATE PF 100 MG IJ SOLR
50.0000 mg | Freq: Once | INTRAMUSCULAR | Status: AC
  Administered 2014-07-30: 50 mg via INTRAVENOUS
  Filled 2014-07-30: qty 2

## 2014-07-30 MED ORDER — SODIUM CHLORIDE 0.9 % IJ SOLN
3.0000 mL | Freq: Two times a day (BID) | INTRAMUSCULAR | Status: DC
  Administered 2014-07-31: 3 mL via INTRAVENOUS

## 2014-07-30 MED ORDER — POTASSIUM CHLORIDE 10 MEQ/100ML IV SOLN
10.0000 meq | INTRAVENOUS | Status: AC
  Administered 2014-07-30 (×2): 10 meq via INTRAVENOUS
  Filled 2014-07-30: qty 100

## 2014-07-30 MED ORDER — KETOROLAC TROMETHAMINE 30 MG/ML IJ SOLN
30.0000 mg | Freq: Once | INTRAMUSCULAR | Status: AC
  Administered 2014-07-30: 30 mg via INTRAVENOUS
  Filled 2014-07-30: qty 1

## 2014-07-30 MED ORDER — LACTULOSE 10 GM/15ML PO SOLN
20.0000 g | Freq: Two times a day (BID) | ORAL | Status: DC
  Filled 2014-07-30 (×3): qty 30

## 2014-07-30 MED ORDER — MORPHINE SULFATE 2 MG/ML IJ SOLN
2.0000 mg | INTRAMUSCULAR | Status: DC | PRN
  Administered 2014-07-30 – 2014-07-31 (×6): 2 mg via INTRAVENOUS
  Filled 2014-07-30 (×6): qty 1

## 2014-07-30 MED ORDER — LIDOCAINE HCL (PF) 1 % IJ SOLN
INTRAMUSCULAR | Status: AC
  Filled 2014-07-30: qty 10

## 2014-07-30 MED ORDER — ONDANSETRON HCL 4 MG/2ML IJ SOLN
4.0000 mg | Freq: Once | INTRAMUSCULAR | Status: AC
  Administered 2014-07-30: 4 mg via INTRAVENOUS

## 2014-07-30 MED ORDER — FENTANYL CITRATE (PF) 100 MCG/2ML IJ SOLN
100.0000 ug | Freq: Once | INTRAMUSCULAR | Status: AC
  Administered 2014-07-30: 100 ug via INTRAVENOUS
  Filled 2014-07-30: qty 2

## 2014-07-30 MED ORDER — LACTULOSE 10 GM/15ML PO SOLN
20.0000 g | Freq: Three times a day (TID) | ORAL | Status: DC
  Administered 2014-07-30 – 2014-07-31 (×4): 20 g via ORAL
  Filled 2014-07-30 (×6): qty 30

## 2014-07-30 MED ORDER — PREDNISONE 5 MG PO TABS
5.0000 mg | ORAL_TABLET | Freq: Every day | ORAL | Status: DC
  Filled 2014-07-30: qty 1

## 2014-07-30 MED ORDER — ONDANSETRON HCL 4 MG/2ML IJ SOLN
4.0000 mg | Freq: Three times a day (TID) | INTRAMUSCULAR | Status: DC | PRN
  Administered 2014-07-30: 4 mg via INTRAVENOUS
  Filled 2014-07-30: qty 2

## 2014-07-30 MED ORDER — GI COCKTAIL ~~LOC~~
30.0000 mL | Freq: Once | ORAL | Status: AC
  Administered 2014-07-30: 30 mL via ORAL
  Filled 2014-07-30: qty 30

## 2014-07-30 MED ORDER — HEPARIN SODIUM (PORCINE) 5000 UNIT/ML IJ SOLN
5000.0000 [IU] | Freq: Three times a day (TID) | INTRAMUSCULAR | Status: DC
  Filled 2014-07-30 (×6): qty 1

## 2014-07-30 MED ORDER — SODIUM CHLORIDE 0.9 % IV BOLUS (SEPSIS)
1000.0000 mL | Freq: Once | INTRAVENOUS | Status: AC
  Administered 2014-07-30: 1000 mL via INTRAVENOUS

## 2014-07-30 MED ORDER — DEXTROSE 5 % IV SOLN: 1.0000 g | Freq: Once | INTRAVENOUS | Status: DC

## 2014-07-30 MED ORDER — POTASSIUM CHLORIDE 10 MEQ/100ML IV SOLN
10.0000 meq | INTRAVENOUS | Status: AC
  Administered 2014-07-30 (×3): 10 meq via INTRAVENOUS
  Filled 2014-07-30: qty 100

## 2014-07-30 NOTE — Progress Notes (Signed)
Patient actively vomiting after IV zofran administered.  MD notified.  Will continue to monitor.

## 2014-07-30 NOTE — Progress Notes (Signed)
New Admission Note:   Arrival Method: wheelchair Mental Orientation: alert and oriented x4 Telemetry: box6 Assessment:flow sheet Skin:intact ZO:XWRUV:left a/c Pain:headache Tubes:none Safety Measures: Safety Fall Prevention Plan      Discussed  Admission:to be completed6 [place] Orientation: Patient has been orientated to the room, unit and staff.  Family:none at bedside  Orders have been reviewed and implemented. Will continue to monitor the patient. Call light has been placed within reach  Clement Sayresathie Hollyn Stucky, RN Phone number: [phone]

## 2014-07-30 NOTE — Consult Note (Signed)
Reason for Consult: Slight increase in transaminases Referring Physician: Hospital team  Melanie Conley is an 24 y.o. female.  HPI: Patient has been followed for years at [facility] and has never been to our office and only had an endoscopy by my partner Dr. Penelope Coop last month due to self-limited hematemesis and she is currently feeling better and her belly pain comes and goes and her ascites is not that bad and she has no signs of bleeding and she actually came to the emergency room more for her headache which is better and she wants to eat but is awaiting paracentesis and her hospital computer chart was reviewed and our office chart was reviewed as well  Past Medical History  Diagnosis Date  . Liver disease   . Hepatitis   . Anemia   . Esophageal varices   . Autoimmune hepatitis   . GIB (gastrointestinal bleeding)   . Cirrhosis   . Primary sclerosing cholangitis     Past Surgical History  Procedure Laterality Date  . Gastric banding port revision    . Tonsillectomy    . Esophagogastroduodenoscopy  07/22/2011    Procedure: ESOPHAGOGASTRODUODENOSCOPY (EGD);  Surgeon: Wonda Horner, MD;  Location: [facility] ENDOSCOPY;  Service: Endoscopy;  Laterality: N/A;    Family History  Problem Relation Age of Onset  . Adopted: Yes    Social History:  reports that she has never smoked. She has never used smokeless tobacco. She reports that she does not drink alcohol or use illicit drugs.  Allergies: No Known Allergies  Medications: I have reviewed the patient's current medications.  Results for orders placed or performed during the hospital encounter of 07/29/14 (from the past 48 hour(s))  CBC with Differential/Platelet     Status: Abnormal   Collection Time: 07/29/14 11:18 PM  Result Value Ref Range   WBC 7.3 4.0 - 10.5 K/uL   RBC 2.96 (L) 3.87 - 5.11 MIL/uL   Hemoglobin 9.7 (L) 12.0 - 15.0 g/dL   HCT 29.2 (L) 36.0 - 46.0 %   MCV 98.6 78.0 - 100.0 fL   MCH 32.8 26.0 - 34.0 pg   MCHC 33.2  30.0 - 36.0 g/dL   RDW 18.9 (H) 11.5 - 15.5 %   Platelets 125 (L) 150 - 400 K/uL   Neutrophils Relative % 75 43 - 77 %   Neutro Abs 5.5 1.7 - 7.7 K/uL   Lymphocytes Relative 4 (L) 12 - 46 %   Lymphs Abs 0.3 (L) 0.7 - 4.0 K/uL   Monocytes Relative 21 (H) 3 - 12 %   Monocytes Absolute 1.5 (H) 0.1 - 1.0 K/uL   Eosinophils Relative 0 0 - 5 %   Eosinophils Absolute 0.0 0.0 - 0.7 K/uL   Basophils Relative 0 0 - 1 %   Basophils Absolute 0.0 0.0 - 0.1 K/uL  Protime-INR     Status: Abnormal   Collection Time: 07/29/14 11:18 PM  Result Value Ref Range   Prothrombin Time 17.2 (H) 11.6 - 15.2 seconds   INR 1.39 0.00 - 1.49  Hepatic function panel     Status: Abnormal   Collection Time: 07/29/14 11:18 PM  Result Value Ref Range   Total Protein 5.4 (L) 6.5 - 8.1 g/dL   Albumin 2.4 (L) 3.5 - 5.0 g/dL   AST 138 (H) 15 - 41 U/L   ALT 137 (H) 14 - 54 U/L   Alkaline Phosphatase 533 (H) 38 - 126 U/L   Total Bilirubin 5.3 (H)  0.3 - 1.2 mg/dL   Bilirubin, Direct 3.0 (H) 0.1 - 0.5 mg/dL   Indirect Bilirubin 2.3 (H) 0.3 - 0.9 mg/dL  Lipase, blood     Status: Abnormal   Collection Time: 07/29/14 11:18 PM  Result Value Ref Range   Lipase 52 (H) 22 - 51 U/L  Ammonia     Status: Abnormal   Collection Time: 07/29/14 11:18 PM  Result Value Ref Range   Ammonia 155 (H) 9 - 35 umol/L  I-Stat Beta hCG blood, ED ([facility], [facility], [facility] only)     Status: None   Collection Time: 07/29/14 11:27 PM  Result Value Ref Range   I-stat hCG, quantitative <5.0 <5 mIU/mL   Comment 3            Comment:   GEST. AGE      CONC.  (mIU/mL)   <=1 WEEK        5 - 50     2 WEEKS       50 - 500     3 WEEKS       100 - 10,000     4 WEEKS     1,000 - 30,000        FEMALE AND NON-PREGNANT FEMALE:     LESS THAN 5 mIU/mL   I-Stat Chem 8, ED     Status: Abnormal   Collection Time: 07/29/14 11:29 PM  Result Value Ref Range   Sodium 136 135 - 145 mmol/L   Potassium 3.4 (L) 3.5 - 5.1 mmol/L   Chloride 104 101 - 111 mmol/L   BUN 9 6 - 20  mg/dL   Creatinine, Ser 0.70 0.44 - 1.00 mg/dL   Glucose, Bld 105 (H) 65 - 99 mg/dL   Calcium, Ion 1.06 (L) 1.12 - 1.23 mmol/L   TCO2 19 0 - 100 mmol/L   Hemoglobin 10.5 (L) 12.0 - 15.0 g/dL   HCT 31.0 (L) 36.0 - 46.0 %  I-Stat CG4 Lactic Acid, ED     Status: None   Collection Time: 07/29/14 11:30 PM  Result Value Ref Range   Lactic Acid, Venous 0.97 0.5 - 2.0 mmol/L  I-Stat CG4 Lactic Acid, ED     Status: None   Collection Time: 07/30/14  1:57 AM  Result Value Ref Range   Lactic Acid, Venous 1.13 0.5 - 2.0 mmol/L  Urinalysis, Routine w reflex microscopic (not at [facility]     Status: Abnormal   Collection Time: 07/30/14  2:00 AM  Result Value Ref Range   Color, Urine ORANGE (A) YELLOW    Comment: BIOCHEMICALS MAY BE AFFECTED BY COLOR   APPearance CLEAR CLEAR   Specific Gravity, Urine 1.030 1.005 - 1.030   pH 6.0 5.0 - 8.0   Glucose, UA NEGATIVE NEGATIVE mg/dL   Hgb urine dipstick NEGATIVE NEGATIVE   Bilirubin Urine LARGE (A) NEGATIVE   Ketones, ur 15 (A) NEGATIVE mg/dL   Protein, ur NEGATIVE NEGATIVE mg/dL   Urobilinogen, UA 2.0 (H) 0.0 - 1.0 mg/dL   Nitrite POSITIVE (A) NEGATIVE   Leukocytes, UA SMALL (A) NEGATIVE  Urine microscopic-add on     Status: Abnormal   Collection Time: 07/30/14  2:00 AM  Result Value Ref Range   Squamous Epithelial / LPF MANY (A) RARE   WBC, UA 3-6 <3 WBC/hpf   RBC / HPF 0-2 <3 RBC/hpf   Bacteria, UA RARE RARE   Urine-Other MUCOUS PRESENT     Comment: RARE YEAST  Bilirubin, direct  Status: Abnormal   Collection Time: 07/30/14  3:50 AM  Result Value Ref Range   Bilirubin, Direct 2.8 (H) 0.1 - 0.5 mg/dL  Comprehensive metabolic panel     Status: Abnormal   Collection Time: 07/30/14  3:50 AM  Result Value Ref Range   Sodium 133 (L) 135 - 145 mmol/L   Potassium 3.4 (L) 3.5 - 5.1 mmol/L   Chloride 103 101 - 111 mmol/L   CO2 22 22 - 32 mmol/L   Glucose, Bld 172 (H) 65 - 99 mg/dL   BUN 11 6 - 20 mg/dL   Creatinine, Ser 0.74 0.44 - 1.00 mg/dL    Calcium 7.8 (L) 8.9 - 10.3 mg/dL   Total Protein 5.4 (L) 6.5 - 8.1 g/dL   Albumin 2.3 (L) 3.5 - 5.0 g/dL   AST 113 (H) 15 - 41 U/L   ALT 129 (H) 14 - 54 U/L   Alkaline Phosphatase 507 (H) 38 - 126 U/L   Total Bilirubin 5.2 (H) 0.3 - 1.2 mg/dL   GFR calc non Af Amer >60 >60 mL/min   GFR calc Af Amer >60 >60 mL/min    Comment: (NOTE) The eGFR has been calculated using the CKD EPI equation. This calculation has not been validated in all clinical situations. eGFR's persistently <60 mL/min signify possible Chronic Kidney Disease.    Anion gap 8 5 - 15  CBC     Status: Abnormal   Collection Time: 07/30/14  3:50 AM  Result Value Ref Range   WBC 6.0 4.0 - 10.5 K/uL   RBC 2.82 (L) 3.87 - 5.11 MIL/uL   Hemoglobin 9.3 (L) 12.0 - 15.0 g/dL   HCT 28.1 (L) 36.0 - 46.0 %   MCV 99.6 78.0 - 100.0 fL   MCH 33.0 26.0 - 34.0 pg   MCHC 33.1 30.0 - 36.0 g/dL   RDW 19.1 (H) 11.5 - 15.5 %   Platelets 106 (L) 150 - 400 K/uL    Comment: SPECIMEN CHECKED FOR CLOTS REPEATED TO VERIFY PLATELET COUNT CONFIRMED BY SMEAR   Lactic acid, plasma     Status: Abnormal   Collection Time: 07/30/14  3:50 AM  Result Value Ref Range   Lactic Acid, Venous 2.2 (HH) 0.5 - 2.0 mmol/L    Comment: CRITICAL RESULT CALLED TO, READ BACK BY AND VERIFIED WITH: [employer] RN 07/30/2014 0507 JORDANS REPEATED TO VERIFY   Procalcitonin     Status: None   Collection Time: 07/30/14  3:50 AM  Result Value Ref Range   Procalcitonin 0.18 ng/mL    Comment:        Interpretation: PCT (Procalcitonin) <= 0.5 ng/mL: Systemic infection (sepsis) is not likely. Local bacterial infection is possible. (NOTE)         ICU PCT Algorithm               Non ICU PCT Algorithm    ----------------------------     ------------------------------         PCT < 0.25 ng/mL                 PCT < 0.1 ng/mL     Stopping of antibiotics            Stopping of antibiotics       strongly encouraged.               strongly encouraged.     ----------------------------     ------------------------------       PCT level decrease by  PCT < 0.25 ng/mL       >= 80% from peak PCT       OR PCT 0.25 - 0.5 ng/mL          Stopping of antibiotics                                             encouraged.     Stopping of antibiotics           encouraged.    ----------------------------     ------------------------------       PCT level decrease by              PCT >= 0.25 ng/mL       < 80% from peak PCT        AND PCT >= 0.5 ng/mL            Continuin g antibiotics                                              encouraged.       Continuing antibiotics            encouraged.    ----------------------------     ------------------------------     PCT level increase compared          PCT > 0.5 ng/mL         with peak PCT AND          PCT >= 0.5 ng/mL             Escalation of antibiotics                                          strongly encouraged.      Escalation of antibiotics        strongly encouraged.   APTT     Status: None   Collection Time: 07/30/14  3:50 AM  Result Value Ref Range   aPTT 34 24 - 37 seconds  Lactic acid, plasma     Status: None   Collection Time: 07/30/14  5:27 AM  Result Value Ref Range   Lactic Acid, Venous 1.8 0.5 - 2.0 mmol/L    Dg Abd Acute W/chest  07/30/2014   CLINICAL DATA:  Initial evaluation for 1 week history of abdominal pain.  EXAM: DG ABDOMEN ACUTE W/ 1V CHEST  COMPARISON:  Prior study from 06/04/2014  FINDINGS: The cardiac and mediastinal silhouettes are stable in size and contour, and remain within normal limits.  The lungs are normally inflated. No airspace consolidation, pleural effusion, or pulmonary edema is identified. There is no pneumothorax.  Bowel gas pattern within normal limits without evidence of obstruction or ileus. No abnormal bowel wall thickening. Moderate amount of retained stool within the colon. No free air. No soft tissue mass or abnormal calcification.  No acute  osseus abnormality.  IMPRESSION: 1. Nonobstructive bowel gas pattern with no radiographic evidence for acute intra-abdominal process. 2. No active cardiopulmonary disease.   Electronically Signed   By: Jeannine Boga M.D.   On: 07/30/2014 00:32    ROS negative except above Blood pressure 92/54, pulse 63,  temperature 97.6 F (36.4 C), temperature source Oral, resp. rate 16, height '4\' 11"'  (1.499 m), weight 44.679 kg (98 lb 8 oz), SpO2 100 %. Physical Exam no acute distress very nice person unfortunate medical problem vital signs stable afebrile exam pertinent for some ascites soft nontender bilirubin is actually decreased and transaminases are only a little higher than her baseline without any other obvious worrisome parameters  Assessment/Plan: Improved symptomatically Plan: Agree with paracentesis to rule out SBP but then can probably go home soon and please call me if any quick question or problem otherwise if significant GI issue she should be followed by the undersigned team and she should follow-up in [place] sooner than her scheduled appointment the end of July if you have any significant concerns  Sennie Borden E 07/30/2014, 12:03 PM

## 2014-07-30 NOTE — ED Notes (Signed)
Patient back to xray.

## 2014-07-30 NOTE — Progress Notes (Signed)
Patient Demographics:    Melanie Conley, is a 24 y.o. female, DOB - [birth year], ZOX:[id number]  Admit date - 07/29/2014   Admitting Physician Lorretta HarpXilin Niu, MD  Outpatient Primary MD for the patient is [name],[name], MD  LOS - 0   Chief Complaint  Patient presents with  . Abdominal Pain  . Headache        Subjective:    Melanie Conley today has, No headache, No chest pain, No abdominal pain - No Nausea, No new weakness tingling or numbness, No Cough - SOB.     Assessment  & Plan :     1. Abdominal pain along with nausea vomiting and headache. Could have been a viral infection however with her history of cirrhosis and past history of SBP imperative to rule out an ongoing SBP. Blood cultures ordered, ultrasound-guided paracentesis ordered, on IV Rocephin, already she is completely symptom free and feels better. GI to follow. We will follow paracentesis fluid results once obtained.   2. History of autoimmune hepatitis/PSC. [facility], continue stress dose steroids,Ursodiol, lactulose, GI to see. Ultrasound-guided paracentesis as above. Liver enzymes improving. Diuretics on hold due to clinical dehydration.   3. Hypokalemia. Replaced monitor.   4. Headache. Could be from viral syndrome versus due to nausea vomiting. All resolved.    Code Status : Full  Family Communication  : None present  Disposition Plan  : Home in 1-2 days  Consults  :  IR, GI  Procedures  :  Ultrasound guided paracentesis requested on 07/30/2014  DVT Prophylaxis  :   Heparin   Lab Results  Component Value Date   PLT 106* 07/30/2014    Inpatient Medications  Scheduled Meds: . [START ON 07/31/2014] cefTRIAXone (ROCEPHIN)  IV  2 g Intravenous Q24H  . feeding supplement (RESOURCE BREEZE)  1 Container Oral  TID BM  . heparin  5,000 Units Subcutaneous 3 times per day  . lactulose  20 g Oral BID  . sodium chloride  3 mL Intravenous Q12H  . ursodiol  600 mg Oral Daily   Continuous Infusions: . sodium chloride 50 mL/hr at 07/30/14 1006   PRN Meds:.morphine injection, ondansetron (ZOFRAN) IV  Antibiotics  :     Anti-infectives    Start     Dose/Rate Route Frequency Ordered Stop   07/31/14 0300  cefTRIAXone (ROCEPHIN) 2 g in dextrose 5 % 50 mL IVPB - Premix     2 g 100 mL/hr over 30 Minutes Intravenous Every 24 hours 07/30/14 0722     07/30/14 0300  cefTRIAXone (ROCEPHIN) 1 g in dextrose 5 % 50 mL IVPB - Premix  Status:  Discontinued     1 g 100 mL/hr over 30 Minutes Intravenous Every 24 hours 07/30/14 0254 07/30/14 0722   07/30/14 0115  cefTRIAXone (ROCEPHIN) 1 g in dextrose 5 % 50 mL IVPB  Status:  Discontinued     1 g 100 mL/hr over 30 Minutes Intravenous  Once 07/30/14 0107 07/30/14 0107        Objective:   Filed Vitals:   07/30/14 0158 07/30/14 0352 07/30/14 0748 07/30/14 1000  BP: 105/66 95/55 102/57 92/54  Pulse: 104 84 78 63  Temp:  98.7 F (37.1 C) 97.9 F (  36.6 C) 97.6 F (36.4 C)  TempSrc:  Oral Oral Oral  Resp: Height:   (1.499 m)    Weight:  44.679 kg (98 lb 8 oz)    SpO2: 99% 99% 100% 100%    Wt Readings from Last 3 Encounters:  07/30/14 44.679 kg (98 lb 8 oz)  06/04/14 54.795 kg (120 lb 12.8 oz)  05/27/14 47.991 kg (105 lb 12.8 oz)     Intake/Output Summary (Last 24 hours) at 07/30/14 1041 Last data filed at 07/30/14 0924  Gross per 24 hour  Intake   1705 ml  Output      0 ml  Net   1705 ml     Physical Exam  Awake Alert, Oriented X 3, No new F.N deficits, Normal affect [place].AT,PERRAL Supple Neck,No JVD, No cervical lymphadenopathy appriciated.  Symmetrical Chest wall movement, Good air movement bilaterally, CTAB RRR,No Gallops,Rubs or new Murmurs, No Parasternal Heave +ve B.Sounds, Abd Soft, No tenderness, No organomegaly  appriciated, No rebound - guarding or rigidity. No Cyanosis, Clubbing or edema, No new Rash or bruise      Data Review:   Micro Results No results found for this or any previous visit (from the past 240 hour(s)).  Radiology Reports Dg Abd Acute W/chest  07/30/2014   CLINICAL DATA:  Initial evaluation for 1 week history of abdominal pain.  EXAM: DG ABDOMEN ACUTE W/ 1V CHEST  COMPARISON:  Prior study from 06/04/2014  FINDINGS: The cardiac and mediastinal silhouettes are stable in size and contour, and remain within normal limits.  The lungs are normally inflated. No airspace consolidation, pleural effusion, or pulmonary edema is identified. There is no pneumothorax.  Bowel gas pattern within normal limits without evidence of obstruction or ileus. No abnormal bowel wall thickening. Moderate amount of retained stool within the colon. No free air. No soft tissue mass or abnormal calcification.  No acute osseus abnormality.  IMPRESSION: 1. Nonobstructive bowel gas pattern with no radiographic evidence for acute intra-abdominal process. 2. No active cardiopulmonary disease.   Electronically Signed   By: Rise Mu M.D.   On: 07/30/2014 00:32     CBC  Recent Labs Lab 07/29/14 2318 07/29/14 2329 07/30/14 0350  WBC 7.3  --  6.0  HGB 9.7* 10.5* 9.3*  HCT 29.2* 31.0* 28.1*  PLT 125*  --  106*  MCV 98.6  --  99.6  MCH 32.8  --  33.0  MCHC 33.2  --  33.1  RDW 18.9*  --  19.1*  LYMPHSABS 0.3*  --   --   MONOABS 1.5*  --   --   EOSABS 0.0  --   --   BASOSABS 0.0  --   --     Chemistries   Recent Labs Lab 07/29/14 2318 07/29/14 2329 07/30/14 0350  NA  --  136 133*  K  --  3.4* 3.4*  CL  --  104 103  CO2  --   --  22  GLUCOSE  --  105* 172*  BUN  --  9 11  CREATININE  --  0.70 0.74  CALCIUM  --   --  7.8*  AST 138*  --  113*  ALT 137*  --  129*  ALKPHOS 533*  --  507*  BILITOT 5.3*  --  5.2*    ------------------------------------------------------------------------------------------------------------------ estimated creatinine clearance is 74.6 mL/min (by C-G formula based on Cr of 0.74). ------------------------------------------------------------------------------------------------------------------ No results for input(s): HGBA1C in  the last 72 hours. ------------------------------------------------------------------------------------------------------------------ No results for input(s): CHOL, HDL, LDLCALC, TRIG, CHOLHDL, LDLDIRECT in the last 72 hours. ------------------------------------------------------------------------------------------------------------------ No results for input(s): TSH, T4TOTAL, T3FREE, THYROIDAB in the last 72 hours.  Invalid input(s): FREET3 ------------------------------------------------------------------------------------------------------------------ No results for input(s): VITAMINB12, FOLATE, FERRITIN, TIBC, IRON, RETICCTPCT in the last 72 hours.  Coagulation profile  Recent Labs Lab 07/29/14 2318  INR 1.39    No results for input(s): DDIMER in the last 72 hours.  Cardiac Enzymes No results for input(s): CKMB, TROPONINI, MYOGLOBIN in the last 168 hours.  Invalid input(s): CK ------------------------------------------------------------------------------------------------------------------ Invalid input(s): POCBNP   Time Spent in minutes   35   SINGH,PRASHANT K M.D on 07/30/2014 at 10:41 AM  Between 7am to 7pm - Pager - [phone]  After 7pm go to [email or web address] - password [facility]  [facility]   Office  [phone]

## 2014-07-30 NOTE — Progress Notes (Signed)
Utilization review completed.  

## 2014-07-30 NOTE — H&P (Signed)
[facility] History and Physical  Melanie SkyeZubrina D Chernick ZOX:[id number] DOB: 15-Jun-1990 DOA: 07/29/2014  Referring physician: ED physician PCP: [name],[name], MD  Specialists:   Chief Complaint: Nausea, vomiting, abdominal pain and HA.   HPI: Melanie Conley is a 24 y.o. 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   Patient states that she has abdominal pain, nausea and vomiting for almost a week. Her abdominal pain is located in the central abdomen, 10 out of 10 in severity, persistent, nonradiating. It is not aggravated or alleviated by any known factors. She states she has loose stool, which she attributes to taking lactulose. She also has a severe headache, but no neck stiffness or neck pain. No rashes, unilateral weakness, numbness or tingling sensations. She does not have cough, chest pain, shortness of breath, symptoms for UTI. She cannot tell what medications she is taking exactly.  Of note, patient was hospitalized from 5/6 to 5/10 due to sepsis. She had diagnostic paracentesis which showed WBC only 43, not consistent with SBP. She had negative MRCP for signigicant abnormalities-no evidence of cholangitis. [facility] liver [facility]. She states that she was put on the transplantation list. 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   In ED, patient was found to have worsening liver function from 06/08/14: ALP 289-533, AST 83-138, ALT 59-137, total bilirubin 8.4-5.3. Potassium is 3.4, lipase 52, INR 1.39, lactate 0.973 CT abdomen/pelvis did not show acute abnormalities. Patient is admitted to inpatient for further evaluation and treatment.  Where does patient live?   At home   Can patient participate in ADLs?   Little    Review of Systems:   General: no fevers, chills, no changes in body weight, has poor appetite, has  fatigue HEENT: no blurry vision, hearing changes or sore throat. Has HA. Pulm: no dyspnea, coughing, wheezing CV: no chest pain, palpitations Abd: has nausea, vomiting, abdominal pain, diarrhea, no constipation GU: no dysuria, burning on urination, increased urinary frequency, hematuria  Ext: no leg edema Neuro: no unilateral weakness, numbness, or tingling, no vision change or hearing loss Skin: no rash MSK: No muscle spasm, no deformity, no limitation of range of movement in spin Heme: No easy bruising.  Travel history: No recent long distant travel.  Allergy: No Known Allergies  Past Medical History  Diagnosis Date  . Liver disease   . Hepatitis   . Anemia   . Esophageal varices   . Autoimmune hepatitis   . GIB (gastrointestinal bleeding)   . Cirrhosis   . Primary sclerosing cholangitis     Past Surgical History  Procedure Laterality Date  . Gastric banding port revision    . Tonsillectomy    . Esophagogastroduodenoscopy  07/22/2011    Procedure: ESOPHAGOGASTRODUODENOSCOPY (EGD);  Surgeon: Graylin ShiverSalem F Ganem, MD;  Location[facility] END[facility]COPY;  Service: Endoscopy;  Laterality: N/A;    Social History:  reports that she has never smoked. She has never used smokeless tobacco. She reports that she does not drink alcohol or use illicit drugs.  Family History:  Family History  Problem Relation Age of Onset  . Adopted: Yes     Prior to Admission medications   Medication Sig Start Date End Date Taking? Authorizing Provider  Ampicillin-Sulbactam 3 g in sodium chloride 0.9 % 100 mL Inject 3 g into the vein every 6 (six) hours. 06/07/14   Shanker Levora DredgeM Ghimire,  MD  feeding supplement, RESOURCE BREEZE, (RESOURCE BREEZE) LIQD Take 1 Container by mouth 3 (three) times daily between meals. 06/05/14   Shanker Levora Dredge, MD  furosemide (LASIX) 40 MG tablet Take 1 tablet (40 mg total) by mouth daily. 06/05/14   Shanker Levora Dredge, MD  lactulose (CHRONULAC) 10 GM/15ML solution Take 30 mLs (20 g total) by  mouth 2 (two) times daily. 06/07/14   Shanker Levora Dredge, MD  metroNIDAZOLE (FLAGYL) 500 MG tablet Take 1 tablet (500 mg total) by mouth every 12 (twelve) hours. 7 days from 5/10 06/07/14   Shanker Levora Dredge, MD  predniSONE (DELTASONE) 5 MG tablet Take 5 mg by mouth daily.    Historical Provider, MD  spironolactone (ALDACTONE) 25 MG tablet Take 100 mg by mouth daily.     Historical Provider, MD  ursodiol (ACTIGALL) 300 MG capsule Take 600 mg by mouth daily.     Historical Provider, MD    Physical Exam: Filed Vitals:   07/30/14 0130 07/30/14 0145 07/30/14 0149 07/30/14 0158  BP: 108/70 105/66 105/66 105/66  Pulse: 96 95 88 104  Temp:   98.8 F (37.1 C)   TempSrc:   Oral   Resp: Height:      Weight:      SpO2: 99% 100% 98% 99%   General:  in acute distress due to pain HEENT:       Eyes: PERRL, EOMI, has scleral icterus.       ENT: No discharge from the ears and nose, no pharynx injection, no tonsillar enlargement.        Neck: No JVD, no bruit, no mass felt. Heme: No neck lymph node enlargement. Cardiac: S1/S2, RRR, No murmurs, No gallops or rubs. Pulm:  No rales, wheezing, rhonchi or rubs. Abd: Soft, distended, diffusely tender, no rebound pain, no organomegaly, BS present. Ext: No pitting leg edema bilaterally. 2+DP/PT pulse bilaterally. Musculoskeletal: No joint deformities, No joint redness or warmth, no limitation of ROM in spin. Skin: No rashes.  Neuro: Alert, oriented X3, cranial nerves II-XII grossly intact, muscle strength 5/5 in all extremities, sensation to light touch intact. No meningeal signs, negative Kernig's Brudzinski sign. Psych: Patient is not psychotic, no suicidal or hemocidal ideation.  Labs on Admission:  Basic Metabolic Panel:  Recent Labs Lab 07/29/14 2329  NA 136  K 3.4*  CL 104  GLUCOSE 105*  BUN 9  CREATININE 0.70   Liver Function Tests:  Recent Labs Lab 07/29/14 2318  AST 138*  ALT 137*  ALKPHOS 533*  BILITOT 5.3*  PROT 5.4*   ALBUMIN 2.4*    Recent Labs Lab 07/29/14 2318  LIPASE 52*    Recent Labs Lab 07/29/14 2318  AMMONIA 155*   CBC:  Recent Labs Lab 07/29/14 2318 07/29/14 2329  WBC 7.3  --   NEUTROABS 5.5  --   HGB 9.7* 10.5*  HCT 29.2* 31.0*  MCV 98.6  --   PLT 125*  --    Cardiac Enzymes: No results for input(s): CKTOTAL, CKMB, CKMBINDEX, TROPONINI in the last 168 hours.  BNP (last 3 results) No results for input(s): BNP in the last 8760 hours.  ProBNP (last 3 results) No results for input(s): PROBNP in the last 8760 hours.  CBG: No results for input(s): GLUCAP in the last 168 hours.  Radiological Exams on Admission: Dg Abd Acute W/chest  07/30/2014   CLINICAL DATA:  Initial evaluation for 1 week history of abdominal pain.  EXAM: DG  ABDOMEN ACUTE W/ 1V CHEST  COMPARISON:  Prior study from 06/04/2014  FINDINGS: The cardiac and mediastinal silhouettes are stable in size and contour, and remain within normal limits.  The lungs are normally inflated. No airspace consolidation, pleural effusion, or pulmonary edema is identified. There is no pneumothorax.  Bowel gas pattern within normal limits without evidence of obstruction or ileus. No abnormal bowel wall thickening. Moderate amount of retained stool within the colon. No free air. No soft tissue mass or abnormal calcification.  No acute osseus abnormality.  IMPRESSION: 1. Nonobstructive bowel gas pattern with no radiographic evidence for acute intra-abdominal process. 2. No active cardiopulmonary disease.   Electronically Signed   By: Rise Mu M.D.   On: 07/30/2014 00:32    EKG:  Not done in ED, will get one.   Assessment/Plan Principal Problem:   Abdominal pain Active Problems:   Anemia   Protein-calorie malnutrition, severe   Autoimmune hepatitis   Hypokalemia   Cirrhosis, non-alcoholic   Primary sclerosing cholangitis  Abdominal pain: Etiology is not clear. Lipase negative. CT-abd/pelvise negative. Given patient's  history of cirrhosis, suspect that patient may have SBP. Patient does not meet criteria for sepsis, but she has tachycardia and temperature 99.8, she is at the risk of developing sepsis. -will admit to tele bed -start ceftriaxone empirically -will get Procalcitonin and trend lactic acid levels per sepsis protocol. -IVF: 2L of NS bolus in ED, followed by 75 cc/h  -Zofran for nausea, morphine for pain -need to consult IR for paracentesis in the morning  Autoimmune hepatitis, PSC and Jaundice: Patient's lvier function has been worsened. Her Imuran was on held due to neutropenia in previous admission. Currently patient is taking prednisone. No biliary dilatation seen on MRCP in previous admission. As noted above, she is on the list of transplantation per pt. GI was consulted by ED -give one dose of solucortif 50 mg x1 -check cortisol level -continue home dose prednisone -follow up GI Recs. -check direct BR level  Cirrhosis: INR=1.39. On Lasix, spironolactone, and lactulose at home. She has diarrhea with loose stool, which is likely due to lactulose use, but cannot rule out c diff given abdominal pain. -will hold Lasix and spironolactone since patient is dehydrated secondary to nausea, vomiting -continue lactulose -check c diff pcr  Headache: unclear etiology. No meningeal signs. No signs of stroke. suspect secondary to sepsis from GI issues -Supportive care  Hypokalemia: K= 3.4 on admission. - Repleted   DVT ppx: SQ Heparin    Code Status: Full code Family Communication: None at bed side.  Disposition Plan: Admit to inpatient   Date of Service 07/30/2014    Lorretta Harp [facility] Pager [phone]  If 7PM-7AM, please contact night-coverage [email or web address] Password [email or web address] 07/30/2014, 2:09 AM

## 2014-07-30 NOTE — Procedures (Signed)
US guided diagnostic/therapeutic paracentesis performed yielding 2.5 liters clear, yellow fluid. A portion of the fluid was sent to the lab for preordered studies. No immediate complications.

## 2014-07-30 NOTE — ED Notes (Signed)
Patient reports head pain 4/10 on 0-10 pain scale. Denies nausea. Lab in room to draw Lactic Acid at this time.

## 2014-07-31 LAB — GRAM STAIN

## 2014-07-31 LAB — AMMONIA: Ammonia: 106 umol/L — ABNORMAL HIGH (ref 9–35)

## 2014-07-31 LAB — COMPREHENSIVE METABOLIC PANEL
ALT: 107 U/L — ABNORMAL HIGH (ref 14–54)
AST: 96 U/L — AB (ref 15–41)
Albumin: 2 g/dL — ABNORMAL LOW (ref 3.5–5.0)
Alkaline Phosphatase: 423 U/L — ABNORMAL HIGH (ref 38–126)
Anion gap: 8 (ref 5–15)
BILIRUBIN TOTAL: 3.6 mg/dL — AB (ref 0.3–1.2)
CALCIUM: 8 mg/dL — AB (ref 8.9–10.3)
CHLORIDE: 103 mmol/L (ref 101–111)
CO2: 21 mmol/L — ABNORMAL LOW (ref 22–32)
Creatinine, Ser: 0.55 mg/dL (ref 0.44–1.00)
GFR calc non Af Amer: >60 mL/min (ref >60)
Glucose, Bld: 125 mg/dL — ABNORMAL HIGH (ref 65–99)
Potassium: 3.6 mmol/L (ref 3.5–5.1)
Sodium: 132 mmol/L — ABNORMAL LOW (ref 135–145)
Total Protein: 5 g/dL — ABNORMAL LOW (ref 6.5–8.1)

## 2014-07-31 LAB — CBC
HCT: 25 % — ABNORMAL LOW (ref 36.0–46.0)
Hemoglobin: 8.3 g/dL — ABNORMAL LOW (ref 12.0–15.0)
MCH: 33.2 pg (ref 26.0–34.0)
MCHC: 33.2 g/dL (ref 30.0–36.0)
MCV: 100 fL (ref 78.0–100.0)
Platelets: 124 10*3/uL — ABNORMAL LOW (ref 150–400)
RBC: 2.5 MIL/uL — ABNORMAL LOW (ref 3.87–5.11)
RDW: 18.3 % — ABNORMAL HIGH (ref 11.5–15.5)
WBC: 3.8 10*3/uL — ABNORMAL LOW (ref 4.0–10.5)

## 2014-07-31 LAB — MAGNESIUM: Magnesium: 1.6 mg/dL — ABNORMAL LOW (ref 1.7–2.4)

## 2014-07-31 LAB — GLUCOSE, CAPILLARY: Glucose-Capillary: 119 mg/dL — ABNORMAL HIGH (ref 65–99)

## 2014-07-31 LAB — CLOSTRIDIUM DIFFICILE BY PCR: Toxigenic C. Difficile by PCR: NEGATIVE

## 2014-07-31 MED ORDER — MORPHINE SULFATE 2 MG/ML IJ SOLN
2.0000 mg | Freq: Once | INTRAMUSCULAR | Status: AC
  Administered 2014-07-31: 2 mg via INTRAVENOUS
  Filled 2014-07-31: qty 1

## 2014-07-31 MED ORDER — WHITE PETROLATUM GEL
Status: AC
  Filled 2014-07-31: qty 1

## 2014-07-31 NOTE — Discharge Instructions (Signed)
Follow with Primary MD [name],[name], MD and your GI doctor at [facility] in 7 days   Get CBC, CMP, 2 view Chest X ray checked  by Primary MD next visit.    Activity: As tolerated with Full fall precautions use walker/cane & assistance as needed   Disposition Home     Diet: Heart Healthy   For Heart failure patients - Check your Weight same time everyday, if you gain over 2 pounds, or you develop in leg swelling, experience more shortness of breath or chest pain, call your Primary MD immediately. Follow Cardiac Low Salt Diet and 1.5 lit/day fluid restriction.   On your next visit with your primary care physician please Get Medicines reviewed and adjusted.   Please request your Prim.MD to go over all Hospital Tests and Procedure/Radiological results at the follow up, please get all Hospital records sent to your Prim MD by signing hospital release before you go home.   If you experience worsening of your admission symptoms, develop shortness of breath, life threatening emergency, suicidal or homicidal thoughts you must seek medical attention immediately by calling 911 or calling your MD immediately  if symptoms less severe.  You Must read complete instructions/literature along with all the possible adverse reactions/side effects for all the Medicines you take and that have been prescribed to you. Take any new Medicines after you have completely understood and accpet all the possible adverse reactions/side effects.   Do not drive, operating heavy machinery, perform activities at heights, swimming or participation in water activities or provide baby sitting services if your were admitted for syncope or siezures until you have seen by Primary MD or a Neurologist and advised to do so again.  Do not drive when taking Pain medications.    Do not take more than prescribed Pain, Sleep and Anxiety Medications  Special Instructions: If you have smoked or chewed Tobacco  in the last 2 yrs please stop  smoking, stop any regular Alcohol  and or any Recreational drug use.  Wear Seat belts while driving.   Please note  You were cared for by a hospitalist during your hospital stay. If you have any questions about your discharge medications or the care you received while you were in the hospital after you are discharged, you can call the unit and asked to speak with the hospitalist on call if the hospitalist that took care of you is not available. Once you are discharged, your primary care physician will handle any further medical issues. Please note that NO REFILLS for any discharge medications will be authorized once you are discharged, as it is imperative that you return to your primary care physician (or establish a relationship with a primary care physician if you do not have one) for your aftercare needs so that they can reassess your need for medications and monitor your lab values.

## 2014-07-31 NOTE — Discharge Summary (Signed)
Melanie Conley, is a 24 y.o. female  DOB 04-27-1990  MRN [id number].  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 Liver disease   . Hepatitis   . Anemia   . Esophageal varices   . Autoimmune hepatitis   . GIB (gastrointestinal bleeding)   . Cirrhosis   . Primary sclerosing cholangitis     Past Surgical History  Procedure Laterality Date  . Gastric banding port revision    . Tonsillectomy    . Esophagogastroduodenoscopy  07/22/2011    Procedure: ESOPHAGOGASTRODUODENOSCOPY (EGD);  Surgeon: Graylin Shiver, MD;  Location: [facility] ENDOSCOPY;  Service: Endoscopy;  Laterality: N/A;       HPI  from the history and physical done on the day of admission:    Melanie Conley is a 24 y.o. 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   Patient states that she has abdominal pain, nausea and  vomiting for almost a week. Her abdominal pain is located in the central abdomen, 10 out of 10 in severity, persistent, nonradiating. It is not aggravated or alleviated by any known factors. She states she has loose stool, which she attributes to taking lactulose. She also has a severe headache, but no neck stiffness or neck pain. No rashes, unilateral weakness, numbness or tingling sensations. She does not have cough, chest pain, shortness of breath, symptoms for UTI. She cannot tell what medications she is taking exactly.  Of note, patient was hospitalized from 5/6 to 5/10 due to sepsis. She had diagnostic paracentesis which showed WBC only 43, not consistent with SBP. She had negative MRCP for signigicant abnormalities-no evidence of cholangitis. She was transfered to [facility] for evaluation for possible liver transplant. She states that she was put on the transplantation list. 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   In ED, patient was found to have worsening liver function from 06/08/14: ALP 289-533, AST 83-138, ALT 59-137, total bilirubin 8.4-5.3. Potassium is 3.4, lipase 52, INR 1.39, lactate 0.973 CT abdomen/pelvis did not show acute abnormalities. Patient is admitted to inpatient for further evaluation and treatment.     Hospital Course:  1. Abdominal pain along with nausea vomiting and headache. Could have been a viral infection however with her history of cirrhosis and past history of SBP imperative to rule out an ongoing SBP. Blood cultures ordered, ultrasound-guided paracentesis ordered, on IV Rocephin, already she is completely symptom free and feels better. GI to follow. Doesn't use was obtained and fluid was completely unremarkable with 25 WBCs, crusted with Dr. Ewing Schlein cleared for home discharge without any change in her medications. No anti-biotics. She is symptom-free will follow with her primary gastroenterologist in a week and PCP.   2. History of autoimmune  hepatitis/PSC. On transplant list at [facility], continue stress dose steroids,Ursodiol, lactulose, GI to see. Ultrasound-guided paracentesis as above. Liver enzymes improving. Diuretics on hold due to clinical dehydration.   3. Hypokalemia. Replaced monitor.   4. Headache. Could be from viral syndrome versus due to nausea vomiting. All resolved.       Discharge Condition: Stable  Follow UP  Follow-up Information    Follow up with [name],[name], MD. Schedule an appointment as soon as possible for a visit in 1 week.   Specialty:  Internal Medicine   Why:  and your GI MD       Consults obtained -  GI-[facility]  Diet and Activity recommendation: See Discharge Instructions below  Discharge Instructions       Discharge Instructions    Diet - low sodium heart healthy    Complete by:  As directed      Discharge instructions    Complete by:  As directed   Follow with Primary MD Woodfin Ganja, MD and your GI doctor at [facility] in 7 days   Get CBC, CMP, 2 view Chest X ray checked  by Primary MD next visit.    Activity: As tolerated with Full fall precautions use walker/cane & assistance as needed   Disposition Home     Diet: Heart Healthy   For Heart failure patients - Check your Weight same time everyday, if you gain over 2 pounds, or you develop in leg swelling, experience more shortness of breath or chest pain, call your Primary MD immediately. Follow Cardiac Low Salt Diet and 1.5 lit/day fluid restriction.   On your next visit with your primary care physician please Get Medicines reviewed and adjusted.   Please request your Prim.MD to go over all Hospital Tests and Procedure/Radiological results at the follow up, please get all Hospital records sent to your Prim MD by signing hospital release before you go home.   If you experience worsening of your admission symptoms, develop shortness of breath, life threatening emergency, suicidal or homicidal thoughts you must seek medical  attention immediately by calling 911 or calling your MD immediately  if symptoms less severe.  You Must read complete instructions/literature along with all the possible adverse reactions/side effects for all the Medicines you take and that have been prescribed to you. Take any new Medicines after you have completely understood and accpet all the possible adverse reactions/side effects.   Do not drive, operating heavy machinery, perform activities at heights, swimming or participation in water activities or provide baby sitting services if your were admitted for syncope or siezures until you have seen by Primary MD or a Neurologist and advised to do so again.  Do not drive when taking Pain medications.    Do not take more than prescribed Pain, Sleep and Anxiety Medications  Special Instructions: If you have smoked or chewed Tobacco  in the last 2 yrs please  stop smoking, stop any regular Alcohol  and or any Recreational drug use.  Wear Seat belts while driving.   Please note  You were cared for by a hospitalist during your hospital stay. If you have any questions about your discharge medications or the care you received while you were in the hospital after you are discharged, you can call the unit and asked to speak with the hospitalist on call if the hospitalist that took care of you is not available. Once you are discharged, your primary care physician will handle any further medical issues. Please note that NO REFILLS for any discharge medications will be authorized once you are discharged, as it is imperative that you return to your primary care physician (or establish a relationship with a primary care physician if you do not have one) for your aftercare needs so that they can reassess your need for medications and monitor your lab values.     Increase activity slowly    Complete by:  As directed              Discharge Medications       Medication List    TAKE these medications         albuterol (2.5 MG/3ML) 0.083% nebulizer solution  Commonly known as:  PROVENTIL  Take 2.5 mg by nebulization every 4 (four) hours as needed for wheezing or shortness of breath.     carvedilol 3.125 MG tablet  Commonly known as:  COREG  Take 3.125 mg by mouth 2 (two) times daily.     feeding supplement (RESOURCE BREEZE) Liqd  Take 1 Container by mouth 3 (three) times daily between meals.     furosemide 40 MG tablet  Commonly known as:  LASIX  Take 1 tablet (40 mg total) by mouth daily.     IMURAN 50 MG tablet  Generic drug:  azaTHIOprine  Take 50 mg by mouth daily.     lactulose 10 GM/15ML solution  Commonly known as:  CHRONULAC  Take 30 mLs (20 g total) by mouth 2 (two) times daily.     lidocaine 2 % solution  Commonly known as:  XYLOCAINE  Use as directed 200 mg in the mouth or throat 3 (three) times daily as needed. For GI / esophageal upset     omeprazole 40 MG capsule  Commonly known as:  PRILOSEC  Take 40 mg by mouth daily.     oxyCODONE 5 MG immediate release tablet  Commonly known as:  Oxy IR/ROXICODONE  Take 5 mg by mouth every 6 (six) hours as needed for moderate pain.     Oxycodone HCl 10 MG Tabs  Take 10 mg by mouth every 4 (four) hours as needed (for pain).     predniSONE 5 MG tablet  Commonly known as:  DELTASONE  Take 5 mg by mouth daily.     Protein Powd  Take 1 scoop by mouth 2 (two) times daily.     REGLAN 5 MG tablet  Generic drug:  metoCLOPramide  Take 5 mg by mouth 2 (two) times daily. 30 minutes before a meal     spironolactone 50 MG tablet  Commonly known as:  ALDACTONE  Take 100 mg by mouth daily.     ursodiol 300 MG capsule  Commonly known as:  ACTIGALL  Take 300 mg by mouth 2 (two) times daily.     valGANciclovir 450 MG tablet  Commonly known as:  VALCYTE  Take 900 mg by mouth daily.  Major procedures and Radiology Reports - PLEASE review detailed and final reports for all details, in brief -       [place]  Paracentesis  07/30/2014   INDICATION: Patient with history of cirrhosis, autoimmune hepatitis with PSC overlap syndrome, recurrent ascites, abdominal pain. Request is made for diagnostic and therapeutic paracentesis.  EXAM: ULTRASOUND-GUIDED DIAGNOSTIC AND THERAPEUTIC PARACENTESIS  COMPARISON:  Prior paracentesis on 06/04/2014  MEDICATIONS: None.  COMPLICATIONS: None immediate  TECHNIQUE: Informed written consent was obtained from the patient after a discussion of the risks, benefits and alternatives to treatment. A timeout was performed prior to the initiation of the procedure.  Initial ultrasound scanning demonstrates a small to moderate amount of ascites within the right lower abdominal quadrant. The right lower abdomen was prepped and draped in the usual sterile fashion. 1% lidocaine was used for local anesthesia. Under direct ultrasound guidance, a 19 gauge, 7-cm, Yueh catheter was introduced. An ultrasound image was saved for documentation purposed. The paracentesis was performed. The catheter was removed and a dressing was applied. The patient tolerated the procedure well without immediate post procedural complication.  FINDINGS: A total of approximately 2.5 liters of clear, yellow fluid was removed. A portion of the fluid was submitted to the lab for preordered studies.  IMPRESSION: Successful ultrasound-guided diagnostic and therapeutic paracentesis yielding 2.5 liters of peritoneal fluid.  Read by: Jeananne RamaKevin Allred, PA-C   Electronically Signed   By: Corlis Leak  Hassell M.D.   On: 07/30/2014 14:24   Dg Abd Acute W/chest  07/30/2014   CLINICAL DATA:  Initial evaluation for 1 week history of abdominal pain.  EXAM: DG ABDOMEN ACUTE W/ 1V CHEST  COMPARISON:  Prior study from 06/04/2014  FINDINGS: The cardiac and mediastinal silhouettes are stable in size and contour, and remain within normal limits.  The lungs are normally inflated. No airspace consolidation, pleural effusion, or pulmonary edema is identified. There is  no pneumothorax.  Bowel gas pattern within normal limits without evidence of obstruction or ileus. No abnormal bowel wall thickening. Moderate amount of retained stool within the colon. No free air. No soft tissue mass or abnormal calcification.  No acute osseus abnormality.  IMPRESSION: 1. Nonobstructive bowel gas pattern with no radiographic evidence for acute intra-abdominal process. 2. No active cardiopulmonary disease.   Electronically Signed   By: Rise MuBenjamin  McClintock M.D.   On: 07/30/2014 00:32    Micro Results      Recent Results (from the past 240 hour(s))  Gram stain     Status: None   Collection Time: 07/30/14  1:21 PM  Result Value Ref Range Status   Specimen Description PERITONEAL FLUID  Final   Special Requests NONE  Final   Gram Stain   Final    WBC PRESENT, PREDOMINANTLY MONONUCLEAR NO ORGANISMS SEEN CYTOSPIN SMEAR    Report Status 07/31/2014 FINAL  Final  Clostridium Difficile by PCR (not at [facility])     Status: None   Collection Time: 07/30/14 10:44 PM  Result Value Ref Range Status   C difficile by pcr NEGATIVE NEGATIVE Final       Today   Subjective    Melanie Conley today has no headache,no chest abdominal pain,no new weakness tingling or numbness, feels much better wants to go home today.     Objective   Blood pressure 107/64, pulse 88, temperature 98.6 F (37 C), temperature source Oral, resp. rate 17, height 4\' 11"  (1.499 m), weight 45.1 kg (99 lb 6.8 oz), SpO2 100 %.  Intake/Output Summary (Last 24 hours) at 07/31/14 1016 Last data filed at 07/31/14 0816  Gross per 24 hour  Intake   2225 ml  Output    300 ml  Net   1925 ml    Exam Awake Alert, Oriented x 3, No new F.N deficits, Normal affect [place].AT,PERRAL Supple Neck,No JVD, No cervical lymphadenopathy appriciated.  Symmetrical Chest wall movement, Good air movement bilaterally, CTAB RRR,No Gallops,Rubs or new Murmurs, No Parasternal Heave +ve B.Sounds, Abd Soft, Non tender, No organomegaly  appriciated, No rebound -guarding or rigidity. No Cyanosis, Clubbing or edema, No new Rash or bruise   Data Review   CBC w Diff: Lab Results  Component Value Date   WBC 3.8* 07/31/2014   HGB 8.3* 07/31/2014   HCT 25.0* 07/31/2014   PLT 124* 07/31/2014   LYMPHOPCT 4* 07/29/2014   BANDSPCT 0 07/22/2011   MONOPCT 21* 07/29/2014   EOSPCT 0 07/29/2014   BASOPCT 0 07/29/2014    CMP: Lab Results  Component Value Date   NA 132* 07/31/2014   K 3.6 07/31/2014   CL 103 07/31/2014   CO2 21* 07/31/2014   BUN <5* 07/31/2014   CREATININE 0.55 07/31/2014   PROT 5.0* 07/31/2014   ALBUMIN 2.0* 07/31/2014   BILITOT 3.6* 07/31/2014   ALKPHOS 423* 07/31/2014   AST 96* 07/31/2014   ALT 107* 07/31/2014  .   Total Time in preparing paper work, data evaluation and todays exam - 35 minutes  Leroy Sea M.D on 07/31/2014 at 10:16 AM  [facility]   Office  [phone]

## 2014-08-04 ENCOUNTER — Ambulatory Visit (INDEPENDENT_AMBULATORY_CARE_PROVIDER_SITE_OTHER): Payer: BC Managed Care – PPO | Admitting: Adult Health

## 2014-08-04 ENCOUNTER — Encounter: Payer: Self-pay | Admitting: Adult Health

## 2014-08-04 ENCOUNTER — Other Ambulatory Visit (HOSPITAL_COMMUNITY)
Admission: RE | Admit: 2014-08-04 | Discharge: 2014-08-04 | Disposition: A | Discharge status: Home / Self Care | Payer: BC Managed Care – PPO | Source: Ambulatory Visit | Attending: Adult Health | Admitting: Adult Health

## 2014-08-04 VITALS — BP 82/50 | HR 68 | Ht 59.0 in | Wt 98.5 lb

## 2014-08-04 DIAGNOSIS — Z01419 Encounter for gynecological examination (general) (routine) without abnormal findings: Secondary | ICD-10-CM | POA: Diagnosis not present

## 2014-08-04 DIAGNOSIS — Z3009 Encounter for other general counseling and advice on contraception: Secondary | ICD-10-CM

## 2014-08-04 DIAGNOSIS — Z01411 Encounter for gynecological examination (general) (routine) with abnormal findings: Secondary | ICD-10-CM | POA: Insufficient documentation

## 2014-08-04 DIAGNOSIS — Z113 Encounter for screening for infections with a predominantly sexual mode of transmission: Secondary | ICD-10-CM | POA: Diagnosis present

## 2014-08-04 DIAGNOSIS — Z3202 Encounter for pregnancy test, result negative: Secondary | ICD-10-CM

## 2014-08-04 DIAGNOSIS — N926 Irregular menstruation, unspecified: Secondary | ICD-10-CM | POA: Insufficient documentation

## 2014-08-04 LAB — CULTURE, BLOOD (ROUTINE X 2)
Culture: NO GROWTH
Culture: NO GROWTH

## 2014-08-04 LAB — CULTURE, BODY FLUID-BOTTLE: Culture: NO GROWTH

## 2014-08-04 LAB — CULTURE, BODY FLUID W GRAM STAIN -BOTTLE

## 2014-08-04 LAB — TOTAL BILIRUBIN, BODY FLUID

## 2014-08-04 LAB — POCT URINE PREGNANCY: Preg Test, Ur: NEGATIVE

## 2014-08-04 NOTE — Progress Notes (Signed)
Patient ID: Melanie Conley, female   DOB: Mar 08, 1990, 24 y.o.   MRN: [id number] History of Present Illness: Melanie Conley is a 24 year old black female, new to this practice, in for a well woman gyn exam and pap, she has family planning medicaid.She has cirrhosis of the liver and auto immune hepatitis and history of GI bleeding.She said this started at age 24.   Current Medications, Allergies, Past Medical History, Past Surgical History, Family History and Social History were reviewed in Melanie Conley [facility] electronic medical record.     Review of Systems: Patient denies any headaches, hearing loss, fatigue, blurred vision, shortness of breath, chest pain, abdominal pain, problems with bowel movements, urination, or intercourse. No joint pain or mood swings.She is having irregular periods.She has lost some weight over the last year.    Physical Exam:BP 82/50 mmHg  Pulse 68  Ht 4\' 11"  (1.499 m)  Wt 98 lb 8 oz (44.679 kg)  BMI 19.88 kg/m2  LMP 05/29/2014 (Approximate) UPT negative General:  Well developed, and thin, no acute distress Skin:  Warm and dry Neck:  Midline trachea, normal thyroid, good ROM, no lymphadenopathy Lungs; Clear to auscultation bilaterally Breast:  No dominant palpable mass, retraction, or nipple discharge Cardiovascular: Regular rate and rhythm Abdomen:  Soft, non tender, liver slightly enlarged and abdomen has striae. Pelvic:  External genitalia is normal in appearance, no lesions.  The vagina is normal in appearance. Urethra has no lesions or masses. The cervix is smooth, pap with GC/CHL performed.  Uterus is felt to be normal size, shape, and contour.  No adnexal masses or tenderness noted.Bladder is non tender, no masses felt. Extremities/musculoskeletal:  No swelling or varicosities noted, no clubbing or cyanosis Psych:  No mood changes, alert and cooperative,seems happy I discussed birth control with her and she declines, encouraged to use condoms and talk with liver  doctor.Will watch periods for now.  Impression: Well woman gyn exam with pap, has family planning medicaid Irregular periods    Plan: Talk with liver doctor about pregnancy in future Use condoms Check HIV,RPR and HSV2 Physical in 1 year

## 2014-08-04 NOTE — Patient Instructions (Signed)
Use condoms Talk with liver doctor about pregnancy in future Physical in 1 year

## 2014-08-05 LAB — RPR: RPR Ser Ql: NONREACTIVE

## 2014-08-05 LAB — CYTOLOGY - PAP

## 2014-08-05 LAB — HSV 2 ANTIBODY, IGG: HSV 2 Glycoprotein G Ab, IgG: <0.91 index (ref 0.00–0.90)

## 2014-08-05 LAB — HIV ANTIBODY (ROUTINE TESTING W REFLEX): HIV Screen 4th Generation wRfx: NONREACTIVE

## 2014-08-07 ENCOUNTER — Emergency Department (HOSPITAL_COMMUNITY)
Admission: EM | Admit: 2014-08-07 | Discharge: 2014-08-08 | Disposition: A | Discharge status: Home / Self Care | Payer: BC Managed Care – PPO | Attending: Emergency Medicine | Admitting: Emergency Medicine

## 2014-08-07 ENCOUNTER — Encounter (HOSPITAL_COMMUNITY): Payer: Self-pay

## 2014-08-07 DIAGNOSIS — R52 Pain, unspecified: Secondary | ICD-10-CM | POA: Diagnosis present

## 2014-08-07 DIAGNOSIS — Z8742 Personal history of other diseases of the female genital tract: Secondary | ICD-10-CM | POA: Diagnosis not present

## 2014-08-07 DIAGNOSIS — K219 Gastro-esophageal reflux disease without esophagitis: Secondary | ICD-10-CM | POA: Insufficient documentation

## 2014-08-07 DIAGNOSIS — Z79899 Other long term (current) drug therapy: Secondary | ICD-10-CM | POA: Insufficient documentation

## 2014-08-07 DIAGNOSIS — Z862 Personal history of diseases of the blood and blood-forming organs and certain disorders involving the immune mechanism: Secondary | ICD-10-CM | POA: Diagnosis not present

## 2014-08-07 DIAGNOSIS — E876 Hypokalemia: Secondary | ICD-10-CM | POA: Insufficient documentation

## 2014-08-07 DIAGNOSIS — K830 Cholangitis: Secondary | ICD-10-CM | POA: Diagnosis not present

## 2014-08-07 DIAGNOSIS — Z7952 Long term (current) use of systemic steroids: Secondary | ICD-10-CM | POA: Insufficient documentation

## 2014-08-07 DIAGNOSIS — K8301 Primary sclerosing cholangitis: Secondary | ICD-10-CM

## 2014-08-07 DIAGNOSIS — R109 Unspecified abdominal pain: Secondary | ICD-10-CM

## 2014-08-07 LAB — COMPREHENSIVE METABOLIC PANEL
ALT: 45 U/L (ref 14–54)
AST: 54 U/L — AB (ref 15–41)
Albumin: 2.5 g/dL — ABNORMAL LOW (ref 3.5–5.0)
Alkaline Phosphatase: 434 U/L — ABNORMAL HIGH (ref 38–126)
Anion gap: 9 (ref 5–15)
BILIRUBIN TOTAL: 4.7 mg/dL — AB (ref 0.3–1.2)
BUN: <5 mg/dL — ABNORMAL LOW (ref 6–20)
CALCIUM: 7.9 mg/dL — AB (ref 8.9–10.3)
CO2: 30 mmol/L (ref 22–32)
CREATININE: 0.69 mg/dL (ref 0.44–1.00)
Chloride: 94 mmol/L — ABNORMAL LOW (ref 101–111)
GFR calc Af Amer: >60 mL/min (ref >60)
GFR calc non Af Amer: >60 mL/min (ref >60)
Glucose, Bld: 99 mg/dL (ref 65–99)
Potassium: 2.1 mmol/L — CL (ref 3.5–5.1)
Sodium: 133 mmol/L — ABNORMAL LOW (ref 135–145)
Total Protein: 6 g/dL — ABNORMAL LOW (ref 6.5–8.1)

## 2014-08-07 LAB — CBC WITH DIFFERENTIAL/PLATELET
BASOS PCT: 1 % (ref 0–1)
Basophils Absolute: 0 10*3/uL (ref 0.0–0.1)
EOS ABS: 0 10*3/uL (ref 0.0–0.7)
Eosinophils Relative: 0 % (ref 0–5)
HEMATOCRIT: 30.4 % — AB (ref 36.0–46.0)
HEMOGLOBIN: 10.2 g/dL — AB (ref 12.0–15.0)
Lymphocytes Relative: 9 % — ABNORMAL LOW (ref 12–46)
Lymphs Abs: 0.5 10*3/uL — ABNORMAL LOW (ref 0.7–4.0)
MCH: 33.4 pg (ref 26.0–34.0)
MCHC: 33.6 g/dL (ref 30.0–36.0)
MCV: 99.7 fL (ref 78.0–100.0)
MONO ABS: 1.3 10*3/uL — AB (ref 0.1–1.0)
MONOS PCT: 24 % — AB (ref 3–12)
Neutro Abs: 3.4 10*3/uL (ref 1.7–7.7)
Neutrophils Relative %: 66 % (ref 43–77)
Platelets: 116 10*3/uL — ABNORMAL LOW (ref 150–400)
RBC: 3.05 MIL/uL — ABNORMAL LOW (ref 3.87–5.11)
RDW: 19.1 % — ABNORMAL HIGH (ref 11.5–15.5)
WBC: 5.2 10*3/uL (ref 4.0–10.5)

## 2014-08-07 MED ORDER — POTASSIUM CHLORIDE 10 MEQ/100ML IV SOLN
10.0000 meq | INTRAVENOUS | Status: AC
  Administered 2014-08-08 (×2): 10 meq via INTRAVENOUS
  Filled 2014-08-07 (×2): qty 100

## 2014-08-07 MED ORDER — IOHEXOL 300 MG/ML  SOLN
25.0000 mL | Freq: Once | INTRAMUSCULAR | Status: AC | PRN
  Administered 2014-08-07: 25 mL via ORAL

## 2014-08-07 MED ORDER — SODIUM CHLORIDE 0.9 % IV SOLN
1000.0000 mL | Freq: Once | INTRAVENOUS | Status: AC
  Administered 2014-08-08: 1000 mL via INTRAVENOUS

## 2014-08-07 MED ORDER — SODIUM CHLORIDE 0.9 % IV SOLN
1000.0000 mL | INTRAVENOUS | Status: DC
  Administered 2014-08-08: 1000 mL via INTRAVENOUS

## 2014-08-07 MED ORDER — HYDROMORPHONE HCL 1 MG/ML IJ SOLN
1.0000 mg | Freq: Once | INTRAMUSCULAR | Status: AC
  Administered 2014-08-08: 1 mg via INTRAVENOUS
  Filled 2014-08-07: qty 1

## 2014-08-07 NOTE — ED Notes (Signed)
Pt reports body aches, headache, congestion and over all not feeling well.

## 2014-08-07 NOTE — ED Notes (Signed)
CRITICAL VALUE ALERT  Critical value received: Potassium 2.1  Date of notification:  08/07/2014  Time of notification:  2138  Critical value read back:Yes.    Nurse who received alert:  Suzy BouchardKaren N., RN  MD notified (1st page): Dr. Freida BusmanAllen  Time of first page:  2138  MD notified (2nd page):  Time of second page:  Responding MD:  Dr. Freida BusmanAllen  Time MD responded:  2138

## 2014-08-07 NOTE — ED Provider Notes (Addendum)
CSN: [id number]     Arrival date & time 08/07/14  1959 History  This chart was scribed for Azalia BilisKevin Olivine Hiers, MD by Octavia HeirArianna Nassar, ED Scribe. This patient was seen in room A04C/A04C and the patient's care was started at 11:19 PM.    Chief Complaint  Patient presents with  . Generalized Body Aches      The history is provided by the patient. No language interpreter was used.   HPI Comments:  Melanie Conley is a 24 y.o. 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 Pt also has an associated headache with cough and right ear pain. She was discharged from the hospital on 7/2 and notes she did not have any abdominal pain at that time. Pt denies dysuria, frequent urination, burning urine, pelvic complaints, nausea, vomiting, melena, and loss of appetite.   [facility] Past Medical History  Diagnosis Date  . Liver disease   . Hepatitis   . Anemia   . Esophageal varices   . Autoimmune hepatitis   . GIB (gastrointestinal bleeding)   . Cirrhosis   . Primary sclerosing cholangitis   . Irregular periods 08/04/2014   Past Surgical History  Procedure Laterality Date  . Gastric banding port revision    . Tonsillectomy    . Esophagogastroduodenoscopy  07/22/2011    Procedure: ESOPHAGOGASTRODUODENOSCOPY (EGD);  Surgeon: Graylin ShiverSalem F Ganem, MD;  [facility];  Service: Endoscopy;  Laterality: N/A;   Family History  Problem Relation Age of Onset  . Adopted: Yes   History  Substance Use Topics  . Smoking status: Never Smoker   . Smokeless tobacco: Never Used  . Alcohol Use: No   OB History    Gravida Para Term Preterm AB TAB SAB Ectopic Multiple Living   0 0 0 0 0 0 0 0 0 0      Review of Systems  A complete 10 system review of systems was obtained and all systems are  negative except as noted in the HPI and PMH.    Allergies  Review of patient's allergies indicates no known allergies.  Home Medications   Prior to Admission medications   Medication Sig Start Date End Date Taking? Authorizing Provider  albuterol (PROVENTIL) (2.5 MG/3ML) 0.083% nebulizer solution Take 2.5 mg by nebulization every 4 (four) hours as needed for wheezing or shortness of breath.  07/14/14   Historical Provider, MD  azaTHIOprine (IMURAN) 50 MG tablet Take 50 mg by mouth daily. 06/21/14   Historical Provider, MD  carvedilol (COREG) 3.125 MG tablet Take 3.125 mg by mouth 2 (two) times daily. 06/17/14 06/17/15  Historical Provider, MD  feeding supplement, RESOURCE BREEZE, (RESOURCE BREEZE) LIQD Take 1 Container by mouth 3 (three) times daily between meals. Patient taking differently: Take 1 Container by mouth 6 (six) times daily.  06/05/14   Shanker Levora DredgeM Ghimire, MD  furosemide (LASIX) 40 MG tablet Take 1 tablet (40 mg total) by mouth daily. 06/05/14   Shanker Levora DredgeM Ghimire, MD  lactulose (CHRONULAC) 10 GM/15ML solution Take 30 mLs (20 g total) by mouth 2 (two) times daily. Patient taking differently: Take 20 g by mouth as needed.  06/07/14   Shanker Levora DredgeM Ghimire, MD  lidocaine (XYLOCAINE) 2 % solution Use as directed 200 mg in the mouth or throat 3 (three) times daily as needed. For  GI / esophageal upset 07/14/14   Historical Provider, MD  metoCLOPramide (REGLAN) 5 MG tablet Take 5 mg by mouth 2 (two) times daily. 30 minutes before a meal 07/26/14   Historical Provider, MD  omeprazole (PRILOSEC) 40 MG capsule Take 40 mg by mouth daily.    Historical Provider, MD  oxyCODONE (OXY IR/ROXICODONE) 5 MG immediate release tablet Take 5 mg by mouth every 6 (six) hours as needed for moderate pain.  06/17/14   Historical Provider, MD  Oxycodone HCl 10 MG TABS Take 10 mg by mouth every 4 (four) hours as needed (for pain).  06/23/14   Historical Provider, MD  predniSONE (DELTASONE) 5 MG tablet Take 5 mg by mouth daily.     Historical Provider, MD  Protein POWD Take 1 scoop by mouth 2 (two) times daily.    Historical Provider, MD  spironolactone (ALDACTONE) 50 MG tablet Take 100 mg by mouth daily.     Historical Provider, MD  ursodiol (ACTIGALL) 300 MG capsule Take 300 mg by mouth 2 (two) times daily.     Historical Provider, MD  valGANciclovir (VALCYTE) 450 MG tablet Take 900 mg by mouth daily. 07/08/14 10/06/14  Historical Provider, MD   Triage vitals: BP 113/74 mmHg  Pulse 104  Temp(Src) 98.9 F (37.2 C) (Oral)  Resp 18  Ht 4\' 11"  (1.499 m)  Wt 100 lb (45.36 kg)  BMI 20.19 kg/m2  SpO2 97%  LMP 05/29/2014 (Approximate) Physical Exam  Constitutional: She is oriented to person, place, and time. She appears well-developed and well-nourished. No distress.  HENT:  Head: Normocephalic and atraumatic.  Eyes: EOM are normal.  Neck: Normal range of motion.  Cardiovascular: Normal rate, regular rhythm and normal heart sounds.   Pulmonary/Chest: Effort normal and breath sounds normal.  Abdominal: Soft. She exhibits distension. There is tenderness.  Diffuse generalized tenderness of abdomen with mild distention  reduceable umbilical hernia  Musculoskeletal: Normal range of motion.  Neurological: She is alert and oriented to person, place, and time.  Skin: Skin is warm and dry.  Psychiatric: She has a normal mood and affect. Judgment normal.  Nursing note and vitals reviewed.   ED Course  Procedures  DIAGNOSTIC STUDIES: Oxygen Saturation is 97% on RA, normal by my interpretation.  COORDINATION OF CARE:  11:23 PM Discussed treatment plan which includes CT abdomen, urinalysis with pt at bedside and pt agreed to plan.  Labs Review Labs Reviewed  CBC WITH DIFFERENTIAL/PLATELET - Abnormal; Notable for the following:    RBC 3.05 (*)    Hemoglobin 10.2 (*)    HCT 30.4 (*)    RDW 19.1 (*)    Platelets 116 (*)    Lymphocytes Relative 9 (*)    Lymphs Abs 0.5 (*)    Monocytes Relative 24 (*)    Monocytes  Absolute 1.3 (*)    All other components within normal limits  COMPREHENSIVE METABOLIC PANEL - Abnormal; Notable for the following:    Sodium 133 (*)    Potassium 2.1 (*)    Chloride 94 (*)    BUN <5 (*)    Calcium 7.9 (*)    Total Protein 6.0 (*)    Albumin 2.5 (*)    AST 54 (*)    Alkaline Phosphatase 434 (*)    Total Bilirubin 4.7 (*)    All other components within normal limits  URINALYSIS, ROUTINE W REFLEX MICROSCOPIC (NOT AT [facility]) - Abnormal; Notable for the following:    Color, Urine AMBER (*)  Bilirubin Urine SMALL (*)    All other components within normal limits  POTASSIUM - Abnormal; Notable for the following:    Potassium 2.2 (*)    All other components within normal limits  I-STAT BETA HCG BLOOD, ED ([facility], [facility], [facility] ONLY)    Imaging Review Ct Abdomen Pelvis W Contrast  08/08/2014   CLINICAL DATA:  24 year old female with abdominal pain and fullness  EXAM: CT ABDOMEN AND PELVIS WITH CONTRAST  TECHNIQUE: Multidetector CT imaging of the abdomen and pelvis was performed using the standard protocol following bolus administration of intravenous contrast.  CONTRAST:  80mL OMNIPAQUE IOHEXOL 300 MG/ML  SOLN  COMPARISON:  Ultrasound dated 06/04/2014 and MRI dated 06/04/2014 abdominal CT dated 01/12/2014  FINDINGS: The visualized lung bases are clear. No intra-abdominal free air. The large ascites.  Cirrhosis. The gallbladder, pancreas appear unremarkable. Splenomegaly measuring up to 15 cm. Multiple which a splenic hypodensities compatible with areas of infarct. The adrenal glands, kidneys, and the urinary bladder appear unremarkable. The uterus and the ovaries are grossly unremarkable.  Oral contrast opacifies the stomach and loops of small bowel and traverses into the colon without evidence of obstruction. 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 The appendix is unremarkable.  The the abdominal aorta  and IVC are patent. There is a sacral aortic left renal vein. The main portal vein is diminutive but remains patent. No portal venous gas identified. No lymphadenopathy. Perisplenic, perigastric, and paresthesia wall varices noted. There is recanalization of the umbilical vein compatible with portal hypertension. The osseous structures are unremarkable.  IMPRESSION: Cirrhosis with portal hypertension, splenomegaly and upper abdominal varices.  Multiple splenic infarcts.  Large ascites.  Diffuse thickening of the jejunal folds and colon most compatible with hepatic enteropathy. No bowel obstruction.   Electronically Signed   By: Elgie Collard M.D.   On: 08/08/2014 02:50  I personally reviewed the imaging tests through PACS system I reviewed available ER/hospitalization records through the EMR    EKG Interpretation   Date/Time:  Sunday August 07 2014 23:31:44 EDT Ventricular Rate:  95 PR Interval:  145 QRS Duration: 87 QT Interval:  466 QTC Calculation: 586 R Axis:   5 Text Interpretation:  Sinus rhythm Probable left ventricular hypertrophy  Prolonged QT interval QT has lengthened since last ecg Confirmed by Rio Kidane   MD, Wallice Granville ([id number]) on 08/08/2014 12:16:02 AM      MDM   Final diagnoses:  Hypokalemia  Primary sclerosing cholangitis  Abdominal pain, unspecified abdominal location    Patient feels much better at this time.  Discharge home in good condition.  Patient be placed on a very short course of oral potassium for her hypokalemia.  She understands she will need her potassium rechecked this week by her primary care physician.  She understands return the ER for new or worsening symptoms.   I personally performed the services described in this documentation, which was scribed in my presence. The recorded information has been reviewed and is accurate.     Azalia Bilis, MD 08/08/14 [id number]  Azalia Bilis, MD 08/08/14 [phone]

## 2014-08-07 NOTE — ED Notes (Signed)
The pt is c/o pain all over her body  For 3 days rt ear feels   Blocked  abd pain   lmp is irregular and her last period was 2 months ago  They are irregular.

## 2014-08-08 ENCOUNTER — Telehealth: Payer: Self-pay | Admitting: Adult Health

## 2014-08-08 ENCOUNTER — Emergency Department (HOSPITAL_COMMUNITY): Payer: BC Managed Care – PPO

## 2014-08-08 ENCOUNTER — Encounter (HOSPITAL_COMMUNITY): Payer: Self-pay | Admitting: Radiology

## 2014-08-08 LAB — URINALYSIS, ROUTINE W REFLEX MICROSCOPIC
Glucose, UA: NEGATIVE mg/dL
Hgb urine dipstick: NEGATIVE
KETONES UR: NEGATIVE mg/dL
LEUKOCYTES UA: NEGATIVE
Nitrite: NEGATIVE
PROTEIN: NEGATIVE mg/dL
SPECIFIC GRAVITY, URINE: 1.015 (ref 1.005–1.030)
Urobilinogen, UA: 1 mg/dL (ref 0.0–1.0)
pH: 5 (ref 5.0–8.0)

## 2014-08-08 LAB — I-STAT BETA HCG BLOOD, ED (MC, WL, AP ONLY): I-stat hCG, quantitative: <5 m[IU]/mL (ref <5)

## 2014-08-08 LAB — POTASSIUM: Potassium: 2.2 mmol/L — CL (ref 3.5–5.1)

## 2014-08-08 MED ORDER — HYDROMORPHONE HCL 1 MG/ML IJ SOLN
1.0000 mg | Freq: Once | INTRAMUSCULAR | Status: AC
  Administered 2014-08-08: 1 mg via INTRAVENOUS
  Filled 2014-08-08: qty 1

## 2014-08-08 MED ORDER — IOHEXOL 300 MG/ML  SOLN
80.0000 mL | Freq: Once | INTRAMUSCULAR | Status: AC | PRN
  Administered 2014-08-08: 80 mL via INTRAVENOUS

## 2014-08-08 MED ORDER — POTASSIUM CHLORIDE ER 10 MEQ PO TBCR: 10.0000 meq | EXTENDED_RELEASE_TABLET | Freq: Once | ORAL | Status: DC

## 2014-08-08 MED ORDER — POTASSIUM CHLORIDE CRYS ER 20 MEQ PO TBCR
40.0000 meq | EXTENDED_RELEASE_TABLET | Freq: Once | ORAL | Status: AC
Start: 2014-08-08 — End: 2014-08-08
  Administered 2014-08-08: 40 meq via ORAL
  Filled 2014-08-08: qty 2

## 2014-08-08 MED ORDER — IOHEXOL 300 MG/ML  SOLN
25.0000 mL | Freq: Once | INTRAMUSCULAR | Status: AC | PRN
  Administered 2014-08-08: 25 mL via ORAL

## 2014-08-08 NOTE — Telephone Encounter (Signed)
Left message to call about pap, was abnormal needs colpo

## 2014-08-08 NOTE — ED Notes (Signed)
Pt screaming in room stating that her IV is burning, stopped infusion and flushed with saline. Burning stopped, pt calm. Restarted potassium at lower rate.

## 2014-08-08 NOTE — ED Notes (Signed)
2nd pot run infused

## 2014-08-08 NOTE — Discharge Instructions (Signed)

## 2014-08-08 NOTE — ED Notes (Signed)
The pt has a headache now all  Her other pain is gone

## 2014-08-08 NOTE — ED Notes (Signed)
The has finished her iv meds   Waiting for d/c instructions

## 2014-08-09 ENCOUNTER — Telehealth: Payer: Self-pay | Admitting: Adult Health

## 2014-08-09 NOTE — Telephone Encounter (Signed)
Pt aware pap smear was abnormal LSIL and needs colpo. Will make appt

## 2014-08-10 ENCOUNTER — Encounter (HOSPITAL_COMMUNITY): Payer: Self-pay | Admitting: Emergency Medicine

## 2014-08-10 ENCOUNTER — Emergency Department (HOSPITAL_COMMUNITY)
Admission: EM | Admit: 2014-08-10 | Discharge: 2014-08-11 | Payer: BC Managed Care – PPO | Attending: Emergency Medicine | Admitting: Emergency Medicine

## 2014-08-10 DIAGNOSIS — D61818 Other pancytopenia: Secondary | ICD-10-CM | POA: Insufficient documentation

## 2014-08-10 DIAGNOSIS — E876 Hypokalemia: Secondary | ICD-10-CM | POA: Diagnosis not present

## 2014-08-10 DIAGNOSIS — Z7952 Long term (current) use of systemic steroids: Secondary | ICD-10-CM | POA: Insufficient documentation

## 2014-08-10 DIAGNOSIS — K754 Autoimmune hepatitis: Secondary | ICD-10-CM | POA: Insufficient documentation

## 2014-08-10 DIAGNOSIS — Z8742 Personal history of other diseases of the female genital tract: Secondary | ICD-10-CM | POA: Insufficient documentation

## 2014-08-10 DIAGNOSIS — Z79899 Other long term (current) drug therapy: Secondary | ICD-10-CM | POA: Diagnosis not present

## 2014-08-10 DIAGNOSIS — E871 Hypo-osmolality and hyponatremia: Secondary | ICD-10-CM

## 2014-08-10 DIAGNOSIS — K745 Biliary cirrhosis, unspecified: Secondary | ICD-10-CM | POA: Insufficient documentation

## 2014-08-10 DIAGNOSIS — K7460 Unspecified cirrhosis of liver: Secondary | ICD-10-CM | POA: Diagnosis not present

## 2014-08-10 DIAGNOSIS — N289 Disorder of kidney and ureter, unspecified: Secondary | ICD-10-CM | POA: Diagnosis not present

## 2014-08-10 DIAGNOSIS — R109 Unspecified abdominal pain: Secondary | ICD-10-CM | POA: Diagnosis present

## 2014-08-10 NOTE — ED Notes (Signed)
Pt in EMS from home. Abd pain X2 days. No N/V, reports diarrhea for past day. Pt also reports lower back pain. Hx of cirrhosis and hernia.

## 2014-08-11 ENCOUNTER — Encounter (HOSPITAL_COMMUNITY): Payer: Self-pay | Admitting: Emergency Medicine

## 2014-08-11 ENCOUNTER — Emergency Department (HOSPITAL_COMMUNITY): Payer: BC Managed Care – PPO

## 2014-08-11 DIAGNOSIS — K745 Biliary cirrhosis, unspecified: Secondary | ICD-10-CM | POA: Diagnosis not present

## 2014-08-11 DIAGNOSIS — E876 Hypokalemia: Secondary | ICD-10-CM | POA: Diagnosis not present

## 2014-08-11 DIAGNOSIS — K754 Autoimmune hepatitis: Secondary | ICD-10-CM | POA: Diagnosis not present

## 2014-08-11 DIAGNOSIS — Z7952 Long term (current) use of systemic steroids: Secondary | ICD-10-CM | POA: Diagnosis not present

## 2014-08-11 DIAGNOSIS — Z79899 Other long term (current) drug therapy: Secondary | ICD-10-CM | POA: Diagnosis not present

## 2014-08-11 DIAGNOSIS — N289 Disorder of kidney and ureter, unspecified: Secondary | ICD-10-CM | POA: Diagnosis not present

## 2014-08-11 DIAGNOSIS — E871 Hypo-osmolality and hyponatremia: Secondary | ICD-10-CM | POA: Diagnosis not present

## 2014-08-11 DIAGNOSIS — K7460 Unspecified cirrhosis of liver: Secondary | ICD-10-CM | POA: Diagnosis not present

## 2014-08-11 DIAGNOSIS — D61818 Other pancytopenia: Secondary | ICD-10-CM | POA: Diagnosis not present

## 2014-08-11 DIAGNOSIS — Z8742 Personal history of other diseases of the female genital tract: Secondary | ICD-10-CM | POA: Diagnosis not present

## 2014-08-11 DIAGNOSIS — R109 Unspecified abdominal pain: Secondary | ICD-10-CM | POA: Diagnosis present

## 2014-08-11 LAB — CBC
HCT: 27.7 % — ABNORMAL LOW (ref 36.0–46.0)
HEMOGLOBIN: 9.3 g/dL — AB (ref 12.0–15.0)
MCH: 32.9 pg (ref 26.0–34.0)
MCHC: 33.6 g/dL (ref 30.0–36.0)
MCV: 97.9 fL (ref 78.0–100.0)
Platelets: 94 10*3/uL — ABNORMAL LOW (ref 150–400)
RBC: 2.83 MIL/uL — AB (ref 3.87–5.11)
RDW: 17.8 % — AB (ref 11.5–15.5)
WBC: 4.5 10*3/uL (ref 4.0–10.5)

## 2014-08-11 LAB — COMPREHENSIVE METABOLIC PANEL
ALT: 29 U/L (ref 14–54)
ANION GAP: 7 (ref 5–15)
AST: 37 U/L (ref 15–41)
Albumin: 2.2 g/dL — ABNORMAL LOW (ref 3.5–5.0)
Alkaline Phosphatase: 389 U/L — ABNORMAL HIGH (ref 38–126)
BILIRUBIN TOTAL: 4.2 mg/dL — AB (ref 0.3–1.2)
BUN: 10 mg/dL (ref 6–20)
CO2: 25 mmol/L (ref 22–32)
Calcium: 7.5 mg/dL — ABNORMAL LOW (ref 8.9–10.3)
Chloride: 99 mmol/L — ABNORMAL LOW (ref 101–111)
Creatinine, Ser: 1.81 mg/dL — ABNORMAL HIGH (ref 0.44–1.00)
GFR calc Af Amer: 45 mL/min — ABNORMAL LOW (ref >60)
GFR calc non Af Amer: 38 mL/min — ABNORMAL LOW (ref >60)
Glucose, Bld: 102 mg/dL — ABNORMAL HIGH (ref 65–99)
Potassium: 2.6 mmol/L — CL (ref 3.5–5.1)
SODIUM: 131 mmol/L — AB (ref 135–145)
TOTAL PROTEIN: 5.3 g/dL — AB (ref 6.5–8.1)

## 2014-08-11 LAB — URINE MICROSCOPIC-ADD ON

## 2014-08-11 LAB — URINALYSIS, ROUTINE W REFLEX MICROSCOPIC
Glucose, UA: NEGATIVE mg/dL
Hgb urine dipstick: NEGATIVE
KETONES UR: 15 mg/dL — AB
Nitrite: NEGATIVE
PH: 5.5 (ref 5.0–8.0)
PROTEIN: NEGATIVE mg/dL
Specific Gravity, Urine: 1.021 (ref 1.005–1.030)
Urobilinogen, UA: 1 mg/dL (ref 0.0–1.0)

## 2014-08-11 LAB — POC URINE PREG, ED: Preg Test, Ur: NEGATIVE

## 2014-08-11 LAB — MAGNESIUM: Magnesium: 1.5 mg/dL — ABNORMAL LOW (ref 1.7–2.4)

## 2014-08-11 LAB — LIPASE, BLOOD: LIPASE: 50 U/L (ref 22–51)

## 2014-08-11 MED ORDER — HYDROMORPHONE HCL 1 MG/ML IJ SOLN
1.0000 mg | Freq: Once | INTRAMUSCULAR | Status: AC
  Administered 2014-08-11: 1 mg via INTRAVENOUS
  Filled 2014-08-11: qty 1

## 2014-08-11 MED ORDER — POTASSIUM CHLORIDE CRYS ER 20 MEQ PO TBCR
40.0000 meq | EXTENDED_RELEASE_TABLET | Freq: Once | ORAL | Status: AC
  Administered 2014-08-11: 40 meq via ORAL
  Filled 2014-08-11: qty 2

## 2014-08-11 MED ORDER — GI COCKTAIL ~~LOC~~
30.0000 mL | Freq: Once | ORAL | Status: AC
  Administered 2014-08-11: 30 mL via ORAL
  Filled 2014-08-11: qty 30

## 2014-08-11 MED ORDER — POTASSIUM CHLORIDE 10 MEQ/100ML IV SOLN
10.0000 meq | INTRAVENOUS | Status: DC
  Administered 2014-08-11 (×2): 10 meq via INTRAVENOUS
  Filled 2014-08-11 (×2): qty 100

## 2014-08-11 MED ORDER — MAGNESIUM SULFATE 2 GM/50ML IV SOLN
2.0000 g | Freq: Once | INTRAVENOUS | Status: AC
  Administered 2014-08-11: 2 g via INTRAVENOUS
  Filled 2014-08-11: qty 50

## 2014-08-11 NOTE — ED Provider Notes (Addendum)
CSN: [id number]     Arrival date & time 08/10/14  2339 History  This chart was scribed for Mone Commisso, MD by Abel PrestoKara Demonbreun, ED Scribe. This patient was seen in room A11C/A11C and the patient's care was started at 1:23 AM.    Chief Complaint  Patient presents with  . Abdominal Pain     Patient is a 24 y.o. female presenting with abdominal pain. The history is provided by the patient. No language interpreter was used.  Abdominal Pain Pain location:  Generalized Pain quality: aching   Pain radiates to:  Does not radiate Pain severity:  Severe Onset quality:  Sudden Duration:  2 days Timing:  Constant Progression:  Worsening Chronicity:  New Context: not trauma   Relieved by:  Nothing Worsened by:  Nothing tried Ineffective treatments:  Bowel activity Associated symptoms: no chest pain, no fever, no nausea, no shortness of breath and no vomiting   Risk factors: not pregnant    HPI Comments: Melanie Conley is a 24 y.o. 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 Pt's last BM was this morning; she reports it was loose. Pt reports ascites, she is unsure of the last time it was drained.  Pt moaning in exam room. Pt was last seen in ED for same on 08/07/14. She presented as hypokalemic at that time was placed on a brief course of oral potassium and instructed to return if presenting with new or worsening symptoms. Pt reported significant improvement in pain after treatment. She reports she has been compliant with all of her medications. Pt[facility] hepat[facility] She was last seen on 07/16/14. Pt denies nausea and vomiting.  Past Medical History  Diagnosis Date  . Liver disease   . Hepatitis   . Anemia   . Esophageal varices   . Autoimmune hepatitis   . GIB (gastrointestinal  bleeding)   . Cirrhosis   . Primary sclerosing cholangitis   . Irregular periods 08/04/2014   Past Surgical History  Procedure Laterality Date  . Gastric banding port revision    . Tonsillectomy    . Esophagogastroduodenoscopy  07/22/2011    Procedure: ESOPHAGOGASTRODUODENOSCOPY (EGD);  Surgeon: Graylin ShiverSalem F Ganem, MD;  [facility];  Service: Endoscopy;  Laterality: N/A;   Family History  Problem Relation Age of Onset  . Adopted: Yes   History  Substance Use Topics  . Smoking status: Never Smoker   . Smokeless tobacco: Never Used  . Alcohol Use: No   OB History    Gravida Para Term Preterm AB TAB SAB Ectopic Multiple Living   0 0 0 0 0 0 0 0 0 0      Review of Systems  Constitutional: Negative for fever.  Respiratory: Negative for shortness of breath.   Cardiovascular: Negative for chest pain.  Gastrointestinal: Positive for abdominal pain. Negative for nausea and vomiting.  All other systems reviewed and are negative.     Allergies  Review of patient's allergies indicates no known allergies.  Home Medications   Prior to Admission medications   Medication Sig Start Date End Date Taking? Authorizing Provider  albuterol (PROVENTIL) (2.5 MG/3ML) 0.083% nebulizer solution Take 2.5 mg by nebulization every 4 (four) hours as needed for wheezing or shortness of breath.  07/14/14   Historical Provider, MD  azaTHIOprine (IMURAN) 50  MG tablet Take 50 mg by mouth daily. 06/21/14   Historical Provider, MD  carvedilol (COREG) 3.125 MG tablet Take 3.125 mg by mouth 2 (two) times daily. 06/17/14 06/17/15  Historical Provider, MD  furosemide (LASIX) 40 MG tablet Take 1 tablet (40 mg total) by mouth daily. 06/05/14   Shanker Levora Dredge, MD  lactose free nutrition (BOOST) LIQD Take 237 mLs by mouth daily.    Historical Provider, MD  lactulose (CHRONULAC) 10 GM/15ML solution Take 30 mLs (20 g total) by mouth 2 (two) times daily. Patient taking differently: Take 20 g by mouth daily as needed  for mild constipation.  06/07/14   Shanker Levora Dredge, MD  lidocaine (XYLOCAINE) 2 % solution Use as directed 200 mg in the mouth or throat 3 (three) times daily as needed. For GI / esophageal upset 07/14/14   Historical Provider, MD  metoCLOPramide (REGLAN) 5 MG tablet Take 5 mg by mouth 2 (two) times daily. 30 minutes before a meal 07/26/14   Historical Provider, MD  omeprazole (PRILOSEC) 40 MG capsule Take 40 mg by mouth daily.    Historical Provider, MD  oxyCODONE (OXY IR/ROXICODONE) 5 MG immediate release tablet Take 5 mg by mouth every 6 (six) hours as needed for moderate pain.  06/17/14   Historical Provider, MD  Oxycodone HCl 10 MG TABS Take 10 mg by mouth every 4 (four) hours as needed (for pain).  06/23/14   Historical Provider, MD  potassium chloride (K-DUR) 10 MEQ tablet Take 1 tablet (10 mEq total) by mouth once. 08/08/14   Azalia Bilis, MD  predniSONE (DELTASONE) 5 MG tablet Take 5 mg by mouth daily.    Historical Provider, MD  Protein POWD Take 1 scoop by mouth 2 (two) times daily.    Historical Provider, MD  spironolactone (ALDACTONE) 50 MG tablet Take 100 mg by mouth daily.     Historical Provider, MD  ursodiol (ACTIGALL) 300 MG capsule Take 300 mg by mouth 2 (two) times daily.     Historical Provider, MD  valGANciclovir (VALCYTE) 450 MG tablet Take 900 mg by mouth daily. 07/08/14 10/06/14  Historical Provider, MD   BP 97/55 mmHg  Pulse 90  Temp(Src) 97.9 F (36.6 C) (Oral)  Resp 24  SpO2 99%  LMP 05/29/2014 (Approximate) Physical Exam  Constitutional: She is oriented to person, place, and time. She appears well-developed and well-nourished.  HENT:  Head: Normocephalic.  Mouth/Throat: Oropharynx is clear and moist. No oropharyngeal exudate.  Eyes: Conjunctivae and EOM are normal. Pupils are equal, round, and reactive to light.  Neck: Normal range of motion. Neck supple.  Cardiovascular: Normal rate, regular rhythm and normal heart sounds.   Pulmonary/Chest: Effort normal.   Abdominal: She exhibits distension, fluid wave and ascites. Bowel sounds are increased. There is tenderness. There is no rebound, no tenderness at McBurney's point and negative Murphy's sign. No hernia.  Hyperactive bowel sounds  Musculoskeletal: Normal range of motion.  Neurological: She is alert and oriented to person, place, and time. She has normal reflexes.  Skin: Skin is warm and dry.  Psychiatric: She has a normal mood and affect. Her behavior is normal.  Nursing note and vitals reviewed.   ED Course  Procedures (including critical care time) DIAGNOSTIC STUDIES: Oxygen Saturation is 100% on room air, normal by my interpretation.    COORDINATION OF CARE: 1:27 AM Discussed treatment plan with patient at beside, the patient agrees with the plan and has no further questions at this time.   Labs  Review Labs Reviewed  COMPREHENSIVE METABOLIC PANEL - Abnormal; Notable for the following:    Sodium 131 (*)    Potassium 2.6 (*)    Chloride 99 (*)    Glucose, Bld 102 (*)    Creatinine, Ser 1.81 (*)    Calcium 7.5 (*)    Total Protein 5.3 (*)    Albumin 2.2 (*)    Alkaline Phosphatase 389 (*)    Total Bilirubin 4.2 (*)    GFR calc non Af Amer 38 (*)    GFR calc Af Amer 45 (*)    All other components within normal limits  CBC - Abnormal; Notable for the following:    RBC 2.83 (*)    Hemoglobin 9.3 (*)    HCT 27.7 (*)    RDW 17.8 (*)    Platelets 94 (*)    All other components within normal limits  URINALYSIS, ROUTINE W REFLEX MICROSCOPIC (NOT AT [facility]) - Abnormal; Notable for the following:    Color, Urine ORANGE (*)    APPearance CLOUDY (*)    Bilirubin Urine MODERATE (*)    Ketones, ur 15 (*)    Leukocytes, UA TRACE (*)    All other components within normal limits  URINE MICROSCOPIC-ADD ON - Abnormal; Notable for the following:    Squamous Epithelial / LPF MANY (*)    Bacteria, UA MANY (*)    Casts HYALINE CASTS (*)    All other components within normal limits   LIPASE, BLOOD  MAGNESIUM  POC URINE PREG, ED    Imaging Review No results found.   EKG Interpretation   Date/Time:  Thursday August 11 2014 00:03:28 EDT Ventricular Rate:  95 PR Interval:  134 QRS Duration: 82 QT Interval:  397 QTC Calculation: 499 R Axis:   13 Text Interpretation:  Sinus rhythm Probable left ventricular hypertrophy  Prolonged QT interval Confirmed by [facility]  MD, Emilynn Srinivasan ([id number]) on  08/11/2014 12:10:51 AM       MDM   Medications  potassium chloride 10 mEq in 100 mL IVPB (10 mEq Intravenous New Bag/Given 08/11/14 0347)  magnesium sulfate IVPB 2 g 50 mL (not administered)  gi cocktail (Maalox,Lidocaine,Donnatal) (30 mLs Oral Given 08/11/14 0028)  potassium chloride SA (K-DUR,KLOR-CON) CR tablet 40 mEq (40 mEq Oral Given 08/11/14 0344)  HYDROmorphone (DILAUDID) injection 1 mg (1 mg Intravenous Given 08/11/14 0342)    Results for orders placed or performed during the hospital encounter of 08/10/14  Lipase, blood  Result Value Ref Range   Lipase 50 22 - 51 U/L  Comprehensive metabolic panel  Result Value Ref Range   Sodium 131 (L) 135 - 145 mmol/L   Potassium 2.6 (LL) 3.5 - 5.1 mmol/L   Chloride 99 (L) 101 - 111 mmol/L   CO2 25 22 - 32 mmol/L   Glucose, Bld 102 (H) 65 - 99 mg/dL   BUN 10 6 - 20 mg/dL   Creatinine, Ser 6.04 (H) 0.44 - 1.00 mg/dL   Calcium 7.5 (L) 8.9 - 10.3 mg/dL   Total Protein 5.3 (L) 6.5 - 8.1 g/dL   Albumin 2.2 (L) 3.5 - 5.0 g/dL   AST 37 15 - 41 U/L   ALT 29 14 - 54 U/L   Alkaline Phosphatase 389 (H) 38 - 126 U/L   Total Bilirubin 4.2 (H) 0.3 - 1.2 mg/dL   GFR calc non Af Amer 38 (L) >60 mL/min   GFR calc Af Amer 45 (L) >60 mL/min   Anion gap 7  5 - 15  CBC  Result Value Ref Range   WBC 4.5 4.0 - 10.5 K/uL   RBC 2.83 (L) 3.87 - 5.11 MIL/uL   Hemoglobin 9.3 (L) 12.0 - 15.0 g/dL   HCT 84.6 (L) 96.2 - 95.2 %   MCV 97.9 78.0 - 100.0 fL   MCH 32.9 26.0 - 34.0 pg   MCHC 33.6 30.0 - 36.0 g/dL   RDW 84.1 (H) 32.4 - 40.1 %    Platelets 94 (L) 150 - 400 K/uL  Urinalysis, Routine w reflex microscopic (not at [facility])  Result Value Ref Range   Color, Urine ORANGE (A) YELLOW   APPearance CLOUDY (A) CLEAR   Specific Gravity, Urine 1.021 1.005 - 1.030   pH 5.5 5.0 - 8.0   Glucose, UA NEGATIVE NEGATIVE mg/dL   Hgb urine dipstick NEGATIVE NEGATIVE   Bilirubin Urine MODERATE (A) NEGATIVE   Ketones, ur 15 (A) NEGATIVE mg/dL   Protein, ur NEGATIVE NEGATIVE mg/dL   Urobilinogen, UA 1.0 0.0 - 1.0 mg/dL   Nitrite NEGATIVE NEGATIVE   Leukocytes, UA TRACE (A) NEGATIVE  Urine microscopic-add on  Result Value Ref Range   Squamous Epithelial / LPF MANY (A) RARE   WBC, UA 0-2 <3 WBC/hpf   Bacteria, UA MANY (A) RARE   Casts HYALINE CASTS (A) NEGATIVE  Magnesium  Result Value Ref Range   Magnesium 1.5 (L) 1.7 - 2.4 mg/dL  POC urine preg, ED (not at [facility])  Result Value Ref Range   Preg Test, Ur NEGATIVE NEGATIVE   Ct Abdomen Pelvis W Contrast  08/08/2014   CLINICAL DATA:  24 year old female with abdominal pain and fullness  EXAM: CT ABDOMEN AND PELVIS WITH CONTRAST  TECHNIQUE: Multidetector CT imaging of the abdomen and pelvis was performed using the standard protocol following bolus administration of intravenous contrast.  CONTRAST:  80mL OMNIPAQUE IOHEXOL 300 MG/ML  SOLN  COMPARISON:  Ultrasound dated 06/04/2014 and MRI dated 06/04/2014 abdominal CT dated 01/12/2014  FINDINGS: The visualized lung bases are clear. No intra-abdominal free air. The large ascites.  Cirrhosis. The gallbladder, pancreas appear unremarkable. Splenomegaly measuring up to 15 cm. Multiple which a splenic hypodensities compatible with areas of infarct. The adrenal glands, kidneys, and the urinary bladder appear unremarkable. The uterus and the ovaries are grossly unremarkable.  Oral contrast opacifies the stomach and loops of small bowel and traverses into the colon without evidence of obstruction. 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 The appendix is unremarkable.  The the abdominal aorta and IVC are patent. There is a sacral aortic left renal vein. The main portal vein is diminutive but remains patent. No portal venous gas identified. No lymphadenopathy. Perisplenic, perigastric, and paresthesia wall varices noted. There is recanalization of the umbilical vein compatible with portal hypertension. The osseous structures are unremarkable.  IMPRESSION: Cirrhosis with portal hypertension, splenomegaly and upper abdominal varices.  Multiple splenic infarcts.  Large ascites.  Diffuse thickening of the jejunal folds and colon most compatible with hepatic enteropathy. No bowel obstruction.   Electronically Signed   By: Elgie Collard M.D.   On: 08/08/2014 02:50   US Paracentesis  07/30/2014   INDICATION: Patient with history of cirrhosis, autoimmune hepatitis with PSC overlap syndrome, recurrent ascites, abdominal pain. Request is made for diagnostic and therapeutic paracentesis.  EXAM: ULTRASOUND-GUIDED DIAGNOSTIC AND THERAPEUTIC PARACENTESIS  COMPARISON:  Prior paracentesis on 06/04/2014  MEDICATIONS: None.  COMPLICATIONS: None immediate  TECHNIQUE: Informed written consent was obtained from the patient after a discussion of the risks, benefits and alternatives to treatment. A timeout was performed prior to the initiation of the procedure.  Initial ultrasound scanning demonstrates a small to moderate amount of ascites within the right lower abdominal quadrant. The right lower abdomen was prepped and draped in the usual sterile fashion. 1% lidocaine was used for local anesthesia. Under direct ultrasound guidance, a 19 gauge, 7-cm, Yueh catheter was introduced. An ultrasound image was saved for documentation purposed. The paracentesis was performed. The catheter was removed and a dressing was applied. The patient tolerated the procedure well without immediate post procedural  complication.  FINDINGS: A total of approximately 2.5 liters of clear, yellow fluid was removed. A portion of the fluid was submitted to the lab for preordered studies.  IMPRESSION: Successful ultrasound-guided diagnostic and therapeutic paracentesis yielding 2.5 liters of peritoneal fluid.  Read by: Jeananne Rama, PA-C   Electronically Signed   By: Corlis Leak M.D.   On: 07/30/2014 14:24   Dg Abd Acute W/chest  08/11/2014   CLINICAL DATA:  Abdominal pain onset 2 days ago. History of ascites. History of primary sclerosing cholangitis on transplant list.  EXAM: DG ABDOMEN ACUTE W/ 1V CHEST  COMPARISON:  CT abdomen and pelvis 08/08/2014  FINDINGS: Shallow inspiration with linear atelectasis in the lung bases. Normal heart size and pulmonary vascularity. No focal airspace disease or consolidation in the lungs.  Normal bowel gas pattern with scattered gas and stool in the colon. No free intra-abdominal air. No abnormal air-fluid levels. Diffusely increased density throughout the abdomen consistent with history of ascites. No radiopaque stones. Bones appear intact.  IMPRESSION: Shallow inspiration with linear atelectasis in the lung bases. Abdominal appearance consistent with ascites. Nonobstructive bowel gas pattern.   Electronically Signed   By: Burman Nieves M.D.   On: 08/11/2014 03:50   Dg Abd Acute W/chest  07/30/2014   CLINICAL DATA:  Initial evaluation for 1 week history of abdominal pain.  EXAM: DG ABDOMEN ACUTE W/ 1V CHEST  COMPARISON:  Prior study from 06/04/2014  FINDINGS: The cardiac and mediastinal silhouettes are stable in size and contour, and remain within normal limits.  The lungs are normally inflated. No airspace consolidation, pleural effusion, or pulmonary edema is identified. There is no pneumothorax.  Bowel gas pattern within normal limits without evidence of obstruction or ileus. No abnormal bowel wall thickening. Moderate amount of retained stool within the colon. No free air. No soft  tissue mass or abnormal calcification.  No acute osseus abnormality.  IMPRESSION: 1. Nonobstructive bowel gas pattern with no radiographic evidence for acute intra-abdominal process. 2. No active cardiopulmonary disease.   Electronically Signed   By: Rise Mu M.D.   On: 07/30/2014 00:32    328 am case d/w Dr. Piedad Climes at [facility], she is concerned about hyponatremia and renal function they would be happy to care for her but she believes they are on divert.  Please call transfer center   438 am case d/w Dr. Noralee Stain ED physician at [facility].  [facility] has no availability at this time   Dr. Clyde Lundborg reportedly spoke to Dr. Noralee Stain who will now accept  EDP spoke with Dr. Noralee Stain who will accept to the ED.    I personally performed the services described in this documentation, which was scribed in my presence. The recorded information has been reviewed and is accurate.     Cy Blamer, MD 08/11/14 1610  Cy Blamer, MD  08/11/14 0610 

## 2014-08-11 NOTE — ED Notes (Signed)
Lab reported K+ 2.6, reported to Dr Nicanor AlconPalumbo

## 2014-08-11 NOTE — ED Notes (Signed)
Reported pain in abdomen to Dr. Nicanor AlconPalumbo. MD acknowledges, gives verbal order for GI cocktail.

## 2014-08-11 NOTE — ED Notes (Signed)
Paged [facility]

## 2014-08-11 NOTE — ED Notes (Signed)
carelink present for transport. Report provided by [employer]ricket, RN.

## 2014-08-31 ENCOUNTER — Ambulatory Visit (INDEPENDENT_AMBULATORY_CARE_PROVIDER_SITE_OTHER): Payer: BC Managed Care – PPO | Admitting: Obstetrics and Gynecology

## 2014-08-31 ENCOUNTER — Other Ambulatory Visit: Payer: Self-pay | Admitting: Obstetrics and Gynecology

## 2014-08-31 ENCOUNTER — Encounter: Payer: Self-pay | Admitting: Obstetrics and Gynecology

## 2014-08-31 VITALS — BP 90/50 | Ht 59.0 in | Wt 99.0 lb

## 2014-08-31 DIAGNOSIS — Z3202 Encounter for pregnancy test, result negative: Secondary | ICD-10-CM | POA: Diagnosis not present

## 2014-08-31 DIAGNOSIS — Z3200 Encounter for pregnancy test, result unknown: Secondary | ICD-10-CM

## 2014-08-31 DIAGNOSIS — R87612 Low grade squamous intraepithelial lesion on cytologic smear of cervix (LGSIL): Secondary | ICD-10-CM | POA: Diagnosis not present

## 2014-08-31 LAB — POCT URINE PREGNANCY: Preg Test, Ur: NEGATIVE

## 2014-08-31 NOTE — Progress Notes (Signed)
Patient ID: Melanie Conley, female   DOB: 1990-03-22, 24 y.o.   MRN: [id number] Pt here today for colposcopy. Pt had a LSIL result on a recent pap.

## 2014-08-31 NOTE — Progress Notes (Addendum)
Patient ID: Melanie Conley, female   DOB: Jan 25, 1991, 24 y.o.   MRN: [id number]  . Melanie Conley 24 y.o. [id number] here for colposcopy for low-grade squamous intraepithelial neoplasia (LGSIL - encompassing HPV,mild dysplasia,CIN I) pap smear on 7/7.  Discussed role for HPV in cervical dysplasia, need for surveillance.  Patient given informed consent, signed copy in the chart, time out was performed.  Placed in lithotomy position. Cervix viewed with speculum and colposcope after application of acetic acid.   Colposcopy adequate? Yes  acetowhite lesion(s) noted at 7-11 o'clock and 3-4 o'clock; biopsies obtained at 9 o'clock.   ECC specimen obtained. All specimens were labelled and sent to pathology.  Colposcopy IMPRESSION: CIN I  Patient was given post procedure instructions. Will follow up pathology and manage accordingly.  Routine preventative health maintenance measures emphasized.  F/u path by appt Discuss contraception at f/u This chart was scribed for Tilda Burrow, MD by Gwenyth Ober, Medical Scribe. This patient was seen in room 2 and the patient's care was started at 11:57 AM.   I personally performed the services described in this documentation, which was SCRIBED in my presence. The recorded information has been reviewed and considered accurate. It has been edited as necessary during review. Tilda Burrow, MD    In addition to the colposcopy , we discussed pt contraception issues.  Pt currently not on anything , not desiring current pregnancy , and not asking partner for help.  Options discussed, given info brochures, and will discuss further at f/u. LOS 3 added due to contr management.

## 2014-09-04 ENCOUNTER — Inpatient Hospital Stay (HOSPITAL_COMMUNITY)
Admission: EM | Admit: 2014-09-04 | Discharge: 2014-09-07 | DRG: 391 | Disposition: A | Discharge status: Home / Self Care | Payer: BC Managed Care – PPO | Attending: Internal Medicine | Admitting: Internal Medicine

## 2014-09-04 ENCOUNTER — Encounter (HOSPITAL_COMMUNITY): Payer: Self-pay

## 2014-09-04 DIAGNOSIS — K7290 Hepatic failure, unspecified without coma: Secondary | ICD-10-CM | POA: Diagnosis present

## 2014-09-04 DIAGNOSIS — R188 Other ascites: Secondary | ICD-10-CM

## 2014-09-04 DIAGNOSIS — K529 Noninfective gastroenteritis and colitis, unspecified: Principal | ICD-10-CM | POA: Diagnosis present

## 2014-09-04 DIAGNOSIS — D62 Acute posthemorrhagic anemia: Secondary | ICD-10-CM | POA: Diagnosis present

## 2014-09-04 DIAGNOSIS — E8809 Other disorders of plasma-protein metabolism, not elsewhere classified: Secondary | ICD-10-CM | POA: Diagnosis present

## 2014-09-04 DIAGNOSIS — K8301 Primary sclerosing cholangitis: Secondary | ICD-10-CM

## 2014-09-04 DIAGNOSIS — K7682 Hepatic encephalopathy: Secondary | ICD-10-CM | POA: Diagnosis present

## 2014-09-04 DIAGNOSIS — R109 Unspecified abdominal pain: Secondary | ICD-10-CM

## 2014-09-04 DIAGNOSIS — Z7682 Awaiting organ transplant status: Secondary | ICD-10-CM

## 2014-09-04 DIAGNOSIS — Z79899 Other long term (current) drug therapy: Secondary | ICD-10-CM

## 2014-09-04 DIAGNOSIS — I8500 Esophageal varices without bleeding: Secondary | ICD-10-CM | POA: Diagnosis present

## 2014-09-04 DIAGNOSIS — D696 Thrombocytopenia, unspecified: Secondary | ICD-10-CM | POA: Diagnosis present

## 2014-09-04 DIAGNOSIS — K830 Cholangitis: Secondary | ICD-10-CM | POA: Diagnosis present

## 2014-09-04 DIAGNOSIS — K7460 Unspecified cirrhosis of liver: Secondary | ICD-10-CM | POA: Diagnosis present

## 2014-09-04 DIAGNOSIS — Z7952 Long term (current) use of systemic steroids: Secondary | ICD-10-CM

## 2014-09-04 DIAGNOSIS — K754 Autoimmune hepatitis: Secondary | ICD-10-CM | POA: Diagnosis present

## 2014-09-04 DIAGNOSIS — J45909 Unspecified asthma, uncomplicated: Secondary | ICD-10-CM | POA: Diagnosis present

## 2014-09-04 DIAGNOSIS — E43 Unspecified severe protein-calorie malnutrition: Secondary | ICD-10-CM | POA: Diagnosis present

## 2014-09-04 DIAGNOSIS — Z6822 Body mass index (BMI) 22.0-22.9, adult: Secondary | ICD-10-CM

## 2014-09-04 DIAGNOSIS — R17 Unspecified jaundice: Secondary | ICD-10-CM | POA: Diagnosis present

## 2014-09-04 DIAGNOSIS — E876 Hypokalemia: Secondary | ICD-10-CM | POA: Diagnosis present

## 2014-09-04 DIAGNOSIS — K922 Gastrointestinal hemorrhage, unspecified: Secondary | ICD-10-CM | POA: Diagnosis present

## 2014-09-04 LAB — COMPREHENSIVE METABOLIC PANEL
ALK PHOS: 465 U/L — AB (ref 38–126)
ALT: 41 U/L (ref 14–54)
ANION GAP: 9 (ref 5–15)
AST: 57 U/L — AB (ref 15–41)
Albumin: 2.8 g/dL — ABNORMAL LOW (ref 3.5–5.0)
BUN: 12 mg/dL (ref 6–20)
CHLORIDE: 97 mmol/L — AB (ref 101–111)
CO2: 29 mmol/L (ref 22–32)
CREATININE: 0.66 mg/dL (ref 0.44–1.00)
Calcium: 8.6 mg/dL — ABNORMAL LOW (ref 8.9–10.3)
Glucose, Bld: 107 mg/dL — ABNORMAL HIGH (ref 65–99)
POTASSIUM: 2.5 mmol/L — AB (ref 3.5–5.1)
Sodium: 135 mmol/L (ref 135–145)
Total Bilirubin: 7.4 mg/dL — ABNORMAL HIGH (ref 0.3–1.2)
Total Protein: 6.2 g/dL — ABNORMAL LOW (ref 6.5–8.1)

## 2014-09-04 LAB — CBC
HCT: 24.8 % — ABNORMAL LOW (ref 36.0–46.0)
HEMOGLOBIN: 8 g/dL — AB (ref 12.0–15.0)
MCH: 31.3 pg (ref 26.0–34.0)
MCHC: 32.3 g/dL (ref 30.0–36.0)
MCV: 96.9 fL (ref 78.0–100.0)
Platelets: 145 10*3/uL — ABNORMAL LOW (ref 150–400)
RBC: 2.56 MIL/uL — ABNORMAL LOW (ref 3.87–5.11)
RDW: 17.7 % — ABNORMAL HIGH (ref 11.5–15.5)
WBC: 8.8 10*3/uL (ref 4.0–10.5)

## 2014-09-04 LAB — I-STAT BETA HCG BLOOD, ED (MC, WL, AP ONLY)

## 2014-09-04 LAB — LIPASE, BLOOD: LIPASE: 64 U/L — AB (ref 22–51)

## 2014-09-04 MED ORDER — ONDANSETRON HCL 4 MG/2ML IJ SOLN
4.0000 mg | Freq: Once | INTRAMUSCULAR | Status: AC
  Administered 2014-09-04: 4 mg via INTRAVENOUS
  Filled 2014-09-04: qty 2

## 2014-09-04 MED ORDER — HYDROMORPHONE HCL 1 MG/ML IJ SOLN
1.0000 mg | Freq: Once | INTRAMUSCULAR | Status: AC
  Administered 2014-09-04: 1 mg via INTRAVENOUS
  Filled 2014-09-04: qty 1

## 2014-09-04 NOTE — ED Notes (Signed)
To triage via EMS.  Pt woke up with abd pain around umbilicus today.  Took Lidocaine 15ml oral solution and vomited.  Per EMS pt started moaning with pain upon arrival into ED.  Hard to obtain information from pt as she keeps saying "I can't do it" when asked what that means she reports she can't move.

## 2014-09-04 NOTE — ED Provider Notes (Signed)
CSN: [id number]     Arrival date & time 09/04/14  2027 History  This chart was scribed for Geoffery Lyons, MD by Evon Slack, ED Scribe. This patient was seen in room A02C/A02C and the patient's care was started at 10:58 PM.     Chief Complaint  Patient presents with  . Abdominal Pain   Patient is a 24 y.o. female presenting with abdominal pain. The history is provided by the patient. No language interpreter was used.  Abdominal Pain Pain location:  Generalized Pain radiates to:  Does not radiate Pain severity:  Moderate Onset quality:  Gradual Duration:  1 day Timing:  Constant Progression:  Unchanged Chronicity:  Chronic  HPI Comments: Melanie Conley is a 24 y.o. female with PMHx of liver disease, hepatitis, and cirrhosis brought in by ambulance, who presents to the Emergency Department complaining of constant sharp abdominal pain onset this morning. She reports fever and vomiting x1. Pt is poor historian refusing to answer questions.    Past Medical History  Diagnosis Date  . Liver disease   . Hepatitis   . Anemia   . Esophageal varices   . Autoimmune hepatitis   . GIB (gastrointestinal bleeding)   . Cirrhosis   . Primary sclerosing cholangitis   . Irregular periods 08/04/2014  . Vaginal Pap smear, abnormal    Past Surgical History  Procedure Laterality Date  . Gastric banding port revision    . Tonsillectomy    . Esophagogastroduodenoscopy  07/22/2011    Procedure: ESOPHAGOGASTRODUODENOSCOPY (EGD);  Surgeon: Graylin Shiver, MD;  Location: [facility] ENDOSCOPY;  Service: Endoscopy;  Laterality: N/A;   Family History  Problem Relation Age of Onset  . Adopted: Yes   History  Substance Use Topics  . Smoking status: Never Smoker   . Smokeless tobacco: Never Used  . Alcohol Use: No   OB History    Gravida Para Term Preterm AB TAB SAB Ectopic Multiple Living   0 0 0 0 0 0 0 0 0 0       Review of Systems  Gastrointestinal: Positive for abdominal pain.  All other systems  reviewed and are negative.    Allergies  Review of patient's allergies indicates no known allergies.  Home Medications   Prior to Admission medications   Medication Sig Start Date End Date Taking? Authorizing Provider  albuterol (PROVENTIL) (2.5 MG/3ML) 0.083% nebulizer solution Take 2.5 mg by nebulization every 4 (four) hours as needed for wheezing or shortness of breath.  07/14/14   Historical Provider, MD  azaTHIOprine (IMURAN) 50 MG tablet Take 50 mg by mouth daily. 06/21/14   Historical Provider, MD  carvedilol (COREG) 3.125 MG tablet Take 3.125 mg by mouth 2 (two) times daily. 06/17/14 06/17/15  Historical Provider, MD  furosemide (LASIX) 40 MG tablet Take 1 tablet (40 mg total) by mouth daily. 06/05/14   Shanker Levora Dredge, MD  lactose free nutrition (BOOST) LIQD Take 237 mLs by mouth daily.    Historical Provider, MD  lactulose (CHRONULAC) 10 GM/15ML solution Take 30 mLs (20 g total) by mouth 2 (two) times daily. Patient taking differently: Take 20 g by mouth daily as needed for mild constipation.  06/07/14   Shanker Levora Dredge, MD  lidocaine (XYLOCAINE) 2 % solution Use as directed 200 mg in the mouth or throat 3 (three) times daily as needed. For GI / esophageal upset 07/14/14   Historical Provider, MD  metoCLOPramide (REGLAN) 5 MG tablet Take 5 mg by mouth 2 (two) times  daily. 30 minutes before a meal 07/26/14   Historical Provider, MD  omeprazole (PRILOSEC) 40 MG capsule Take 40 mg by mouth daily.    Historical Provider, MD  potassium chloride (K-DUR) 10 MEQ tablet Take 1 tablet (10 mEq total) by mouth once. 08/08/14   Azalia Bilis, MD  predniSONE (DELTASONE) 5 MG tablet Take 5 mg by mouth daily.    Historical Provider, MD  Protein POWD Take 1 scoop by mouth 2 (two) times daily.    Historical Provider, MD  spironolactone (ALDACTONE) 50 MG tablet Take 100 mg by mouth daily.     Historical Provider, MD  ursodiol (ACTIGALL) 300 MG capsule Take 300 mg by mouth 2 (two) times daily.     Historical  Provider, MD  valGANciclovir (VALCYTE) 450 MG tablet Take 900 mg by mouth daily. 07/08/14 10/06/14  Historical Provider, MD   BP 120/62 mmHg  Pulse 114  Temp(Src) 98.3 F (36.8 C) (Oral)  Resp 13  Ht 5' (1.524 m)  Wt 99 lb (44.906 kg)  BMI 19.33 kg/m2  SpO2 94%   Physical Exam  Constitutional: She is oriented to person, place, and time. She appears well-developed and well-nourished. No distress.  Pt is thin and appears chronically ill. She is moaning and appears uncomfortable.   HENT:  Head: Normocephalic and atraumatic.  Eyes: Conjunctivae and EOM are normal.  Neck: Neck supple. No tracheal deviation present.  Cardiovascular: Normal rate.   Pulmonary/Chest: Effort normal. No respiratory distress.  Abdominal: There is tenderness.  TTP in all 4 quadrants.   Musculoskeletal: Normal range of motion.  Neurological: She is alert and oriented to person, place, and time.  Skin: Skin is warm and dry.  Psychiatric: She has a normal mood and affect. Her behavior is normal.  Nursing note and vitals reviewed.   ED Course  Procedures (including critical care time) DIAGNOSTIC STUDIES: Oxygen Saturation is 97% on RA, normal by my interpretation.    COORDINATION OF CARE:  Labs Review Labs Reviewed  LIPASE, BLOOD - Abnormal; Notable for the following:    Lipase 64 (*)    All other components within normal limits  COMPREHENSIVE METABOLIC PANEL - Abnormal; Notable for the following:    Potassium 2.5 (*)    Chloride 97 (*)    Glucose, Bld 107 (*)    Calcium 8.6 (*)    Total Protein 6.2 (*)    Albumin 2.8 (*)    AST 57 (*)    Alkaline Phosphatase 465 (*)    Total Bilirubin 7.4 (*)    All other components within normal limits  CBC - Abnormal; Notable for the following:    RBC 2.56 (*)    Hemoglobin 8.0 (*)    HCT 24.8 (*)    RDW 17.7 (*)    Platelets 145 (*)    All other components within normal limits  URINALYSIS, ROUTINE W REFLEX MICROSCOPIC (NOT AT [facility]) - Abnormal; Notable for  the following:    Color, Urine AMBER (*)    Bilirubin Urine SMALL (*)    Urobilinogen, UA 2.0 (*)    Leukocytes, UA TRACE (*)    All other components within normal limits  URINE MICROSCOPIC-ADD ON  I-STAT BETA HCG BLOOD, ED ([facility], [facility], [facility] ONLY)  POC URINE PREG, ED    Imaging Review Ct Abdomen Pelvis W Contrast  09/05/2014   CLINICAL DATA:  Acute onset of periumbilical abdominal pain. Initial encounter.  EXAM: CT ABDOMEN AND PELVIS WITH CONTRAST  TECHNIQUE: Multidetector CT  imaging of the abdomen and pelvis was performed using the standard protocol following bolus administration of intravenous contrast.  CONTRAST:  75mL OMNIPAQUE IOHEXOL 300 MG/ML  SOLN  COMPARISON:  CT of the abdomen and pelvis performed 08/08/2014  FINDINGS: The visualized lung bases are clear.  Large volume ascites is again noted within the abdomen and pelvis, perhaps slightly decreased from the prior study.  Underlying changes of hepatic cirrhosis are again noted, with slight nodularity of the hepatic contour. 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 Splenomegaly is again noted. The spleen measures 16.3 cm in length. Scattered splenic, gastric and esophageal varices are again noted. The portal venous system appears grossly patent.  The pancreas and adrenal glands are grossly unremarkable in appearance. The kidneys are unremarkable in appearance. No perinephric stranding is appreciated. No renal or ureteral stones are identified. There is no evidence of hydronephrosis.  No free fluid is identified. The small bowel is unremarkable in appearance. The stomach is within normal limits. No acute vascular abnormalities are seen.  The appendix is normal in caliber and contains air, without evidence of appendicitis. There is new mucosal edema involving the entirety of the colon. Though this may reflect hypoproteinemia or other systemic condition, mild infectious or inflammatory colitis cannot be excluded.  The bladder is mildly distended and  grossly unremarkable. Scattered calcification is noted along a 1.4 cm urachal nodule. The uterus is unremarkable in appearance. The ovaries are relatively symmetric. No suspicious adnexal masses are seen. No inguinal lymphadenopathy is seen.  No acute osseous abnormalities are identified.  IMPRESSION: 1. New mucosal edema involving the entirety of the colon. Though this may reflect hypoproteinemia or other systemic condition, mild infectious or inflammatory colitis cannot be excluded. 2. Large volume ascites again noted within the abdomen and pelvis, perhaps slightly decreased from the prior study. 3. Underlying changes of hepatic cirrhosis again noted, with splenomegaly. Multiple splenic infarcts again seen. Scattered splenic, gastric and esophageal varices again noted. 4. 1.4 cm urachal nodule, with scattered constipation, appears grossly stable from prior studies.   Electronically Signed   By: Roanna Raider M.D.   On: 09/05/2014 01:57     EKG Interpretation None      MDM   Final diagnoses:  Abdominal pain      Patient is a 24 year old female with history of primary sclerosing cholangitis who presents with complaints of abdominal pain. Her answers to questions are somewhat erratic and occasionally inappropriate. Workup reveals an elevated ammonia level which may well account for this. She also has increased LFTs and decreased hemoglobin. While in the emergency department she experienced a bowel movement which was dark and heme positive.  CT scan reveals what appears to be a pan colitis the cause of which I am uncertain. I have discussed these findings with Dr. Allena Katz from the hospitalist service who will admit for further workup and treatment. Her pain was treated and she appears to be resting comfortably at this time.   I personally performed the services described in this documentation, which was scribed in my presence. The recorded information has been reviewed and is  accurate.      Geoffery Lyons, MD 09/05/14 [phone]

## 2014-09-05 ENCOUNTER — Emergency Department (HOSPITAL_COMMUNITY): Payer: BC Managed Care – PPO

## 2014-09-05 ENCOUNTER — Inpatient Hospital Stay (HOSPITAL_COMMUNITY): Payer: BC Managed Care – PPO

## 2014-09-05 DIAGNOSIS — R188 Other ascites: Secondary | ICD-10-CM | POA: Diagnosis present

## 2014-09-05 DIAGNOSIS — D62 Acute posthemorrhagic anemia: Secondary | ICD-10-CM | POA: Diagnosis present

## 2014-09-05 DIAGNOSIS — R17 Unspecified jaundice: Secondary | ICD-10-CM

## 2014-09-05 DIAGNOSIS — Z79899 Other long term (current) drug therapy: Secondary | ICD-10-CM | POA: Diagnosis not present

## 2014-09-05 DIAGNOSIS — K830 Cholangitis: Secondary | ICD-10-CM | POA: Diagnosis present

## 2014-09-05 DIAGNOSIS — E43 Unspecified severe protein-calorie malnutrition: Secondary | ICD-10-CM

## 2014-09-05 DIAGNOSIS — K7460 Unspecified cirrhosis of liver: Secondary | ICD-10-CM | POA: Diagnosis present

## 2014-09-05 DIAGNOSIS — Z7952 Long term (current) use of systemic steroids: Secondary | ICD-10-CM | POA: Diagnosis not present

## 2014-09-05 DIAGNOSIS — K2971 Gastritis, unspecified, with bleeding: Secondary | ICD-10-CM

## 2014-09-05 DIAGNOSIS — K529 Noninfective gastroenteritis and colitis, unspecified: Principal | ICD-10-CM

## 2014-09-05 DIAGNOSIS — K754 Autoimmune hepatitis: Secondary | ICD-10-CM | POA: Diagnosis present

## 2014-09-05 DIAGNOSIS — D696 Thrombocytopenia, unspecified: Secondary | ICD-10-CM | POA: Diagnosis present

## 2014-09-05 DIAGNOSIS — E876 Hypokalemia: Secondary | ICD-10-CM | POA: Diagnosis present

## 2014-09-05 DIAGNOSIS — I8500 Esophageal varices without bleeding: Secondary | ICD-10-CM | POA: Diagnosis present

## 2014-09-05 DIAGNOSIS — K2961 Other gastritis with bleeding: Secondary | ICD-10-CM | POA: Diagnosis not present

## 2014-09-05 DIAGNOSIS — E8809 Other disorders of plasma-protein metabolism, not elsewhere classified: Secondary | ICD-10-CM | POA: Diagnosis present

## 2014-09-05 DIAGNOSIS — J45909 Unspecified asthma, uncomplicated: Secondary | ICD-10-CM | POA: Diagnosis present

## 2014-09-05 DIAGNOSIS — K7290 Hepatic failure, unspecified without coma: Secondary | ICD-10-CM

## 2014-09-05 DIAGNOSIS — K7682 Hepatic encephalopathy: Secondary | ICD-10-CM | POA: Diagnosis present

## 2014-09-05 DIAGNOSIS — Z6822 Body mass index (BMI) 22.0-22.9, adult: Secondary | ICD-10-CM | POA: Diagnosis not present

## 2014-09-05 DIAGNOSIS — Z7682 Awaiting organ transplant status: Secondary | ICD-10-CM | POA: Diagnosis not present

## 2014-09-05 LAB — URINE MICROSCOPIC-ADD ON

## 2014-09-05 LAB — COMPREHENSIVE METABOLIC PANEL
ALT: 34 U/L (ref 14–54)
AST: 42 U/L — AB (ref 15–41)
Albumin: 2.4 g/dL — ABNORMAL LOW (ref 3.5–5.0)
Alkaline Phosphatase: 381 U/L — ABNORMAL HIGH (ref 38–126)
Anion gap: 8 (ref 5–15)
BUN: 11 mg/dL (ref 6–20)
CALCIUM: 8.1 mg/dL — AB (ref 8.9–10.3)
CO2: 29 mmol/L (ref 22–32)
CREATININE: 0.64 mg/dL (ref 0.44–1.00)
Chloride: 98 mmol/L — ABNORMAL LOW (ref 101–111)
GFR calc Af Amer: >60 mL/min (ref >60)
GFR calc non Af Amer: >60 mL/min (ref >60)
GLUCOSE: 110 mg/dL — AB (ref 65–99)
Potassium: 3 mmol/L — ABNORMAL LOW (ref 3.5–5.1)
SODIUM: 135 mmol/L (ref 135–145)
Total Bilirubin: 5.7 mg/dL — ABNORMAL HIGH (ref 0.3–1.2)
Total Protein: 5.2 g/dL — ABNORMAL LOW (ref 6.5–8.1)

## 2014-09-05 LAB — AMMONIA: Ammonia: 128 umol/L — ABNORMAL HIGH (ref 9–35)

## 2014-09-05 LAB — BODY FLUID CELL COUNT WITH DIFFERENTIAL
Eos, Fluid: 0 %
Lymphs, Fluid: 11 %
MONOCYTE-MACROPHAGE-SEROUS FLUID: 86 % (ref 50–90)
Neutrophil Count, Fluid: 3 % (ref 0–25)
Total Nucleated Cell Count, Fluid: 112 cu mm (ref 0–1000)

## 2014-09-05 LAB — LACTIC ACID, PLASMA: Lactic Acid, Venous: 1.9 mmol/L (ref 0.5–2.0)

## 2014-09-05 LAB — RETICULOCYTES
RBC.: 2.07 MIL/uL — ABNORMAL LOW (ref 3.87–5.11)
RETIC COUNT ABSOLUTE: 163.5 10*3/uL (ref 19.0–186.0)
Retic Ct Pct: 7.9 % — ABNORMAL HIGH (ref 0.4–3.1)

## 2014-09-05 LAB — CBC WITH DIFFERENTIAL/PLATELET
BASOS ABS: 0 10*3/uL (ref 0.0–0.1)
Basophils Relative: 0 % (ref 0–1)
EOS PCT: 0 % (ref 0–5)
Eosinophils Absolute: 0 10*3/uL (ref 0.0–0.7)
HCT: 20.5 % — ABNORMAL LOW (ref 36.0–46.0)
Hemoglobin: 6.5 g/dL — CL (ref 12.0–15.0)
LYMPHS ABS: 0.8 10*3/uL (ref 0.7–4.0)
Lymphocytes Relative: 16 % (ref 12–46)
MCH: 31.4 pg (ref 26.0–34.0)
MCHC: 31.7 g/dL (ref 30.0–36.0)
MCV: 99 fL (ref 78.0–100.0)
MONO ABS: 0.7 10*3/uL (ref 0.1–1.0)
MONOS PCT: 15 % — AB (ref 3–12)
Neutro Abs: 3.4 10*3/uL (ref 1.7–7.7)
Neutrophils Relative %: 69 % (ref 43–77)
PLATELETS: 91 10*3/uL — AB (ref 150–400)
RBC: 2.07 MIL/uL — AB (ref 3.87–5.11)
RDW: 18.1 % — ABNORMAL HIGH (ref 11.5–15.5)
WBC: 5 10*3/uL (ref 4.0–10.5)

## 2014-09-05 LAB — ALBUMIN, FLUID (OTHER): Albumin, Fluid: <1 g/dL

## 2014-09-05 LAB — LACTATE DEHYDROGENASE, PLEURAL OR PERITONEAL FLUID: LD, Fluid: 20 U/L (ref 3–23)

## 2014-09-05 LAB — PREPARE RBC (CROSSMATCH)

## 2014-09-05 LAB — GRAM STAIN

## 2014-09-05 LAB — POC URINE PREG, ED: Preg Test, Ur: NEGATIVE

## 2014-09-05 LAB — URINALYSIS, ROUTINE W REFLEX MICROSCOPIC
GLUCOSE, UA: NEGATIVE mg/dL
HGB URINE DIPSTICK: NEGATIVE
Ketones, ur: NEGATIVE mg/dL
NITRITE: NEGATIVE
PH: 7 (ref 5.0–8.0)
PROTEIN: NEGATIVE mg/dL
SPECIFIC GRAVITY, URINE: 1.018 (ref 1.005–1.030)
Urobilinogen, UA: 2 mg/dL — ABNORMAL HIGH (ref 0.0–1.0)

## 2014-09-05 LAB — POC OCCULT BLOOD, ED: Fecal Occult Bld: POSITIVE — AB

## 2014-09-05 LAB — MRSA PCR SCREENING: MRSA by PCR: NEGATIVE

## 2014-09-05 LAB — PROTEIN, BODY FLUID

## 2014-09-05 LAB — MAGNESIUM: Magnesium: 1.6 mg/dL — ABNORMAL LOW (ref 1.7–2.4)

## 2014-09-05 LAB — PROTIME-INR
INR: 1.69 — ABNORMAL HIGH (ref 0.00–1.49)
Prothrombin Time: 19.9 seconds — ABNORMAL HIGH (ref 11.6–15.2)

## 2014-09-05 LAB — TSH: TSH: 0.524 u[IU]/mL (ref 0.350–4.500)

## 2014-09-05 LAB — PHOSPHORUS: PHOSPHORUS: 2.8 mg/dL (ref 2.5–4.6)

## 2014-09-05 MED ORDER — DEXTROSE 5 % IV SOLN
1.0000 g | INTRAVENOUS | Status: DC
  Administered 2014-09-05 – 2014-09-07 (×3): 1 g via INTRAVENOUS
  Filled 2014-09-05 (×4): qty 10

## 2014-09-05 MED ORDER — SODIUM CHLORIDE 0.9 % IJ SOLN
3.0000 mL | Freq: Two times a day (BID) | INTRAMUSCULAR | Status: DC
  Administered 2014-09-05 – 2014-09-06 (×3): 3 mL via INTRAVENOUS

## 2014-09-05 MED ORDER — FUROSEMIDE 10 MG/ML IJ SOLN
20.0000 mg | Freq: Once | INTRAMUSCULAR | Status: AC
  Administered 2014-09-05: 20 mg via INTRAVENOUS
  Filled 2014-09-05: qty 2

## 2014-09-05 MED ORDER — IOHEXOL 300 MG/ML  SOLN
75.0000 mL | Freq: Once | INTRAMUSCULAR | Status: AC | PRN
  Administered 2014-09-05: 75 mL via INTRAVENOUS

## 2014-09-05 MED ORDER — LACTULOSE 10 GM/15ML PO SOLN
30.0000 g | Freq: Three times a day (TID) | ORAL | Status: DC
Start: 2014-09-05 — End: 2014-09-07
  Administered 2014-09-05 – 2014-09-07 (×5): 30 g via ORAL
  Filled 2014-09-05 (×9): qty 45

## 2014-09-05 MED ORDER — SODIUM CHLORIDE 0.9 % IV SOLN: Freq: Once | INTRAVENOUS | Status: DC

## 2014-09-05 MED ORDER — POTASSIUM CHLORIDE 10 MEQ/100ML IV SOLN
10.0000 meq | INTRAVENOUS | Status: AC
  Administered 2014-09-05 (×2): 10 meq via INTRAVENOUS
  Filled 2014-09-05: qty 100

## 2014-09-05 MED ORDER — PANTOPRAZOLE SODIUM 40 MG IV SOLR: 40.0000 mg | Freq: Two times a day (BID) | INTRAVENOUS | Status: DC

## 2014-09-05 MED ORDER — ONDANSETRON HCL 4 MG/2ML IJ SOLN
4.0000 mg | Freq: Four times a day (QID) | INTRAMUSCULAR | Status: DC | PRN
  Administered 2014-09-06: 4 mg via INTRAVENOUS
  Filled 2014-09-05: qty 2

## 2014-09-05 MED ORDER — VALGANCICLOVIR HCL 450 MG PO TABS
900.0000 mg | ORAL_TABLET | Freq: Every day | ORAL | Status: DC
  Administered 2014-09-05 – 2014-09-07 (×3): 900 mg via ORAL
  Filled 2014-09-05 (×3): qty 2

## 2014-09-05 MED ORDER — ONDANSETRON HCL 4 MG PO TABS: 4.0000 mg | ORAL_TABLET | Freq: Four times a day (QID) | ORAL | Status: DC | PRN

## 2014-09-05 MED ORDER — SODIUM CHLORIDE 0.9 % IV SOLN
8.0000 mg/h | INTRAVENOUS | Status: DC
  Administered 2014-09-05 (×2): 8 mg/h via INTRAVENOUS
  Filled 2014-09-05 (×8): qty 80

## 2014-09-05 MED ORDER — LIDOCAINE HCL (PF) 1 % IJ SOLN
INTRAMUSCULAR | Status: AC
  Filled 2014-09-05: qty 10

## 2014-09-05 MED ORDER — ALBUTEROL SULFATE (2.5 MG/3ML) 0.083% IN NEBU
2.5000 mg | INHALATION_SOLUTION | RESPIRATORY_TRACT | Status: DC | PRN
Start: 2014-09-05 — End: 2014-09-07

## 2014-09-05 MED ORDER — HYDROCORTISONE NA SUCCINATE PF 100 MG IJ SOLR
50.0000 mg | Freq: Three times a day (TID) | INTRAMUSCULAR | Status: DC
  Administered 2014-09-05 – 2014-09-07 (×8): 50 mg via INTRAVENOUS
  Filled 2014-09-05: qty 2
  Filled 2014-09-05 (×4): qty 1
  Filled 2014-09-05 (×2): qty 2
  Filled 2014-09-05: qty 1
  Filled 2014-09-05: qty 2
  Filled 2014-09-05: qty 1

## 2014-09-05 MED ORDER — POTASSIUM CHLORIDE CRYS ER 20 MEQ PO TBCR: 40.0000 meq | EXTENDED_RELEASE_TABLET | Freq: Once | ORAL | Status: DC

## 2014-09-05 MED ORDER — URSODIOL 300 MG PO CAPS
300.0000 mg | ORAL_CAPSULE | Freq: Two times a day (BID) | ORAL | Status: DC
  Administered 2014-09-05 – 2014-09-07 (×5): 300 mg via ORAL
  Filled 2014-09-05 (×7): qty 1

## 2014-09-05 MED ORDER — POTASSIUM CHLORIDE 10 MEQ/100ML IV SOLN
10.0000 meq | INTRAVENOUS | Status: AC
  Administered 2014-09-05 (×2): 10 meq via INTRAVENOUS
  Filled 2014-09-05 (×3): qty 100

## 2014-09-05 MED ORDER — SODIUM CHLORIDE 0.9 % IV SOLN
80.0000 mg | Freq: Once | INTRAVENOUS | Status: AC
  Administered 2014-09-05: 80 mg via INTRAVENOUS
  Filled 2014-09-05: qty 80

## 2014-09-05 MED ORDER — SODIUM CHLORIDE 0.9 % IV SOLN
25.0000 ug/h | INTRAVENOUS | Status: DC
  Administered 2014-09-05 – 2014-09-06 (×2): 25 ug/h via INTRAVENOUS
  Filled 2014-09-05 (×3): qty 1

## 2014-09-05 MED ORDER — MORPHINE SULFATE 2 MG/ML IJ SOLN
2.0000 mg | INTRAMUSCULAR | Status: DC | PRN
  Administered 2014-09-05 – 2014-09-06 (×9): 2 mg via INTRAVENOUS
  Filled 2014-09-05 (×9): qty 1

## 2014-09-05 MED ORDER — POTASSIUM CHLORIDE 10 MEQ/100ML IV SOLN
10.0000 meq | Freq: Once | INTRAVENOUS | Status: AC
  Administered 2014-09-05: 10 meq via INTRAVENOUS
  Filled 2014-09-05: qty 100

## 2014-09-05 MED ORDER — METRONIDAZOLE IN NACL 5-0.79 MG/ML-% IV SOLN
500.0000 mg | Freq: Three times a day (TID) | INTRAVENOUS | Status: DC
  Administered 2014-09-05 – 2014-09-07 (×7): 500 mg via INTRAVENOUS
  Filled 2014-09-05 (×8): qty 100

## 2014-09-05 NOTE — ED Notes (Signed)
Patient transported back from Ultrasound 

## 2014-09-05 NOTE — H&P (Signed)
[facility] History and Physical  Patient: Melanie Conley  MRN: [id number]  DOB: May 04, 1990  DOS: the patient was seen and examined on 09/05/2014 PCP: Woodfin Ganja, MD  Referring physician: Dr. Judd Lien Chief Complaint: Abdominal pain  HPI: Melanie Conley is a 24 y.o. 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 The patient is presenting with complaints of abdominal pain. Although the history is significantly limited as the patient is unable to provide any history due to her confusion. Patient refused to contact her family member as well. The patient initially complained of sharp abdominal pain also had some fever as well as vomiting 1. She did have dark black color bowel movement while she was here in the ER. There was no fall trauma or injury reported. The patient was able to stand up on her own. Patient denied any complaint of shortness of breath. She mentions she is taking all her medications. Her last paracentesis was on 08/11/2014. She did receive albumin after that. She is supposed to be on a lower dose of Imuran 25 mg daily based on documentation from infectious disease.  The patient is coming from home.   Review of Systems: as mentioned in the history of present illness.  A comprehensive review of the other systems is negative.  Past Medical History  Diagnosis Date  . Liver disease   . Hepatitis   . Anemia   . Esophageal varices   . Autoimmune hepatitis   . GIB (gastrointestinal bleeding)   . Cirrhosis   . Primary sclerosing cholangitis   . Irregular periods 08/04/2014  . Vaginal Pap smear, abnormal    Past Surgical History  Procedure Laterality Date  . Gastric banding port revision    . Tonsillectomy    .  Esophagogastroduodenoscopy  07/22/2011    Procedure: ESOPHAGOGASTRODUODENOSCOPY (EGD);  Surgeon: Graylin Shiver, MD;  Location: [facility] ENDOSCOPY;  Service: Endoscopy;  Laterality: N/A;   Social History:  reports that she has never smoked. She has never used smokeless tobacco. She reports that she does not drink alcohol or use illicit drugs.  No Known Allergies  Family History  Problem Relation Age of Onset  . Adopted: Yes    Prior to Admission medications   Medication Sig Start Date End Date Taking? Authorizing Provider  albuterol (PROVENTIL) (2.5 MG/3ML) 0.083% nebulizer solution Take 2.5 mg by nebulization every 4 (four) hours as needed for wheezing or shortness of breath.  07/14/14   Historical Provider, MD  azaTHIOprine (IMURAN) 50 MG tablet Take 50 mg by mouth daily. 06/21/14   Historical Provider, MD  carvedilol (COREG) 3.125 MG tablet Take 3.125 mg by mouth 2 (two) times daily. 06/17/14 06/17/15  Historical Provider, MD  furosemide (LASIX) 40 MG tablet Take 1 tablet (40 mg total) by mouth daily. 06/05/14   Shanker Levora Dredge, MD  lactose free nutrition (BOOST) LIQD Take 237 mLs by mouth daily.    Historical Provider, MD  lactulose (CHRONULAC) 10 GM/15ML solution Take 30 mLs (20 g total) by mouth 2 (two) times daily. Patient taking differently: Take 20 g by mouth daily as needed for mild constipation.  06/07/14   Shanker Levora Dredge, MD  lidocaine (XYLOCAINE) 2 % solution Use as directed 200 mg in the mouth or throat 3 (three) times daily as needed. For  GI / esophageal upset 07/14/14   Historical Provider, MD  metoCLOPramide (REGLAN) 5 MG tablet Take 5 mg by mouth 2 (two) times daily. 30 minutes before a meal 07/26/14   Historical Provider, MD  omeprazole (PRILOSEC) 40 MG capsule Take 40 mg by mouth daily.    Historical Provider, MD  potassium chloride (K-DUR) 10 MEQ tablet Take 1 tablet (10 mEq total) by mouth once. 08/08/14   Azalia Bilis, MD  predniSONE (DELTASONE) 5 MG tablet Take 5 mg by mouth  daily.    Historical Provider, MD  Protein POWD Take 1 scoop by mouth 2 (two) times daily.    Historical Provider, MD  spironolactone (ALDACTONE) 50 MG tablet Take 100 mg by mouth daily.     Historical Provider, MD  ursodiol (ACTIGALL) 300 MG capsule Take 300 mg by mouth 2 (two) times daily.     Historical Provider, MD  valGANciclovir (VALCYTE) 450 MG tablet Take 900 mg by mouth daily. 07/08/14 10/06/14  Historical Provider, MD    Physical Exam: Filed Vitals:   09/05/14 0330 09/05/14 0425 09/05/14 0430 09/05/14 0500  BP: 114/74 120/72 121/74 112/64  Pulse: 103 109 109 100  Temp:      TempSrc:      Resp: 16 16 16 18   Height:      Weight:      SpO2: 93% 93% 94% 95%   General: Alert, Awake and Oriented to Time, Person. Appear in moderate distress Eyes: PERRL, icterus present ENT: Oral Mucosa clear moist Neck: no JVD Cardiovascular: S1 and S2 Present, no Murmur, Peripheral Pulses Present Respiratory: Bilateral Air entry equal and Decreased,  Clear to Auscultation, no Crackles, no wheezes Abdomen: Bowel Sound present, Soft and left-sided tender Skin: no Rash Extremities: no Pedal edema, no calf tenderness Neurologic: Patient is having difficulty following commands although is able to spontaneously more lower extremities and withdraw to painful stimuli.  Labs on Admission:  CBC:  Recent Labs Lab 09/04/14 2053  WBC 8.8  HGB 8.0*  HCT 24.8*  MCV 96.9  PLT 145*    CMP     Component Value Date/Time   NA 135 09/04/2014 2053   K 2.5* 09/04/2014 2053   CL 97* 09/04/2014 2053   CO2 29 09/04/2014 2053   GLUCOSE 107* 09/04/2014 2053   BUN 12 09/04/2014 2053   CREATININE 0.66 09/04/2014 2053   CALCIUM 8.6* 09/04/2014 2053   PROT 6.2* 09/04/2014 2053   ALBUMIN 2.8* 09/04/2014 2053   AST 57* 09/04/2014 2053   ALT 41 09/04/2014 2053   ALKPHOS 465* 09/04/2014 2053   BILITOT 7.4* 09/04/2014 2053   GFRNONAA >60 09/04/2014 2053   GFRAA >60 09/04/2014 2053     Recent Labs Lab  09/04/14 2053  LIPASE 64*    No results for input(s): CKTOTAL, CKMB, CKMBINDEX, TROPONINI in the last 168 hours. BNP (last 3 results) No results for input(s): BNP in the last 8760 hours.  ProBNP (last 3 results) No results for input(s): PROBNP in the last 8760 hours.   Radiological Exams on Admission: Ct Abdomen Pelvis W Contrast  09/05/2014   CLINICAL DATA:  Acute onset of periumbilical abdominal pain. Initial encounter.  EXAM: CT ABDOMEN AND PELVIS WITH CONTRAST  TECHNIQUE: Multidetector CT imaging of the abdomen and pelvis was performed using the standard protocol following bolus administration of intravenous contrast.  CONTRAST:  75mL OMNIPAQUE IOHEXOL 300 MG/ML  SOLN  COMPARISON:  CT of the abdomen and pelvis performed 08/08/2014  FINDINGS: The visualized lung  bases are clear.  Large volume ascites is again noted within the abdomen and pelvis, perhaps slightly decreased from the prior study.  Underlying changes of hepatic cirrhosis are again noted, with slight nodularity of the hepatic contour. 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 Splenomegaly is again noted. The spleen measures 16.3 cm in length. Scattered splenic, gastric and esophageal varices are again noted. The portal venous system appears grossly patent.  The pancreas and adrenal glands are grossly unremarkable in appearance. The kidneys are unremarkable in appearance. No perinephric stranding is appreciated. No renal or ureteral stones are identified. There is no evidence of hydronephrosis.  No free fluid is identified. The small bowel is unremarkable in appearance. The stomach is within normal limits. No acute vascular abnormalities are seen.  The appendix is normal in caliber and contains air, without evidence of appendicitis. There is new mucosal edema involving the entirety of the colon. Though this may reflect hypoproteinemia or other systemic condition, mild infectious or inflammatory colitis cannot be excluded.  The bladder is  mildly distended and grossly unremarkable. Scattered calcification is noted along a 1.4 cm urachal nodule. The uterus is unremarkable in appearance. The ovaries are relatively symmetric. No suspicious adnexal masses are seen. No inguinal lymphadenopathy is seen.  No acute osseous abnormalities are identified.  IMPRESSION: 1. New mucosal edema involving the entirety of the colon. Though this may reflect hypoproteinemia or other systemic condition, mild infectious or inflammatory colitis cannot be excluded. 2. Large volume ascites again noted within the abdomen and pelvis, perhaps slightly decreased from the prior study. 3. Underlying changes of hepatic cirrhosis again noted, with splenomegaly. Multiple splenic infarcts again seen. Scattered splenic, gastric and esophageal varices again noted. 4. 1.4 cm urachal nodule, with scattered constipation, appears grossly stable from prior studies.   Electronically Signed   By: Roanna Raider M.D.   On: 09/05/2014 01:57   Assessment/Plan Principal Problem:   Colitis Active Problems:   GI bleeding   Protein-calorie malnutrition, severe   Hypokalemia   Hypoalbuminemia   Jaundice   Primary sclerosing cholangitis   Hepatic encephalopathy   1. Colitis The patient is presenting with numbness of abdominal pain. A CT scan was attempted which is positive for diffuse colonic bowel wall thickening consistent with possible hypoalbuminemia as well as a possible colitis. With her history of immunosuppression she will be treated with ceftriaxone and Flagyl. We will check C. difficile by PCR. Follow other workup. GI will be consulted in the morning.  2. Cirrhosis of the liver. GI bleed. The patient also has complains of about black color bowel movement. She will be given Protonix infusion. Due to limited IV access I will hold off on octreotide as this can be associated with her colitis rather than esophageal varices. Ceftriaxone for prophylaxis.  3. Abdominal  pain. Ascites. The patient also has complains of abdominal pain and has history of asthma provider Alexis. We will try ultrasound paracentesis in the morning. Ceftriaxone for prophylaxis.  4. Hepatic encephalopathy. Lactulose scheduled 3 times a day. Monitor ammonia at the time of discharge to assess baseline ammonia level.  5. Primary sclerosing cholangitis. Immunosuppressed status. Stress dose thyroid. Holding Imuran at present.  6. Hypokalemia. Replacing.  Advance goals of care discussion: Full code as per available documentation.   DVT Prophylaxis:mechanical compression device Nutrition: Nothing by mouth  Disposition: Admitted as inpatient, step-down unit.  Author: Lynden Oxford, MD [facility] Pager: [phone] 09/05/2014  If 7PM-7AM, please contact night-coverage [email or web address] Password [email or web address]

## 2014-09-05 NOTE — ED Notes (Signed)
Patient transported to CT 

## 2014-09-05 NOTE — Progress Notes (Signed)
UR COMPLETED  

## 2014-09-05 NOTE — Progress Notes (Signed)
PROGRESS NOTE  Melanie Conley WJX:[id number] DOB: [birth year] DOA: 09/04/2014 PCP: Woodfin Ganja, MD  HPI/Recap of past 36 hours: 24 year old female with past medical history of autoimmune hepatitis and primary sclerosing cholangitis with cirrhosis admitted on the early morning hours of 8/8 for 2 days of diffuse abdominal pain along with worsening ascites and found to have diffuse colitis.  Patient started on IV antibiotics plus fluid. She underwent a paracentesis of which results are pending. Gastroenterology consulted. Patient's hemoglobin which normally ranges from 8.3-10.2 dropped from admitting hemoglobin of 8.0 checked at 11 PM on 8/7 to 6.5 this morning at 8 AM on 8/8. She was also noted to be somewhat confused on admission with ammonia level of 128  Patient self seen post paracentesis. Complains of diffuse abdominal pain. She is groggy, but somewhat less confused  Assessment/Plan: Principal Problem:   Colitis: Unclear etiology. May be viral versus CMV which patient has a history of. Continue to monitor. Cover with Rocephin and Flagyl until SBP ruled out Active Problems:   GI bleeding with acute blood loss anemia: Patient reported history of throwing up blood at home. Transfusing 2 units packed red blood cells. GI following closely   Protein-calorie malnutrition, severe: Nutrition consult in place   Hypokalemia: Replacing   Hypoalbuminemia and hyperbilirubinemia: Secondary to liver disease    Primary sclerosing cholangitis with secondary cirrhosis: Being followed by GI at [facility]. May need to transfer there she does not stabilize.    Hepatic encephalopathy: Continue lactulose   Acute blood loss anemia   Code Status: full code   Family Communication: left message with family   Disposition Plan: hopefully can stabilize and discharge once colitis resolved, patient eating by mouth and no further blood loss.   Consultants:  Gastroenterology   Procedures:  Status post thoracentesis  done 8/8  Status post 2 units packed red blood cells on 8/8   Antibiotics:  IV Flagyl 8/8-present  IV Rocephin 8/8-present  Valcyte 8/8-present    Objective: BP 110/66 mmHg  Pulse 77  Temp(Src) 98.1 F (36.7 C) (Oral)  Resp 17  Ht 4\' 11"  (1.499 m)  Wt 44.5 kg (98 lb 1.7 oz)  BMI 19.80 kg/m2  SpO2 99%  Intake/Output Summary (Last 24 hours) at 09/05/14 1657 Last data filed at 09/05/14 1600  Gross per 24 hour  Intake     75 ml  Output      0 ml  Net     75 ml   [facility] Weights   09/04/14 2038 09/05/14 1530  Weight: 44.906 kg (99 lb) 44.5 kg (98 lb 1.7 oz)    Exam:   General:  Groggy, mild distress secondary to generalized abdominal pain   Cardiovascular: regular rate and rhythm, S1-S2   Respiratory: clear to auscultation bilaterally   Abdomen: Soft, mild distention, hypoactive bowel sounds, generalized tenderness   Musculoskeletal: emaciated    Data Reviewed: Basic Metabolic Panel:  Recent Labs Lab 09/04/14 2053 09/05/14 0733  NA 135 135  K 2.5* 3.0*  CL 97* 98*  CO2 29 29  GLUCOSE 107* 110*  BUN 12 11  CREATININE 0.66 0.64  CALCIUM 8.6* 8.1*  MG  --  1.6*  PHOS  --  2.8   Liver Function Tests:  Recent Labs Lab 09/04/14 2053 09/05/14 0733  AST 57* 42*  ALT 41 34  ALKPHOS 465* 381*  BILITOT 7.4* 5.7*  PROT 6.2* 5.2*  ALBUMIN 2.8* 2.4*    Recent Labs Lab 09/04/14 2053  LIPASE 64*  Recent Labs Lab 09/05/14 0215  AMMONIA 128*   CBC:  Recent Labs Lab 09/04/14 2053 09/05/14 0808  WBC 8.8 5.0  NEUTROABS  --  3.4  HGB 8.0* 6.5*  HCT 24.8* 20.5*  MCV 96.9 99.0  PLT 145* 91*   Cardiac Enzymes:   No results for input(s): CKTOTAL, CKMB, CKMBINDEX, TROPONINI in the last 168 hours. BNP (last 3 results) No results for input(s): BNP in the last 8760 hours.  ProBNP (last 3 results) No results for input(s): PROBNP in the last 8760 hours.  CBG: No results for input(s): GLUCAP in the last 168 hours.  Recent Results (from the  past 240 hour(s))  Gram stain     Status: None   Collection Time: 09/05/14  9:50 AM  Result Value Ref Range Status   Specimen Description FLUID PERITONEAL  Final   Special Requests NONE  Final   Gram Stain   Final    FEW WBC PRESENT,BOTH PMN AND MONONUCLEAR NO ORGANISMS SEEN    Report Status 09/05/2014 FINAL  Final     Studies: Ct Abdomen Pelvis W Contrast  09/05/2014   CLINICAL DATA:  Acute onset of periumbilical abdominal pain. Initial encounter.  EXAM: CT ABDOMEN AND PELVIS WITH CONTRAST  TECHNIQUE: Multidetector CT imaging of the abdomen and pelvis was performed using the standard protocol following bolus administration of intravenous contrast.  CONTRAST:  75mL OMNIPAQUE IOHEXOL 300 MG/ML  SOLN  COMPARISON:  CT of the abdomen and pelvis performed 08/08/2014  FINDINGS: The visualized lung bases are clear.  Large volume ascites is again noted within the abdomen and pelvis, perhaps slightly decreased from the prior study.  Underlying changes of hepatic cirrhosis are again noted, with slight nodularity of the hepatic contour. 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 Splenomegaly is again noted. The spleen measures 16.3 cm in length. Scattered splenic, gastric and esophageal varices are again noted. The portal venous system appears grossly patent.  The pancreas and adrenal glands are grossly unremarkable in appearance. The kidneys are unremarkable in appearance. No perinephric stranding is appreciated. No renal or ureteral stones are identified. There is no evidence of hydronephrosis.  No free fluid is identified. The small bowel is unremarkable in appearance. The stomach is within normal limits. No acute vascular abnormalities are seen.  The appendix is normal in caliber and contains air, without evidence of appendicitis. There is new mucosal edema involving the entirety of the colon. Though this may reflect hypoproteinemia or other systemic condition, mild infectious or inflammatory colitis cannot be  excluded.  The bladder is mildly distended and grossly unremarkable. Scattered calcification is noted along a 1.4 cm urachal nodule. The uterus is unremarkable in appearance. The ovaries are relatively symmetric. No suspicious adnexal masses are seen. No inguinal lymphadenopathy is seen.  No acute osseous abnormalities are identified.  IMPRESSION: 1. New mucosal edema involving the entirety of the colon. Though this may reflect hypoproteinemia or other systemic condition, mild infectious or inflammatory colitis cannot be excluded. 2. Large volume ascites again noted within the abdomen and pelvis, perhaps slightly decreased from the prior study. 3. Underlying changes of hepatic cirrhosis again noted, with splenomegaly. Multiple splenic infarcts again seen. Scattered splenic, gastric and esophageal varices again noted. 4. 1.4 cm urachal nodule, with scattered constipation, appears grossly stable from prior studies.   Electronically Signed   By: Roanna Raider M.D.   On: 09/05/2014 01:57   US Paracentesis  09/05/2014   INDICATION: Cirrhosis, recurrent ascites and request for paracentesis.  EXAM: ULTRASOUND-GUIDED PARACENTESIS  COMPARISON:  Paracentesis 07/30/14.  MEDICATIONS: None.  COMPLICATIONS: None immediate  TECHNIQUE: Informed written consent was obtained from the patient after a discussion of the risks, benefits and alternatives to treatment. A timeout was performed prior to the initiation of the procedure.  Initial ultrasound scanning demonstrates a small amount of ascites within the left lower abdominal quadrant. The left lower abdomen was prepped and draped in the usual sterile fashion. 1% lidocaine was used for local anesthesia.  Under direct ultrasound guidance, a 19 gauge, 7-cm, Yueh catheter was introduced. An ultrasound image was saved for documentation purposed. The paracentesis was performed. The catheter was removed and a dressing was applied. The patient tolerated the procedure well without immediate  post procedural complication.  FINDINGS: A total of approximately 2 liters of serous fluid was removed. Samples were sent to the laboratory as requested by the clinical team.  IMPRESSION: Successful ultrasound-guided paracentesis yielding 2 liters of peritoneal fluid.  Read By:  Pattricia Boss PA-C   Electronically Signed   By: Jolaine Click M.D.   On: 09/05/2014 10:31    Scheduled Meds: . sodium chloride   Intravenous Once  . cefTRIAXone (ROCEPHIN)  IV  1 g Intravenous Q24H  . furosemide  20 mg Intravenous Once  . hydrocortisone sod succinate (SOLU-CORTEF) inj  50 mg Intravenous Q8H  . lactulose  30 g Oral TID  . lidocaine (PF)      . metronidazole  500 mg Intravenous Q8H  . [START ON 09/08/2014] pantoprazole (PROTONIX) IV  40 mg Intravenous Q12H  . potassium chloride  10 mEq Intravenous Q1 Hr x 2  . potassium chloride SA  40 mEq Oral Once  . sodium chloride  3 mL Intravenous Q12H  . ursodiol  300 mg Oral BID  . valGANciclovir  900 mg Oral Daily    Continuous Infusions: . octreotide  (SANDOSTATIN)    IV infusion 25 mcg/hr (09/05/14 1600)  . pantoprozole (PROTONIX) infusion 8 mg/hr (09/05/14 1600)     Time spent: 35 minutes  Hollice Espy  [facility] Pager [phone]. If 7PM-7AM, please contact night-coverage at [email or web address], password [email or web address] 09/05/2014, 4:57 PM  LOS: 0 days

## 2014-09-05 NOTE — Progress Notes (Signed)
CRITICAL VALUE ALERT  Critical value received:  Hgb 6.5  Date of notification: 09/05/14  Time of notification:  0836  Critical value read back:Yes.    Nurse who received alert:  Cyncere Ruhe rn  MD notified (1st page):  Rito Ehrlich, s  Time of first page:  [phone]  MD notified (2nd page):  Time of second page:  Responding MD:    Time MD responded:

## 2014-09-05 NOTE — ED Notes (Signed)
Patient transported to Ultrasound 

## 2014-09-05 NOTE — ED Notes (Signed)
CT called to inform of negative preg test

## 2014-09-05 NOTE — Procedures (Signed)
Successful US guided paracentesis from LLQ.  Yielded 2 liters of serous fluid.  No immediate complications.  Pt tolerated well.   Specimen was sent for labs.  Pattricia Boss D PA-C 09/05/2014 9:34 AM

## 2014-09-05 NOTE — Consult Note (Addendum)
Referring Provider: Dr. Rito Ehrlich Primary Care Physician:  Woodfin Ganja, MD Primary Gastroenterologist:  Gentry Fitz  Reason for Consultation:  Abdominal pain; Ascites; Cirrhosis  HPI: Melanie Conley is a 24 y.o. female with known Autoimmune hepatitis and PSC with cirrhosis who developed sharp intermittent diffuse abdominal pain for the past 2 days along with worsening ascites. Reports vomiting up dark red blood X 1 this morning prior to coming to the ER. Denies alcohol. Reports abdominal pain has decreased since paracentesis was done. She reports that she is on the liver transplant list at [facility] and sees Dr. Piedad Climes but records show that she was listed on the liver transplant list in 2006 but was INACTIVE prior to OV at [facility] in June 2016 and now is reportedly being reactivated on the transplant list. Review of records show that she was recently treated for CMV viremia. She states that she has an appt with Dr. Piedad Climes in 2 days for admission following a planned EGD and states that she will sign out AMA if she is not ready to be discharged prior to that scheduled appt. CT shows diffuse colonic edema along with a large amount of ascites and cirrhosis with portal HTN seen.   Past Medical History  Diagnosis Date  . Liver disease   . Hepatitis   . Anemia   . Esophageal varices   . Autoimmune hepatitis   . GIB (gastrointestinal bleeding)   . Cirrhosis   . Primary sclerosing cholangitis   . Irregular periods 08/04/2014  . Vaginal Pap smear, abnormal     Past Surgical History  Procedure Laterality Date  . Gastric banding port revision    . Tonsillectomy    . Esophagogastroduodenoscopy  07/22/2011    Procedure: ESOPHAGOGASTRODUODENOSCOPY (EGD);  Surgeon: Graylin Shiver, MD;  Location[facility] ENDOSCOPY;  Service: Endoscopy;  Laterality: N/A;    Prior to Admission medications   Medication Sig Start Date End Date Taking? Authorizing Provider  albuterol (PROVENTIL) (2.5 MG/3ML) 0.083% nebulizer solution  Take 2.5 mg by nebulization every 4 (four) hours as needed for wheezing or shortness of breath.  07/14/14   Historical Provider, MD  azaTHIOprine (IMURAN) 50 MG tablet Take 50 mg by mouth daily. 06/21/14   Historical Provider, MD  carvedilol (COREG) 3.125 MG tablet Take 3.125 mg by mouth 2 (two) times daily. 06/17/14 06/17/15  Historical Provider, MD  furosemide (LASIX) 40 MG tablet Take 1 tablet (40 mg total) by mouth daily. 06/05/14   Shanker Levora Dredge, MD  lactose free nutrition (BOOST) LIQD Take 237 mLs by mouth daily.    Historical Provider, MD  lactulose (CHRONULAC) 10 GM/15ML solution Take 30 mLs (20 g total) by mouth 2 (two) times daily. Patient taking differently: Take 20 g by mouth daily as needed for mild constipation.  06/07/14   Shanker Levora Dredge, MD  lidocaine (XYLOCAINE) 2 % solution Use as directed 200 mg in the mouth or throat 3 (three) times daily as needed. For GI / esophageal upset 07/14/14   Historical Provider, MD  metoCLOPramide (REGLAN) 5 MG tablet Take 5 mg by mouth 2 (two) times daily. 30 minutes before a meal 07/26/14   Historical Provider, MD  omeprazole (PRILOSEC) 40 MG capsule Take 40 mg by mouth daily.    Historical Provider, MD  potassium chloride (K-DUR) 10 MEQ tablet Take 1 tablet (10 mEq total) by mouth once. 08/08/14   Azalia Bilis, MD  predniSONE (DELTASONE) 5 MG tablet Take 5 mg by mouth daily.    Historical Provider,  MD  Protein POWD Take 1 scoop by mouth 2 (two) times daily.    Historical Provider, MD  spironolactone (ALDACTONE) 50 MG tablet Take 100 mg by mouth daily.     Historical Provider, MD  ursodiol (ACTIGALL) 300 MG capsule Take 300 mg by mouth 2 (two) times daily.     Historical Provider, MD  valGANciclovir (VALCYTE) 450 MG tablet Take 900 mg by mouth daily. 07/08/14 10/06/14  Historical Provider, MD    Scheduled Meds: . furosemide  20 mg Intravenous Once  . hydrocortisone sod succinate (SOLU-CORTEF) inj  50 mg Intravenous Q8H  . lactulose  30 g Oral TID  .  lidocaine (PF)      . [START ON 09/08/2014] pantoprazole (PROTONIX) IV  40 mg Intravenous Q12H  . potassium chloride SA  40 mEq Oral Once  . sodium chloride  3 mL Intravenous Q12H  . ursodiol  300 mg Oral BID  . valGANciclovir  900 mg Oral Daily   Continuous Infusions: . sodium chloride    . cefTRIAXone (ROCEPHIN)  IV Stopped (09/05/14 0547)  . metronidazole Stopped (09/05/14 0700)  . octreotide  (SANDOSTATIN)    IV infusion 25 mcg/hr (09/05/14 0948)  . pantoprozole (PROTONIX) infusion 8 mg/hr (09/05/14 0748)   PRN Meds:.albuterol, morphine injection, ondansetron **OR** ondansetron (ZOFRAN) IV  Allergies as of 09/04/2014  . (No Known Allergies)    Family History  Problem Relation Age of Onset  . Adopted: Yes    History   Social History  . Marital Status: Single    Spouse Name: N/A  . Number of Children: N/A  . Years of Education: N/A   Occupational History  . Not on file.   Social History Main Topics  . Smoking status: Never Smoker   . Smokeless tobacco: Never Used  . Alcohol Use: No  . Drug Use: No  . Sexual Activity: Yes    Birth Control/ Protection: None   Other Topics Concern  . 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       History of Present Illness: Patient is a pleasant 24 year old [place] American female who was diagnosed with liver disease at age 14 when she presented with a GI bleed. She has undergone liver biopsy and ERCP in 2003. 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 Her complications of cirrhosis include bleeding esophageal varicies, ascites, SBP and encephalopathy. She was listed for OLT at [facility] in 2006 but made status 7 due to a low MELD score and a good quality of life. We have been unable to reactivate her as an adult because she has not passed the psychosocial evaluation. She has undergone formal  neuropsych testing. In the past, she has stated that she does not wish to undergo OLT even if she needs it. In regards to her AIH, her serologies are negative and she has been on Imuran and prednisone since her diagnosis in 2003. Regarding her PSC, she has had one bout of cholangitis years ago per report. She denies a history of stenting or dominant biliary stricture or choledolcholithiasis. She was previously on high dose ursodiol.       She presents today with her Mom. I last saw her in Feb 2015. She has been hospitalized here at [facility] for abdominal pain/worsening ascites in 11/2013 and the  again at [facility] in 12/2013 for SBP despite being on Levoquin prophylaxis. Her outside records from 12/2013 were reviewed in Care Everywhere. Ascites fluid showed 7866 nuc cells with 83% polys. Blood Ctx were negative. She had 3 days of IV Rocephin with IV albumin on D1 and D3 as well as 7 days of Augmentin. She also had a 2.7L paracentesis. Stool studies were neg (C diff and immunocompromised host panel). She also had a CT scan which was unremarkable for acute event- patent PV. She denies any worsening jaundice, pruritis, SOB, CP, increased abdominal girth, LE edema, melena, BRBPR, confusion, depression, nausea, vomiting, or diarrhea. Her abdominal pain has resolved and her ascites is well controlled on aldactone 100mg  and lasix 40mg  daily. She is following a low Na diet.      Cirrhosis Care:    1.HCC screen: MRI 09/06/13 and CT scan 12/13/13 cirrhosis, ascites, marked splenomegaly, no masses, biliary stricturing   2.Varicies surveillance: EGD 07/17/13 Grade II esophageal varices with EVL X 2. Missed follow-up.   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    --  Liver biopsy 08/03/01 minute fragments bile duct proliferation and periductular fibrosis; hepatitis G1-2, S2; ERCP 09/23/01 Subtle changes/narrowing but no dominant strictures; MRCP images 10/08 showed focal narrowing in right hepatic system; serologies ANA neg ASMA neg, antiLKMI neg AMA neg IgG normal (low level + ASMA 1:20 in 2003)    -- Occult GI bleeding with many EGD's, VCE 11/09 normal, Colonoscopy 7/06 and ?2/10 with biopsies negative for IBD including TI.    -- seasonal allergies    -- s/p tonsilectomy    -- Immune to HAV and HBV (2007)    -- EBV IgG pos CMV IgG and IgM neg       Social History:    Lives near [place], [place] with parents sister and brother. Yaneisy is the middle child (she is adopted).   Her Mom teaches Ambulance person.    No tobacco or alcohol.    Rosella had been at [employer] studying early childhood development but is currently working in Warden/ranger at [employer].   Pets: 1 dog and 1 cat        Review of Systems: All negative except as stated above in HPI.  Physical Exam: Vital signs: Filed Vitals:   09/05/14 1000  BP: 121/80  Pulse: 120  Temp: 98.3  Resp: 19     General:   Lethargic, thin, no acute distress HEENT: +scleral icterus, area of white plaques on tongue Neck: supple, nontender Lungs:  Clear throughout to auscultation.   No wheezes, crackles, or rhonchi. No acute distress. Heart:  Tachycardic, regular rhythm; no murmurs, clicks, rubs,  or gallops. Abdomen: mild distention, diffuse tenderness with guarding, thin, +BS  Rectal:  Deferred Ext: pulses intact, no edema Neuro: no asterixis Skin: no rash Pscyh: normal mood and affect  GI:  Lab Results:  Recent Labs  09/04/14 2053 09/05/14 0808  WBC 8.8 5.0  HGB 8.0* 6.5*  HCT 24.8* 20.5*  PLT 145* 91*   BMET  Recent Labs  09/04/14 2053 09/05/14 0733  NA 135 135  K 2.5* 3.0*  CL 97* 98*  CO2 29 29  GLUCOSE 107* 110*  BUN  12 11  CREATININE 0.66 0.64   CALCIUM 8.6* 8.1*   LFT  Recent Labs  09/05/14 0733  PROT 5.2*  ALBUMIN 2.4*  AST 42*  ALT 34  ALKPHOS 381*  BILITOT 5.7*   PT/INR  Recent Labs  09/05/14 0733  LABPROT 19.9*  INR 1.69*     Studies/Results: Ct Abdomen Pelvis W Contrast  09/05/2014   CLINICAL DATA:  Acute onset of periumbilical abdominal pain. Initial encounter.  EXAM: CT ABDOMEN AND PELVIS WITH CONTRAST  TECHNIQUE: Multidetector CT imaging of the abdomen and pelvis was performed using the standard protocol following bolus administration of intravenous contrast.  CONTRAST:  75mL OMNIPAQUE IOHEXOL 300 MG/ML  SOLN  COMPARISON:  CT of the abdomen and pelvis performed 08/08/2014  FINDINGS: The visualized lung bases are clear.  Large volume ascites is again noted within the abdomen and pelvis, perhaps slightly decreased from the prior study.  Underlying changes of hepatic cirrhosis are again noted, with slight nodularity of the hepatic contour. 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 Splenomegaly is again noted. The spleen measures 16.3 cm in length. Scattered splenic, gastric and esophageal varices are again noted. The portal venous system appears grossly patent.  The pancreas and adrenal glands are grossly unremarkable in appearance. The kidneys are unremarkable in appearance. No perinephric stranding is appreciated. No renal or ureteral stones are identified. There is no evidence of hydronephrosis.  No free fluid is identified. The small bowel is unremarkable in appearance. The stomach is within normal limits. No acute vascular abnormalities are seen.  The appendix is normal in caliber and contains air, without evidence of appendicitis. There is new mucosal edema involving the entirety of the colon. Though this may reflect hypoproteinemia or other systemic condition, mild infectious or inflammatory colitis cannot be excluded.  The bladder is mildly distended and grossly unremarkable. Scattered calcification is noted along a  1.4 cm urachal nodule. The uterus is unremarkable in appearance. The ovaries are relatively symmetric. No suspicious adnexal masses are seen. No inguinal lymphadenopathy is seen.  No acute osseous abnormalities are identified.  IMPRESSION: 1. New mucosal edema involving the entirety of the colon. Though this may reflect hypoproteinemia or other systemic condition, mild infectious or inflammatory colitis cannot be excluded. 2. Large volume ascites again noted within the abdomen and pelvis, perhaps slightly decreased from the prior study. 3. Underlying changes of hepatic cirrhosis again noted, with splenomegaly. Multiple splenic infarcts again seen. Scattered splenic, gastric and esophageal varices again noted. 4. 1.4 cm urachal nodule, with scattered constipation, appears grossly stable from prior studies.   Electronically Signed   By: Roanna Raider M.D.   On: 09/05/2014 01:57    Impression/Plan: Cirrhosis with AIH and PSC is reportedly on the liver transplant list at [facility] and has worsening ascites with abdominal pain. Reports of dark bloody emesis prior to presentation to hospital. No evidence of hepatic encephalopathy. Await ascitic fluid results for SBP. MELD score 20. Doubt variceal bleed but would continue Octreotide. Hold off on EGD at this time. Clear liquid diet. May need transfer to [facility] for further management since on their transplant list and was due to see them this week. Colitis on CT could be due to CMV and/or hypoalbuminemia. Continue supportive care. Follow H/H closely.       LOS: 0 days   Vance Belcourt C.  09/05/2014, 11:38 AM  Pager [phone]  If no answer or after 5 PM call [phone]

## 2014-09-06 DIAGNOSIS — E8809 Other disorders of plasma-protein metabolism, not elsewhere classified: Secondary | ICD-10-CM

## 2014-09-06 LAB — TYPE AND SCREEN
ABO/RH(D): B POS
ANTIBODY SCREEN: NEGATIVE
UNIT DIVISION: 0
Unit division: 0

## 2014-09-06 LAB — AMMONIA: Ammonia: 71 umol/L — ABNORMAL HIGH (ref 9–35)

## 2014-09-06 LAB — CBC
HCT: 28.9 % — ABNORMAL LOW (ref 36.0–46.0)
HEMATOCRIT: 25.9 % — AB (ref 36.0–46.0)
HEMOGLOBIN: 8.7 g/dL — AB (ref 12.0–15.0)
Hemoglobin: 9.6 g/dL — ABNORMAL LOW (ref 12.0–15.0)
MCH: 31.4 pg (ref 26.0–34.0)
MCH: 31.1 pg (ref 26.0–34.0)
MCHC: 33.6 g/dL (ref 30.0–36.0)
MCHC: 33.2 g/dL (ref 30.0–36.0)
MCV: 93.5 fL (ref 78.0–100.0)
MCV: 93.5 fL (ref 78.0–100.0)
PLATELETS: 53 10*3/uL — AB (ref 150–400)
Platelets: 63 10*3/uL — ABNORMAL LOW (ref 150–400)
RBC: 2.77 MIL/uL — ABNORMAL LOW (ref 3.87–5.11)
RBC: 3.09 MIL/uL — ABNORMAL LOW (ref 3.87–5.11)
RDW: 17.7 % — ABNORMAL HIGH (ref 11.5–15.5)
RDW: 18 % — AB (ref 11.5–15.5)
WBC: 4.2 10*3/uL (ref 4.0–10.5)
WBC: 6.2 10*3/uL (ref 4.0–10.5)

## 2014-09-06 LAB — BASIC METABOLIC PANEL
Anion gap: 8 (ref 5–15)
BUN: 7 mg/dL (ref 6–20)
CO2: 24 mmol/L (ref 22–32)
CREATININE: 0.63 mg/dL (ref 0.44–1.00)
Calcium: 8.4 mg/dL — ABNORMAL LOW (ref 8.9–10.3)
Chloride: 99 mmol/L — ABNORMAL LOW (ref 101–111)
GFR calc Af Amer: >60 mL/min (ref >60)
GFR calc non Af Amer: >60 mL/min (ref >60)
Glucose, Bld: 130 mg/dL — ABNORMAL HIGH (ref 65–99)
POTASSIUM: 3.3 mmol/L — AB (ref 3.5–5.1)
Sodium: 131 mmol/L — ABNORMAL LOW (ref 135–145)

## 2014-09-06 LAB — C DIFFICILE QUICK SCREEN W PCR REFLEX
C Diff antigen: POSITIVE — AB
C Diff interpretation: POSITIVE
C Diff toxin: POSITIVE — AB

## 2014-09-06 LAB — PATHOLOGIST SMEAR REVIEW

## 2014-09-06 MED ORDER — CIPROFLOXACIN HCL 500 MG PO TABS: 500.0000 mg | ORAL_TABLET | Freq: Two times a day (BID) | ORAL | Status: DC

## 2014-09-06 MED ORDER — METRONIDAZOLE 500 MG PO TABS: 500.0000 mg | ORAL_TABLET | Freq: Three times a day (TID) | ORAL | Status: DC

## 2014-09-06 MED ORDER — ENSURE ENLIVE PO LIQD
237.0000 mL | Freq: Two times a day (BID) | ORAL | Status: DC
  Administered 2014-09-06 – 2014-09-07 (×3): 237 mL via ORAL

## 2014-09-06 MED ORDER — MORPHINE SULFATE 2 MG/ML IJ SOLN
2.0000 mg | Freq: Once | INTRAMUSCULAR | Status: AC
  Administered 2014-09-06: 2 mg via INTRAVENOUS

## 2014-09-06 MED ORDER — OXYCODONE-ACETAMINOPHEN 5-325 MG PO TABS
1.0000 | ORAL_TABLET | ORAL | Status: DC | PRN
  Administered 2014-09-07: 2 via ORAL
  Filled 2014-09-06: qty 2
  Filled 2014-09-06: qty 1

## 2014-09-06 MED ORDER — MORPHINE SULFATE 2 MG/ML IJ SOLN
INTRAMUSCULAR | Status: AC
  Administered 2014-09-06: 2 mg via INTRAVENOUS
  Filled 2014-09-06: qty 1

## 2014-09-06 MED ORDER — POTASSIUM CHLORIDE CRYS ER 20 MEQ PO TBCR
40.0000 meq | EXTENDED_RELEASE_TABLET | Freq: Once | ORAL | Status: AC
  Administered 2014-09-06: 40 meq via ORAL
  Filled 2014-09-06: qty 2

## 2014-09-06 NOTE — Progress Notes (Signed)
Report given to Rachael, 5W, all questions answered.  Mother, Salimata Christenson notified of transfer.   Patient without complaints

## 2014-09-06 NOTE — Progress Notes (Signed)
Initial Nutrition Assessment  DOCUMENTATION CODES:   Not applicable  INTERVENTION:   Provide Ensure Enlive po BID, each supplement provides 350 kcal and 20 grams of protein.  Encourage adequate PO intake.  NUTRITION DIAGNOSIS:   Inadequate oral intake related to altered GI function as evidenced by meal completion < 25%, meal completion < 50%.  GOAL:   Patient will meet greater than or equal to 90% of their needs  MONITOR:   PO intake, Supplement acceptance, Weight trends, Labs, I & O's, Diet advancement  REASON FOR ASSESSMENT:   Malnutrition Screening Tool    ASSESSMENT:   24 y.o. 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 The patient is presenting with complaints of abdominal pain. S/P paracentesis yesterday.   Pt has just been advanced to a full liquid diet. Meal completion on clear liquids was 25%. Pt reports appetite is good and currently has no abdominal pains. PTA pt reports eating well with at least 2 meals a day with a protein shake in between meals, up until her abdominal pains started 2 days ago. Pt reports usual body weight of 114 lbs which he reports last weighing a couple of years back. RD to order Ensure to aid in caloric and protein needs. Pt was educated on the importance of adequate protein intake and to continue her protein shakes at home.   Nutrition-Focused physical exam completed. Findings are no fat depletion, moderate to severe muscle depletion, and no edema.   Labs: Low sodium, potassium, chloride, calcium.  Diet Order:  Diet - low sodium heart healthy Diet full liquid Room service appropriate?: Yes; Fluid consistency:: Thin  Skin:  Reviewed, no issues  Last BM:  8/8  Height:   Ht Readings from Last  1 Encounters:  09/05/14  (1.499 m)    Weight:   Wt Readings from Last 1 Encounters:  09/06/14 108 lb 11 oz (49.3 kg)    Ideal Body Weight:  44.5 kg  BMI:  Body mass index is 21.94 kg/(m^2).  Estimated Nutritional Needs:   Kcal:  1650-1850  Protein:  65-75 grams  Fluid:  1.6 - 1.8 L/day  EDUCATION NEEDS:   Education needs addressed  Roslyn Smiling, MS, RD, LDN Pager # [phone] After hours/ weekend pager # [phone]

## 2014-09-06 NOTE — Progress Notes (Signed)
PROGRESS NOTE  Melanie Conley ZOX:[id number] DOB: 1990/06/01 DOA: 09/04/2014 PCP: Woodfin Ganja, MD  HPI/Recap of past 50 hours: 24 year old female with past medical history of autoimmune hepatitis and primary sclerosing cholangitis with cirrhosis admitted on the early morning hours of 8/8 for 2 days of diffuse abdominal pain along with worsening ascites and found to have diffuse colitis.  Patient started on IV antibiotics plus fluid. She underwent a paracentesis of which results are pending. Gastroenterology consulted. Patient's hemoglobin which normally ranges from 8.3-10.2 dropped from admitting hemoglobin of 8.0 checked at 11 PM on 8/7 to 6.5 this morning at 8 AM on 8/8. She was also noted to be somewhat confused on admission with ammonia level of 128.  Paracentesis done on 8/8 ruled out bacterial peritonitis.  By 8/9, patient feeling much better. Tolerating full liquids. Hemoglobin improved from 6.5 to 8.7.  Initial plan had been to discharge patient today because of marked improvement and that she had a follow-up appointment with her gastroenterologist at [facility] on 8/10. By afternoon, patient slightly confused. Ammonia level checked and improved from previous day, although still elevated. Also, it was found that her appointment at [facility] was with the transplant team for initial evaluation and this was not until 8/17. She had does not have an appointment with her gastroenterologist until middle of September.   Assessment/Plan: Principal Problem:   Colitis: Unclear etiology. May be viral versus CMV which patient has a history of. Continue to monitor. Once SBP ruled out, patient changed over to Cipro and Flagyl Active Problems:   GI bleeding with acute blood loss anemia: Patient reported history of throwing up blood at home. Transfusing 2 units packed red blood cells. Hemoglobin much improved, follow-up tomorrow   Protein-calorie malnutrition, severe: Nutrition consult in place   Hypokalemia:  Replacing   Hypoalbuminemia and hyperbilirubinemia: Secondary to liver disease    Primary sclerosing cholangitis with secondary cirrhosis: Being followed by GI at [facility].    Hepatic encephalopathy: Continue lactulose, improved from yesterday. Continue lactulose and recheck level in the morning    Code Status: full code   Family Communication: left message with mother  Disposition Plan: Anticipate discharge tomorrow if patient is lucid, hemoglobin stable and can tolerate solid food   Consultants:  Gastroenterology   Procedures:  Status post thoracentesis done 8/8  Status post 2 units packed red blood cells on 8/8   Antibiotics:  IV Flagyl 8/8-present  IV Rocephin 8/8-8/9  IV Cipro 8/9-present  Valcyte 8/8-present    Objective: BP 106/61 mmHg  Pulse 53  Temp(Src) 98.2 F (36.8 C) (Oral)  Resp 16  Ht  (1.499 m)  Wt 49.3 kg (108 lb 11 oz)  BMI 21.94 kg/m2  SpO2 100%  Intake/Output Summary (Last 24 hours) at 09/06/14 1925 Last data filed at 09/06/14 1100  Gross per 24 hour  Intake   1648 ml  Output      0 ml  Net   1648 ml   Filed Weights   09/04/14 2038 09/05/14 1530 09/06/14 0500  Weight: 44.906 kg (99 lb) 44.5 kg (98 lb 1.7 oz) 49.3 kg (108 lb 11 oz)    Exam:   General:  Alert and oriented 2, no acute distress  Cardiovascular: regular rate and rhythm, S1-S2   Respiratory: clear to auscultation bilaterally   Abdomen: Soft, mild distention, hypoactive bowel sounds, nontender  Musculoskeletal: No clubbing or cyanosis or edema  Data Reviewed: Basic Metabolic Panel:  Recent Labs Lab 09/04/14 2053 09/05/14 0733 09/06/14  0925  NA 135 135 131*  K 2.5* 3.0* 3.3*  CL 97* 98* 99*  CO2 29 29 24   GLUCOSE 107* 110* 130*  BUN 12 11 7   CREATININE 0.66 0.64 0.63  CALCIUM 8.6* 8.1* 8.4*  MG  --  1.6*  --   PHOS  --  2.8  --    Liver Function Tests:  Recent Labs Lab 09/04/14 2053 09/05/14 0733  AST 57* 42*  ALT 41 34  ALKPHOS 465* 381*   BILITOT 7.4* 5.7*  PROT 6.2* 5.2*  ALBUMIN 2.8* 2.4*    Recent Labs Lab 09/04/14 2053  LIPASE 64*    Recent Labs Lab 09/05/14 0215 09/06/14 1513  AMMONIA 128* 71*   CBC:  Recent Labs Lab 09/04/14 2053 09/05/14 0808 09/06/14 0822 09/06/14 0925  WBC 8.8 5.0 4.2 6.2  NEUTROABS  --  3.4  --   --   HGB 8.0* 6.5* 8.7* 9.6*  HCT 24.8* 20.5* 25.9* 28.9*  MCV 96.9 99.0 93.5 93.5  PLT 145* 91* 53* 63*   Cardiac Enzymes:   No results for input(s): CKTOTAL, CKMB, CKMBINDEX, TROPONINI in the last 168 hours. BNP (last 3 results) No results for input(s): BNP in the last 8760 hours.  ProBNP (last 3 results) No results for input(s): PROBNP in the last 8760 hours.  CBG: No results for input(s): GLUCAP in the last 168 hours.  Recent Results (from the past 240 hour(s))  Culture, body fluid-bottle     Status: None (Preliminary result)   Collection Time: 09/05/14  9:50 AM  Result Value Ref Range Status   Specimen Description FLUID PERITONEAL  Final   Special Requests NONE  Final   Culture NO GROWTH 1 DAY  Final   Report Status PENDING  Incomplete  Gram stain     Status: None   Collection Time: 09/05/14  9:50 AM  Result Value Ref Range Status   Specimen Description FLUID PERITONEAL  Final   Special Requests NONE  Final   Gram Stain   Final    FEW WBC PRESENT,BOTH PMN AND MONONUCLEAR NO ORGANISMS SEEN    Report Status 09/05/2014 FINAL  Final  MRSA PCR Screening     Status: None   Collection Time: 09/05/14  4:00 PM  Result Value Ref Range Status   MRSA by PCR NEGATIVE NEGATIVE Final    Comment:        The GeneXpert MRSA Assay (FDA approved for NASAL specimens only), is one component of a comprehensive MRSA colonization surveillance program. It is not intended to diagnose MRSA infection nor to guide or monitor treatment for MRSA infections.   C difficile quick scan w PCR reflex     Status: Abnormal   Collection Time: 09/05/14  8:15 PM  Result Value Ref Range  Status   C Diff antigen POSITIVE (A) NEGATIVE Final   C Diff toxin POSITIVE (A) NEGATIVE Final   C Diff interpretation Positive for toxigenic C. difficile  Final     Studies: No results found.  Scheduled Meds: . sodium chloride   Intravenous Once  . cefTRIAXone (ROCEPHIN)  IV  1 g Intravenous Q24H  . feeding supplement (ENSURE ENLIVE)  237 mL Oral BID BM  . hydrocortisone sod succinate (SOLU-CORTEF) inj  50 mg Intravenous Q8H  . lactulose  30 g Oral TID  . metronidazole  500 mg Intravenous Q8H  . [START ON 09/08/2014] pantoprazole (PROTONIX) IV  40 mg Intravenous Q12H  . potassium chloride SA  40 mEq  Oral Once  . sodium chloride  3 mL Intravenous Q12H  . ursodiol  300 mg Oral BID  . valGANciclovir  900 mg Oral Daily    Continuous Infusions:     Time spent: 35 minutes  Hollice Espy  [facility] Pager [phone]. If 7PM-7AM, please contact night-coverage at [email or web address], password [facility] 09/06/2014, 7:25 PM  LOS: 1 day

## 2014-09-06 NOTE — Progress Notes (Signed)
Patient ID: Melanie Conley, female   DOB: 1990-10-27, 24 y.o.   MRN: [id number] [facility] Gastroenterology Progress Note  Melanie Conley 24 y.o. 11-03-1990   Subjective: Feels better today. No abdominal pain. S/P paracentesis yesterday with 2L removed (negative for SBP). Loose black stool last night. On clear liquids.  Objective: Vital signs in last 24 hours: Filed Vitals:   09/06/14 0700  BP: 109/63  Pulse: 69  Temp: 98 F (36.7 C)  Resp: 23    Physical Exam: Gen: alert, no acute distress HEENT: +scleral icterus CV: RRR Chest: CTA B Abd: soft, nontender, nondistended, +BS Ext: pulses intact, no edema  Lab Results:  Recent Labs  09/04/14 2053 09/05/14 0733  NA 135 135  K 2.5* 3.0*  CL 97* 98*  CO2 29 29  GLUCOSE 107* 110*  BUN 12 11  CREATININE 0.66 0.64  CALCIUM 8.6* 8.1*  MG  --  1.6*  PHOS  --  2.8    Recent Labs  09/04/14 2053 09/05/14 0733  AST 57* 42*  ALT 41 34  ALKPHOS 465* 381*  BILITOT 7.4* 5.7*  PROT 6.2* 5.2*  ALBUMIN 2.8* 2.4*    Recent Labs  09/05/14 0808 09/06/14 0822  WBC 5.0 4.2  NEUTROABS 3.4  --   HGB 6.5* 8.7*  HCT 20.5* 25.9*  MCV 99.0 93.5  PLT 91* 53*    Recent Labs  09/05/14 0733  LABPROT 19.9*  INR 1.69*      Assessment/Plan: S/P GI bleed likely from colitis. Doing better on antibiotics and supportive care. No SBP on ascitic fluid. No clinical hepatic encephalopathy. Patient reports having a f/u at [facility] tomorrow morning and is reportedly on the liver transplant list. Patient stable to be discharged today with close follow up at [facility] for further management as planned. D/C on Abx. Replace K. Clear liquid diet and NPO p MN since EGD reportedly planned for tomorrow at [facility]. D/W Dr. Rito Ehrlich.   Melanie Conley C. 09/06/2014, 9:17 AM  Pager [phone]  If no answer or after 5 PM call [phone]

## 2014-09-06 NOTE — Progress Notes (Signed)
Report received for transfer to 5W06 

## 2014-09-07 DIAGNOSIS — K2961 Other gastritis with bleeding: Secondary | ICD-10-CM

## 2014-09-07 LAB — COMPREHENSIVE METABOLIC PANEL
ALK PHOS: 330 U/L — AB (ref 38–126)
ALT: 26 U/L (ref 14–54)
AST: 29 U/L (ref 15–41)
Albumin: 2.2 g/dL — ABNORMAL LOW (ref 3.5–5.0)
Anion gap: 4 — ABNORMAL LOW (ref 5–15)
BUN: 8 mg/dL (ref 6–20)
CO2: 26 mmol/L (ref 22–32)
Calcium: 8.2 mg/dL — ABNORMAL LOW (ref 8.9–10.3)
Chloride: 101 mmol/L (ref 101–111)
Creatinine, Ser: 0.61 mg/dL (ref 0.44–1.00)
GFR calc Af Amer: >60 mL/min (ref >60)
GFR calc non Af Amer: >60 mL/min (ref >60)
Glucose, Bld: 130 mg/dL — ABNORMAL HIGH (ref 65–99)
POTASSIUM: 3.9 mmol/L (ref 3.5–5.1)
SODIUM: 131 mmol/L — AB (ref 135–145)
TOTAL PROTEIN: 4.8 g/dL — AB (ref 6.5–8.1)
Total Bilirubin: 3.8 mg/dL — ABNORMAL HIGH (ref 0.3–1.2)

## 2014-09-07 LAB — CBC
HCT: 26.7 % — ABNORMAL LOW (ref 36.0–46.0)
HEMOGLOBIN: 8.9 g/dL — AB (ref 12.0–15.0)
MCH: 31.3 pg (ref 26.0–34.0)
MCHC: 33.3 g/dL (ref 30.0–36.0)
MCV: 94 fL (ref 78.0–100.0)
PLATELETS: 52 10*3/uL — AB (ref 150–400)
RBC: 2.84 MIL/uL — ABNORMAL LOW (ref 3.87–5.11)
RDW: 18.1 % — ABNORMAL HIGH (ref 11.5–15.5)
WBC: 5.7 10*3/uL (ref 4.0–10.5)

## 2014-09-07 LAB — AMMONIA: AMMONIA: 165 umol/L — AB (ref 9–35)

## 2014-09-07 MED ORDER — KETOROLAC TROMETHAMINE 30 MG/ML IJ SOLN
30.0000 mg | Freq: Once | INTRAMUSCULAR | Status: AC
  Administered 2014-09-07: 30 mg via INTRAVENOUS
  Filled 2014-09-07: qty 1

## 2014-09-07 MED ORDER — WHITE PETROLATUM GEL
Status: DC
Start: 2014-09-07 — End: 2014-09-07
  Filled 2014-09-07: qty 1

## 2014-09-07 MED ORDER — METRONIDAZOLE 500 MG PO TABS
500.0000 mg | ORAL_TABLET | Freq: Three times a day (TID) | ORAL | Status: DC
  Administered 2014-09-07: 500 mg via ORAL
  Filled 2014-09-07: qty 1

## 2014-09-07 MED ORDER — LACTULOSE 10 GM/15ML PO SOLN: 20.0000 g | Freq: Three times a day (TID) | ORAL | Status: DC

## 2014-09-07 MED ORDER — PANTOPRAZOLE SODIUM 40 MG PO TBEC
40.0000 mg | DELAYED_RELEASE_TABLET | Freq: Two times a day (BID) | ORAL | Status: DC
  Administered 2014-09-07: 40 mg via ORAL
  Filled 2014-09-07: qty 1

## 2014-09-07 MED ORDER — ENSURE ENLIVE PO LIQD: 237.0000 mL | Freq: Two times a day (BID) | ORAL | Status: DC

## 2014-09-07 NOTE — Progress Notes (Signed)
Pt being discharged to home per MD order. Discharge orders reviewed with pt.  All questions answered.  Instructions given along with care notes for new medications and new diagnosis. IV d/c. Catheter intact.  Skin was clean, dry, and intact and patient will be transferred via wheelchair to car by nurse tech.

## 2014-09-07 NOTE — Progress Notes (Signed)
Patient ID: Melanie Conley, female   DOB: [birth year], 24 y.o.   MRN: [id number] [facility] Gastroenterology Progress Note  [name] 24 y.o. 1990/10/13   Subjective: Feels good. Denies any further bleeding. Minimal abdominal pain. Excited about going home.  Objective: Vital signs in last 24 hours: Filed Vitals:   09/07/14 0522  BP: 99/51  Pulse: 50  Temp: 97.8 F (36.6 C)  Resp: 20    Physical Exam: Gen: alert, no acute distress CV: RRR Chest: CTA B Abd: minimal abdominal tenderness with minimal guarding, soft, nondistended, +BS Ext: no edema Neuro: no asterixis  Lab Results:  Recent Labs  09/05/14 0733 09/06/14 0925 09/07/14 0547  NA 135 131* 131*  K 3.0* 3.3* 3.9  CL 98* 99* 101  CO2 GLUCOSE 110* 130* 130*  BUN CREATININE 0.64 0.63 0.61  CALCIUM 8.1* 8.4* 8.2*  MG 1.6*  --   --   PHOS 2.8  --   --     Recent Labs  09/05/14 0733 09/07/14 0547  AST 42* 29  ALT 34 26  ALKPHOS 381* 330*  BILITOT 5.7* 3.8*  PROT 5.2* 4.8*  ALBUMIN 2.4* 2.2*    Recent Labs  09/05/14 0808  09/06/14 0925 09/07/14 0547  WBC 5.0  < > 6.2 5.7  NEUTROABS 3.4  --   --   --   HGB 6.5*  < > 9.6* 8.9*  HCT 20.5*  < > 28.9* 26.7*  MCV 99.0  < > 93.5 94.0  PLT 91*  < > 63* 52*  < > = values in this interval not displayed.  Recent Labs  09/05/14 0733  LABPROT 19.9*  INR 1.69*      Assessment/Plan: Cirrhosis with colitis on CT and recent GI bleed. Clinically no hepatic encephalopathy. Stable for d/c home and f/u at [facility] this month as scheduled. Will sign off. Call if questions.   [name] 09/07/2014, 12:22 PM  Pager [phone]  If no answer or after 5 PM call [phone]

## 2014-09-07 NOTE — Discharge Summary (Signed)
Discharge Summary  Melanie Conley:[id number] DOB: 09-09-1990  PCP: Woodfin Ganja, MD  Admit date: 09/04/2014 Discharge date: 09/07/2014  Time spent: 25 minutes  Recommendations for Outpatient Follow-up:  1. New medication: Cipro 500 mg by mouth twice a day 4 more days 2. New medication: Flagyl 500 mg by mouth every 8 hours 4 more days  3. Medication change: Patient previously taking lactulose 20 cc twice a day when necessary. Advised her that she needs to take lactulose scheduled twice a day-3 times a day, adjusting for too many loose stools.  4. Patient is a follow up appointment with her gastroenterologist, Dr. Piedad Climes in mid-September. Advising patient to call and try to arrange for closer follow-up 5. Ensure supplement twice a day  Discharge Diagnoses:  Active Hospital Problems   Diagnosis Date Noted  . Colitis 09/05/2014  . Hepatic encephalopathy 09/05/2014  . Acute blood loss anemia 09/05/2014  . Primary sclerosing cholangitis   . Hypoalbuminemia 06/04/2014  . Jaundice   . Hypokalemia   . Protein-calorie malnutrition, severe 01/13/2014  . GI bleeding 07/22/2011    Resolved Hospital Problems   Diagnosis Date Noted Date Resolved  No resolved problems to display.    Discharge Condition: Improved, being discharged home  Diet recommendation: Low-sodium with ensure supplementation twice a day  Filed Weights   09/05/14 1530 09/06/14 0500 09/07/14 0522  Weight: 44.5 kg (98 lb 1.7 oz) 49.3 kg (108 lb 11 oz) 50.168 kg (110 lb 9.6 oz)    History of present illness:  24 year old female with past medical history of autoimmune hepatitis and primary sclerosing cholangitis with cirrhosis admitted on the early morning hours of 8/8 for 2 days of diffuse abdominal pain along with worsening ascites and found to have diffuse colitis. Patient started on IV antibiotics plus fluid. She underwent a paracentesis of which results are pending. Gastroenterology consulted. Patient's  hemoglobin which normally ranges from 8.3-10.2 dropped from admitting hemoglobin of 8.0 checked at 11 PM on 8/7 to 6.5 this morning at 8 AM on 8/8. She was also noted to be somewhat confused on admission with ammonia level of 128. Paracentesis done on 8/8 ruled out bacterial peritonitis.  Hospital Course:  Principal Problem:   Colitis: Unclear etiology. Possibly viral versus CME which patient does have a history of. Continues on Valcyte. Initially put on Rocephin and Flagyl and once SBP ruled out, change over to Cipro and Flagyl for possible infectious colitis. By 8/9, patient markedly better and able to tolerate solid food. Active Problems:   GI bleeding with acute blood loss anemia: Patient transfused 2 units packed red blood cells. She had no further bleeding episodes. Hemoglobin initially at 8.0 (hemoglobin range baseline 8.3-10.2) dropped in 9 hours down to 6.5. Seen by GI and given overall stability and rapid improvement, they opted not for EGD. Patient continued on PPI. Advised to avoid NSAIDs. She'll follow-up with GI at [facility] as outpatient.    Protein-calorie malnutrition, severe: Evaluated by nutrition. Started on ensure twice a day   Hypokalemia/hypoalbuminemia/thrombocytopenia: Secondary to liver disease. Stable    Primary sclerosing cholangitis with secondary cirrhosis patient has scheduled appointment next week with transplant team:    Hepatic encephalopathy: Ammonia level on admission at 128 and patient somewhat lethargic. Started on scheduled lactulose and by following day was much more alert. There was some concern as she initially had mention that she had a focal appointment with GI doctor on 8/10. When the records were reviewed, show she does not have a follow-up appointment  in mid-September but does have a follow up appointment with the transplant team coordinator next week. Her mother had concerns about the patient's lucidity. Even though the patient appeared lucid at the time of  conversation, it was agreed the patient would stay one additional day, continue on lactulose. The following day, her mentation was assessed and she was found alert and oriented 4 and capable of making her own decisions. At this point hemoglobin stable, she is tolerating solid by mouth-colitis appeared to be resolved and patient not having any encephalopathy so felt to be stable for discharge.    Consultants:  Gastroenterology  Procedures:  Status post thoracentesis done 8/8  Status post 2 units packed red blood cells on 8/8  Discharge Exam: BP 108/66 mmHg  Pulse 66  Temp(Src) 97.9 F (36.6 C) (Oral)  Resp 18  Ht 4\' 11"  (1.499 m)  Wt 50.168 kg (110 lb 9.6 oz)  BMI 22.33 kg/m2  SpO2 100%  General: Alert and oriented 4, patient evaluated prior to discharge and is clearly fully lucid Cardiovascular: Regular rate and rhythm, S1-S2 Respiratory: Clear to auscultation bilaterally  Discharge Instructions You were cared for by a hospitalist during your hospital stay. If you have any questions about your discharge medications or the care you received while you were in the hospital after you are discharged, you can call the unit and asked to speak with the hospitalist on call if the hospitalist that took care of you is not available. Once you are discharged, your primary care physician will handle any further medical issues. Please note that NO REFILLS for any discharge medications will be authorized once you are discharged, as it is imperative that you return to your primary care physician (or establish a relationship with a primary care physician if you do not have one) for your aftercare needs so that they can reassess your need for medications and monitor your lab values.  Discharge Instructions    Diet - low sodium heart healthy    Complete by:  As directed      Increase activity slowly    Complete by:  As directed             Medication List    TAKE these medications         albuterol (2.5 MG/3ML) 0.083% nebulizer solution  Commonly known as:  PROVENTIL  Take 2.5 mg by nebulization every 4 (four) hours as needed for wheezing or shortness of breath.     carvedilol 3.125 MG tablet  Commonly known as:  COREG  Take 3.125 mg by mouth 2 (two) times daily.     ciprofloxacin 500 MG tablet  Commonly known as:  CIPRO  Take 1 tablet (500 mg total) by mouth 2 (two) times daily.     furosemide 40 MG tablet  Commonly known as:  LASIX  Take 1 tablet (40 mg total) by mouth daily.     IMURAN 50 MG tablet  Generic drug:  azaTHIOprine  Take 50 mg by mouth daily.     lactose free nutrition Liqd  Take 237 mLs by mouth daily.     feeding supplement (ENSURE ENLIVE) Liqd  Take 237 mLs by mouth 2 (two) times daily between meals.     lactulose 10 GM/15ML solution  Commonly known as:  CHRONULAC  Take 30 mLs (20 g total) by mouth 3 (three) times daily.     lidocaine 2 % solution  Commonly known as:  XYLOCAINE  Use as directed 200  mg in the mouth or throat 3 (three) times daily as needed. For GI / esophageal upset     metroNIDAZOLE 500 MG tablet  Commonly known as:  FLAGYL  Take 1 tablet (500 mg total) by mouth 3 (three) times daily.     omeprazole 40 MG capsule  Commonly known as:  PRILOSEC  Take 40 mg by mouth daily.     potassium chloride 10 MEQ tablet  Commonly known as:  K-DUR  Take 1 tablet (10 mEq total) by mouth once.     predniSONE 5 MG tablet  Commonly known as:  DELTASONE  Take 5 mg by mouth daily.     Protein Powd  Take 1 scoop by mouth 2 (two) times daily.     REGLAN 5 MG tablet  Generic drug:  metoCLOPramide  Take 5 mg by mouth 2 (two) times daily. 30 minutes before a meal     spironolactone 50 MG tablet  Commonly known as:  ALDACTONE  Take 100 mg by mouth daily.     ursodiol 300 MG capsule  Commonly known as:  ACTIGALL  Take 300 mg by mouth 2 (two) times daily.     valGANciclovir 450 MG tablet  Commonly known as:  VALCYTE  Take 900 mg  by mouth daily.       No Known Allergies    The results of significant diagnostics from this hospitalization (including imaging, microbiology, ancillary and laboratory) are listed below for reference.    Significant Diagnostic Studies: Ct Abdomen Pelvis W Contrast  09/05/2014   CLINICAL DATA:  Acute onset of periumbilical abdominal pain. Initial encounter.  EXAM: CT ABDOMEN AND PELVIS WITH CONTRAST  TECHNIQUE: Multidetector CT imaging of the abdomen and pelvis was performed using the standard protocol following bolus administration of intravenous contrast.  CONTRAST:  75mL OMNIPAQUE IOHEXOL 300 MG/ML  SOLN  COMPARISON:  CT of the abdomen and pelvis performed 08/08/2014  FINDINGS: The visualized lung bases are clear.  Large volume ascites is again noted within the abdomen and pelvis, perhaps slightly decreased from the prior study.  Underlying changes of hepatic cirrhosis are again noted, with slight nodularity of the hepatic contour. 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 Splenomegaly is again noted. The spleen measures 16.3 cm in length. Scattered splenic, gastric and esophageal varices are again noted. The portal venous system appears grossly patent.  The pancreas and adrenal glands are grossly unremarkable in appearance. The kidneys are unremarkable in appearance. No perinephric stranding is appreciated. No renal or ureteral stones are identified. There is no evidence of hydronephrosis.  No free fluid is identified. The small bowel is unremarkable in appearance. The stomach is within normal limits. No acute vascular abnormalities are seen.  The appendix is normal in caliber and contains air, without evidence of appendicitis. There is new mucosal edema involving the entirety of the colon. Though this may reflect hypoproteinemia or other systemic condition, mild infectious or inflammatory colitis cannot be excluded.  The bladder is mildly distended and grossly unremarkable. Scattered calcification is  noted along a 1.4 cm urachal nodule. The uterus is unremarkable in appearance. The ovaries are relatively symmetric. No suspicious adnexal masses are seen. No inguinal lymphadenopathy is seen.  No acute osseous abnormalities are identified.  IMPRESSION: 1. New mucosal edema involving the entirety of the colon. Though this may reflect hypoproteinemia or other systemic condition, mild infectious or inflammatory colitis cannot be excluded. 2. Large volume ascites again noted within the abdomen and pelvis, perhaps slightly decreased  from the prior study. 3. Underlying changes of hepatic cirrhosis again noted, with splenomegaly. Multiple splenic infarcts again seen. Scattered splenic, gastric and esophageal varices again noted. 4. 1.4 cm urachal nodule, with scattered constipation, appears grossly stable from prior studies.   Electronically Signed   By: Roanna Raider M.D.   On: 09/05/2014 01:57   US Paracentesis  09/05/2014   INDICATION: Cirrhosis, recurrent ascites and request for paracentesis.  EXAM: ULTRASOUND-GUIDED PARACENTESIS  COMPARISON:  Paracentesis 07/30/14.  MEDICATIONS: None.  COMPLICATIONS: None immediate  TECHNIQUE: Informed written consent was obtained from the patient after a discussion of the risks, benefits and alternatives to treatment. A timeout was performed prior to the initiation of the procedure.  Initial ultrasound scanning demonstrates a small amount of ascites within the left lower abdominal quadrant. The left lower abdomen was prepped and draped in the usual sterile fashion. 1% lidocaine was used for local anesthesia.  Under direct ultrasound guidance, a 19 gauge, 7-cm, Yueh catheter was introduced. An ultrasound image was saved for documentation purposed. The paracentesis was performed. The catheter was removed and a dressing was applied. The patient tolerated the procedure well without immediate post procedural complication.  FINDINGS: A total of approximately 2 liters of serous fluid was  removed. Samples were sent to the laboratory as requested by the clinical team.  IMPRESSION: Successful ultrasound-guided paracentesis yielding 2 liters of peritoneal fluid.  Read By:  Pattricia Boss PA-C   Electronically Signed   By: Jolaine Click M.D.   On: 09/05/2014 10:31   Dg Abd Acute W/chest  08/11/2014   CLINICAL DATA:  Abdominal pain onset 2 days ago. History of ascites. History of primary sclerosing cholangitis on transplant list.  EXAM: DG ABDOMEN ACUTE W/ 1V CHEST  COMPARISON:  CT abdomen and pelvis 08/08/2014  FINDINGS: Shallow inspiration with linear atelectasis in the lung bases. Normal heart size and pulmonary vascularity. No focal airspace disease or consolidation in the lungs.  Normal bowel gas pattern with scattered gas and stool in the colon. No free intra-abdominal air. No abnormal air-fluid levels. Diffusely increased density throughout the abdomen consistent with history of ascites. No radiopaque stones. Bones appear intact.  IMPRESSION: Shallow inspiration with linear atelectasis in the lung bases. Abdominal appearance consistent with ascites. Nonobstructive bowel gas pattern.   Electronically Signed   By: Burman Nieves M.D.   On: 08/11/2014 03:50    Microbiology: Recent Results (from the past 240 hour(s))  Culture, body fluid-bottle     Status: None (Preliminary result)   Collection Time: 09/05/14  9:50 AM  Result Value Ref Range Status   Specimen Description FLUID PERITONEAL  Final   Special Requests NONE  Final   Culture NO GROWTH 2 DAYS  Final   Report Status PENDING  Incomplete  Gram stain     Status: None   Collection Time: 09/05/14  9:50 AM  Result Value Ref Range Status   Specimen Description FLUID PERITONEAL  Final   Special Requests NONE  Final   Gram Stain   Final    FEW WBC PRESENT,BOTH PMN AND MONONUCLEAR NO ORGANISMS SEEN    Report Status 09/05/2014 FINAL  Final  MRSA PCR Screening     Status: None   Collection Time: 09/05/14  4:00 PM  Result Value  Ref Range Status   MRSA by PCR NEGATIVE NEGATIVE Final    Comment:        The GeneXpert MRSA Assay (FDA approved for NASAL specimens only), is one component of  a comprehensive MRSA colonization surveillance program. It is not intended to diagnose MRSA infection nor to guide or monitor treatment for MRSA infections.   C difficile quick scan w PCR reflex     Status: Abnormal   Collection Time: 09/05/14  8:15 PM  Result Value Ref Range Status   C Diff antigen POSITIVE (A) NEGATIVE Final   C Diff toxin POSITIVE (A) NEGATIVE Final   C Diff interpretation Positive for toxigenic C. difficile  Final     Labs: Basic Metabolic Panel:  Recent Labs Lab 09/04/14 2053 09/05/14 0733 09/06/14 0925 09/07/14 0547  NA 135 135 131* 131*  K 2.5* 3.0* 3.3* 3.9  CL 97* 98* 99* 101  CO2 GLUCOSE 107* 110* 130* 130*  BUN CREATININE 0.66 0.64 0.63 0.61  CALCIUM 8.6* 8.1* 8.4* 8.2*  MG  --  1.6*  --   --   PHOS  --  2.8  --   --    Liver Function Tests:  Recent Labs Lab 09/04/14 2053 09/05/14 0733 09/07/14 0547  AST 57* 42* 29  ALT 41 34 26  ALKPHOS 465* 381* 330*  BILITOT 7.4* 5.7* 3.8*  PROT 6.2* 5.2* 4.8*  ALBUMIN 2.8* 2.4* 2.2*    Recent Labs Lab 09/04/14 2053  LIPASE 64*    Recent Labs Lab 09/05/14 0215 09/06/14 1513 09/07/14 0547  AMMONIA 128* 71* 165*   CBC:  Recent Labs Lab 09/04/14 2053 09/05/14 0808 09/06/14 0822 09/06/14 0925 09/07/14 0547  WBC 8.8 5.0 4.2 6.2 5.7  NEUTROABS  --  3.4  --   --   --   HGB 8.0* 6.5* 8.7* 9.6* 8.9*  HCT 24.8* 20.5* 25.9* 28.9* 26.7*  MCV 96.9 99.0 93.5 93.5 94.0  PLT 145* 91* 53* 63* 52*   Cardiac Enzymes: No results for input(s): CKTOTAL, CKMB, CKMBINDEX, TROPONINI in the last 168 hours. BNP: BNP (last 3 results) No results for input(s): BNP in the last 8760 hours.  ProBNP (last 3 results) No results for input(s): PROBNP in the last 8760 hours.  CBG: No results for input(s): GLUCAP in  the last 168 hours.     Signed:  Hollice Espy  [facility] 09/07/2014, 3:07 PM

## 2014-09-07 NOTE — Care Management Note (Signed)
Case Management Note  Patient Details  Name: Melanie Conley MRN: [id number] Date of Birth: 03-18-1990  Subjective/Objective:        Patient is for dc today, no needs.             Action/Plan:   Expected Discharge Date:                  Expected Discharge Plan:  Home/Self Care  In-House Referral:     Discharge planning Services  CM Consult  Post Acute Care Choice:    Choice offered to:     DME Arranged:    DME Agency:     HH Arranged:    HH Agency:     Status of Service:  Completed, signed off  Medicare Important Message Given:    Date Medicare IM Given:    Medicare IM give by:    Date Additional Medicare IM Given:    Additional Medicare Important Message give by:     If discussed at Long Length of Stay Meetings, dates discussed:    Additional Comments:  Leone Haven, RN 09/07/2014, 12:47 PM

## 2014-09-08 LAB — TOTAL BILIRUBIN, BODY FLUID: Total bilirubin, fluid: 0.2 mg/dL

## 2014-09-10 LAB — CULTURE, BODY FLUID W GRAM STAIN -BOTTLE

## 2014-09-10 LAB — CULTURE, BODY FLUID-BOTTLE: CULTURE: NO GROWTH

## 2014-09-11 ENCOUNTER — Emergency Department (HOSPITAL_COMMUNITY): Payer: BC Managed Care – PPO

## 2014-09-11 ENCOUNTER — Encounter (HOSPITAL_COMMUNITY): Payer: Self-pay | Admitting: Emergency Medicine

## 2014-09-11 ENCOUNTER — Inpatient Hospital Stay (HOSPITAL_COMMUNITY)
Admission: EM | Admit: 2014-09-11 | Discharge: 2014-09-13 | DRG: 853 | Payer: BC Managed Care – PPO | Attending: Pulmonary Disease | Admitting: Pulmonary Disease

## 2014-09-11 ENCOUNTER — Encounter (HOSPITAL_COMMUNITY)
Admission: EM | Disposition: A | Discharge status: Other Acute Inpatient Hospital | Payer: Self-pay | Source: Home / Self Care | Attending: Internal Medicine

## 2014-09-11 ENCOUNTER — Inpatient Hospital Stay (HOSPITAL_COMMUNITY): Payer: BC Managed Care – PPO

## 2014-09-11 DIAGNOSIS — R17 Unspecified jaundice: Secondary | ICD-10-CM

## 2014-09-11 DIAGNOSIS — K7460 Unspecified cirrhosis of liver: Secondary | ICD-10-CM

## 2014-09-11 DIAGNOSIS — J9602 Acute respiratory failure with hypercapnia: Secondary | ICD-10-CM

## 2014-09-11 DIAGNOSIS — D62 Acute posthemorrhagic anemia: Secondary | ICD-10-CM | POA: Diagnosis present

## 2014-09-11 DIAGNOSIS — D709 Neutropenia, unspecified: Secondary | ICD-10-CM

## 2014-09-11 DIAGNOSIS — K754 Autoimmune hepatitis: Secondary | ICD-10-CM

## 2014-09-11 DIAGNOSIS — R579 Shock, unspecified: Secondary | ICD-10-CM

## 2014-09-11 DIAGNOSIS — A419 Sepsis, unspecified organism: Secondary | ICD-10-CM | POA: Diagnosis present

## 2014-09-11 DIAGNOSIS — I8500 Esophageal varices without bleeding: Secondary | ICD-10-CM | POA: Diagnosis not present

## 2014-09-11 DIAGNOSIS — K830 Cholangitis: Secondary | ICD-10-CM | POA: Diagnosis present

## 2014-09-11 DIAGNOSIS — R739 Hyperglycemia, unspecified: Secondary | ICD-10-CM | POA: Diagnosis not present

## 2014-09-11 DIAGNOSIS — G9341 Metabolic encephalopathy: Secondary | ICD-10-CM | POA: Diagnosis present

## 2014-09-11 DIAGNOSIS — K625 Hemorrhage of anus and rectum: Secondary | ICD-10-CM

## 2014-09-11 DIAGNOSIS — E872 Acidosis: Secondary | ICD-10-CM | POA: Diagnosis not present

## 2014-09-11 DIAGNOSIS — K7290 Hepatic failure, unspecified without coma: Secondary | ICD-10-CM | POA: Diagnosis present

## 2014-09-11 DIAGNOSIS — B259 Cytomegaloviral disease, unspecified: Secondary | ICD-10-CM | POA: Diagnosis present

## 2014-09-11 DIAGNOSIS — K2961 Other gastritis with bleeding: Secondary | ICD-10-CM | POA: Diagnosis not present

## 2014-09-11 DIAGNOSIS — K7210 Chronic hepatic failure without coma: Secondary | ICD-10-CM | POA: Diagnosis not present

## 2014-09-11 DIAGNOSIS — K922 Gastrointestinal hemorrhage, unspecified: Secondary | ICD-10-CM | POA: Diagnosis not present

## 2014-09-11 DIAGNOSIS — D689 Coagulation defect, unspecified: Secondary | ICD-10-CM

## 2014-09-11 DIAGNOSIS — E876 Hypokalemia: Secondary | ICD-10-CM

## 2014-09-11 DIAGNOSIS — R571 Hypovolemic shock: Secondary | ICD-10-CM | POA: Diagnosis present

## 2014-09-11 DIAGNOSIS — A047 Enterocolitis due to Clostridium difficile: Secondary | ICD-10-CM | POA: Diagnosis not present

## 2014-09-11 DIAGNOSIS — R1084 Generalized abdominal pain: Secondary | ICD-10-CM

## 2014-09-11 DIAGNOSIS — D649 Anemia, unspecified: Secondary | ICD-10-CM

## 2014-09-11 DIAGNOSIS — K7469 Other cirrhosis of liver: Secondary | ICD-10-CM

## 2014-09-11 DIAGNOSIS — J9601 Acute respiratory failure with hypoxia: Secondary | ICD-10-CM

## 2014-09-11 DIAGNOSIS — F17210 Nicotine dependence, cigarettes, uncomplicated: Secondary | ICD-10-CM | POA: Diagnosis not present

## 2014-09-11 DIAGNOSIS — R6520 Severe sepsis without septic shock: Secondary | ICD-10-CM | POA: Diagnosis not present

## 2014-09-11 DIAGNOSIS — E871 Hypo-osmolality and hyponatremia: Secondary | ICD-10-CM | POA: Diagnosis not present

## 2014-09-11 DIAGNOSIS — K7200 Acute and subacute hepatic failure without coma: Secondary | ICD-10-CM | POA: Diagnosis present

## 2014-09-11 DIAGNOSIS — Z79899 Other long term (current) drug therapy: Secondary | ICD-10-CM

## 2014-09-11 DIAGNOSIS — R34 Anuria and oliguria: Secondary | ICD-10-CM | POA: Diagnosis not present

## 2014-09-11 DIAGNOSIS — E43 Unspecified severe protein-calorie malnutrition: Secondary | ICD-10-CM

## 2014-09-11 DIAGNOSIS — E8809 Other disorders of plasma-protein metabolism, not elsewhere classified: Secondary | ICD-10-CM

## 2014-09-11 DIAGNOSIS — D61818 Other pancytopenia: Secondary | ICD-10-CM

## 2014-09-11 DIAGNOSIS — Z7952 Long term (current) use of systemic steroids: Secondary | ICD-10-CM

## 2014-09-11 DIAGNOSIS — K529 Noninfective gastroenteritis and colitis, unspecified: Secondary | ICD-10-CM

## 2014-09-11 DIAGNOSIS — Z452 Encounter for adjustment and management of vascular access device: Secondary | ICD-10-CM

## 2014-09-11 DIAGNOSIS — K7682 Hepatic encephalopathy: Secondary | ICD-10-CM

## 2014-09-11 DIAGNOSIS — D72819 Decreased white blood cell count, unspecified: Secondary | ICD-10-CM

## 2014-09-11 DIAGNOSIS — R680 Hypothermia, not associated with low environmental temperature: Secondary | ICD-10-CM | POA: Diagnosis not present

## 2014-09-11 DIAGNOSIS — R791 Abnormal coagulation profile: Secondary | ICD-10-CM | POA: Diagnosis present

## 2014-09-11 DIAGNOSIS — K652 Spontaneous bacterial peritonitis: Secondary | ICD-10-CM

## 2014-09-11 DIAGNOSIS — Z9289 Personal history of other medical treatment: Secondary | ICD-10-CM

## 2014-09-11 DIAGNOSIS — I864 Gastric varices: Secondary | ICD-10-CM | POA: Diagnosis not present

## 2014-09-11 DIAGNOSIS — R109 Unspecified abdominal pain: Secondary | ICD-10-CM

## 2014-09-11 DIAGNOSIS — N926 Irregular menstruation, unspecified: Secondary | ICD-10-CM

## 2014-09-11 DIAGNOSIS — K8301 Primary sclerosing cholangitis: Secondary | ICD-10-CM

## 2014-09-11 HISTORY — PX: ESOPHAGOGASTRODUODENOSCOPY: SHX5428

## 2014-09-11 LAB — BLOOD GAS, ARTERIAL
Acid-base deficit: 1.6 mmol/L (ref 0.0–2.0)
Bicarbonate: 22.9 mEq/L (ref 20.0–24.0)
DRAWN BY: 280981
FIO2: 1
MECHVT: 400 mL
O2 SAT: 100 %
PATIENT TEMPERATURE: 98.6
PEEP/CPAP: 5 cmH2O
PO2 ART: 490 mmHg — AB (ref 80.0–100.0)
RATE: 16 resp/min
TCO2: 24.1 mmol/L (ref 0–100)
pCO2 arterial: 40 mmHg (ref 35.0–45.0)
pH, Arterial: 7.376 (ref 7.350–7.450)

## 2014-09-11 LAB — COMPREHENSIVE METABOLIC PANEL
ALBUMIN: 2.6 g/dL — AB (ref 3.5–5.0)
ALT: 60 U/L — ABNORMAL HIGH (ref 14–54)
ANION GAP: 9 (ref 5–15)
AST: 114 U/L — ABNORMAL HIGH (ref 15–41)
Alkaline Phosphatase: 448 U/L — ABNORMAL HIGH (ref 38–126)
BILIRUBIN TOTAL: 7.5 mg/dL — AB (ref 0.3–1.2)
BUN: 10 mg/dL (ref 6–20)
CALCIUM: 7.8 mg/dL — AB (ref 8.9–10.3)
CHLORIDE: 96 mmol/L — AB (ref 101–111)
CO2: 27 mmol/L (ref 22–32)
CREATININE: 0.61 mg/dL (ref 0.44–1.00)
GFR calc Af Amer: >60 mL/min (ref >60)
Glucose, Bld: 115 mg/dL — ABNORMAL HIGH (ref 65–99)
Potassium: 2.8 mmol/L — ABNORMAL LOW (ref 3.5–5.1)
SODIUM: 132 mmol/L — AB (ref 135–145)
Total Protein: 5.7 g/dL — ABNORMAL LOW (ref 6.5–8.1)

## 2014-09-11 LAB — CBC
HCT: 31.6 % — ABNORMAL LOW (ref 36.0–46.0)
HEMATOCRIT: 25.4 % — AB (ref 36.0–46.0)
HEMOGLOBIN: 10.4 g/dL — AB (ref 12.0–15.0)
Hemoglobin: 8.6 g/dL — ABNORMAL LOW (ref 12.0–15.0)
MCH: 30.2 pg (ref 26.0–34.0)
MCH: 29.9 pg (ref 26.0–34.0)
MCHC: 32.9 g/dL (ref 30.0–36.0)
MCHC: 33.9 g/dL (ref 30.0–36.0)
MCV: 91.9 fL (ref 78.0–100.0)
MCV: 88.2 fL (ref 78.0–100.0)
PLATELETS: 137 10*3/uL — AB (ref 150–400)
PLATELETS: 57 10*3/uL — AB (ref 150–400)
RBC: 3.44 MIL/uL — ABNORMAL LOW (ref 3.87–5.11)
RBC: 2.88 MIL/uL — AB (ref 3.87–5.11)
RDW: 17.2 % — ABNORMAL HIGH (ref 11.5–15.5)
RDW: 16.9 % — ABNORMAL HIGH (ref 11.5–15.5)
WBC: 12.6 10*3/uL — ABNORMAL HIGH (ref 4.0–10.5)
WBC: 5.8 10*3/uL (ref 4.0–10.5)

## 2014-09-11 LAB — URINALYSIS, ROUTINE W REFLEX MICROSCOPIC
GLUCOSE, UA: NEGATIVE mg/dL
HGB URINE DIPSTICK: NEGATIVE
Ketones, ur: NEGATIVE mg/dL
Leukocytes, UA: NEGATIVE
NITRITE: NEGATIVE
PH: 6.5 (ref 5.0–8.0)
Protein, ur: NEGATIVE mg/dL
Specific Gravity, Urine: 1.021 (ref 1.005–1.030)
Urobilinogen, UA: 0.2 mg/dL (ref 0.0–1.0)

## 2014-09-11 LAB — MAGNESIUM: Magnesium: 1.8 mg/dL (ref 1.7–2.4)

## 2014-09-11 LAB — LACTIC ACID, PLASMA
LACTIC ACID, VENOUS: 3.2 mmol/L — AB (ref 0.5–2.0)
Lactic Acid, Venous: 3.4 mmol/L (ref 0.5–2.0)

## 2014-09-11 LAB — DIFFERENTIAL
BASOS ABS: 0 10*3/uL (ref 0.0–0.1)
BASOS PCT: 0 % (ref 0–1)
Eosinophils Absolute: 0.1 10*3/uL (ref 0.0–0.7)
Eosinophils Relative: 1 % (ref 0–5)
Lymphocytes Relative: 17 % (ref 12–46)
Lymphs Abs: 2.1 10*3/uL (ref 0.7–4.0)
Monocytes Absolute: 2.3 10*3/uL — ABNORMAL HIGH (ref 0.1–1.0)
Monocytes Relative: 18 % — ABNORMAL HIGH (ref 3–12)
NEUTROS ABS: 8.2 10*3/uL — AB (ref 1.7–7.7)
Neutrophils Relative %: 64 % (ref 43–77)

## 2014-09-11 LAB — CBG MONITORING, ED: GLUCOSE-CAPILLARY: 166 mg/dL — AB (ref 65–99)

## 2014-09-11 LAB — AMMONIA: Ammonia: 164 umol/L — ABNORMAL HIGH (ref 9–35)

## 2014-09-11 LAB — POC OCCULT BLOOD, ED: FECAL OCCULT BLD: POSITIVE — AB

## 2014-09-11 LAB — GLUCOSE, CAPILLARY: GLUCOSE-CAPILLARY: 128 mg/dL — AB (ref 65–99)

## 2014-09-11 LAB — PREPARE RBC (CROSSMATCH)

## 2014-09-11 LAB — PROTIME-INR
INR: 2.64 — ABNORMAL HIGH (ref 0.00–1.49)
Prothrombin Time: 27.8 seconds — ABNORMAL HIGH (ref 11.6–15.2)

## 2014-09-11 LAB — HEMOGLOBIN AND HEMATOCRIT, BLOOD
HEMATOCRIT: 21.8 % — AB (ref 36.0–46.0)
HEMOGLOBIN: 7.1 g/dL — AB (ref 12.0–15.0)

## 2014-09-11 LAB — LIPASE, BLOOD: Lipase: 42 U/L (ref 22–51)

## 2014-09-11 LAB — I-STAT CG4 LACTIC ACID, ED: Lactic Acid, Venous: 3.24 mmol/L (ref 0.5–2.0)

## 2014-09-11 SURGERY — EGD (ESOPHAGOGASTRODUODENOSCOPY)
Anesthesia: Moderate Sedation

## 2014-09-11 MED ORDER — SODIUM CHLORIDE 0.9 % IV SOLN
5.0000 mg/kg | INTRAVENOUS | Status: DC
  Administered 2014-09-11: 225 mg via INTRAVENOUS
  Filled 2014-09-11 (×2): qty 225

## 2014-09-11 MED ORDER — ONDANSETRON HCL 4 MG PO TABS: 4.0000 mg | ORAL_TABLET | Freq: Four times a day (QID) | ORAL | Status: DC | PRN

## 2014-09-11 MED ORDER — SODIUM CHLORIDE 0.9 % IV SOLN: INTRAVENOUS | Status: DC

## 2014-09-11 MED ORDER — PIPERACILLIN-TAZOBACTAM 3.375 G IVPB
3.3750 g | Freq: Three times a day (TID) | INTRAVENOUS | Status: DC
  Administered 2014-09-12 – 2014-09-13 (×5): 3.375 g via INTRAVENOUS
  Filled 2014-09-11 (×9): qty 50

## 2014-09-11 MED ORDER — FENTANYL CITRATE (PF) 100 MCG/2ML IJ SOLN
INTRAMUSCULAR | Status: AC
  Filled 2014-09-11: qty 4

## 2014-09-11 MED ORDER — MIDAZOLAM HCL 5 MG/ML IJ SOLN
INTRAMUSCULAR | Status: AC
  Filled 2014-09-11: qty 2

## 2014-09-11 MED ORDER — ONDANSETRON 4 MG PO TBDP
4.0000 mg | ORAL_TABLET | Freq: Once | ORAL | Status: AC | PRN
  Administered 2014-09-11: 4 mg via ORAL

## 2014-09-11 MED ORDER — SODIUM CHLORIDE 0.9 % IV SOLN
1000.0000 mL | Freq: Once | INTRAVENOUS | Status: AC
  Administered 2014-09-11: 1000 mL via INTRAVENOUS

## 2014-09-11 MED ORDER — PANTOPRAZOLE SODIUM 40 MG IV SOLR
8.0000 mg/h | INTRAVENOUS | Status: DC
  Administered 2014-09-11 – 2014-09-13 (×6): 8 mg/h via INTRAVENOUS
  Filled 2014-09-11 (×12): qty 80

## 2014-09-11 MED ORDER — SODIUM CHLORIDE 0.9 % IV SOLN: Freq: Once | INTRAVENOUS | Status: DC

## 2014-09-11 MED ORDER — ONDANSETRON HCL 4 MG/2ML IJ SOLN
4.0000 mg | Freq: Four times a day (QID) | INTRAMUSCULAR | Status: DC | PRN
Start: 2014-09-11 — End: 2014-09-13

## 2014-09-11 MED ORDER — FENTANYL CITRATE (PF) 100 MCG/2ML IJ SOLN: 100.0000 ug | INTRAMUSCULAR | Status: DC | PRN

## 2014-09-11 MED ORDER — OCTREOTIDE LOAD VIA INFUSION
100.0000 ug | Freq: Once | INTRAVENOUS | Status: DC
  Filled 2014-09-11: qty 50

## 2014-09-11 MED ORDER — SODIUM CHLORIDE 0.9 % IV SOLN
50.0000 ug/h | INTRAVENOUS | Status: DC
  Administered 2014-09-11: 50 ug/h via INTRAVENOUS
  Administered 2014-09-12: 150 ug/h via INTRAVENOUS
  Filled 2014-09-11 (×2): qty 50

## 2014-09-11 MED ORDER — FENTANYL CITRATE (PF) 100 MCG/2ML IJ SOLN
INTRAMUSCULAR | Status: DC | PRN
  Administered 2014-09-11: 25 ug via INTRAVENOUS

## 2014-09-11 MED ORDER — SODIUM CHLORIDE 0.9 % IV SOLN
5.0000 mg/kg | Freq: Two times a day (BID) | INTRAVENOUS | Status: DC
  Administered 2014-09-11 – 2014-09-13 (×3): 225 mg via INTRAVENOUS
  Filled 2014-09-11 (×10): qty 225

## 2014-09-11 MED ORDER — CHLORHEXIDINE GLUCONATE 0.12% ORAL RINSE (MEDLINE KIT)
15.0000 mL | Freq: Two times a day (BID) | OROMUCOSAL | Status: DC
  Administered 2014-09-11 – 2014-09-12 (×2): 15 mL via OROMUCOSAL

## 2014-09-11 MED ORDER — SODIUM CHLORIDE 0.9 % IV SOLN
80.0000 mg | Freq: Once | INTRAVENOUS | Status: AC
  Administered 2014-09-11: 80 mg via INTRAVENOUS
  Filled 2014-09-11: qty 80

## 2014-09-11 MED ORDER — SODIUM CHLORIDE 0.9 % IV SOLN
INTRAVENOUS | Status: DC
  Administered 2014-09-11: 20:00:00 via INTRAVENOUS
  Administered 2014-09-12: 10 mL/h via INTRAVENOUS

## 2014-09-11 MED ORDER — MIDAZOLAM HCL 2 MG/2ML IJ SOLN
INTRAMUSCULAR | Status: AC
  Filled 2014-09-11: qty 4

## 2014-09-11 MED ORDER — ONDANSETRON 4 MG PO TBDP
ORAL_TABLET | ORAL | Status: AC
  Filled 2014-09-11: qty 1

## 2014-09-11 MED ORDER — FENTANYL CITRATE (PF) 100 MCG/2ML IJ SOLN
100.0000 ug | INTRAMUSCULAR | Status: AC | PRN
  Administered 2014-09-11 (×3): 100 ug via INTRAVENOUS
  Filled 2014-09-11 (×3): qty 2

## 2014-09-11 MED ORDER — FENTANYL CITRATE (PF) 100 MCG/2ML IJ SOLN
INTRAMUSCULAR | Status: AC
  Filled 2014-09-11: qty 2

## 2014-09-11 MED ORDER — SODIUM CHLORIDE 0.9 % IV SOLN
1000.0000 mL | INTRAVENOUS | Status: DC
  Administered 2014-09-11: 1000 mL via INTRAVENOUS

## 2014-09-11 MED ORDER — VANCOMYCIN HCL IN DEXTROSE 1-5 GM/200ML-% IV SOLN
1000.0000 mg | Freq: Once | INTRAVENOUS | Status: AC
  Administered 2014-09-11: 1000 mg via INTRAVENOUS
  Filled 2014-09-11: qty 200

## 2014-09-11 MED ORDER — LIDOCAINE HCL (CARDIAC) 20 MG/ML IV SOLN
INTRAVENOUS | Status: AC
  Filled 2014-09-11: qty 5

## 2014-09-11 MED ORDER — POTASSIUM CHLORIDE 20 MEQ/15ML (10%) PO SOLN: 40.0000 meq | Freq: Once | ORAL | Status: DC

## 2014-09-11 MED ORDER — SODIUM CHLORIDE 0.9 % IV SOLN
50.0000 ug/h | INTRAVENOUS | Status: DC
  Administered 2014-09-11 – 2014-09-13 (×6): 50 ug/h via INTRAVENOUS
  Filled 2014-09-11 (×12): qty 1

## 2014-09-11 MED ORDER — PANTOPRAZOLE SODIUM 40 MG IV SOLR: 40.0000 mg | Freq: Two times a day (BID) | INTRAVENOUS | Status: DC

## 2014-09-11 MED ORDER — SODIUM CHLORIDE 0.9 % IJ SOLN
3.0000 mL | Freq: Two times a day (BID) | INTRAMUSCULAR | Status: DC
  Administered 2014-09-13: 3 mL via INTRAVENOUS

## 2014-09-11 MED ORDER — PIPERACILLIN-TAZOBACTAM 3.375 G IVPB 30 MIN
3.3750 g | Freq: Once | INTRAVENOUS | Status: AC
  Administered 2014-09-11: 3.375 g via INTRAVENOUS
  Filled 2014-09-11: qty 50

## 2014-09-11 MED ORDER — ALBUTEROL SULFATE (2.5 MG/3ML) 0.083% IN NEBU: 2.5000 mg | INHALATION_SOLUTION | RESPIRATORY_TRACT | Status: DC | PRN

## 2014-09-11 MED ORDER — POTASSIUM CHLORIDE 10 MEQ/100ML IV SOLN
10.0000 meq | Freq: Once | INTRAVENOUS | Status: AC
  Administered 2014-09-11: 10 meq via INTRAVENOUS
  Filled 2014-09-11: qty 100

## 2014-09-11 MED ORDER — POTASSIUM CHLORIDE 10 MEQ/100ML IV SOLN
10.0000 meq | INTRAVENOUS | Status: AC
  Administered 2014-09-11 (×3): 10 meq via INTRAVENOUS
  Filled 2014-09-11 (×4): qty 100

## 2014-09-11 MED ORDER — HYDROCORTISONE NA SUCCINATE PF 100 MG IJ SOLR: 50.0000 mg | Freq: Three times a day (TID) | INTRAMUSCULAR | Status: DC

## 2014-09-11 MED ORDER — MIDAZOLAM HCL 10 MG/2ML IJ SOLN
INTRAMUSCULAR | Status: DC | PRN
Start: 2014-09-11 — End: 2014-09-11
  Administered 2014-09-11 (×2): 1 mg via INTRAVENOUS
  Administered 2014-09-11: 2 mg via INTRAVENOUS

## 2014-09-11 MED ORDER — DEXTROSE 5 % IV SOLN
5.0000 mg | Freq: Once | INTRAVENOUS | Status: AC
  Administered 2014-09-11: 5 mg via INTRAVENOUS
  Filled 2014-09-11: qty 0.5

## 2014-09-11 MED ORDER — SUCCINYLCHOLINE CHLORIDE 20 MG/ML IJ SOLN
INTRAMUSCULAR | Status: AC
  Filled 2014-09-11: qty 1

## 2014-09-11 MED ORDER — ANTISEPTIC ORAL RINSE SOLUTION (CORINZ)
7.0000 mL | Freq: Four times a day (QID) | OROMUCOSAL | Status: DC
  Administered 2014-09-12 (×2): 7 mL via OROMUCOSAL

## 2014-09-11 MED ORDER — ETOMIDATE 2 MG/ML IV SOLN
INTRAVENOUS | Status: AC
  Filled 2014-09-11: qty 20

## 2014-09-11 MED ORDER — VANCOMYCIN HCL 500 MG IV SOLR
500.0000 mg | Freq: Two times a day (BID) | INTRAVENOUS | Status: DC
  Administered 2014-09-11 – 2014-09-13 (×4): 500 mg via INTRAVENOUS
  Filled 2014-09-11 (×6): qty 500

## 2014-09-11 MED ORDER — POTASSIUM CHLORIDE IN NACL 40-0.9 MEQ/L-% IV SOLN
INTRAVENOUS | Status: DC
  Administered 2014-09-11 – 2014-09-13 (×4): 75 mL/h via INTRAVENOUS
  Filled 2014-09-11 (×5): qty 1000

## 2014-09-11 MED ORDER — INSULIN ASPART 100 UNIT/ML ~~LOC~~ SOLN
1.0000 [IU] | SUBCUTANEOUS | Status: DC
  Administered 2014-09-11: 1 [IU] via SUBCUTANEOUS
  Administered 2014-09-12: 2 [IU] via SUBCUTANEOUS
  Administered 2014-09-12: 1 [IU] via SUBCUTANEOUS

## 2014-09-11 MED ORDER — HYDROCORTISONE NA SUCCINATE PF 100 MG IJ SOLR
50.0000 mg | Freq: Four times a day (QID) | INTRAMUSCULAR | Status: DC
  Administered 2014-09-11 – 2014-09-13 (×9): 50 mg via INTRAVENOUS
  Filled 2014-09-11: qty 1
  Filled 2014-09-11: qty 2
  Filled 2014-09-11: qty 1
  Filled 2014-09-11: qty 2
  Filled 2014-09-11: qty 1
  Filled 2014-09-11: qty 2
  Filled 2014-09-11 (×5): qty 1
  Filled 2014-09-11: qty 2

## 2014-09-11 MED ORDER — SODIUM CHLORIDE 0.9 % IJ SOLN
INTRAMUSCULAR | Status: DC | PRN
  Administered 2014-09-11: 12:00:00

## 2014-09-11 MED ORDER — DIPHENHYDRAMINE HCL 50 MG/ML IJ SOLN
INTRAMUSCULAR | Status: AC
  Filled 2014-09-11: qty 1

## 2014-09-11 MED ORDER — ONDANSETRON HCL 4 MG/2ML IJ SOLN
4.0000 mg | Freq: Once | INTRAMUSCULAR | Status: AC
  Administered 2014-09-11: 4 mg via INTRAVENOUS
  Filled 2014-09-11: qty 2

## 2014-09-11 MED ORDER — MIDAZOLAM HCL 2 MG/2ML IJ SOLN
INTRAMUSCULAR | Status: AC
  Filled 2014-09-11: qty 2

## 2014-09-11 MED ORDER — ROCURONIUM BROMIDE 50 MG/5ML IV SOLN
INTRAVENOUS | Status: AC
  Filled 2014-09-11: qty 2

## 2014-09-11 NOTE — Interval H&P Note (Signed)
History and Physical Interval Note:  09/11/2014 11:04 AM  Melanie Conley  has presented today for surgery, with the diagnosis of GI Bleed  The various methods of treatment have been discussed with the patient and family. After consideration of risks, benefits and other options for treatment, the patient has consented to  Procedure(s): ESOPHAGOGASTRODUODENOSCOPY (EGD) (N/A) FLEXIBLE SIGMOIDOSCOPY (N/A) as a surgical intervention .  The patient's history has been reviewed, patient examined, no change in status, stable for surgery.  I have reviewed the patient's chart and labs.  Questions were answered to the patient's satisfaction.     [name]

## 2014-09-11 NOTE — ED Notes (Signed)
GI at bedside

## 2014-09-11 NOTE — ED Notes (Signed)
Pt arrives from home c/o weakness, tremor, new onset today.  Pt endorses lightheadedness, dizziness.  Pt denies falls. Pt reports n/v x 3 days. AO x 2.

## 2014-09-11 NOTE — Procedures (Signed)
Intubation Procedure Note CIRA DEYOE [id number] 17-Oct-1990  Procedure: Intubation Indications: Airway protection and maintenance  Procedure Details Consent: Unable to obtain consent because of emergent medical necessity. Time Out: Verified patient identification, verified procedure, site/side was marked, verified correct patient position, special equipment/implants available, medications/allergies/relevent history reviewed, required imaging and test results available.  Performed  Maximum sterile technique was used including gloves, gown, hand hygiene and mask.  MAC    Evaluation Hemodynamic Status: BP stable throughout; O2 sats: stable throughout Patient's Current Condition: stable Complications: No apparent complications Patient did tolerate procedure well. Chest X-ray ordered to verify placement.  CXR: pending.   Koren Bound 09/11/2014

## 2014-09-11 NOTE — ED Notes (Signed)
Dr. Preston Fleeting made aware of pts current vital signs.

## 2014-09-11 NOTE — Progress Notes (Addendum)
ANTIBIOTIC CONSULT NOTE - INITIAL  Pharmacy Consult for Vancomycin, Zosyn, Ganciclovir Indication: Sepsis, hx CMV colitis  No Known Allergies  Patient Measurements: Height:  (149.9 cm) Weight: 99 lb 3 oz (44.991 kg) IBW/kg (Calculated) : 43.2  Vital Signs: Temp: 95.9 F (35.5 C) (08/14 0432) Temp Source: Rectal (08/14 0432) BP: 135/91 mmHg (08/14 0109) Pulse Rate: 112 (08/14 0109) Intake/Output from previous day:   Intake/Output from this shift:    Labs:  Recent Labs  09/11/14 0142  WBC 12.6*  HGB 10.4*  PLT 137*  CREATININE 0.61   Estimated Creatinine Clearance: 74.6 mL/min (by C-G formula based on Cr of 0.61). No results for input(s): VANCOTROUGH, VANCOPEAK, VANCORANDOM, GENTTROUGH, GENTPEAK, GENTRANDOM, TOBRATROUGH, TOBRAPEAK, TOBRARND, AMIKACINPEAK, AMIKACINTROU, AMIKACIN in the last 72 hours.   Microbiology: Recent Results (from the past 720 hour(s))  Culture, body fluid-bottle     Status: None   Collection Time: 09/05/14  9:50 AM  Result Value Ref Range Status   Specimen Description FLUID PERITONEAL  Final   Special Requests NONE  Final   Culture NO GROWTH 5 DAYS  Final   Report Status 09/10/2014 FINAL  Final  Gram stain     Status: None   Collection Time: 09/05/14  9:50 AM  Result Value Ref Range Status   Specimen Description FLUID PERITONEAL  Final   Special Requests NONE  Final   Gram Stain   Final    FEW WBC PRESENT,BOTH PMN AND MONONUCLEAR NO ORGANISMS SEEN    Report Status 09/05/2014 FINAL  Final  MRSA PCR Screening     Status: None   Collection Time: 09/05/14  4:00 PM  Result Value Ref Range Status   MRSA by PCR NEGATIVE NEGATIVE Final    Comment:        The GeneXpert MRSA Assay (FDA approved for NASAL specimens only), is one component of a comprehensive MRSA colonization surveillance program. It is not intended to diagnose MRSA infection nor to guide or monitor treatment for MRSA infections.   C difficile quick scan w PCR  reflex     Status: Abnormal   Collection Time: 09/05/14  8:15 PM  Result Value Ref Range Status   C Diff antigen POSITIVE (A) NEGATIVE Final   C Diff toxin POSITIVE (A) NEGATIVE Final   C Diff interpretation Positive for toxigenic C. difficile  Final    Medical History: Past Medical History  Diagnosis Date  . Liver disease   . Hepatitis   . Anemia   . Esophageal varices   . Autoimmune hepatitis   . GIB (gastrointestinal bleeding)   . Cirrhosis   . Primary sclerosing cholangitis   . Irregular periods 08/04/2014  . Vaginal Pap smear, abnormal     Medications:   (Not in a hospital admission) Assessment: Melanie Conley who presented with weakness, abdominal pain and altered mental status. Pharmacy consulted to start IV Vancomycin and Zosyn for Sepsis. WBC 12.6. CrCl ~ 74 mL/min   8/14 BCx2>> 8/14 UCx>>   Goal of Therapy:  Vancomycin trough level 15-20 mcg/ml  Plan:  -Start Vancomycin 500 mg IV Q 12 hours -Zosyn 3.375 gm IV Q 8 hours  -Monitor CBC, renal fx, cultures and clinical progress -[facility] at [facility]   Vinnie Level, PharmD., BCPS Clinical Pharmacist Pager [phone]  Addendum: Pharmacy consulted to start ganciclovir for hx CMV colitis. Renal function wnl.  Plan: - Ganciclovir /kg IV q12  Louie Casa, PharmD, BCPS 09/11/2014, 9:48 AM

## 2014-09-11 NOTE — ED Notes (Signed)
Pt asking for bed pan to have a bowel movement. Pt placed on bed pan and given privacy. Upon returning to the room pt had passed a large bloody bowel movement into the bed pan. Blood was smeared all over the patient's legs and the bed. Pt denying abd pain. Vital signs stable, BP of 104/69. MD made aware and will recheck hemoglobin.

## 2014-09-11 NOTE — Op Note (Signed)
[name] [address] [place], [address]   ENDOSCOPY PROCEDURE REPORT  PATIENT: Melanie Conley, Melanie Conley  MR#: [id number] BIRTHDATE: 1990/11/28 , 23  yrs. old GENDER: female ENDOSCOPIST: Charlott Rakes, MD REFERRED BY: PROCEDURE DATE:  09-29-14 PROCEDURE:  EGD w/ band ligation of varices ASA CLASS:     Class IV INDICATIONS:  hematochezia and acute post hemorrhagic anemia. MEDICATIONS: Fentanyl 25 mcg IV, Versed 4 mg IV ; XBJ:YNWGNF 8 cc injected submucosally TOPICAL ANESTHETIC: none  DESCRIPTION OF PROCEDURE: After the risks benefits and alternatives of the procedure were thoroughly explained, informed consent was obtained.  The [employer] GASTOROSCOPE [id number] endoscope was introduced through the mouth and advanced to the second portion of the duodenum , Without limitations.  The instrument was slowly withdrawn as the mucosa was fully examined. Estimated blood loss is zero unless otherwise noted in this procedure report.    Medium-sized distal esophageal varices noted without bleeding stigmata. GEJ 32 cm from the incisors. Large amount of red blood and clots in the dependent portion of the stomach. An area of bright red blood seen on the lesser curvature and after irrigation and arterial spurting spot seen without any surrounding ulcer seen. Distal stomach edematous and no fresh blood seen in this area. Due to continued arterial bleeding in the proximal stomach the endoscope was not advanced into the duodenal bulb. Retroflexed views revealed showed a large amount of blood preventing visualization of the majority of the fundus and part of the cardia.  The arterial spurting area initially was thought to represent a Dieulafoy lesion vs gastric varix. 8 cc of epinephrine:saline (1:10,000 U) injected around the bleeding area without benefit. The endoscope was withdrawn and a variceal bander was attached to the tip in the usual fashion. The endoscope  was reinserted and a band was deployed on the bleeding gastric varix. This band was attached successfully but slipped off after a few minutes and another was deployed without success. A third band was deployed and successfully remained in position. The endoscope was withdrawn and the patient was intubated for airway protection and then the endoscope was reinserted and the band was noted to have remained in place with hemostasis prior to withdrawal of the endoscope.     The scope was then withdrawn from the patient and the procedure completed.  COMPLICATIONS: There were no immediate complications.  ENDOSCOPIC IMPRESSION:     Bleeding gastric varix - s/p band ligation High risk for rebleeding from dislodgement of band with additional gastric varix bleeding Esophageal varices (no bleeding stigmata)  RECOMMENDATIONS:     If rebleeding then needs BRTO vs TIPS vs surgery (IR and surgery consulted); NPO; Volume resuscitate; Correct coagulopathy; IV ABx   eSigned:  Charlott Rakes, MD 29-Sep-2014 1:20 PM    CC:  CPT CODES: ICD CODES:  The ICD and CPT codes recommended by this software are interpretations from the data that the clinical staff has captured with the software.  The verification of the translation of this report to the ICD and CPT codes and modifiers is the sole responsibility of the health care institution and practicing physician where this report was generated.  [employer] will not be held responsible for the validity of the ICD and CPT codes included on this report.  AMA assumes no liability for data contained or not contained herein. CPT is a Publishing rights manager of the [employer].  PATIENT NAME:  Melanie Conley, Melanie Conley MR#: [id number]

## 2014-09-11 NOTE — H&P (View-Only) (Signed)
Referring Provider: Dr. Preston Fleeting Primary Care Physician:  Woodfin Ganja, MD Primary Gastroenterologist:  Gentry Fitz  Reason for Consultation:  GI bleed; Encephalopathy; Cirrhosis  HPI: Melanie Conley is a 24 y.o. female is a autoimmune hepatitis/PSC with decompensated cirrhosis who was in the hospital last week for abdominal pain and ascites and found to have diffuse colitis by CT imaging. No SBP during paracentesis last week. She is on treatment for CMV viremia. She reportedly has been placed back on the liver transplant list. She comes in today confused with signs of hepatic encephalopathy and she is unable to give me any consistent history about her confusion and does not recall bleeding. Complains of abdominal pain but unable to give me any history about the pain. She had a large amount of dark red blood from her rectum in the emergency room. No history of vomiting. She reports abdominal pain but cannot compare it to her last admission. Hypotension with SBP in the 90's and hypothermic in ER. Ammonia 164. TB 7.5 (3.8 on 09/07/14). ALP 448, AST 114, ALT 60. Hgb 10.4 on admit and now 7.1 (8.9 on 09/06/12). WBC 12.6. INR 2.64. No family in room during my evaluation but I spoke with her mother by phone. She thinks it is "2006" and is slow to tell her date of birth. Oriented to place. She has an appt at [facility] this week.    Past Medical History  Diagnosis Date  . Liver disease   . Hepatitis   . Anemia   . Esophageal varices   . Autoimmune hepatitis   . GIB (gastrointestinal bleeding)   . Cirrhosis   . Primary sclerosing cholangitis   . Irregular periods 08/04/2014  . Vaginal Pap smear, abnormal     Past Surgical History  Procedure Laterality Date  . Gastric banding port revision    . Tonsillectomy    . Esophagogastroduodenoscopy  07/22/2011    Procedure: ESOPHAGOGASTRODUODENOSCOPY (EGD);  Surgeon: Graylin Shiver, MD;  Location: [facility] ENDOSCOPY;  Service: Endoscopy;  Laterality: N/A;    Prior to  Admission medications   Medication Sig Start Date End Date Taking? Authorizing Provider  azaTHIOprine (IMURAN) 50 MG tablet Take 50 mg by mouth daily. 06/21/14  Yes Historical Provider, MD  carvedilol (COREG) 3.125 MG tablet Take 3.125 mg by mouth 2 (two) times daily. 06/17/14 06/17/15 Yes Historical Provider, MD  ciprofloxacin (CIPRO) 500 MG tablet Take 1 tablet (500 mg total) by mouth 2 (two) times daily. 09/06/14  Yes Hollice Espy, MD  feeding supplement, ENSURE ENLIVE, (ENSURE ENLIVE) LIQD Take 237 mLs by mouth 2 (two) times daily between meals. 09/07/14  Yes Hollice Espy, MD  furosemide (LASIX) 40 MG tablet Take 1 tablet (40 mg total) by mouth daily. 06/05/14  Yes Shanker Levora Dredge, MD  lactose free nutrition (BOOST) LIQD Take 237 mLs by mouth daily.   Yes Historical Provider, MD  lactulose (CHRONULAC) 10 GM/15ML solution Take 30 mLs (20 g total) by mouth 3 (three) times daily. 09/07/14  Yes Hollice Espy, MD  lidocaine (XYLOCAINE) 2 % solution Use as directed 200 mg in the mouth or throat 3 (three) times daily as needed. For GI / esophageal upset 07/14/14  Yes Historical Provider, MD  metoCLOPramide (REGLAN) 5 MG tablet Take 5 mg by mouth 2 (two) times daily. 30 minutes before a meal 07/26/14  Yes Historical Provider, MD  metroNIDAZOLE (FLAGYL) 500 MG tablet Take 1 tablet (500 mg total) by mouth 3 (three) times daily. 09/06/14  Yes  Hollice Espy, MD  omeprazole (PRILOSEC) 40 MG capsule Take 40 mg by mouth daily.   Yes Historical Provider, MD  potassium chloride (K-DUR) 10 MEQ tablet Take 1 tablet (10 mEq total) by mouth once. 08/08/14  Yes Azalia Bilis, MD  predniSONE (DELTASONE) 5 MG tablet Take 5 mg by mouth daily.   Yes Historical Provider, MD  Protein POWD Take 1 scoop by mouth 2 (two) times daily.   Yes Historical Provider, MD  spironolactone (ALDACTONE) 50 MG tablet Take 100 mg by mouth daily.    Yes Historical Provider, MD  ursodiol (ACTIGALL) 300 MG capsule Take 300 mg by mouth 2  (two) times daily.    Yes Historical Provider, MD  valGANciclovir (VALCYTE) 450 MG tablet Take 900 mg by mouth daily. 07/08/14 10/06/14 Yes Historical Provider, MD  albuterol (PROVENTIL) (2.5 MG/3ML) 0.083% nebulizer solution Take 2.5 mg by nebulization every 4 (four) hours as needed for wheezing or shortness of breath.  07/14/14   Historical Provider, MD    Scheduled Meds: . hydrocortisone sod succinate (SOLU-CORTEF) inj  50 mg Intravenous Q8H  . octreotide  100 mcg Intravenous Once  . ondansetron      . pantoprazole (PROTONIX) IV  40 mg Intravenous Q12H   Continuous Infusions: . sodium chloride 1,000 mL (09/11/14 0558)  . octreotide  (SANDOSTATIN)    IV infusion 50 mcg/hr (09/11/14 0845)  . pantoprazole (PROTONIX) IV    . pantoprozole (PROTONIX) infusion    . phytonadione (VITAMIN K) IV 5 mg (09/11/14 0840)  . piperacillin-tazobactam (ZOSYN)  IV    . vancomycin     PRN Meds:.  Allergies as of 09/11/2014  . (No Known Allergies)    Family History  Problem Relation Age of Onset  . Adopted: Yes    Social History   Social History  . Marital Status: Single    Spouse Name: N/A  . Number of Children: N/A  . Years of Education: N/A   Occupational History  . Not on file.   Social History Main Topics  . Smoking status: Current Every Day Smoker -- 0.50 packs/day for 0 years    Types: Cigarettes  . Smokeless tobacco: Never Used  . Alcohol Use: No  . Drug Use: No  . Sexual Activity: Yes    Birth Control/ Protection: None   Other Topics Concern  . 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       History of Present Illness: Patient is a pleasant 24 year old [place] American female who was diagnosed with liver disease at age 35 when she presented with a GI bleed. She has undergone liver biopsy and ERCP in 2003. 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 Her complications of cirrhosis include bleeding esophageal varicies, ascites, SBP and encephalopathy. She was listed for OLT at [facility] in 2006 but made status 7 due to a low MELD score and a good quality of life. We have been unable to reactivate her as an adult because she has not passed the psychosocial evaluation. She has undergone formal neuropsych testing. In the past, she has stated that she does not wish to undergo OLT even if she needs it. In regards to her AIH, her serologies are negative and she has been on Imuran and prednisone since her diagnosis in 2003.  Regarding her PSC, she has had one bout of cholangitis years ago per report. She denies a history of stenting or dominant biliary stricture or choledolcholithiasis. She was previously on high dose ursodiol.       She presents today with her Mom. I last saw her in Feb 2015. She has been hospitalized here at [facility] for abdominal pain/worsening ascites in 11/2013 and the again at [facility] in 12/2013 for SBP despite being on Levoquin prophylaxis. Her outside records from 12/2013 were reviewed in Care Everywhere. Ascites fluid showed 7866 nuc cells with 83% polys. Blood Ctx were negative. She had 3 days of IV Rocephin with IV albumin on D1 and D3 as well as 7 days of Augmentin. She also had a 2.7L paracentesis. Stool studies were neg (C diff and immunocompromised host panel). She also had a CT scan which was unremarkable for acute event- patent PV. She denies any worsening jaundice, pruritis, SOB, CP, increased abdominal girth, LE edema, melena, BRBPR, confusion, depression, nausea, vomiting, or diarrhea. Her abdominal pain has resolved and her ascites is well controlled on aldactone 100mg  and lasix 40mg  daily. She is following a low Na diet.      Cirrhosis Care:    1.HCC screen: MRI 09/06/13 and CT scan 12/13/13 cirrhosis, ascites, marked splenomegaly, no masses, biliary stricturing   2.Varicies surveillance: EGD 07/17/13 Grade II  esophageal varices with EVL X 2. Missed follow-up.   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    -- Liver biopsy 08/03/01 minute fragments bile duct proliferation and periductular fibrosis; hepatitis G1-2, S2; ERCP 09/23/01 Subtle changes/narrowing but no dominant strictures; MRCP images 10/08 showed focal narrowing in right hepatic system; serologies ANA neg ASMA neg, antiLKMI neg AMA neg IgG normal (low level + ASMA 1:20 in 2003)    -- Occult GI bleeding with many EGD's, VCE 11/09 normal, Colonoscopy 7/06 and ?2/10 with biopsies negative for IBD including TI.    -- seasonal allergies    -- s/p tonsilectomy    -- Immune to HAV and HBV (2007)    -- EBV IgG pos CMV IgG and IgM neg       Social History:    Lives near [place], [place] with parents sister and brother. Melanie Conley is the middle child (she is adopted).   Her Mom teaches Ambulance person.    No tobacco or alcohol.    Brigida had been at [employer] at [employer] studying early childhood development but is currently working in Warden/ranger at [employer].   Pets: 1 dog and 1 cat        Review of Systems: Unable to obtain from patient (due to encephalopathy)  Physical Exam: Vital signs: Filed Vitals:   09/11/14 0850  BP: 113/63  Pulse: 134  Temp: 99.3  Resp: 29     General:   Lethargic, thin, oriented to place and person not time Head: atraumatic Eyes: +scleral icterus ENT: oropharynx clear Neck: supple, nontender Lungs:  Clear throughout to auscultation.   No wheezes, crackles, or rhonchi. No acute distress. Heart:  Regular rate and rhythm; no murmurs, clicks, rubs,  or gallops. Abdomen: diffuse tenderness with guarding, mild distention, +BS  Rectal:   Deferred Ext: pulses intact, no edema Neuro: +asterixis,  oriented to place and person not time Skin: no rash  GI:  Lab Results:  Recent Labs  09/11/14 0142  WBC 12.6*  HGB 10.4*  HCT 31.6*  PLT 137*   BMET  Recent Labs  09/11/14 0142  NA 132*  K 2.8*  CL 96*  CO2 27  GLUCOSE 115*  BUN 10  CREATININE 0.61  CALCIUM 7.8*   LFT  Recent Labs  09/11/14 0142  PROT 5.7*  ALBUMIN 2.6*  AST 114*  ALT 60*  ALKPHOS 448*  BILITOT 7.5*   PT/INR  Recent Labs  09/11/14 0632  LABPROT 27.8*  INR 2.64*     Studies/Results: Ct Head Wo Contrast  09/11/2014   CLINICAL DATA:  Lethargy and confusion.  EXAM: CT HEAD WITHOUT CONTRAST  TECHNIQUE: Contiguous axial images were obtained from the base of the skull through the vertex without intravenous contrast.  COMPARISON:  None.  FINDINGS: The brain demonstrates no evidence of hemorrhage, infarction, edema, mass effect, extra-axial fluid collection, hydrocephalus or mass lesion. The skull is unremarkable.  IMPRESSION: Normal head CT.   Electronically Signed   By: Irish Lack M.D.   On: 09/11/2014 08:15   Dg Chest Port 1 View  09/11/2014   CLINICAL DATA:  Shortness of breath.  EXAM: PORTABLE CHEST - 1 VIEW  COMPARISON:  August 11, 2014.  FINDINGS: The heart size and mediastinal contours are within normal limits. Both lungs are clear. No pneumothorax or pleural effusion is noted. The visualized skeletal structures are unremarkable.  IMPRESSION: No acute cardiopulmonary abnormality seen.   Electronically Signed   By: Lupita Raider, M.D.   On: 09/11/2014 07:20    Impression/Plan: 24 yo with decompensated cirrhosis who is have rectal bleeding likely related to her colitis but she needs an EGD to evaluate for a variceal source due to her severe decompensated state. MELD score now 25 (up from 20 last week). Octreotide drip; IV PPI Q 12 hours; Start IV antibiotics for GI bleed in the setting of cirrhosis. NPO. EGD bedside this morning.  Agree with Vit K and FFP to correct her coagulopathy. Lactulose and Rifaximin for her encephalopathy. Transfer to [facility] Hepatology when stabilized for ongoing management of her decompensated cirrhosis.    LOS: 0 days   Blayton Huttner C.  09/11/2014, 9:06 AM  Pager [phone]  If no answer or after 5 PM call [phone]

## 2014-09-11 NOTE — ED Provider Notes (Signed)
CSN: [id number]     Arrival date & time 09/11/14  0034 History   First MD Initiated Contact with Patient 09/11/14 [phone]     Chief Complaint  Patient presents with  . Weakness  . Nausea  . Abdominal Pain     (Consider location/radiation/quality/duration/timing/severity/associated sxs/prior Treatment) Patient is a 24 y.o. female presenting with weakness and abdominal pain. The history is provided by medical records. The history is limited by the condition of the patient (Altered mental status).  Weakness Associated symptoms include abdominal pain.  Abdominal Pain She has a known history of autoimmune hepatitis and primary sclerosing cholangitis and presented to the emergency department complaining of weakness, diarrhea, tremor. She is extremely confused and is not able to give me any history and other no family members with her.  Past Medical History  Diagnosis Date  . Liver disease   . Hepatitis   . Anemia   . Esophageal varices   . Autoimmune hepatitis   . GIB (gastrointestinal bleeding)   . Cirrhosis   . Primary sclerosing cholangitis   . Irregular periods 08/04/2014  . Vaginal Pap smear, abnormal    Past Surgical History  Procedure Laterality Date  . Gastric banding port revision    . Tonsillectomy    . Esophagogastroduodenoscopy  07/22/2011    Procedure: ESOPHAGOGASTRODUODENOSCOPY (EGD);  Surgeon: Graylin Shiver, MD;  Location: [facility] ENDOSCOPY;  Service: Endoscopy;  Laterality: N/A;   Family History  Problem Relation Age of Onset  . Adopted: Yes   Social History  Substance Use Topics  . Smoking status: Current Every Day Smoker -- 0.50 packs/day for 0 years    Types: Cigarettes  . Smokeless tobacco: Never Used  . Alcohol Use: No   OB History    Gravida Para Term Preterm AB TAB SAB Ectopic Multiple Living       Review of Systems  Unable to perform ROS: Mental status change  Gastrointestinal: Positive for abdominal pain.  Neurological: Positive for  weakness.      Allergies  Review of patient's allergies indicates no known allergies.  Home Medications   Prior to Admission medications   Medication Sig Start Date End Date Taking? Authorizing Provider  albuterol (PROVENTIL) (2.5 MG/3ML) 0.083% nebulizer solution Take 2.5 mg by nebulization every 4 (four) hours as needed for wheezing or shortness of breath.  07/14/14   Historical Provider, MD  azaTHIOprine (IMURAN) 50 MG tablet Take 50 mg by mouth daily. 06/21/14   Historical Provider, MD  carvedilol (COREG) 3.125 MG tablet Take 3.125 mg by mouth 2 (two) times daily. 06/17/14 06/17/15  Historical Provider, MD  ciprofloxacin (CIPRO) 500 MG tablet Take 1 tablet (500 mg total) by mouth 2 (two) times daily. 09/06/14   Hollice Espy, MD  feeding supplement, ENSURE ENLIVE, (ENSURE ENLIVE) LIQD Take 237 mLs by mouth 2 (two) times daily between meals. 09/07/14   Hollice Espy, MD  furosemide (LASIX) 40 MG tablet Take 1 tablet (40 mg total) by mouth daily. 06/05/14   Shanker Levora Dredge, MD  lactose free nutrition (BOOST) LIQD Take 237 mLs by mouth daily.    Historical Provider, MD  lactulose (CHRONULAC) 10 GM/15ML solution Take 30 mLs (20 g total) by mouth 3 (three) times daily. 09/07/14   Hollice Espy, MD  lidocaine (XYLOCAINE) 2 % solution Use as directed 200 mg in the mouth or throat 3 (three) times daily as needed. For GI / esophageal  upset 07/14/14   Historical Provider, MD  metoCLOPramide (REGLAN) 5 MG tablet Take 5 mg by mouth 2 (two) times daily. 30 minutes before a meal 07/26/14   Historical Provider, MD  metroNIDAZOLE (FLAGYL) 500 MG tablet Take 1 tablet (500 mg total) by mouth 3 (three) times daily. 09/06/14   Hollice Espy, MD  omeprazole (PRILOSEC) 40 MG capsule Take 40 mg by mouth daily.    Historical Provider, MD  potassium chloride (K-DUR) 10 MEQ tablet Take 1 tablet (10 mEq total) by mouth once. 08/08/14   Azalia Bilis, MD  predniSONE (DELTASONE) 5 MG tablet Take 5 mg by mouth  daily.    Historical Provider, MD  Protein POWD Take 1 scoop by mouth 2 (two) times daily.    Historical Provider, MD  spironolactone (ALDACTONE) 50 MG tablet Take 100 mg by mouth daily.     Historical Provider, MD  ursodiol (ACTIGALL) 300 MG capsule Take 300 mg by mouth 2 (two) times daily.     Historical Provider, MD  valGANciclovir (VALCYTE) 450 MG tablet Take 900 mg by mouth daily. 07/08/14 10/06/14  Historical Provider, MD   BP 135/91 mmHg  Pulse 112  Temp(Src) 95.9 F (35.5 C) (Rectal)  Resp 18  Ht 4\' 11"  (1.499 m)  Wt 99 lb 3 oz (44.991 kg)  BMI 20.02 kg/m2  SpO2 100% Physical Exam  Nursing note and vitals reviewed.  24 year old female, resting comfortably and in no acute distress. Vital signs are significant for tachycardia and hyperthermia as well as borderline hypertension. Oxygen saturation is 100%, which is normal. Head is normocephalic and atraumatic. PERRLA, EOMI. Oropharynx is clear. Moderate scleral icterus is present. Neck is nontender and supple without adenopathy or JVD. Back is nontender and there is no CVA tenderness. Lungs are clear without rales, wheezes, or rhonchi. Chest is nontender. Heart has regular rate and rhythm without murmur. Abdomen is soft, flat, nontender without masses or hepatosplenomegaly and peristalsis is hypoactive. Extremities have no cyanosis or edema, full range of motion is present. Skin is warm and dry without rash. Neurologic: She is awake and does respond to her environment. She is oriented to person but not place or time, cranial nerves are intact, there are no motor or sensory deficits.  ED Course  Procedures (including critical care time) External Jugular Vein cannulation Performed by: ZOXWR,UEAVW Consent: The procedure was performed in an emergent situation. Required items: required blood products, implants, devices, and special equipment available Patient identity confirmed: arm band and provided demographic data Time out:  Immediately prior to procedure a "time out" was called to verify the correct patient, procedure, equipment, support staff and site/side marked as required. Indications: vascular access Anesthesia: None Local anesthetic:None Patient sedated: no Preparation: skin prepped with 2% chlorhexidine Skin prep agent dried: skin prep agent completely dried prior to procedure Sterile barriers: all five maximum sterile barriers used - cap, mask, sterile gown, sterile gloves, and large sterile sheet Hand hygiene: hand hygiene performed prior to central venous catheter insertion  Location details: Left external jugular vein  Catheter size: 21 guage Pre-procedure: landmarks identified Ultrasound guidance: No Successful placement: yes Post-procedure: line taped in position and dressing applied Patient tolerance: Patient tolerated the procedure well with no immediate complications.  Labs Review Results for orders placed or performed during the hospital encounter of 09/11/14  Lipase, blood  Result Value Ref Range   Lipase 42 22 - 51 U/L  Comprehensive metabolic panel  Result Value Ref Range   Sodium  132 (L) 135 - 145 mmol/L   Potassium 2.8 (L) 3.5 - 5.1 mmol/L   Chloride 96 (L) 101 - 111 mmol/L   CO2 27 22 - 32 mmol/L   Glucose, Bld 115 (H) 65 - 99 mg/dL   BUN 10 6 - 20 mg/dL   Creatinine, Ser 4.09 0.44 - 1.00 mg/dL   Calcium 7.8 (L) 8.9 - 10.3 mg/dL   Total Protein 5.7 (L) 6.5 - 8.1 g/dL   Albumin 2.6 (L) 3.5 - 5.0 g/dL   AST 811 (H) 15 - 41 U/L   ALT 60 (H) 14 - 54 U/L   Alkaline Phosphatase 448 (H) 38 - 126 U/L   Total Bilirubin 7.5 (H) 0.3 - 1.2 mg/dL   GFR calc non Af Amer >60 >60 mL/min   GFR calc Af Amer >60 >60 mL/min   Anion gap 9 5 - 15  CBC  Result Value Ref Range   WBC 12.6 (H) 4.0 - 10.5 K/uL   RBC 3.44 (L) 3.87 - 5.11 MIL/uL   Hemoglobin 10.4 (L) 12.0 - 15.0 g/dL   HCT 91.4 (L) 78.2 - 95.6 %   MCV 91.9 78.0 - 100.0 fL   MCH 30.2 26.0 - 34.0 pg   MCHC 32.9 30.0 - 36.0  g/dL   RDW 21.3 (H) 08.6 - 57.8 %   Platelets 137 (L) 150 - 400 K/uL  Urinalysis, Routine w reflex microscopic (not at [facility])  Result Value Ref Range   Color, Urine AMBER (A) YELLOW   APPearance CLEAR CLEAR   Specific Gravity, Urine 1.021 1.005 - 1.030   pH 6.5 5.0 - 8.0   Glucose, UA NEGATIVE NEGATIVE mg/dL   Hgb urine dipstick NEGATIVE NEGATIVE   Bilirubin Urine SMALL (A) NEGATIVE   Ketones, ur NEGATIVE NEGATIVE mg/dL   Protein, ur NEGATIVE NEGATIVE mg/dL   Urobilinogen, UA 0.2 0.0 - 1.0 mg/dL   Nitrite NEGATIVE NEGATIVE   Leukocytes, UA NEGATIVE NEGATIVE  Differential  Result Value Ref Range   Neutro Abs 8.2 (H) 1.7 - 7.7 K/uL   Lymphs Abs 2.1 0.7 - 4.0 K/uL   Monocytes Absolute 2.3 (H) 0.1 - 1.0 K/uL   Eosinophils Absolute 0.1 0.0 - 0.7 K/uL   Basophils Absolute 0.0 0.0 - 0.1 K/uL   Neutrophils Relative % 64 43 - 77 %   Lymphocytes Relative 17 12 - 46 %   Monocytes Relative 18 (H) 3 - 12 %   Eosinophils Relative 1 0 - 5 %   Basophils Relative 0 0 - 1 %   RBC Morphology TEARDROP CELLS    WBC Morphology ATYPICAL LYMPHOCYTES   Ammonia  Result Value Ref Range   Ammonia 164 (H) 9 - 35 umol/L  Magnesium  Result Value Ref Range   Magnesium 1.8 1.7 - 2.4 mg/dL  Lactic acid, plasma  Result Value Ref Range   Lactic Acid, Venous 3.4 (HH) 0.5 - 2.0 mmol/L  Protime-INR  Result Value Ref Range   Prothrombin Time 27.8 (H) 11.6 - 15.2 seconds   INR 2.64 (H) 0.00 - 1.49  CBG monitoring, ED  Result Value Ref Range   Glucose-Capillary 166 (H) 65 - 99 mg/dL  I-Stat CG4 Lactic Acid, ED  Result Value Ref Range   Lactic Acid, Venous 3.24 (HH) 0.5 - 2.0 mmol/L   Comment NOTIFIED PHYSICIAN   POC occult blood, ED  Result Value Ref Range   Fecal Occult Bld POSITIVE (A) NEGATIVE    Imaging Review Ct Head Wo Contrast  09/11/2014   CLINICAL DATA:  Lethargy and confusion.  EXAM: CT HEAD WITHOUT CONTRAST  TECHNIQUE: Contiguous axial images were obtained from the base of the skull through  the vertex without intravenous contrast.  COMPARISON:  None.  FINDINGS: The brain demonstrates no evidence of hemorrhage, infarction, edema, mass effect, extra-axial fluid collection, hydrocephalus or mass lesion. The skull is unremarkable.  IMPRESSION: Normal head CT.   Electronically Signed   By: Irish Lack M.D.   On: 09/11/2014 08:15   Dg Chest Port 1 View  09/11/2014   CLINICAL DATA:  Shortness of breath.  EXAM: PORTABLE CHEST - 1 VIEW  COMPARISON:  August 11, 2014.  FINDINGS: The heart size and mediastinal contours are within normal limits. Both lungs are clear. No pneumothorax or pleural effusion is noted. The visualized skeletal structures are unremarkable.  IMPRESSION: No acute cardiopulmonary abnormality seen.   Electronically Signed   By: Lupita Raider, M.D.   On: 09/11/2014 07:20   I, Connelly Spruell, personally reviewed and evaluated these images and lab results as part of my medical decision-making.  CRITICAL CARE Performed by: Dione Booze Total critical care time: 200 minutes Critical care time was exclusive of separately billable procedures and treating other patients. Critical care was necessary to treat or prevent imminent or life-threatening deterioration. Critical care was time spent personally by me on the following activities: development of treatment plan with patient and/or surrogate as well as nursing, discussions with consultants, evaluation of patient's response to treatment, examination of patient, obtaining history from patient or surrogate, ordering and performing treatments and interventions, ordering and review of laboratory studies, ordering and review of radiographic studies, pulse oximetry and re-evaluation of patient's condition.  MDM   Final diagnoses:  Hepatic encephalopathy  Rectal bleeding  Coagulopathy  Other cirrhosis of liver  Hypokalemia  Hyponatremia  Normochromic normocytic anemia    Patient with known cirrhosis and altered mental status with  abnormal vital signs including tachycardia and hypothermia. Need to consider possibility of sepsis although this could be hepatic encephalopathy. Old records reviewed in she was recently discharged from the hospital where she did have hepatic encephalopathy and was to take lactulose at home. Screening labs including blood cultures are obtained and she is being given IV fluid for early goal-directed therapy. Laboratory workup does show worsening of transaminases and bilirubin and severe hypokalemia. She is given intravenous potassium.  Patient continues to be tachycardic. Chest x-ray and urine showed no sign of infection. Ammonia level came back significantly elevated at 164, but this is approximately the level that she had been discharged at. Laboratory workup shows node drop in hemoglobin. INR is elevated at 2.64 and lactic acid is mildly elevated at 3.4. All liver enzymes are significantly higher than they were at her recent hospitalization.   She never became hypotensive but she continued to be tachycardic. She then had a large, bloody bowel movement. Blood is being sent for type and crossmatch and she is given vitamin K intravenously. Case has been discussed with Louie Bun of [facility] agrees to admit the patient to stepdown unit. Case is also discussed with Dr. Bosie Clos who is a gastroenterologist who took care of her in the hospital. He is going to come into the ED to perform an endoscopy and has requested that she be started on octreotide.  Dione Booze, MD 09/11/14 [phone]

## 2014-09-11 NOTE — Consult Note (Addendum)
Chief Complaint: Patient was seen in consultation today for gastric variceal bleeding at the request of Dr. Doy Mince.  Referring Physician(s): Charlott Rakes, M.D.  History of Present Illness: Melanie Conley is a 24 y.o. 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  EGD by Dr. Bosie Clos yesterday revealed non-bleeding esophageal varices and an actively bleeding gastric varix along the proximal lesser curvature of the stomach.  2 bands fell off, but a third stayed in place, successfully stopping acute bleeding for now.  Patient has been evaluated by [facility] and is on the transplant list.  Baseline bilirubin in 4 range, but now up to 7.5.  Current MELD score up to 25 due to the higher bilirubin and INR of 2.64.  Intubated today.  Currently hemodynamically stable and receiving blood and FFP.  Intubated today.  Past Medical History  Diagnosis Date  . Anemia   . Esophageal varices   . Autoimmune hepatitis   . GIB (gastrointestinal bleeding)   . Cirrhosis   . Primary sclerosing cholangitis   . Irregular periods 08/04/2014  . Vaginal Pap smear, abnormal     Past Surgical History  Procedure Laterality Date  . Gastric banding port revision    . Tonsillectomy    . Esophagogastroduodenoscopy  07/22/2011    Procedure: ESOPHAGOGASTRODUODENOSCOPY (EGD);  Surgeon: Graylin Shiver, MD;  Location: [facility] ENDOSCOPY;  Service: Endoscopy;  Laterality: N/A;    Allergies: Review of patient's allergies indicates no known allergies.  Medications: Prior to Admission medications   Medication Sig Start Date End Date Taking? Authorizing Provider  azaTHIOprine (IMURAN) 50 MG tablet Take 50 mg by mouth daily. 06/21/14  Yes Historical Provider, MD  carvedilol (COREG) 3.125 MG tablet Take 3.125 mg by mouth 2 (two) times daily. 06/17/14  06/17/15 Yes Historical Provider, MD  ciprofloxacin (CIPRO) 500 MG tablet Take 1 tablet (500 mg total) by mouth 2 (two) times daily. 09/06/14  Yes Hollice Espy, MD  feeding supplement, ENSURE ENLIVE, (ENSURE ENLIVE) LIQD Take 237 mLs by mouth 2 (two) times daily between meals. 09/07/14  Yes Hollice Espy, MD  furosemide (LASIX) 40 MG tablet Take 1 tablet (40 mg total) by mouth daily. 06/05/14  Yes Shanker Levora Dredge, MD  lactose free nutrition (BOOST) LIQD Take 237 mLs by mouth daily.   Yes Historical Provider, MD  lactulose (CHRONULAC) 10 GM/15ML solution Take 30 mLs (20 g total) by mouth 3 (three) times daily. 09/07/14  Yes Hollice Espy, MD  lidocaine (XYLOCAINE) 2 % solution Use as directed 200 mg in the mouth or throat 3 (three) times daily as needed. For GI / esophageal upset 07/14/14  Yes Historical Provider, MD  metoCLOPramide (REGLAN) 5 MG tablet Take 5 mg by mouth 2 (two) times daily. 30 minutes before a meal 07/26/14  Yes Historical Provider, MD  metroNIDAZOLE (FLAGYL) 500 MG tablet Take 1 tablet (500 mg total) by mouth 3 (three) times daily. 09/06/14  Yes Hollice Espy, MD  omeprazole (PRILOSEC) 40 MG capsule Take 40 mg by mouth daily.   Yes Historical Provider, MD  potassium chloride (K-DUR) 10 MEQ tablet Take 1 tablet (10 mEq total) by mouth once. 08/08/14  Yes Azalia Bilis, MD  predniSONE (DELTASONE) 5 MG tablet Take 5 mg by mouth daily.   Yes Historical Provider, MD  Protein POWD Take 1 scoop by mouth 2 (  two) times daily.   Yes Historical Provider, MD  spironolactone (ALDACTONE) 50 MG tablet Take 100 mg by mouth daily.    Yes Historical Provider, MD  ursodiol (ACTIGALL) 300 MG capsule Take 300 mg by mouth 2 (two) times daily.    Yes Historical Provider, MD  valGANciclovir (VALCYTE) 450 MG tablet Take 900 mg by mouth daily. 07/08/14 10/06/14 Yes Historical Provider, MD  albuterol (PROVENTIL) (2.5 MG/3ML) 0.083% nebulizer solution Take 2.5 mg by nebulization every 4 (four) hours as  needed for wheezing or shortness of breath.  07/14/14   Historical Provider, MD     Family History  Problem Relation Age of Onset  . Adopted: Yes    Social History   Social History  . Marital Status: Single    Spouse Name: N/A  . Number of Children: N/A  . Years of Education: N/A   Social History Main Topics  . Smoking status: Former Smoker -- 0.00 packs/day for 0 years  . Smokeless tobacco: Never Used  . Alcohol Use: No  . Drug Use: No  . Sexual Activity: Yes    Birth Control/ Protection: None   Other Topics Concern  . 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       History of Present Illness: Patient is a pleasant 24 year old [place] American female who was diagnosed with liver disease at age 64 when she presented with a GI bleed. She has undergone liver biopsy and ERCP in 2003. 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 Her complications of cirrhosis include bleeding esophageal varicies, ascites, SBP and encephalopathy. She was listed for OLT at [facility] in 2006 but made status 7 due to a low MELD score and a good quality of life. We have been unable to reactivate her as an adult because she has not passed the psychosocial evaluation. She has undergone formal neuropsych testing. In the past, she has stated that she does not wish to undergo OLT even if she needs it. In regards to her AIH, her serologies are negative and she has been on Imuran and prednisone since her diagnosis in 2003. Regarding her PSC, she has had one bout of cholangitis years ago per report. She denies a history of stenting or dominant biliary stricture or choledolcholithiasis. She was previously on high dose ursodiol.       She presents today with her Mom. I last saw her in Feb 2015. She has been hospitalized here at [facility] for abdominal pain/worsening ascites in 11/2013 and the again at  [facility] in 12/2013 for SBP despite being on Levoquin prophylaxis. Her outside records from 12/2013 were reviewed in Care Everywhere. Ascites fluid showed 7866 nuc cells with 83% polys. Blood Ctx were negative. She had 3 days of IV Rocephin with IV albumin on D1 and D3 as well as 7 days of Augmentin. She also had a 2.7L paracentesis. Stool studies were neg (C diff and immunocompromised host panel). She also had a CT scan which was unremarkable for acute event- patent PV. She denies any worsening jaundice, pruritis, SOB, CP, increased abdominal girth, LE edema, melena, BRBPR, confusion, depression, nausea, vomiting, or diarrhea. Her abdominal pain has resolved and her ascites is well controlled on aldactone 100mg  and lasix 40mg  daily. She is following a low Na diet.  Cirrhosis Care:    1.HCC screen: MRI 09/06/13 and CT scan 12/13/13 cirrhosis, ascites, marked splenomegaly, no masses, biliary stricturing   2.Varicies surveillance: EGD 07/17/13 Grade II esophageal varices with EVL X 2. Missed follow-up.   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    -- Liver biopsy 08/03/01 minute fragments bile duct proliferation and periductular fibrosis; hepatitis G1-2, S2; ERCP 09/23/01 Subtle changes/narrowing but no dominant strictures; MRCP images 10/08 showed focal narrowing in right hepatic system; serologies ANA neg ASMA neg, antiLKMI neg AMA neg IgG normal (low level + ASMA 1:20 in 2003)    -- Occult GI bleeding with many EGD's, VCE 11/09 normal, Colonoscopy 7/06 and ?2/10 with biopsies negative for IBD including TI.    -- seasonal allergies    -- s/p tonsilectomy    -- Immune to HAV and HBV (2007)    -- EBV IgG pos CMV IgG and IgM neg       Social  History:    Lives near [place], [place] with parents sister and brother. Myrle is the middle child (she is adopted).   Her Mom teaches Ambulance person.    No tobacco or alcohol.    Kortni had been at [employer] studying early childhood development but is currently working in Warden/ranger at [employer].   Pets: 1 dog and 1 cat        Review of Systems: A 12 point ROS discussed and pertinent positives are indicated in the HPI above.  All other systems are negative.  Review of Systems  Vital Signs: BP 129/74 mmHg  Pulse 101  Temp(Src) 98.2 F (36.8 C) (Oral)  Resp 16  Ht 4\' 11"  (1.499 m)  Wt 99 lb 3 oz (44.991 kg)  BMI 20.02 kg/m2  SpO2 100%  Physical Exam  Mallampati Score:  MD Evaluation Airway: WNL Heart: WNL Abdomen: Other (comments) Abdomen comments: distended, tender Chest/ Lungs: WNL ASA  Classification: 4 Mallampati/Airway Score: Two  Imaging: Ct Head Wo Contrast  09/11/2014   CLINICAL DATA:  Lethargy and confusion.  EXAM: CT HEAD WITHOUT CONTRAST  TECHNIQUE: Contiguous axial images were obtained from the base of the skull through the vertex without intravenous contrast.  COMPARISON:  None.  FINDINGS: The brain demonstrates no evidence of hemorrhage, infarction, edema, mass effect, extra-axial fluid collection, hydrocephalus or mass lesion. The skull is unremarkable.  IMPRESSION: Normal head CT.   Electronically Signed   By: Irish Lack M.D.   On: 09/11/2014 08:15   Ct Abdomen Pelvis W Contrast  09/05/2014   CLINICAL DATA:  Acute onset of periumbilical abdominal pain. Initial encounter.  EXAM: CT ABDOMEN AND PELVIS WITH CONTRAST  TECHNIQUE: Multidetector CT imaging of the abdomen and pelvis was performed using the standard protocol following bolus administration of intravenous contrast.  CONTRAST:  75mL OMNIPAQUE IOHEXOL 300 MG/ML  SOLN  COMPARISON:  CT of the abdomen and pelvis performed 08/08/2014  FINDINGS: The visualized lung bases are  clear.  Large volume ascites is again noted within the abdomen and pelvis, perhaps slightly decreased from the prior study.  Underlying changes of hepatic cirrhosis are again noted, with slight nodularity of the hepatic contour. 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 Splenomegaly is again noted. The  spleen measures 16.3 cm in length. Scattered splenic, gastric and esophageal varices are again noted. The portal venous system appears grossly patent.  The pancreas and adrenal glands are grossly unremarkable in appearance. The kidneys are unremarkable in appearance. No perinephric stranding is appreciated. No renal or ureteral stones are identified. There is no evidence of hydronephrosis.  No free fluid is identified. The small bowel is unremarkable in appearance. The stomach is within normal limits. No acute vascular abnormalities are seen.  The appendix is normal in caliber and contains air, without evidence of appendicitis. There is new mucosal edema involving the entirety of the colon. Though this may reflect hypoproteinemia or other systemic condition, mild infectious or inflammatory colitis cannot be excluded.  The bladder is mildly distended and grossly unremarkable. Scattered calcification is noted along a 1.4 cm urachal nodule. The uterus is unremarkable in appearance. The ovaries are relatively symmetric. No suspicious adnexal masses are seen. No inguinal lymphadenopathy is seen.  No acute osseous abnormalities are identified.  IMPRESSION: 1. New mucosal edema involving the entirety of the colon. Though this may reflect hypoproteinemia or other systemic condition, mild infectious or inflammatory colitis cannot be excluded. 2. Large volume ascites again noted within the abdomen and pelvis, perhaps slightly decreased from the prior study. 3. Underlying changes of hepatic cirrhosis again noted, with splenomegaly. Multiple splenic infarcts again seen. Scattered splenic, gastric and esophageal varices again  noted. 4. 1.4 cm urachal nodule, with scattered constipation, appears grossly stable from prior studies.   Electronically Signed   By: Roanna Raider M.D.   On: 09/05/2014 01:57   US Paracentesis  09/05/2014   INDICATION: Cirrhosis, recurrent ascites and request for paracentesis.  EXAM: ULTRASOUND-GUIDED PARACENTESIS  COMPARISON:  Paracentesis 07/30/14.  MEDICATIONS: None.  COMPLICATIONS: None immediate  TECHNIQUE: Informed written consent was obtained from the patient after a discussion of the risks, benefits and alternatives to treatment. A timeout was performed prior to the initiation of the procedure.  Initial ultrasound scanning demonstrates a small amount of ascites within the left lower abdominal quadrant. The left lower abdomen was prepped and draped in the usual sterile fashion. 1% lidocaine was used for local anesthesia.  Under direct ultrasound guidance, a 19 gauge, 7-cm, Yueh catheter was introduced. An ultrasound image was saved for documentation purposed. The paracentesis was performed. The catheter was removed and a dressing was applied. The patient tolerated the procedure well without immediate post procedural complication.  FINDINGS: A total of approximately 2 liters of serous fluid was removed. Samples were sent to the laboratory as requested by the clinical team.  IMPRESSION: Successful ultrasound-guided paracentesis yielding 2 liters of peritoneal fluid.  Read By:  Pattricia Boss PA-C   Electronically Signed   By: Jolaine Click M.D.   On: 09/05/2014 10:31   Dg Chest Port 1 View  09/11/2014   CLINICAL DATA:  24 year old female with sepsis. Intubated and central line placement. Initial encounter.  EXAM: PORTABLE CHEST - 1 VIEW  COMPARISON:  0955 hours today, and earlier  FINDINGS: Portable AP semi upright view at 1219 hours. Endotracheal tube tip is at the carina and directed slightly toward the right mainstem bronchus. Right IJ central line is been placed, tip projects at the lower SVC level.  No  pneumothorax. Left lower lobe collapse or consolidation is new. No pulmonary edema or pleural effusion. Moderate to severe gaseous distension of the stomach is new. Normal cardiac size and mediastinal contours.  IMPRESSION: 1. Intubated, endotracheal tube tip at the carina  or just inside the right mainstem bronchus. Retract 3 cm for placement at the level of the clavicles. 2. Left lower lobe collapse/consolidation. Moderate to severe gaseous distension of the stomach. 3. Right IJ central line placed, tip at the lower SVC level. No pneumothorax. Study discussed by telephone with 2 Heart RN Delphia Grates on 09/11/2014 at 1316 hrs.   Electronically Signed   By: Odessa Fleming M.D.   On: 09/11/2014 13:20   Dg Chest Port 1 View  09/11/2014   CLINICAL DATA:  24 year old female with sepsis. Initial encounter.  EXAM: PORTABLE CHEST - 1 VIEW  COMPARISON:  0631 hours today, and earlier  FINDINGS: Portable AP semi upright view at 0955 hours. Continued low lung volumes. Allowing for portable technique, the lungs are clear. No pneumothorax or pneumoperitoneum. Normal cardiac size and mediastinal contours.  IMPRESSION: No acute cardiopulmonary abnormality.   Electronically Signed   By: Odessa Fleming M.D.   On: 09/11/2014 10:33   Dg Chest Port 1 View  09/11/2014   CLINICAL DATA:  Shortness of breath.  EXAM: PORTABLE CHEST - 1 VIEW  COMPARISON:  August 11, 2014.  FINDINGS: The heart size and mediastinal contours are within normal limits. Both lungs are clear. No pneumothorax or pleural effusion is noted. The visualized skeletal structures are unremarkable.  IMPRESSION: No acute cardiopulmonary abnormality seen.   Electronically Signed   By: Lupita Raider, M.D.   On: 09/11/2014 07:20    Labs:  CBC:  Recent Labs  09/06/14 0822 09/06/14 0925 09/07/14 0547 09/11/14 0142 09/11/14 0826  WBC 4.2 6.2 5.7 12.6*  --   HGB 8.7* 9.6* 8.9* 10.4* 7.1*  HCT 25.9* 28.9* 26.7* 31.6* 21.8*  PLT 53* 63* 52* 137*  --     COAGS:  Recent  Labs  06/08/14 1446 07/29/14 2318 07/30/14 0350 09/05/14 0733 09/11/14 0632  INR 1.48 1.39  --  1.69* 2.64*  APTT  --   --  34  --   --     BMP:  Recent Labs  09/05/14 0733 09/06/14 0925 09/07/14 0547 09/11/14 0142  NA 135 131* 131* 132*  K 3.0* 3.3* 3.9 2.8*  CL 98* 99* 101 96*  CO2 29 24 26 27   GLUCOSE 110* 130* 130* 115*  BUN 11 7 8 10   CALCIUM 8.1* 8.4* 8.2* 7.8*  CREATININE 0.64 0.63 0.61 0.61  GFRNONAA >60 >60 >60 >60  GFRAA >60 >60 >60 >60    LIVER FUNCTION TESTS:  Recent Labs  09/04/14 2053 09/05/14 0733 09/07/14 0547 09/11/14 0142  BILITOT 7.4* 5.7* 3.8* 7.5*  AST 57* 42* 29 114*  ALT 41 34 26 60*  ALKPHOS 465* 381* 330* 448*  PROT 6.2* 5.2* 4.8* 5.7*  ALBUMIN 2.8* 2.4* 2.2* 2.6*    Assessment and Plan:  Critically ill patient.  Awaiting mother to get to hospital to have discussion regarding indications and risks of BRTO plus/minus TIPS to treat the bleeding gastric varix if she bleeds again.  CT of 8/8 reviewed, demonstrating lesser curvature gastric varices that drain into a large descending shunt communicating with a circumaortic left renal vein.  Supply appears to be primarily from the left gastric vein off of the portal vein.  At a bilirubin of 7.5, Ms. Mccaughey' mortality is likely greater than 50% with any procedure.  TIPS is currently contraindicated at a MELD of 25 unless simply for access to the left gastric vein supply on the portal side of the bleeding varix/varices.  Markedly attenuated appearing  right portal system on CT would also make a TIPS technically more difficult.  BRTO would be treatment of choice for treating a recurrent gastric variceal bleed.  With acute decompensation of cirrhosis, there is an 8-10% chance of paradoxical liver failure after BRTO.  Optimal situation for survival would be to maximize medical management to improve acute decompensation and get the bilirubin closer to her baseline.  This would decrease risk of mortality  with intervention.  Intervention would require general anesthesia.  Will make Anesthesia aware of Ms. Skipper.  I am awaiting speaking to the mother about possible intervention if there is another acute bleed and will need to obtain informed consent from her.  Detailed conversation with patient's mother, Meagan Ancona, around 1500 hours describing grave condition of her daughter's condition and current high risk of intervention given significant acute decompensation with significantly higher MELD score than baseline.  Details of BRTO of gastric varices and possible need for TIPS discussed with her.  Informed consent obtained from mother and risks outlined, including bleeding, infection, hepatic failure, worsening ascites, worsening encephalopathy, organ damage, vascular injury and death.  Thank you for this interesting consult.  I greatly enjoyed meeting TIONNA GIGANTE and look forward to participating in their care.  A copy of this report was sent to the requesting provider on this date.  SignedIrish Lack T 09/11/2014, 2:23 PM     I spent a total of 40 Minutes in face to face in clinical consultation, greater than 50% of which was counseling/coordinating care for acute gastric variceal bleeding.

## 2014-09-11 NOTE — Brief Op Note (Signed)
Bleeding gastric varix and s/p band ligation but high risk of dislodgement of band and rebleeding. Dr. Fredia Sorrow of IR consulted and surgery consulted as well by Dr. Molli Knock. If rebleeding develops, then needs BRTO vs TIPS vs surgery. See endopro note for details.

## 2014-09-11 NOTE — H&P (Signed)
[facility] History and Physical                                                                                    Sury Wentworth, is a 24 y.o. female  MRN: [id number]   DOB - [birth year]  Admit Date - 09/11/2014  Outpatient Primary MD for the patient is Woodfin Ganja, MD  Referring Physician:  Dr. Preston Fleeting  Chief Complaint:   Chief Complaint  Patient presents with  . Weakness  . Nausea  . Abdominal Pain     HPI  Dashauna Heymann  is a 24 y.o. female, with autoimmune hepatitis, and primary sclerosing cholangitis, and known esophageal varices who presents to the emergency department in the early a.m. of 8/14 with weakness and abdominal pain.  She had a recent admission from 8/7-8/10 for colitis with hepatic encephalopathy. A CT scan was completed on 8/8 that showed new mucosal edema involving the entirety of the colon -  inflammatory colitis could not be excluded. At that time she also had large-volume ascites.  She was treated and discharged on Cipro/ Flagyl as well as lactulose twice a day. This morning in the ER she is encephalopathic and unable to give a detailed history. She does complain of umbilical pain. Per the RN  the patient produced a large amount of bright red blood per rectum (enough to overflow a bed pan) at approximately 8 AM this morning.  In the ER initial temperature was 95.9, pulse rate ranged from 101-138, white count 12.6 indicating severe sepsis. Her hemoglobin was 10.4, potassium 2.8, INR 2.64, lactic acid 3.4,  Ammonia 164.  Blood pressure was initially soft but improved with 2 L of IV fluids.  Temperature normalized with the use of a bair hugger.    [facility] gastroenterology has been consulted. We have spoken with Dr. Charlott Rakes who will see the patient this morning and move forward with colonoscopy.  He advised to start the patient on octreotide as precautionary measure.  Critical care has been consulted due to severe sepsis and likelihood of decompensation.  We  have also spoken with Dr. Ruffin Frederick, [facility] hepatologist on call, who requests that the patient be transferred to [facility] once she is stable. He is reachable on the [facility] transfer line  [phone].  Review of Systems   Patient is encephalopathic and unable to give a detailed review of systems.   Past Medical History  Past Medical History  Diagnosis Date  . Liver disease   . Hepatitis   . Anemia   . Esophageal varices   . Autoimmune hepatitis   . GIB (gastrointestinal bleeding)   . Cirrhosis   . Primary sclerosing cholangitis   . Irregular periods 08/04/2014  . Vaginal Pap smear, abnormal     Past Surgical History  Procedure Laterality Date  . Gastric banding port revision    . Tonsillectomy    . Esophagogastroduodenoscopy  07/22/2011    Procedure: ESOPHAGOGASTRODUODENOSCOPY (EGD);  Surgeon: Graylin Shiver, MD;  Location: [facility];  Service: Endoscopy;  Laterality: N/A;      Social History Social History  Substance Use Topics  . Smoking status: Current Every Day Smoker -- 0.50 packs/day  for 0 years    Types: Cigarettes  . Smokeless tobacco: Never Used  . Alcohol Use: No  currently lives with her boyfriend.  Family History Family History  Problem Relation Age of Onset  . Adopted: Yes    Prior to Admission medications   Medication Sig Start Date End Date Taking? Authorizing Provider  azaTHIOprine (IMURAN) 50 MG tablet Take 50 mg by mouth daily. 06/21/14  Yes Historical Provider, MD  carvedilol (COREG) 3.125 MG tablet Take 3.125 mg by mouth 2 (two) times daily. 06/17/14 06/17/15 Yes Historical Provider, MD  ciprofloxacin (CIPRO) 500 MG tablet Take 1 tablet (500 mg total) by mouth 2 (two) times daily. 09/06/14  Yes Hollice Espy, MD  feeding supplement, ENSURE ENLIVE, (ENSURE ENLIVE) LIQD Take 237 mLs by mouth 2 (two) times daily between meals. 09/07/14  Yes Hollice Espy, MD  furosemide (LASIX) 40 MG tablet Take 1 tablet (40 mg total) by mouth daily. 06/05/14  Yes Shanker Levora Dredge, MD  lactose free nutrition (BOOST) LIQD Take 237 mLs by mouth daily.   Yes Historical Provider, MD  lactulose (CHRONULAC) 10 GM/15ML solution Take 30 mLs (20 g total) by mouth 3 (three) times daily. 09/07/14  Yes Hollice Espy, MD  lidocaine (XYLOCAINE) 2 % solution Use as directed 200 mg in the mouth or throat 3 (three) times daily as needed. For GI / esophageal upset 07/14/14  Yes Historical Provider, MD  metoCLOPramide (REGLAN) 5 MG tablet Take 5 mg by mouth 2 (two) times daily. 30 minutes before a meal 07/26/14  Yes Historical Provider, MD  metroNIDAZOLE (FLAGYL) 500 MG tablet Take 1 tablet (500 mg total) by mouth 3 (three) times daily. 09/06/14  Yes Hollice Espy, MD  omeprazole (PRILOSEC) 40 MG capsule Take 40 mg by mouth daily.   Yes Historical Provider, MD  potassium chloride (K-DUR) 10 MEQ tablet Take 1 tablet (10 mEq total) by mouth once. 08/08/14  Yes Azalia Bilis, MD  predniSONE (DELTASONE) 5 MG tablet Take 5 mg by mouth daily.   Yes Historical Provider, MD  Protein POWD Take 1 scoop by mouth 2 (two) times daily.   Yes Historical Provider, MD  spironolactone (ALDACTONE) 50 MG tablet Take 100 mg by mouth daily.    Yes Historical Provider, MD  ursodiol (ACTIGALL) 300 MG capsule Take 300 mg by mouth 2 (two) times daily.    Yes Historical Provider, MD  valGANciclovir (VALCYTE) 450 MG tablet Take 900 mg by mouth daily. 07/08/14 10/06/14 Yes Historical Provider, MD  albuterol (PROVENTIL) (2.5 MG/3ML) 0.083% nebulizer solution Take 2.5 mg by nebulization every 4 (four) hours as needed for wheezing or shortness of breath.  07/14/14   Historical Provider, MD    No Known Allergies  Physical Exam  Vitals  Blood pressure 103/66, pulse 135, temperature 99.3 F (37.4 C), temperature source Oral, resp. rate 22, height 4\' 11"  (1.499 m), weight 44.991 kg (99 lb 3 oz), SpO2 100 %.   General:  Thin, African-American female,lying in bed, calm. Mildly confused. Appropriate but slightly slow  to respond.  Neuro:  Slightly lethargic. No F.N deficits, ALL C.Nerves Intact, Strength 5/5 all 4 extremities, Sensation intact all 4 extremities.  ENT:  Ears and Eyes appear Normal, Conjunctivae clear, sclera are mildly icteric. PER. Moist oral mucosa without erythema or exudates.  Neck:  Supple, No lymphadenopathy appreciated  Respiratory:  Symmetrical chest wall movement, Good air movement bilaterally, CTAB.  Cardiac:  RRR, No Murmurs, no LE edema noted, no  JVD.    Abdomen:  Positive bowel sounds, Soft, Non tender, Non distended,  No masses appreciated  Skin:  No Cyanosis, Normal Skin Turgor, No Skin Rash or Bruise.  Extremities:  Able to move all 4. 5/5 strength in each,  no effusions.  Data Review  CBC  Recent Labs Lab 09/05/14 0808 09/06/14 0822 09/06/14 0925 09/07/14 0547 09/11/14 0142  WBC 5.0 4.2 6.2 5.7 12.6*  HGB 6.5* 8.7* 9.6* 8.9* 10.4*  HCT 20.5* 25.9* 28.9* 26.7* 31.6*  PLT 91* 53* 63* 52* 137*  MCV 99.0 93.5 93.5 94.0 91.9  MCH 31.4 31.4 31.1 31.3 30.2  MCHC 31.7 33.6 33.2 33.3 32.9  RDW 18.1* 17.7* 18.0* 18.1* 17.2*  LYMPHSABS 0.8  --   --   --  2.1  MONOABS 0.7  --   --   --  2.3*  EOSABS 0.0  --   --   --  0.1  BASOSABS 0.0  --   --   --  0.0    Chemistries   Recent Labs Lab 09/04/14 2053 09/05/14 0733 09/06/14 0925 09/07/14 0547 09/11/14 0142  NA 135 135 131* 131* 132*  K 2.5* 3.0* 3.3* 3.9 2.8*  CL 97* 98* 99* 101 96*  CO2 29 29 24 26 27   GLUCOSE 107* 110* 130* 130* 115*  BUN 12 11 7 8 10   CREATININE 0.66 0.64 0.63 0.61 0.61  CALCIUM 8.6* 8.1* 8.4* 8.2* 7.8*  MG  --  1.6*  --   --  1.8  AST 57* 42*  --  29 114*  ALT 41 34  --  26 60*  ALKPHOS 465* 381*  --  330* 448*  BILITOT 7.4* 5.7*  --  3.8* 7.5*    Coagulation profile  Recent Labs Lab 09/05/14 0733 09/11/14 0632  INR 1.69* 2.64*    Urinalysis    Component Value Date/Time   COLORURINE AMBER* 09/11/2014 0700   APPEARANCEUR CLEAR 09/11/2014 0700   LABSPEC 1.021  09/11/2014 0700   PHURINE 6.5 09/11/2014 0700   GLUCOSEU NEGATIVE 09/11/2014 0700   HGBUR NEGATIVE 09/11/2014 0700   BILIRUBINUR SMALL* 09/11/2014 0700   KETONESUR NEGATIVE 09/11/2014 0700   PROTEINUR NEGATIVE 09/11/2014 0700   UROBILINOGEN 0.2 09/11/2014 0700   NITRITE NEGATIVE 09/11/2014 0700   LEUKOCYTESUR NEGATIVE 09/11/2014 0700    Imaging results:   Ct Head Wo Contrast  09/11/2014   CLINICAL DATA:  Lethargy and confusion.  EXAM: CT HEAD WITHOUT CONTRAST  TECHNIQUE: Contiguous axial images were obtained from the base of the skull through the vertex without intravenous contrast.  COMPARISON:  None.  FINDINGS: The brain demonstrates no evidence of hemorrhage, infarction, edema, mass effect, extra-axial fluid collection, hydrocephalus or mass lesion. The skull is unremarkable.  IMPRESSION: Normal head CT.   Electronically Signed   By: Irish Lack M.D.   On: 09/11/2014 08:15   Ct Abdomen Pelvis W Contrast  09/05/2014   CLINICAL DATA:  Acute onset of periumbilical abdominal pain. Initial encounter.  EXAM: CT ABDOMEN AND PELVIS WITH CONTRAST  TECHNIQUE: Multidetector CT imaging of the abdomen and pelvis was performed using the standard protocol following bolus administration of intravenous contrast.  CONTRAST:  75mL OMNIPAQUE IOHEXOL 300 MG/ML  SOLN  COMPARISON:  CT of the abdomen and pelvis performed 08/08/2014  FINDINGS: The visualized lung bases are clear.  Large volume ascites is again noted within the abdomen and pelvis, perhaps slightly decreased from the prior study.  Underlying changes of hepatic cirrhosis are again  noted, with slight nodularity of the hepatic contour. 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 Splenomegaly is again noted. The spleen measures 16.3 cm in length. Scattered splenic, gastric and esophageal varices are again noted. The portal venous system appears grossly patent.  The pancreas and adrenal glands are grossly unremarkable in appearance. The kidneys are  unremarkable in appearance. No perinephric stranding is appreciated. No renal or ureteral stones are identified. There is no evidence of hydronephrosis.  No free fluid is identified. The small bowel is unremarkable in appearance. The stomach is within normal limits. No acute vascular abnormalities are seen.  The appendix is normal in caliber and contains air, without evidence of appendicitis. There is new mucosal edema involving the entirety of the colon. Though this may reflect hypoproteinemia or other systemic condition, mild infectious or inflammatory colitis cannot be excluded.  The bladder is mildly distended and grossly unremarkable. Scattered calcification is noted along a 1.4 cm urachal nodule. The uterus is unremarkable in appearance. The ovaries are relatively symmetric. No suspicious adnexal masses are seen. No inguinal lymphadenopathy is seen.  No acute osseous abnormalities are identified.  IMPRESSION: 1. New mucosal edema involving the entirety of the colon. Though this may reflect hypoproteinemia or other systemic condition, mild infectious or inflammatory colitis cannot be excluded. 2. Large volume ascites again noted within the abdomen and pelvis, perhaps slightly decreased from the prior study. 3. Underlying changes of hepatic cirrhosis again noted, with splenomegaly. Multiple splenic infarcts again seen. Scattered splenic, gastric and esophageal varices again noted. 4. 1.4 cm urachal nodule, with scattered constipation, appears grossly stable from prior studies.   Electronically Signed   By: Roanna Raider M.D.   On: 09/05/2014 01:57   US Paracentesis  09/05/2014   INDICATION: Cirrhosis, recurrent ascites and request for paracentesis.  EXAM: ULTRASOUND-GUIDED PARACENTESIS  COMPARISON:  Paracentesis 07/30/14.  MEDICATIONS: None.  COMPLICATIONS: None immediate  TECHNIQUE: Informed written consent was obtained from the patient after a discussion of the risks, benefits and alternatives to treatment. A  timeout was performed prior to the initiation of the procedure.  Initial ultrasound scanning demonstrates a small amount of ascites within the left lower abdominal quadrant. The left lower abdomen was prepped and draped in the usual sterile fashion. 1% lidocaine was used for local anesthesia.  Under direct ultrasound guidance, a 19 gauge, 7-cm, Yueh catheter was introduced. An ultrasound image was saved for documentation purposed. The paracentesis was performed. The catheter was removed and a dressing was applied. The patient tolerated the procedure well without immediate post procedural complication.  FINDINGS: A total of approximately 2 liters of serous fluid was removed. Samples were sent to the laboratory as requested by the clinical team.  IMPRESSION: Successful ultrasound-guided paracentesis yielding 2 liters of peritoneal fluid.  Read By:  Pattricia Boss PA-C   Electronically Signed   By: Jolaine Click M.D.   On: 09/05/2014 10:31   Dg Chest Port 1 View  09/11/2014   CLINICAL DATA:  Shortness of breath.  EXAM: PORTABLE CHEST - 1 VIEW  COMPARISON:  August 11, 2014.  FINDINGS: The heart size and mediastinal contours are within normal limits. Both lungs are clear. No pneumothorax or pleural effusion is noted. The visualized skeletal structures are unremarkable.  IMPRESSION: No acute cardiopulmonary abnormality seen.   Electronically Signed   By: Lupita Raider, M.D.   On: 09/11/2014 07:20    My personal review of EKG: pending.   Assessment & Plan  Principal Problem:   Severe  sepsis with acute organ dysfunction Active Problems:   GI bleeding   Hypokalemia   Primary sclerosing cholangitis   Hepatic encephalopathy   Severe sepsis with acute on chronic liver failure Uncertain etiology. Blood and urine cultures are pending, chest x-ray is clear, UA does not appear infected. She had recent paracentesis (8/8) & ruled out SBP.  May consider repeat if there is no source of sepsis in 24 - 48 hours.   BP  stabilized with 2L of fluid.  Started on stress dose solucortef (50 q 8).  Appreciate Critical care evaluation and recommendations.  GI Bleed BRBPR most likely lower bleed.  Gastroenterology consulted and plans to scope today. Patient has received Vitamin K 5 mg, FFP 2 units for an INR of 2.64.  This is increased from 1.69 on 8/8. 2 units of PRBCs are being held if transfusion is needed.  Hgb was 10.4 on admit.  Will cycle CBCs q 6 hours.  Acute on Chronic Liver Failure with Encephalopathy Encephalopathy possibly due to GI bleed.   While significantly elevated, LFTs are approximately where they were on 8/7. She is on Valcyte for a hx of CMV.  Will order IV ganciclovir per pharmacy. She is on the transplant list at [facility]. I've spoken with Dr. Ruffin Frederick, , hepatologist on call who would like for her to be transferred as soon as she is stable. Will start oral lactulose and rifaximin as soon as she is able to take oral meds.  Hypokalemia (2.8) Likely due to GI losses. Not tolerating IV potassium Will place it in her IV fluids and Give her one dose of oral solution now. Continue to replete post colonoscopy.    Consultants Called:  [facility], Dr. Charlott Rakes, and Critical Care, Dr. Jefm Miles  Family Communication:   Spoke with Mother, Jaileigh Weimer.  Code Status:  Full code  Condition:  Guarded.  Likely to decompensate quickly.  Potential Disposition: To be determined based on further work up.  Being admitted to step down.  Time spent in minutes : [address]  PA-C on 09/11/2014 at 8:49 AM Between 7am to 7pm - Pager - [phone] After 7pm go to [email or web address] - password [email or web address] And look for the night coverage person covering me after hours   Attending  Patient was seen, examined,treatment plan was discussed with the Physician extender. I have directly reviewed the clinical findings, lab, imaging studies and management of this patient in detail. I have made the  necessary changes to the above noted documentation, and agree with the documentation, as recorded by the Physician extender.  24 year old female with a history of autoimmune hepatitis and sclerosing cholangitis presents with abdominal pain. The patient is confused and is unable to provide any significant history. The patient was recently discharged from the hospital on 09/07/2014 with abdominal pain. CT of the abdomen and pelvis at that time suggested colitis. Paracentesis was performed but did not show or suggest peritonitis. She was discharged with Cipro and Flagyl for 4 additional days. It is unclear whether she actually took the medication at this point. During her emergency department stay, the patient had a large volume hematochezia. The patient was started to be hypothermic with temperature 95.9. At the time of admission, her hemoglobin was 10.4. Baseline hemoglobin is 8-9. The patient was found to be hypothermic with soft blood pressures with systolic blood pressure 100-110. Lactic acid was noted to be 3.4 with ammonia 164. Total bilirubin was 7.5, AST 114, ALT 60, phosphatase  448. WBC was 12.6. CT brain and chest x-ray were negative. Urinalysis was negative for pyuria. Pt had paracentesis on 09/05/14 which did not suggest SBP.  Pt being admitted with Sepsis and GI bleed. Blood cultures were obtained and the patient was started on vancomycin and Zosyn. The patient was started on intravenous fluids. 2 units PRBC was ordered. 2 units FFP and vitamin K were also ordered. The patient was started on Protonix drip as well as octreotide drip. GI and critical care were consulted. GI plans endoscopy on 09/11/2014. Potassium will be replaced. The patient was also placed on stress dose in the form of Solu-Cortef; therefore, I anticipate increase in WBC. Azathioprine will be held at this time due to sepsis. Although the patient's hemoglobin was tendinitis. There has not been enough time for equilibration of her  hemoglobin. We'll check serial hemoglobins. The patient's hepatologist was consulted at [facility]. The plan is to ultimately transfer the patient to [facility] once she is stabilized. At this point, we will hold off treatment of her hepatic encephalopathy at this time due to the patient's acute medical issues. Certainly, once her acute medical issues are improved, the patient can be started back on lactulose.   Catarina Hartshorn, DO [phone]

## 2014-09-11 NOTE — Procedures (Signed)
Central Venous Catheter Insertion Procedure Note Melanie Conley [id number] [birth year]  Procedure: Insertion of Central Venous Catheter Indications: Assessment of intravascular volume and Drug and/or fluid administration  Procedure Details Consent: Unable to obtain consent because of emergent medical necessity. Time Out: Verified patient identification, verified procedure, site/side was marked, verified correct patient position, special equipment/implants available, medications/allergies/relevent history reviewed, required imaging and test results available.  Performed  Maximum sterile technique was used including antiseptics, cap, gloves, gown, hand hygiene, mask and sheet. Skin prep: Chlorhexidine; local anesthetic administered A antimicrobial bonded/coated triple lumen catheter was placed in the right internal jugular vein using the Seldinger technique.  Evaluation Blood flow good Complications: No apparent complications Patient did tolerate procedure well. Chest X-ray ordered to verify placement.  CXR: pending.  U/S used in placement.  YACOUB,WESAM 09/11/2014, 11:46 AM

## 2014-09-11 NOTE — Consult Note (Addendum)
Referring Provider: Dr. Preston Fleeting Primary Care Physician:  Woodfin Ganja, MD Primary Gastroenterologist:  Gentry Fitz  Reason for Consultation:  GI bleed; Encephalopathy; Cirrhosis  HPI: Melanie Conley is a 24 y.o. female is a autoimmune hepatitis/PSC with decompensated cirrhosis who was in the hospital last week for abdominal pain and ascites and found to have diffuse colitis by CT imaging. No SBP during paracentesis last week. She is on treatment for CMV viremia. She reportedly has been placed back on the liver transplant list. She comes in today confused with signs of hepatic encephalopathy and she is unable to give me any consistent history about her confusion and does not recall bleeding. Complains of abdominal pain but unable to give me any history about the pain. She had a large amount of dark red blood from her rectum in the emergency room. No history of vomiting. She reports abdominal pain but cannot compare it to her last admission. Hypotension with SBP in the 90's and hypothermic in ER. Ammonia 164. TB 7.5 (3.8 on 09/07/14). ALP 448, AST 114, ALT 60. Hgb 10.4 on admit and now 7.1 (8.9 on 09/06/12). WBC 12.6. INR 2.64. No family in room during my evaluation but I spoke with her mother by phone. She thinks it is "2006" and is slow to tell her date of birth. Oriented to place. She has an appt at [facility] this week.    Past Medical History  Diagnosis Date  . Liver disease   . Hepatitis   . Anemia   . Esophageal varices   . Autoimmune hepatitis   . GIB (gastrointestinal bleeding)   . Cirrhosis   . Primary sclerosing cholangitis   . Irregular periods 08/04/2014  . Vaginal Pap smear, abnormal     Past Surgical History  Procedure Laterality Date  . Gastric banding port revision    . Tonsillectomy    . Esophagogastroduodenoscopy  07/22/2011    Procedure: ESOPHAGOGASTRODUODENOSCOPY (EGD);  Surgeon: Graylin Shiver, MD;  Location: [facility] ENDOSCOPY;  Service: Endoscopy;  Laterality: N/A;    Prior to  Admission medications   Medication Sig Start Date End Date Taking? Authorizing Provider  azaTHIOprine (IMURAN) 50 MG tablet Take 50 mg by mouth daily. 06/21/14  Yes Historical Provider, MD  carvedilol (COREG) 3.125 MG tablet Take 3.125 mg by mouth 2 (two) times daily. 06/17/14 06/17/15 Yes Historical Provider, MD  ciprofloxacin (CIPRO) 500 MG tablet Take 1 tablet (500 mg total) by mouth 2 (two) times daily. 09/06/14  Yes Hollice Espy, MD  feeding supplement, ENSURE ENLIVE, (ENSURE ENLIVE) LIQD Take 237 mLs by mouth 2 (two) times daily between meals. 09/07/14  Yes Hollice Espy, MD  furosemide (LASIX) 40 MG tablet Take 1 tablet (40 mg total) by mouth daily. 06/05/14  Yes Shanker Levora Dredge, MD  lactose free nutrition (BOOST) LIQD Take 237 mLs by mouth daily.   Yes Historical Provider, MD  lactulose (CHRONULAC) 10 GM/15ML solution Take 30 mLs (20 g total) by mouth 3 (three) times daily. 09/07/14  Yes Hollice Espy, MD  lidocaine (XYLOCAINE) 2 % solution Use as directed 200 mg in the mouth or throat 3 (three) times daily as needed. For GI / esophageal upset 07/14/14  Yes Historical Provider, MD  metoCLOPramide (REGLAN) 5 MG tablet Take 5 mg by mouth 2 (two) times daily. 30 minutes before a meal 07/26/14  Yes Historical Provider, MD  metroNIDAZOLE (FLAGYL) 500 MG tablet Take 1 tablet (500 mg total) by mouth 3 (three) times daily. 09/06/14  Yes  Hollice Espy, MD  omeprazole (PRILOSEC) 40 MG capsule Take 40 mg by mouth daily.   Yes Historical Provider, MD  potassium chloride (K-DUR) 10 MEQ tablet Take 1 tablet (10 mEq total) by mouth once. 08/08/14  Yes Azalia Bilis, MD  predniSONE (DELTASONE) 5 MG tablet Take 5 mg by mouth daily.   Yes Historical Provider, MD  Protein POWD Take 1 scoop by mouth 2 (two) times daily.   Yes Historical Provider, MD  spironolactone (ALDACTONE) 50 MG tablet Take 100 mg by mouth daily.    Yes Historical Provider, MD  ursodiol (ACTIGALL) 300 MG capsule Take 300 mg by mouth 2  (two) times daily.    Yes Historical Provider, MD  valGANciclovir (VALCYTE) 450 MG tablet Take 900 mg by mouth daily. 07/08/14 10/06/14 Yes Historical Provider, MD  albuterol (PROVENTIL) (2.5 MG/3ML) 0.083% nebulizer solution Take 2.5 mg by nebulization every 4 (four) hours as needed for wheezing or shortness of breath.  07/14/14   Historical Provider, MD    Scheduled Meds: . hydrocortisone sod succinate (SOLU-CORTEF) inj  50 mg Intravenous Q8H  . octreotide  100 mcg Intravenous Once  . ondansetron      . pantoprazole (PROTONIX) IV  40 mg Intravenous Q12H   Continuous Infusions: . sodium chloride 1,000 mL (09/11/14 0558)  . octreotide  (SANDOSTATIN)    IV infusion 50 mcg/hr (09/11/14 0845)  . pantoprazole (PROTONIX) IV    . pantoprozole (PROTONIX) infusion    . phytonadione (VITAMIN K) IV 5 mg (09/11/14 0840)  . piperacillin-tazobactam (ZOSYN)  IV    . vancomycin     PRN Meds:.  Allergies as of 09/11/2014  . (No Known Allergies)    Family History  Problem Relation Age of Onset  . Adopted: Yes    Social History   Social History  . Marital Status: Single    Spouse Name: N/A  . Number of Children: N/A  . Years of Education: N/A   Occupational History  . Not on file.   Social History Main Topics  . Smoking status: Current Every Day Smoker -- 0.50 packs/day for 0 years    Types: Cigarettes  . Smokeless tobacco: Never Used  . Alcohol Use: No  . Drug Use: No  . Sexual Activity: Yes    Birth Control/ Protection: None   Other Topics Concern  . 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       History of Present Illness: Patient is a pleasant 24 year old [place] American female who was diagnosed with liver disease at age 35 when she presented with a GI bleed. She has undergone liver biopsy and ERCP in 2003. 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 Her complications of cirrhosis include bleeding esophageal varicies, ascites, SBP and encephalopathy. She was listed for OLT at [facility] in 2006 but made status 7 due to a low MELD score and a good quality of life. We have been unable to reactivate her as an adult because she has not passed the psychosocial evaluation. She has undergone formal neuropsych testing. In the past, she has stated that she does not wish to undergo OLT even if she needs it. In regards to her AIH, her serologies are negative and she has been on Imuran and prednisone since her diagnosis in 2003.  Regarding her PSC, she has had one bout of cholangitis years ago per report. She denies a history of stenting or dominant biliary stricture or choledolcholithiasis. She was previously on high dose ursodiol.       She presents today with her Mom. I last saw her in Feb 2015. She has been hospitalized here at [facility] for abdominal pain/worsening ascites in 11/2013 and the again at [facility] in 12/2013 for SBP despite being on Levoquin prophylaxis. Her outside records from 12/2013 were reviewed in Care Everywhere. Ascites fluid showed 7866 nuc cells with 83% polys. Blood Ctx were negative. She had 3 days of IV Rocephin with IV albumin on D1 and D3 as well as 7 days of Augmentin. She also had a 2.7L paracentesis. Stool studies were neg (C diff and immunocompromised host panel). She also had a CT scan which was unremarkable for acute event- patent PV. She denies any worsening jaundice, pruritis, SOB, CP, increased abdominal girth, LE edema, melena, BRBPR, confusion, depression, nausea, vomiting, or diarrhea. Her abdominal pain has resolved and her ascites is well controlled on aldactone 100mg  and lasix 40mg  daily. She is following a low Na diet.      Cirrhosis Care:    1.HCC screen: MRI 09/06/13 and CT scan 12/13/13 cirrhosis, ascites, marked splenomegaly, no masses, biliary stricturing   2.Varicies surveillance: EGD 07/17/13 Grade II  esophageal varices with EVL X 2. Missed follow-up.   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    -- Liver biopsy 08/03/01 minute fragments bile duct proliferation and periductular fibrosis; hepatitis G1-2, S2; ERCP 09/23/01 Subtle changes/narrowing but no dominant strictures; MRCP images 10/08 showed focal narrowing in right hepatic system; serologies ANA neg ASMA neg, antiLKMI neg AMA neg IgG normal (low level + ASMA 1:20 in 2003)    -- Occult GI bleeding with many EGD's, VCE 11/09 normal, Colonoscopy 7/06 and ?2/10 with biopsies negative for IBD including TI.    -- seasonal allergies    -- s/p tonsilectomy    -- Immune to HAV and HBV (2007)    -- EBV IgG pos CMV IgG and IgM neg       Social History:    Lives near [place], [place] with parents sister and brother. Evania is the middle child (she is adopted).   Her Mom teaches Ambulance person.    No tobacco or alcohol.    Calli had been at [employer] at [employer] studying early childhood development but is currently working in Warden/ranger at [employer].   Pets: 1 dog and 1 cat        Review of Systems: Unable to obtain from patient (due to encephalopathy)  Physical Exam: Vital signs: Filed Vitals:   09/11/14 0850  BP: 113/63  Pulse: 134  Temp: 99.3  Resp: 29     General:   Lethargic, thin, oriented to place and person not time Head: atraumatic Eyes: +scleral icterus ENT: oropharynx clear Neck: supple, nontender Lungs:  Clear throughout to auscultation.   No wheezes, crackles, or rhonchi. No acute distress. Heart:  Regular rate and rhythm; no murmurs, clicks, rubs,  or gallops. Abdomen: diffuse tenderness with guarding, mild distention, +BS  Rectal:   Deferred Ext: pulses intact, no edema Neuro: +asterixis,  oriented to place and person not time Skin: no rash  GI:  Lab Results:  Recent Labs  09/11/14 0142  WBC 12.6*  HGB 10.4*  HCT 31.6*  PLT 137*   BMET  Recent Labs  09/11/14 0142  NA 132*  K 2.8*  CL 96*  CO2 27  GLUCOSE 115*  BUN 10  CREATININE 0.61  CALCIUM 7.8*   LFT  Recent Labs  09/11/14 0142  PROT 5.7*  ALBUMIN 2.6*  AST 114*  ALT 60*  ALKPHOS 448*  BILITOT 7.5*   PT/INR  Recent Labs  09/11/14 0632  LABPROT 27.8*  INR 2.64*     Studies/Results: Ct Head Wo Contrast  09/11/2014   CLINICAL DATA:  Lethargy and confusion.  EXAM: CT HEAD WITHOUT CONTRAST  TECHNIQUE: Contiguous axial images were obtained from the base of the skull through the vertex without intravenous contrast.  COMPARISON:  None.  FINDINGS: The brain demonstrates no evidence of hemorrhage, infarction, edema, mass effect, extra-axial fluid collection, hydrocephalus or mass lesion. The skull is unremarkable.  IMPRESSION: Normal head CT.   Electronically Signed   By: Irish Lack M.D.   On: 09/11/2014 08:15   Dg Chest Port 1 View  09/11/2014   CLINICAL DATA:  Shortness of breath.  EXAM: PORTABLE CHEST - 1 VIEW  COMPARISON:  August 11, 2014.  FINDINGS: The heart size and mediastinal contours are within normal limits. Both lungs are clear. No pneumothorax or pleural effusion is noted. The visualized skeletal structures are unremarkable.  IMPRESSION: No acute cardiopulmonary abnormality seen.   Electronically Signed   By: Lupita Raider, M.D.   On: 09/11/2014 07:20    Impression/Plan: 24 yo with decompensated cirrhosis who is have rectal bleeding likely related to her colitis but she needs an EGD to evaluate for a variceal source due to her severe decompensated state. MELD score now 25 (up from 20 last week). Octreotide drip; IV PPI Q 12 hours; Start IV antibiotics for GI bleed in the setting of cirrhosis. NPO. EGD bedside this morning.  Agree with Vit K and FFP to correct her coagulopathy. Lactulose and Rifaximin for her encephalopathy. Transfer to [facility] Hepatology when stabilized for ongoing management of her decompensated cirrhosis.    LOS: 0 days   Zong Mcquarrie C.  09/11/2014, 9:06 AM  Pager [phone]  If no answer or after 5 PM call [phone]

## 2014-09-11 NOTE — Consult Note (Signed)
PULMONARY / CRITICAL CARE MEDICINE   Name: Melanie Conley MRN: [id number] DOB: November 23, 1990    ADMISSION DATE:  09/11/2014 CONSULTATION DATE:  09/11/14  REFERRING MD :  Dr. Carles Collet   CHIEF COMPLAINT:  GIB   INITIAL PRESENTATION: 24 y/o F with PMH   STUDIES:  8/14  CT Head >> negative   SIGNIFICANT EVENTS: 8/14  Admit with weakness, tremor, lightheadedness and nausea / vomiting / diarrhea for 3 days.    HISTORY OF PRESENT ILLNESS:  24 y/o F with PMH of primary sclerosing cholangitis, auto-immune hepatitis, known esophageal varices, irregular periods and recent admit 09/04/14 - 09/07/14 for diffuse abdominal pain and worsening ascites.  At that time was found to have diffuse colitis on CT, admitted for IV ABX.and anemia with Hgb drop from 10.2 to 6.5, and confusion.  Paracentesis was performed which was negative for SBP.  She was discharged on cipro & flagyl for 4 more days (to be completed on 8/14). She presented to [facility] on 8/14 with reports of weakness, tremor, lightheadedness and nausea / vomiting / diarrhea for 3 days.  In the ER, she had a witnessed large bloody bowel movement.  Initial Hgb was 10.4 and dropped to 7.1.  She was admitted per [facility] for further care.  GI was consulted for evaluation.  She developed hypotension and PCCM consulted for evaluation.    Labs notable for Hgb of 7.1, platelets 137, Na 132, K 2.8, sr cr 0.61, glucose 115, alk phos 448, AST 114 / ALT 60, ammonia 164 and lactic acid 3.24.  CXR assessed and clear.  CT head assessed for AMS and was negative.  The patient underwent EGD the am of 8/14 and was found to have variceal bleeding.  She was emergently intubated for airway protection.  Esophageal varices were banded per GI.       PAST MEDICAL HISTORY :   has a past medical history of Liver disease; Hepatitis; Anemia; Esophageal varices; Autoimmune hepatitis; GIB (gastrointestinal bleeding); Cirrhosis; Primary sclerosing cholangitis; Irregular periods (08/04/2014); and Vaginal  Pap smear, abnormal.  has past surgical history that includes Gastric banding port revision; Tonsillectomy; and Esophagogastroduodenoscopy (07/22/2011).   Prior to Admission medications   Medication Sig Start Date End Date Taking? Authorizing Provider  azaTHIOprine (IMURAN) 50 MG tablet Take 50 mg by mouth daily. 06/21/14  Yes Historical Provider, MD  carvedilol (COREG) 3.125 MG tablet Take 3.125 mg by mouth 2 (two) times daily. 06/17/14 06/17/15 Yes Historical Provider, MD  ciprofloxacin (CIPRO) 500 MG tablet Take 1 tablet (500 mg total) by mouth 2 (two) times daily. 09/06/14  Yes Annita Brod, MD  feeding supplement, ENSURE ENLIVE, (ENSURE ENLIVE) LIQD Take 237 mLs by mouth 2 (two) times daily between meals. 09/07/14  Yes Annita Brod, MD  furosemide (LASIX) 40 MG tablet Take 1 tablet (40 mg total) by mouth daily. 06/05/14  Yes Shanker Kristeen Mans, MD  lactose free nutrition (BOOST) LIQD Take 237 mLs by mouth daily.   Yes Historical Provider, MD  lactulose (CHRONULAC) 10 GM/15ML solution Take 30 mLs (20 g total) by mouth 3 (three) times daily. 09/07/14  Yes Annita Brod, MD  lidocaine (XYLOCAINE) 2 % solution Use as directed 200 mg in the mouth or throat 3 (three) times daily as needed. For GI / esophageal upset 07/14/14  Yes Historical Provider, MD  metoCLOPramide (REGLAN) 5 MG tablet Take 5 mg by mouth 2 (two) times daily. 30 minutes before a meal 07/26/14  Yes Historical Provider, MD  metroNIDAZOLE (FLAGYL) 500 MG tablet Take 1 tablet (500 mg total) by mouth 3 (three) times daily. 09/06/14  Yes Annita Brod, MD  omeprazole (PRILOSEC) 40 MG capsule Take 40 mg by mouth daily.   Yes Historical Provider, MD  potassium chloride (K-DUR) 10 MEQ tablet Take 1 tablet (10 mEq total) by mouth once. 08/08/14  Yes Jola Schmidt, MD  predniSONE (DELTASONE) 5 MG tablet Take 5 mg by mouth daily.   Yes Historical Provider, MD  Protein POWD Take 1 scoop by mouth 2 (two) times daily.   Yes Historical Provider,  MD  spironolactone (ALDACTONE) 50 MG tablet Take 100 mg by mouth daily.    Yes Historical Provider, MD  ursodiol (ACTIGALL) 300 MG capsule Take 300 mg by mouth 2 (two) times daily.    Yes Historical Provider, MD  valGANciclovir (VALCYTE) 450 MG tablet Take 900 mg by mouth daily. 07/08/14 10/06/14 Yes Historical Provider, MD  albuterol (PROVENTIL) (2.5 MG/3ML) 0.083% nebulizer solution Take 2.5 mg by nebulization every 4 (four) hours as needed for wheezing or shortness of breath.  07/14/14   Historical Provider, MD   No Known Allergies  FAMILY HISTORY:  is adopted.   SOCIAL HISTORY:  reports that she has quit smoking. She has never used smokeless tobacco. She reports that she does not drink alcohol or use illicit drugs.  REVIEW OF SYSTEMS:  Unable to complete as patient is altered on vent.    SUBJECTIVE:   VITAL SIGNS: Temp:  [95.9 F (35.5 C)-99.3 F (37.4 C)] 98.7 F (37.1 C) (08/14 1015) Pulse Rate:  [101-138] 127 (08/14 1030) Resp:  [14-29] 23 (08/14 1030) BP: (92-135)/(54-91) 107/75 mmHg (08/14 1030) SpO2:  [82 %-100 %] 100 % (08/14 1030) Weight:  [99 lb 3 oz (44.991 kg)] 99 lb 3 oz (44.991 kg) (08/14 0109)   HEMODYNAMICS:     VENTILATOR SETTINGS:     INTAKE / OUTPUT:  Intake/Output Summary (Last 24 hours) at 09/11/14 1032 Last data filed at 09/11/14 0701  Gross per 24 hour  Intake      0 ml  Output    300 ml  Net   -300 ml    PHYSICAL EXAMINATION: General:  Chronically ill young adult female Neuro:  Awake, confused, MAE, speech clear HEENT:  MM pale, dry, jvd present Cardiovascular:  s1s2 rrr, tachy Lungs:  resp's even/non-labored, lungs bilaterally clear  Abdomen:  Distended, tender to palpation, BSx4 active  Musculoskeletal:  No acute deformities  Skin:  Warm/dry, no edema   LABS:  CBC  Recent Labs Lab 09/06/14 0925 09/07/14 0547 09/11/14 0142 09/11/14 0826  WBC 6.2 5.7 12.6*  --   HGB 9.6* 8.9* 10.4* 7.1*  HCT 28.9* 26.7* 31.6* 21.8*  PLT 63*  52* 137*  --    Coag's  Recent Labs Lab 09/05/14 0733 09/11/14 0632  INR 1.69* 2.64*   BMET  Recent Labs Lab 09/06/14 0925 09/07/14 0547 09/11/14 0142  NA 131* 131* 132*  K 3.3* 3.9 2.8*  CL 99* 101 96*  CO2 '24 26 27  ' BUN '7 8 10  ' CREATININE 0.63 0.61 0.61  GLUCOSE 130* 130* 115*   Electrolytes  Recent Labs Lab 09/05/14 0733 09/06/14 0925 09/07/14 0547 09/11/14 0142  CALCIUM 8.1* 8.4* 8.2* 7.8*  MG 1.6*  --   --  1.8  PHOS 2.8  --   --   --    Sepsis Markers  Recent Labs Lab 09/11/14 0606 09/11/14 0618 09/11/14 0827  LATICACIDVEN 3.24* 3.4*  3.2*   ABG No results for input(s): PHART, PCO2ART, PO2ART in the last 168 hours.   Liver Enzymes  Recent Labs Lab 09/05/14 0733 09/07/14 0547 09/11/14 0142  AST 42* 29 114*  ALT 34 26 60*  ALKPHOS 381* 330* 448*  BILITOT 5.7* 3.8* 7.5*  ALBUMIN 2.4* 2.2* 2.6*   Cardiac Enzymes No results for input(s): TROPONINI, PROBNP in the last 168 hours.   Glucose  Recent Labs Lab 09/11/14 0420  GLUCAP 166*    Imaging Ct Head Wo Contrast  09/11/2014   CLINICAL DATA:  Lethargy and confusion.  EXAM: CT HEAD WITHOUT CONTRAST  TECHNIQUE: Contiguous axial images were obtained from the base of the skull through the vertex without intravenous contrast.  COMPARISON:  None.  FINDINGS: The brain demonstrates no evidence of hemorrhage, infarction, edema, mass effect, extra-axial fluid collection, hydrocephalus or mass lesion. The skull is unremarkable.  IMPRESSION: Normal head CT.   Electronically Signed   By: Aletta Edouard M.D.   On: 09/11/2014 08:15   Dg Chest Port 1 View  09/11/2014   CLINICAL DATA:  Shortness of breath.  EXAM: PORTABLE CHEST - 1 VIEW  COMPARISON:  August 11, 2014.  FINDINGS: The heart size and mediastinal contours are within normal limits. Both lungs are clear. No pneumothorax or pleural effusion is noted. The visualized skeletal structures are unremarkable.  IMPRESSION: No acute cardiopulmonary  abnormality seen.   Electronically Signed   By: Marijo Conception, M.D.   On: 09/11/2014 07:20     ASSESSMENT / PLAN:  PULMONARY OETT 8/14 >>  A: Airway Protection / Respiratory Failure - in setting of GIB, sedation for EGD P:   Now intubation with esophageal varices MV support, 8 cc/kg Follow up CXR post intubation, ETT  Intermittent CXR   CARDIOVASCULAR CVL R IJ 8/14 >> A:  Hypotension - baseline BP low 100's, currently in 90's Baseline Prednisone 41m Dependent  P:  Insert TLC for volume resuscitation, assessment Volume resuscitation with PRBC's  Control source of bleeding, appreciate GI input   Stress dose steroids, adjust dosing (chronic baseline prednisone) Vasopressors if MAP < 60  RENAL A:   Hypokalemia  Hyponatremia  Lactic Acidosis  P:    Trend BMP / UOP  Replace electrolytes as indicated  Trend lactic acid  NS @ 75 ml/hr  GASTROINTESTINAL A:   Autoimmune Hepatitis / Primary Sclerosing Colangitis Abdominal Pain / Diffuse Colitis - s/p paracentesis 8/8, no SBP Elevated Ammonia  P:   GI Following  Pending EGD 8/14 am  [facility]  Hold coreg, aldactone, lasix for now with hypotension and concerns for acute bleeding NO NGT Continue protonix / octreotide gtt's   HEMATOLOGIC A:   Coagulopathy - elevated PT to 27.8, not on anticoagulation, INR rising to 2.64 from 1.69 on 8/8 P:  Monitor INR / PT trend  Vitamin K per TRH  DVT:  SCD's  Follow up H/H post transfusion   INFECTIOUS A:   Hx CMV Viremia  P:   BCx2 8/14 >> UC 8/14 >>   Vanco, start date 8/14, day 1/x Zosyn, start date 8/14, day 1/x   Continue ganiclovir IV, on valganciclovir at home Follow PCT, narrow abx as able  Vanc/zosyn for empiric coverage  ENDOCRINE A:   Mild Hyperglycemia - suspect stress response + steroids    P:   Monitor glucose on BMP, consider SSI if glucose consistently > 180  NEUROLOGIC A:   Acute  Metabolic Encephalopathy - in setting of autoimmune hepatitis + acute GI Bleed P:   RASS goal: 0 Minimize sedating medications  PRN fentanyl only Avoid benzos if possible  Consider lactulose, xifaxin once ok per GI and taking PO's   FAMILY  - Updates:  Mother updated on patients status.    - Inter-disciplinary family meet or Palliative Care meeting due by:  8/20  Noe Gens, NP-C [facility] Pgr: [phone] or if no answer [phone] 09/11/2014, 10:32 AM  Attending Note:  24 year old female with history of autoimmune hepatitis and primary sclerosing cholangitis with liver failure presenting for upper GI bleeding.  EGD done that showed varices, one was banded but since it is a gastric varices the band is not staying on appropriately.  I intubated the patient for airway protection for during the procedure.  IR and surgery have been notified.  IR will see now and surgery will see if emergent.  Abdomen is distended but para on 8/8 showed no SBP.  Will cover imperically for now.  Transfuse pRBC and platelets.  Serial H&Hs.  Once more stable needs to go to a transplant center.  The patient is critically ill with multiple organ systems failure and requires high complexity decision making for assessment and support, frequent evaluation and titration of therapies, application of advanced monitoring technologies and extensive interpretation of multiple databases.   Critical Care Time devoted to patient care services described in this note is  45  Minutes. This time reflects time of care of this signee Dr Jennet Maduro. This critical care time does not reflect procedure time, or teaching time or supervisory time of PA/NP/Med student/Med Resident etc but could involve care discussion time.  Rush Farmer, M.D. [facility] Pulmonary/Critical Care Medicine. Pager: [phone]. After hours pager: [phone].

## 2014-09-12 ENCOUNTER — Inpatient Hospital Stay (HOSPITAL_COMMUNITY): Payer: BC Managed Care – PPO

## 2014-09-12 ENCOUNTER — Encounter (HOSPITAL_COMMUNITY): Payer: Self-pay | Admitting: Gastroenterology

## 2014-09-12 DIAGNOSIS — K830 Cholangitis: Secondary | ICD-10-CM

## 2014-09-12 DIAGNOSIS — K922 Gastrointestinal hemorrhage, unspecified: Secondary | ICD-10-CM

## 2014-09-12 LAB — CBC
HCT: 24.9 % — ABNORMAL LOW (ref 36.0–46.0)
HCT: 23.1 % — ABNORMAL LOW (ref 36.0–46.0)
HEMATOCRIT: 23.3 % — AB (ref 36.0–46.0)
HEMATOCRIT: 23.2 % — AB (ref 36.0–46.0)
HEMOGLOBIN: 8.4 g/dL — AB (ref 12.0–15.0)
HEMOGLOBIN: 7.8 g/dL — AB (ref 12.0–15.0)
HEMOGLOBIN: 7.9 g/dL — AB (ref 12.0–15.0)
Hemoglobin: 7.8 g/dL — ABNORMAL LOW (ref 12.0–15.0)
MCH: 30.1 pg (ref 26.0–34.0)
MCH: 30.5 pg (ref 26.0–34.0)
MCH: 30.4 pg (ref 26.0–34.0)
MCH: 30.5 pg (ref 26.0–34.0)
MCHC: 33.7 g/dL (ref 30.0–36.0)
MCHC: 33.8 g/dL (ref 30.0–36.0)
MCHC: 33.9 g/dL (ref 30.0–36.0)
MCHC: 33.6 g/dL (ref 30.0–36.0)
MCV: 89.2 fL (ref 78.0–100.0)
MCV: 90.2 fL (ref 78.0–100.0)
MCV: 89.6 fL (ref 78.0–100.0)
MCV: 90.6 fL (ref 78.0–100.0)
PLATELETS: 62 10*3/uL — AB (ref 150–400)
PLATELETS: 49 10*3/uL — AB (ref 150–400)
PLATELETS: 46 10*3/uL — AB (ref 150–400)
Platelets: 48 10*3/uL — ABNORMAL LOW (ref 150–400)
RBC: 2.79 MIL/uL — ABNORMAL LOW (ref 3.87–5.11)
RBC: 2.56 MIL/uL — AB (ref 3.87–5.11)
RBC: 2.6 MIL/uL — ABNORMAL LOW (ref 3.87–5.11)
RBC: 2.56 MIL/uL — ABNORMAL LOW (ref 3.87–5.11)
RDW: 15.7 % — ABNORMAL HIGH (ref 11.5–15.5)
RDW: 17.8 % — ABNORMAL HIGH (ref 11.5–15.5)
RDW: 18 % — AB (ref 11.5–15.5)
RDW: 18.5 % — AB (ref 11.5–15.5)
WBC: 7.3 10*3/uL (ref 4.0–10.5)
WBC: 4.4 10*3/uL (ref 4.0–10.5)
WBC: 4.8 10*3/uL (ref 4.0–10.5)
WBC: 6.9 10*3/uL (ref 4.0–10.5)

## 2014-09-12 LAB — URINE CULTURE: CULTURE: NO GROWTH

## 2014-09-12 LAB — COMPREHENSIVE METABOLIC PANEL
ALBUMIN: 2 g/dL — AB (ref 3.5–5.0)
ALT: 30 U/L (ref 14–54)
AST: 43 U/L — AB (ref 15–41)
Alkaline Phosphatase: 208 U/L — ABNORMAL HIGH (ref 38–126)
Anion gap: 6 (ref 5–15)
BUN: 15 mg/dL (ref 6–20)
CHLORIDE: 114 mmol/L — AB (ref 101–111)
CO2: 22 mmol/L (ref 22–32)
CREATININE: 0.7 mg/dL (ref 0.44–1.00)
Calcium: 7.9 mg/dL — ABNORMAL LOW (ref 8.9–10.3)
GFR calc non Af Amer: >60 mL/min (ref >60)
GLUCOSE: 152 mg/dL — AB (ref 65–99)
Potassium: 4.4 mmol/L (ref 3.5–5.1)
SODIUM: 142 mmol/L (ref 135–145)
Total Bilirubin: 5.8 mg/dL — ABNORMAL HIGH (ref 0.3–1.2)
Total Protein: 4 g/dL — ABNORMAL LOW (ref 6.5–8.1)

## 2014-09-12 LAB — TYPE AND SCREEN
ABO/RH(D): B POS
Antibody Screen: NEGATIVE
UNIT DIVISION: 0
UNIT DIVISION: 0

## 2014-09-12 LAB — GLUCOSE, CAPILLARY
GLUCOSE-CAPILLARY: 154 mg/dL — AB (ref 65–99)
GLUCOSE-CAPILLARY: 119 mg/dL — AB (ref 65–99)
GLUCOSE-CAPILLARY: 135 mg/dL — AB (ref 65–99)
Glucose-Capillary: 135 mg/dL — ABNORMAL HIGH (ref 65–99)
Glucose-Capillary: 171 mg/dL — ABNORMAL HIGH (ref 65–99)
Glucose-Capillary: 86 mg/dL (ref 65–99)

## 2014-09-12 LAB — PREPARE FRESH FROZEN PLASMA
UNIT DIVISION: 0
Unit division: 0

## 2014-09-12 LAB — POCT I-STAT 3, ART BLOOD GAS (G3+)
Acid-base deficit: 3 mmol/L — ABNORMAL HIGH (ref 0.0–2.0)
Bicarbonate: 21.6 mEq/L (ref 20.0–24.0)
O2 SAT: 100 %
PCO2 ART: 34 mmHg — AB (ref 35.0–45.0)
PH ART: 7.411 (ref 7.350–7.450)
PO2 ART: 167 mmHg — AB (ref 80.0–100.0)
Patient temperature: 98.1
TCO2: 23 mmol/L (ref 0–100)

## 2014-09-12 LAB — PROTIME-INR
INR: 1.68 — AB (ref 0.00–1.49)
Prothrombin Time: 19.8 seconds — ABNORMAL HIGH (ref 11.6–15.2)

## 2014-09-12 LAB — PROCALCITONIN: PROCALCITONIN: 1.14 ng/mL

## 2014-09-12 LAB — MAGNESIUM: Magnesium: 1.5 mg/dL — ABNORMAL LOW (ref 1.7–2.4)

## 2014-09-12 LAB — PHOSPHORUS: Phosphorus: 2.7 mg/dL (ref 2.5–4.6)

## 2014-09-12 MED ORDER — CETYLPYRIDINIUM CHLORIDE 0.05 % MT LIQD
7.0000 mL | Freq: Two times a day (BID) | OROMUCOSAL | Status: DC
  Administered 2014-09-12 – 2014-09-13 (×2): 7 mL via OROMUCOSAL

## 2014-09-12 MED ORDER — FENTANYL CITRATE (PF) 100 MCG/2ML IJ SOLN
25.0000 ug | INTRAMUSCULAR | Status: DC | PRN
  Administered 2014-09-12: 100 ug via INTRAVENOUS
  Administered 2014-09-12: 25 ug via INTRAVENOUS
  Administered 2014-09-13 (×5): 100 ug via INTRAVENOUS
  Filled 2014-09-12 (×6): qty 2

## 2014-09-12 MED ORDER — LACTULOSE 10 GM/15ML PO SOLN
20.0000 g | Freq: Two times a day (BID) | ORAL | Status: DC
  Administered 2014-09-12 – 2014-09-13 (×3): 20 g via ORAL
  Filled 2014-09-12 (×4): qty 30

## 2014-09-12 MED ORDER — INSULIN ASPART 100 UNIT/ML ~~LOC~~ SOLN
1.0000 [IU] | SUBCUTANEOUS | Status: DC
  Administered 2014-09-12: 1 [IU] via SUBCUTANEOUS
  Administered 2014-09-12: 2 [IU] via SUBCUTANEOUS
  Administered 2014-09-13: 1 [IU] via SUBCUTANEOUS

## 2014-09-12 MED ORDER — FENTANYL CITRATE (PF) 100 MCG/2ML IJ SOLN
12.5000 ug | INTRAMUSCULAR | Status: DC | PRN
  Administered 2014-09-12 (×2): 12.5 ug via INTRAVENOUS
  Filled 2014-09-12 (×3): qty 2

## 2014-09-12 MED ORDER — RIFAXIMIN 550 MG PO TABS
550.0000 mg | ORAL_TABLET | Freq: Two times a day (BID) | ORAL | Status: DC
  Administered 2014-09-12 – 2014-09-13 (×3): 550 mg via ORAL
  Filled 2014-09-12 (×4): qty 1

## 2014-09-12 NOTE — Progress Notes (Signed)
200 mL fentanyl drip wasted in the sink with Carlton Adam, RN

## 2014-09-12 NOTE — Progress Notes (Signed)
Initial Nutrition Assessment  DOCUMENTATION CODES:   Not applicable  INTERVENTION:   Advance diet as medically appropriate, RD to add interventions accordingly  NUTRITION DIAGNOSIS:   Increased nutrient needs related to chronic illness as evidenced by estimated needs  GOAL:   Patient will meet greater than or equal to 90% of their needs  MONITOR:   Diet advancement, PO intake, Labs, Weight trends, I & O's  REASON FOR ASSESSMENT:   Malnutrition Screening Tool  ASSESSMENT:   24 y.o. Female with PMH of primary sclerosing cholangitis, auto-immune hepatitis and [facility]ices[facility] with reports of weakness, tremor, lightheadedness and nausea / vomiting / diarrhea for 3 days. In the ER, she had a witnessed large bloody bowel movement. Initial Hgb was 10.4 and dropped to 7.1. She was admitted per [facility] for further care. GI was consulted for evaluation. She developed hypotension and PCCM consulted for evaluation.   Pt extubated this AM.    Pt seen per Clinical Nutrition during most recent hospitalization last week.  Reported to RD she typically consumes 2 meals per day with protein shake in between meals.  Currently NPO s/p extubation.  Would benefit from addition of oral nutrition supplements once able.  RD unable to complete Nutrition Focused Physical Exam at this time.  Diet Order:  Diet NPO time specified  Skin:  Reviewed, no issues  Last BM:  8/15  Height:   Ht Readings from Last 1 Encounters:  09/11/14  (1.499 m)    Weight:   Wt Readings from Last 10 Encounters:  09/11/14 99 lb 3 oz (44.991 kg)  09/07/14 110 lb 9.6 oz (50.168 kg)  08/31/14 99 lb (44.906 kg)  08/07/14 100 lb (45.36 kg)  08/04/14 98 lb 8 oz (44.679 kg)  07/30/14 99 lb 6.8 oz (45.1 kg)  06/04/14 120 lb 12.8 oz (54.795 kg)  05/27/14 105 lb 12.8 oz (47.991 kg)  01/15/14 107 lb 12.9 oz (48.9 kg)  05/28/13 97 lb 4.8 oz (44.135 kg)    Ideal Body Weight:  44.5  kg  BMI:  Body mass index is 20.02 kg/(m^2).  Estimated Nutritional Needs:   Kcal:  1650-1850  Protein:  65-75 gm  Fluid:  1.6-1.8 L  EDUCATION NEEDS:   No education needs identified at this time  Maureen Chatters, RD, LDN Pager #: [phone] After-Hours Pager #: [phone]

## 2014-09-12 NOTE — Progress Notes (Signed)
PULMONARY / CRITICAL CARE MEDICINE   Name: Melanie Conley MRN: [id number] DOB: 1990-04-01    ADMISSION DATE:  09/11/2014 CONSULTATION DATE:  09/11/14  REFERRING MD :  Dr. Carles Collet   CHIEF COMPLAINT:  GIB   INITIAL PRESENTATION: 24 y/o F with PMH     HISTORY OF PRESENT ILLNESS:  24 y/o F with PMH of primary sclerosing cholangitis, auto-immune hepatitis, known esophageal varices, irregular periods and recent admit 09/04/14 - 09/07/14 for diffuse abdominal pain and worsening ascites.  At that time was found to have diffuse colitis on CT, admitted for IV ABX.and anemia with Hgb drop from 10.2 to 6.5, and confusion.  Paracentesis was performed which was negative for SBP.  She was discharged on cipro & flagyl for 4 more days (to be completed on 8/14). She presented to [facility] on 8/14 with reports of weakness, tremor, lightheadedness and nausea / vomiting / diarrhea for 3 days.  In the ER, she had a witnessed large bloody bowel movement.  Initial Hgb was 10.4 and dropped to 7.1.  She was admitted per [facility] for further care.  GI was consulted for evaluation.  She developed hypotension and PCCM consulted for evaluation.    Labs notable for Hgb of 7.1, platelets 137, Na 132, K 2.8, sr cr 0.61, glucose 115, alk phos 448, AST 114 / ALT 60, ammonia 164 and lactic acid 3.24.  CXR assessed and clear.  CT head assessed for AMS and was negative.  The patient underwent EGD the am of 8/14 and was found to have variceal bleeding.  She was emergently intubated for airway protection.  Esophageal varices were banded per GI.     STUDIES:  8/14  CT Head >> negative  8/14 EGD w/ band ligation of Bleeding gastric varix, Esophageal varices (no bleeding stigmata)  SIGNIFICANT EVENTS: 8/14  Admit with weakness, tremor, lightheadedness and nausea / vomiting / diarrhea for 3 days.   SUBJECTIVE: afebrile Poor UO Awake & interactive  VITAL SIGNS: Temp:  [97.6 F (36.4 C)-98.7 F (37.1 C)] 97.8 F (36.6 C) (08/15 0800) Pulse  Rate:  [63-137] 69 (08/15 0800) Resp:  [9-160] 18 (08/15 0800) BP: (87-161)/(44-129) 127/77 mmHg (08/15 0800) SpO2:  [98 %-100 %] 100 % (08/15 0800) FiO2 (%):  [40 %-100 %] 40 % (08/15 0800)   HEMODYNAMICS:     VENTILATOR SETTINGS: Vent Mode:  [-] PRVC FiO2 (%):  [40 %-100 %] 40 % Set Rate:  [16 bmp-20 bmp] 16 bmp Vt Set:  [400 mL] 400 mL PEEP:  [5 cmH20] 5 cmH20 Plateau Pressure:  [18 cmH20-24 cmH20] 18 cmH20   INTAKE / OUTPUT:  Intake/Output Summary (Last 24 hours) at 09/12/14 1950 Last data filed at 09/12/14 0900  Gross per 24 hour  Intake 4531.75 ml  Output    680 ml  Net 3851.75 ml    PHYSICAL EXAMINATION: General:  Chronically ill young adult female Neuro:  Awake, RASS+1, MAE HEENT:  MM pale, dry, jvd present Cardiovascular:  s1s2 rrr, tachy Lungs:  resp's even/non-labored, lungs bilaterally clear  Abdomen:  Distended, tender to palpation, BSx4 active  Musculoskeletal:  No acute deformities  Skin:  Warm/dry, no edema   LABS:  CBC  Recent Labs Lab 09/11/14 1549 09/11/14 2203 09/12/14 0419  WBC 7.3 5.8 4.4  HGB 8.4* 8.6* 7.8*  HCT 24.9* 25.4* 23.1*  PLT 62* 57* 49*   Coag's  Recent Labs Lab 09/11/14 0632 09/12/14 0419  INR 2.64* 1.68*   BMET  Recent Labs Lab  09/07/14 0547 09/11/14 0142 09/12/14 0419  NA 131* 132* 142  K 3.9 2.8* 4.4  CL 101 96* 114*  CO2 _0 BUN _1 CREATININE 0.61 0.61 0.70  GLUCOSE 130* 115* 152*   Electrolytes  Recent Labs Lab 09/07/14 0547 09/11/14 0142 09/12/14 0419  CALCIUM 8.2* 7.8* 7.9*  MG  --  1.8 1.5*  PHOS  --   --  2.7   Sepsis Markers  Recent Labs Lab 09/11/14 0606 09/11/14 0618 09/11/14 0827 09/12/14 0419  LATICACIDVEN 3.24* 3.4* 3.2*  --   PROCALCITON  --   --   --  1.14   ABG  Recent Labs Lab 09/11/14 1320 09/12/14 0331  PHART 7.376 7.411  PCO2ART 40.0 34.0*  PO2ART 490* 167.0*     Liver Enzymes  Recent Labs Lab 09/07/14 0547 09/11/14 0142 09/12/14 0419   AST 29 114* 43*  ALT 26 60* 30  ALKPHOS 330* 448* 208*  BILITOT 3.8* 7.5* 5.8*  ALBUMIN 2.2* 2.6* 2.0*   Cardiac Enzymes No results for input(s): TROPONINI, PROBNP in the last 168 hours.   Glucose  Recent Labs Lab 09/11/14 0420 09/11/14 2110 09/12/14 0010 09/12/14 0409 09/12/14 0726  GLUCAP 166* 128* 135* 154* 119*    Imaging Dg Chest Port 1 View  09/12/2014   CLINICAL DATA:  History of bleeding gastric varix. Status post band ligation 09/11/2014. Intubated patient.  EXAM: PORTABLE CHEST - 1 VIEW  COMPARISON:  Single view of the chest 09/11/2014.  FINDINGS: Endotracheal tube remains in place with the tip in good position at the level of the clavicular heads. Right IJ catheter tip projects in the right atrium. The line should be withdrawn approximately 2.5 cm for better positioning. Lung volumes are low but the lungs are clear. No pneumothorax or pleural effusion. Heart size is normal. Gaseous distention of the stomach seen on yesterday's study has resolved.  IMPRESSION: Endotracheal tube in good position.  Right IJ catheter tip projects in the right atrium. Recommend withdrawal of 2.5 cm.  Left lower lobe airspace disease seen on yesterday's examination has cleared.   Electronically Signed   By: Inge Rise M.D.   On: 09/12/2014 07:30   Dg Chest Port 1 View  09/11/2014   CLINICAL DATA:  24 year old female with sepsis. Intubated and central line placement. Initial encounter.  EXAM: PORTABLE CHEST - 1 VIEW  COMPARISON:  0955 hours today, and earlier  FINDINGS: Portable AP semi upright view at 1219 hours. Endotracheal tube tip is at the carina and directed slightly toward the right mainstem bronchus. Right IJ central line is been placed, tip projects at the lower SVC level.  No pneumothorax. Left lower lobe collapse or consolidation is new. No pulmonary edema or pleural effusion. Moderate to severe gaseous distension of the stomach is new. Normal cardiac size and mediastinal contours.   IMPRESSION: 1. Intubated, endotracheal tube tip at the carina or just inside the right mainstem bronchus. Retract 3 cm for placement at the level of the clavicles. 2. Left lower lobe collapse/consolidation. Moderate to severe gaseous distension of the stomach. 3. Right IJ central line placed, tip at the lower SVC level. No pneumothorax. Study discussed by telephone with 2 Heart RN Larey Seat on 09/11/2014 at 1316 hrs.   Electronically Signed   By: Genevie Ann M.D.   On: 09/11/2014 13:20   Dg Chest Port 1 View  09/11/2014   CLINICAL DATA:  24 year old female with sepsis. Initial encounter.  EXAM: PORTABLE  CHEST - 1 VIEW  COMPARISON:  0631 hours today, and earlier  FINDINGS: Portable AP semi upright view at 0955 hours. Continued low lung volumes. Allowing for portable technique, the lungs are clear. No pneumothorax or pneumoperitoneum. Normal cardiac size and mediastinal contours.  IMPRESSION: No acute cardiopulmonary abnormality.   Electronically Signed   By: Genevie Ann M.D.   On: 09/11/2014 10:33     ASSESSMENT / PLAN:  PULMONARY OETT 8/14 >>  A: Airway Protection / Respiratory Failure - in setting of GIB, sedation for EGD P:   SBts with goal extubation  CARDIOVASCULAR CVL R IJ 8/14 >> A:  Hypotension - baseline BP low 100's Baseline Prednisone 42m Dependent  P:  Volume resuscitated with PRBC's  Stress dose steroids, adjust dosing (chronic baseline prednisone) Vasopressors if MAP < 60 Hold coreg, aldactone, lasix for now   RENAL A:   Hypokalemia  Hyponatremia  Lactic Acidosis  P:    Replace electrolytes as indicated  NS @ 75 ml/hr  GASTROINTESTINAL A:   Autoimmune Hepatitis / Primary Sclerosing Colangitis Abdominal Pain / Diffuse Colitis - s/p paracentesis 8/8, no SBP Elevated Ammonia  Gastric varices P:   GI Following  Continue protonix / octreotide gtt's  IR consulted for BRTO if rebleed  HEMATOLOGIC A:   Coagulopathy -not on anticoagulation, INR rising to 2.64 from 1.69  on 8/8 P:  Monitor INR / PT trend  Vitamin K  given DVT:  SCD's  Follow up H/H post transfusion   INFECTIOUS A:   Hx CMV Viremia  P:   BCx2 8/14 >> UC 8/14 >>   Vanco, start date 8/14>> Zosyn, start date 8/14 >>  Continue ganiclovir IV, on valganciclovir at home Follow PCT, narrow abx as able  Vanc/zosyn for empiric coverage  ENDOCRINE A:   Mild Hyperglycemia - suspect stress response + steroids    P:   Monitor glucose on BMP, consider SSI if glucose consistently > 180  NEUROLOGIC A:   Acute Metabolic Encephalopathy - in setting of autoimmune hepatitis + acute GI Bleed, resolving P:   RASS goal: 0 Minimize sedating medications  PRN fentanyl only Avoid benzos if possible  Consider lactulose, xifaxin  If mental status worsens  FAMILY  - Updates:  Mother updated on patients status.    - Inter-disciplinary family meet or Palliative Care meeting due by:  8/20   Summary - d/w IR, proceed with extubation  Plan for BRTO if rebleed  The patient is critically ill with multiple organ systems failure and requires high complexity decision making for assessment and support, frequent evaluation and titration of therapies, application of advanced monitoring technologies and extensive interpretation of multiple databases. Critical Care Time devoted to patient care services described in this note independent of APP time is 35 minutes.   RKara MeadMD. FShade Flood [facility] Pager [phone] no response call [phone]    09/12/2014, 9:28 AM

## 2014-09-12 NOTE — Progress Notes (Signed)
eLink Physician-Brief Progress Note Patient Name: Melanie Conley DOB: Nov 12, 1990 MRN: [id number]   Date of Service  09/12/2014  HPI/Events of Note  Oliguria.  eICU Interventions  Monitor CVP.     Intervention Category Intermediate Interventions: Oliguria - evaluation and management  Deyton Ellenbecker Eugene 09/12/2014, 6:48 PM

## 2014-09-12 NOTE — Progress Notes (Signed)
Patient ID: Melanie Conley, female   DOB: 04/12/1990, 24 y.o.   MRN: [id number] [facility] Gastroenterology Progress Note  Melanie Conley 23 y.o. 18-Dec-1990   Subjective: Awake. Plan for extubation now by pulmonary.  Objective: Vital signs in last 24 hours: Filed Vitals:   09/12/14 0800  BP: 127/77  Pulse: 69  Temp: 97.8 F (36.6 C)  Resp: 18    Physical Exam: Gen: awake on ventilator, no acute distress HEENT: +scleral icterus CV: RRR Chest: CTA B Abd: diffusely tender with guarding, soft, nondistended, +BS Ext: pulses intact, no edema  Lab Results:  Recent Labs  09/11/14 0142 09/12/14 0419  NA 132* 142  K 2.8* 4.4  CL 96* 114*  CO2 27 22  GLUCOSE 115* 152*  BUN 10 15  CREATININE 0.61 0.70  CALCIUM 7.8* 7.9*  MG 1.8 1.5*  PHOS  --  2.7    Recent Labs  09/11/14 0142 09/12/14 0419  AST 114* 43*  ALT 60* 30  ALKPHOS 448* 208*  BILITOT 7.5* 5.8*  PROT 5.7* 4.0*  ALBUMIN 2.6* 2.0*    Recent Labs  09/11/14 0142  09/12/14 0419 09/12/14 0926  WBC 12.6*  < > 4.4 4.8  NEUTROABS 8.2*  --   --   --   HGB 10.4*  < > 7.8* 7.9*  HCT 31.6*  < > 23.1* 23.3*  MCV 91.9  < > 90.2 89.6  PLT 137*  < > 49* 48*  < > = values in this interval not displayed.  Recent Labs  09/11/14 0632 09/12/14 0419  LABPROT 27.8* 19.8*  INR 2.64* 1.68*      Assessment/Plan: Decompensated cirrhosis with gastric variceal bleed - s/p banding yesterday. Hepatic encephalopathy - resolving. Add Rifaximin and Lactulose. Recommend inpt transfer to [facility] hepatology in the near future unless bleeding recurs and then needs BRTO by interventional radiology vs surgery.   Melanie Conley C. 09/12/2014, 10:02 AM  Pager [phone]  If no answer or after 5 PM call [phone]

## 2014-09-12 NOTE — Procedures (Signed)
Extubation Procedure Note  Patient Details:   Name: KAZIYAH PARKISON DOB: 08/13/1990 MRN: [id number]   Airway Documentation:     Evaluation  O2 sats: stable throughout Complications: No apparent complications Patient did tolerate procedure well. Bilateral Breath Sounds: Clear, Diminished Suctioning: Airway Yes   Pt extubated at this time per MD order.  Placed on 4L  tolerating well, no distress noted at this time.   Cherylin Mylar 09/12/2014, 10:06 AM

## 2014-09-12 NOTE — Progress Notes (Signed)
eLink Physician-Brief Progress Note Patient Name: Melanie Conley DOB: December 03, 1990 MRN: [id number]   Date of Service  09/12/2014  HPI/Events of Note  RN notified Platelet count 49 & Hgb decreased. Patient hemodynamically stable.  eICU Interventions  Suspect reequilibration since all cell lines decreased. Continue to monitor CBC q6hr. RN to notify for any hemodynamic instability.     Intervention Category Intermediate Interventions: Bleeding - evaluation and treatment with blood products  Lawanda Cousins 09/12/2014, 4:44 AM

## 2014-09-12 NOTE — Progress Notes (Signed)
CVC ports checked for blood return. Blood return noted on all three ports.

## 2014-09-12 NOTE — Progress Notes (Signed)
Pt placed on PSV 5/5 by Dr. Vassie Loll, MD would like to extubate pt now

## 2014-09-13 ENCOUNTER — Inpatient Hospital Stay (HOSPITAL_COMMUNITY): Payer: BC Managed Care – PPO

## 2014-09-13 LAB — CBC
HCT: 25.7 % — ABNORMAL LOW (ref 36.0–46.0)
HCT: 24.9 % — ABNORMAL LOW (ref 36.0–46.0)
Hemoglobin: 8.6 g/dL — ABNORMAL LOW (ref 12.0–15.0)
Hemoglobin: 8.1 g/dL — ABNORMAL LOW (ref 12.0–15.0)
MCH: 30.6 pg (ref 26.0–34.0)
MCH: 30.3 pg (ref 26.0–34.0)
MCHC: 33.5 g/dL (ref 30.0–36.0)
MCHC: 32.5 g/dL (ref 30.0–36.0)
MCV: 91.5 fL (ref 78.0–100.0)
MCV: 93.3 fL (ref 78.0–100.0)
PLATELETS: 62 10*3/uL — AB (ref 150–400)
PLATELETS: 53 10*3/uL — AB (ref 150–400)
RBC: 2.81 MIL/uL — ABNORMAL LOW (ref 3.87–5.11)
RBC: 2.67 MIL/uL — AB (ref 3.87–5.11)
RDW: 18.5 % — AB (ref 11.5–15.5)
RDW: 18.7 % — AB (ref 11.5–15.5)
WBC: 9 10*3/uL (ref 4.0–10.5)
WBC: 6.8 10*3/uL (ref 4.0–10.5)

## 2014-09-13 LAB — COMPREHENSIVE METABOLIC PANEL
ALBUMIN: 2 g/dL — AB (ref 3.5–5.0)
ALT: 26 U/L (ref 14–54)
AST: 29 U/L (ref 15–41)
Alkaline Phosphatase: 176 U/L — ABNORMAL HIGH (ref 38–126)
Anion gap: 3 — ABNORMAL LOW (ref 5–15)
BUN: 14 mg/dL (ref 6–20)
CHLORIDE: 111 mmol/L (ref 101–111)
CO2: 22 mmol/L (ref 22–32)
Calcium: 7.9 mg/dL — ABNORMAL LOW (ref 8.9–10.3)
Creatinine, Ser: 0.69 mg/dL (ref 0.44–1.00)
GFR calc Af Amer: >60 mL/min (ref >60)
GFR calc non Af Amer: >60 mL/min (ref >60)
GLUCOSE: 140 mg/dL — AB (ref 65–99)
POTASSIUM: 4.7 mmol/L (ref 3.5–5.1)
SODIUM: 136 mmol/L (ref 135–145)
Total Bilirubin: 4 mg/dL — ABNORMAL HIGH (ref 0.3–1.2)
Total Protein: 4.2 g/dL — ABNORMAL LOW (ref 6.5–8.1)

## 2014-09-13 LAB — PROTIME-INR
INR: 1.75 — ABNORMAL HIGH (ref 0.00–1.49)
Prothrombin Time: 20.4 seconds — ABNORMAL HIGH (ref 11.6–15.2)

## 2014-09-13 LAB — GLUCOSE, CAPILLARY
GLUCOSE-CAPILLARY: 120 mg/dL — AB (ref 65–99)
GLUCOSE-CAPILLARY: 122 mg/dL — AB (ref 65–99)
GLUCOSE-CAPILLARY: 128 mg/dL — AB (ref 65–99)
GLUCOSE-CAPILLARY: 88 mg/dL (ref 65–99)
Glucose-Capillary: 129 mg/dL — ABNORMAL HIGH (ref 65–99)

## 2014-09-13 LAB — PROCALCITONIN: PROCALCITONIN: 0.7 ng/mL

## 2014-09-13 MED ORDER — VALGANCICLOVIR HCL 450 MG PO TABS
900.0000 mg | ORAL_TABLET | Freq: Every day | ORAL | Status: DC
  Administered 2014-09-13: 900 mg via ORAL
  Filled 2014-09-13: qty 2

## 2014-09-13 MED ORDER — SODIUM CHLORIDE 0.9 % IV SOLN: 8.0000 mg/h | INTRAVENOUS | Status: DC

## 2014-09-13 MED ORDER — FUROSEMIDE 20 MG PO TABS
20.0000 mg | ORAL_TABLET | Freq: Every day | ORAL | Status: DC
  Administered 2014-09-13: 20 mg via ORAL
  Filled 2014-09-13: qty 1

## 2014-09-13 MED ORDER — SODIUM CHLORIDE 0.9 % IV SOLN: 10.0000 mL | INTRAVENOUS | Status: DC

## 2014-09-13 MED ORDER — SODIUM CHLORIDE 0.9 % IV SOLN: 50.0000 ug/h | INTRAVENOUS | Status: DC

## 2014-09-13 MED ORDER — ONDANSETRON HCL 4 MG PO TABS: 4.0000 mg | ORAL_TABLET | Freq: Four times a day (QID) | ORAL | Status: DC | PRN

## 2014-09-13 MED ORDER — PANTOPRAZOLE SODIUM 40 MG PO TBEC: 40.0000 mg | DELAYED_RELEASE_TABLET | Freq: Two times a day (BID) | ORAL | Status: DC

## 2014-09-13 MED ORDER — FUROSEMIDE 20 MG PO TABS
20.0000 mg | ORAL_TABLET | Freq: Every day | ORAL | Status: DC
Start: 2014-09-13 — End: 2014-10-12

## 2014-09-13 MED ORDER — METRONIDAZOLE 500 MG PO TABS
500.0000 mg | ORAL_TABLET | Freq: Three times a day (TID) | ORAL | Status: DC
  Administered 2014-09-13 (×2): 500 mg via ORAL
  Filled 2014-09-13 (×2): qty 1

## 2014-09-13 MED ORDER — RIFAXIMIN 550 MG PO TABS: 550.0000 mg | ORAL_TABLET | Freq: Two times a day (BID) | ORAL | Status: DC

## 2014-09-13 MED ORDER — LACTULOSE 10 GM/15ML PO SOLN: 20.0000 g | Freq: Two times a day (BID) | ORAL | Status: AC

## 2014-09-13 MED ORDER — HYDROCORTISONE NA SUCCINATE PF 100 MG IJ SOLR: 50.0000 mg | Freq: Four times a day (QID) | INTRAMUSCULAR | Status: DC

## 2014-09-13 MED ORDER — INSULIN ASPART 100 UNIT/ML ~~LOC~~ SOLN: 1.0000 [IU] | SUBCUTANEOUS | Status: DC

## 2014-09-13 MED ORDER — OCTREOTIDE LOAD VIA INFUSION: 100.0000 ug | Freq: Once | INTRAVENOUS | Status: DC

## 2014-09-13 NOTE — Discharge Summary (Signed)
Physician Discharge Summary  Patient ID: MARIDEL PIXLER MRN: [id number] DOB/AGE: February 15, 1990 24 y.o.  Admit date: 09/11/2014 Discharge date: 09/13/2014  Problem List Principal Problem:   Severe sepsis with acute organ dysfunction Active Problems:   GI bleeding   Hypokalemia   Primary sclerosing cholangitis   Hepatic encephalopathy   Acute blood loss anemia   Acute respiratory failure with hypercapnia   Coagulopathy   Other cirrhosis of liver   Gastric varix  HPI:  Eleny Cortez is a 24 y.o. female, with autoimmune hepatitis, and primary sclerosing cholangitis, and known esophageal varices who presents to the emergency department in the early a.m. of 8/14 with weakness and abdominal pain. She had a recent admission from 8/7-8/10 for colitis with hepatic encephalopathy. A CT scan was completed on 8/8 that showed new mucosal edema involving the entirety of the colon - inflammatory colitis could not be excluded. At that time she also had large-volume ascites. She was treated and discharged on Cipro/ Flagyl as well as lactulose twice a day. This morning in the ER she is encephalopathic and unable to give a detailed history. She does complain of umbilical pain. Per the RN the patient produced a large amount of bright red blood per rectum (enough to overflow a bed pan) at approximately 8 AM this morning.  In the ER initial temperature was 95.9, pulse rate ranged from 101-138, white count 12.6 indicating severe sepsis. Her hemoglobin was 10.4, potassium 2.8, INR 2.64, lactic acid 3.4, Ammonia 164. Blood pressure was initially soft but improved with 2 L of IV fluids. Temperature normalized with the use of a bair hugger.   [facility] gastroenterology has been consulted. We have spoken with Dr. Charlott Rakes who will see the patient this morning and move forward with colonoscopy. He advised to start the patient on octreotide as precautionary measure. Critical care has been consulted due to  severe sepsis and likelihood of decompensation. We have also spoken with Dr. Ruffin Frederick, [facility] hepatologist on call, who requests that the patient be transferred to [facility] once she is stable. He is reachable on the [facility] transfer line [phone].  Hospital Course:   STUDIES:  8/14 CT Head >> negative  8/14 EGD w/ band ligation of Bleeding gastric varix, Esophageal varices (no bleeding stigmata)  SIGNIFICANT EVENTS: 8/14 Admit with weakness, tremor, lightheadedness and nausea / vomiting / diarrhea for 3 days. 8/15 extubated 8/16 Transfer to [facility]  ASSESSMENT / PLAN:  PULMONARY OETT 8/14 >>8/15  A: Airway Protection / Respiratory Failure - in setting of GIB, sedation for EGD(resolved) P:  -can mobilise  CARDIOVASCULAR CVL R IJ 8/14 >> A:  Hypovolemic shock - baseline BP low 100's Baseline Prednisone 5mg  Dependent  P:  Volume resuscitated with PRBC's  Stress dose steroids, adjust dosing (chronic baseline prednisone)resume immuran Hold coreg, aldactone for now   RENAL Lab Results  Component Value Date   CREATININE 0.69 09/13/2014   CREATININE 0.70 09/12/2014   CREATININE 0.61 09/11/2014    Recent Labs Lab 09/11/14 0142 09/12/14 0419 09/13/14 0430  K 2.8* 4.4 4.7     Recent Labs Lab 09/11/14 0142 09/12/14 0419 09/13/14 0430  NA 132* 142 136    A:  Hypokalemia  Hyponatremia  Lactic Acidosis  P:  Replace electrolytes as indicated  Dc IVfs Resume lasix   GASTROINTESTINAL A:  Autoimmune Hepatitis / Primary Sclerosing Colangitis Abdominal Pain / Diffuse Colitis - s/p paracentesis 8/8, no SBP Elevated Ammonia  Gastric varices P:  GI Following  Continue protonix / octreotide gtt's  IR consulted for BRTO if rebleed Transfer to [facility] 8/16  HEMATOLOGIC A:  Coagulopathy -not on anticoagulation P:  Monitor INR / PT trend  Vitamin K Given, rpt if INR 2 & above DVT: SCD's   INFECTIOUS A:  Hx CMV Viremia   c diff colitis P:  BCx2 8/14 >>NGTD UC 8/14 >> neg 8/8 c diff POS  Vanco, start date 8/14>> 8/16 Zosyn, start date 8/14 >> 8/16 Flagyl 8/15>>  Xifaxan 8/15>>  Resume valganciclovir PO Dc Vanc/zosyn , pct low Resume PO flagyl for c diff  ENDOCRINE CBG (last 3)   Recent Labs  09/13/14 0007 09/13/14 0429 09/13/14 0730  GLUCAP 120* 129* 128*     A:  Mild Hyperglycemia - suspect stress response + steroids  P:  Monitor glucose on BMP, consider SSI if glucose consistently > 180  NEUROLOGIC A:  Acute Metabolic Encephalopathy - in setting of autoimmune hepatitis + acute GI Bleed, resolving P:  RASS goal: 0 PRN fentanyl only resume lactulose, xifaxin    Labs at discharge Lab Results  Component Value Date   CREATININE 0.69 09/13/2014   BUN 14 09/13/2014   NA 136 09/13/2014   K 4.7 09/13/2014   CL 111 09/13/2014   CO2 22 09/13/2014   Lab Results  Component Value Date   WBC 9.0 09/13/2014   HGB 8.6* 09/13/2014   HCT 25.7* 09/13/2014   MCV 91.5 09/13/2014   PLT 62* 09/13/2014   Lab Results  Component Value Date   ALT 26 09/13/2014   AST 29 09/13/2014   ALKPHOS 176* 09/13/2014   BILITOT 4.0* 09/13/2014   Lab Results  Component Value Date   INR 1.75* 09/13/2014   INR 1.68* 09/12/2014   INR 2.64* 09/11/2014    Current radiology studies Dg Chest Port 1 View  09/12/2014   CLINICAL DATA:  Central line placement.  EXAM: PORTABLE CHEST - 1 VIEW  COMPARISON:  09/12/2014 at 5:07 a.m.  FINDINGS: Interval removal of endotracheal tube. Right IJ central venous catheter has been pulled back and tip over the SVC below the level of the head of the right clavicle and 2.5 cm above the carina.  Lungs are hypoinflated and otherwise clear. Cardiomediastinal silhouette and remainder of the exam is unchanged.  IMPRESSION: Hypoinflation without acute cardiopulmonary disease.  Right IJ central venous catheter has been pulled back with tip over the SVC just below  the head of the right clavicle.  These results were called by telephone at the time of interpretation on 09/12/2014 at 9:09 pm to patient's nurse, May Aninon, who verbally acknowledged these results.   Electronically Signed   By: Elberta Fortis M.D.   On: 09/12/2014 21:10   Dg Chest Port 1 View  09/12/2014   CLINICAL DATA:  History of bleeding gastric varix. Status post band ligation 09/11/2014. Intubated patient.  EXAM: PORTABLE CHEST - 1 VIEW  COMPARISON:  Single view of the chest 09/11/2014.  FINDINGS: Endotracheal tube remains in place with the tip in good position at the level of the clavicular heads. Right IJ catheter tip projects in the right atrium. The line should be withdrawn approximately 2.5 cm for better positioning. Lung volumes are low but the lungs are clear. No pneumothorax or pleural effusion. Heart size is normal. Gaseous distention of the stomach seen on yesterday's study has resolved.  IMPRESSION: Endotracheal tube in good position.  Right IJ catheter tip projects in the right atrium. Recommend withdrawal of 2.5 cm.  Left lower lobe airspace disease seen on yesterday's examination has cleared.   Electronically Signed   By: Drusilla Kanner M.D.   On: 09/12/2014 07:30   Dg Chest Port 1 View  09/11/2014   CLINICAL DATA:  24 year old female with sepsis. Intubated and central line placement. Initial encounter.  EXAM: PORTABLE CHEST - 1 VIEW  COMPARISON:  0955 hours today, and earlier  FINDINGS: Portable AP semi upright view at 1219 hours. Endotracheal tube tip is at the carina and directed slightly toward the right mainstem bronchus. Right IJ central line is been placed, tip projects at the lower SVC level.  No pneumothorax. Left lower lobe collapse or consolidation is new. No pulmonary edema or pleural effusion. Moderate to severe gaseous distension of the stomach is new. Normal cardiac size and mediastinal contours.  IMPRESSION: 1. Intubated, endotracheal tube tip at the carina or just inside  the right mainstem bronchus. Retract 3 cm for placement at the level of the clavicles. 2. Left lower lobe collapse/consolidation. Moderate to severe gaseous distension of the stomach. 3. Right IJ central line placed, tip at the lower SVC level. No pneumothorax. Study discussed by telephone with 2 Heart RN Delphia Grates on 09/11/2014 at 1316 hrs.   Electronically Signed   By: Odessa Fleming M.D.   On: 09/11/2014 13:20    Disposition: [facility]     Medication List    ASK your doctor about these medications        albuterol (2.5 MG/3ML) 0.083% nebulizer solution  Commonly known as:  PROVENTIL  Take 2.5 mg by nebulization every 4 (four) hours as needed for wheezing or shortness of breath.     carvedilol 3.125 MG tablet  Commonly known as:  COREG  Take 3.125 mg by mouth 2 (two) times daily.     ciprofloxacin 500 MG tablet  Commonly known as:  CIPRO  Take 1 tablet (500 mg total) by mouth 2 (two) times daily.     furosemide 40 MG tablet  Commonly known as:  LASIX  Take 1 tablet (40 mg total) by mouth daily.     IMURAN 50 MG tablet  Generic drug:  azaTHIOprine  Take 50 mg by mouth daily.     lactose free nutrition Liqd  Take 237 mLs by mouth daily.     feeding supplement (ENSURE ENLIVE) Liqd  Take 237 mLs by mouth 2 (two) times daily between meals.     lactulose 10 GM/15ML solution  Commonly known as:  CHRONULAC  Take 30 mLs (20 g total) by mouth 3 (three) times daily.     lidocaine 2 % solution  Commonly known as:  XYLOCAINE  Use as directed 200 mg in the mouth or throat 3 (three) times daily as needed. For GI / esophageal upset     metroNIDAZOLE 500 MG tablet  Commonly known as:  FLAGYL  Take 1 tablet (500 mg total) by mouth 3 (three) times daily.     omeprazole 40 MG capsule  Commonly known as:  PRILOSEC  Take 40 mg by mouth daily.     potassium chloride 10 MEQ tablet  Commonly known as:  K-DUR  Take 1 tablet (10 mEq total) by mouth once.     predniSONE 5 MG  tablet  Commonly known as:  DELTASONE  Take 5 mg by mouth daily.     Protein Powd  Take 1 scoop by mouth 2 (two) times daily.     REGLAN 5 MG tablet  Generic  drug:  metoCLOPramide  Take 5 mg by mouth 2 (two) times daily. 30 minutes before a meal     spironolactone 50 MG tablet  Commonly known as:  ALDACTONE  Take 100 mg by mouth daily.     ursodiol 300 MG capsule  Commonly known as:  ACTIGALL  Take 300 mg by mouth 2 (two) times daily.     valGANciclovir 450 MG tablet  Commonly known as:  VALCYTE  Take 900 mg by mouth daily.          Discharged Condition: fair  Time spent on discharge greater than 40 minutes.  Vital signs at Discharge. Temp:  [98 F (36.7 C)-98.7 F (37.1 C)] 98.7 F (37.1 C) (08/16 0800) Pulse Rate:  [59-97] 67 (08/16 0100) Resp:  [10-30] 12 (08/16 0900) BP: (92-142)/(46-99) 112/60 mmHg (08/16 0900) SpO2:  [95 %-100 %] 98 % (08/16 0600) Office follow up Special Information or instructions. She is being transferred to [facility] for transplant evaluation. Signed: Brett Canales Jacqulyne Gladue ACNP Adolph Pollack PCCM Pager [phone] till 3 pm If no answer page [phone] 09/13/2014, 10:43 AM

## 2014-09-13 NOTE — Progress Notes (Addendum)
Patient ID: Melanie Conley, female   DOB: 08/07/1990, 24 y.o.   MRN: [id number] [facility] Progress Note  PAVNEET MARKWOOD 24 y.o. 1991/01/02   Subjective: Complaining of nausea and abdominal pain. More alert today. Denies vomiting.  Objective: Vital signs in last 24 hours: Filed Vitals:   09/13/14 0800  BP: 96/56  Pulse: 67  Temp: 98.7 F (37.1 C)  Resp: 11    Physical Exam: Gen: alert, no acute distress, thin HEENT: +scleral icterus CV: RRR Chest: CTA B Abd: diffuse tenderness with guarding, soft, nondistended, +BS Ext: no edema  Lab Results:  Recent Labs  09/11/14 0142 09/12/14 0419 09/13/14 0430  NA 132* 142 136  K 2.8* 4.4 4.7  CL 96* 114* 111  CO2 GLUCOSE 115* 152* 140*  BUN CREATININE 0.61 0.70 0.69  CALCIUM 7.8* 7.9* 7.9*  MG 1.8 1.5*  --   PHOS  --  2.7  --     Recent Labs  09/12/14 0419 09/13/14 0430  AST 43* 29  ALT 30 26  ALKPHOS 208* 176*  BILITOT 5.8* 4.0*  PROT 4.0* 4.2*  ALBUMIN 2.0* 2.0*    Recent Labs  09/11/14 0142  09/12/14 1810 09/13/14 0207  WBC 12.6*  < > 6.9 9.0  NEUTROABS 8.2*  --   --   --   HGB 10.4*  < > 7.8* 8.6*  HCT 31.6*  < > 23.2* 25.7*  MCV 91.9  < > 90.6 91.5  PLT 137*  < > 46* 62*  < > = values in this interval not displayed.  Recent Labs  09/12/14 0419 09/13/14 0430  LABPROT 19.8* 20.4*  INR 1.68* 1.75*      Assessment/Plan: Decompensated Cirrhosis and s/p gastric variceal bleed with banding. Resolving encephalopathy on Lactulose and Rifaximin. Dark red and black loose stools noted yesterday and overnight. +C. diff last week and PO Flagyl started by CCM. Needs transfer to [facility] hepatology for further management due to high risk of rebleeding. Discussed with Dr. Piedad Climes at [facility] who will arrange transfer to their ICU for further management. Dr. Vassie Loll aware and hopefully she will have a bed today. Clear liquid diet but do not advance beyond that. Continue Octreotide  drip.   Kaoru Benda C. 09/13/2014, 9:02 AM  Pager [phone]  If no answer or after 5 PM call [phone]

## 2014-09-13 NOTE — Progress Notes (Signed)
Spoke to Dr Colon Branch, [facility] -accepting ICU MD  Reason - transplant eval & possible BRTO if rebleed [facility] to transport  Oretha Milch. MD

## 2014-09-13 NOTE — Clinical Documentation Improvement (Signed)
Please clarify if the diagnosis of Sepsis can be further specified and document findings in next progress note and include in discharge summary if applicable:  Septic Shock Hypovolemic Shock Hemorrhagic Shock Other Condition Cannot Clinically determine   Patient hypotensive with BP's 87/51 Map of 62,  88/53 Map of 60  Serial lactates drawn = 3.24, 3.40 and 3.2  Fluid Resuscitated in ED - 2 L's  Thank You, Shellee Milo ,RN Clinical Documentation Specialist:  [phone]  [facility]- Health Information Management

## 2014-09-13 NOTE — Progress Notes (Signed)
PULMONARY / CRITICAL CARE MEDICINE   Name: Melanie Conley MRN: [id number] DOB: 06/06/1990    ADMISSION DATE:  09/11/2014 CONSULTATION DATE:  09/11/14  REFERRING MD :  Dr. Carles Collet   CHIEF COMPLAINT:  GIB   INITIAL PRESENTATION: 24 y/o F with PMH     HISTORY OF PRESENT ILLNESS:  24 y/o F with PMH of primary sclerosing cholangitis, auto-immune hepatitis, known esophageal varices, irregular periods and recent admit 09/04/14 - 09/07/14 for diffuse abdominal pain and worsening ascites.  At that time was found to have diffuse colitis on CT, admitted for IV ABX.and anemia with Hgb drop from 10.2 to 6.5, and confusion.  Paracentesis was performed which was negative for SBP.  She was discharged on cipro & flagyl for 4 more days (to be completed on 8/14). She presented to [facility] on 8/14 with reports of weakness, tremor, lightheadedness and nausea / vomiting / diarrhea for 3 days.  In the ER, she had a witnessed large bloody bowel movement.  Initial Hgb was 10.4 and dropped to 7.1.  She was admitted per [facility] for further care.  GI was consulted for evaluation.  She developed hypotension and PCCM consulted for evaluation.    Labs notable for Hgb of 7.1, platelets 137, Na 132, K 2.8, sr cr 0.61, glucose 115, alk phos 448, AST 114 / ALT 60, ammonia 164 and lactic acid 3.24.  CXR assessed and clear.  CT head assessed for AMS and was negative.  The patient underwent EGD the am of 8/14 and was found to have variceal bleeding.  She was emergently intubated for airway protection.  Esophageal varices were banded per GI.     STUDIES:  8/14  CT Head >> negative  8/14 EGD w/ band ligation of Bleeding gastric varix, Esophageal varices (no bleeding stigmata)  SIGNIFICANT EVENTS: 8/14  Admit with weakness, tremor, lightheadedness and nausea / vomiting / diarrhea for 3 days. 8/15 extubated   SUBJECTIVE:  afebrile Poor UO Denies pain  VITAL SIGNS: Temp:  [98 F (36.7 C)-98.7 F (37.1 C)] 98.7 F (37.1 C) (08/16  0800) Pulse Rate:  [58-97] 67 (08/16 0100) Resp:  [10-30] 11 (08/16 0800) BP: (92-142)/(46-103) 96/56 mmHg (08/16 0800) SpO2:  [95 %-100 %] 98 % (08/16 0600) FiO2 (%):  [40 %] 40 % (08/15 0958)   HEMODYNAMICS: CVP:  [8 mmHg-12 mmHg] 8 mmHg   VENTILATOR SETTINGS: Vent Mode:  [-] PSV FiO2 (%):  [40 %] 40 % PEEP:  [5 cmH20] 5 cmH20 Pressure Support:  [5 cmH20] 5 cmH20   INTAKE / OUTPUT:  Intake/Output Summary (Last 24 hours) at 09/13/14 0837 Last data filed at 09/13/14 0800  Gross per 24 hour  Intake 4432.5 ml  Output    660 ml  Net 3772.5 ml    PHYSICAL EXAMINATION: General:  Chronically ill 24-year-old female Neuro:  Awake, RASS+1, MAE HEENT:  MM pale, dry, jvd present Cardiovascular:  s1s2 rrr, tachy Lungs:  resp's even/non-labored, lungs bilaterally clear  Abdomen:  Distended, tender to palpation, BSx4 active  Musculoskeletal:  No acute deformities  Skin:  Warm/dry, no edema   LABS:  CBC  Recent Labs Lab 09/12/14 0926 09/12/14 1810 09/13/14 0207  WBC 4.8 6.9 9.0  HGB 7.9* 7.8* 8.6*  HCT 23.3* 23.2* 25.7*  PLT 48* 46* 62*   Coag's  Recent Labs Lab 09/11/14 0632 09/12/14 0419 09/13/14 0430  INR 2.64* 1.68* 1.75*   BMET  Recent Labs Lab 09/11/14 0142 09/12/14 0419 09/13/14 0430  NA  132* 142 136  K 2.8* 4.4 4.7  CL 96* 114* 111  CO2 _0 BUN _1 CREATININE 0.61 0.70 0.69  GLUCOSE 115* 152* 140*   Electrolytes  Recent Labs Lab 09/11/14 0142 09/12/14 0419 09/13/14 0430  CALCIUM 7.8* 7.9* 7.9*  MG 1.8 1.5*  --   PHOS  --  2.7  --    Sepsis Markers  Recent Labs Lab 09/11/14 0606 09/11/14 0618 09/11/14 0827 09/12/14 0419 09/13/14 0430  LATICACIDVEN 3.24* 3.4* 3.2*  --   --   PROCALCITON  --   --   --  1.14 0.70   ABG  Recent Labs Lab 09/11/14 1320 09/12/14 0331  PHART 7.376 7.411  PCO2ART 40.0 34.0*  PO2ART 490* 167.0*     Liver Enzymes  Recent Labs Lab 09/11/14 0142 09/12/14 0419 09/13/14 0430  AST  114* 43* 29  ALT 60* 30 26  ALKPHOS 448* 208* 176*  BILITOT 7.5* 5.8* 4.0*  ALBUMIN 2.6* 2.0* 2.0*   Cardiac Enzymes No results for input(s): TROPONINI, PROBNP in the last 168 hours.   Glucose  Recent Labs Lab 09/12/14 1114 09/12/14 1555 09/12/14 2009 09/13/14 0007 09/13/14 0429 09/13/14 0730  GLUCAP 135* 171* 86 120* 129* 128*    Imaging Dg Chest Port 1 View  09/12/2014   CLINICAL DATA:  Central line placement.  EXAM: PORTABLE CHEST - 1 VIEW  COMPARISON:  09/12/2014 at 5:07 a.m.  FINDINGS: Interval removal of endotracheal tube. Right IJ central venous catheter has been pulled back and tip over the SVC below the level of the head of the right clavicle and 2.5 cm above the carina.  Lungs are hypoinflated and otherwise clear. Cardiomediastinal silhouette and remainder of the exam is unchanged.  IMPRESSION: Hypoinflation without acute cardiopulmonary disease.  Right IJ central venous catheter has been pulled back with tip over the SVC just below the head of the right clavicle.  These results were called by telephone at the time of interpretation on 09/12/2014 at 9:09 pm to patient's nurse, May Aninon, who verbally acknowledged these results.   Electronically Signed   By: Marin Olp M.D.   On: 09/12/2014 21:10     ASSESSMENT / PLAN:  PULMONARY OETT 8/14 >>  A: Airway Protection / Respiratory Failure - in setting of GIB, sedation for EGD P:   -can mobilise  CARDIOVASCULAR CVL R IJ 8/14 >> A:  Hypovolemic shock - baseline BP low 100's Baseline Prednisone 68m Dependent  P:  Volume resuscitated with PRBC's  Stress dose steroids, adjust dosing (chronic baseline prednisone) Hold coreg, aldactone for now   RENAL A:   Hypokalemia  Hyponatremia  Lactic Acidosis  P:    Replace electrolytes as indicated  Dc IVfs Resume lasix   GASTROINTESTINAL A:   Autoimmune Hepatitis / Primary Sclerosing Colangitis Abdominal Pain / Diffuse Colitis - s/p paracentesis 8/8, no  SBP Elevated Ammonia  Gastric varices P:   GI Following  Continue protonix / octreotide gtt's  IR consulted for BRTO if rebleed  HEMATOLOGIC A:   Coagulopathy -not on anticoagulation P:  Monitor INR / PT trend  Vitamin K  Given, rpt if INR 2 & above DVT:  SCD's   INFECTIOUS A:   Hx CMV Viremia  c diff colitis P:   BCx2 8/14 >> UC 8/14 >>  8/8 c diff POS  Vanco, start date 8/14>> 8/16 Zosyn, start date 8/14 >> 8/16  Resume  valganciclovir PO Dc Vanc/zosyn , pct low  Resume PO flagyl for c diff  ENDOCRINE A:   Mild Hyperglycemia - suspect stress response + steroids    P:   Monitor glucose on BMP, consider SSI if glucose consistently > 180  NEUROLOGIC A:   Acute Metabolic Encephalopathy - in setting of autoimmune hepatitis + acute GI Bleed, resolving P:   RASS goal: 0 PRN fentanyl only resume lactulose, xifaxin    FAMILY  - Updates:  Mother updated on patients status.    - Inter-disciplinary family meet or Palliative Care meeting due by:  8/20   Summary - Extubated, doing well -Plan for BRTO if rebleed. Planning for transfer to [facility] for transplant evaluation  The patient is critically ill with multiple organ systems failure and requires high complexity decision making for assessment and support, frequent evaluation and titration of therapies, application of advanced monitoring technologies and extensive interpretation of multiple databases. Critical Care Time devoted to patient care services described in this note independent of APP time is 31 minutes.   Kara Mead MD. Shade Flood. [facility] Pager [phone] If no response call [phone]    09/13/2014, 8:37 AM

## 2014-09-14 ENCOUNTER — Ambulatory Visit: Payer: BC Managed Care – PPO | Admitting: Obstetrics and Gynecology

## 2014-09-15 ENCOUNTER — Ambulatory Visit: Payer: BC Managed Care – PPO | Admitting: Adult Health

## 2014-09-16 LAB — CULTURE, BLOOD (ROUTINE X 2)
CULTURE: NO GROWTH
Culture: NO GROWTH

## 2014-09-21 ENCOUNTER — Other Ambulatory Visit (HOSPITAL_COMMUNITY): Payer: Self-pay | Admitting: Internal Medicine

## 2014-09-21 DIAGNOSIS — K8301 Primary sclerosing cholangitis: Secondary | ICD-10-CM

## 2014-09-21 DIAGNOSIS — K754 Autoimmune hepatitis: Secondary | ICD-10-CM

## 2014-09-21 DIAGNOSIS — R188 Other ascites: Secondary | ICD-10-CM

## 2014-09-22 ENCOUNTER — Ambulatory Visit (HOSPITAL_COMMUNITY)
Admission: RE | Admit: 2014-09-22 | Discharge: 2014-09-22 | Disposition: A | Discharge status: Home / Self Care | Payer: BC Managed Care – PPO | Source: Ambulatory Visit | Attending: Internal Medicine | Admitting: Internal Medicine

## 2014-09-22 DIAGNOSIS — K830 Cholangitis: Secondary | ICD-10-CM | POA: Diagnosis not present

## 2014-09-22 DIAGNOSIS — K754 Autoimmune hepatitis: Secondary | ICD-10-CM | POA: Insufficient documentation

## 2014-09-22 DIAGNOSIS — R188 Other ascites: Secondary | ICD-10-CM | POA: Insufficient documentation

## 2014-09-22 DIAGNOSIS — K8301 Primary sclerosing cholangitis: Secondary | ICD-10-CM

## 2014-09-22 LAB — BODY FLUID CELL COUNT WITH DIFFERENTIAL
EOS FL: 0 %
LYMPHS FL: 2 %
MONOCYTE-MACROPHAGE-SEROUS FLUID: 15 % — AB (ref 50–90)
Neutrophil Count, Fluid: 83 % — ABNORMAL HIGH (ref 0–25)
WBC FLUID: 833 uL (ref 0–1000)

## 2014-09-22 LAB — GRAM STAIN

## 2014-09-22 MED ORDER — LIDOCAINE HCL (PF) 1 % IJ SOLN
INTRAMUSCULAR | Status: AC
  Filled 2014-09-22: qty 10

## 2014-09-22 MED ORDER — ALBUMIN HUMAN 25 % IV SOLN
25.0000 g | Freq: Once | INTRAVENOUS | Status: AC
  Administered 2014-09-22: 25 g via INTRAVENOUS
  Filled 2014-09-22: qty 100

## 2014-09-22 NOTE — Procedures (Signed)
US guided diagnostic/therapeutic paracentesis performed yielding 3 liters slightly turbid, yellow fluid. A portion of the fluid was sent to the lab for cell count /diff and culture. No immediate complications. The pt will receive IV albumin postprocedure.

## 2014-09-22 NOTE — Progress Notes (Signed)
Pt arrived via w/c for IV albumin. Ambulated to seat without assist. No signs of distress.

## 2014-09-23 LAB — PATHOLOGIST SMEAR REVIEW

## 2014-09-27 LAB — CULTURE, BODY FLUID-BOTTLE: CULTURE: NO GROWTH

## 2014-09-27 LAB — CULTURE, BODY FLUID W GRAM STAIN -BOTTLE

## 2014-10-04 ENCOUNTER — Telehealth: Payer: Self-pay | Admitting: *Deleted

## 2014-10-04 NOTE — Telephone Encounter (Signed)
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 It is very important that she keep close monitoring of this yearly.

## 2014-10-09 ENCOUNTER — Inpatient Hospital Stay (HOSPITAL_COMMUNITY): Payer: BC Managed Care – PPO

## 2014-10-09 ENCOUNTER — Inpatient Hospital Stay (HOSPITAL_COMMUNITY)
Admission: EM | Admit: 2014-10-09 | Discharge: 2014-10-12 | DRG: 421 | Disposition: A | Discharge status: Home / Self Care | Payer: BC Managed Care – PPO | Attending: Internal Medicine | Admitting: Internal Medicine

## 2014-10-09 ENCOUNTER — Encounter (HOSPITAL_COMMUNITY): Payer: Self-pay | Admitting: Emergency Medicine

## 2014-10-09 DIAGNOSIS — E876 Hypokalemia: Secondary | ICD-10-CM | POA: Diagnosis present

## 2014-10-09 DIAGNOSIS — N19 Unspecified kidney failure: Secondary | ICD-10-CM | POA: Diagnosis not present

## 2014-10-09 DIAGNOSIS — K652 Spontaneous bacterial peritonitis: Secondary | ICD-10-CM | POA: Diagnosis not present

## 2014-10-09 DIAGNOSIS — K7290 Hepatic failure, unspecified without coma: Secondary | ICD-10-CM | POA: Diagnosis not present

## 2014-10-09 DIAGNOSIS — Z87891 Personal history of nicotine dependence: Secondary | ICD-10-CM | POA: Diagnosis not present

## 2014-10-09 DIAGNOSIS — Z7952 Long term (current) use of systemic steroids: Secondary | ICD-10-CM | POA: Diagnosis not present

## 2014-10-09 DIAGNOSIS — D649 Anemia, unspecified: Secondary | ICD-10-CM | POA: Diagnosis present

## 2014-10-09 DIAGNOSIS — K743 Primary biliary cirrhosis: Secondary | ICD-10-CM | POA: Diagnosis present

## 2014-10-09 DIAGNOSIS — R188 Other ascites: Secondary | ICD-10-CM

## 2014-10-09 DIAGNOSIS — K754 Autoimmune hepatitis: Principal | ICD-10-CM | POA: Diagnosis present

## 2014-10-09 DIAGNOSIS — R1110 Vomiting, unspecified: Secondary | ICD-10-CM

## 2014-10-09 DIAGNOSIS — Z79899 Other long term (current) drug therapy: Secondary | ICD-10-CM

## 2014-10-09 DIAGNOSIS — Z515 Encounter for palliative care: Secondary | ICD-10-CM | POA: Diagnosis not present

## 2014-10-09 DIAGNOSIS — R100 Acute abdomen: Secondary | ICD-10-CM | POA: Diagnosis not present

## 2014-10-09 DIAGNOSIS — E871 Hypo-osmolality and hyponatremia: Secondary | ICD-10-CM | POA: Diagnosis present

## 2014-10-09 DIAGNOSIS — D689 Coagulation defect, unspecified: Secondary | ICD-10-CM | POA: Diagnosis present

## 2014-10-09 DIAGNOSIS — R109 Unspecified abdominal pain: Secondary | ICD-10-CM | POA: Diagnosis present

## 2014-10-09 DIAGNOSIS — R52 Pain, unspecified: Secondary | ICD-10-CM

## 2014-10-09 DIAGNOSIS — N179 Acute kidney failure, unspecified: Secondary | ICD-10-CM | POA: Diagnosis present

## 2014-10-09 DIAGNOSIS — D696 Thrombocytopenia, unspecified: Secondary | ICD-10-CM | POA: Diagnosis present

## 2014-10-09 DIAGNOSIS — E119 Type 2 diabetes mellitus without complications: Secondary | ICD-10-CM | POA: Diagnosis present

## 2014-10-09 DIAGNOSIS — Z794 Long term (current) use of insulin: Secondary | ICD-10-CM | POA: Diagnosis not present

## 2014-10-09 DIAGNOSIS — K7460 Unspecified cirrhosis of liver: Secondary | ICD-10-CM | POA: Diagnosis not present

## 2014-10-09 HISTORY — DX: Other ascites: R18.8

## 2014-10-09 HISTORY — DX: Hepatic encephalopathy: K76.82

## 2014-10-09 HISTORY — DX: Splenomegaly, not elsewhere classified: R16.1

## 2014-10-09 HISTORY — DX: Unspecified sexually transmitted disease: A64

## 2014-10-09 HISTORY — DX: Hepatic failure, unspecified without coma: K72.90

## 2014-10-09 HISTORY — DX: Coagulation defect, unspecified: D68.9

## 2014-10-09 LAB — BODY FLUID CELL COUNT WITH DIFFERENTIAL
LYMPHS FL: 9 %
MONOCYTE-MACROPHAGE-SEROUS FLUID: 86 % (ref 50–90)
Neutrophil Count, Fluid: 5 % (ref 0–25)
Total Nucleated Cell Count, Fluid: 20 cu mm (ref 0–1000)

## 2014-10-09 LAB — CBC WITH DIFFERENTIAL/PLATELET
BASOS ABS: 0.1 10*3/uL (ref 0.0–0.1)
BASOS ABS: 0.1 10*3/uL (ref 0.0–0.1)
BASOS PCT: 1 % (ref 0–1)
Basophils Relative: 1 % (ref 0–1)
EOS ABS: 0.1 10*3/uL (ref 0.0–0.7)
EOS PCT: 1 % (ref 0–5)
Eosinophils Absolute: 0.1 10*3/uL (ref 0.0–0.7)
Eosinophils Relative: 1 % (ref 0–5)
HCT: 26.4 % — ABNORMAL LOW (ref 36.0–46.0)
HEMATOCRIT: 23.5 % — AB (ref 36.0–46.0)
HEMOGLOBIN: 7.8 g/dL — AB (ref 12.0–15.0)
Hemoglobin: 8.8 g/dL — ABNORMAL LOW (ref 12.0–15.0)
LYMPHS PCT: 12 % (ref 12–46)
LYMPHS PCT: 10 % — AB (ref 12–46)
Lymphs Abs: 0.8 10*3/uL (ref 0.7–4.0)
Lymphs Abs: 0.9 10*3/uL (ref 0.7–4.0)
MCH: 32.6 pg (ref 26.0–34.0)
MCH: 32.4 pg (ref 26.0–34.0)
MCHC: 33.3 g/dL (ref 30.0–36.0)
MCHC: 33.2 g/dL (ref 30.0–36.0)
MCV: 97.8 fL (ref 78.0–100.0)
MCV: 97.5 fL (ref 78.0–100.0)
MONOS PCT: 5 % (ref 3–12)
Monocytes Absolute: 0.3 10*3/uL (ref 0.1–1.0)
Monocytes Absolute: 0.4 10*3/uL (ref 0.1–1.0)
Monocytes Relative: 4 % (ref 3–12)
NEUTROS PCT: 82 % — AB (ref 43–77)
Neutro Abs: 5.3 10*3/uL (ref 1.7–7.7)
Neutro Abs: 7.3 10*3/uL (ref 1.7–7.7)
Neutrophils Relative %: 83 % — ABNORMAL HIGH (ref 43–77)
PLATELETS: 238 10*3/uL (ref 150–400)
Platelets: 245 10*3/uL (ref 150–400)
RBC: 2.7 MIL/uL — ABNORMAL LOW (ref 3.87–5.11)
RBC: 2.41 MIL/uL — AB (ref 3.87–5.11)
RDW: 23.4 % — ABNORMAL HIGH (ref 11.5–15.5)
RDW: 23.5 % — AB (ref 11.5–15.5)
WBC: 6.6 10*3/uL (ref 4.0–10.5)
WBC: 8.8 10*3/uL (ref 4.0–10.5)

## 2014-10-09 LAB — PROTIME-INR
INR: 1.38 (ref 0.00–1.49)
Prothrombin Time: 17 seconds — ABNORMAL HIGH (ref 11.6–15.2)

## 2014-10-09 LAB — COMPREHENSIVE METABOLIC PANEL
ALBUMIN: 2.9 g/dL — AB (ref 3.5–5.0)
ALBUMIN: 2.5 g/dL — AB (ref 3.5–5.0)
ALK PHOS: 374 U/L — AB (ref 38–126)
ALT: 23 U/L (ref 14–54)
ALT: 23 U/L (ref 14–54)
AST: 38 U/L (ref 15–41)
AST: 36 U/L (ref 15–41)
Alkaline Phosphatase: 439 U/L — ABNORMAL HIGH (ref 38–126)
Anion gap: 14 (ref 5–15)
Anion gap: 10 (ref 5–15)
BILIRUBIN TOTAL: 10.5 mg/dL — AB (ref 0.3–1.2)
BUN: 28 mg/dL — AB (ref 6–20)
BUN: 28 mg/dL — AB (ref 6–20)
CALCIUM: 8.4 mg/dL — AB (ref 8.9–10.3)
CHLORIDE: 92 mmol/L — AB (ref 101–111)
CO2: 27 mmol/L (ref 22–32)
CO2: 29 mmol/L (ref 22–32)
Calcium: 8.8 mg/dL — ABNORMAL LOW (ref 8.9–10.3)
Chloride: 91 mmol/L — ABNORMAL LOW (ref 101–111)
Creatinine, Ser: 1.99 mg/dL — ABNORMAL HIGH (ref 0.44–1.00)
Creatinine, Ser: 1.98 mg/dL — ABNORMAL HIGH (ref 0.44–1.00)
GFR calc Af Amer: 40 mL/min — ABNORMAL LOW (ref >60)
GFR calc Af Amer: 40 mL/min — ABNORMAL LOW (ref >60)
GFR, EST NON AFRICAN AMERICAN: 34 mL/min — AB (ref >60)
GFR, EST NON AFRICAN AMERICAN: 34 mL/min — AB (ref >60)
GLUCOSE: 91 mg/dL (ref 65–99)
GLUCOSE: 106 mg/dL — AB (ref 65–99)
POTASSIUM: 2.2 mmol/L — AB (ref 3.5–5.1)
Potassium: 3 mmol/L — ABNORMAL LOW (ref 3.5–5.1)
SODIUM: 133 mmol/L — AB (ref 135–145)
Sodium: 130 mmol/L — ABNORMAL LOW (ref 135–145)
TOTAL PROTEIN: 5.6 g/dL — AB (ref 6.5–8.1)
Total Bilirubin: 11.9 mg/dL — ABNORMAL HIGH (ref 0.3–1.2)
Total Protein: 6.4 g/dL — ABNORMAL LOW (ref 6.5–8.1)

## 2014-10-09 LAB — LACTATE DEHYDROGENASE, PLEURAL OR PERITONEAL FLUID: LD, Fluid: 12 U/L (ref 3–23)

## 2014-10-09 LAB — GLUCOSE, CAPILLARY
GLUCOSE-CAPILLARY: 115 mg/dL — AB (ref 65–99)
GLUCOSE-CAPILLARY: 128 mg/dL — AB (ref 65–99)
Glucose-Capillary: 106 mg/dL — ABNORMAL HIGH (ref 65–99)

## 2014-10-09 LAB — URINALYSIS, ROUTINE W REFLEX MICROSCOPIC
GLUCOSE, UA: NEGATIVE mg/dL
Hgb urine dipstick: NEGATIVE
Ketones, ur: NEGATIVE mg/dL
Nitrite: NEGATIVE
PROTEIN: NEGATIVE mg/dL
Specific Gravity, Urine: 1.011 (ref 1.005–1.030)
Urobilinogen, UA: 0.2 mg/dL (ref 0.0–1.0)
pH: 6.5 (ref 5.0–8.0)

## 2014-10-09 LAB — LIPASE, BLOOD: LIPASE: 55 U/L — AB (ref 22–51)

## 2014-10-09 LAB — LACTIC ACID, PLASMA: Lactic Acid, Venous: 1.3 mmol/L (ref 0.5–2.0)

## 2014-10-09 LAB — PROTEIN, BODY FLUID

## 2014-10-09 LAB — URINE MICROSCOPIC-ADD ON

## 2014-10-09 LAB — SODIUM, URINE, RANDOM: Sodium, Ur: 14 mmol/L

## 2014-10-09 LAB — MRSA PCR SCREENING: MRSA BY PCR: NEGATIVE

## 2014-10-09 LAB — POC URINE PREG, ED: Preg Test, Ur: NEGATIVE

## 2014-10-09 LAB — GRAM STAIN: Gram Stain: NONE SEEN

## 2014-10-09 LAB — APTT: APTT: 31 s (ref 24–37)

## 2014-10-09 MED ORDER — PREDNISONE 5 MG PO TABS: 5.0000 mg | ORAL_TABLET | Freq: Every day | ORAL | Status: DC

## 2014-10-09 MED ORDER — INSULIN ASPART 100 UNIT/ML ~~LOC~~ SOLN: 0.0000 [IU] | Freq: Three times a day (TID) | SUBCUTANEOUS | Status: DC

## 2014-10-09 MED ORDER — HYDROCORTISONE NA SUCCINATE PF 100 MG IJ SOLR
50.0000 mg | Freq: Once | INTRAMUSCULAR | Status: AC
  Administered 2014-10-09: 50 mg via INTRAVENOUS
  Filled 2014-10-09: qty 1

## 2014-10-09 MED ORDER — VALGANCICLOVIR HCL 450 MG PO TABS
900.0000 mg | ORAL_TABLET | Freq: Two times a day (BID) | ORAL | Status: DC
  Administered 2014-10-09 – 2014-10-12 (×7): 900 mg via ORAL
  Filled 2014-10-09 (×9): qty 2

## 2014-10-09 MED ORDER — LACTULOSE 10 GM/15ML PO SOLN
20.0000 g | Freq: Two times a day (BID) | ORAL | Status: DC
  Administered 2014-10-09 – 2014-10-12 (×7): 20 g via ORAL
  Filled 2014-10-09 (×9): qty 30

## 2014-10-09 MED ORDER — HYDROMORPHONE HCL 1 MG/ML IJ SOLN
1.0000 mg | Freq: Once | INTRAMUSCULAR | Status: AC
  Administered 2014-10-09: 1 mg via INTRAVENOUS
  Filled 2014-10-09: qty 1

## 2014-10-09 MED ORDER — MORPHINE SULFATE (PF) 2 MG/ML IV SOLN
1.0000 mg | INTRAVENOUS | Status: DC | PRN
  Administered 2014-10-09: 1 mg via INTRAVENOUS
  Filled 2014-10-09: qty 1

## 2014-10-09 MED ORDER — AZATHIOPRINE 50 MG PO TABS: 25.0000 mg | ORAL_TABLET | Freq: Every day | ORAL | Status: DC

## 2014-10-09 MED ORDER — LIDOCAINE HCL (PF) 1 % IJ SOLN
INTRAMUSCULAR | Status: AC
  Filled 2014-10-09: qty 10

## 2014-10-09 MED ORDER — CEFOTAXIME SODIUM 1 G IJ SOLR
2.0000 g | Freq: Three times a day (TID) | INTRAMUSCULAR | Status: DC
  Filled 2014-10-09 (×3): qty 2

## 2014-10-09 MED ORDER — PANTOPRAZOLE SODIUM 40 MG IV SOLR: 40.0000 mg | Freq: Two times a day (BID) | INTRAVENOUS | Status: DC

## 2014-10-09 MED ORDER — ALBUMIN HUMAN 25 % IV SOLN
50.0000 g | Freq: Once | INTRAVENOUS | Status: AC
  Administered 2014-10-09: 50 g via INTRAVENOUS
  Filled 2014-10-09: qty 200

## 2014-10-09 MED ORDER — PANTOPRAZOLE SODIUM 40 MG IV SOLR
40.0000 mg | Freq: Two times a day (BID) | INTRAVENOUS | Status: DC
  Administered 2014-10-09 – 2014-10-11 (×5): 40 mg via INTRAVENOUS
  Filled 2014-10-09 (×6): qty 40

## 2014-10-09 MED ORDER — POTASSIUM CHLORIDE 10 MEQ/100ML IV SOLN: 10.0000 meq | Freq: Once | INTRAVENOUS | Status: DC

## 2014-10-09 MED ORDER — RIFAXIMIN 550 MG PO TABS
550.0000 mg | ORAL_TABLET | Freq: Two times a day (BID) | ORAL | Status: DC
  Administered 2014-10-09 – 2014-10-12 (×7): 550 mg via ORAL
  Filled 2014-10-09 (×7): qty 1

## 2014-10-09 MED ORDER — POTASSIUM CHLORIDE 10 MEQ/100ML IV SOLN
10.0000 meq | INTRAVENOUS | Status: AC
  Administered 2014-10-09 (×3): 10 meq via INTRAVENOUS
  Filled 2014-10-09 (×3): qty 100

## 2014-10-09 MED ORDER — DEXTROSE 5 % IV SOLN: INTRAVENOUS | Status: DC

## 2014-10-09 MED ORDER — AZATHIOPRINE 50 MG PO TABS
25.0000 mg | ORAL_TABLET | Freq: Every day | ORAL | Status: DC
  Administered 2014-10-09 – 2014-10-12 (×4): 25 mg via ORAL
  Filled 2014-10-09 (×4): qty 1

## 2014-10-09 MED ORDER — ALBUTEROL SULFATE (2.5 MG/3ML) 0.083% IN NEBU: 2.5000 mg | INHALATION_SOLUTION | RESPIRATORY_TRACT | Status: DC | PRN

## 2014-10-09 MED ORDER — FENTANYL CITRATE (PF) 100 MCG/2ML IJ SOLN
50.0000 ug | Freq: Once | INTRAMUSCULAR | Status: AC
  Administered 2014-10-09: 50 ug via INTRAVENOUS
  Filled 2014-10-09: qty 2

## 2014-10-09 MED ORDER — VALGANCICLOVIR HCL 450 MG PO TABS: 900.0000 mg | ORAL_TABLET | Freq: Two times a day (BID) | ORAL | Status: DC

## 2014-10-09 MED ORDER — AZATHIOPRINE 50 MG PO TABS: 50.0000 mg | ORAL_TABLET | Freq: Every day | ORAL | Status: DC

## 2014-10-09 MED ORDER — ONDANSETRON HCL 4 MG/2ML IJ SOLN
4.0000 mg | Freq: Once | INTRAMUSCULAR | Status: AC | PRN
  Administered 2014-10-09: 4 mg via INTRAVENOUS

## 2014-10-09 MED ORDER — POTASSIUM CHLORIDE 10 MEQ/100ML IV SOLN
10.0000 meq | Freq: Once | INTRAVENOUS | Status: AC
  Administered 2014-10-09: 10 meq via INTRAVENOUS
  Filled 2014-10-09: qty 100

## 2014-10-09 MED ORDER — METRONIDAZOLE IN NACL 5-0.79 MG/ML-% IV SOLN
500.0000 mg | Freq: Three times a day (TID) | INTRAVENOUS | Status: DC
  Administered 2014-10-09 – 2014-10-10 (×5): 500 mg via INTRAVENOUS
  Filled 2014-10-09 (×6): qty 100

## 2014-10-09 MED ORDER — URSODIOL 300 MG PO CAPS: 300.0000 mg | ORAL_CAPSULE | Freq: Two times a day (BID) | ORAL | Status: DC

## 2014-10-09 MED ORDER — ALBUMIN HUMAN 5 % IV SOLN: 25.0000 g | Freq: Once | INTRAVENOUS | Status: DC

## 2014-10-09 MED ORDER — ONDANSETRON HCL 4 MG/2ML IJ SOLN
4.0000 mg | Freq: Four times a day (QID) | INTRAMUSCULAR | Status: AC | PRN
  Administered 2014-10-09: 4 mg via INTRAVENOUS
  Filled 2014-10-09: qty 2

## 2014-10-09 MED ORDER — ONDANSETRON HCL 4 MG/2ML IJ SOLN
INTRAMUSCULAR | Status: AC
  Filled 2014-10-09: qty 2

## 2014-10-09 MED ORDER — DEXTROSE 5 % IV SOLN
2.0000 g | Freq: Three times a day (TID) | INTRAVENOUS | Status: DC
  Administered 2014-10-09 – 2014-10-11 (×6): 2 g via INTRAVENOUS
  Filled 2014-10-09 (×7): qty 2

## 2014-10-09 MED ORDER — HEPARIN SODIUM (PORCINE) 5000 UNIT/ML IJ SOLN: 5000.0000 [IU] | Freq: Three times a day (TID) | INTRAMUSCULAR | Status: DC

## 2014-10-09 MED ORDER — HYDROMORPHONE HCL 1 MG/ML IJ SOLN: 0.5000 mg | INTRAMUSCULAR | Status: DC | PRN

## 2014-10-09 MED ORDER — SODIUM CHLORIDE 0.9 % IV SOLN
INTRAVENOUS | Status: DC
  Administered 2014-10-09 – 2014-10-10 (×2): via INTRAVENOUS

## 2014-10-09 MED ORDER — HYDROMORPHONE HCL 1 MG/ML IJ SOLN
0.5000 mg | INTRAMUSCULAR | Status: DC | PRN
  Administered 2014-10-09 (×3): 1 mg via INTRAVENOUS
  Administered 2014-10-10: 0.5 mg via INTRAVENOUS
  Administered 2014-10-10 (×3): 1 mg via INTRAVENOUS
  Administered 2014-10-10 – 2014-10-11 (×2): 0.5 mg via INTRAVENOUS
  Administered 2014-10-11: 1 mg via INTRAVENOUS
  Administered 2014-10-11 (×2): 0.5 mg via INTRAVENOUS
  Administered 2014-10-11 – 2014-10-12 (×4): 1 mg via INTRAVENOUS
  Filled 2014-10-09 (×16): qty 1

## 2014-10-09 MED ORDER — DEXTROSE 5 % IV SOLN: 1.0000 g | INTRAVENOUS | Status: DC

## 2014-10-09 MED ORDER — INSULIN ASPART 100 UNIT/ML ~~LOC~~ SOLN: SUBCUTANEOUS | Status: DC

## 2014-10-09 MED ORDER — PREDNISONE 5 MG PO TABS
5.0000 mg | ORAL_TABLET | Freq: Every day | ORAL | Status: DC
  Administered 2014-10-10 – 2014-10-12 (×3): 5 mg via ORAL
  Filled 2014-10-09 (×5): qty 1

## 2014-10-09 MED ORDER — URSODIOL 300 MG PO CAPS
300.0000 mg | ORAL_CAPSULE | Freq: Two times a day (BID) | ORAL | Status: DC
  Administered 2014-10-09 – 2014-10-12 (×7): 300 mg via ORAL
  Filled 2014-10-09 (×9): qty 1

## 2014-10-09 NOTE — H&P (Signed)
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 She had occasional nausea and vomited only once. No diarrhea. She had a bowel movement every 2 days without blood or dark stool. She had no fever or chills. No chest pain or dyspnea. No other complaints.   Review of Systems:  CONSTITUTIONAL:  No night sweats.  No fatigue, malaise, lethargy.  No fever or chills. Eyes:  No visual changes.  No eye pain.  No eye discharge.   ENT:    No epistaxis.  No sinus pain.  No sore throat.  No ear pain.  No congestion. RESPIRATORY:  No cough.  No wheeze.  No hemoptysis.  No shortness of breath. CARDIOVASCULAR:  No chest pains.  No palpitations. GASTROINTESTINAL:  abdominal pain.  nausea or vomiting.  constipation.  No hematemesis.  No hematochezia.  No melena. GENITOURINARY:  No urgency.  No frequency.  No dysuria.  No hematuria.  No obstructive symptoms.  No discharge.  No pain.  No significant abnormal bleeding. MUSCULOSKELETAL:  No musculoskeletal pain.  No joint swelling.  No arthritis. NEUROLOGICAL:  No confusion.  No weakness. No headache. No seizure. PSYCHIATRIC:  No depression. No anxiety. No suicidal ideation. SKIN:  No rashes.  No lesions.  No wounds. ENDOCRINE:  No unexplained weight loss.  No polydipsia.  No polyuria.  No polyphagia. HEMATOLOGIC:  No anemia.  No purpura.  No petechiae.  No bleeding.  ALLERGIC AND IMMUNOLOGIC:  No pruritus.  No swelling Other:  Past Medical and Surgical History:   Past Medical History  Diagnosis Date   . Anemia   . Esophageal varices   . Autoimmune hepatitis   . GIB (gastrointestinal bleeding)   . Cirrhosis   . Primary sclerosing cholangitis   . Irregular periods 08/04/2014  . Vaginal Pap smear, abnormal    Past Surgical History  Procedure Laterality Date  . Gastric banding port revision    . Tonsillectomy    . Esophagogastroduodenoscopy  07/22/2011    Procedure: ESOPHAGOGASTRODUODENOSCOPY (EGD);  Surgeon: Graylin Shiver, MD;  Location: [facility] ENDOSCOPY;  Service: Endoscopy;  Laterality: N/A;  . Esophagogastroduodenoscopy N/A 09/11/2014    Procedure: ESOPHAGOGASTRODUODENOSCOPY (EGD);  Surgeon: Charlott Rakes, MD;  Location: [facility] ENDOSCOPY;  Service: Endoscopy;  Laterality: N/A;    Social History:   reports that she has quit smoking. She has never used smokeless tobacco. She reports that she does not drink alcohol or use illicit drugs.   No Known Allergies  Family History  Problem Relation Age of Onset  . Adopted: Yes     Prior to Admission medications   Medication Sig Start Date End Date Taking? Authorizing Provider  albuterol (PROVENTIL) (2.5 MG/3ML) 0.083% nebulizer solution Take 2.5 mg by nebulization every 4 (four) hours as needed for wheezing or shortness of breath.  07/14/14   Historical Provider, MD  azaTHIOprine (IMURAN) 50 MG tablet Take 50 mg by mouth daily. 06/21/14   Historical Provider, MD  furosemide (LASIX) 20 MG tablet Take 1 tablet (20 mg total) by mouth daily. 09/13/14  Vilinda Blanks Minor, NP  hydrocortisone sodium succinate (SOLU-CORTEF) 100 MG SOLR injection Inject 1 mL (50 mg total) into the vein every 6 (six) hours. 09/13/14   Vilinda Blanks Minor, NP  insulin aspart (NOVOLOG) 100 UNIT/ML injection Inject 1-3 Units into the skin every 4 (four) hours. 09/13/14   Vilinda Blanks Minor, NP  lactulose (CHRONULAC) 10 GM/15ML solution Take 30 mLs (20 g total) by mouth 2 (two) times daily. 09/13/14   Vilinda Blanks Minor, NP  metroNIDAZOLE (FLAGYL) 500 MG tablet Take 1 tablet (500 mg total)  by mouth 3 (three) times daily. 09/06/14   Hollice Espy, MD  octreotide (SANDOSTATIN) 2 mcg/mL SOLN Inject 100 mcg into the vein once. 09/13/14   Vilinda Blanks Minor, NP  octreotide 500 mcg in sodium chloride 0.9 % 250 mL Inject 50 mcg/hr into the vein continuous. 09/13/14   Vilinda Blanks Minor, NP  ondansetron (ZOFRAN) 4 MG tablet Take 1 tablet (4 mg total) by mouth every 6 (six) hours as needed for nausea. 09/13/14   Vilinda Blanks Minor, NP  pantoprazole 80 mg in sodium chloride 0.9 % 250 mL Inject 8 mg/hr into the vein continuous. 09/13/14   Vilinda Blanks Minor, NP  rifaximin (XIFAXAN) 550 MG TABS tablet Take 1 tablet (550 mg total) by mouth 2 (two) times daily. 09/13/14   Vilinda Blanks Minor, NP  sodium chloride 0.9 % infusion Inject 10 mLs into the vein continuous. 09/13/14   Vilinda Blanks Minor, NP    Physical Exam: BP 93/49 mmHg  Pulse 102  Temp(Src) 97.7 F (36.5 C) (Oral)  Resp 21  SpO2 96%  GENERAL : Well developed, well nourished, alert and cooperative, and appears to be in moderate acute distress. HEAD: normocephalic. EYES: PERRL, EOMI. Fundi normal, vision is grossly intact. EARS: External auditory canals and tympanic membranes clear, hearing grossly intact. NOSE: No nasal discharge. THROAT: Oral cavity and pharynx normal. .  NECK: Neck supple, non-tender . CARDIAC: Normal S1 and S2. No S3, S4 or murmurs. Rhythm is regular. There is no peripheral edema, cyanosis or pallor.  LUNGS: Clear to auscultation and percussion without rales, rhonchi, wheezing or diminished breath sounds. ABDOMEN: Positive bowel sounds. Soft, distended, diffusely tender. no guarding. No rebound. No masses. EXTREMITIES: No significant deformity or joint abnormality. No edema.  NEUROLOGICAL: The mental examination revealed the patient was oriented to person, place, and time.CN II-XII intact. Strength and sensation symmetric and intact throughout.  SKIN: Skin normal color, texture and turgor with no lesions or  eruptions. PSYCHIATRIC:  The patient was able to demonstrate good judgement and reason, without hallucinations, abnormal affect or abnormal behaviors during the examination. Patient is not suicidal.          Labs on Admission:  Reviewed.   Radiological Exams on Admission: No results found.   Assessment/Plan  Abdominal pain:  Due to worsening ascites vs. PBC vs. chololithiasis vs. SBP Elevated ALP and TB suggest an obstructive etiology.  Will consult IR in am for taping ascites Will cover for suspected SBP with ceftriaxone 1 gm daily. Bcx sent Will check abd [place] to r/o cholilithiasis Lipase neg Morphine prn pain Keep NPO for now Consult GI in am Will check lactic acid, follow liver enzymes and check INR/PTT No astrexis or hepatic encephalopathy. Continue lactulose  DM:  Switch to low dose correction with monitoring BS at AC/QHS  Hypokalemia:  Possible due to lasix Given 10 meq in the ER Will give another 20  Meq and repeat K in am.  Monitor in setting of ARF  Acute kidney injury:  Possible due to hepatorenal syndrome Will check renal US to r/o post renal causes Will attempt intravascular volume expansion with albumin 25 gm Will consult Nephro Continue octreotide   DVT prophylaxis: [place] hep GI prophylaxis: PPI IV Consultants: Nephro, GI, IR.  Code Status: Full  Family Communication: None at bedside   Disposition Plan: Loretha Stapler M.D [facility]

## 2014-10-09 NOTE — ED Notes (Signed)
Attempted report.  Nurse to call back when ready.

## 2014-10-09 NOTE — Consult Note (Signed)
Consultation  Referring Provider: [facility] Primary Care Physician:  Woodfin Ganja, MD Primary Gastroenterologist:  [facility] Hepatology- Dr Piedad Climes.  Reason for Consultation:  Decompensated cirrhosis/abdominal pain ? SBP  HPI: Melanie Conley is a 24 y.o. female followed at [facility] hepatology with history unfortunately of decompensated cirrhosis, autoimmune hepatitis/PSC. She was last admitted here in August 2016 at which time she had presented with an acute variceal hemorrhage. She underwent EGD with Dr. Bosie Clos on 09/11/2014 with finding of medium distal varices and a bleeding gastric varix which was banded. She was then transferred to [facility] for further management. She presents this morning to the emergency room with complaints of 2 day history of rather diffuse abdominal pain which is been worse across her upper abdomen and progressive. Her pain is now constant and severe. She has not had any fever or chills no diarrhea no hematemesis or melena. Appetite has been decreased and she has not eaten. She has history of ascites and says her abdomen is much larger and tighter than usual. She also with history of insulin-dependent diabetes mellitus. She is afebrile on admission normotensive, KUB is negative for free air or obstruction. Limited upper abdominal ultrasound negative with the exception of ascites. Paracentesis is pending Labs are reviewed, INR 1.38, lactic acid 1.3, creatinine 1.88, total bilirubin 10.5 MELD is 25 today  MELD - NA 29   Past Medical History  Diagnosis Date  . Anemia   . Esophageal varices   . Autoimmune hepatitis   . GIB (gastrointestinal bleeding)   . Cirrhosis   . Primary sclerosing cholangitis   . Irregular periods 08/04/2014  . Vaginal Pap smear, abnormal     Past Surgical History  Procedure Laterality Date  . Gastric banding port revision    . Tonsillectomy    . Esophagogastroduodenoscopy  07/22/2011    Procedure: ESOPHAGOGASTRODUODENOSCOPY (EGD);   Surgeon: Graylin Shiver, MD;  Location: [facility] ENDOSCOPY;  Service: Endoscopy;  Laterality: N/A;  . Esophagogastroduodenoscopy N/A 09/11/2014    Procedure: ESOPHAGOGASTRODUODENOSCOPY (EGD);  Surgeon: Charlott Rakes, MD;  Location: [facility] ENDOSCOPY;  Service: Endoscopy;  Laterality: N/A;    Prior to Admission medications   Medication Sig Start Date End Date Taking? Authorizing Provider  albuterol (PROVENTIL) (2.5 MG/3ML) 0.083% nebulizer solution Take 2.5 mg by nebulization every 4 (four) hours as needed for wheezing or shortness of breath.  07/14/14   Historical Provider, MD  azaTHIOprine (IMURAN) 50 MG tablet Take 50 mg by mouth daily. 06/21/14   Historical Provider, MD  furosemide (LASIX) 20 MG tablet Take 1 tablet (20 mg total) by mouth daily. 09/13/14   Vilinda Blanks Minor, NP  hydrocortisone sodium succinate (SOLU-CORTEF) 100 MG SOLR injection Inject 1 mL (50 mg total) into the vein every 6 (six) hours. 09/13/14   Vilinda Blanks Minor, NP  insulin aspart (NOVOLOG) 100 UNIT/ML injection Inject 1-3 Units into the skin every 4 (four) hours. 09/13/14   Vilinda Blanks Minor, NP  lactulose (CHRONULAC) 10 GM/15ML solution Take 30 mLs (20 g total) by mouth 2 (two) times daily. 09/13/14   Vilinda Blanks Minor, NP  metroNIDAZOLE (FLAGYL) 500 MG tablet Take 1 tablet (500 mg total) by mouth 3 (three) times daily. 09/06/14   Hollice Espy, MD  octreotide (SANDOSTATIN) 2 mcg/mL SOLN Inject 100 mcg into the vein once. 09/13/14   Vilinda Blanks Minor, NP  octreotide 500 mcg in sodium chloride 0.9 % 250 mL Inject 50 mcg/hr into the vein continuous. 09/13/14   Vilinda Blanks Minor, NP  ondansetron (ZOFRAN) 4 MG tablet Take 1 tablet (4 mg total) by mouth every 6 (six) hours as needed for nausea. 09/13/14   Vilinda Blanks Minor, NP  pantoprazole 80 mg in sodium chloride 0.9 % 250 mL Inject 8 mg/hr into the vein continuous. 09/13/14   Vilinda Blanks Minor, NP  rifaximin (XIFAXAN) 550 MG TABS tablet Take 1 tablet (550 mg total) by mouth 2 (two) times daily. 09/13/14    Vilinda Blanks Minor, NP  sodium chloride 0.9 % infusion Inject 10 mLs into the vein continuous. 09/13/14   Vilinda Blanks Minor, NP    Current Facility-Administered Medications  Medication Dose Route Frequency Provider Last Rate Last Dose  . 0.9 %  sodium chloride infusion   Intravenous Continuous Shanker Levora Dredge, MD      . albumin human 25 % solution 50 g  50 g Intravenous Once Shanker Levora Dredge, MD      . albuterol (PROVENTIL) (2.5 MG/3ML) 0.083% nebulizer solution 2.5 mg  2.5 mg Nebulization Q4H PRN Eston Esters, MD      . azaTHIOprine (IMURAN) tablet 25 mg  25 mg Oral Daily Shanker Levora Dredge, MD      . cefoTAXime (CLAFORAN) 2 g in dextrose 5 % 50 mL IVPB  2 g Intravenous 3 times per day Maretta Bees, MD      . heparin injection 5,000 Units  5,000 Units Subcutaneous 3 times per day Eston Esters, MD      . hydrocortisone sodium succinate (SOLU-CORTEF) 100 MG injection 50 mg  50 mg Intravenous Once Shanker Levora Dredge, MD      . insulin aspart (novoLOG) injection 0-9 Units  0-9 Units Subcutaneous TID WC Eston Esters, MD      . lactulose (CHRONULAC) 10 GM/15ML solution 20 g  20 g Oral BID Eston Esters, MD      . morphine 2 MG/ML injection 1 mg  1 mg Intravenous Q3H PRN Eston Esters, MD      . pantoprazole (PROTONIX) injection 40 mg  40 mg Intravenous Q12H Shanker Levora Dredge, MD      . potassium chloride 10 mEq in 100 mL IVPB  10 mEq Intravenous Q1 Hr x 3 Shanker Levora Dredge, MD      . Melene Muller ON 10/10/2014] predniSONE (DELTASONE) tablet 5 mg  5 mg Oral Q breakfast Shanker Levora Dredge, MD      . rifaximin Burman Blacksmith) tablet 550 mg  550 mg Oral BID Shanker Levora Dredge, MD      . ursodiol (ACTIGALL) capsule 300 mg  300 mg Oral BID Shanker Levora Dredge, MD      . valGANciclovir (VALCYTE) 450 MG tablet TABS 900 mg  900 mg Oral BID Maretta Bees, MD       Current Outpatient Prescriptions  Medication Sig Dispense Refill  . albuterol (PROVENTIL) (2.5 MG/3ML) 0.083% nebulizer solution Take 2.5 mg by nebulization every 4  (four) hours as needed for wheezing or shortness of breath.     . azaTHIOprine (IMURAN) 50 MG tablet Take 50 mg by mouth daily.    . furosemide (LASIX) 20 MG tablet Take 1 tablet (20 mg total) by mouth daily. 30 tablet   . hydrocortisone sodium succinate (SOLU-CORTEF) 100 MG SOLR injection Inject 1 mL (50 mg total) into the vein every 6 (six) hours. 1 each   . insulin aspart (NOVOLOG) 100 UNIT/ML injection Inject 1-3 Units into the skin every 4 (four) hours. 10 mL 11  . lactulose (CHRONULAC) 10 GM/15ML solution  Take 30 mLs (20 g total) by mouth 2 (two) times daily. 240 mL 0  . metroNIDAZOLE (FLAGYL) 500 MG tablet Take 1 tablet (500 mg total) by mouth 3 (three) times daily. 12 tablet 0  . octreotide (SANDOSTATIN) 2 mcg/mL SOLN Inject 100 mcg into the vein once.    Marland Kitchen octreotide 500 mcg in sodium chloride 0.9 % 250 mL Inject 50 mcg/hr into the vein continuous.    . ondansetron (ZOFRAN) 4 MG tablet Take 1 tablet (4 mg total) by mouth every 6 (six) hours as needed for nausea. 20 tablet 0  . pantoprazole 80 mg in sodium chloride 0.9 % 250 mL Inject 8 mg/hr into the vein continuous.    . rifaximin (XIFAXAN) 550 MG TABS tablet Take 1 tablet (550 mg total) by mouth 2 (two) times daily. 60 tablet   . sodium chloride 0.9 % infusion Inject 10 mLs into the vein continuous.  0    Allergies as of 10/09/2014  . (No Known Allergies)    Family History  Problem Relation Age of Onset  . Adopted: Yes    Social History   Social History  . Marital Status: Single    Spouse Name: N/A  . Number of Children: N/A  . Years of Education: N/A   Occupational History  . Not on file.   Social History Main Topics  . Smoking status: Former Smoker -- 0.00 packs/day for 0 years  . Smokeless tobacco: Never Used  . Alcohol Use: No  . Drug Use: No  . Sexual Activity: Yes    Birth Control/ Protection: None   Other Topics Concern  . 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       History of Present Illness: Patient is a pleasant 24 year old [place] American female who was diagnosed with liver disease at age 89 when she presented with a GI bleed. She has undergone liver biopsy and ERCP in 2003. 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 Her complications of cirrhosis include bleeding esophageal varicies, ascites, SBP and encephalopathy. She was listed for OLT at [facility] in 2006 but made status 7 due to a low MELD score and a good quality of life. We have been unable to reactivate her as an adult because she has not passed the psychosocial evaluation. She has undergone formal neuropsych testing. In the past, she has stated that she does not wish to undergo OLT even if she needs it. In regards to her AIH, her serologies are negative and she has been on Imuran and prednisone since her diagnosis in 2003. Regarding her PSC, she has had one bout of cholangitis years ago per report. She denies a history of stenting or dominant biliary stricture or choledolcholithiasis. She was previously on high dose ursodiol.       She presents today with her Mom. I last saw her in Feb 2015. She has been hospitalized here at [facility] for abdominal pain/worsening ascites in 11/2013 and the again at [facility] in 12/2013 for SBP despite being on Levoquin prophylaxis. Her outside records from 12/2013 were reviewed in Care Everywhere. Ascites fluid showed 7866 nuc cells with 83% polys. Blood Ctx were negative. She had 3 days of IV Rocephin with IV albumin on D1 and D3 as well as 7 days of Augmentin.  She also had a 2.7L paracentesis. Stool studies were neg (C diff and immunocompromised host panel). She also had a CT scan which was unremarkable for acute event- patent PV. She denies any worsening jaundice, pruritis, SOB, CP, increased abdominal girth, LE edema, melena, BRBPR, confusion, depression, nausea, vomiting,  or diarrhea. Her abdominal pain has resolved and her ascites is well controlled on aldactone  and lasix  daily. She is following a low Na diet.      Cirrhosis Care:    1.HCC screen: MRI 09/06/13 and CT scan 12/13/13 cirrhosis, ascites, marked splenomegaly, no masses, biliary stricturing   2.Varicies surveillance: EGD 07/17/13 Grade II esophageal varices with EVL X 2. Missed follow-up.   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    -- Liver biopsy 08/03/01 minute fragments bile duct proliferation and periductular fibrosis; hepatitis G1-2, S2; ERCP 09/23/01 Subtle changes/narrowing but no dominant strictures; MRCP images 10/08 showed focal narrowing in right hepatic system; serologies ANA neg ASMA neg, antiLKMI neg AMA neg IgG normal (low level + ASMA 1:20 in 2003)    -- Occult GI bleeding with many EGD's, VCE 11/09 normal, Colonoscopy 7/06 and ?2/10 with biopsies negative for IBD including TI.    -- seasonal allergies    -- s/p tonsilectomy    -- Immune to HAV and HBV (2007)    -- EBV IgG pos CMV IgG and IgM neg       Social History:    Lives near [place], [place] with parents sister and brother. Melanie Conley is the middle child (she is adopted).   Her Mom teaches Ambulance person.    No tobacco or alcohol.    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   Pets: 1 dog and 1 cat        Review of Systems: Pertinent positive and negative review of systems were noted in the above HPI section.  All other review of systems was otherwise negative.Marland Kitchen  Physical Exam: Vital signs in last 24 hours: Temp:  [97.7 F (36.5 C)] 97.7 F (36.5 C)  (09/11 0247) Pulse Rate:  [83-109] 83 (09/11 0800) Resp:  [12-32] 15 (09/11 0630) BP: (92-123)/(37-69) 106/64 mmHg (09/11 0800) SpO2:  [93 %-100 %] 99 % (09/11 0800) Weight:  [99 lb 3.3 oz (45 kg)] 99 lb 3.3 oz (45 kg) (09/11 0800)   General:   Alert,  Well-developed,acutely ill appearing  Young AA female  Moaning in pain Head:  Normocephalic and atraumatic. Eyes:  Sclera +icterus.   Conjunctiva pink. Ears:  Normal auditory acuity. Nose:  No deformity, discharge,  or lesions. Mouth:  No deformity or lesions.   Neck:  Supple; no masses or thyromegaly. Lungs:  Clear throughout to auscultation.   No wheezes, crackles, or rhonchi. Heart:  Regular rate and rhythm; no murmurs, clicks, rubs,  or gallops. Abdomen: tense ascites diffusely tender to light palpation + guarding and rebound, Bs+  Rectal:  Deferred  Msk:  Symmetrical without gross deformities. . Pulses:  Normal pulses noted. Extremities:  Without clubbing or edema. Neurologic:  Alert and  oriented x4;  grossly normal neurologically, no asterixis Skin:  Intact without significant lesions or rashes.Marland Kitchen  Psych:  Alert and cooperative. Normal mood and affect.  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 Patient is cared for at [facility] hepatology (Dr. Jeremy Johann spoke with [facility] hepatology and she is not listed and will not be listed for  liver transplantation due to psychosocial issues) Presents today with 2 day history of progressively severe rather diffuse abdominal pain and tense ascites-will need to rule out SBP, and other etiologies of acute abdomen. Limited upper abdominal ultrasound shows no evidence of cholecystitis.? PVT Current MELD 25 #2 patient immunosuppressed #3 history of esophageal and gastric varices with acute hemorrhage secondary to gastric varix August 2016 #4 insulin-dependent diabetes mellitus #5 hx of hepatic encephalopathy  PLAN: Aggressive, supportive care with IV antibiotics.  [facility] hepatology has accepted her in transfer but as she is not a transplant candidate they will not have much more to offer her there than can be provided here.  Agree with palliative care consultation.  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  She is very ill and in significant abdominal pain.  Preliminary review of paracentesis shows no obvious infection however I do think she should stay on IV abx for now.  Her abd is very tender, possibly just from the tense ascites.  Her renal function is acutely worse.  Her MELD is 25, thus mortality high from this acute downturn.  For now, IV abx, pain meds, palliative care consultation.  If Cr is not significantly worse tomorrow (and perhaps even if it is worse) then I recommend repeat paracentesis with larger  volume removed (4-5 liters) for palliation.  Not sure that non IV contrast imaging will change management at this point.     Rob Bunting, MD [facility] Gastroenterology Pager [phone]

## 2014-10-09 NOTE — Procedures (Signed)
Successful US guided paracentesis from RLQ.  Yielded 500 mls of clear yellow fluid. (patient had 500 ml limit ordered) No immediate complications.  Pt tolerated well.   Specimen was sent for labs.  Dhilan Brauer S Donnavan Covault PA-C 10/09/2014 11:50 AM

## 2014-10-09 NOTE — ED Notes (Signed)
Pt arrives from home via EMS with c/o severe abdominal distension. Hx liver disease, paracentesis, states she gets one done once a month. Pt reports abdominal pain and emesis.

## 2014-10-09 NOTE — Progress Notes (Signed)
UR COMPLETED  

## 2014-10-09 NOTE — Discharge Summary (Signed)
PATIENT DETAILS Name: Melanie Conley Age: 24 y.o. Sex: female Date of Birth: 1990/11/16 MRN: [id number]. Admitting Physician: Eston Esters, MD WUJ:[id number],WGNF, MD  Admit Date: 10/09/2014 Discharge date: 10/09/2014  Recommendations for Outpatient Follow-up:  1. Follow ascites culture 2. Not a transplant candidate-will need palliative care services   PRIMARY DISCHARGE DIAGNOSIS:  Principal Problem:   Abdominal pain, acute Active Problems:   Abdominal pain      PAST MEDICAL HISTORY: Past Medical History  Diagnosis Date  . Anemia   . Esophageal varices   . Autoimmune hepatitis   . GIB (gastrointestinal bleeding)   . Cirrhosis   . Primary sclerosing cholangitis   . Irregular periods 08/04/2014  . Vaginal Pap smear, abnormal     DISCHARGE MEDICATIONS: Current Discharge Medication List    START taking these medications   Details  cefoTAXime in dextrose 5 % 50 mL 2 g, Intravenous, at 100 mL/hr, Every 8 hours    HYDROmorphone (DILAUDID) 1 MG/ML injection Inject 0.5-1 mLs (0.5-1 mg total) into the vein every 4 (four) hours as needed for severe pain. Qty: 1 mL, Refills: 0    pantoprazole (PROTONIX) 40 MG injection Inject 40 mg into the vein every 12 (twelve) hours.    predniSONE (DELTASONE) 5 MG tablet Take 1 tablet (5 mg total) by mouth daily with breakfast.    ursodiol (ACTIGALL) 300 MG capsule Take 1 capsule (300 mg total) by mouth 2 (two) times daily.    valGANciclovir (VALCYTE) 450 MG tablet Take 2 tablets (900 mg total) by mouth 2 (two) times daily.      CONTINUE these medications which have CHANGED   Details  azaTHIOprine (IMURAN) 50 MG tablet Take 0.5 tablets (25 mg total) by mouth daily.    insulin aspart (NOVOLOG) 100 UNIT/ML injection 0-9 Units, Subcutaneous, 3 times daily with meals CBG < 70: implement hypoglycemia protocol CBG 70 - 120: 0 units CBG 121 - 150: 1 unit CBG 151 - 200: 2 units CBG 201 - 250: 3 units CBG 251 - 300: 5 units CBG 301 - 350:  7 units CBG 351 - 400: 9 units CBG > 400: call MD      CONTINUE these medications which have NOT CHANGED   Details  albuterol (PROVENTIL) (2.5 MG/3ML) 0.083% nebulizer solution Take 2.5 mg by nebulization every 4 (four) hours as needed for wheezing or shortness of breath.     lactulose (CHRONULAC) 10 GM/15ML solution Take 30 mLs (20 g total) by mouth 2 (two) times daily. Qty: 240 mL, Refills: 0    rifaximin (XIFAXAN) 550 MG TABS tablet Take 1 tablet (550 mg total) by mouth 2 (two) times daily. Qty: 60 tablet      STOP taking these medications     furosemide (LASIX) 20 MG tablet      hydrocortisone sodium succinate (SOLU-CORTEF) 100 MG SOLR injection      metroNIDAZOLE (FLAGYL) 500 MG tablet      octreotide (SANDOSTATIN) 2 mcg/mL SOLN      octreotide 500 mcg in sodium chloride 0.9 % 250 mL      ondansetron (ZOFRAN) 4 MG tablet      pantoprazole 80 mg in sodium chloride 0.9 % 250 mL      sodium chloride 0.9 % infusion         ALLERGIES:  No Known Allergies  BRIEF HPI:  See H&P, Labs, Consult and Test reports for all details in brief, patient was admitted for evaluation of abdominal pain. Patient was  found to have significantly elevated LFTs and acute renal failure.  CONSULTATIONS:   GI and Palliative care  PERTINENT RADIOLOGIC STUDIES: Ct Head Wo Contrast  09/11/2014   CLINICAL DATA:  Lethargy and confusion.  EXAM: CT HEAD WITHOUT CONTRAST  TECHNIQUE: Contiguous axial images were obtained from the base of the skull through the vertex without intravenous contrast.  COMPARISON:  None.  FINDINGS: The brain demonstrates no evidence of hemorrhage, infarction, edema, mass effect, extra-axial fluid collection, hydrocephalus or mass lesion. The skull is unremarkable.  IMPRESSION: Normal head CT.   Electronically Signed   By: Irish Lack M.D.   On: 09/11/2014 08:15   US Renal  10/09/2014   CLINICAL DATA:  Renal failure.  EXAM: RENAL / URINARY TRACT ULTRASOUND COMPLETE   COMPARISON:  None.  FINDINGS: Right Kidney:  Length: 10.2 cm. Echogenicity within normal limits. No mass or hydronephrosis visualized.  Left Kidney:  Length: 10.5 cm. Echogenicity within normal limits. No mass or hydronephrosis visualized.  Bladder:  Appears normal for degree of bladder distention.  IMPRESSION: No evidence for obstructive uropathy.   Electronically Signed   By: Elsie Stain M.D.   On: 10/09/2014 10:15   US Paracentesis  10/09/2014   CLINICAL DATA:  Primary sclerosing cholangitis, autoimmune hepatitis, recurrent ascites. Request is made for diagnostic only paracentesis with a 500 milliliter limit.  EXAM: ULTRASOUND GUIDED RIGHT LOWER QUADRANT PARACENTESIS  COMPARISON:  None.  PROCEDURE: An ultrasound guided paracentesis was thoroughly discussed with the patient and questions answered. The benefits, risks, alternatives and complications were also discussed. The patient understands and wishes to proceed with the procedure. Written consent was obtained.  Ultrasound was performed to localize and mark an adequate pocket of fluid in the right quadrant of the abdomen. The area was then prepped and draped in the normal sterile fashion. 1% Lidocaine was used for local anesthesia. Under ultrasound guidance a 19 gauge Yueh catheter was introduced. Paracentesis was performed. The catheter was removed and a dressing applied.  COMPLICATIONS: None.  FINDINGS: A total of approximately 500 mls of clear yellow fluid was removed. A fluid sample was sent for laboratory analysis.  IMPRESSION: Successful ultrasound guided paracentesis yielding 500 mls of ascites.  Read by:  Corrin Parker, PA-C   Electronically Signed   By: Gilmer Mor D.O.   On: 10/09/2014 11:49   US Paracentesis  09/23/2014   INDICATION: Primary sclerosing cholangitis, autoimmune hepatitis, recurrent ascites. Request is made for diagnostic and therapeutic paracentesis.  EXAM: ULTRASOUND-GUIDED DIAGNOSTIC AND THERAPEUTIC PARACENTESIS  COMPARISON:   Prior paracentesis on 09/05/2014  MEDICATIONS: None.  COMPLICATIONS: None immediate  TECHNIQUE: Informed written consent was obtained from the patient after a discussion of the risks, benefits and alternatives to treatment. A timeout was performed prior to the initiation of the procedure.  Initial ultrasound scanning demonstrates a moderate amount of ascites within the right lower abdominal quadrant. The right lower abdomen was prepped and draped in the usual sterile fashion. 1% lidocaine was used for local anesthesia. Under direct ultrasound guidance, a 19 gauge, 10-cm, Yueh catheter was introduced. An ultrasound image was saved for documentation purposed. The paracentesis was performed. The catheter was removed and a dressing was applied. The patient tolerated the procedure well without immediate post procedural complication.  FINDINGS: A total of approximately 3 liters of slightly turbid, yellow fluid was removed. Samples were sent to the laboratory as requested by the clinical team.  IMPRESSION: Successful ultrasound-guided diagnostic and therapeutic paracentesis yielding 3 liters of peritoneal fluid.  The patient will receive IV albumin infusion postprocedure.  Read by: Jeananne Rama, PA-C   Electronically Signed   By: Gilmer Mor D.O.   On: 09/22/2014 20:12   Dg Chest Port 1 View  09/12/2014   CLINICAL DATA:  Central line placement.  EXAM: PORTABLE CHEST - 1 VIEW  COMPARISON:  09/12/2014 at 5:07 a.m.  FINDINGS: Interval removal of endotracheal tube. Right IJ central venous catheter has been pulled back and tip over the SVC below the level of the head of the right clavicle and 2.5 cm above the carina.  Lungs are hypoinflated and otherwise clear. Cardiomediastinal silhouette and remainder of the exam is unchanged.  IMPRESSION: Hypoinflation without acute cardiopulmonary disease.  Right IJ central venous catheter has been pulled back with tip over the SVC just below the head of the right clavicle.  These  results were called by telephone at the time of interpretation on 09/12/2014 at 9:09 pm to patient's nurse, May Aninon, who verbally acknowledged these results.   Electronically Signed   By: Elberta Fortis M.D.   On: 09/12/2014 21:10   Dg Chest Port 1 View  09/12/2014   CLINICAL DATA:  History of bleeding gastric varix. Status post band ligation 09/11/2014. Intubated patient.  EXAM: PORTABLE CHEST - 1 VIEW  COMPARISON:  Single view of the chest 09/11/2014.  FINDINGS: Endotracheal tube remains in place with the tip in good position at the level of the clavicular heads. Right IJ catheter tip projects in the right atrium. The line should be withdrawn approximately 2.5 cm for better positioning. Lung volumes are low but the lungs are clear. No pneumothorax or pleural effusion. Heart size is normal. Gaseous distention of the stomach seen on yesterday's study has resolved.  IMPRESSION: Endotracheal tube in good position.  Right IJ catheter tip projects in the right atrium. Recommend withdrawal of 2.5 cm.  Left lower lobe airspace disease seen on yesterday's examination has cleared.   Electronically Signed   By: Drusilla Kanner M.D.   On: 09/12/2014 07:30   Dg Chest Port 1 View  09/11/2014   CLINICAL DATA:  24 year old female with sepsis. Intubated and central line placement. Initial encounter.  EXAM: PORTABLE CHEST - 1 VIEW  COMPARISON:  0955 hours today, and earlier  FINDINGS: Portable AP semi upright view at 1219 hours. Endotracheal tube tip is at the carina and directed slightly toward the right mainstem bronchus. Right IJ central line is been placed, tip projects at the lower SVC level.  No pneumothorax. Left lower lobe collapse or consolidation is new. No pulmonary edema or pleural effusion. Moderate to severe gaseous distension of the stomach is new. Normal cardiac size and mediastinal contours.  IMPRESSION: 1. Intubated, endotracheal tube tip at the carina or just inside the right mainstem bronchus. Retract 3  cm for placement at the level of the clavicles. 2. Left lower lobe collapse/consolidation. Moderate to severe gaseous distension of the stomach. 3. Right IJ central line placed, tip at the lower SVC level. No pneumothorax. Study discussed by telephone with 2 Heart RN Delphia Grates on 09/11/2014 at 1316 hrs.   Electronically Signed   By: Odessa Fleming M.D.   On: 09/11/2014 13:20   Dg Chest Port 1 View  09/11/2014   CLINICAL DATA:  24 year old female with sepsis. Initial encounter.  EXAM: PORTABLE CHEST - 1 VIEW  COMPARISON:  0631 hours today, and earlier  FINDINGS: Portable AP semi upright view at 0955 hours. Continued low lung volumes. Allowing for portable  technique, the lungs are clear. No pneumothorax or pneumoperitoneum. Normal cardiac size and mediastinal contours.  IMPRESSION: No acute cardiopulmonary abnormality.   Electronically Signed   By: Odessa Fleming M.D.   On: 09/11/2014 10:33   Dg Chest Port 1 View  09/11/2014   CLINICAL DATA:  Shortness of breath.  EXAM: PORTABLE CHEST - 1 VIEW  COMPARISON:  August 11, 2014.  FINDINGS: The heart size and mediastinal contours are within normal limits. Both lungs are clear. No pneumothorax or pleural effusion is noted. The visualized skeletal structures are unremarkable.  IMPRESSION: No acute cardiopulmonary abnormality seen.   Electronically Signed   By: Lupita Raider, M.D.   On: 09/11/2014 07:20   Dg Abd 2 Views  10/09/2014   CLINICAL DATA:  Epigastric abdominal pain, vomiting, patient reports history of liver disease  EXAM: ABDOMEN - 2 VIEW  COMPARISON:  CT 09/05/2014  FINDINGS: The bowel gas pattern is normal. There is no evidence of free air. No radio-opaque calculi or other significant radiographic abnormality is seen. Mild stool burden. Central displacement of bowel loops likely reflects ascites.  IMPRESSION: Normal bowel gas pattern but central displacement of bowel loops may indicate ascites.   Electronically Signed   By: Christiana Pellant M.D.   On: 10/09/2014  09:28   US Abdomen Limited Ruq  10/09/2014   CLINICAL DATA:  Primary biliary cirrhosis.  Abdominal pain  EXAM: US ABDOMEN LIMITED - RIGHT UPPER QUADRANT  COMPARISON:  None.  FINDINGS: Gallbladder:  No gallstones or wall thickening visualized. No sonographic Murphy sign noted.  Common bile duct:  Diameter: 3.8 mm.  Liver:  The liver appears atrophic and cirrhotic. No focal lesion identified.  Other:  Marked abdominal ascites.  IMPRESSION: 1. No acute findings. 2. Cirrhosis 3. Ascites.   Electronically Signed   By: Signa Kell M.D.   On: 10/09/2014 10:10     PERTINENT LAB RESULTS: CBC:  Recent Labs  10/09/14 0245 10/09/14 0901  WBC 6.6 8.8  HGB 8.8* 7.8*  HCT 26.4* 23.5*  PLT 238 245   CMET CMP     Component Value Date/Time   NA 130* 10/09/2014 0901   K 3.0* 10/09/2014 0901   CL 91* 10/09/2014 0901   CO2 29 10/09/2014 0901   GLUCOSE 106* 10/09/2014 0901   BUN 28* 10/09/2014 0901   CREATININE 1.98* 10/09/2014 0901   CALCIUM 8.4* 10/09/2014 0901   PROT 5.6* 10/09/2014 0901   ALBUMIN 2.5* 10/09/2014 0901   AST 36 10/09/2014 0901   ALT 23 10/09/2014 0901   ALKPHOS 374* 10/09/2014 0901   BILITOT 10.5* 10/09/2014 0901   GFRNONAA 34* 10/09/2014 0901   GFRAA 40* 10/09/2014 0901    GFR Estimated Creatinine Clearance: 30.1 mL/min (by C-G formula based on Cr of 1.98).  Recent Labs  10/09/14 0245  LIPASE 55*   No results for input(s): CKTOTAL, CKMB, CKMBINDEX, TROPONINI in the last 72 hours. Invalid input(s): POCBNP No results for input(s): DDIMER in the last 72 hours. No results for input(s): HGBA1C in the last 72 hours. No results for input(s): CHOL, HDL, LDLCALC, TRIG, CHOLHDL, LDLDIRECT in the last 72 hours. No results for input(s): TSH, T4TOTAL, T3FREE, THYROIDAB in the last 72 hours.  Invalid input(s): FREET3 No results for input(s): VITAMINB12, FOLATE, FERRITIN, TIBC, IRON, RETICCTPCT in the last 72 hours. Coags:  Recent Labs  10/09/14 0901  INR 1.38    Microbiology: Recent Results (from the past 240 hour(s))  MRSA PCR Screening  Status: None   Collection Time: 10/09/14 10:13 AM  Result Value Ref Range Status   MRSA by PCR NEGATIVE NEGATIVE Final    Comment:        The GeneXpert MRSA Assay (FDA approved for NASAL specimens only), is one component of a comprehensive MRSA colonization surveillance program. It is not intended to diagnose MRSA infection nor to guide or monitor treatment for MRSA infections.   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 Found to have a creatinine of 1.99 and a total bilirubin of 11.9 on admission. Diagnostic paracentesis not suggestive of SBP. Hepatology at [facility] consulted over the phone, patient no longer active on the transplant list, and even with acute issues, not a candidate to be placed back on the transplant list. Diuretics currently on hold, gentle hydration in process, plans are for supportive care and close clinical monitoring. Palliative care also consulted-recommendations are pending. Awaiting bed at [facility] to transfer  Hospital course by problem list: Acute abdominal pain: Initially suspicion for SBP-however ascitic fluid not consistent with SBP, not sure if abdominal pain is secondary to tense ascites or has increased clot burden in mesenteric/portal vein. Lactate normal. GI recommending supportive measures, and to repeat further imaging depending on renal  function in the next few days.  Acute renal failure: Either prerenal azotemia or developing hepatorenal syndrome. Diuretics held, being cautiously hydrated, supported with albumin. Renal ultrasound negative for hydronephrosis. Awaiting urine sodium, repeat electrolytes on a daily basis till stable.   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 GI consulted during this hospital stay. Also spoke with [facility] hepatology on-call Dr. Ian Malkin on 9/11-case discussed, patient accepted but no beds available for immediate transfer. Furthermore, per Dr Zach-patient no longer active on the transplant list-even with current acute issues will not be placed on a transplant list given numerous social issues outlined in discharge summary and notes in care everywhere. Significantly elevated MELD score of 25-with acute illness-Palliative care consulted. Remains a full code.   History of SBP: Will likely need prophylactic therapy at some point, remains on IV antibiotics currently  History of hepatic encephalopathy: Continue with lactulose and rifaximin. Although lethargic, awake and alert.  History of portal vein thrombosis: Recently diagnosed in [facility]-not a candidate for anticoagulation per discharge summary from [facility].  History of variceal bleeding: Reviewed last discharge summary from [facility]-patient most recently had an endoscopy with banding on 8/14 (here at [facility])-unfortunately, she was diagnosed with a portal vein thrombosis-and could not proceed with BRTO.No hx of melena or hematemesis. Start PPI. Resume her beta blocker when more stable.  Anemia: Likely secondary to critical illness, no evidence of invasive bleeding at present. Follow closely. Type and screen.  History of CMV viremia: Continue with valganciclovir for prophylaxis.  Recent history of C. difficile  colitis: Recently completed a course of vancomycin. No evidence  of diarrhea at present.  TODAY-DAY OF DISCHARGE:  Subjective:   Melanie Conley today continues to have abdominal pain.  Objective:   Blood pressure 101/61, pulse 86, temperature 97.8 F (36.6 C), temperature source Oral, resp. rate 8, height 4\' 11"  (1.499 m), weight 50.6 kg (111 lb 8.8 oz), SpO2 94 %. No intake or output data in the 24 hours ending 10/09/14 1552 Filed Weights   10/09/14 0800 10/09/14 0900  Weight: 45 kg (99 lb 3.3 oz) 50.6 kg (111 lb 8.8 oz)    Exam Awake Alert, No new F.N deficits [place].AT,PERRAL Supple Neck,No JVD, No cervical lymphadenopathy appriciated.  Symmetrical Chest wall movement, Good air movement bilaterally, CTAB RRR,No Gallops,Rubs or new Murmurs, No Parasternal Heave +ve B.Sounds, Abd Soft, Diffusely tender, No organomegaly appriciated No Cyanosis, Clubbing or edema, No new Rash or bruise  DISCHARGE CONDITION: Stable  DISPOSITION: Transfer to [facility] when bed available  DISCHARGE INSTRUCTIONS:    Activity:  As tolerated with Full fall precautions use walker/cane & assistance as needed  Get Medicines reviewed and adjusted: Please take all your medications with you for your next visit with your Primary MD  Please request your Primary MD to go over all hospital tests and procedure/radiological results at the follow up, please ask your Primary MD to get all Hospital records sent to his/her office.  If you experience worsening of your admission symptoms, develop shortness of breath, life threatening emergency, suicidal or homicidal thoughts you must seek medical attention immediately by calling 911 or calling your MD immediately  if symptoms less severe.  You must read complete instructions/literature along with all the possible adverse reactions/side effects for all the Medicines you take and that have been prescribed to you. Take any new Medicines after you have completely understood and  accpet all the possible adverse reactions/side effects.   Do not drive when taking Pain medications.   Do not take more than prescribed Pain, Sleep and Anxiety Medications  Special Instructions: If you have smoked or chewed Tobacco  in the last 2 yrs please stop smoking, stop any regular Alcohol  and or any Recreational drug use.  Wear Seat belts while driving.  Please note  You were cared for by a hospitalist during your hospital stay. Once you are discharged, your primary care physician will handle any further medical issues. Please note that NO REFILLS for any discharge medications will be authorized once you are discharged, as it is imperative that you return to your primary care physician (or establish a relationship with a primary care physician if you do not have one) for your aftercare needs so that they can reassess your need for medications and monitor your lab values.   Diet recommendation: NPO  Follow-up Information    Follow up with [name],[name], MD. Schedule an appointment as soon as possible for a visit in 1 week.   Specialty:  Internal Medicine      Total Time spent on discharge equals 45 minutes.  SignedJeoffrey Massed 10/09/2014 3:52 PM

## 2014-10-09 NOTE — ED Provider Notes (Signed)
CSN: [id number]     Arrival date & time 10/09/14  0157 History   First MD Initiated Contact with Patient 10/09/14 0410     Chief Complaint  Patient presents with  . Abdominal Pain     (Consider location/radiation/quality/duration/timing/severity/associated sxs/prior Treatment) HPI Comments: Patient is a 24 year old female with history of primary sclerosing cholangitis. She has episodes of recurrent ascites requiring paracentesis. She presents today with complaints of increasing abdominal distention and pain. She reports decreased by mouth intake for the past several days. She denies any fevers or chills.  She is on a liver transplant list at [facility] and followed by the GI doctors there.  Patient is a 24 y.o. female presenting with abdominal pain. The history is provided by the patient.  Abdominal Pain Pain location:  Generalized Pain quality: cramping   Pain radiates to:  Does not radiate Pain severity:  Severe Onset quality:  Gradual Duration:  1 week Timing:  Constant Progression:  Worsening Chronicity:  Recurrent Relieved by:  Nothing Worsened by:  Nothing tried Ineffective treatments:  None tried   Past Medical History  Diagnosis Date  . Anemia   . Esophageal varices   . Autoimmune hepatitis   . GIB (gastrointestinal bleeding)   . Cirrhosis   . Primary sclerosing cholangitis   . Irregular periods 08/04/2014  . Vaginal Pap smear, abnormal    Past Surgical History  Procedure Laterality Date  . Gastric banding port revision    . Tonsillectomy    . Esophagogastroduodenoscopy  07/22/2011    Procedure: ESOPHAGOGASTRODUODENOSCOPY (EGD);  Surgeon: Graylin Shiver, MD;  Location: [facility] ENDOSCOPY;  Service: Endoscopy;  Laterality: N/A;  . Esophagogastroduodenoscopy N/A 09/11/2014    Procedure: ESOPHAGOGASTRODUODENOSCOPY (EGD);  Surgeon: Charlott Rakes, MD;  Location: [facility] ENDOSCOPY;  Service: Endoscopy;  Laterality: N/A;   Family History  Problem Relation Age of Onset  . Adopted: Yes    Social History  Substance Use Topics  . Smoking status: Former Smoker -- 0.00 packs/day for 0 years  . Smokeless tobacco: Never Used  . Alcohol Use: No   OB History    Gravida Para Term Preterm AB TAB SAB Ectopic Multiple Living   0 0 0 0 0 0 0 0 0 0      Review of Systems  Gastrointestinal: Positive for abdominal pain.  All other systems reviewed and are negative.     Allergies  Review of patient's allergies indicates no known allergies.  Home Medications   Prior to Admission medications   Medication Sig Start Date End Date Taking? Authorizing Provider  albuterol (PROVENTIL) (2.5 MG/3ML) 0.083% nebulizer solution Take 2.5 mg by nebulization every 4 (four) hours as needed for wheezing or shortness of breath.  07/14/14   Historical Provider, MD  azaTHIOprine (IMURAN) 50 MG tablet Take 50 mg by mouth daily. 06/21/14   Historical Provider, MD  furosemide (LASIX) 20 MG tablet Take 1 tablet (20 mg total) by mouth daily. 09/13/14   Vilinda Blanks Minor, NP  hydrocortisone sodium succinate (SOLU-CORTEF) 100 MG SOLR injection Inject 1 mL (50 mg total) into the vein every 6 (six) hours. 09/13/14   Vilinda Blanks Minor, NP  insulin aspart (NOVOLOG) 100 UNIT/ML injection Inject 1-3 Units into the skin every 4 (four) hours. 09/13/14   Vilinda Blanks Minor, NP  lactulose (CHRONULAC) 10 GM/15ML solution Take 30 mLs (20 g total) by mouth 2 (two) times daily. 09/13/14   Vilinda Blanks Minor, NP  metroNIDAZOLE (FLAGYL) 500 MG tablet Take 1 tablet (500 mg  total) by mouth 3 (three) times daily. 09/06/14   Hollice Espy, MD  octreotide (SANDOSTATIN) 2 mcg/mL SOLN Inject 100 mcg into the vein once. 09/13/14   Vilinda Blanks Minor, NP  octreotide 500 mcg in sodium chloride 0.9 % 250 mL Inject 50 mcg/hr into the vein continuous. 09/13/14   Vilinda Blanks Minor, NP  ondansetron (ZOFRAN) 4 MG tablet Take 1 tablet (4 mg total) by mouth every 6 (six) hours as needed for nausea. 09/13/14   Vilinda Blanks Minor, NP  pantoprazole 80 mg in sodium  chloride 0.9 % 250 mL Inject 8 mg/hr into the vein continuous. 09/13/14   Vilinda Blanks Minor, NP  rifaximin (XIFAXAN) 550 MG TABS tablet Take 1 tablet (550 mg total) by mouth 2 (two) times daily. 09/13/14   Vilinda Blanks Minor, NP  sodium chloride 0.9 % infusion Inject 10 mLs into the vein continuous. 09/13/14   Vilinda Blanks Minor, NP   BP 93/49 mmHg  Pulse 102  Temp(Src) 97.7 F (36.5 C) (Oral)  Resp 21  SpO2 96% Physical Exam  Constitutional: She is oriented to person, place, and time. She appears well-developed. No distress.  Patient is a chronically ill-appearing 24 year old female. She appears uncomfortable.  HENT:  Head: Normocephalic and atraumatic.  Neck: Normal range of motion. Neck supple.  Cardiovascular: Normal rate and regular rhythm.  Exam reveals no gallop and no friction rub.   No murmur heard. Pulmonary/Chest: Effort normal and breath sounds normal. No respiratory distress. She has no wheezes.  Abdominal: She exhibits distension. There is no tenderness. There is no rebound and no guarding.  Abdomen is distended with a fluid wave present. There is tenderness to palpation in all 4 quadrants.  Musculoskeletal: Normal range of motion.  Neurological: She is alert and oriented to person, place, and time.  Skin: Skin is warm and dry.  Nursing note and vitals reviewed.   ED Course  Procedures (including critical care time) Labs Review Labs Reviewed  LIPASE, BLOOD - Abnormal; Notable for the following:    Lipase 55 (*)    All other components within normal limits  COMPREHENSIVE METABOLIC PANEL - Abnormal; Notable for the following:    Sodium 133 (*)    Potassium 2.2 (*)    Chloride 92 (*)    BUN 28 (*)    Creatinine, Ser 1.99 (*)    Calcium 8.8 (*)    Total Protein 6.4 (*)    Albumin 2.9 (*)    Alkaline Phosphatase 439 (*)    Total Bilirubin 11.9 (*)    GFR calc non Af Amer 34 (*)    GFR calc Af Amer 40 (*)    All other components within normal limits  URINALYSIS, ROUTINE W  REFLEX MICROSCOPIC (NOT AT [facility]) - Abnormal; Notable for the following:    Color, Urine AMBER (*)    APPearance CLOUDY (*)    Bilirubin Urine MODERATE (*)    Leukocytes, UA TRACE (*)    All other components within normal limits  CBC WITH DIFFERENTIAL/PLATELET - Abnormal; Notable for the following:    RBC 2.70 (*)    Hemoglobin 8.8 (*)    HCT 26.4 (*)    RDW 23.4 (*)    Neutrophils Relative % 82 (*)    All other components within normal limits  URINE MICROSCOPIC-ADD ON - Abnormal; Notable for the following:    Squamous Epithelial / LPF FEW (*)    All other components within normal limits  POC URINE PREG, ED  Imaging Review No results found. I have personally reviewed and evaluated these images and lab results as part of my medical decision-making.   EKG Interpretation None      MDM   Final diagnoses:  None    Patient with primaries sclerosing cholangitis. She presents with increased abdominal distention, pain, and generalized malaise. Her workup reveals a potassium with 2.2 and her abdominal exam reveals distention and fluid wave. I have ordered IV potassium replacement and have consult internal medicine for admission. She will likely require a paracentesis while in the hospital.    Geoffery Lyons, MD 10/09/14 [phone]

## 2014-10-09 NOTE — Consult Note (Signed)
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 Found to have a creatinine of 1.99 and a total bilirubin of 11.9 on admission. Diagnostic paracentesis not suggestive of SBP. Hepatology at [facility] consulted over the phone, patient no longer active on the transplant list, and even with acute issues, not a candidate to be placed back on the transplant list. Diuretics currently on hold, gentle hydration in process, plans are for supportive care and close clinical monitoring.   Palliative care consult requested, the patient is at present awaiting bed availability at [facility] for transfer. She is resting in bed, appears acutely ill, has abdominal pain. Patient on IV Dilaudid PRN. Call placed to patient's mother Melanie Conley at [phone]. She is unable to be reached.   If the patient is still hospitalized at [facility], palliative will follow up with her and attempt to schedule a family meeting with the patient's mother on 10-10-14 for further discussions. Agree that the patient ought to be transferred to [facility] for directing further care, she remains with multiple serious life limiting illnesses, would also advise palliative follow up at [facility] for code status and goals of care discussions.    Contacts/Participants in Discussion: Primary Decision Maker:  Patient, mother Melanie Conley [phone]    HCPOA:  yes, patient's mother Melanie Conley      Code Status/Advance Care Planning:  Full code   Symptom Management:    patient has Dilaudid IV PRN for pain   Palliative Prophylaxis:  Yes   Additional Recommendations (Limitations, Scope, Preferences):    Psycho-social/Spiritual:   Support System:    Desire for further Chaplaincy support:no  Prognosis: Unable to determine  Discharge Planning:  Likely transfer to [facility] when bed available.    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       Chief Complaint/History of Present Illness: worsening abdominal pain   Primary Diagnoses  Present on Admission:  . Abdominal pain, acute . Abdominal pain  Palliative Review of Systems: Attempted but unable to review fully as patient has abdominal pain  I have reviewed the medical record, interviewed the patient and family, and examined the patient. The following aspects are pertinent.  Past Medical History  Diagnosis Date  . Anemia   . Esophageal varices   . Autoimmune hepatitis   . GIB (gastrointestinal bleeding)   . Cirrhosis   . Primary sclerosing cholangitis   . Irregular periods 08/04/2014  . Vaginal Pap smear, abnormal    Social History   Social History  . Marital Status: Single    Spouse Name: N/A  . Number of Children: N/A  . Years of Education: N/A   Social History Main Topics  . Smoking status: Former Smoker -- 0.00 packs/day for 0 years  . Smokeless tobacco: Never Used  . Alcohol Use:  No  . Drug Use: No  . Sexual Activity: Yes    Birth Control/ Protection: None   Other Topics Concern  . 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       History of Present Illness: Patient is a pleasant 24 year old [place] American female  who was diagnosed with liver disease at age 74 when she presented with a GI bleed. She has undergone liver biopsy and ERCP in 2003. 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 Her complications of cirrhosis include bleeding esophageal varicies, ascites, SBP and encephalopathy. She was listed for OLT at [facility] in 2006 but made status 7 due to a low MELD score and a good quality of life. We have been unable to reactivate her as an adult because she has not passed the psychosocial evaluation. She has undergone formal neuropsych testing. In the past, she has stated that she does not wish to undergo OLT even if she needs it. In regards to her AIH, her serologies are negative and she has been on Imuran and prednisone since her diagnosis in 2003. Regarding her PSC, she has had one bout of cholangitis years ago per report. She denies a history of stenting or dominant biliary stricture or choledolcholithiasis. She was previously on high dose ursodiol.       She presents today with her Mom. I last saw her in Feb 2015. She has been hospitalized here at [facility] for abdominal pain/worsening ascites in 11/2013 and the again at [facility] in 12/2013 for SBP despite being on Levoquin prophylaxis. Her outside records from 12/2013 were reviewed in Care Everywhere. Ascites fluid showed 7866 nuc cells with 83% polys. Blood Ctx were negative. She had 3 days of IV Rocephin with IV albumin on D1 and D3 as well as 7 days of Augmentin. She also had a 2.7L paracentesis. Stool studies were neg (C diff and immunocompromised host panel). She also had a CT scan which was unremarkable for acute event- patent PV. She denies any worsening jaundice, pruritis, SOB, CP, increased abdominal girth, LE edema, melena, BRBPR, confusion, depression, nausea, vomiting, or diarrhea. Her abdominal pain has resolved and her ascites is well controlled on aldactone  and lasix  daily. She is following a low Na diet.      Cirrhosis Care:    1.HCC  screen: MRI 09/06/13 and CT scan 12/13/13 cirrhosis, ascites, marked splenomegaly, no masses, biliary stricturing   2.Varicies surveillance: EGD 07/17/13 Grade II esophageal varices with EVL X 2. Missed follow-up.   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    -- Liver biopsy 08/03/01 minute fragments bile duct proliferation and periductular fibrosis; hepatitis G1-2, S2; ERCP 09/23/01 Subtle changes/narrowing but no dominant strictures; MRCP images 10/08 showed focal narrowing in right hepatic system; serologies ANA neg ASMA neg, antiLKMI neg AMA neg IgG normal (low level + ASMA 1:20 in 2003)    -- Occult GI bleeding with many EGD's, VCE 11/09 normal, Colonoscopy 7/06 and ?2/10 with biopsies negative for IBD including TI.    -- seasonal allergies    --  s/p tonsilectomy    -- Immune to HAV and HBV (2007)    -- EBV IgG pos CMV IgG and IgM neg       Social History:    Lives near [place], [place] with parents sister and brother. Haydyn is the middle child (she is adopted).   Her Mom teaches Ambulance person.    No tobacco or alcohol.    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   Pets: 1 dog and 1 cat       Family History  Problem Relation Age of Onset  . Adopted: Yes   Scheduled Meds: . azaTHIOprine  25 mg Oral Daily  . cefoTAXime (CLAFORAN) IV  2 g Intravenous 3 times per day  . insulin aspart  0-9 Units Subcutaneous TID WC  . lactulose  20 g Oral BID  . lidocaine (PF)      . pantoprazole (PROTONIX) IV  40 mg Intravenous Q12H  . potassium chloride  10 mEq Intravenous Q1 Hr x 3  . [START ON 10/10/2014] predniSONE  5 mg Oral Q breakfast    . rifaximin  550 mg Oral BID  . ursodiol  300 mg Oral BID  . valGANciclovir  900 mg Oral BID   Continuous Infusions: . sodium chloride 50 mL/hr at 10/09/14 1029   PRN Meds:.albuterol, HYDROmorphone (DILAUDID) injection Medications Prior to Admission:  Prior to Admission medications   Medication Sig Start Date End Date Taking? Authorizing Provider  albuterol (PROVENTIL) (2.5 MG/3ML) 0.083% nebulizer solution Take 2.5 mg by nebulization every 4 (four) hours as needed for wheezing or shortness of breath.  07/14/14   Historical Provider, MD  azaTHIOprine (IMURAN) 50 MG tablet Take 50 mg by mouth daily. 06/21/14   Historical Provider, MD  furosemide (LASIX) 20 MG tablet Take 1 tablet (20 mg total) by mouth daily. 09/13/14   Vilinda Blanks Minor, NP  hydrocortisone sodium succinate (SOLU-CORTEF) 100 MG SOLR injection Inject 1 mL (50 mg total) into the vein every 6 (six) hours. 09/13/14   Vilinda Blanks Minor, NP  insulin aspart (NOVOLOG) 100 UNIT/ML injection Inject 1-3 Units into the skin every 4 (four) hours. 09/13/14   Vilinda Blanks Minor, NP  lactulose (CHRONULAC) 10 GM/15ML solution Take 30 mLs (20 g total) by mouth 2 (two) times daily. 09/13/14   Vilinda Blanks Minor, NP  metroNIDAZOLE (FLAGYL) 500 MG tablet Take 1 tablet (500 mg total) by mouth 3 (three) times daily. 09/06/14   Hollice Espy, MD  octreotide (SANDOSTATIN) 2 mcg/mL SOLN Inject 100 mcg into the vein once. 09/13/14   Vilinda Blanks Minor, NP  octreotide 500 mcg in sodium chloride 0.9 % 250 mL Inject 50 mcg/hr into the vein continuous. 09/13/14   Vilinda Blanks Minor, NP  ondansetron (ZOFRAN) 4 MG tablet Take 1 tablet (4 mg total) by mouth every 6 (six) hours as needed for nausea. 09/13/14   Vilinda Blanks Minor, NP  pantoprazole 80 mg in sodium chloride 0.9 % 250 mL Inject 8 mg/hr into the vein continuous. 09/13/14   Vilinda Blanks Minor, NP  rifaximin (XIFAXAN) 550 MG TABS tablet Take 1 tablet (550 mg total) by mouth 2 (two) times daily. 09/13/14   Vilinda Blanks Minor, NP   sodium chloride 0.9 % infusion Inject 10 mLs into the vein continuous. 09/13/14   Vilinda Blanks Minor, NP   No Known Allergies CBC:    Component Value Date/Time  WBC 8.8 10/09/2014 0901   HGB 7.8* 10/09/2014 0901   HCT 23.5* 10/09/2014 0901   PLT 245 10/09/2014 0901   MCV 97.5 10/09/2014 0901   NEUTROABS 7.3 10/09/2014 0901   LYMPHSABS 0.9 10/09/2014 0901   MONOABS 0.4 10/09/2014 0901   EOSABS 0.1 10/09/2014 0901   BASOSABS 0.1 10/09/2014 0901   Comprehensive Metabolic Panel:    Component Value Date/Time   NA 130* 10/09/2014 0901   K 3.0* 10/09/2014 0901   CL 91* 10/09/2014 0901   CO2 29 10/09/2014 0901   BUN 28* 10/09/2014 0901   CREATININE 1.98* 10/09/2014 0901   GLUCOSE 106* 10/09/2014 0901   CALCIUM 8.4* 10/09/2014 0901   AST 36 10/09/2014 0901   ALT 23 10/09/2014 0901   ALKPHOS 374* 10/09/2014 0901   BILITOT 10.5* 10/09/2014 0901   PROT 5.6* 10/09/2014 0901   ALBUMIN 2.5* 10/09/2014 0901    Physical Exam: Vital Signs: BP 99/71 mmHg  Pulse 85  Temp(Src) 97.8 F (36.6 C) (Oral)  Resp 8  Ht 4\' 11"  (1.499 m)  Wt 50.6 kg (111 lb 8.8 oz)  BMI 22.52 kg/m2  SpO2 96% SpO2: SpO2: 96 % O2 Device: O2 Device: Not Delivered O2 Flow Rate:   Intake/output summary: No intake or output data in the 24 hours ending 10/09/14 1318 LBM: Last BM Date: 10/09/14 Baseline Weight: Weight: 45 kg (99 lb 3.3 oz) Most recent weight: Weight: 50.6 kg (111 lb 8.8 oz)  Exam Findings:   sick appearing young AA lady Mild dry oral mucosa Abdominal pain diffusely tender distended No edema Patient is awake alert but in distress                      Palliative Performance Scale:   20% Additional Data Reviewed: Recent Labs     10/09/14  0245  10/09/14  0901  WBC  6.6  8.8  HGB  8.8*  7.8*  PLT  238  245  NA  133*  130*  BUN  28*  28*  CREATININE  1.99*  1.98*   Time In: 1300 Time Out: 1400 Time Total: 60 min  Greater than 50%  of this time was spent counseling and coordinating  care related to the above assessment and plan.  Signed by: Rosalin Hawking, MD [phone]  Rosalin Hawking, MD  10/09/2014, 1:18 PM  Please contact Palliative Medicine Team phone at [phone] for questions and concerns.

## 2014-10-09 NOTE — Progress Notes (Addendum)
PATIENT DETAILS Name: Melanie Conley Age: 24 y.o. 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  Subjective: Still complains of significant abdominal pain.  Assessment/Plan: Principal Problem: Suspected SBP: Start Claforan, diagnostic paracentesis ordered. No evidence of perforation on x-ray abdomen. GI consulted  Active Problems: Acute renal failure:In a setting of suspected SBP-either prerenal azotemia or developing hepatorenal. Hold diuretics, cautiously hydrate, give albumen. Check urine sodium. Await input from GI. Will consult with hepatology team at [facility] where patient follows.  Addendum 10:50 a.m.-spoke with [facility] hepatology on-call Dr. Lajuana Matte discussed, patient accepted but no beds available for immediate transfer. Furthermore, per Dr Zach-patient no longer active on the transplant list-even with current acute issues will not be placed on a transplant list given numerous social issues outlined in discharge summary and notes in care everywhere. He recommends to continue with current treatment as outlined in this progress note, and await to see if a bed will be available at [facility]. Will consult palliative care.  Hypokalemia:replete and recheck. Holding diuretics at present  Decompensated cirrhosis with hx of autoimmune hepatitis/PSC overlap: Resume Imuran, prednisone and ursodiol. Significantly worsened LFTs-suspect flare of underlying autoimmune hepatitis/PSC .Will give one low-dose stress hydrocortisone.  History of SBP: Will likely need prophylactic therapy on discharge.  History of hepatic  encephalopathy: Continue with lactulose and rifaximin. Although lethargic, awake and alert.  History of portal vein thrombosis: Recently diagnosed in [facility]-not a candidate for anticoagulation per discharge summary from [facility].  History of variceal bleeding: Reviewed last discharge summary from [facility]-patient most recently had an endoscopy with banding on 8/14 (here at [facility])-unfortunately, she was diagnosed with a portal vein thrombosis-and could not proceed with BRTO.No hx of melena or hematemesis. Start PPI. Resume her beta blocker when more stable.  Anemia: Likely secondary to critical illness, no evidence of invasive bleeding at present. Follow closely. Type and screen.  History of CMV viremia: Continue with valganciclovir for prophylaxis.  Recent history of C. difficile colitis: Recently completed a course of vancomycin. No evidence of diarrhea at present.  Disposition: Remain inpatient-will speak with [facility] Hepatology to see if they will accept transfer  Antimicrobial agents  See below  Anti-infectives    Start     Dose/Rate Route Frequency Ordered Stop   10/09/14 0815  cefTRIAXone (ROCEPHIN) 1 g in dextrose 5 % 50 mL IVPB     1 g 100 mL/hr over 30 Minutes Intravenous Every 24 hours 10/09/14 0813     10/09/14 0815  cefoTAXime (CLAFORAN) 2 g in dextrose 5 % 50 mL IVPB     2 g 100 mL/hr over 30 Minutes Intravenous 3 times per day 10/09/14 0807        DVT Prophylaxis:  SCD's  Code Status: Full code   Family Communication None at bedside-but left message for mother Blaise Palladino  [phone]  Procedures: None  CONSULTS:  GI  Time spent 40 minutes-Greater than 50% of this time was spent in counseling, explanation of diagnosis, planning of further management, and coordination of care.  MEDICATIONS: Scheduled Meds: . heparin  5,000 Units Subcutaneous 3 times per day  .  insulin aspart  0-9 Units Subcutaneous TID WC  . lactulose  20 g Oral BID   Continuous Infusions: . albumin  human    . cefoTAXime (CLAFORAN) IV    . cefTRIAXone (ROCEPHIN)  IV    . potassium chloride     PRN Meds:.albuterol, morphine injection    PHYSICAL EXAM: Vital signs in last 24 hours: Filed Vitals:   10/09/14 0600 10/09/14 0630 10/09/14 0715 10/09/14 0800  BP: 92/42 123/64 108/69 106/64  Pulse: 92 94 87 83  Temp:      TempSrc:      Resp: 14 15    Height:    4\' 11"  (1.499 m)  Weight:    45 kg (99 lb 3.3 oz)  SpO2: 97% 93% 97% 99%    Weight change:  Filed Weights   10/09/14 0800  Weight: 45 kg (99 lb 3.3 oz)   Body mass index is 20.03 kg/(m^2).   Gen Exam: Awake and alert-but lethargic. In mod distress-due to pain Neck: Supple,  Chest: B/L Clear. CVS: S1 S2 Regular, no murmurs.  Abdomen: soft, BS +, distended-diffusely tender all over Extremities: no edema, lower extremities warm to touch. Neurologic: Non Focal.   Skin: No Rash.   Wounds: N/A.    Intake/Output from previous day: No intake or output data in the 24 hours ending 10/09/14 0814   LAB RESULTS: CBC  Recent Labs Lab 10/09/14 0245  WBC 6.6  HGB 8.8*  HCT 26.4*  PLT 238  MCV 97.8  MCH 32.6  MCHC 33.3  RDW 23.4*  LYMPHSABS 0.8  MONOABS 0.3  EOSABS 0.1  BASOSABS 0.1    Chemistries   Recent Labs Lab 10/09/14 0245  NA 133*  K 2.2*  CL 92*  CO2 27  GLUCOSE 91  BUN 28*  CREATININE 1.99*  CALCIUM 8.8*    CBG: No results for input(s): GLUCAP in the last 168 hours.  GFR Estimated Creatinine Clearance: 30 mL/min (by C-G formula based on Cr of 1.99).  Coagulation profile No results for input(s): INR, PROTIME in the last 168 hours.  Cardiac Enzymes No results for input(s): CKMB, TROPONINI, MYOGLOBIN in the last 168 hours.  Invalid input(s): CK  Invalid input(s): POCBNP No results for input(s): DDIMER in the last 72 hours. No results for input(s): HGBA1C in the last 72 hours. No results for input(s): CHOL, HDL, LDLCALC, TRIG, CHOLHDL, LDLDIRECT in the last 72 hours. No results  for input(s): TSH, T4TOTAL, T3FREE, THYROIDAB in the last 72 hours.  Invalid input(s): FREET3 No results for input(s): VITAMINB12, FOLATE, FERRITIN, TIBC, IRON, RETICCTPCT in the last 72 hours.  Recent Labs  10/09/14 0245  LIPASE 55*    Urine Studies No results for input(s): UHGB, CRYS in the last 72 hours.  Invalid input(s): UACOL, UAPR, USPG, UPH, UTP, UGL, UKET, UBIL, UNIT, UROB, ULEU, UEPI, UWBC, URBC, UBAC, CAST, UCOM, BILUA  MICROBIOLOGY: No results found for this or any previous visit (from the past 240 hour(s)).  RADIOLOGY STUDIES/RESULTS: Ct Head Wo Contrast  09/11/2014   CLINICAL DATA:  Lethargy and confusion.  EXAM: CT HEAD WITHOUT CONTRAST  TECHNIQUE: Contiguous axial images were obtained from the base of the skull through the vertex without intravenous contrast.  COMPARISON:  None.  FINDINGS: The brain demonstrates no evidence of hemorrhage, infarction, edema, mass effect, extra-axial fluid collection, hydrocephalus or mass lesion. The skull is unremarkable.  IMPRESSION: Normal head CT.   Electronically Signed   By: Irish Lack M.D.   On:  09/11/2014 08:15   US Paracentesis  09/23/2014   INDICATION: Primary sclerosing cholangitis, autoimmune hepatitis, recurrent ascites. Request is made for diagnostic and therapeutic paracentesis.  EXAM: ULTRASOUND-GUIDED DIAGNOSTIC AND THERAPEUTIC PARACENTESIS  COMPARISON:  Prior paracentesis on 09/05/2014  MEDICATIONS: None.  COMPLICATIONS: None immediate  TECHNIQUE: Informed written consent was obtained from the patient after a discussion of the risks, benefits and alternatives to treatment. A timeout was performed prior to the initiation of the procedure.  Initial ultrasound scanning demonstrates a moderate amount of ascites within the right lower abdominal quadrant. The right lower abdomen was prepped and draped in the usual sterile fashion. 1% lidocaine was used for local anesthesia. Under direct ultrasound guidance, a 19 gauge, 10-cm,  Yueh catheter was introduced. An ultrasound image was saved for documentation purposed. The paracentesis was performed. The catheter was removed and a dressing was applied. The patient tolerated the procedure well without immediate post procedural complication.  FINDINGS: A total of approximately 3 liters of slightly turbid, yellow fluid was removed. Samples were sent to the laboratory as requested by the clinical team.  IMPRESSION: Successful ultrasound-guided diagnostic and therapeutic paracentesis yielding 3 liters of peritoneal fluid. The patient will receive IV albumin infusion postprocedure.  Read by: Jeananne Rama, PA-C   Electronically Signed   By: Gilmer Mor D.O.   On: 09/22/2014 20:12   Dg Chest Port 1 View  09/12/2014   CLINICAL DATA:  Central line placement.  EXAM: PORTABLE CHEST - 1 VIEW  COMPARISON:  09/12/2014 at 5:07 a.m.  FINDINGS: Interval removal of endotracheal tube. Right IJ central venous catheter has been pulled back and tip over the SVC below the level of the head of the right clavicle and 2.5 cm above the carina.  Lungs are hypoinflated and otherwise clear. Cardiomediastinal silhouette and remainder of the exam is unchanged.  IMPRESSION: Hypoinflation without acute cardiopulmonary disease.  Right IJ central venous catheter has been pulled back with tip over the SVC just below the head of the right clavicle.  These results were called by telephone at the time of interpretation on 09/12/2014 at 9:09 pm to patient's nurse, May Aninon, who verbally acknowledged these results.   Electronically Signed   By: Elberta Fortis M.D.   On: 09/12/2014 21:10   Dg Chest Port 1 View  09/12/2014   CLINICAL DATA:  History of bleeding gastric varix. Status post band ligation 09/11/2014. Intubated patient.  EXAM: PORTABLE CHEST - 1 VIEW  COMPARISON:  Single view of the chest 09/11/2014.  FINDINGS: Endotracheal tube remains in place with the tip in good position at the level of the clavicular heads. Right  IJ catheter tip projects in the right atrium. The line should be withdrawn approximately 2.5 cm for better positioning. Lung volumes are low but the lungs are clear. No pneumothorax or pleural effusion. Heart size is normal. Gaseous distention of the stomach seen on yesterday's study has resolved.  IMPRESSION: Endotracheal tube in good position.  Right IJ catheter tip projects in the right atrium. Recommend withdrawal of 2.5 cm.  Left lower lobe airspace disease seen on yesterday's examination has cleared.   Electronically Signed   By: Drusilla Kanner M.D.   On: 09/12/2014 07:30   Dg Chest Port 1 View  09/11/2014   CLINICAL DATA:  24 year old female with sepsis. Intubated and central line placement. Initial encounter.  EXAM: PORTABLE CHEST - 1 VIEW  COMPARISON:  0955 hours today, and earlier  FINDINGS: Portable AP semi upright view at 1219 hours.  Endotracheal tube tip is at the carina and directed slightly toward the right mainstem bronchus. Right IJ central line is been placed, tip projects at the lower SVC level.  No pneumothorax. Left lower lobe collapse or consolidation is new. No pulmonary edema or pleural effusion. Moderate to severe gaseous distension of the stomach is new. Normal cardiac size and mediastinal contours.  IMPRESSION: 1. Intubated, endotracheal tube tip at the carina or just inside the right mainstem bronchus. Retract 3 cm for placement at the level of the clavicles. 2. Left lower lobe collapse/consolidation. Moderate to severe gaseous distension of the stomach. 3. Right IJ central line placed, tip at the lower SVC level. No pneumothorax. Study discussed by telephone with 2 Heart RN Delphia Grates on 09/11/2014 at 1316 hrs.   Electronically Signed   By: Odessa Fleming M.D.   On: 09/11/2014 13:20   Dg Chest Port 1 View  09/11/2014   CLINICAL DATA:  24 year old female with sepsis. Initial encounter.  EXAM: PORTABLE CHEST - 1 VIEW  COMPARISON:  0631 hours today, and earlier  FINDINGS: Portable AP  semi upright view at 0955 hours. Continued low lung volumes. Allowing for portable technique, the lungs are clear. No pneumothorax or pneumoperitoneum. Normal cardiac size and mediastinal contours.  IMPRESSION: No acute cardiopulmonary abnormality.   Electronically Signed   By: Odessa Fleming M.D.   On: 09/11/2014 10:33   Dg Chest Port 1 View  09/11/2014   CLINICAL DATA:  Shortness of breath.  EXAM: PORTABLE CHEST - 1 VIEW  COMPARISON:  August 11, 2014.  FINDINGS: The heart size and mediastinal contours are within normal limits. Both lungs are clear. No pneumothorax or pleural effusion is noted. The visualized skeletal structures are unremarkable.  IMPRESSION: No acute cardiopulmonary abnormality seen.   Electronically Signed   By: Lupita Raider, M.D.   On: 09/11/2014 07:20    Jeoffrey Massed, MD  [facility] Pager:[phone]  If 7PM-7AM, please contact night-coverage [email or web address] Password [email or web address] 10/09/2014, 8:14 AM   LOS: 0 days

## 2014-10-10 ENCOUNTER — Inpatient Hospital Stay (HOSPITAL_COMMUNITY): Payer: BC Managed Care – PPO

## 2014-10-10 ENCOUNTER — Encounter (HOSPITAL_COMMUNITY): Payer: Self-pay | Admitting: Physician Assistant

## 2014-10-10 DIAGNOSIS — K7290 Hepatic failure, unspecified without coma: Secondary | ICD-10-CM

## 2014-10-10 DIAGNOSIS — R188 Other ascites: Secondary | ICD-10-CM | POA: Insufficient documentation

## 2014-10-10 DIAGNOSIS — N19 Unspecified kidney failure: Secondary | ICD-10-CM

## 2014-10-10 DIAGNOSIS — R109 Unspecified abdominal pain: Secondary | ICD-10-CM

## 2014-10-10 LAB — HEPATIC FUNCTION PANEL
ALT: 18 U/L (ref 14–54)
AST: 33 U/L (ref 15–41)
Albumin: 3 g/dL — ABNORMAL LOW (ref 3.5–5.0)
Alkaline Phosphatase: 256 U/L — ABNORMAL HIGH (ref 38–126)
BILIRUBIN DIRECT: 5.3 mg/dL — AB (ref 0.1–0.5)
BILIRUBIN INDIRECT: 4 mg/dL — AB (ref 0.3–0.9)
BILIRUBIN TOTAL: 9.3 mg/dL — AB (ref 0.3–1.2)
Total Protein: 5.4 g/dL — ABNORMAL LOW (ref 6.5–8.1)

## 2014-10-10 LAB — BASIC METABOLIC PANEL
Anion gap: 11 (ref 5–15)
Anion gap: 14 (ref 5–15)
BUN: 27 mg/dL — AB (ref 6–20)
BUN: 24 mg/dL — ABNORMAL HIGH (ref 6–20)
CALCIUM: 9.2 mg/dL (ref 8.9–10.3)
CHLORIDE: 96 mmol/L — AB (ref 101–111)
CO2: 27 mmol/L (ref 22–32)
CO2: 25 mmol/L (ref 22–32)
CREATININE: 1.14 mg/dL — AB (ref 0.44–1.00)
Calcium: 9.7 mg/dL (ref 8.9–10.3)
Chloride: 97 mmol/L — ABNORMAL LOW (ref 101–111)
Creatinine, Ser: 1.38 mg/dL — ABNORMAL HIGH (ref 0.44–1.00)
GFR calc Af Amer: >60 mL/min (ref >60)
GFR, EST NON AFRICAN AMERICAN: 53 mL/min — AB (ref >60)
GLUCOSE: 118 mg/dL — AB (ref 65–99)
Glucose, Bld: 78 mg/dL (ref 65–99)
POTASSIUM: 3.3 mmol/L — AB (ref 3.5–5.1)
Potassium: 2.9 mmol/L — ABNORMAL LOW (ref 3.5–5.1)
SODIUM: 135 mmol/L (ref 135–145)
Sodium: 135 mmol/L (ref 135–145)

## 2014-10-10 LAB — CBC
HCT: 26 % — ABNORMAL LOW (ref 36.0–46.0)
HEMOGLOBIN: 8.8 g/dL — AB (ref 12.0–15.0)
MCH: 30.7 pg (ref 26.0–34.0)
MCHC: 33.8 g/dL (ref 30.0–36.0)
MCV: 90.6 fL (ref 78.0–100.0)
Platelets: 73 10*3/uL — ABNORMAL LOW (ref 150–400)
RBC: 2.87 MIL/uL — AB (ref 3.87–5.11)
RDW: 21.9 % — ABNORMAL HIGH (ref 11.5–15.5)
WBC: 2.8 10*3/uL — ABNORMAL LOW (ref 4.0–10.5)

## 2014-10-10 LAB — GLUCOSE, CAPILLARY
GLUCOSE-CAPILLARY: 105 mg/dL — AB (ref 65–99)
GLUCOSE-CAPILLARY: 175 mg/dL — AB (ref 65–99)
Glucose-Capillary: 120 mg/dL — ABNORMAL HIGH (ref 65–99)
Glucose-Capillary: 111 mg/dL — ABNORMAL HIGH (ref 65–99)

## 2014-10-10 LAB — AMMONIA: AMMONIA: 61 umol/L — AB (ref 9–35)

## 2014-10-10 LAB — PATHOLOGIST SMEAR REVIEW: Path Review: REACTIVE

## 2014-10-10 LAB — MAGNESIUM: MAGNESIUM: 2 mg/dL (ref 1.7–2.4)

## 2014-10-10 LAB — PREPARE RBC (CROSSMATCH)

## 2014-10-10 MED ORDER — LIDOCAINE HCL (PF) 1 % IJ SOLN
INTRAMUSCULAR | Status: AC
  Filled 2014-10-10: qty 10

## 2014-10-10 MED ORDER — POTASSIUM CHLORIDE CRYS ER 20 MEQ PO TBCR
40.0000 meq | EXTENDED_RELEASE_TABLET | Freq: Once | ORAL | Status: AC
  Administered 2014-10-10: 40 meq via ORAL
  Filled 2014-10-10: qty 2

## 2014-10-10 MED ORDER — POTASSIUM CHLORIDE 10 MEQ/100ML IV SOLN
10.0000 meq | INTRAVENOUS | Status: AC
  Administered 2014-10-10 (×3): 10 meq via INTRAVENOUS
  Filled 2014-10-10 (×2): qty 100

## 2014-10-10 MED ORDER — ALBUMIN HUMAN 25 % IV SOLN
50.0000 g | Freq: Once | INTRAVENOUS | Status: DC
  Filled 2014-10-10: qty 200

## 2014-10-10 MED ORDER — SODIUM CHLORIDE 0.9 % IV SOLN
Freq: Once | INTRAVENOUS | Status: AC
  Administered 2014-10-10: 250 mL via INTRAVENOUS

## 2014-10-10 MED ORDER — POTASSIUM CHLORIDE 10 MEQ/100ML IV SOLN: 10.0000 meq | INTRAVENOUS | Status: DC

## 2014-10-10 MED ORDER — POTASSIUM CHLORIDE 10 MEQ/100ML IV SOLN
10.0000 meq | INTRAVENOUS | Status: DC
  Filled 2014-10-10: qty 100

## 2014-10-10 MED ORDER — ALBUMIN HUMAN 25 % IV SOLN
50.0000 g | Freq: Once | INTRAVENOUS | Status: AC
  Administered 2014-10-10: 50 g via INTRAVENOUS
  Filled 2014-10-10: qty 200

## 2014-10-10 MED ORDER — SODIUM CHLORIDE 0.9 % IV SOLN
Freq: Once | INTRAVENOUS | Status: AC
  Administered 2014-10-10: 10:00:00 via INTRAVENOUS

## 2014-10-10 MED ORDER — DIPHENHYDRAMINE HCL 50 MG/ML IJ SOLN
25.0000 mg | Freq: Once | INTRAMUSCULAR | Status: AC
  Administered 2014-10-10: 25 mg via INTRAVENOUS
  Filled 2014-10-10: qty 1

## 2014-10-10 NOTE — Progress Notes (Signed)
I met very briefly Melanie Conley this evening. I stopped by after paracentesis to see how she was feeling and hopefully get to know her little before beginning discussions regarding goals of care moving forward. She reports she is feeling much better symptomatically since having the paracentesis. When asked a very general question of how things have been been going prior to coming in the hospital, she became withdrawn. She reports that she really is not in the mood to talk any further this evening. She states she is getting some of her appetite back and wanted to focus on her Popsicle and dinner tray. She did say she would be okay if I return for visit tomorrow.  I asked if her mother would be in to visit tomorrow and she said that she would. I told her I was hoping to meet her mother as well and was wanting to stop by whenever she is visiting tomorrow. She is not sure what time. I called the number for her mother and left a message to call back. My hope is to schedule a family meeting for tomorrow in order to further discuss goals of care moving forward.  Based on conversation today, I am not sure if Melanie Conley is at a place emotionally where she would be willing to engage in discussions about goals of care moving forward. Our team has not received return calls from her mother, and so I'm not sure how open she is to palliative involvement either. We'll plan to follow-up tomorrow with hope of scheduling family meeting to discuss goals of care for tomorrow afternoon.  Micheline Rough, MD [facility] [phone]

## 2014-10-10 NOTE — Progress Notes (Addendum)
Progress Note   Subjective  Patient reports ongoing abdominal pain, also feels fatigued today. 1 point drop in Hgb noted. Patient has not had any bowel movements. BUN/CR improved.    Objective   Vital signs in last 24 hours: Temp:  [97.6 F (36.4 C)-98.4 F (36.9 C)] 98.1 F (36.7 C) (09/12 0815) Pulse Rate:  [51-121] 88 (09/12 0815) Resp:  [8-18] 12 (09/12 0815) BP: (83-115)/(46-71) 108/53 mmHg (09/12 0815) SpO2:  [77 %-100 %] 100 % (09/12 0815) Last BM Date: 10/09/14 General:    African American female lying in bed Heart:  Regular rate and rhythm; no murmurs Lungs: Respirations even and unlabored, lungs CTA bilaterally Abdomen:  Soft, distended with ascites, diffusely tender without rebound. Normal bowel sounds. Extremities:  Minimal, trace edema B. Neurologic:  Alert and oriented, mild asterixis noted Psych:  Cooperative. Normal mood and affect.  Intake/Output from previous day: 09/11 0701 - 09/12 0700 In: 1523.8 [P.O.:90; I.V.:1033.8; IV Piggyback:400] Out: 1150 [Urine:1150] Intake/Output this shift: Total I/O In: 335 [Blood:335] Out: -   Lab Results:  Recent Labs  10/09/14 0245 10/09/14 0901 10/10/14 0258  WBC 6.6 8.8 4.0  HGB 8.8* 7.8* 6.3*  HCT 26.4* 23.5* 19.2*  PLT 238 245 106*   BMET  Recent Labs  10/09/14 0245 10/09/14 0901 10/10/14 0258  NA 133* 130* 135  K 2.2* 3.0* 2.9*  CL 92* 91* 97*  CO2 GLUCOSE 91 106* 118*  BUN 28* 28* 27*  CREATININE 1.99* 1.98* 1.38*  CALCIUM 8.8* 8.4* 9.2   LFT  Recent Labs  10/10/14 0258  PROT 5.4*  ALBUMIN 3.0*  AST 33  ALT 18  ALKPHOS 256*  BILITOT 9.3*  BILIDIR 5.3*  IBILI 4.0*   PT/INR  Recent Labs  10/09/14 0901  LABPROT 17.0*  INR 1.38    Studies/Results: US Renal  10/09/2014   CLINICAL DATA:  Renal failure.  EXAM: RENAL / URINARY TRACT ULTRASOUND COMPLETE  COMPARISON:  None.  FINDINGS: Right Kidney:  Length: 10.2 cm. Echogenicity within normal limits. No mass or  hydronephrosis visualized.  Left Kidney:  Length: 10.5 cm. Echogenicity within normal limits. No mass or hydronephrosis visualized.  Bladder:  Appears normal for degree of bladder distention.  IMPRESSION: No evidence for obstructive uropathy.   Electronically Signed   By: Elsie Stain M.D.   On: 10/09/2014 10:15   US Paracentesis  10/09/2014   CLINICAL DATA:  Primary sclerosing cholangitis, autoimmune hepatitis, recurrent ascites. Request is made for diagnostic only paracentesis with a 500 milliliter limit.  EXAM: ULTRASOUND GUIDED RIGHT LOWER QUADRANT PARACENTESIS  COMPARISON:  None.  PROCEDURE: An ultrasound guided paracentesis was thoroughly discussed with the patient and questions answered. The benefits, risks, alternatives and complications were also discussed. The patient understands and wishes to proceed with the procedure. Written consent was obtained.  Ultrasound was performed to localize and mark an adequate pocket of fluid in the right quadrant of the abdomen. The area was then prepped and draped in the normal sterile fashion. 1% Lidocaine was used for local anesthesia. Under ultrasound guidance a 19 gauge Yueh catheter was introduced. Paracentesis was performed. The catheter was removed and a dressing applied.  COMPLICATIONS: None.  FINDINGS: A total of approximately 500 mls of clear yellow fluid was removed. A fluid sample was sent for laboratory analysis.  IMPRESSION: Successful ultrasound guided paracentesis yielding 500 mls of ascites.  Read by:  Corrin Parker, PA-C   Electronically Signed  By: Gilmer Mor D.O.   On: 10/09/2014 11:49   Dg Abd 2 Views  10/09/2014   CLINICAL DATA:  Epigastric abdominal pain, vomiting, patient reports history of liver disease  EXAM: ABDOMEN - 2 VIEW  COMPARISON:  CT 09/05/2014  FINDINGS: The bowel gas pattern is normal. There is no evidence of free air. No radio-opaque calculi or other significant radiographic abnormality is seen. Mild stool burden. Central  displacement of bowel loops likely reflects ascites.  IMPRESSION: Normal bowel gas pattern but central displacement of bowel loops may indicate ascites.   Electronically Signed   By: Christiana Pellant M.D.   On: 10/09/2014 09:28   US Abdomen Limited Ruq  10/09/2014   CLINICAL DATA:  Primary biliary cirrhosis.  Abdominal pain  EXAM: US ABDOMEN LIMITED - RIGHT UPPER QUADRANT  COMPARISON:  None.  FINDINGS: Gallbladder:  No gallstones or wall thickening visualized. No sonographic Murphy sign noted.  Common bile duct:  Diameter: 3.8 mm.  Liver:  The liver appears atrophic and cirrhotic. No focal lesion identified.  Other:  Marked abdominal ascites.  IMPRESSION: 1. No acute findings. 2. Cirrhosis 3. Ascites.   Electronically Signed   By: Signa Kell M.D.   On: 10/09/2014 10:10       Assessment / Plan:   Problem list: Decompensated cirrhosis - MELD 22 Ascites H/o hepatic encephalopathy Anemia Abdominal pain   24 y/o female with AIH/PSC overlap with decompensated cirrhosis complicated by history of gastric variceal bleeding, ascites, encephalopathy, and jaundice. Also with splenic infarcts and portal vein thrombosis. Critically ill with MELD of 22. She has been evaluated by [facility] Hepatology and not thought to be a transplant candidate, nor was she a candidate for TIPS or BRTO per IR.   She did not meet criteria for SBP based on labs, unclear if her pain is from tight ascites vs. splenic infarct / clot burden, vs other. I agree with large volume paracentesis today, with albumin, as creatinine is improved, to see if this helps relieve some of her pain. Slight drop in Hgb is noted however BUN is stable and the patient has not had any evidence of GI blood loss, no bowel movements in the past 24 hours, however would monitor this closely. Perhaps this is due to blood draws and critical illness however in discussion with primary will add octreotide for now empirically and monitor this trend and bowel movements  given recent history of variceal bleed. Hgb goal is 7 following transfusion given history of varices.  Moving forward given she has been deemed to NOT be a transplant candidate and that she is critically ill, agree with palliative care consult to determine goals of care moving forward. Otherwise, agree with supportive care, continue PPI, medications for encephalopathy and underlying liver disease.  If she has any evidence of active GI bleeding or change in her status please let us know.   Ileene Patrick, MD [facility] Pager [phone]   Principal Problem:   Abdominal pain, acute Active Problems:   Abdominal pain   Pain   Renal failure   Encounter for palliative care     LOS: 1 day   Reeves Forth Armbruster  10/10/2014, 9:31 AM

## 2014-10-10 NOTE — Progress Notes (Signed)
[facility] PROGRESS NOTE  Melanie Conley ZOX:[id number] DOB: 1990-09-09 DOA: 10/09/2014 PCP: Woodfin Ganja, MD  HPI/Subjective: Today the patient continues to have abdominal pain. Alert, oriented, and responsive to commands. Denies complications from paracentesis done yesterday. Expresses desire to have another paracentesis to relive pain. Continues to deny hematemesis, melana, and hematochzia. Reminded patient that she is no longer on active transplant list per Dr. Ian Malkin at [facility] d/t social issues.   Assessment/Plan: Abdomen Pain: initially SBP suspected, but ascitic fluid shows no evidence of SBP.Culture still pending. Xray neg for free air or SBO. Abd pain could be from tense ascites, will attempt large vol paracentesis and see if pain resolved.  Continue abx x1 day d/t possible infectious etiology.  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 No SBP per paracentesis. Plan is to complete additional therapeutic paracentesis today. Will give Albumin. Continue to hold diuretics-will resume soon, if renal function stays stable.  Anemia: CBC shows rapidly decreasing hgb (currently 6.3, 8.8 on admission). Etiology unknown at this time- still no evidence of active bleeding at present. Could be from critical illness. Extensive history of variceal bleeding is concerning for active bleed-that is not yet evident. BUN stable. Transfused PRBC x2 today. Continue PPI. Consider Octreotide if evidence of bleed. Continue to follow closely for signs of active bleed and monitor CBC for improvement. GI consulted.   Acute renal failure: Suspect pre-renal azotemia-doubt hepatorenal given improvent in renal function following IVF. Marland Kitchen Renal US shows no evidence of obstructive uropathy. Continue to hold diuretics, cautiously hydrate, give albumin. Urine sodium>10. Continue to monitor.   Hypokalemia: Continue to replete and recheck. Continue holding diuretics until issue is more resolved.   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 LFTs improving from yesterday- still suspect flare of underlying autoimmune hepatitis/PSC. Patient no longer candidate for transplant d/t social complications per Dr. Ian Malkin at  [facility] (see d/c summary from [facility] Aug 2016). Palliative care consulted and will be setting up family meeting today.  History of SBP: Consider prophylactic therapy on discharge.   History of hepatic encephalopathy: Continue with lactulose and rifaximin. Although lethargic, awake and alert.  History of portal vein thrombosis: Recently diagnosed in [facility]-not a candidate for anticoagulation per discharge summary from [facility].  History of variceal bleeding: Reviewed last discharge summary from [facility]-patient most recently had an endoscopy with banding on 8/14 (here at [facility])-unfortunately, she was diagnosed with a portal vein thrombosis-and could not proceed with BRTO.No hx of melena or hematemesis. Start PPI. Resume her beta blocker when more stable.  History of CMV viremia: Continue with valganciclovir for prophylaxis.  Recent history of C. difficile colitis: Recently completed a course of vancomycin. No evidence of diarrhea at present.  Code Status: Full Family Communication: None at bedside  Disposition Plan: Transfer to [facility] when bed available    Consultants:  GI   Palliative Care   Procedures:  US guided paracentesis from RLQ  Antibiotics:  See below    Anti-infectives    Start   Dose/Rate Route Frequency Ordered Stop   10/09/14 0815  cefTRIAXone (ROCEPHIN) 1 g in dextrose 5 % 50 mL IVPB    1 g 100 mL/hr over 30 Minutes Intravenous Every 24 hours 10/09/14 0813    10/09/14 0815  cefoTAXime (CLAFORAN) 2 g in dextrose 5 % 50 mL IVPB    2 g 100 mL/hr over 30 Minutes Intravenous 3 times per day 10/09/14 0807  Objective: Filed Vitals:   10/10/14 1036  BP: 108/66  Pulse: 92  Temp:  98.1 F (36.7 C)  Resp: 13    Intake/Output Summary (Last 24 hours) at 10/10/14 1043 Last data filed at 10/10/14 1036  Gross per 24 hour  Intake 1888.83 ml  Output    900 ml  Net 988.83 ml   Filed Weights   10/09/14 0800 10/09/14 0900  Weight: 45 kg (99 lb 3.3 oz) 50.6 kg (111 lb 8.8 oz)    Exam:   General:  Frail-appearing young woman laying in bed, appears in distress with movement.  HEENT: + Icterus  Cardiovascular: RRR, no M/R/G, No peripheral edema.   Respiratory: CTA b/l, no wheeze or crackles  Abdomen: Distended, tender on palpation. + Fluid shift, Unable to test shifting dullness d/t pain.    Neuro: A&Ox3, appears tired, but respond appropriately to questions and commands. UE/LE moving appropriately.   Data Reviewed: Basic Metabolic Panel:  Recent Labs Lab 10/09/14 0245 10/09/14 0901 10/10/14 0258  NA 133* 130* 135  K 2.2* 3.0* 2.9*  CL 92* 91* 97*  CO2 27 29 27   GLUCOSE 91 106* 118*  BUN 28* 28* 27*  CREATININE 1.99* 1.98* 1.38*  CALCIUM 8.8* 8.4* 9.2  MG  --   --  2.0   Liver Function Tests:  Recent Labs Lab 10/09/14 0245 10/09/14 0901 10/10/14 0258  AST 38 36 33  ALT 23 23 18   ALKPHOS 439* 374* 256*  BILITOT 11.9* 10.5* 9.3*  PROT 6.4* 5.6* 5.4*  ALBUMIN 2.9* 2.5* 3.0*    Recent Labs Lab 10/09/14 0245  LIPASE 55*    Recent Labs Lab 10/10/14 0258  AMMONIA 61*   CBC:  Recent Labs Lab 10/09/14 0245 10/09/14 0901 10/10/14 0258  WBC 6.6 8.8 4.0  NEUTROABS 5.3 7.3  --   HGB 8.8* 7.8* 6.3*  HCT 26.4* 23.5* 19.2*  MCV 97.8 97.5 97.0  PLT 238 245 106*   Cardiac Enzymes: No results for input(s): CKTOTAL, CKMB, CKMBINDEX, TROPONINI in the last 168 hours. BNP (last 3 results) No results for input(s): BNP in the last 8760 hours.  ProBNP (last 3 results) No results for input(s): PROBNP in the last 8760 hours.  CBG:  Recent Labs Lab 10/09/14 1240 10/09/14 1650 10/09/14 2059 10/10/14 0819  GLUCAP 106* 115* 128* 120*     Recent Results (from the past 240 hour(s))  MRSA PCR Screening     Status: None   Collection Time: 10/09/14 10:13 AM  Result Value Ref Range Status   MRSA by PCR NEGATIVE NEGATIVE Final    Comment:        The GeneXpert MRSA Assay (FDA approved for NASAL specimens only), is one component of a comprehensive MRSA colonization surveillance program. It is not intended to diagnose MRSA infection nor to guide or monitor treatment for MRSA infections.   Gram stain     Status: None   Collection Time: 10/09/14 11:34 AM  Result Value Ref Range Status   Specimen Description FLUID PERITONEAL  Final   Special Requests NONE  Final   Gram Stain NO WBC SEEN NO ORGANISMS SEEN   Final   Report Status 10/09/2014 FINAL  Final     Studies: US Renal  10/09/2014   CLINICAL DATA:  Renal failure.  EXAM: RENAL / URINARY TRACT ULTRASOUND COMPLETE  COMPARISON:  None.  FINDINGS: Right Kidney:  Length: 10.2 cm. Echogenicity within normal limits. No mass or hydronephrosis visualized.  Left Kidney:  Length:  10.5 cm. Echogenicity within normal limits. No mass or hydronephrosis visualized.  Bladder:  Appears normal for degree of bladder distention.  IMPRESSION: No evidence for obstructive uropathy.   Electronically Signed   By: Elsie Stain M.D.   On: 10/09/2014 10:15   US Paracentesis  10/09/2014   CLINICAL DATA:  Primary sclerosing cholangitis, autoimmune hepatitis, recurrent ascites. Request is made for diagnostic only paracentesis with a 500 milliliter limit.  EXAM: ULTRASOUND GUIDED RIGHT LOWER QUADRANT PARACENTESIS  COMPARISON:  None.  PROCEDURE: An ultrasound guided paracentesis was thoroughly discussed with the patient and questions answered. The benefits, risks, alternatives and complications were also discussed. The patient understands and wishes to proceed with the procedure. Written consent was obtained.  Ultrasound was performed to localize and mark an adequate pocket of fluid in the right quadrant  of the abdomen. The area was then prepped and draped in the normal sterile fashion. 1% Lidocaine was used for local anesthesia. Under ultrasound guidance a 19 gauge Yueh catheter was introduced. Paracentesis was performed. The catheter was removed and a dressing applied.  COMPLICATIONS: None.  FINDINGS: A total of approximately 500 mls of clear yellow fluid was removed. A fluid sample was sent for laboratory analysis.  IMPRESSION: Successful ultrasound guided paracentesis yielding 500 mls of ascites.  Read by:  Corrin Parker, PA-C   Electronically Signed   By: Gilmer Mor D.O.   On: 10/09/2014 11:49   Dg Abd 2 Views  10/09/2014   CLINICAL DATA:  Epigastric abdominal pain, vomiting, patient reports history of liver disease  EXAM: ABDOMEN - 2 VIEW  COMPARISON:  CT 09/05/2014  FINDINGS: The bowel gas pattern is normal. There is no evidence of free air. No radio-opaque calculi or other significant radiographic abnormality is seen. Mild stool burden. Central displacement of bowel loops likely reflects ascites.  IMPRESSION: Normal bowel gas pattern but central displacement of bowel loops may indicate ascites.   Electronically Signed   By: Christiana Pellant M.D.   On: 10/09/2014 09:28   US Abdomen Limited Ruq  10/09/2014   CLINICAL DATA:  Primary biliary cirrhosis.  Abdominal pain  EXAM: US ABDOMEN LIMITED - RIGHT UPPER QUADRANT  COMPARISON:  None.  FINDINGS: Gallbladder:  No gallstones or wall thickening visualized. No sonographic Murphy sign noted.  Common bile duct:  Diameter: 3.8 mm.  Liver:  The liver appears atrophic and cirrhotic. No focal lesion identified.  Other:  Marked abdominal ascites.  IMPRESSION: 1. No acute findings. 2. Cirrhosis 3. Ascites.   Electronically Signed   By: Signa Kell M.D.   On: 10/09/2014 10:10    Scheduled Meds: . albumin human  50 g Intravenous Once  . azaTHIOprine  25 mg Oral Daily  . cefoTAXime (CLAFORAN) IV  2 g Intravenous 3 times per day  . insulin aspart  0-9 Units  Subcutaneous TID WC  . lactulose  20 g Oral BID  . metronidazole  500 mg Intravenous Q8H  . pantoprazole (PROTONIX) IV  40 mg Intravenous Q12H  . potassium chloride  10 mEq Intravenous Q1 Hr x 3  . predniSONE  5 mg Oral Q breakfast  . rifaximin  550 mg Oral BID  . ursodiol  300 mg Oral BID  . valGANciclovir  900 mg Oral BID   Continuous Infusions: . sodium chloride 50 mL/hr at 10/10/14 4098    Principal Problem:   Abdominal pain, acute Active Problems:   Abdominal pain   Pain   Renal failure   Encounter for  palliative care  Time spent: 30 minutes  Micayla Zeltman, Student-PA  [facility] If 7PM-7AM, please contact night-coverage at [email or web address], password [facility] 10/10/2014, 10:43 AM  LOS: 1 day   Attending MD note  Patient was seen, examined,treatment plan was discussed with PA-S.  I have personally reviewed the clinical findings, lab, imaging studies and management of this patient in detail. I agree with the documentation, as recorded by PA-S.   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 Ascites fluid negative for SBP, cultures pending, continues to have abdominal pain-although seems more comfortable than yesterday. Plan for large volume paracentesis to see if abdominal pain improves. Also renal function much improved with IV fluid, would continue to hold diuretics while we are attempting large volume paracentesis. If abdominal pain continues post-paracentesis, and renal function permits-we will consider doing imaging study with IV contrast to further evaluate better. In the meantime, continue with plans as outlined above.  [facility]

## 2014-10-11 LAB — GLUCOSE, CAPILLARY
Glucose-Capillary: 114 mg/dL — ABNORMAL HIGH (ref 65–99)
Glucose-Capillary: 116 mg/dL — ABNORMAL HIGH (ref 65–99)
Glucose-Capillary: 184 mg/dL — ABNORMAL HIGH (ref 65–99)
Glucose-Capillary: 103 mg/dL — ABNORMAL HIGH (ref 65–99)

## 2014-10-11 LAB — BASIC METABOLIC PANEL
Anion gap: 6 (ref 5–15)
BUN: 12 mg/dL (ref 6–20)
CALCIUM: 8.8 mg/dL — AB (ref 8.9–10.3)
CO2: 26 mmol/L (ref 22–32)
CREATININE: 0.78 mg/dL (ref 0.44–1.00)
Chloride: 99 mmol/L — ABNORMAL LOW (ref 101–111)
Glucose, Bld: 134 mg/dL — ABNORMAL HIGH (ref 65–99)
Potassium: 2.9 mmol/L — ABNORMAL LOW (ref 3.5–5.1)
Sodium: 131 mmol/L — ABNORMAL LOW (ref 135–145)

## 2014-10-11 LAB — TYPE AND SCREEN
ABO/RH(D): B POS
Antibody Screen: NEGATIVE
UNIT DIVISION: 0
Unit division: 0

## 2014-10-11 LAB — CBC
HCT: 24.6 % — ABNORMAL LOW (ref 36.0–46.0)
HEMOGLOBIN: 8.5 g/dL — AB (ref 12.0–15.0)
MCH: 31 pg (ref 26.0–34.0)
MCHC: 34.6 g/dL (ref 30.0–36.0)
MCV: 89.8 fL (ref 78.0–100.0)
PLATELETS: 51 10*3/uL — AB (ref 150–400)
RBC: 2.74 MIL/uL — ABNORMAL LOW (ref 3.87–5.11)
RDW: 22.7 % — AB (ref 11.5–15.5)
WBC: 2.3 10*3/uL — ABNORMAL LOW (ref 4.0–10.5)

## 2014-10-11 LAB — HEPATIC FUNCTION PANEL
ALT: 14 U/L (ref 14–54)
AST: 22 U/L (ref 15–41)
Albumin: 3.1 g/dL — ABNORMAL LOW (ref 3.5–5.0)
Alkaline Phosphatase: 175 U/L — ABNORMAL HIGH (ref 38–126)
BILIRUBIN DIRECT: 5.4 mg/dL — AB (ref 0.1–0.5)
BILIRUBIN INDIRECT: 4.1 mg/dL — AB (ref 0.3–0.9)
BILIRUBIN TOTAL: 9.5 mg/dL — AB (ref 0.3–1.2)
Total Protein: 4.9 g/dL — ABNORMAL LOW (ref 6.5–8.1)

## 2014-10-11 LAB — PROTIME-INR
INR: 1.95 — AB (ref 0.00–1.49)
PROTHROMBIN TIME: 22.2 s — AB (ref 11.6–15.2)

## 2014-10-11 MED ORDER — FUROSEMIDE 40 MG PO TABS
40.0000 mg | ORAL_TABLET | Freq: Every day | ORAL | Status: DC
  Administered 2014-10-12: 40 mg via ORAL
  Filled 2014-10-11: qty 1

## 2014-10-11 MED ORDER — FUROSEMIDE 20 MG PO TABS
20.0000 mg | ORAL_TABLET | Freq: Once | ORAL | Status: AC
Start: 2014-10-11 — End: 2014-10-11
  Filled 2014-10-11: qty 1

## 2014-10-11 MED ORDER — FUROSEMIDE 20 MG PO TABS
20.0000 mg | ORAL_TABLET | Freq: Every day | ORAL | Status: DC
  Administered 2014-10-11: 20 mg via ORAL
  Filled 2014-10-11: qty 1

## 2014-10-11 MED ORDER — SPIRONOLACTONE 50 MG PO TABS: 100.0000 mg | ORAL_TABLET | Freq: Every day | ORAL | Status: DC

## 2014-10-11 MED ORDER — POTASSIUM CHLORIDE CRYS ER 20 MEQ PO TBCR
40.0000 meq | EXTENDED_RELEASE_TABLET | ORAL | Status: AC
  Administered 2014-10-11 (×2): 40 meq via ORAL
  Filled 2014-10-11 (×2): qty 2

## 2014-10-11 MED ORDER — POTASSIUM CHLORIDE CRYS ER 20 MEQ PO TBCR
20.0000 meq | EXTENDED_RELEASE_TABLET | Freq: Once | ORAL | Status: AC
  Administered 2014-10-11: 20 meq via ORAL
  Filled 2014-10-11: qty 1

## 2014-10-11 MED ORDER — OXYCODONE HCL 5 MG PO TABS
5.0000 mg | ORAL_TABLET | Freq: Four times a day (QID) | ORAL | Status: DC | PRN
  Administered 2014-10-11 – 2014-10-12 (×2): 5 mg via ORAL
  Filled 2014-10-11 (×2): qty 1

## 2014-10-11 MED ORDER — SPIRONOLACTONE 100 MG PO TABS
100.0000 mg | ORAL_TABLET | Freq: Every day | ORAL | Status: DC
  Administered 2014-10-11: 100 mg via ORAL
  Filled 2014-10-11: qty 1

## 2014-10-11 NOTE — Evaluation (Signed)
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  Clinical Impression  Pt is at or close to baseline functioning and should be safe at home or at the next discharge destination. There are no further acute PT needs.  Will sign off at this time.     Follow Up Recommendations No PT follow up    Equipment Recommendations  None recommended by PT    Recommendations for Other Services       Precautions / Restrictions Precautions Precautions: Fall      Mobility  Bed Mobility Overal bed mobility: Independent                Transfers Overall transfer level: Modified independent                  Ambulation/Gait Ambulation/Gait assistance: Modified independent (Device/Increase time) Ambulation Distance (Feet): 1500 Feet Assistive device: None (pushing IV pole)   Gait velocity: age appropriate, able to increase speed appreciably Gait velocity interpretation: at or above normal speed for age/gender General Gait Details: WFL  Stairs Stairs: Yes Stairs assistance: Modified independent (Device/Increase time) Stair Management: One rail Right;Alternating pattern;Forwards Number of Stairs: 3 (limited by length of lines) General stair comments: steady and safe  Wheelchair Mobility    Modified Rankin (Stroke Patients Only)       Balance Overall balance assessment: No apparent balance deficits (not formally assessed)                                           Pertinent Vitals/Pain Pain Assessment: No/denies pain     Home Living Family/patient expects to be discharged to:: Private residence Living Arrangements:  (boyfriend and his mom) Available Help at Discharge: Family Type of Home: Apartment Home Access: Stairs to enter Entrance Stairs-Rails: Doctor, general practice of Steps: 3 Home Layout: One level Home Equipment: None      Prior Function Level of Independence: Independent               Hand Dominance        Extremity/Trunk Assessment               Lower Extremity Assessment: Overall WFL for tasks assessed         Communication   Communication: No difficulties  Cognition Arousal/Alertness: Awake/alert Behavior During Therapy: WFL for tasks assessed/performed Overall Cognitive Status: Within Functional Limits for tasks assessed                      General Comments      Exercises        Assessment/Plan    PT Assessment Patent does not need any further PT services  PT Diagnosis     PT Problem List    PT Treatment Interventions     PT Goals (Current goals can be found in the Care Plan section) Acute Rehab PT Goals PT Goal Formulation: All assessment and education complete, DC therapy  Frequency     Barriers to discharge        Co-evaluation               End of Session   Activity Tolerance: Patient tolerated treatment well Patient left: in bed;Other (comment) (up walking the halls) Nurse Communication: Mobility status         Time: 4098-1191 PT Time Calculation (min) (ACUTE ONLY): 14 min   Charges:   PT Evaluation $Initial PT Evaluation Tier I: 1 Procedure     PT G Codes:        Mcdaniel Ohms, Eliseo Gum 10/11/2014, 2:52 PM 10/11/2014  [place] Bing, PT [phone]  (pager)

## 2014-10-11 NOTE — Progress Notes (Signed)
Daily Progress Note   Patient Name: Melanie Conley       Date: 10/11/2014 DOB: 1990/07/29  Age: 24 y.o. MRN#: [id number] Attending Physician: Jonetta Osgood, MD Primary Care Physician: Rayvon Char, MD Admit Date: 10/09/2014  Reason for Consultation/Follow-up: Establishing goals of care  Subjective: 24 YO female with PMH cirrhosis 2/2 autoimmune hepatitis/PSC overlap c/b esophageal varicies, acites, SBP (on ppx) and encephalopathy, h/o CMV viremia, multiple STDs, currently undergoing w/u for liver transplant at [facility]-presents with worsening abdominal pain 3 days duration.  Interval Events: I met with Melanie Conley this morning and she reports again that she is not in the mood and does not want to talk. She reports she is feeling better and wants to go home.  I spoke with Melanie Conley who is the nurse practitioner from the liver transplant team at [facility]. I also spoke with her mother, Melanie Conley on the phone after obtaining Melanie Conley's permission. They both relate a similar story of Melanie Conley's complex social situation with me, including the fact that she is currently living with a boyfriend who is not able to provide the level of care or social support she would require in order to be listed for liver transplant. She was previously listed when she was living with her mother, however Melanie Conley has moved out and reports that she is not planning to move back in with her mother. She has been noted by both her mother and the transplant team to have limited emotional maturity as well as poor insight into her medical problems. The transplant team from [facility] has a meeting tomorrow and is planning to discuss her case to make a determination if it appears likely she will ever be a candidate for liver transplant in the future. Melanie Conley's insight continues to be a problem and she has consistently refused to consider moving back with in with her mother in order to allow her to obtain adequate social support that would be  necessary for her to be successful with transplant if that would become a viable option.  Her mother reports that she is not sure how to help her moving forward. She does endorse that she was advised to obtain legal guardianship for her daughter. She states that she had legal paperwork completed but it turns out that this was for healthcare power of attorney and not the required documentation to become legal guardian. She reports that she is planning to call lawyer at the conclusion of our conversation to see if she can have paperwork tried up to become legal guardian. She was in agreement with plan for meeting to discuss plan of care moving forward after the transplant team from [facility] has the opportunity to meet tomorrow and further discuss Melanie Conley's case.  - Melanie Conley has a very complex situation including end-stage liver disease and poor support that has precluded her from being a viable candidate for liver transplant. Her mother seems well invested in trying to help her daughter, however Ms. Melanie Conley has refused to undertake steps outlined as necessary by the transplant team to make transplant a realistic option.  - Transplant team is planning to discuss her case again tomorrow. Will await results of this discussion prior to trying to engage furthering goals of care conversation.  - Both the patient and her mother have been very resistant to talking up to this point in time. Her mother and I did have a good conversation today and I tried to support her as best possible as she is trying  to figure out a way to help her daughter despite Melanie Conley's reluctance to adhere to necessary steps that would allow her to possibly be listed again for transplant.  - I do not think that either of them are emotionally in a place where they will be able to have a discussion regarding end-of-life care. I will continue to follow-up during this admission.  Length of Stay: 2 days  Current Medications: Scheduled Meds:  .  azaTHIOprine  25 mg Oral Daily  . furosemide  20 mg Oral Once  . [START ON 10/12/2014] furosemide  40 mg Oral Daily  . lactulose  20 g Oral BID  . potassium chloride  40 mEq Oral Q4H  . predniSONE  5 mg Oral Q breakfast  . rifaximin  550 mg Oral BID  . [START ON 10/12/2014] spironolactone  100 mg Oral Daily  . ursodiol  300 mg Oral BID  . valGANciclovir  900 mg Oral BID    Continuous Infusions: . sodium chloride 30 mL/hr at 10/11/14 1200    PRN Meds: albuterol, HYDROmorphone (DILAUDID) injection  Palliative Performance Scale: 60%     Vital Signs: BP 92/46 mmHg  Pulse 87  Temp(Src) 97.9 F (36.6 C) (Oral)  Resp 16  Ht 4' 11" (1.499 m)  Wt 50.6 kg (111 lb 8.8 oz)  BMI 22.52 kg/m2  SpO2 99% SpO2: SpO2: 99 % O2 Device: O2 Device: Not Delivered O2 Flow Rate:    Intake/output summary:  Intake/Output Summary (Last 24 hours) at 10/11/14 1424 Last data filed at 10/11/14 1200  Gross per 24 hour  Intake   3160 ml  Output      0 ml  Net   3160 ml   LBM:   Baseline Weight: Weight: 45 kg (99 lb 3.3 oz) Most recent weight: Weight: 50.6 kg (111 lb 8.8 oz)  Physical Exam: General: Alert, awake, in no acute distress.  HEENT: No bruits, no goiter, no JVD Heart: Regular rate and rhythm. No murmur appreciated. Lungs: Good air movement, clear Abdomen: Soft, nontender, mild distended, positive bowel sounds.  Ext: No significant edema Skin: Warm and dry Neuro: Grossly intact, nonfocal. Additional Data Reviewed: Recent Labs     10/10/14  1451  10/11/14  0240  10/11/14  0831  WBC  2.8*  2.3*   --   HGB  8.8*  8.5*   --   PLT  73*  51*   --   NA  135   --   131*  BUN  24*   --   12  CREATININE  1.14*   --   0.78     Problem List:  Patient Active Problem List   Diagnosis Date Noted  . Ascites   . Pain   . Renal failure   . Encounter for palliative care   . Severe sepsis with acute organ dysfunction 09/11/2014  . Acute respiratory failure with hypercapnia   .  Coagulopathy   . Other cirrhosis of liver   . Gastric varix   . Colitis 09/05/2014  . Hepatic encephalopathy 09/05/2014  . Acute blood loss anemia 09/05/2014  . Irregular periods 08/04/2014  . Primary sclerosing cholangitis   . Cirrhosis   . Pyrexia 06/04/2014  . Nausea and vomiting 06/04/2014  . Cirrhosis, non-alcoholic 16/10/9602  . Hypoalbuminemia 06/04/2014  . Hypotension 06/04/2014  . Pancytopenia   . Jaundice   . Sepsis 05/28/2014  . Generalized abdominal pain   . Spontaneous bacterial peritonitis 05/27/2014  . Leukopenia 05/27/2014  .  Hypokalemia   . Neutropenia   . SBP (spontaneous bacterial peritonitis) 01/14/2014  . Autoimmune hepatitis   . Protein-calorie malnutrition, severe 01/13/2014  . Abdominal pain, acute 01/12/2014  . Abdominal pain 01/12/2014  . Anemia   . GI bleeding 07/22/2011     Palliative Care Assessment & Plan    Code Status:  Full code  Goals of Care  This is a complicated case that is made more difficult due to the fact that Ms. Granito does not seem to understand the severity of her condition. As noted above, she had been on transplant list in the past but she is currently not listed due to her social situation. I spoke with nurse practitioner from [facility] transplant team and they are planning to discuss her case again tomorrow at their weekly meeting. It does not seem likely that she will ever qualify to be listed as candidate for liver transplant. Her mother seems to be invested in trying to help her daughter and states she is willing to participate in any way possible, however she has not been working to obtain legal guardianship as prior recommended from [facility] team. She tells me she is planning to call a lawyer today. If patient is still here following decision by transplant team, I will meet with them in order to try and further goals of care discussion moving forward. To this point, however, Zitlali has been very reluctant to even talk with me,  and I doubt that she will be willing to do so in the future during this visit either.  Symptom Manpatient reports her pain and other symptoms are much improved following another paracentesis.  Psycho-social/Spiritual:  Desire for further Chaplaincy support:no   Prognosis: Unable to determine at this time, but if she is not due for liver transplant her prognosis would be less than 6 months Discharge Planning: To be determined.   Care plan was discussed with the patient and her mother, Melanie Conley  Thank you for allowing the Palliative Medicine Team to assist in the care of this patient.   Time In: 1430 Time Out: 1400 Total Time 30 Prolonged Time Billed no    Greater than 50%  of this time was spent counseling and coordinating care related to the above assessment and plan.   Micheline Rough, MD  10/11/2014, 2:24 PM  Please contact Palliative Medicine Team phone at [phone] for questions and concerns.

## 2014-10-11 NOTE — Progress Notes (Signed)
[facility] PROGRESS NOTE  SONA NATIONS ZOX:[id number] DOB: 1990/05/15 DOA: 10/09/2014 PCP: Woodfin Ganja, MD  HPI/Subjective: Today the patient appears to be doing much better. Mild abdominal pain that is significantly improved following additional paracentesis yesterday. Alert, oriented, and responsive to commands. Reports mild drainage from paracentesis site, but no other complications. Continues to deny hematemesis, melana, and hematochzia. Expresses desire to go home.   Assessment/Plan: Abdomen Pain: Initially SBP suspected, but ascitic fluid shows no evidence of SBP.Culture shows no growth to date. Significant relief of pain post paracentesis suggests etiology of tense ascites. Discontinued abx.   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 No SBP per paracentesis. Paracentesis on 10/10/14 removed an additional 3.7 L. Renal function significantly improved. Will resume Spironolactone and Lasix at lower dose initially. Continue to monitor BMP and consider original dose of diuretics upon discharge if renal function remains stable.    Anemia: Most recent CBC shows improving hgb (currently 8.5). Etiology still unknown at this time- still no evidence of active bleeding. Suspected etiology at this time is critical illness.  BUN remains stable. Plan is to continue PPI. Consider Octreotide if evidence of bleed. Continue to follow closely for signs of active bleed and monitor CBC for improvement. Continue GI following.   Acute renal failure: Suspect pre-renal azotemia-doubt hepatorenal given improvent in renal function following IVF.  Renal US shows no evidence of obstructive uropathy. Renal function WNL today. See ascites section for diuretic plans. Discontinued IV fluids.  Continue to monitor for changes.   Hypokalemia: Potassium continues to decrease today (2.9). Started oral potassium supplement. Continue to recheck.   Hyponatremia: Suspected etiology from cirrhosis. Continue to  monitor.   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 LFTs continue to improve- still suspect flare of underlying autoimmune hepatitis/PSC. Palliative care continues to be involved.   History of SBP: Still consider prophylactic therapy on discharge.   History of hepatic encephalopathy: Continue with lactulose and rifaximin. Patient is awake and alert.  History of portal vein thrombosis: Recently diagnosed in [facility]-not a candidate for anticoagulation per discharge summary from [facility].  History of variceal bleeding: Reviewed last discharge summary from [facility]-patient most recently had an endoscopy with banding on 8/14 (here at [facility])-unfortunately, she was diagnosed with a portal vein thrombosis-and could not proceed with BRTO.No hx of melena or hematemesis. Continue PPI. Resume beta blocker when more stable.  History of CMV viremia: Continue with valganciclovir for prophylaxis.  Recent history  Code Status: Full Family Communication: None at bedside  Disposition Plan: Home following stabilization    Consultants:  GI   Palliative Care   Procedures:  US guided paracentesis (removal of 3.7L)   Antibiotics:  None (disontinued Ceftriaxone and Cefotaxime)     Objective: Filed Vitals:   10/11/14 0800  BP: 94/59  Pulse: 74  Temp: 98 F (36.7 C)  Resp:     Intake/Output Summary (Last 24 hours) at 10/11/14 1230 Last data filed at 10/11/14 8119  Gross per 24 hour  Intake   3320 ml  Output      0 ml  Net   3320 ml   Filed Weights   10/09/14 0800 10/09/14 0900  Weight: 45 kg (99 lb 3.3 oz) 50.6 kg (111 lb 8.8 oz)    Exam:   General:  Well-appearing young woman, laying in bed, in no acute distress  HEENT: + Icterus  Cardiovascular: RRR, no M/R/G, No peripheral edema.   Respiratory: CTA b/l, no wheeze or crackles.  Abdomen: Slight distention, no tenderness on palpation. + mild fluid shifting dullness.    Neuro: A&Ox3, appears tired, but respond appropriately to questions and commands. UE/LE moving appropriately.  Data Reviewed: Basic Metabolic Panel:  Recent Labs Lab 10/09/14 0245 10/09/14 0901 10/10/14 0258 10/10/14 1451 10/11/14 0831  NA 133* 130* 135 135 131*  K 2.2* 3.0* 2.9* 3.3* 2.9*  CL 92* 91* 97* 96* 99*  CO2 27 29 27 25 26   GLUCOSE 91 106* 118* 78 134*  BUN 28* 28* 27* 24* 12  CREATININE 1.99* 1.98* 1.38* 1.14* 0.78  CALCIUM 8.8* 8.4* 9.2 9.7 8.8*  MG  --   --  2.0  --   --    Liver Function Tests:  Recent Labs Lab 10/09/14 0245 10/09/14 0901 10/10/14 0258 10/11/14 0240  AST 38 36 33 22  ALT 23 23 18 14   ALKPHOS 439* 374* 256* 175*  BILITOT 11.9* 10.5* 9.3* 9.5*  PROT 6.4* 5.6* 5.4* 4.9*  ALBUMIN 2.9* 2.5* 3.0* 3.1*    Recent Labs Lab 10/09/14 0245  LIPASE 55*    Recent Labs Lab 10/10/14 0258  AMMONIA 61*   CBC:  Recent Labs Lab 10/09/14 0245 10/09/14 0901 10/10/14 0258 10/10/14 1451 10/11/14 0240  WBC 6.6 8.8 4.0 2.8* 2.3*  NEUTROABS 5.3 7.3  --   --   --   HGB 8.8* 7.8* 6.3* 8.8* 8.5*  HCT 26.4* 23.5* 19.2* 26.0* 24.6*  MCV 97.8 97.5 97.0 90.6 89.8  PLT 238 245 106* 73* 51*   Cardiac Enzymes: No results for input(s): CKTOTAL, CKMB, CKMBINDEX, TROPONINI in the last 168 hours. BNP (last 3 results) No results for input(s): BNP in the last 8760 hours.  ProBNP (last 3 results) No results for input(s): PROBNP in the last 8760 hours.  CBG:  Recent Labs Lab 10/10/14 1149 10/10/14 1732 10/10/14 2134 10/11/14 0833 10/11/14 1216  GLUCAP 111* 105* 175* 114* 116*    Recent Results (from the past 240 hour(s))  Culture, blood (routine x 2)     Status: None (Preliminary result)   Collection Time: 10/09/14  8:35 AM  Result Value Ref Range Status   Specimen Description BLOOD RIGHT ARM  Final   Special Requests BOTTLES DRAWN AEROBIC ONLY 3CC  Final   Culture NO GROWTH 1 DAY  Final   Report Status PENDING  Incomplete  Culture,  blood (routine x 2)     Status: None (Preliminary result)   Collection Time: 10/09/14  8:45 AM  Result Value Ref Range Status   Specimen Description BLOOD RIGHT HAND  Final   Special Requests BOTTLES DRAWN AEROBIC ONLY 4CC  Final   Culture NO GROWTH 1 DAY  Final   Report Status PENDING  Incomplete  MRSA PCR Screening     Status: None   Collection Time: 10/09/14 10:13 AM  Result Value Ref Range Status   MRSA by PCR NEGATIVE NEGATIVE Final    Comment:        The GeneXpert MRSA Assay (FDA approved for NASAL specimens only), is one component of a comprehensive MRSA colonization surveillance program. It is not intended to diagnose MRSA infection nor to guide or monitor treatment for MRSA infections.   Culture, body fluid-bottle     Status: None (Preliminary result)   Collection Time: 10/09/14 11:34 AM  Result Value Ref Range Status   Specimen Description FLUID PERITONEAL  Final   Special Requests NONE  Final   Culture NO GROWTH 1 DAY  Final   Report  Status PENDING  Incomplete  Gram stain     Status: None   Collection Time: 10/09/14 11:34 AM  Result Value Ref Range Status   Specimen Description FLUID PERITONEAL  Final   Special Requests NONE  Final   Gram Stain NO WBC SEEN NO ORGANISMS SEEN   Final   Report Status 10/09/2014 FINAL  Final     Studies: US Paracentesis  10/11/2014   CLINICAL DATA:  Chronic liver disease with recurrent ascites.  EXAM: ULTRASOUND GUIDED PARACENTESIS  COMPARISON:  10/09/2014  PROCEDURE: An ultrasound guided paracentesis was thoroughly discussed with the patient and questions answered. The benefits, risks, alternatives and complications were also discussed. The patient understands and wishes to proceed with the procedure. Written consent was obtained.  Ultrasound was performed to localize and mark an adequate pocket of fluid in the right lower quadrant of the abdomen. The area was then prepped and draped in the normal sterile fashion. 1% Lidocaine was  used for local anesthesia. Under ultrasound guidance a 6 French Safe-T-Centesis catheter was introduced. Paracentesis was performed. The catheter was removed and a dressing applied.  COMPLICATIONS: None.  FINDINGS: A total of approximately 3.7 L of clear, yellow fluid was removed.  IMPRESSION: Successful ultrasound guided paracentesis yielding 3.7 L of ascites.   Electronically Signed   By: Irish Lack M.D.   On: 10/11/2014 07:47    Scheduled Meds: . azaTHIOprine  25 mg Oral Daily  . furosemide  20 mg Oral Daily  . lactulose  20 g Oral BID  . pantoprazole (PROTONIX) IV  40 mg Intravenous Q12H  . potassium chloride  40 mEq Oral Q4H  . predniSONE  5 mg Oral Q breakfast  . rifaximin  550 mg Oral BID  . spironolactone  100 mg Oral Daily  . ursodiol  300 mg Oral BID  . valGANciclovir  900 mg Oral BID   Continuous Infusions: . sodium chloride 30 mL/hr at 10/11/14 0800    Principal Problem:   Abdominal pain, acute Active Problems:   Abdominal pain   Pain   Renal failure   Encounter for palliative care   Ascites    Time spent: 30 minutes    Divine Imber, Student-PA  [facility] If 7PM-7AM, please contact night-coverage at [email or web address], password [facility] 10/11/2014, 12:30 PM  LOS: 2 days

## 2014-10-11 NOTE — Care Management Note (Signed)
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      Action/Plan:   Transfer to [facility] when bed available, CM to f/u with disposition needs.     Expected Discharge Date:                  Expected Discharge Plan:  Acute to Acute Transfer  In-House Referral:     Discharge planning Services  CM Consult  Post Acute Care Choice:    Choice offered to:     DME Arranged:    DME Agency:     HH Arranged:    HH Agency:     Status of Service:  In process, will continue to follow  Medicare Important Message Given:    Date Medicare IM Given:    Medicare IM give by:    Date Additional Medicare IM Given:    Additional Medicare Important Message give by:     If discussed at Long Length of Stay Meetings, dates discussed:    Additional Comments:  9/12 Paracentesis (3.7L) , 500 ml tap on 9/11.  Palliative consult in place, plan to f/u 10/11/14.    Chanie Soucek (Mother)  [phone]   Marcelino Duster, [place]  [phone] 10/11/2014, 2:22 PM

## 2014-10-11 NOTE — Progress Notes (Signed)
Daily Rounding Note  10/11/2014, 1:11 PM  LOS: 2 days   SUBJECTIVE:       Abdomen feels better.  Eating 100% of trays.  Wants to go home  OBJECTIVE:         Vital signs in last 24 hours:    Temp:  [97.9 F (36.6 C)-98.9 F (37.2 C)] 97.9 F (36.6 C) (09/13 1200) Pulse Rate:  [74-101] 87 (09/13 1200) Resp:  [13-19] 16 (09/13 0405) BP: (92-112)/(46-64) 92/46 mmHg (09/13 1200) SpO2:  [96 %-100 %] 99 % (09/13 1200) Last BM Date: 10/10/14 Filed Weights   10/09/14 0800 10/09/14 0900  Weight: 99 lb 3.3 oz (45 kg) 111 lb 8.8 oz (50.6 kg)   General: thin, comfortable.  Looks chronically ill   Heart: RRR Chest: clear Abdomen: soft, still protuberant, not tense but not entirely soft.  NT.  Active BS  Extremities: no CCE Neuro/Psych:  Oriented x 3, no asterixis  Intake/Output from previous day: 09/12 0701 - 09/13 0700 In: 4365 [P.O.:1860; I.V.:1040; Blood:665; IV Piggyback:800] Out: -   Intake/Output this shift: Total I/O In: 390 [P.O.:240; I.V.:150] Out: -   Lab Results:  Recent Labs  10/10/14 0258 10/10/14 1451 10/11/14 0240  WBC 4.0 2.8* 2.3*  HGB 6.3* 8.8* 8.5*  HCT 19.2* 26.0* 24.6*  PLT 106* 73* 51*   BMET  Recent Labs  10/10/14 0258 10/10/14 1451 10/11/14 0831  NA 135 135 131*  K 2.9* 3.3* 2.9*  CL 97* 96* 99*  CO2 GLUCOSE 118* 78 134*  BUN 27* 24* 12  CREATININE 1.38* 1.14* 0.78  CALCIUM 9.2 9.7 8.8*   LFT  Recent Labs  10/09/14 0901 10/10/14 0258 10/11/14 0240  PROT 5.6* 5.4* 4.9*  ALBUMIN 2.5* 3.0* 3.1*  AST 36 33 22  ALT ALKPHOS 374* 256* 175*  BILITOT 10.5* 9.3* 9.5*  BILIDIR  --  5.3* 5.4*  IBILI  --  4.0* 4.1*   PT/INR  Recent Labs  10/09/14 0901 10/11/14 0240  LABPROT 17.0* 22.2*  INR 1.38 1.95*   Hepatitis Panel No results for input(s): HEPBSAG, HCVAB, HEPAIGM, HEPBIGM in the last 72 hours.  Studies/Results: US Paracentesis  10/11/2014    CLINICAL DATA:  Chronic liver disease with recurrent ascites.  EXAM: ULTRASOUND GUIDED PARACENTESIS  COMPARISON:  10/09/2014  PROCEDURE: An ultrasound guided paracentesis was thoroughly discussed with the patient and questions answered. The benefits, risks, alternatives and complications were also discussed. The patient understands and wishes to proceed with the procedure. Written consent was obtained.  Ultrasound was performed to localize and mark an adequate pocket of fluid in the right lower quadrant of the abdomen. The area was then prepped and draped in the normal sterile fashion. 1% Lidocaine was used for local anesthesia. Under ultrasound guidance a 6 French Safe-T-Centesis catheter was introduced. Paracentesis was performed. The catheter was removed and a dressing applied.  COMPLICATIONS: None.  FINDINGS: A total of approximately 3.7 L of clear, yellow fluid was removed.  IMPRESSION: Successful ultrasound guided paracentesis yielding 3.7 L of ascites.   Electronically Signed   By: Irish Lack M.D.   On: 10/11/2014 07:47   Scheduled Meds: . azaTHIOprine  25 mg Oral Daily  . furosemide  20 mg Oral Daily  . lactulose  20 g Oral BID  . pantoprazole (PROTONIX) IV  40 mg Intravenous Q12H  . potassium chloride  40 mEq Oral Q4H  .  predniSONE  5 mg Oral Q breakfast  . rifaximin  550 mg Oral BID  . spironolactone  100 mg Oral Daily  . ursodiol  300 mg Oral BID  . valGANciclovir  900 mg Oral BID   Continuous Infusions: . 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  Not a transplant candidate. MELD (UNOS) 22. On Imuran and ursodiol..   *  Abdominal pain.  Improved after larger volume (3.7 liters)paracentesis on 9/12.  Previous 500 ml tap on 9/11.  No evidence for SBP.  Got 50 gram albumin infusion post tap #2.   *  Hx esophageal and gastric  varices, s/p multiple EGDs with band ligation. Last was 09/11/2014.  *  Coagulopathy.   *  Chronic anemia.    *  Chronic thrombocytopenia.   *  AKI.  Resolved.   *  Hepatic encephalopathy.  On Rifaximin and Lactulose    PLAN   *  Will likely need prn paracentesis in future.    *  Starting back on home diuretics, but at lower than home doses to start with.  Will need to have BMET monitored within 7 to 10 days of discharge.    *  Follow up with hepatologist, Woodfin Ganja, at [facility].      Jennye Moccasin  10/11/2014, 1:11 PM Pager: [phone]   Attending Addendum: I agree with the Advanced Practitioner's note and impression.  24 y/o female with AIH/PSC overlap with decompensated cirrhosis complicated by history of gastric variceal bleeding, ascites, encephalopathy, and jaundice. Also with splenic infarcts and portal vein thrombosis.  MELD of 22. She has been evaluated by [facility] Hepatology and not thought to be a transplant candidate, nor was she a candidate for TIPS or BRTO per IR.   She did not meet criteria for SBP based on labs. Underwent large volume paracentesis with significant improvement in pain yesterday. Renal function stable, received albumin infusion. Slight drop in Hgb is noted previously but patient did not have any evidence of active GI bleeding and octreotide was stopped. Perhaps anemia was due to blood draws and critical illness.   At this time her pain is resolved and she feels at baseline. We will resume diuretics. She has been having scheduled paracentesis every 2 weeks as outpatient. If she feels she needs it sooner due to tight ascites she should come in to get it sooner. She has f/u scheduled with [facility] transplant hepatology. If tolerating a diet and labs stable I think she is safe for discharge home.  Please call with questions / concerns.    Ileene Patrick, MD [facility] Gastroenterology Pager [phone]

## 2014-10-12 DIAGNOSIS — R188 Other ascites: Secondary | ICD-10-CM

## 2014-10-12 DIAGNOSIS — R100 Acute abdomen: Secondary | ICD-10-CM

## 2014-10-12 DIAGNOSIS — K7460 Unspecified cirrhosis of liver: Secondary | ICD-10-CM

## 2014-10-12 DIAGNOSIS — E871 Hypo-osmolality and hyponatremia: Secondary | ICD-10-CM

## 2014-10-12 DIAGNOSIS — N179 Acute kidney failure, unspecified: Secondary | ICD-10-CM

## 2014-10-12 LAB — CBC
HCT: 19.2 % — ABNORMAL LOW (ref 36.0–46.0)
HEMATOCRIT: 26.6 % — AB (ref 36.0–46.0)
HEMOGLOBIN: 6.3 g/dL — AB (ref 12.0–15.0)
Hemoglobin: 9 g/dL — ABNORMAL LOW (ref 12.0–15.0)
MCH: 31.8 pg (ref 26.0–34.0)
MCH: 31.8 pg (ref 26.0–34.0)
MCHC: 32.8 g/dL (ref 30.0–36.0)
MCHC: 33.8 g/dL (ref 30.0–36.0)
MCV: 97 fL (ref 78.0–100.0)
MCV: 94 fL (ref 78.0–100.0)
PLATELETS: 106 10*3/uL — AB (ref 150–400)
Platelets: 69 10*3/uL — ABNORMAL LOW (ref 150–400)
RBC: 1.98 MIL/uL — ABNORMAL LOW (ref 3.87–5.11)
RBC: 2.83 MIL/uL — ABNORMAL LOW (ref 3.87–5.11)
RDW: 23.4 % — AB (ref 11.5–15.5)
RDW: 23.4 % — AB (ref 11.5–15.5)
WBC: 4 10*3/uL (ref 4.0–10.5)
WBC: 2.2 10*3/uL — ABNORMAL LOW (ref 4.0–10.5)

## 2014-10-12 LAB — BASIC METABOLIC PANEL
Anion gap: 6 (ref 5–15)
BUN: 8 mg/dL (ref 6–20)
CHLORIDE: 97 mmol/L — AB (ref 101–111)
CO2: 28 mmol/L (ref 22–32)
Calcium: 8.8 mg/dL — ABNORMAL LOW (ref 8.9–10.3)
Creatinine, Ser: 0.75 mg/dL (ref 0.44–1.00)
GFR calc Af Amer: >60 mL/min (ref >60)
GFR calc non Af Amer: >60 mL/min (ref >60)
GLUCOSE: 95 mg/dL (ref 65–99)
POTASSIUM: 4.2 mmol/L (ref 3.5–5.1)
Sodium: 131 mmol/L — ABNORMAL LOW (ref 135–145)

## 2014-10-12 MED ORDER — ONDANSETRON HCL 4 MG/2ML IJ SOLN
4.0000 mg | Freq: Four times a day (QID) | INTRAMUSCULAR | Status: DC | PRN
  Administered 2014-10-12: 4 mg via INTRAVENOUS
  Filled 2014-10-12: qty 2

## 2014-10-12 MED ORDER — ONDANSETRON HCL 4 MG PO TABS: 4.0000 mg | ORAL_TABLET | Freq: Three times a day (TID) | ORAL | Status: DC | PRN

## 2014-10-12 MED ORDER — VALGANCICLOVIR HCL 450 MG PO TABS: 900.0000 mg | ORAL_TABLET | Freq: Two times a day (BID) | ORAL | Status: DC

## 2014-10-12 MED ORDER — SPIRONOLACTONE 50 MG PO TABS
150.0000 mg | ORAL_TABLET | Freq: Every day | ORAL | Status: DC
  Administered 2014-10-12: 150 mg via ORAL
  Filled 2014-10-12: qty 6
  Filled 2014-10-12: qty 3

## 2014-10-12 MED ORDER — FUROSEMIDE 20 MG PO TABS: 40.0000 mg | ORAL_TABLET | Freq: Every day | ORAL | Status: DC

## 2014-10-12 NOTE — Progress Notes (Signed)
10/12/2014 3:03 PM [name] to be D/C'd Home per MD order.  Discussed prescriptions and follow up appointments with the patient. Prescriptions given to patient, medication list explained in detail. Pt verbalized understanding.    Medication List    STOP taking these medications        albuterol (2.5 MG/3ML) 0.083% nebulizer solution  Commonly known as:  PROVENTIL     hydrocortisone sodium succinate 100 MG Solr injection  Commonly known as:  SOLU-CORTEF     insulin aspart 100 UNIT/ML injection  Commonly known as:  novoLOG     metroNIDAZOLE 500 MG tablet  Commonly known as:  FLAGYL     octreotide 2 mcg/mL Soln  Commonly known as:  SANDOSTATIN     octreotide 500 mcg in sodium chloride 0.9 % 250 mL     pantoprazole 80 mg in sodium chloride 0.9 % 250 mL     sodium chloride 0.9 % infusion      TAKE these medications        azaTHIOprine 50 MG tablet  Commonly known as:  IMURAN  Take 25 mg by mouth daily.     carvedilol 3.125 MG tablet  Commonly known as:  COREG  Take 6.25 mg by mouth daily.     furosemide 20 MG tablet  Commonly known as:  LASIX  Take 2 tablets (40 mg total) by mouth daily.     lactulose 10 GM/15ML solution  Commonly known as:  CHRONULAC  Take 30 mLs (20 g total) by mouth 2 (two) times daily.     Lidocaine HCl 2 % Soln  Take 200 mg by mouth 3 (three) times daily. For gi upset: 10 mls (200 mg) each dose     magnesium oxide 400 MG tablet  Commonly known as:  MAG-OX  Take 400 mg by mouth daily.     metoCLOPramide 5 MG tablet  Commonly known as:  REGLAN  Take 5 mg by mouth 3 (three) times daily before meals. 30 minutes before each meal     omeprazole 40 MG capsule  Commonly known as:  PRILOSEC  Take 40 mg by mouth daily.     ondansetron 4 MG tablet  Commonly known as:  ZOFRAN  Take 1 tablet (4 mg total) by mouth every 8 (eight) hours as needed for nausea or vomiting.     predniSONE 5 MG tablet  Commonly known as:  DELTASONE  Take 5 mg  by mouth daily with breakfast.     rifaximin 550 MG Tabs tablet  Commonly known as:  XIFAXAN  Take 1 tablet (550 mg total) by mouth 2 (two) times daily.     spironolactone 50 MG tablet  Commonly known as:  ALDACTONE  Take 150 mg by mouth daily.     sulfamethoxazole-trimethoprim 800-160 MG per tablet  Commonly known as:  BACTRIM DS,SEPTRA DS  Take 1 tablet by mouth daily.     ursodiol 300 MG capsule  Commonly known as:  ACTIGALL  Take 600 mg by mouth daily.     valGANciclovir 450 MG tablet  Commonly known as:  VALCYTE  Take 2 tablets (900 mg total) by mouth 2 (two) times daily.        Filed Vitals:   10/12/14 0900  BP: 112/55  Pulse: 88  Temp: 97.2 F (36.2 C)  Resp: 18    Skin clean, dry and intact without evidence of skin break down, no evidence of skin tears noted. IV catheter discontinued intact. Site  without signs and symptoms of complications. Dressing and pressure applied. Pt denies pain at this time. No complaints noted.  An After Visit Summary was printed and given to the patient. Patient escorted via WC, and D/C home via private auto.  Bradly Chris

## 2014-10-12 NOTE — Progress Notes (Addendum)
    Progress Note   Subjective  No abdominal pain. Eating. No complaints.    Objective   Vital signs in last 24 hours: Temp:  [97.6 F (36.4 C)-98.6 F (37 C)] 97.8 F (36.6 C) (09/14 0422) Pulse Rate:  [79-102] 102 (09/14 0422) Resp:  [15-18] 17 (09/14 0422) BP: (92-112)/(43-64) 102/64 mmHg (09/14 0422) SpO2:  [97 %-100 %] 99 % (09/14 0422) Weight:  [100 lb 8 oz (45.587 kg)] 100 lb 8 oz (45.587 kg) (09/14 0121) Last BM Date: 10/10/14 General:    black female in NAD Heart:  Regular rate and rhythm Abdomen:  Soft, nontender, mildy distended. Normal bowel sounds. Extremities:  Without edema. Neurologic:  Alert and oriented,  grossly normal neurologically. Psych:  Cooperative. Normal mood and affect.  Lab Results:  Recent Labs  10/10/14 1451 10/11/14 0240 10/12/14 0508  WBC 2.8* 2.3* 2.2*  HGB 8.8* 8.5* 9.0*  HCT 26.0* 24.6* 26.6*  PLT 73* 51* 69*   BMET  Recent Labs  10/10/14 1451 10/11/14 0831 10/12/14 0508  NA 135 131* 131*  K 3.3* 2.9* 4.2  CL 96* 99* 97*  CO2 GLUCOSE 78 134* 95  BUN 24* 12 8  CREATININE 1.14* 0.78 0.75  CALCIUM 9.7 8.8* 8.8*   LFT  Recent Labs  10/11/14 0240  PROT 4.9*  ALBUMIN 3.1*  AST 22  ALT 14  ALKPHOS 175*  BILITOT 9.5*  BILIDIR 5.4*  IBILI 4.1*   PT/INR  Recent Labs  10/11/14 0240  LABPROT 22.2*  INR 1.95*     Assessment / Plan:   Decompensated cirrhosis / end stage liver disease due to AIH / PSC overlap. Apparently not a transplant candidate at this time. Multiple EGDs with band ligation. Currently admitted with abdominal pain. She is s/p paracentesis ( ) on 9/11 and a LVP (3.7liters) 9/12. No evidence for SBP on first specimen (I don't see that cell count was done second time). Palliative Care involved. Complex case with a lot of social issues. Patient wants to go home and follow up with [facility] outpatient - states she has appt there 9/20. Marland Kitchen     LOS: 3 days   Willette Cluster  10/12/2014, 10:16  AM   Addendum: Agreed. Patient stable for discharge at this time. Resume diuretics and may consider paracentesis more frequently to prevent tight ascites. Seeing [facility] hepatology soon and they are reassessing her transplant candidacy.   Ileene Patrick, MD [facility] Gastroenterology Pager [phone]

## 2014-10-12 NOTE — Progress Notes (Signed)
Report called to 6E02 pt transferred without issue .

## 2014-10-12 NOTE — Progress Notes (Signed)
Transfer note:  Arrival Method: wheelchair Mental Orientation:A&Ox4 Telemetry:none Assessment: see doc flowsheet Skin: Dry, intact IV: left forearm nsl    And right wrist nsl Pain: Denies pain Tubes:none Fall Prevention Safety Plan: Reviewed with pt 6700 Orientation: Patient has been oriented to the unit, staff and to the room.

## 2014-10-12 NOTE — Progress Notes (Addendum)
PATIENT DETAILS Name: Melanie Conley Age: 24 y.o. Sex: female Date of Birth: [birth year] MRN: [id number]. Admitting Physician: Eston Esters, MD WUJ:[id number],WGNF, MD  Admit Date: 10/09/2014 Discharge date: 10/12/2014  Recommendations for Outpatient Follow-up:  1. FU with medical team at [facility] for management of chronic conditions. Encouraged to FU w/ [facility] for management of future ascites/pain due to complication of case.  2. FU with palliative care team at [facility] following transplant team discussions.   PRIMARY DISCHARGE DIAGNOSIS:  Principal Problem:   Abdominal pain, acute Active Problems:   Abdominal pain   Pain   Renal failure   Encounter for palliative care   Ascites      PAST MEDICAL HISTORY: Past Medical History  Diagnosis Date  . Anemia 2012  . Esophageal varices 2015    s/p multiple EGDs and bandings dating to 2015.   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 GIB (gastrointestinal bleeding)   . Cirrhosis 2003  . Irregular periods 08/04/2014  . Vaginal Pap smear, abnormal   . Hepatic encephalopathy   . Coagulopathy   . STD (female)     mutiple. risky sexual behavior.   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 Ascites 2003    DISCHARGE MEDICATIONS: Current Discharge Medication List    START taking these medications   Details  valGANciclovir (VALCYTE) 450 MG tablet Take 2 tablets (900 mg total) by mouth 2 (two) times daily. Qty: 120 tablet, Refills: 0      CONTINUE these medications which have CHANGED   Details  furosemide (LASIX) 20 MG tablet Take 2 tablets (40 mg total) by mouth daily. Qty: 60 tablet, Refills: 0    ondansetron (ZOFRAN) 4 MG tablet Take 1 tablet (4 mg total) by mouth every 8 (eight) hours as needed for nausea or vomiting. Qty: 20 tablet, Refills: 0      CONTINUE these medications which have NOT CHANGED   Details  azaTHIOprine (IMURAN) 50 MG tablet Take 25 mg by mouth daily.    carvedilol  (COREG) 3.125 MG tablet Take 6.25 mg by mouth daily.    lactulose (CHRONULAC) 10 GM/15ML solution Take 30 mLs (20 g total) by mouth 2 (two) times daily. Qty: 240 mL, Refills: 0    Lidocaine HCl 2 % SOLN Take 200 mg by mouth 3 (three) times daily. For gi upset: 10 mls (200 mg) each dose    magnesium oxide (MAG-OX) 400 MG tablet Take 400 mg by mouth daily.    metoCLOPramide (REGLAN) 5 MG tablet Take 5 mg by mouth 3 (three) times daily before meals. 30 minutes before each meal    omeprazole (PRILOSEC) 40 MG capsule Take 40 mg by mouth daily.    predniSONE (DELTASONE) 5 MG tablet Take 5 mg by mouth daily with breakfast.    rifaximin (XIFAXAN) 550 MG TABS tablet Take 1 tablet (550 mg total) by mouth 2 (two) times daily. Qty: 60 tablet    spironolactone (ALDACTONE) 50 MG tablet Take 150 mg by mouth daily.    sulfamethoxazole-trimethoprim (BACTRIM DS,SEPTRA DS) 800-160 MG per tablet Take 1 tablet by mouth daily.    ursodiol (ACTIGALL) 300 MG capsule Take 600 mg by mouth daily.      STOP taking these medications     albuterol (PROVENTIL) (2.5 MG/3ML) 0.083% nebulizer solution      hydrocortisone sodium succinate (SOLU-CORTEF) 100 MG SOLR injection      insulin aspart (NOVOLOG) 100 UNIT/ML injection  metroNIDAZOLE (FLAGYL) 500 MG tablet      octreotide (SANDOSTATIN) 2 mcg/mL SOLN      octreotide 500 mcg in sodium chloride 0.9 % 250 mL      pantoprazole 80 mg in sodium chloride 0.9 % 250 mL      sodium chloride 0.9 % infusion         ALLERGIES:   Allergies  Allergen Reactions  . 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 (pt was in the hospital when this occurred - May 2016) Has patient had a PCN reaction causing immediate rash, facial/tongue/throat swelling, SOB or lightheadedness with hypotension: No Has patient had a PCN reaction causing severe rash involving mucus membranes or skin necrosis: No Has patient had a PCN reaction that required  hospitalization Yes Has patient had a PCN reaction occurring within the last 10 years: Yes If all of the above answers are "NO", then moay proceed with Ceph    BRIEF HPI:  See H&P, Labs, Consult and Test reports for all details in brief, patient was admitted to unit from ED following episode of severe abdominal pain x3 days. Complicated hx of autoimmune hepatitis/PSC overlap esophageal varicies, acites, SBP, and encephalopathy warranted admission for further workup. See hospital course for detailed information.   CONSULTATIONS:  GI  Palliative Care   PERTINENT RADIOLOGIC STUDIES: US Renal  10/09/2014   CLINICAL DATA:  Renal failure.  EXAM: RENAL / URINARY TRACT ULTRASOUND COMPLETE  COMPARISON:  None.  FINDINGS: Right Kidney:  Length: 10.2 cm. Echogenicity within normal limits. No mass or hydronephrosis visualized.  Left Kidney:  Length: 10.5 cm. Echogenicity within normal limits. No mass or hydronephrosis visualized.  Bladder:  Appears normal for degree of bladder distention.  IMPRESSION: No evidence for obstructive uropathy.   Electronically Signed   By: Elsie Stain M.D.   On: 10/09/2014 10:15   US Paracentesis  10/11/2014   CLINICAL DATA:  Chronic liver disease with recurrent ascites.  EXAM: ULTRASOUND GUIDED PARACENTESIS  COMPARISON:  10/09/2014  PROCEDURE: An ultrasound guided paracentesis was thoroughly discussed with the patient and questions answered. The benefits, risks, alternatives and complications were also discussed. The patient understands and wishes to proceed with the procedure. Written consent was obtained.  Ultrasound was performed to localize and mark an adequate pocket of fluid in the right lower quadrant of the abdomen. The area was then prepped and draped in the normal sterile fashion. 1% Lidocaine was used for local anesthesia. Under ultrasound guidance a 6 French Safe-T-Centesis catheter was introduced. Paracentesis was performed. The catheter was removed and a dressing  applied.  COMPLICATIONS: None.  FINDINGS: A total of approximately 3.7 L of clear, yellow fluid was removed.  IMPRESSION: Successful ultrasound guided paracentesis yielding 3.7 L of ascites.   Electronically Signed   By: Irish Lack M.D.   On: 10/11/2014 07:47   US Paracentesis  10/09/2014   CLINICAL DATA:  Primary sclerosing cholangitis, autoimmune hepatitis, recurrent ascites. Request is made for diagnostic only paracentesis with a 500 milliliter limit.  EXAM: ULTRASOUND GUIDED RIGHT LOWER QUADRANT PARACENTESIS  COMPARISON:  None.  PROCEDURE: An ultrasound guided paracentesis was thoroughly discussed with the patient and questions answered. The benefits, risks, alternatives and complications were also discussed. The patient understands and wishes to proceed with the procedure. Written consent was obtained.  Ultrasound was performed to localize and mark an adequate pocket of fluid in the right quadrant of the abdomen. The area was then prepped and draped in the  normal sterile fashion. 1% Lidocaine was used for local anesthesia. Under ultrasound guidance a 19 gauge Yueh catheter was introduced. Paracentesis was performed. The catheter was removed and a dressing applied.  COMPLICATIONS: None.  FINDINGS: A total of approximately 500 mls of clear yellow fluid was removed. A fluid sample was sent for laboratory analysis.  IMPRESSION: Successful ultrasound guided paracentesis yielding 500 mls of ascites.  Read by:  Corrin Parker, PA-C   Electronically Signed   By: Gilmer Mor D.O.   On: 10/09/2014 11:49   US Paracentesis  09/23/2014   INDICATION: Primary sclerosing cholangitis, autoimmune hepatitis, recurrent ascites. Request is made for diagnostic and therapeutic paracentesis.  EXAM: ULTRASOUND-GUIDED DIAGNOSTIC AND THERAPEUTIC PARACENTESIS  COMPARISON:  Prior paracentesis on 09/05/2014  MEDICATIONS: None.  COMPLICATIONS: None immediate  TECHNIQUE: Informed written consent was obtained from the patient after a  discussion of the risks, benefits and alternatives to treatment. A timeout was performed prior to the initiation of the procedure.  Initial ultrasound scanning demonstrates a moderate amount of ascites within the right lower abdominal quadrant. The right lower abdomen was prepped and draped in the usual sterile fashion. 1% lidocaine was used for local anesthesia. Under direct ultrasound guidance, a 19 gauge, 10-cm, Yueh catheter was introduced. An ultrasound image was saved for documentation purposed. The paracentesis was performed. The catheter was removed and a dressing was applied. The patient tolerated the procedure well without immediate post procedural complication.  FINDINGS: A total of approximately 3 liters of slightly turbid, yellow fluid was removed. Samples were sent to the laboratory as requested by the clinical team.  IMPRESSION: Successful ultrasound-guided diagnostic and therapeutic paracentesis yielding 3 liters of peritoneal fluid. The patient will receive IV albumin infusion postprocedure.  Read by: Jeananne Rama, PA-C   Electronically Signed   By: Gilmer Mor D.O.   On: 09/22/2014 20:12   Dg Chest Port 1 View  09/12/2014   CLINICAL DATA:  Central line placement.  EXAM: PORTABLE CHEST - 1 VIEW  COMPARISON:  09/12/2014 at 5:07 a.m.  FINDINGS: Interval removal of endotracheal tube. Right IJ central venous catheter has been pulled back and tip over the SVC below the level of the head of the right clavicle and 2.5 cm above the carina.  Lungs are hypoinflated and otherwise clear. Cardiomediastinal silhouette and remainder of the exam is unchanged.  IMPRESSION: Hypoinflation without acute cardiopulmonary disease.  Right IJ central venous catheter has been pulled back with tip over the SVC just below the head of the right clavicle.  These results were called by telephone at the time of interpretation on 09/12/2014 at 9:09 pm to patient's nurse, May Aninon, who verbally acknowledged these results.    Electronically Signed   By: Elberta Fortis M.D.   On: 09/12/2014 21:10   Dg Abd 2 Views  10/09/2014   CLINICAL DATA:  Epigastric abdominal pain, vomiting, patient reports history of liver disease  EXAM: ABDOMEN - 2 VIEW  COMPARISON:  CT 09/05/2014  FINDINGS: The bowel gas pattern is normal. There is no evidence of free air. No radio-opaque calculi or other significant radiographic abnormality is seen. Mild stool burden. Central displacement of bowel loops likely reflects ascites.  IMPRESSION: Normal bowel gas pattern but central displacement of bowel loops may indicate ascites.   Electronically Signed   By: Christiana Pellant M.D.   On: 10/09/2014 09:28   US Abdomen Limited Ruq  10/09/2014   CLINICAL DATA:  Primary biliary cirrhosis.  Abdominal pain  EXAM: US  ABDOMEN LIMITED - RIGHT UPPER QUADRANT  COMPARISON:  None.  FINDINGS: Gallbladder:  No gallstones or wall thickening visualized. No sonographic Murphy sign noted.  Common bile duct:  Diameter: 3.8 mm.  Liver:  The liver appears atrophic and cirrhotic. No focal lesion identified.  Other:  Marked abdominal ascites.  IMPRESSION: 1. No acute findings. 2. Cirrhosis 3. Ascites.   Electronically Signed   By: Signa Kell M.D.   On: 10/09/2014 10:10     PERTINENT LAB RESULTS: CBC:  Recent Labs  10/11/14 0240 10/12/14 0508  WBC 2.3* 2.2*  HGB 8.5* 9.0*  HCT 24.6* 26.6*  PLT 51* 69*   CMET CMP     Component Value Date/Time   NA 131* 10/12/2014 0508   K 4.2 10/12/2014 0508   CL 97* 10/12/2014 0508   CO2 28 10/12/2014 0508   GLUCOSE 95 10/12/2014 0508   BUN 8 10/12/2014 0508   CREATININE 0.75 10/12/2014 0508   CALCIUM 8.8* 10/12/2014 0508   PROT 4.9* 10/11/2014 0240   ALBUMIN 3.1* 10/11/2014 0240   AST 22 10/11/2014 0240   ALT 14 10/11/2014 0240   ALKPHOS 175* 10/11/2014 0240   BILITOT 9.5* 10/11/2014 0240   GFRNONAA >60 10/12/2014 0508   GFRAA >60 10/12/2014 0508    GFR Estimated Creatinine Clearance: 74.6 mL/min (by C-G formula  based on Cr of 0.75). No results for input(s): LIPASE, AMYLASE in the last 72 hours. No results for input(s): CKTOTAL, CKMB, CKMBINDEX, TROPONINI in the last 72 hours. Invalid input(s): POCBNP No results for input(s): DDIMER in the last 72 hours. No results for input(s): HGBA1C in the last 72 hours. No results for input(s): CHOL, HDL, LDLCALC, TRIG, CHOLHDL, LDLDIRECT in the last 72 hours. No results for input(s): TSH, T4TOTAL, T3FREE, THYROIDAB in the last 72 hours.  Invalid input(s): FREET3 No results for input(s): VITAMINB12, FOLATE, FERRITIN, TIBC, IRON, RETICCTPCT in the last 72 hours. Coags:  Recent Labs  10/11/14 0240  INR 1.95*   Microbiology: Recent Results (from the past 240 hour(s))  Culture, blood (routine x 2)     Status: None (Preliminary result)   Collection Time: 10/09/14  8:35 AM  Result Value Ref Range Status   Specimen Description BLOOD RIGHT ARM  Final   Special Requests BOTTLES DRAWN AEROBIC ONLY 3CC  Final   Culture NO GROWTH 2 DAYS  Final   Report Status PENDING  Incomplete  Culture, blood (routine x 2)     Status: None (Preliminary result)   Collection Time: 10/09/14  8:45 AM  Result Value Ref Range Status   Specimen Description BLOOD RIGHT HAND  Final   Special Requests BOTTLES DRAWN AEROBIC ONLY 4CC  Final   Culture NO GROWTH 2 DAYS  Final   Report Status PENDING  Incomplete  MRSA PCR Screening     Status: None   Collection Time: 10/09/14 10:13 AM  Result Value Ref Range Status   MRSA by PCR NEGATIVE NEGATIVE Final    Comment:        The GeneXpert MRSA Assay (FDA approved for NASAL specimens only), is one component of a comprehensive MRSA colonization surveillance program. It is not intended to diagnose MRSA infection nor to guide or monitor treatment for MRSA infections.   Culture, body fluid-bottle     Status: None (Preliminary result)   Collection Time: 10/09/14 11:34 AM  Result Value Ref Range Status   Specimen Description FLUID  PERITONEAL  Final   Special Requests NONE  Final  Culture NO GROWTH 2 DAYS  Final   Report Status PENDING  Incomplete  Gram stain     Status: None   Collection Time: 10/09/14 11:34 AM  Result Value Ref Range Status   Specimen Description FLUID PERITONEAL  Final   Special Requests NONE  Final   Gram Stain NO WBC SEEN NO ORGANISMS SEEN   Final   Report Status 10/09/2014 FINAL  Final     BRIEF HOSPITAL COURSE:  Principle Problem:  Abdomen Pain: Initially SBP suspected, but ascitic fluid shows no evidence of SBP.Culture shows no growth to date. Significant relief of pain post paracentesis suggests etiology of tense ascites. Pain currently resolved. No pain medication given on d/c.   Active Problems:  Ascites: Etiology is autoimmune hepatitis/PSC. No SBP per paracentesis. Paracentesis x2 in hospital completed. Renal function significantly improved. BP stable on full dose at-home diuretics x1 day. Plan is to resume at-home dose of Spironolactone and Lasix on d/c.   Anemia: Hgb continue to improve (currently 9.0). Suspected etiology is critical illness- no evidence of active bleeding during hospital stay. BUN stable. Plan is to continue PPI on d/c. Educated patient on symptoms of anemia and when to seek care.   Acute renal failure: Suspected etiology pre-renal azotemia. Renal US shows no evidence of obstructive uropathy. Currently stable. See ascites section for diuretic plans.   Hypokalemia: Potassium stable. No treatment needed on d/c.   Hyponatremia: Mild, suspected etiology from cirrhosis. 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 Continue Imuran, prednisone and ursodiol on d/c.   History of SBP: Continue Bactrim for SBP prophylaxis on discharge.   History of hepatic encephalopathy: Patient awake/alert. Continue with lactulose and  rifaximin on d/c.  History of portal vein thrombosis: Recently diagnosed in [facility]-not a candidate for anticoagulation per discharge summary from [facility]. FU w/ [facility] for management on d/c.   History of variceal bleeding: Reviewed last discharge summary from [facility]-patient most recently had an endoscopy with banding on 8/14 (here at [facility])-unfortunately, she was diagnosed with a portal vein thrombosis-and could not proceed with BRTO.No hx of melena or hematemesis. Continue PPI and resume beta blocker on d/c.  History of CMV viremia: Continue with valganciclovir for prophylaxis on d/c.  TODAY-DAY OF DISCHARGE:  Subjective:   Melanie Conley appears to be much better today. She complains of no abdominal pain or tenderness. Expresses relief followin paracentesis and a desire to go home.   Objective:   Blood pressure 102/64, pulse 102, temperature 97.8 F (36.6 C), temperature source Oral, resp. rate 17, height  (1.499 m), weight 45.587 kg (100 lb 8 oz), SpO2 99 %.  Intake/Output Summary (Last 24 hours) at 10/12/14 1149 Last data filed at 10/12/14 0625  Gross per 24 hour  Intake   1170 ml  Output      0 ml  Net   1170 ml   Filed Weights   10/09/14 0800 10/09/14 0900 10/12/14 0121  Weight: 45 kg (99 lb 3.3 oz) 50.6 kg (111 lb 8.8 oz) 45.587 kg (100 lb 8 oz)    Exam Awake Alert, Oriented *3, No new F.N deficits, Normal affect [place].AT,PERRAL Supple Neck,No JVD, No cervical lymphadenopathy appriciated.  Symmetrical Chest wall movement, Good air movement bilaterally, CTAB RRR,No Gallops,Rubs or new Murmurs, No Parasternal Heave +ve B.Sounds, Abd Soft, Non tender, No organomegaly appriciated, No rebound -guarding or rigidity. No Cyanosis, Clubbing or edema,  No new Rash or bruise  DISCHARGE CONDITION: Stable  DISPOSITION: Home  DISCHARGE INSTRUCTIONS:    Activity:  As tolerated, PT recommends no assistance or FU needed   Get Medicines reviewed and adjusted: Please take all your  medications with you for your next visit with your Primary MD  Please request your Primary MD to go over all hospital tests and procedure/radiological results at the follow up, please ask your Primary MD to get all Hospital records sent to his/her office.  If you experience worsening of your admission symptoms, develop shortness of breath, life threatening emergency, suicidal or homicidal thoughts you must seek medical attention immediately by calling 911 or calling your MD immediately  if symptoms less severe.  You must read complete instructions/literature along with all the possible adverse reactions/side effects for all the Medicines you take and that have been prescribed to you. Take any new Medicines after you have completely understood and accpet all the possible adverse reactions/side effects.   Do not drive when taking Pain medications.   Do not take more than prescribed Pain, Sleep and Anxiety Medications  Special Instructions: If you have smoked or chewed Tobacco  in the last 2 yrs please stop smoking, stop any regular Alcohol  and or any Recreational drug use.  Wear Seat belts while driving.  Please note  You were cared for by a hospitalist during your hospital stay. Once you are discharged, your primary care physician will handle any further medical issues. Please note that NO REFILLS for any discharge medications will be authorized once you are discharged, as it is imperative that you return to your primary care physician (or establish a relationship with a primary care physician if you do not have one) for your aftercare needs so that they can reassess your need for medications and monitor your lab values.   Diet recommendation: Low sodium, heart healthy diet  Fluid restriction 1.2 lit/day   Discharge Instructions    Call MD for:  difficulty breathing, headache or visual disturbances    Complete by:  As directed      Call MD for:  severe uncontrolled pain    Complete by:   As directed      Diet - low sodium heart healthy    Complete by:  As directed      Increase activity slowly    Complete by:  As directed            Follow-up Information    Follow up with [name],[name], MD. Schedule an appointment as soon as possible for a visit in 1 week.   Specialty:  Internal Medicine      Total Time spent on discharge equals 45 minutes.  SignedJeoffrey Massed, MD Jeoffrey Massed 10/12/2014 11:49 AM  Attending MD note  Patient was seen, examined,treatment plan was discussed with the PA-S.  I have personally reviewed the clinical findings, lab, imaging studies and management of this patient in detail. I agree with the documentation, as recorded by PA-S.  Patient is significantly improved, no significant abdominal pain, anxious to be discharged. We will increase her diuretics back to her usual dosing and continue most of her medications. Stable for discharge.   [facility]

## 2014-10-14 LAB — CULTURE, BODY FLUID-BOTTLE: CULTURE: NO GROWTH

## 2014-10-14 LAB — CULTURE, BLOOD (ROUTINE X 2)
CULTURE: NO GROWTH
Culture: NO GROWTH

## 2014-10-14 LAB — CULTURE, BODY FLUID W GRAM STAIN -BOTTLE

## 2014-11-17 ENCOUNTER — Emergency Department (HOSPITAL_COMMUNITY)
Admission: EM | Admit: 2014-11-17 | Discharge: 2014-11-17 | Disposition: A | Discharge status: Home / Self Care | Payer: BC Managed Care – PPO | Attending: Emergency Medicine | Admitting: Emergency Medicine

## 2014-11-17 ENCOUNTER — Encounter (HOSPITAL_COMMUNITY): Payer: Self-pay | Admitting: Emergency Medicine

## 2014-11-17 DIAGNOSIS — Z7952 Long term (current) use of systemic steroids: Secondary | ICD-10-CM | POA: Insufficient documentation

## 2014-11-17 DIAGNOSIS — Z880 Allergy status to penicillin: Secondary | ICD-10-CM | POA: Insufficient documentation

## 2014-11-17 DIAGNOSIS — Z8679 Personal history of other diseases of the circulatory system: Secondary | ICD-10-CM | POA: Insufficient documentation

## 2014-11-17 DIAGNOSIS — Z79899 Other long term (current) drug therapy: Secondary | ICD-10-CM | POA: Insufficient documentation

## 2014-11-17 DIAGNOSIS — R1030 Lower abdominal pain, unspecified: Secondary | ICD-10-CM | POA: Diagnosis present

## 2014-11-17 DIAGNOSIS — R17 Unspecified jaundice: Secondary | ICD-10-CM | POA: Insufficient documentation

## 2014-11-17 DIAGNOSIS — Z862 Personal history of diseases of the blood and blood-forming organs and certain disorders involving the immune mechanism: Secondary | ICD-10-CM | POA: Diagnosis not present

## 2014-11-17 DIAGNOSIS — R110 Nausea: Secondary | ICD-10-CM | POA: Insufficient documentation

## 2014-11-17 DIAGNOSIS — Z8719 Personal history of other diseases of the digestive system: Secondary | ICD-10-CM | POA: Diagnosis not present

## 2014-11-17 DIAGNOSIS — Z87891 Personal history of nicotine dependence: Secondary | ICD-10-CM | POA: Insufficient documentation

## 2014-11-17 DIAGNOSIS — Z792 Long term (current) use of antibiotics: Secondary | ICD-10-CM | POA: Insufficient documentation

## 2014-11-17 DIAGNOSIS — R1084 Generalized abdominal pain: Secondary | ICD-10-CM | POA: Diagnosis not present

## 2014-11-17 DIAGNOSIS — Z3202 Encounter for pregnancy test, result negative: Secondary | ICD-10-CM | POA: Insufficient documentation

## 2014-11-17 DIAGNOSIS — Z8619 Personal history of other infectious and parasitic diseases: Secondary | ICD-10-CM | POA: Diagnosis not present

## 2014-11-17 LAB — URINALYSIS, ROUTINE W REFLEX MICROSCOPIC
Bilirubin Urine: NEGATIVE
Glucose, UA: NEGATIVE mg/dL
Hgb urine dipstick: NEGATIVE
Ketones, ur: NEGATIVE mg/dL
NITRITE: NEGATIVE
PH: 6 (ref 5.0–8.0)
Protein, ur: NEGATIVE mg/dL
SPECIFIC GRAVITY, URINE: 1.005 (ref 1.005–1.030)
Urobilinogen, UA: 1 mg/dL (ref 0.0–1.0)

## 2014-11-17 LAB — COMPREHENSIVE METABOLIC PANEL
ALBUMIN: 2.7 g/dL — AB (ref 3.5–5.0)
ALT: 42 U/L (ref 14–54)
AST: 48 U/L — AB (ref 15–41)
Alkaline Phosphatase: 284 U/L — ABNORMAL HIGH (ref 38–126)
Anion gap: 7 (ref 5–15)
BUN: 12 mg/dL (ref 6–20)
CHLORIDE: 103 mmol/L (ref 101–111)
CO2: 21 mmol/L — ABNORMAL LOW (ref 22–32)
Calcium: 8.4 mg/dL — ABNORMAL LOW (ref 8.9–10.3)
Creatinine, Ser: 0.98 mg/dL (ref 0.44–1.00)
GFR calc Af Amer: >60 mL/min (ref >60)
GFR calc non Af Amer: >60 mL/min (ref >60)
GLUCOSE: 114 mg/dL — AB (ref 65–99)
POTASSIUM: 4.6 mmol/L (ref 3.5–5.1)
Sodium: 131 mmol/L — ABNORMAL LOW (ref 135–145)
Total Bilirubin: 7.7 mg/dL — ABNORMAL HIGH (ref 0.3–1.2)
Total Protein: 5.8 g/dL — ABNORMAL LOW (ref 6.5–8.1)

## 2014-11-17 LAB — POC URINE PREG, ED: Preg Test, Ur: NEGATIVE

## 2014-11-17 LAB — URINE MICROSCOPIC-ADD ON

## 2014-11-17 LAB — CBC
HEMATOCRIT: 30 % — AB (ref 36.0–46.0)
HEMOGLOBIN: 10 g/dL — AB (ref 12.0–15.0)
MCH: 33.2 pg (ref 26.0–34.0)
MCHC: 33.3 g/dL (ref 30.0–36.0)
MCV: 99.7 fL (ref 78.0–100.0)
Platelets: 77 10*3/uL — ABNORMAL LOW (ref 150–400)
RBC: 3.01 MIL/uL — ABNORMAL LOW (ref 3.87–5.11)
RDW: 18.6 % — AB (ref 11.5–15.5)
WBC: 3.9 10*3/uL — AB (ref 4.0–10.5)

## 2014-11-17 LAB — AMMONIA: Ammonia: 96 umol/L — ABNORMAL HIGH (ref 9–35)

## 2014-11-17 LAB — LIPASE, BLOOD: LIPASE: 39 U/L (ref 11–51)

## 2014-11-17 MED ORDER — OXYCODONE HCL 5 MG PO TABS
5.0000 mg | ORAL_TABLET | ORAL | Status: DC | PRN
Start: 2014-11-17 — End: 2015-02-06

## 2014-11-17 MED ORDER — ONDANSETRON 4 MG PO TBDP: ORAL_TABLET | ORAL | Status: DC

## 2014-11-17 MED ORDER — GI COCKTAIL ~~LOC~~
30.0000 mL | Freq: Once | ORAL | Status: AC
  Administered 2014-11-17: 30 mL via ORAL
  Filled 2014-11-17: qty 30

## 2014-11-17 MED ORDER — MORPHINE SULFATE (PF) 4 MG/ML IV SOLN
4.0000 mg | Freq: Once | INTRAVENOUS | Status: AC
  Administered 2014-11-17: 4 mg via INTRAVENOUS
  Filled 2014-11-17: qty 1

## 2014-11-17 NOTE — ED Notes (Signed)
IV attempt X2 

## 2014-11-17 NOTE — ED Provider Notes (Signed)
CSN: [id number]   Arrival date & time 11/17/14 0215  History  By signing my name below, I, Bethel Born, attest that this documentation has been prepared under the direction and in the presence of Loren Racer, MD. Electronically Signed: Bethel Born, ED Scribe. 11/17/2014. 4:50 AM.  Chief Complaint  Patient presents with  . Abdominal Pain    HPI The history is provided by the patient. No language interpreter was used.   Melanie Conley is a 24 y.o. female with PMHx of cirrhosis, autoimmune hepatitis, esophageal varices, ascites, hepatic encephalopathy,  and multiple STDs who presents to the Emergency Department complaining of mid/lower abdominal pain with sudden onset yesterday around 3 PM. She describes the pain as sharp and similar to pain that she has had in the past. Pt states that she takes Lidocaine for the pain but has been out for 1 month. Associated symptoms include nausea. Pt denies fever, chills,  increased abdominal distension (stating that the abdomen is not as swollen as usual), increased yellowing of the eyes, dysuria, vomiting, and diarrhea. Last bowel movement was 3 hours ago and it was normal.   Past Medical History  Diagnosis Date  . Anemia 2012  . Esophageal varices (HCC) 2015    s/p multiple EGDs and bandings dating to 2015.   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 GIB (gastrointestinal bleeding)   . Cirrhosis (HCC) 2003  . Irregular periods 08/04/2014  . Vaginal Pap smear, abnormal   . Hepatic encephalopathy (HCC)   . Coagulopathy (HCC)   . STD (female)     mutiple. risky sexual behavior.   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 Ascites 2003    Past Surgical History  Procedure Laterality Date  . Gastric banding port revision    . Tonsillectomy    . Esophagogastroduodenoscopy  07/22/2011    Procedure: ESOPHAGOGASTRODUODENOSCOPY (EGD);  Surgeon: Graylin Shiver, MD;  Location: [facility] ENDOSCOPY;   Service: Endoscopy;  Laterality: N/A;  . Esophagogastroduodenoscopy N/A 09/11/2014    Procedure: ESOPHAGOGASTRODUODENOSCOPY (EGD);  Surgeon: Charlott Rakes, MD;  Location: [facility] ENDOSCOPY;  Service: Endoscopy;  Laterality: N/A;    Family History  Problem Relation Age of Onset  . Adopted: Yes    Social History  Substance Use Topics  . Smoking status: Former Smoker -- 0.00 packs/day for 0 years  . Smokeless tobacco: Never Used  . Alcohol Use: No     Review of Systems  Constitutional: Negative for fever and chills.  Respiratory: Negative for shortness of breath.   Cardiovascular: Negative for chest pain.  Gastrointestinal: Positive for nausea and abdominal pain. Negative for vomiting, diarrhea, constipation and blood in stool.  Genitourinary: Negative for dysuria, frequency, flank pain, vaginal bleeding, vaginal discharge and pelvic pain.  Musculoskeletal: Negative for back pain, neck pain and neck stiffness.  Skin: Negative for rash and wound.  Neurological: Negative for dizziness, weakness, light-headedness, numbness and headaches.  All other systems reviewed and are negative.  Home Medications   Prior to Admission medications   Medication Sig Start Date End Date Taking? Authorizing Provider  azaTHIOprine (IMURAN) 50 MG tablet Take 25 mg by mouth daily.   Yes Historical Provider, MD  carvedilol (COREG) 3.125 MG tablet Take 6.25 mg by mouth daily.   Yes Historical Provider, MD  furosemide (LASIX) 20 MG tablet Take 2 tablets (40 mg total) by mouth daily. 10/12/14  Yes Shanker Levora Dredge, MD  lactulose (CHRONULAC) 10 GM/15ML  solution Take 30 mLs (20 g total) by mouth 2 (two) times daily. Patient taking differently: Take 20 g by mouth 3 (three) times daily.  09/13/14  Yes Vilinda BlanksWilliam S Minor, NP  Lidocaine HCl 2 % SOLN Take 200 mg by mouth 3 (three) times daily. For gi upset: 10 mls (200 mg) each dose   Yes Historical Provider, MD  magnesium oxide (MAG-OX) 400 MG tablet Take 400 mg by mouth  daily.   Yes Historical Provider, MD  metoCLOPramide (REGLAN) 5 MG tablet Take 5 mg by mouth 3 (three) times daily before meals. 30 minutes before each meal   Yes Historical Provider, MD  omeprazole (PRILOSEC) 40 MG capsule Take 40 mg by mouth daily.   Yes Historical Provider, MD  ondansetron (ZOFRAN) 4 MG tablet Take 1 tablet (4 mg total) by mouth every 8 (eight) hours as needed for nausea or vomiting. 10/12/14  Yes Shanker Levora DredgeM Ghimire, MD  predniSONE (DELTASONE) 5 MG tablet Take 5 mg by mouth daily with breakfast.   Yes Historical Provider, MD  rifaximin (XIFAXAN) 550 MG TABS tablet Take 1 tablet (550 mg total) by mouth 2 (two) times daily. 09/13/14  Yes Vilinda BlanksWilliam S Minor, NP  spironolactone (ALDACTONE) 50 MG tablet Take 150 mg by mouth daily.   Yes Historical Provider, MD  sulfamethoxazole-trimethoprim (BACTRIM DS,SEPTRA DS) 800-160 MG per tablet Take 1 tablet by mouth daily.   Yes Historical Provider, MD  ursodiol (ACTIGALL) 300 MG capsule Take 600 mg by mouth daily.   Yes Historical Provider, MD  valGANciclovir (VALCYTE) 450 MG tablet Take 2 tablets (900 mg total) by mouth 2 (two) times daily. 10/12/14  Yes Shanker Levora DredgeM Ghimire, MD  ondansetron (ZOFRAN ODT) 4 MG disintegrating tablet 4mg  ODT q4 hours prn nausea/vomit 11/17/14   Loren Raceravid Shrita Thien, MD  oxyCODONE (ROXICODONE) 5 MG immediate release tablet Take 1 tablet (5 mg total) by mouth every 4 (four) hours as needed for severe pain. 11/17/14   Loren Raceravid Delsie Amador, MD    Allergies  Amoxicillin  Triage Vitals: BP 114/69 mmHg  Pulse 99  Temp(Src) 98.2 F (36.8 C) (Oral)  Resp 16  Wt 107 lb 14.4 oz (48.943 kg)  SpO2 98%  Physical Exam  Constitutional: She is oriented to person, place, and time. She appears well-developed and well-nourished. No distress.  HENT:  Head: Normocephalic and atraumatic.  Mouth/Throat: Oropharynx is clear and moist.  Eyes: EOM are normal. Pupils are equal, round, and reactive to light. Scleral icterus is present.  Neck:  Normal range of motion. Neck supple.  Cardiovascular: Normal rate and regular rhythm.   Pulmonary/Chest: Effort normal and breath sounds normal. No respiratory distress. She has no wheezes. She has no rales. She exhibits no tenderness.  Abdominal: Soft. Bowel sounds are normal. She exhibits distension (abdominal distention with periumbilical hernia which is reducible). There is tenderness (Diffuse abdominal tenderness without focality. No rebound or guarding.).  Musculoskeletal: Normal range of motion. She exhibits no edema or tenderness.  No CVA tenderness bilaterally.  Neurological: She is alert and oriented to person, place, and time.  Skin: Skin is warm and dry. No rash noted. No erythema.  Psychiatric: She has a normal mood and affect. Her behavior is normal.  Nursing note and vitals reviewed.   ED Course  Procedures   DIAGNOSTIC STUDIES: Oxygen Saturation is 98% on RA, normal by my interpretation.    COORDINATION OF CARE: 3:04 AM Discussed treatment plan which includes lab work, morphine, and a GI cocktail with pt at bedside  and pt agreed to plan.  Labs Reviewed  COMPREHENSIVE METABOLIC PANEL - Abnormal; Notable for the following:    Sodium 131 (*)    CO2 21 (*)    Glucose, Bld 114 (*)    Calcium 8.4 (*)    Total Protein 5.8 (*)    Albumin 2.7 (*)    AST 48 (*)    Alkaline Phosphatase 284 (*)    Total Bilirubin 7.7 (*)    All other components within normal limits  CBC - Abnormal; Notable for the following:    WBC 3.9 (*)    RBC 3.01 (*)    Hemoglobin 10.0 (*)    HCT 30.0 (*)    RDW 18.6 (*)    Platelets 77 (*)    All other components within normal limits  URINALYSIS, ROUTINE W REFLEX MICROSCOPIC (NOT AT [facility]) - Abnormal; Notable for the following:    APPearance CLOUDY (*)    Leukocytes, UA MODERATE (*)    All other components within normal limits  URINE MICROSCOPIC-ADD ON - Abnormal; Notable for the following:    Squamous Epithelial / LPF MANY (*)    Bacteria, UA  FEW (*)    All other components within normal limits  AMMONIA - Abnormal; Notable for the following:    Ammonia 96 (*)    All other components within normal limits  LIPASE, BLOOD  POC URINE PREG, ED    Imaging Review No results found.  I personally reviewed and evaluated these images and lab results as a part of my medical decision-making.    MDM   Final diagnoses:  Generalized abdominal pain     I, Kelcey Wickstrom, personally performed the services described in this documentation. All medical record entries made by the scribe were at my direction and in my presence.  I have reviewed the chart and discharge instructions and agree that the record reflects my personal performance and is accurate and complete. Analis Distler.  11/17/2014. 4:50 AM.   Patient states her abdominal pain is completely resolved. Labs appear to be at their baseline. She is requesting a sandwich. We'll discharge home with short supply of pain medication to follow-up with her primary physician. Return precautions given.   Loren Racer, MD 11/17/14 [phone]

## 2014-11-17 NOTE — Discharge Instructions (Signed)

## 2014-11-17 NOTE — ED Notes (Signed)
Pt. report mid/low abdominal pain onset yesterday with nausea , denies emesis or diarrhea , no urinary discomfort or fever.

## 2014-11-19 ENCOUNTER — Encounter (HOSPITAL_COMMUNITY): Payer: Self-pay

## 2014-11-19 ENCOUNTER — Emergency Department (HOSPITAL_COMMUNITY)
Admission: EM | Admit: 2014-11-19 | Discharge: 2014-11-20 | Disposition: A | Discharge status: Home / Self Care | Payer: BC Managed Care – PPO | Attending: Internal Medicine | Admitting: Internal Medicine

## 2014-11-19 DIAGNOSIS — D689 Coagulation defect, unspecified: Secondary | ICD-10-CM | POA: Diagnosis present

## 2014-11-19 DIAGNOSIS — K754 Autoimmune hepatitis: Secondary | ICD-10-CM | POA: Diagnosis present

## 2014-11-19 DIAGNOSIS — R109 Unspecified abdominal pain: Secondary | ICD-10-CM | POA: Diagnosis present

## 2014-11-19 DIAGNOSIS — R188 Other ascites: Secondary | ICD-10-CM | POA: Diagnosis not present

## 2014-11-19 DIAGNOSIS — I959 Hypotension, unspecified: Secondary | ICD-10-CM | POA: Diagnosis present

## 2014-11-19 DIAGNOSIS — K8301 Primary sclerosing cholangitis: Secondary | ICD-10-CM | POA: Diagnosis present

## 2014-11-19 DIAGNOSIS — R161 Splenomegaly, not elsewhere classified: Secondary | ICD-10-CM

## 2014-11-19 DIAGNOSIS — K830 Cholangitis: Secondary | ICD-10-CM | POA: Insufficient documentation

## 2014-11-19 DIAGNOSIS — K7682 Hepatic encephalopathy: Secondary | ICD-10-CM | POA: Diagnosis present

## 2014-11-19 DIAGNOSIS — R1013 Epigastric pain: Secondary | ICD-10-CM

## 2014-11-19 DIAGNOSIS — K7460 Unspecified cirrhosis of liver: Secondary | ICD-10-CM | POA: Diagnosis present

## 2014-11-19 DIAGNOSIS — D61818 Other pancytopenia: Secondary | ICD-10-CM | POA: Diagnosis present

## 2014-11-19 DIAGNOSIS — K743 Primary biliary cirrhosis: Secondary | ICD-10-CM

## 2014-11-19 DIAGNOSIS — K7290 Hepatic failure, unspecified without coma: Secondary | ICD-10-CM | POA: Diagnosis present

## 2014-11-19 LAB — CBC
HEMATOCRIT: 29.9 % — AB (ref 36.0–46.0)
HEMOGLOBIN: 10 g/dL — AB (ref 12.0–15.0)
MCH: 33.3 pg (ref 26.0–34.0)
MCHC: 33.4 g/dL (ref 30.0–36.0)
MCV: 99.7 fL (ref 78.0–100.0)
Platelets: 58 10*3/uL — ABNORMAL LOW (ref 150–400)
RBC: 3 MIL/uL — AB (ref 3.87–5.11)
RDW: 17.9 % — ABNORMAL HIGH (ref 11.5–15.5)
WBC: 3 10*3/uL — AB (ref 4.0–10.5)

## 2014-11-19 LAB — I-STAT BETA HCG BLOOD, ED (MC, WL, AP ONLY)

## 2014-11-19 LAB — COMPREHENSIVE METABOLIC PANEL
ALBUMIN: 2.6 g/dL — AB (ref 3.5–5.0)
ALT: 59 U/L — ABNORMAL HIGH (ref 14–54)
ANION GAP: 8 (ref 5–15)
AST: 87 U/L — ABNORMAL HIGH (ref 15–41)
Alkaline Phosphatase: 373 U/L — ABNORMAL HIGH (ref 38–126)
BILIRUBIN TOTAL: 6.3 mg/dL — AB (ref 0.3–1.2)
BUN: 9 mg/dL (ref 6–20)
CO2: 24 mmol/L (ref 22–32)
Calcium: 8.5 mg/dL — ABNORMAL LOW (ref 8.9–10.3)
Chloride: 99 mmol/L — ABNORMAL LOW (ref 101–111)
Creatinine, Ser: 0.72 mg/dL (ref 0.44–1.00)
GLUCOSE: 104 mg/dL — AB (ref 65–99)
POTASSIUM: 3.3 mmol/L — AB (ref 3.5–5.1)
Sodium: 131 mmol/L — ABNORMAL LOW (ref 135–145)
TOTAL PROTEIN: 5.6 g/dL — AB (ref 6.5–8.1)

## 2014-11-19 LAB — URINALYSIS, ROUTINE W REFLEX MICROSCOPIC
Bilirubin Urine: NEGATIVE
Glucose, UA: NEGATIVE mg/dL
Hgb urine dipstick: NEGATIVE
Ketones, ur: NEGATIVE mg/dL
Nitrite: NEGATIVE
PROTEIN: NEGATIVE mg/dL
Specific Gravity, Urine: 1.008 (ref 1.005–1.030)
UROBILINOGEN UA: 1 mg/dL (ref 0.0–1.0)
pH: 5 (ref 5.0–8.0)

## 2014-11-19 LAB — GLUCOSE, PERITONEAL FLUID: Glucose, Peritoneal Fluid: 104 mg/dL

## 2014-11-19 LAB — PROTIME-INR
INR: 1.21 (ref 0.00–1.49)
PROTHROMBIN TIME: 15.5 s — AB (ref 11.6–15.2)

## 2014-11-19 LAB — URINE MICROSCOPIC-ADD ON

## 2014-11-19 LAB — BODY FLUID CELL COUNT WITH DIFFERENTIAL
Lymphs, Fluid: 25 %
MONOCYTE-MACROPHAGE-SEROUS FLUID: 68 % (ref 50–90)
NEUTROPHIL FLUID: 7 % (ref 0–25)
WBC FLUID: 18 uL (ref 0–1000)

## 2014-11-19 LAB — LIPASE, BLOOD: Lipase: 45 U/L (ref 11–51)

## 2014-11-19 MED ORDER — FENTANYL CITRATE (PF) 100 MCG/2ML IJ SOLN
50.0000 ug | Freq: Once | INTRAMUSCULAR | Status: AC
  Administered 2014-11-19: 50 ug via INTRAVENOUS
  Filled 2014-11-19: qty 2

## 2014-11-19 MED ORDER — SODIUM CHLORIDE 0.9 % IV BOLUS (SEPSIS)
1000.0000 mL | Freq: Once | INTRAVENOUS | Status: AC
  Administered 2014-11-19: 1000 mL via INTRAVENOUS

## 2014-11-19 MED ORDER — LIDOCAINE-EPINEPHRINE (PF) 2 %-1:200000 IJ SOLN
10.0000 mL | Freq: Once | INTRAMUSCULAR | Status: AC
  Administered 2014-11-19: 10 mL via INTRADERMAL
  Filled 2014-11-19: qty 20

## 2014-11-19 MED ORDER — SODIUM CHLORIDE 0.9 % IV BOLUS (SEPSIS)
500.0000 mL | Freq: Once | INTRAVENOUS | Status: AC
  Administered 2014-11-20: 500 mL via INTRAVENOUS

## 2014-11-19 MED ORDER — CEFTRIAXONE SODIUM 2 G IJ SOLR
2.0000 g | Freq: Once | INTRAMUSCULAR | Status: AC
Start: 2014-11-19 — End: 2014-11-20
  Administered 2014-11-19: 2 g via INTRAVENOUS
  Filled 2014-11-19: qty 2

## 2014-11-19 MED ORDER — FENTANYL CITRATE (PF) 100 MCG/2ML IJ SOLN
50.0000 ug | Freq: Once | INTRAMUSCULAR | Status: AC
  Administered 2014-11-20: 50 ug via INTRAVENOUS
  Filled 2014-11-19: qty 2

## 2014-11-19 MED ORDER — METRONIDAZOLE 500 MG PO TABS
500.0000 mg | ORAL_TABLET | Freq: Once | ORAL | Status: AC
  Administered 2014-11-19: 500 mg via ORAL
  Filled 2014-11-19: qty 1

## 2014-11-19 NOTE — ED Notes (Signed)
EMS VS: HR-92, BP-102/72, RR-18, 98% RA.

## 2014-11-19 NOTE — ED Notes (Signed)
PER EMS: pt from home with reports of generalized abdominal pain onset this morning. Abdomen is warm and tender upon palpation and distended. Pt has liver cirrhosis and is requesting paracentesis be done today. Flare ups about once a month.

## 2014-11-19 NOTE — ED Provider Notes (Signed)
CSN: [id number]     Arrival date & time 11/19/14  1759 History   First MD Initiated Contact with Patient 11/19/14 1940     Chief Complaint  Patient presents with  . Abdominal Pain     (Consider location/radiation/quality/duration/timing/severity/associated sxs/prior Treatment) HPI Comments: Pt comes in with cc of abd pain. Pt has hx of PSC with liver cirrhosis. She reports that her current abd pain started few days back, improved, but returned today. The pain is located in the epigastrium and upper quadrants. Pain is not radiating, but it is severe. She has no n/v/f/c/diarrhea/bloody stools. Pt has had paracenthesis in the past, last one in Sept. She sees [facility] for her liver cirrhosis.   ROS 10 Systems reviewed and are negative for acute change except as noted in the HPI.     The history is provided by the patient.    Past Medical History  Diagnosis Date  . Anemia 2012  . Esophageal varices (HCC) 2015    s/p multiple EGDs and bandings dating to 2015.   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 GIB (gastrointestinal bleeding)   . Cirrhosis (HCC) 2003  . Irregular periods 08/04/2014  . Vaginal Pap smear, abnormal   . Hepatic encephalopathy (HCC)   . Coagulopathy (HCC)   . STD (female)     mutiple. risky sexual behavior.   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 Ascites 2003   Past Surgical History  Procedure Laterality Date  . Gastric banding port revision    . Tonsillectomy    . Esophagogastroduodenoscopy  07/22/2011    Procedure: ESOPHAGOGASTRODUODENOSCOPY (EGD);  Surgeon: Graylin Shiver, MD;  Location: [facility] ENDOSCOPY;  Service: Endoscopy;  Laterality: N/A;  . Esophagogastroduodenoscopy N/A 09/11/2014    Procedure: ESOPHAGOGASTRODUODENOSCOPY (EGD);  Surgeon: Charlott Rakes, MD;  Location: [facility] ENDOSCOPY;  Service: Endoscopy;  Laterality: N/A;   Family History  Problem Relation Age of Onset  . Adopted: Yes   Social History   Substance Use Topics  . Smoking status: Former Smoker -- 0.00 packs/day for 0 years  . Smokeless tobacco: Never Used  . Alcohol Use: No   OB History    Gravida Para Term Preterm AB TAB SAB Ectopic Multiple Living       Review of Systems    Allergies  Amoxicillin  Home Medications   Prior to Admission medications   Medication Sig Start Date End Date Taking? Authorizing Provider  azaTHIOprine (IMURAN) 50 MG tablet Take 25 mg by mouth daily.   Yes Historical Provider, MD  carvedilol (COREG) 3.125 MG tablet Take 6.25 mg by mouth daily.   Yes Historical Provider, MD  furosemide (LASIX) 20 MG tablet Take 2 tablets (40 mg total) by mouth daily. 10/12/14  Yes Shanker Levora Dredge, MD  lactulose (CHRONULAC) 10 GM/15ML solution Take 30 mLs (20 g total) by mouth 2 (two) times daily. Patient taking differently: Take 20 g by mouth 3 (three) times daily.  09/13/14  Yes Vilinda Blanks Minor, NP  magnesium oxide (MAG-OX) 400 MG tablet Take 400 mg by mouth daily.   Yes Historical Provider, MD  metoCLOPramide (REGLAN) 5 MG tablet Take 5 mg by mouth 3 (three) times daily before meals. 30 minutes before each meal   Yes Historical Provider, MD  omeprazole (PRILOSEC) 40 MG capsule Take 40 mg by mouth daily.   Yes Historical Provider, MD  ondansetron (  ZOFRAN ODT) 4 MG disintegrating tablet  ODT q4 hours prn nausea/vomit 11/17/14  Yes Loren Racer, MD  oxyCODONE (ROXICODONE) 5 MG immediate release tablet Take 1 tablet (5 mg total) by mouth every 4 (four) hours as needed for severe pain. 11/17/14  Yes Loren Racer, MD  predniSONE (DELTASONE) 5 MG tablet Take 5 mg by mouth daily with breakfast.   Yes Historical Provider, MD  rifaximin (XIFAXAN) 550 MG TABS tablet Take 1 tablet (550 mg total) by mouth 2 (two) times daily. 09/13/14  Yes Vilinda Blanks Minor, NP  spironolactone (ALDACTONE) 50 MG tablet Take 150 mg by mouth daily.   Yes Historical Provider, MD  sulfamethoxazole-trimethoprim  (BACTRIM DS,SEPTRA DS) 800-160 MG per tablet Take 1 tablet by mouth daily.   Yes Historical Provider, MD  ursodiol (ACTIGALL) 300 MG capsule Take 600 mg by mouth daily.   Yes Historical Provider, MD  valGANciclovir (VALCYTE) 450 MG tablet Take 2 tablets (900 mg total) by mouth 2 (two) times daily. 10/12/14  Yes Shanker Levora Dredge, MD  ondansetron (ZOFRAN) 4 MG tablet Take 1 tablet (4 mg total) by mouth every 8 (eight) hours as needed for nausea or vomiting. 10/12/14   Shanker Levora Dredge, MD   BP 100/61 mmHg  Pulse 103  Temp(Src) 98.6 F (37 C) (Oral)  Resp 18  Ht  (1.448 m)  Wt 107 lb 14.4 oz (48.943 kg)  BMI 23.34 kg/m2  SpO2 97% Physical Exam  Constitutional: She is oriented to person, place, and time. She appears well-developed and well-nourished.  HENT:  Head: Normocephalic and atraumatic.  Eyes: EOM are normal. Pupils are equal, round, and reactive to light.  Neck: Neck supple.  Cardiovascular: Regular rhythm and normal heart sounds.   Pulmonary/Chest: Effort normal. No respiratory distress.  Abdominal: Soft. She exhibits distension. There is tenderness. There is rebound and guarding.  Neurological: She is alert and oriented to person, place, and time.  Skin: Skin is warm and dry.  Nursing note and vitals reviewed.   ED Course  .Paracentesis Date/Time: 11/19/2014 11:30 PM Performed by: Derwood Kaplan Authorized by: Derwood Kaplan Consent: Verbal consent obtained. Risks and benefits: risks, benefits and alternatives were discussed Consent given by: patient Patient understanding: patient states understanding of the procedure being performed Site marked: the operative site was marked Imaging studies: imaging studies available Required items: required blood products, implants, devices, and special equipment available Patient identity confirmed: arm band Time out: Immediately prior to procedure a "time out" was called to verify the correct patient, procedure, equipment,  support staff and site/side marked as required. Initial or subsequent exam: initial Procedure purpose: diagnostic Indications: suspected peritonitis Anesthesia: local infiltration Local anesthetic: lidocaine 1% with epinephrine Anesthetic total: 5 ml Patient sedated: no Preparation: Patient was prepped and draped in the usual sterile fashion. Needle gauge: 22 Ultrasound guidance: yes Puncture site: right lower quadrant Fluid removed: 100(ml) Fluid appearance: clear Dressing: 4x4 sterile gauze Patient tolerance: Patient tolerated the procedure well with no immediate complications         (including critical care time) Labs Review Labs Reviewed  COMPREHENSIVE METABOLIC PANEL - Abnormal; Notable for the following:    Sodium 131 (*)    Potassium 3.3 (*)    Chloride 99 (*)    Glucose, Bld 104 (*)    Calcium 8.5 (*)    Total Protein 5.6 (*)    Albumin 2.6 (*)    AST 87 (*)    ALT 59 (*)    Alkaline Phosphatase  373 (*)    Total Bilirubin 6.3 (*)    All other components within normal limits  CBC - Abnormal; Notable for the following:    WBC 3.0 (*)    RBC 3.00 (*)    Hemoglobin 10.0 (*)    HCT 29.9 (*)    RDW 17.9 (*)    Platelets 58 (*)    All other components within normal limits  URINALYSIS, ROUTINE W REFLEX MICROSCOPIC (NOT AT [facility]) - Abnormal; Notable for the following:    APPearance CLOUDY (*)    Leukocytes, UA MODERATE (*)    All other components within normal limits  BODY FLUID CELL COUNT WITH DIFFERENTIAL - Abnormal; Notable for the following:    Color, Fluid STRAW (*)    Appearance, Fluid HAZY (*)    All other components within normal limits  PROTIME-INR - Abnormal; Notable for the following:    Prothrombin Time 15.5 (*)    All other components within normal limits  URINE MICROSCOPIC-ADD ON - Abnormal; Notable for the following:    Squamous Epithelial / LPF MANY (*)    Bacteria, UA MANY (*)    All other components within normal limits  BODY FLUID  CULTURE  CULTURE, BLOOD (ROUTINE X 2)  CULTURE, BLOOD (ROUTINE X 2)  URINE CULTURE  LIPASE, BLOOD  GLUCOSE, PERITONEAL FLUID  I-STAT BETA HCG BLOOD, ED ([facility], [facility], [facility] ONLY)    Imaging Review No results found. I have personally reviewed and evaluated these images and lab results as part of my medical decision-making.   EKG Interpretation None      @10 :30: s/p paracenthesis. Pain has resolved. Fluid analysis pending.  @11 :50: pain is returning, and it is severe. CT scan ordered. No SBP based on prelim results. Dr. Mora Bellmanni to f/u on CT. She is s/p ceftriaxone and flagyl.  MDM   Final diagnoses:  Epigastric pain  Primary biliary cholangitis (HCC)  Ascites    Pt comes in with severe abdominal pain, mostly epigastric and in the upper quadrants. She has guarding and rebound. Concerns for SBP. Diagnostic paracenthesis done.  If neg for SBP, will get CT scan.  Pt has had US and CT scan done in the last 2 months. US was normal, while the CT scan did show colitis and bowel edema. Pt is immunosuppressed and this is a return visit to the ER, so we will be more thorough.     Derwood KaplanAnkit Jerin Franzel, MD 11/19/14 2355

## 2014-11-19 NOTE — ED Notes (Signed)
MD at bedside. 

## 2014-11-20 ENCOUNTER — Observation Stay (HOSPITAL_COMMUNITY): Payer: BC Managed Care – PPO

## 2014-11-20 ENCOUNTER — Emergency Department (HOSPITAL_COMMUNITY): Payer: BC Managed Care – PPO

## 2014-11-20 DIAGNOSIS — K830 Cholangitis: Secondary | ICD-10-CM | POA: Diagnosis not present

## 2014-11-20 DIAGNOSIS — R100 Acute abdomen: Secondary | ICD-10-CM | POA: Diagnosis not present

## 2014-11-20 DIAGNOSIS — K7290 Hepatic failure, unspecified without coma: Secondary | ICD-10-CM

## 2014-11-20 DIAGNOSIS — D689 Coagulation defect, unspecified: Secondary | ICD-10-CM

## 2014-11-20 DIAGNOSIS — K745 Biliary cirrhosis, unspecified: Secondary | ICD-10-CM

## 2014-11-20 DIAGNOSIS — K754 Autoimmune hepatitis: Secondary | ICD-10-CM | POA: Diagnosis not present

## 2014-11-20 DIAGNOSIS — I9589 Other hypotension: Secondary | ICD-10-CM

## 2014-11-20 DIAGNOSIS — R161 Splenomegaly, not elsewhere classified: Secondary | ICD-10-CM

## 2014-11-20 DIAGNOSIS — R188 Other ascites: Secondary | ICD-10-CM

## 2014-11-20 DIAGNOSIS — D61818 Other pancytopenia: Secondary | ICD-10-CM

## 2014-11-20 MED ORDER — IOHEXOL 300 MG/ML  SOLN
80.0000 mL | Freq: Once | INTRAMUSCULAR | Status: AC | PRN
  Administered 2014-11-20: 100 mL via INTRAVENOUS

## 2014-11-20 MED ORDER — LACTULOSE 10 GM/15ML PO SOLN: 20.0000 g | Freq: Three times a day (TID) | ORAL | Status: DC

## 2014-11-20 MED ORDER — ALUM & MAG HYDROXIDE-SIMETH 200-200-20 MG/5ML PO SUSP: 30.0000 mL | Freq: Four times a day (QID) | ORAL | Status: DC | PRN

## 2014-11-20 MED ORDER — MORPHINE SULFATE (PF) 2 MG/ML IV SOLN: 2.0000 mg | INTRAVENOUS | Status: DC | PRN

## 2014-11-20 MED ORDER — OXYCODONE HCL 5 MG PO TABS: 5.0000 mg | ORAL_TABLET | ORAL | Status: DC | PRN

## 2014-11-20 MED ORDER — SULFAMETHOXAZOLE-TRIMETHOPRIM 800-160 MG PO TABS: 1.0000 | ORAL_TABLET | Freq: Every day | ORAL | Status: DC

## 2014-11-20 MED ORDER — SODIUM CHLORIDE 0.9 % IV SOLN: 250.0000 mL | INTRAVENOUS | Status: DC | PRN

## 2014-11-20 MED ORDER — ONDANSETRON HCL 4 MG PO TABS: 4.0000 mg | ORAL_TABLET | Freq: Four times a day (QID) | ORAL | Status: DC | PRN

## 2014-11-20 MED ORDER — ONDANSETRON HCL 4 MG/2ML IJ SOLN
4.0000 mg | Freq: Once | INTRAMUSCULAR | Status: AC
  Administered 2014-11-20: 4 mg via INTRAVENOUS
  Filled 2014-11-20: qty 2

## 2014-11-20 MED ORDER — RIFAXIMIN 550 MG PO TABS: 550.0000 mg | ORAL_TABLET | Freq: Two times a day (BID) | ORAL | Status: DC

## 2014-11-20 MED ORDER — PANTOPRAZOLE SODIUM 40 MG PO TBEC: 40.0000 mg | DELAYED_RELEASE_TABLET | Freq: Every day | ORAL | Status: DC

## 2014-11-20 MED ORDER — MAGNESIUM OXIDE 400 MG PO TABS: 400.0000 mg | ORAL_TABLET | Freq: Every day | ORAL | Status: DC

## 2014-11-20 MED ORDER — LIDOCAINE HCL (PF) 1 % IJ SOLN
INTRAMUSCULAR | Status: AC
  Filled 2014-11-20: qty 10

## 2014-11-20 MED ORDER — METOCLOPRAMIDE HCL 10 MG PO TABS: 5.0000 mg | ORAL_TABLET | Freq: Three times a day (TID) | ORAL | Status: DC

## 2014-11-20 MED ORDER — FENTANYL CITRATE (PF) 100 MCG/2ML IJ SOLN
25.0000 ug | Freq: Once | INTRAMUSCULAR | Status: AC
  Administered 2014-11-20: 25 ug via INTRAVENOUS
  Filled 2014-11-20: qty 2

## 2014-11-20 MED ORDER — AZATHIOPRINE 50 MG PO TABS: 25.0000 mg | ORAL_TABLET | Freq: Every day | ORAL | Status: DC

## 2014-11-20 MED ORDER — SODIUM CHLORIDE 0.9 % IJ SOLN: 3.0000 mL | INTRAMUSCULAR | Status: DC | PRN

## 2014-11-20 MED ORDER — ONDANSETRON HCL 4 MG/2ML IJ SOLN: 4.0000 mg | Freq: Four times a day (QID) | INTRAMUSCULAR | Status: DC | PRN

## 2014-11-20 MED ORDER — PREDNISONE 5 MG PO TABS: 5.0000 mg | ORAL_TABLET | Freq: Every day | ORAL | Status: DC

## 2014-11-20 MED ORDER — MORPHINE SULFATE (PF) 4 MG/ML IV SOLN
6.0000 mg | Freq: Once | INTRAVENOUS | Status: AC
  Administered 2014-11-20: 6 mg via INTRAVENOUS
  Filled 2014-11-20 (×2): qty 2

## 2014-11-20 MED ORDER — SODIUM CHLORIDE 0.9 % IJ SOLN: 3.0000 mL | Freq: Two times a day (BID) | INTRAMUSCULAR | Status: DC

## 2014-11-20 MED ORDER — ALBUMIN HUMAN 25 % IV SOLN: 25.0000 g | INTRAVENOUS | Status: DC

## 2014-11-20 MED ORDER — ONDANSETRON HCL 4 MG PO TABS: 4.0000 mg | ORAL_TABLET | Freq: Three times a day (TID) | ORAL | Status: DC | PRN

## 2014-11-20 MED ORDER — URSODIOL 300 MG PO CAPS: 600.0000 mg | ORAL_CAPSULE | Freq: Every day | ORAL | Status: DC

## 2014-11-20 MED ORDER — SODIUM CHLORIDE 0.9 % IV BOLUS (SEPSIS)
500.0000 mL | Freq: Once | INTRAVENOUS | Status: AC
  Administered 2014-11-20: 500 mL via INTRAVENOUS

## 2014-11-20 MED ORDER — VALGANCICLOVIR HCL 450 MG PO TABS: 900.0000 mg | ORAL_TABLET | Freq: Two times a day (BID) | ORAL | Status: DC

## 2014-11-20 NOTE — ED Notes (Signed)
Patient transported to CT 

## 2014-11-20 NOTE — ED Notes (Signed)
MD Riswan assessed pt, per MD pt to be discharged.

## 2014-11-20 NOTE — ED Notes (Signed)
Patient transported to Ultrasound 

## 2014-11-20 NOTE — ED Notes (Signed)
Patient given Bus pass.

## 2014-11-20 NOTE — ED Notes (Signed)
MD Rizwan aware Albumin not given prior to paracentesis d/t delay in order.  BP stable on transport to US-MD to reassess on return from procedure.

## 2014-11-20 NOTE — ED Notes (Signed)
Jared MD at bedside. 

## 2014-11-20 NOTE — ED Provider Notes (Signed)
  Physical Exam  BP 89/55 mmHg  Pulse 84  Temp(Src) 98.6 F (37 C) (Oral)  Resp 18  Ht 4\' 9"  (1.448 m)  Wt 107 lb 14.4 oz (48.943 kg)  BMI 23.34 kg/m2  SpO2 94%  Physical Exam  ED Course  Procedures  MDM Sign out assumed from Dr. Mora Bellmanni. Patient seen by Dr. Higinio PlanNanvanti earlier. There was concern for SBP so therapeutic paracentesis performed that showed no SBP. CT showed large volume ascites. Dr. Julian ReilGardner saw patient last night and recommend therapeutic paracentesis with IR. IR to perform paracentesis this morning. However, BP persistently low in the 80s. Also albumin low. Ordered another NS bolus and likely will need albumin. Will need admission and monitoring after paracentesis.   Richardean Canalavid H Jailyn Langhorst, MD 11/20/14 [phone]

## 2014-11-20 NOTE — Consult Note (Signed)
[facility] Consult Note  Melanie Conley XBJ:[id number] DOB: [birth year] DOA: 11/19/2014   PCP: Woodfin Ganja, MD    Chief Complaint: abdominal pain  HPI: Melanie Conley is a 24 y.o. female with primary sclerosing cholangitis and autoimmune hepatitis and who is on the transplant list. She has associated cirrhosis, ascites, splenomegaly, coagulopathy and esophageal varices status post banding. She is also on treatment for CMV viremia and antibiotic prophylaxis for SBP. She has had to have occassional paracentesis performed despite being on diuretics. Her last paracentesis was about a month ago when she was admitted to [facility] at the time. She presents today for abdominal distention and diffuse abdominal pain. No complaint of fever or chills. No nausea, vomiting, diarrhea or constipation. She's had a diagnostic paracentesis which is negative for SBP. As abdomen is still distended and she is still in pain, she needs a therapeutic thoracentesis. Blood pressure is quite low and therefore the ER doctor requested admission as he suspects she will need albumin and monitoring overnight to ensure she doesn't become increasingly hypotensive.   Review of Systems  Constitutional: Positive for weight loss. Negative for fever, chills, malaise/fatigue and diaphoresis.  HENT: Negative.   Eyes: Negative.   Respiratory: Negative.   Cardiovascular: Negative.   Gastrointestinal: Negative.   Genitourinary: Negative.   Skin: Negative.   Neurological: Positive for weakness.  Psychiatric/Behavioral: Negative.     Past Medical History  Diagnosis Date  . Anemia 2012  . Esophageal varices (HCC) 2015    s/p multiple EGDs and bandings dating to 2015.   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 GIB (gastrointestinal bleeding)   . Cirrhosis (HCC) 2003  . Irregular periods 08/04/2014  . Vaginal Pap smear, abnormal   . Hepatic encephalopathy (HCC)   .  Coagulopathy (HCC)   . STD (female)     mutiple. risky sexual behavior.   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 Ascites 2003    Past Surgical History  Procedure Laterality Date  . Gastric banding port revision    . Tonsillectomy    . Esophagogastroduodenoscopy  07/22/2011    Procedure: ESOPHAGOGASTRODUODENOSCOPY (EGD);  Surgeon: Graylin Shiver, MD;  Location: [facility] ENDOSCOPY;  Service: Endoscopy;  Laterality: N/A;  . Esophagogastroduodenoscopy N/A 09/11/2014    Procedure: ESOPHAGOGASTRODUODENOSCOPY (EGD);  Surgeon: Charlott Rakes, MD;  Location: [facility] ENDOSCOPY;  Service: Endoscopy;  Laterality: N/A;    Social History: does not smoke or drink alcohol. No illicit drug use Lives at home with boyfriend    Allergies  Allergen Reactions  . 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 (pt was in the hospital when this occurred - May 2016) Has patient had a PCN reaction causing immediate rash, facial/tongue/throat swelling, SOB or lightheadedness with hypotension: No Has patient had a PCN reaction causing severe rash involving mucus membranes or skin necrosis: No Has patient had a PCN reaction that required hospitalization Yes Has patient had a PCN reaction occurring within the last 10 years: Yes If all of the above answers are "NO", then moay proceed with Ceph    Family history:   Family History  Problem Relation Age of Onset  . Adopted: Yes      Prior to Admission medications   Medication Sig Start Date End Date Taking? Authorizing Provider  azaTHIOprine (IMURAN) 50 MG tablet Take 25 mg by mouth daily.   Yes Historical Provider, MD  carvedilol (COREG) 3.125 MG  tablet Take 6.25 mg by mouth daily.   Yes Historical Provider, MD  furosemide (LASIX) 20 MG tablet Take 2 tablets (40 mg total) by mouth daily. 10/12/14  Yes Shanker Levora Dredge, MD  lactulose (CHRONULAC) 10 GM/15ML solution Take 30 mLs (20 g total) by mouth 2 (two) times daily. Patient taking differently: Take 20  g by mouth 3 (three) times daily.  09/13/14  Yes Vilinda Blanks Minor, NP  magnesium oxide (MAG-OX) 400 MG tablet Take 400 mg by mouth daily.   Yes Historical Provider, MD  metoCLOPramide (REGLAN) 5 MG tablet Take 5 mg by mouth 3 (three) times daily before meals. 30 minutes before each meal   Yes Historical Provider, MD  omeprazole (PRILOSEC) 40 MG capsule Take 40 mg by mouth daily.   Yes Historical Provider, MD  ondansetron (ZOFRAN ODT) 4 MG disintegrating tablet 4mg  ODT q4 hours prn nausea/vomit 11/17/14  Yes Loren Racer, MD  oxyCODONE (ROXICODONE) 5 MG immediate release tablet Take 1 tablet (5 mg total) by mouth every 4 (four) hours as needed for severe pain. 11/17/14  Yes Loren Racer, MD  predniSONE (DELTASONE) 5 MG tablet Take 5 mg by mouth daily with breakfast.   Yes Historical Provider, MD  rifaximin (XIFAXAN) 550 MG TABS tablet Take 1 tablet (550 mg total) by mouth 2 (two) times daily. 09/13/14  Yes Vilinda Blanks Minor, NP  spironolactone (ALDACTONE) 50 MG tablet Take 150 mg by mouth daily.   Yes Historical Provider, MD  sulfamethoxazole-trimethoprim (BACTRIM DS,SEPTRA DS) 800-160 MG per tablet Take 1 tablet by mouth daily.   Yes Historical Provider, MD  ursodiol (ACTIGALL) 300 MG capsule Take 600 mg by mouth daily.   Yes Historical Provider, MD  valGANciclovir (VALCYTE) 450 MG tablet Take 2 tablets (900 mg total) by mouth 2 (two) times daily. 10/12/14  Yes Shanker Levora Dredge, MD  ondansetron (ZOFRAN) 4 MG tablet Take 1 tablet (4 mg total) by mouth every 8 (eight) hours as needed for nausea or vomiting. 10/12/14   Shanker Levora Dredge, MD     Physical Exam: Filed Vitals:   11/20/14 0630 11/20/14 0707 11/20/14 0730 11/20/14 0808  BP: 86/58 93/59 87/53  101/53  Pulse: 80 91 88 92  Temp:      TempSrc:      Resp:  16    Height:      Weight:      SpO2: 97% 98% 94% 98%     General: AAO x 3, no acute distress HEENT: Normocephalic and Atraumatic, Mucous membranes pink                PERRLA; EOM  intact; + scleral icterus,                 Nares: Patent, Oropharynx: Clear, Fair Dentition                 Neck: FROM, no cervical lymphadenopathy, thyromegaly, carotid bruit or JVD;  Breasts: deferred CHEST WALL: No tenderness  CHEST: Normal respiration, clear to auscultation bilaterally  HEART: Regular rate and rhythm; no murmurs rubs or gallops  BACK: No kyphosis or scoliosis; no CVA tenderness  GI: Positive Bowel Sounds, soft, mild diffuse tenderness; + distension with dullness to percussion  Rectal Exam: deferred MSK: No cyanosis, clubbing, or edema Genitalia: not examined  SKIN:  no rash or ulceration  CNS: Alert and Oriented x 4, Nonfocal exam, CN 2-12 intact  Labs on Admission:  Basic Metabolic Panel:  Recent Labs Lab 11/17/14 0238 11/19/14  1812  NA 131* 131*  K 4.6 3.3*  CL 103 99*  CO2 21* 24  GLUCOSE 114* 104*  BUN 12 9  CREATININE 0.98 0.72  CALCIUM 8.4* 8.5*   Liver Function Tests:  Recent Labs Lab 11/17/14 0238 11/19/14 1812  AST 48* 87*  ALT 42 59*  ALKPHOS 284* 373*  BILITOT 7.7* 6.3*  PROT 5.8* 5.6*  ALBUMIN 2.7* 2.6*    Recent Labs Lab 11/17/14 0238 11/19/14 1812  LIPASE 39 45    Recent Labs Lab 11/17/14 0313  AMMONIA 96*   CBC:  Recent Labs Lab 11/17/14 0238 11/19/14 1812  WBC 3.9* 3.0*  HGB 10.0* 10.0*  HCT 30.0* 29.9*  MCV 99.7 99.7  PLT 77* 58*   Cardiac Enzymes: No results for input(s): CKTOTAL, CKMB, CKMBINDEX, TROPONINI in the last 168 hours.  BNP (last 3 results) No results for input(s): BNP in the last 8760 hours.  ProBNP (last 3 results) No results for input(s): PROBNP in the last 8760 hours.  CBG: No results for input(s): GLUCAP in the last 168 hours.  Radiological Exams on Admission: Ct Abdomen Pelvis W Contrast  11/20/2014  CLINICAL DATA:  Epigastric abdominal pain. History of SBP. Known cirrhosis. EXAM: CT ABDOMEN AND PELVIS WITH CONTRAST TECHNIQUE: Multidetector CT imaging of the abdomen and pelvis  was performed using the standard protocol following bolus administration of intravenous contrast. CONTRAST:  OMNIPAQUE IOHEXOL 300 MG/ML  SOLN COMPARISON:  Most recent CT 09/05/2014 FINDINGS: Lower chest: The included lung bases are clear. No pleural effusion. Liver: Nodular hepatic contours consistent with known cirrhosis. Punctate hepatic granuloma anteriorly. No focal lesion is seen. Hepatobiliary: Gallbladder physiologically distended, no calcified stone. Pancreas:  Normal. Spleen: Splenomegaly, greatest dimension of 16.5 cm with splenic volume of 1028 mL. Multiple peripheral splenic infarcts are unchanged from prior. Adrenal glands: No nodule, the left adrenal gland is partially obscured. Kidneys: Symmetric renal enhancement.  No hydronephrosis. Stomach/Bowel: Stomach physiologically distended. There is submucosal edema involving the right and transverse colon. Diffuse enhancement of small bowel. Small volume of colonic stool. No bowel dilatation. Vascular/Lymphatic: Porta systemic varicosities noted. Multiple perigastric and paraesophageal varices. Splenic varices with splenorenal shunting. No retroperitoneal adenopathy. Abdominal aorta is normal in caliber. Circum aortic left renal vein incidentally noted. Reproductive: Uterus is normal in size. The ovaries are symmetric in size. Bladder: Physiologically distended. Calcification at the dome is unchanged, the previous year apical nodule was less well-defined but grossly unchanged in size. Other: Large volume of intra-abdominal ascites, simple fluid density. The degree of ascites is increased from prior. There is ascites within an umbilical hernia. Musculoskeletal: There are no acute or suspicious osseous abnormalities. IMPRESSION: 1. Diffuse mucosal enhancement involving the small bowel. Suspect secondary to hypoproteinemia, and progressed from prior exam. 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 2. Large  volume intra-abdominal ascites, increased. 3. Cirrhosis with portosystemic shunting, multiple varices, and splenomegaly with multiple infarcts, again seen. Electronically Signed   By: Rubye Oaks M.D.   On: 11/20/2014 00:47    EKG: Independently reviewed. Sinus tachycardia at 110 bpm  Assessment/Plan Principal Problem:   Abdominal pain, acute -Secondary to abdominal distention with ascites -Ascitic fluid negative for SBP -I initially admitted the patient and ordered Albumin to go along with her paracentesis. Her RN rechecked her BP with a cuff that was more appropriate for her size and her BP was found to be in the low 100's systolic.  -She underwent a paracentetics without albumin.  BP remains in the low 100s at this time.  - Orthostatic vitals are negative. Her abdominal pain has resolved. She is stable to be discharged from the ER and she agrees with this plan  - she can continue all outpt meds as ordered   Active Problems:    Autoimmune hepatitis, primary sclerosing cholangitis-  Cirrhosis/splenomegaly -Continue Imura, prednisone  and ursodiol -Continue Bactrim for SBP prophylaxis - she [facility] me [facility]  CMV viremia -Continue valganciclovir   Hepatic encephalopathy -Continue Xifaxan and lactulose    Coagulopathy  -PT only mildly elevated at 15.5 today  Pancytopenia -Hemoglobin stable at 10 -Platelets stable-range between 50-80 at baseline -White blood cell count ranges between 2 and 4 and is within range today  History of GI bleed  -Status post banding of varices  History of portal vein thrombosis -Was determined not to be a candidate for anticoagulation    Time spent: 65 minutes  Cassanda Walmer, MD [facility]

## 2014-11-20 NOTE — ED Notes (Signed)
Admitting at bedside. MD aware of low BP

## 2014-11-20 NOTE — Procedures (Signed)
Successful US guided paracentesis from LLQ.  Yielded 5.3L of clear yellow fluid.  No immediate complications.  Pt tolerated well.   Specimen was not sent for labs.  Brayton ElBRUNING, Yukari Flax PA-C 11/20/2014 10:10 AM

## 2014-11-20 NOTE — ED Notes (Signed)
Patient eating breakfast. °

## 2014-11-20 NOTE — ED Provider Notes (Signed)
CT scan reveals worsening bowel edema. This is likely due to ascites. I discussed the patient with Dr. Lanae BoastGarner who recommends to keep the patient for paracentesis by interventional radiology in the morning. I will place the consult IR.  Tomasita CrumbleAdeleke Devynn Scheff, MD 11/20/14 [phone]

## 2014-11-20 NOTE — H&P (Deleted)
[facility] History and Physical  Melanie Conley MVH:[id number] DOB: 08-08-1990 DOA: 11/19/2014   PCP: Woodfin Ganja, MD    Chief Complaint: abdominal pain  HPI: Melanie Conley is a 24 y.o. female with primary sclerosing cholangitis and autoimmune hepatitis and who is on the transplant list. She has associated cirrhosis, ascites, splenomegaly, coagulopathy and esophageal varices status post banding. She is also on treatment for CMV viremia and antibiotic prophylaxis for SBP. She has had to have occassional paracentesis performed despite being on diuretics. Her last paracentesis was about a month ago when she was admitted to [facility] at the time. She presents today for abdominal distention and diffuse abdominal pain. No complaint of fever or chills. No nausea, vomiting, diarrhea or constipation. She's had a diagnostic paracentesis which is negative for SBP. As abdomen is still distended and she is still in pain, she needs a therapeutic thoracentesis. Blood pressure is quite low and therefore she will need albumin and monitoring overnight to ensure she doesn't become increasingly hypotensive.   Review of Systems  Constitutional: Positive for weight loss. Negative for fever, chills, malaise/fatigue and diaphoresis.  HENT: Negative.   Eyes: Negative.   Respiratory: Negative.   Cardiovascular: Negative.   Gastrointestinal: Negative.   Genitourinary: Negative.   Skin: Negative.   Neurological: Positive for weakness.  Psychiatric/Behavioral: Negative.     Past Medical History  Diagnosis Date  . Anemia 2012  . Esophageal varices (HCC) 2015    s/p multiple EGDs and bandings dating to 2015.   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 GIB (gastrointestinal bleeding)   . Cirrhosis (HCC) 2003  . Irregular periods 08/04/2014  . Vaginal Pap smear, abnormal   . Hepatic encephalopathy (HCC)   . Coagulopathy (HCC)   . STD (female)    mutiple. risky sexual behavior.   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 Ascites 2003    Past Surgical History  Procedure Laterality Date  . Gastric banding port revision    . Tonsillectomy    . Esophagogastroduodenoscopy  07/22/2011    Procedure: ESOPHAGOGASTRODUODENOSCOPY (EGD);  Surgeon: Graylin Shiver, MD;  Location: [facility] ENDOSCOPY;  Service: Endoscopy;  Laterality: N/A;  . Esophagogastroduodenoscopy N/A 09/11/2014    Procedure: ESOPHAGOGASTRODUODENOSCOPY (EGD);  Surgeon: Charlott Rakes, MD;  Location: [facility] ENDOSCOPY;  Service: Endoscopy;  Laterality: N/A;    Social History: does not smoke or drink alcohol. No illicit drug use Lives at home with boyfriend    Allergies  Allergen Reactions  . 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 (pt was in the hospital when this occurred - May 2016) Has patient had a PCN reaction causing immediate rash, facial/tongue/throat swelling, SOB or lightheadedness with hypotension: No Has patient had a PCN reaction causing severe rash involving mucus membranes or skin necrosis: No Has patient had a PCN reaction that required hospitalization Yes Has patient had a PCN reaction occurring within the last 10 years: Yes If all of the above answers are "NO", then moay proceed with Ceph    Family history:   Family History  Problem Relation Age of Onset  . Adopted: Yes      Prior to Admission medications   Medication Sig Start Date End Date Taking? Authorizing Provider  azaTHIOprine (IMURAN) 50 MG tablet Take 25 mg by mouth daily.   Yes Historical Provider, MD  carvedilol (COREG) 3.125 MG tablet Take 6.25 mg by mouth daily.  Yes Historical Provider, MD  furosemide (LASIX) 20 MG tablet Take 2 tablets (40 mg total) by mouth daily. 10/12/14  Yes Shanker Levora DredgeM Ghimire, MD  lactulose (CHRONULAC) 10 GM/15ML solution Take 30 mLs (20 g total) by mouth 2 (two) times daily. Patient taking differently: Take 20 g by mouth 3 (three) times daily.  09/13/14   Yes Vilinda BlanksWilliam S Minor, NP  magnesium oxide (MAG-OX) 400 MG tablet Take 400 mg by mouth daily.   Yes Historical Provider, MD  metoCLOPramide (REGLAN) 5 MG tablet Take 5 mg by mouth 3 (three) times daily before meals. 30 minutes before each meal   Yes Historical Provider, MD  omeprazole (PRILOSEC) 40 MG capsule Take 40 mg by mouth daily.   Yes Historical Provider, MD  ondansetron (ZOFRAN ODT) 4 MG disintegrating tablet 4mg  ODT q4 hours prn nausea/vomit 11/17/14  Yes Loren Raceravid Yelverton, MD  oxyCODONE (ROXICODONE) 5 MG immediate release tablet Take 1 tablet (5 mg total) by mouth every 4 (four) hours as needed for severe pain. 11/17/14  Yes Loren Raceravid Yelverton, MD  predniSONE (DELTASONE) 5 MG tablet Take 5 mg by mouth daily with breakfast.   Yes Historical Provider, MD  rifaximin (XIFAXAN) 550 MG TABS tablet Take 1 tablet (550 mg total) by mouth 2 (two) times daily. 09/13/14  Yes Vilinda BlanksWilliam S Minor, NP  spironolactone (ALDACTONE) 50 MG tablet Take 150 mg by mouth daily.   Yes Historical Provider, MD  sulfamethoxazole-trimethoprim (BACTRIM DS,SEPTRA DS) 800-160 MG per tablet Take 1 tablet by mouth daily.   Yes Historical Provider, MD  ursodiol (ACTIGALL) 300 MG capsule Take 600 mg by mouth daily.   Yes Historical Provider, MD  valGANciclovir (VALCYTE) 450 MG tablet Take 2 tablets (900 mg total) by mouth 2 (two) times daily. 10/12/14  Yes Shanker Levora DredgeM Ghimire, MD  ondansetron (ZOFRAN) 4 MG tablet Take 1 tablet (4 mg total) by mouth every 8 (eight) hours as needed for nausea or vomiting. 10/12/14   Shanker Levora DredgeM Ghimire, MD     Physical Exam: Filed Vitals:   11/20/14 0630 11/20/14 0707 11/20/14 0730 11/20/14 0808  BP: 86/58 93/59 87/53  101/53  Pulse: 80 91 88 92  Temp:      TempSrc:      Resp:  16    Height:      Weight:      SpO2: 97% 98% 94% 98%     General: AAO x 3, no acute distress HEENT: Normocephalic and Atraumatic, Mucous membranes pink                PERRLA; EOM intact; + scleral icterus,                  Nares: Patent, Oropharynx: Clear, Fair Dentition                 Neck: FROM, no cervical lymphadenopathy, thyromegaly, carotid bruit or JVD;  Breasts: deferred CHEST WALL: No tenderness  CHEST: Normal respiration, clear to auscultation bilaterally  HEART: Regular rate and rhythm; no murmurs rubs or gallops  BACK: No kyphosis or scoliosis; no CVA tenderness  GI: Positive Bowel Sounds, soft, mild diffuse tenderness; + distension with dullness to percussion  Rectal Exam: deferred MSK: No cyanosis, clubbing, or edema Genitalia: not examined  SKIN:  no rash or ulceration  CNS: Alert and Oriented x 4, Nonfocal exam, CN 2-12 intact  Labs on Admission:  Basic Metabolic Panel:  Recent Labs Lab 11/17/14 0238 11/19/14 1812  NA 131* 131*  K 4.6 3.3*  CL 103 99*  CO2 21* 24  GLUCOSE 114* 104*  BUN 12 9  CREATININE 0.98 0.72  CALCIUM 8.4* 8.5*   Liver Function Tests:  Recent Labs Lab 11/17/14 0238 11/19/14 1812  AST 48* 87*  ALT 42 59*  ALKPHOS 284* 373*  BILITOT 7.7* 6.3*  PROT 5.8* 5.6*  ALBUMIN 2.7* 2.6*    Recent Labs Lab 11/17/14 0238 11/19/14 1812  LIPASE 39 45    Recent Labs Lab 11/17/14 0313  AMMONIA 96*   CBC:  Recent Labs Lab 11/17/14 0238 11/19/14 1812  WBC 3.9* 3.0*  HGB 10.0* 10.0*  HCT 30.0* 29.9*  MCV 99.7 99.7  PLT 77* 58*   Cardiac Enzymes: No results for input(s): CKTOTAL, CKMB, CKMBINDEX, TROPONINI in the last 168 hours.  BNP (last 3 results) No results for input(s): BNP in the last 8760 hours.  ProBNP (last 3 results) No results for input(s): PROBNP in the last 8760 hours.  CBG: No results for input(s): GLUCAP in the last 168 hours.  Radiological Exams on Admission: Ct Abdomen Pelvis W Contrast  11/20/2014  CLINICAL DATA:  Epigastric abdominal pain. History of SBP. Known cirrhosis. EXAM: CT ABDOMEN AND PELVIS WITH CONTRAST TECHNIQUE: Multidetector CT imaging of the abdomen and pelvis was performed using the standard protocol  following bolus administration of intravenous contrast. CONTRAST:  OMNIPAQUE IOHEXOL 300 MG/ML  SOLN COMPARISON:  Most recent CT 09/05/2014 FINDINGS: Lower chest: The included lung bases are clear. No pleural effusion. Liver: Nodular hepatic contours consistent with known cirrhosis. Punctate hepatic granuloma anteriorly. No focal lesion is seen. Hepatobiliary: Gallbladder physiologically distended, no calcified stone. Pancreas:  Normal. Spleen: Splenomegaly, greatest dimension of 16.5 cm with splenic volume of 1028 mL. Multiple peripheral splenic infarcts are unchanged from prior. Adrenal glands: No nodule, the left adrenal gland is partially obscured. Kidneys: Symmetric renal enhancement.  No hydronephrosis. Stomach/Bowel: Stomach physiologically distended. There is submucosal edema involving the right and transverse colon. Diffuse enhancement of small bowel. Small volume of colonic stool. No bowel dilatation. Vascular/Lymphatic: Porta systemic varicosities noted. Multiple perigastric and paraesophageal varices. Splenic varices with splenorenal shunting. No retroperitoneal adenopathy. Abdominal aorta is normal in caliber. Circum aortic left renal vein incidentally noted. Reproductive: Uterus is normal in size. The ovaries are symmetric in size. Bladder: Physiologically distended. Calcification at the dome is unchanged, the previous year apical nodule was less well-defined but grossly unchanged in size. Other: Large volume of intra-abdominal ascites, simple fluid density. The degree of ascites is increased from prior. There is ascites within an umbilical hernia. Musculoskeletal: There are no acute or suspicious osseous abnormalities. IMPRESSION: 1. Diffuse mucosal enhancement involving the small bowel. Suspect secondary to hypoproteinemia, and progressed from prior exam. 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 2. Large volume intra-abdominal ascites, increased.  3. Cirrhosis with portosystemic shunting, multiple varices, and splenomegaly with multiple infarcts, again seen. Electronically Signed   By: Rubye Oaks M.D.   On: 11/20/2014 00:47    EKG: Independently reviewed. Sinus tachycardia at 110 bpm  Assessment/Plan Principal Problem:   Abdominal pain, acute -Secondary to abdominal distention with ascites -Ascitic fluid negative for SBP -We will admit- paracentesis ordered, will order albumin along with it -Monitor blood pressure overnight -Hold off on diuretics today and resume tomorrow if BP stable  Active Problems: Hypotension - Hold carvedilol, furosemide, spironolactone for today    Autoimmune hepatitis, primary sclerosing cholangitis-  Cirrhosis/splenomegaly -Continue Imura, prednisone  and ursodiol -Continue Bactrim for SBP prophylaxis  CMV viremia -Continue valganciclovir   Hepatic encephalopathy -Continue Xifaxan and lactulose    Coagulopathy  -PT only mildly elevated at 15.5 today  Pancytopenia -Hemoglobin stable at 10 -Platelets stable-range between 50-80 at baseline -White blood cell count ranges between 2 and 4 and is within range today  History of GI bleed  -Status post banding of varices  History of portal vein thrombosis -Was determined not to be a candidate for anticoagulation    Consulted:   Code Status: Full code  Family Communication: DVT Prophylaxis:SCDs  Time spent: 55 minutes  Aleira Deiter, MD [facility]  If 7PM-7AM, please contact night-coverage [email or web address] 11/20/2014, 8:18 AM

## 2014-11-21 LAB — URINE CULTURE

## 2014-11-21 LAB — PATHOLOGIST SMEAR REVIEW

## 2014-11-22 ENCOUNTER — Emergency Department (HOSPITAL_COMMUNITY)
Admission: EM | Admit: 2014-11-22 | Discharge: 2014-11-25 | Disposition: A | Discharge status: Home / Self Care | Payer: BC Managed Care – PPO | Attending: Emergency Medicine | Admitting: Emergency Medicine

## 2014-11-22 DIAGNOSIS — Z7952 Long term (current) use of systemic steroids: Secondary | ICD-10-CM | POA: Insufficient documentation

## 2014-11-22 DIAGNOSIS — Z880 Allergy status to penicillin: Secondary | ICD-10-CM | POA: Diagnosis not present

## 2014-11-22 DIAGNOSIS — Z8679 Personal history of other diseases of the circulatory system: Secondary | ICD-10-CM | POA: Diagnosis not present

## 2014-11-22 DIAGNOSIS — Z862 Personal history of diseases of the blood and blood-forming organs and certain disorders involving the immune mechanism: Secondary | ICD-10-CM | POA: Insufficient documentation

## 2014-11-22 DIAGNOSIS — Z87891 Personal history of nicotine dependence: Secondary | ICD-10-CM | POA: Diagnosis not present

## 2014-11-22 DIAGNOSIS — Z8719 Personal history of other diseases of the digestive system: Secondary | ICD-10-CM | POA: Diagnosis not present

## 2014-11-22 DIAGNOSIS — Z8619 Personal history of other infectious and parasitic diseases: Secondary | ICD-10-CM | POA: Diagnosis not present

## 2014-11-22 DIAGNOSIS — Z792 Long term (current) use of antibiotics: Secondary | ICD-10-CM | POA: Insufficient documentation

## 2014-11-22 DIAGNOSIS — Z79899 Other long term (current) drug therapy: Secondary | ICD-10-CM | POA: Diagnosis not present

## 2014-11-22 DIAGNOSIS — R17 Unspecified jaundice: Secondary | ICD-10-CM | POA: Diagnosis not present

## 2014-11-22 DIAGNOSIS — F29 Unspecified psychosis not due to a substance or known physiological condition: Secondary | ICD-10-CM | POA: Insufficient documentation

## 2014-11-22 DIAGNOSIS — Z8742 Personal history of other diseases of the female genital tract: Secondary | ICD-10-CM | POA: Diagnosis not present

## 2014-11-22 DIAGNOSIS — F1210 Cannabis abuse, uncomplicated: Secondary | ICD-10-CM | POA: Diagnosis not present

## 2014-11-22 DIAGNOSIS — Z3202 Encounter for pregnancy test, result negative: Secondary | ICD-10-CM | POA: Insufficient documentation

## 2014-11-22 DIAGNOSIS — F309 Manic episode, unspecified: Secondary | ICD-10-CM | POA: Diagnosis present

## 2014-11-22 NOTE — ED Notes (Signed)
Bed: WA09 Expected date:  Expected time:  Means of arrival:  Comments: EMS 14F AMS

## 2014-11-22 NOTE — ED Provider Notes (Addendum)
CSN: [id number]   Arrival date & time 11/22/14 2312  History  By signing my name below, I, Melanie Conley, attest that this documentation has been prepared under the direction and in the presence of Melanie Bushong, MD. Electronically Signed: Bethel Conley, ED Scribe. 11/23/2014. 12:31 AM.  Chief Complaint  Patient presents with  . Manic Behavior   Level V caveat secondary to psychosis HPI Patient is a 24 y.o. female presenting with mental health disorder. The history is provided by the EMS personnel. No language interpreter was used.  Mental Health Problem Presenting symptoms: bizarre behavior   Degree of incapacity (severity):  Severe Onset quality:  Sudden Timing:  Constant Progression:  Unchanged Chronicity:  Recurrent  Melanie Conley is a 24 y.o. female with PMHx of cirrhosis, autoimmune hepatitis, esophageal varices, ascites, hepatic encephalopathy who presents to the Emergency Department complaining of sudden onset bizarre behavior tonight. Per EMS report, boyfriend stated that the pt was chanting and taking off her clothes, dancing, touching herself, and continuously moving. He told EMS that she has had a similar episode in the past.   Past Medical History  Diagnosis Date  . Anemia 2012  . Esophageal varices (HCC) 2015    s/p multiple EGDs and bandings dating to 2015.   Melanie Conley Autoimmune hepatitis (HCC) 2003    with overlapping primary sclerosing cholangitis  . GIB (gastrointestinal bleeding)   . Cirrhosis (HCC) 2003  . Irregular periods 08/04/2014  . Vaginal Pap smear, abnormal   . Hepatic encephalopathy (HCC)   . Coagulopathy (HCC)   . STD (female)     mutiple. risky sexual behavior.   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 Ascites 2003    Past Surgical History  Procedure Laterality Date  . Gastric banding port revision    . Tonsillectomy    . Esophagogastroduodenoscopy  07/22/2011    Procedure: ESOPHAGOGASTRODUODENOSCOPY (EGD);  Surgeon: Graylin Shiver, MD;   Location: [facility] ENDOSCOPY;  Service: Endoscopy;  Laterality: N/A;  . Esophagogastroduodenoscopy N/A 09/11/2014    Procedure: ESOPHAGOGASTRODUODENOSCOPY (EGD);  Surgeon: Charlott Rakes, MD;  Location: [facility] ENDOSCOPY;  Service: Endoscopy;  Laterality: N/A;    Family History  Problem Relation Age of Onset  . Adopted: Yes    Social History  Substance Use Topics  . Smoking status: Former Smoker -- 0.00 packs/day for 0 years  . Smokeless tobacco: Never Used  . Alcohol Use: No     Review of Systems  Unable to perform ROS: Psychiatric disorder   Home Medications   Prior to Admission medications   Medication Sig Start Date End Date Taking? Authorizing Provider  azaTHIOprine (IMURAN) 50 MG tablet Take 25 mg by mouth daily.    Historical Provider, MD  carvedilol (COREG) 3.125 MG tablet Take 6.25 mg by mouth daily.    Historical Provider, MD  furosemide (LASIX) 20 MG tablet Take 2 tablets (40 mg total) by mouth daily. 10/12/14   Shanker Levora Dredge, MD  lactulose (CHRONULAC) 10 GM/15ML solution Take 30 mLs (20 g total) by mouth 2 (two) times daily. Patient taking differently: Take 20 g by mouth 3 (three) times daily.  09/13/14   Vilinda Blanks Minor, NP  magnesium oxide (MAG-OX) 400 MG tablet Take 400 mg by mouth daily.    Historical Provider, MD  metoCLOPramide (REGLAN) 5 MG tablet Take 5 mg by mouth 3 (three) times daily before meals. 30 minutes before each meal    Historical Provider, MD  omeprazole (PRILOSEC) 40 MG capsule  Take 40 mg by mouth daily.    Historical Provider, MD  ondansetron (ZOFRAN ODT) 4 MG disintegrating tablet  ODT q4 hours prn nausea/vomit 11/17/14   Loren Racer, MD  ondansetron (ZOFRAN) 4 MG tablet Take 1 tablet (4 mg total) by mouth every 8 (eight) hours as needed for nausea or vomiting. 10/12/14   Shanker Levora Dredge, MD  oxyCODONE (ROXICODONE) 5 MG immediate release tablet Take 1 tablet (5 mg total) by mouth every 4 (four) hours as needed for severe pain. 11/17/14   Loren Racer, MD  predniSONE (DELTASONE) 5 MG tablet Take 5 mg by mouth daily with breakfast.    Historical Provider, MD  rifaximin (XIFAXAN) 550 MG TABS tablet Take 1 tablet (550 mg total) by mouth 2 (two) times daily. 09/13/14   Vilinda Blanks Minor, NP  spironolactone (ALDACTONE) 50 MG tablet Take 150 mg by mouth daily.    Historical Provider, MD  sulfamethoxazole-trimethoprim (BACTRIM DS,SEPTRA DS) 800-160 MG per tablet Take 1 tablet by mouth daily.    Historical Provider, MD  ursodiol (ACTIGALL) 300 MG capsule Take 600 mg by mouth daily.    Historical Provider, MD  valGANciclovir (VALCYTE) 450 MG tablet Take 2 tablets (900 mg total) by mouth 2 (two) times daily. 10/12/14   Shanker Levora Dredge, MD    Allergies  Amoxicillin  Triage Vitals: BP 132/84 mmHg  Pulse 97  Temp(Src) 98.5 F (36.9 C) (Oral)  Resp 18  SpO2 100%  Physical Exam  Constitutional: She appears well-developed and well-nourished. No distress.  Responding to internal stimuli Repetitive sounds and motions   HENT:  Head: Normocephalic.  Mouth/Throat: Oropharynx is clear and moist. No oropharyngeal exudate.  Moist mucous membranes No exudate  Eyes: EOM are normal. Pupils are equal, round, and reactive to light. Scleral icterus is present.  Neck: Normal range of motion. Neck supple.  Trachea midline  Cardiovascular: Normal rate and regular rhythm.   Pulmonary/Chest: Effort normal and breath sounds normal. No stridor. No respiratory distress. She has no wheezes. She has no rales.  CTAB  Abdominal: Soft. Bowel sounds are normal. She exhibits no distension and no mass. There is no tenderness. There is no rebound and no guarding.  Musculoskeletal: Normal range of motion. She exhibits no edema or tenderness.  Neurological: She is alert. She has normal reflexes.  Skin: Skin is warm and dry.  Psychiatric:  Humming and touching face and stomach repetitively   Nursing note and vitals reviewed.   ED Course   Procedures   DIAGNOSTIC STUDIES: Oxygen Saturation is 100% on RA, normal by my interpretation.    COORDINATION OF CARE: 11:53 PM Treatment plan includes lab work.    Labs Reviewed  CBC WITH DIFFERENTIAL/PLATELET  HEPATIC FUNCTION PANEL  PROTIME-INR  URINALYSIS, ROUTINE W REFLEX MICROSCOPIC (NOT AT [facility])  AMMONIA  ACETAMINOPHEN LEVEL  SALICYLATE LEVEL  URINE RAPID DRUG SCREEN, HOSP PERFORMED  ETHANOL  I-STAT CHEM 8, ED    Imaging Review No results found.  I personally reviewed and evaluated these images and lab results as a part of my medical decision-making.       MDM   Final diagnoses:  None     Date: 11/23/2014  Rate: 81  Rhythm: normal sinus rhythm  QRS Axis: normal  Intervals: normal  ST/T Wave abnormalities: normal  Conduction Disutrbances: none  Narrative Interpretation: unremarkable Results for orders placed or performed during the hospital encounter of 11/22/14  CBC with Differential/Platelet  Result Value Ref Range   WBC  4.7 4.0 - 10.5 K/uL   RBC 3.15 (L) 3.87 - 5.11 MIL/uL   Hemoglobin 10.8 (L) 12.0 - 15.0 g/dL   HCT 16.1 (L) 09.6 - 04.5 %   MCV 101.6 (H) 78.0 - 100.0 fL   MCH 34.3 (H) 26.0 - 34.0 pg   MCHC 33.8 30.0 - 36.0 g/dL   RDW 40.9 (H) 81.1 - 91.4 %   Platelets 74 (L) 150 - 400 K/uL   Neutrophils Relative % 76 %   Lymphocytes Relative 12 %   Monocytes Relative 11 %   Eosinophils Relative 1 %   Basophils Relative 0 %   Neutro Abs 3.6 1.7 - 7.7 K/uL   Lymphs Abs 0.6 (L) 0.7 - 4.0 K/uL   Monocytes Absolute 0.5 0.1 - 1.0 K/uL   Eosinophils Absolute 0.0 0.0 - 0.7 K/uL   Basophils Absolute 0.0 0.0 - 0.1 K/uL  Hepatic function panel  Result Value Ref Range   Total Protein 6.3 (L) 6.5 - 8.1 g/dL   Albumin 3.2 (L) 3.5 - 5.0 g/dL   AST 782 (H) 15 - 41 U/L   ALT 70 (H) 14 - 54 U/L   Alkaline Phosphatase 411 (H) 38 - 126 U/L   Total Bilirubin 9.4 (H) 0.3 - 1.2 mg/dL   Bilirubin, Direct 4.6 (H) 0.1 - 0.5 mg/dL   Indirect Bilirubin 4.8  (H) 0.3 - 0.9 mg/dL  Protime-INR  Result Value Ref Range   Prothrombin Time 16.3 (H) 11.6 - 15.2 seconds   INR 1.29 0.00 - 1.49  Urinalysis, Routine w reflex microscopic (not at [facility])  Result Value Ref Range   Color, Urine ORANGE (A) YELLOW   APPearance CLOUDY (A) CLEAR   Specific Gravity, Urine 1.026 1.005 - 1.030   pH 6.0 5.0 - 8.0   Glucose, UA NEGATIVE NEGATIVE mg/dL   Hgb urine dipstick MODERATE (A) NEGATIVE   Bilirubin Urine MODERATE (A) NEGATIVE   Ketones, ur 15 (A) NEGATIVE mg/dL   Protein, ur NEGATIVE NEGATIVE mg/dL   Urobilinogen, UA 0.2 0.0 - 1.0 mg/dL   Nitrite POSITIVE (A) NEGATIVE   Leukocytes, UA SMALL (A) NEGATIVE  Ammonia  Result Value Ref Range   Ammonia 47 (H) 9 - 35 umol/L  Acetaminophen level  Result Value Ref Range   Acetaminophen (Tylenol), Serum <10 (L) 10 - 30 ug/mL  Salicylate level  Result Value Ref Range   Salicylate Lvl 6.3 2.8 - 30.0 mg/dL  Urine rapid drug screen (hosp performed)  Result Value Ref Range   Opiates NONE DETECTED NONE DETECTED   Cocaine NONE DETECTED NONE DETECTED   Benzodiazepines NONE DETECTED NONE DETECTED   Amphetamines NONE DETECTED NONE DETECTED   Tetrahydrocannabinol POSITIVE (A) NONE DETECTED   Barbiturates NONE DETECTED NONE DETECTED  Ethanol  Result Value Ref Range   Alcohol, Ethyl (B) <5 <5 mg/dL  Pregnancy, urine  Result Value Ref Range   Preg Test, Ur NEGATIVE NEGATIVE  Urine microscopic-add on  Result Value Ref Range   Squamous Epithelial / LPF RARE RARE   WBC, UA 3-6 <3 WBC/hpf   RBC / HPF 3-6 <3 RBC/hpf   Bacteria, UA RARE RARE   Urine-Other MUCOUS PRESENT   I-stat chem 8, ed  Result Value Ref Range   Sodium 136 135 - 145 mmol/L   Potassium 3.6 3.5 - 5.1 mmol/L   Chloride 100 (L) 101 - 111 mmol/L   BUN 13 6 - 20 mg/dL   Creatinine, Ser 9.56 0.44 - 1.00 mg/dL  Glucose, Bld 92 65 - 99 mg/dL   Calcium, Ion 2.13 0.86 - 1.23 mmol/L   TCO2 23 0 - 100 mmol/L   Hemoglobin 11.6 (L) 12.0 - 15.0 g/dL   HCT  57.8 (L) 46.9 - 46.0 %   Ct Head Wo Contrast  11/23/2014  CLINICAL DATA:  Acute onset bizarre behavior. History of cirrhosis and autoimmune hepatitis. EXAM: CT HEAD WITHOUT CONTRAST TECHNIQUE: Contiguous axial images were obtained from the base of the skull through the vertex without intravenous contrast. COMPARISON:  CT head at September 11, 2014 FINDINGS: The ventricles and sulci are normal. No intraparenchymal hemorrhage, mass effect nor midline shift. No acute large vascular territory infarcts. Cerebellar tonsils at but not below the foramen magnum. No abnormal extra-axial fluid collections. Basal cisterns are patent. No skull fracture. The included ocular globes and orbital contents are non-suspicious. The mastoid aircells and included paranasal sinuses are well-aerated. IMPRESSION: Normal noncontrast CT head. Electronically Signed   By: Awilda Metro M.D.   On: 11/23/2014 04:35   Ct Abdomen Pelvis W Contrast  11/20/2014  CLINICAL DATA:  Epigastric abdominal pain. History of SBP. Known cirrhosis. EXAM: CT ABDOMEN AND PELVIS WITH CONTRAST TECHNIQUE: Multidetector CT imaging of the abdomen and pelvis was performed using the standard protocol following bolus administration of intravenous contrast. CONTRAST:  OMNIPAQUE IOHEXOL 300 MG/ML  SOLN COMPARISON:  Most recent CT 09/05/2014 FINDINGS: Lower chest: The included lung bases are clear. No pleural effusion. Liver: Nodular hepatic contours consistent with known cirrhosis. Punctate hepatic granuloma anteriorly. No focal lesion is seen. Hepatobiliary: Gallbladder physiologically distended, no calcified stone. Pancreas:  Normal. Spleen: Splenomegaly, greatest dimension of 16.5 cm with splenic volume of 1028 mL. Multiple peripheral splenic infarcts are unchanged from prior. Adrenal glands: No nodule, the left adrenal gland is partially obscured. Kidneys: Symmetric renal enhancement.  No hydronephrosis. Stomach/Bowel: Stomach physiologically distended.  There is submucosal edema involving the right and transverse colon. Diffuse enhancement of small bowel. Small volume of colonic stool. No bowel dilatation. Vascular/Lymphatic: Porta systemic varicosities noted. Multiple perigastric and paraesophageal varices. Splenic varices with splenorenal shunting. No retroperitoneal adenopathy. Abdominal aorta is normal in caliber. Circum aortic left renal vein incidentally noted. Reproductive: Uterus is normal in size. The ovaries are symmetric in size. Bladder: Physiologically distended. Calcification at the dome is unchanged, the previous year apical nodule was less well-defined but grossly unchanged in size. Other: Large volume of intra-abdominal ascites, simple fluid density. The degree of ascites is increased from prior. There is ascites within an umbilical hernia. Musculoskeletal: There are no acute or suspicious osseous abnormalities. IMPRESSION: 1. Diffuse mucosal enhancement involving the small bowel. Suspect secondary to hypoproteinemia, and progressed from prior exam. 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 2. Large volume intra-abdominal ascites, increased. 3. Cirrhosis with portosystemic shunting, multiple varices, and splenomegaly with multiple infarcts, again seen. Electronically Signed   By: Rubye Oaks M.D.   On: 11/20/2014 00:47   US Paracentesis  11/20/2014  CLINICAL DATA:  Cirrhosis. Recurrent ascites. Request therapeutic paracentesis. EXAM: ULTRASOUND GUIDED PARACENTESIS COMPARISON:  None. PROCEDURE: An ultrasound guided paracentesis was thoroughly discussed with the patient and questions answered. The benefits, risks, alternatives and complications were also discussed. The patient understands and wishes to proceed with the procedure. Written consent was obtained. Ultrasound was performed to localize and mark an adequate pocket of fluid in the left lower quadrant of the abdomen. The area was then  prepped and draped in the normal sterile fashion.  1% Lidocaine was used for local anesthesia. Under ultrasound guidance a 19 gauge Yueh catheter was introduced. Paracentesis was performed. The catheter was removed and a dressing applied. COMPLICATIONS: None immediate FINDINGS: A total of approximately 5.3 L of clear yellow fluid was removed. A fluid sample was not sent for laboratory analysis. IMPRESSION: Successful ultrasound guided paracentesis yielding 5.3 L of ascites. Read by: Brayton ElKevin Bruning PA-C Electronically Signed   By: Judie PetitM.  Shick M.D.   On: 11/20/2014 10:15    Medications  alum & mag hydroxide-simeth (MAALOX/MYLANTA) 200-200-20 MG/5ML suspension 30 mL (not administered)  ondansetron (ZOFRAN) tablet 4 mg (not administered)  ibuprofen (ADVIL,MOTRIN) tablet 600 mg (not administered)  hydrOXYzine (ATARAX/VISTARIL) tablet 10 mg (not administered)  hydrOXYzine (ATARAX/VISTARIL) tablet 10 mg (10 mg Oral Given 11/23/14 [id number])   Based on discussion with pharmacist, vistaril is the only renally cleared medication that can be given for acute psychiatric issues without inducing P450 in the liver.  Will give small aliquots of same.   Labs look as well or better than I have ever seen them.  She is medically clear.  I am not sure if this is a substance induced phenomenon or whether she has psychosis but will need to be dispositioned per TTS  700 am, now speaking and recognizing and responding to surroundings.  Still have repetitive tapping of face but is cognizant of surroundings and is now answering questions appropriately   I personally performed the services described in this documentation, which was scribed in my presence. The recorded information has been reviewed and is accurate.      Cy BlamerApril Idaly Verret, MD 11/23/14 0449  Jaevion Goto, MD 11/23/14 [phone]

## 2014-11-22 NOTE — ED Notes (Signed)
Per EMS, pt boyfriend called for transport, Pt having sudden onset of  bizarre behavior. Reported that patient was chanting and taking off her clothes, dancing, touching herself, continuously moving. Reported that this incident happened  Once before.  VS: HR:90 RR:18 BP: 102/70 CBG: 106

## 2014-11-23 ENCOUNTER — Emergency Department (HOSPITAL_COMMUNITY): Payer: BC Managed Care – PPO

## 2014-11-23 ENCOUNTER — Encounter (HOSPITAL_COMMUNITY): Payer: Self-pay | Admitting: Emergency Medicine

## 2014-11-23 DIAGNOSIS — F29 Unspecified psychosis not due to a substance or known physiological condition: Secondary | ICD-10-CM | POA: Diagnosis not present

## 2014-11-23 LAB — BODY FLUID CULTURE
Culture: NO GROWTH
SPECIAL REQUESTS: NORMAL

## 2014-11-23 LAB — URINALYSIS, ROUTINE W REFLEX MICROSCOPIC
GLUCOSE, UA: NEGATIVE mg/dL
Ketones, ur: 15 mg/dL — AB
Nitrite: POSITIVE — AB
Protein, ur: NEGATIVE mg/dL
SPECIFIC GRAVITY, URINE: 1.026 (ref 1.005–1.030)
Urobilinogen, UA: 0.2 mg/dL (ref 0.0–1.0)
pH: 6 (ref 5.0–8.0)

## 2014-11-23 LAB — I-STAT CHEM 8, ED
BUN: 13 mg/dL (ref 6–20)
CHLORIDE: 100 mmol/L — AB (ref 101–111)
CREATININE: 0.5 mg/dL (ref 0.44–1.00)
Calcium, Ion: 1.17 mmol/L (ref 1.12–1.23)
GLUCOSE: 92 mg/dL (ref 65–99)
HCT: 34 % — ABNORMAL LOW (ref 36.0–46.0)
Hemoglobin: 11.6 g/dL — ABNORMAL LOW (ref 12.0–15.0)
POTASSIUM: 3.6 mmol/L (ref 3.5–5.1)
Sodium: 136 mmol/L (ref 135–145)
TCO2: 23 mmol/L (ref 0–100)

## 2014-11-23 LAB — RAPID URINE DRUG SCREEN, HOSP PERFORMED
AMPHETAMINES: NOT DETECTED
Barbiturates: NOT DETECTED
Benzodiazepines: NOT DETECTED
COCAINE: NOT DETECTED
OPIATES: NOT DETECTED
TETRAHYDROCANNABINOL: POSITIVE — AB

## 2014-11-23 LAB — URINE MICROSCOPIC-ADD ON

## 2014-11-23 LAB — CBC WITH DIFFERENTIAL/PLATELET
BASOS ABS: 0 10*3/uL (ref 0.0–0.1)
Basophils Relative: 0 %
EOS PCT: 1 %
Eosinophils Absolute: 0 10*3/uL (ref 0.0–0.7)
HEMATOCRIT: 32 % — AB (ref 36.0–46.0)
Hemoglobin: 10.8 g/dL — ABNORMAL LOW (ref 12.0–15.0)
LYMPHS PCT: 12 %
Lymphs Abs: 0.6 10*3/uL — ABNORMAL LOW (ref 0.7–4.0)
MCH: 34.3 pg — ABNORMAL HIGH (ref 26.0–34.0)
MCHC: 33.8 g/dL (ref 30.0–36.0)
MCV: 101.6 fL — AB (ref 78.0–100.0)
MONOS PCT: 11 %
Monocytes Absolute: 0.5 10*3/uL (ref 0.1–1.0)
NEUTROS ABS: 3.6 10*3/uL (ref 1.7–7.7)
Neutrophils Relative %: 76 %
Platelets: 74 10*3/uL — ABNORMAL LOW (ref 150–400)
RBC: 3.15 MIL/uL — AB (ref 3.87–5.11)
RDW: 18.7 % — AB (ref 11.5–15.5)
WBC: 4.7 10*3/uL (ref 4.0–10.5)

## 2014-11-23 LAB — HEPATIC FUNCTION PANEL
ALK PHOS: 411 U/L — AB (ref 38–126)
ALT: 70 U/L — AB (ref 14–54)
AST: 102 U/L — ABNORMAL HIGH (ref 15–41)
Albumin: 3.2 g/dL — ABNORMAL LOW (ref 3.5–5.0)
BILIRUBIN DIRECT: 4.6 mg/dL — AB (ref 0.1–0.5)
BILIRUBIN INDIRECT: 4.8 mg/dL — AB (ref 0.3–0.9)
Total Bilirubin: 9.4 mg/dL — ABNORMAL HIGH (ref 0.3–1.2)
Total Protein: 6.3 g/dL — ABNORMAL LOW (ref 6.5–8.1)

## 2014-11-23 LAB — SALICYLATE LEVEL: Salicylate Lvl: 6.3 mg/dL (ref 2.8–30.0)

## 2014-11-23 LAB — ACETAMINOPHEN LEVEL: Acetaminophen (Tylenol), Serum: <10 ug/mL — ABNORMAL LOW (ref 10–30)

## 2014-11-23 LAB — ETHANOL: Alcohol, Ethyl (B): <5 mg/dL (ref <5)

## 2014-11-23 LAB — PROTIME-INR
INR: 1.29 (ref 0.00–1.49)
Prothrombin Time: 16.3 seconds — ABNORMAL HIGH (ref 11.6–15.2)

## 2014-11-23 LAB — AMMONIA: AMMONIA: 47 umol/L — AB (ref 9–35)

## 2014-11-23 LAB — PREGNANCY, URINE: Preg Test, Ur: NEGATIVE

## 2014-11-23 MED ORDER — LACTULOSE 10 GM/15ML PO SOLN
10.0000 g | Freq: Two times a day (BID) | ORAL | Status: DC
  Administered 2014-11-23 – 2014-11-24 (×2): 10 g via ORAL
  Filled 2014-11-23 (×5): qty 15

## 2014-11-23 MED ORDER — HYDROXYZINE HCL 10 MG PO TABS
10.0000 mg | ORAL_TABLET | Freq: Once | ORAL | Status: AC
  Administered 2014-11-23: 10 mg via ORAL
  Filled 2014-11-23: qty 1

## 2014-11-23 MED ORDER — AZATHIOPRINE 50 MG PO TABS
50.0000 mg | ORAL_TABLET | Freq: Every day | ORAL | Status: DC
  Administered 2014-11-24: 50 mg via ORAL
  Filled 2014-11-23 (×2): qty 1

## 2014-11-23 MED ORDER — LORAZEPAM 2 MG/ML IJ SOLN
2.0000 mg | Freq: Once | INTRAMUSCULAR | Status: AC
  Administered 2014-11-23: 2 mg via INTRAMUSCULAR
  Filled 2014-11-23: qty 1

## 2014-11-23 MED ORDER — LACTULOSE 10 GM/15ML PO SOLN: 20.0000 g | Freq: Two times a day (BID) | ORAL | Status: DC

## 2014-11-23 MED ORDER — PREDNISONE 5 MG PO TABS
5.0000 mg | ORAL_TABLET | Freq: Every day | ORAL | Status: DC
  Administered 2014-11-24 – 2014-11-25 (×2): 5 mg via ORAL
  Filled 2014-11-23 (×3): qty 1

## 2014-11-23 MED ORDER — ONDANSETRON HCL 4 MG PO TABS: 4.0000 mg | ORAL_TABLET | Freq: Three times a day (TID) | ORAL | Status: DC | PRN

## 2014-11-23 MED ORDER — ALUM & MAG HYDROXIDE-SIMETH 200-200-20 MG/5ML PO SUSP: 30.0000 mL | ORAL | Status: DC | PRN

## 2014-11-23 MED ORDER — URSODIOL 300 MG PO CAPS
600.0000 mg | ORAL_CAPSULE | Freq: Every day | ORAL | Status: DC
  Administered 2014-11-24 – 2014-11-25 (×2): 600 mg via ORAL
  Filled 2014-11-23 (×3): qty 2

## 2014-11-23 MED ORDER — PANTOPRAZOLE SODIUM 40 MG PO TBEC
40.0000 mg | DELAYED_RELEASE_TABLET | Freq: Every day | ORAL | Status: DC
  Administered 2014-11-24 – 2014-11-25 (×2): 40 mg via ORAL
  Filled 2014-11-23 (×2): qty 1

## 2014-11-23 MED ORDER — VALGANCICLOVIR HCL 450 MG PO TABS
900.0000 mg | ORAL_TABLET | Freq: Two times a day (BID) | ORAL | Status: DC
  Administered 2014-11-23 – 2014-11-24 (×2): 900 mg via ORAL
  Filled 2014-11-23 (×3): qty 2

## 2014-11-23 MED ORDER — RIFAXIMIN 550 MG PO TABS
550.0000 mg | ORAL_TABLET | Freq: Two times a day (BID) | ORAL | Status: DC
  Administered 2014-11-23 – 2014-11-25 (×4): 550 mg via ORAL
  Filled 2014-11-23 (×7): qty 1

## 2014-11-23 MED ORDER — IBUPROFEN 200 MG PO TABS: 600.0000 mg | ORAL_TABLET | Freq: Three times a day (TID) | ORAL | Status: DC | PRN

## 2014-11-23 NOTE — ED Notes (Signed)
Pt given ativan IM 2mg  in right deltoid for agitation. IVC papers faxed to magistrate-awaiting call back. Patient calling boyfriend for ride home. Boyfriend called unit irate about staff giving medications not [facility]. Patient is crying about going home and wanting to sign out AMA.

## 2014-11-23 NOTE — Progress Notes (Signed)
CSW faxed IVC paperwork to night time magistrate. Magistrate states fax has not come through yet. CSW sent fax [phone].  The patient has not been served yet. CSW made nurse aware.  Trish MageBrittney Bernita Beckstrom, LCSWA [phone] ED CSW 11/23/2014 6:38 PM

## 2014-11-23 NOTE — ED Notes (Signed)
Pt humming, awake, sitter at bedside.

## 2014-11-23 NOTE — ED Notes (Signed)
Please call boyfriend [phone] after IVC papers are served and accepted.

## 2014-11-23 NOTE — Consult Note (Signed)
[facility] Face-to-Face Psychiatry Consult   Reason for Consult:  Bizarre behavior Referring Physician:  WLED provider Patient Identification: Melanie Conley MRN:  [id number] Principal Diagnosis: Psychosis Diagnosis:   Patient Active Problem List   Diagnosis Date Noted  . Psychosis [F29] 11/23/2014  . Splenomegaly [R16.1] 11/20/2014  . Ascites [R18.8]   . Pain [R52]   . Renal failure [N19]   . Encounter for palliative care [Z51.5]   . Severe sepsis with acute organ dysfunction (HCC) [A41.9, R65.20] 09/11/2014  . Acute respiratory failure with hypercapnia (HCC) [J96.02]   . Coagulopathy (HCC) [D68.9]   . Other cirrhosis of liver (HCC) [K74.69]   . Gastric varix [I86.4]   . Colitis [K52.9] 09/05/2014  . Hepatic encephalopathy (HCC) [K72.90] 09/05/2014  . Acute blood loss anemia [D62] 09/05/2014  . Irregular periods [N92.6] 08/04/2014  . 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 Cirrhosis (HCC) [K74.60]   . Pyrexia [R50.9] 06/04/2014  . Nausea and vomiting [R11.2] 06/04/2014  . 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 Hypoalbuminemia [E88.09] 06/04/2014  . Pancytopenia (HCC) [D61.818]   . Jaundice [R17]   . Sepsis (HCC) [A41.9] 05/28/2014  . Generalized abdominal pain [R10.84]   . Spontaneous bacterial peritonitis (HCC) [K65.2] 05/27/2014  . Leukopenia [D72.819] 05/27/2014  . Hypokalemia [E87.6]   . Neutropenia (HCC) [D70.9]   . SBP (spontaneous bacterial peritonitis) (HCC) [K65.2] 01/14/2014  . 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 Protein-calorie malnutrition, severe (HCC) [E43] 01/13/2014  . Abdominal pain, acute [R10.0] 01/12/2014  . Abdominal pain [R10.9] 01/12/2014  . Anemia [D64.9]   . GI bleeding [K92.2] 07/22/2011    Total Time spent with patient: 45 minutes  Subjective:   Melanie Conley is a 24 y.o. female patient admitted with bizarre behavior.  HPI:  Per earlier TTS note:  Melanie Conley is an African-American, single 24 y.o. female  presenting to [facility] via EMS for sudden onset of bizarre behavior. Per EMS, pt's boyfriend called for transport. He says that the pt was chanting, taking off her clothes, dancing, touching herself, and continuously moving. He states that pt has had a psychotic episode such as this once before. When this writer attempts to complete behavioral health assessment, pt is noted to be rocking back and forth, humming, and keeping her eyes closed. She is unresponsive to her name or any questions. She is disheveled and appears anxious and restless. She also appears to be responding to internal stimuli and is actively psychotic. Pt apparently pulled some of her hair out, as a large piece of her hair is on the floor in her ED room. Pt is not oriented. This writer was unable to obtain much pt hx beyond what is located in her chart. Pt has no previous psychiatric admissions to [facility], per chart review. Chart also shows that pt has multiple medical problems and is adopted. UDS is positive for THC and BAL is insignificant upon arrival to ED.  Patient was seen today, this Clinical research associate along with Dr Jannifer Franklin attempted to get information from patient.  Patient seemed to respond to an internal stimulus.  She is picking at her blankets and calling for "J" her BF.  She is unable to recall her medication list.  She appears confused.    Past Psychiatric History:  See HPI  Risk to Self: Suicidal Ideation: No Suicidal Intent: No Is patient at risk for suicide?: No Suicidal Plan?: No Access to Means:  (UTA) What has been your use of drugs/alcohol within the last  12 months?: THC use How many times?:  (UTA) Other Self Harm Risks:  (UTA) Triggers for Past Attempts: Unknown Intentional Self Injurious Behavior: None Risk to Others: Homicidal Ideation: No Thoughts of Harm to Others: No Current Homicidal Intent: No Current Homicidal Plan: No Access to Homicidal Means: No Identified Victim: n/a History of harm to others?:   (UTA) Assessment of Violence: None Noted Violent Behavior Description: No known hx of violence. Does patient have access to weapons?: No Criminal Charges Pending?: No Does patient have a court date: No Prior Inpatient Therapy: Prior Inpatient Therapy:  (UTA) Prior Therapy Dates: na Prior Therapy Facilty/Provider(s): na Reason for Treatment: na Prior Outpatient Therapy: Prior Outpatient Therapy:  (UTA) Prior Therapy Dates: na Prior Therapy Facilty/Provider(s): na Reason for Treatment: na Does patient have an ACCT team?: No Does patient have Intensive In-House Services?  : No Does patient have [facility] services? : No Does patient have P4CC services?: No  Past Medical History:  Past Medical History  Diagnosis Date  . Anemia 2012  . Esophageal varices (HCC) 2015    s/p multiple EGDs and bandings dating to 2015.   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 GIB (gastrointestinal bleeding)   . Cirrhosis (HCC) 2003  . Irregular periods 08/04/2014  . Vaginal Pap smear, abnormal   . Hepatic encephalopathy (HCC)   . Coagulopathy (HCC)   . STD (female)     mutiple. risky sexual behavior.   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 Ascites 2003    Past Surgical History  Procedure Laterality Date  . Gastric banding port revision    . Tonsillectomy    . Esophagogastroduodenoscopy  07/22/2011    Procedure: ESOPHAGOGASTRODUODENOSCOPY (EGD);  Surgeon: Graylin Shiver, MD;  Location: [facility] ENDOSCOPY;  Service: Endoscopy;  Laterality: N/A;  . Esophagogastroduodenoscopy N/A 09/11/2014    Procedure: ESOPHAGOGASTRODUODENOSCOPY (EGD);  Surgeon: Charlott Rakes, MD;  Location: [facility] ENDOSCOPY;  Service: Endoscopy;  Laterality: N/A;   Family History:  Family History  Problem Relation Age of Onset  . Adopted: Yes   Family Psychiatric  History:  No previous psych history Social History:  History  Alcohol Use No     History  Drug Use No    Social  History   Social History  . Marital Status: Single    Spouse Name: N/A  . Number of Children: N/A  . Years of Education: N/A   Social History Main Topics  . Smoking status: Former Smoker -- 0.00 packs/day for 0 years  . Smokeless tobacco: Never Used  . Alcohol Use: No  . Drug Use: No  . Sexual Activity: Yes   Other Topics Concern  . 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       History of Present Illness: Patient is a pleasant 24 year old [place] American female who was diagnosed with liver disease at age 45 when she presented with a GI bleed. She has undergone liver biopsy and ERCP in 2003. 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 Her complications of cirrhosis include bleeding esophageal varicies, ascites, SBP and encephalopathy. She was listed for OLT at [facility] in 2006 but made status 7 due to a low MELD score and a good quality of life. We have been unable to  reactivate her as an adult because she has not passed the psychosocial evaluation. She has undergone formal neuropsych testing. In the past, she has stated that she does not wish to undergo OLT even if she needs it. In regards to her AIH, her serologies are negative and she has been on Imuran and prednisone since her diagnosis in 2003. Regarding her PSC, she has had one bout of cholangitis years ago per report. She denies a history of stenting or dominant biliary stricture or choledolcholithiasis. She was previously on high dose ursodiol.       She presents today with her Mom. I last saw her in Feb 2015. She has been hospitalized [facility] at [facility] for abdominal pain/worsening ascites in 11/2013 and the again at [facility] in 12/2013 for SBP despite being on Levoquin prophylaxis. Her outside records from 12/2013 were reviewed in Care Everywhere. Ascites fluid showed 7866 nuc cells with 83% polys. Blood Ctx  were negative. She had 3 days of IV Rocephin with IV albumin on D1 and D3 as well as 7 days of Augmentin. She also had a 2.7L paracentesis. Stool studies were neg (C diff and immunocompromised host panel). She also had a CT scan which was unremarkable for acute event- patent PV. She denies any worsening jaundice, pruritis, SOB, CP, increased abdominal girth, LE edema, melena, BRBPR, confusion, depression, nausea, vomiting, or diarrhea. Her abdominal pain has resolved and her ascites is well controlled on aldactone 100mg  and lasix 40mg  daily. She is following a low Na diet.      Cirrhosis Care:    1.HCC screen: MRI 09/06/13 and CT scan 12/13/13 cirrhosis, ascites, marked splenomegaly, no masses, biliary stricturing   2.Varicies surveillance: EGD 07/17/13 Grade II esophageal varices with EVL X 2. Missed follow-up.   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    -- Liver biopsy 08/03/01 minute fragments bile duct proliferation and periductular fibrosis; hepatitis G1-2, S2; ERCP 09/23/01 Subtle changes/narrowing but no dominant strictures; MRCP images 10/08 showed focal narrowing in right hepatic system; serologies ANA neg ASMA neg, antiLKMI neg AMA neg IgG normal (low level + ASMA 1:20 in 2003)    -- Occult GI bleeding with many EGD's, VCE 11/09 normal, Colonoscopy 7/06 and ?2/10 with biopsies negative for IBD including TI.    -- seasonal allergies    -- s/p tonsilectomy    -- Immune to HAV and HBV (2007)    -- EBV IgG pos CMV IgG and IgM neg       Social History:    [place], [place] with parents sister and brother. Melanie GibbsZubrina is the middle child (she is adopted).   Her Mom teaches Ambulance personnglish Special Education.    No tobacco or alcohol.     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   Pets: 1 dog and 1 cat       Additional Social History:    Pain Medications: See PTA List Prescriptions: See PTA List Over the Counter: See PTA List History of alcohol / drug use?: Yes Longest period of sobriety (when/how long): UTA Name of Substance 1: THC 1 - Age of First Use: UTA 1 - Amount (  size/oz): UTA 1 - Frequency: UTA 1 - Duration: UTA 1 - Last Use / Amount: Tested positive on 11/22/14                   Allergies:   Allergies  Allergen Reactions  . 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 (pt was in the hospital when this occurred - May 2016) Has patient had a PCN reaction causing immediate rash, facial/tongue/throat swelling, SOB or lightheadedness with hypotension: No Has patient had a PCN reaction causing severe rash involving mucus membranes or skin necrosis: No Has patient had a PCN reaction that required hospitalization Yes Has patient had a PCN reaction occurring within the last 10 years: Yes If all of the above answers are "NO", then moay proceed with Ceph    Labs:  Results for orders placed or performed during the hospital encounter of 11/22/14 (from the past 48 hour(s))  CBC with Differential/Platelet     Status: Abnormal   Collection Time: 11/23/14 12:12 AM  Result Value Ref Range   WBC 4.7 4.0 - 10.5 K/uL   RBC 3.15 (L) 3.87 - 5.11 MIL/uL   Hemoglobin 10.8 (L) 12.0 - 15.0 g/dL   HCT 16.1 (L) 09.6 - 04.5 %   MCV 101.6 (H) 78.0 - 100.0 fL   MCH 34.3 (H) 26.0 - 34.0 pg   MCHC 33.8 30.0 - 36.0 g/dL   RDW 40.9 (H) 81.1 - 91.4 %   Platelets 74 (L) 150 - 400 K/uL    Comment: SPECIMEN CHECKED FOR CLOTS REPEATED TO VERIFY PLATELET COUNT CONFIRMED BY SMEAR    Neutrophils Relative % 76 %   Lymphocytes Relative 12 %   Monocytes Relative 11 %   Eosinophils Relative 1 %   Basophils Relative 0 %    Neutro Abs 3.6 1.7 - 7.7 K/uL   Lymphs Abs 0.6 (L) 0.7 - 4.0 K/uL   Monocytes Absolute 0.5 0.1 - 1.0 K/uL   Eosinophils Absolute 0.0 0.0 - 0.7 K/uL   Basophils Absolute 0.0 0.0 - 0.1 K/uL  Hepatic function panel     Status: Abnormal   Collection Time: 11/23/14 12:12 AM  Result Value Ref Range   Total Protein 6.3 (L) 6.5 - 8.1 g/dL   Albumin 3.2 (L) 3.5 - 5.0 g/dL   AST 782 (H) 15 - 41 U/L   ALT 70 (H) 14 - 54 U/L   Alkaline Phosphatase 411 (H) 38 - 126 U/L   Total Bilirubin 9.4 (H) 0.3 - 1.2 mg/dL   Bilirubin, Direct 4.6 (H) 0.1 - 0.5 mg/dL   Indirect Bilirubin 4.8 (H) 0.3 - 0.9 mg/dL  Protime-INR     Status: Abnormal   Collection Time: 11/23/14 12:12 AM  Result Value Ref Range   Prothrombin Time 16.3 (H) 11.6 - 15.2 seconds   INR 1.29 0.00 - 1.49  Urinalysis, Routine w reflex microscopic (not at [facility])     Status: Abnormal   Collection Time: 11/23/14 12:12 AM  Result Value Ref Range   Color, Urine ORANGE (A) YELLOW    Comment: BIOCHEMICALS MAY BE AFFECTED BY COLOR   APPearance CLOUDY (A) CLEAR   Specific Gravity, Urine 1.026 1.005 - 1.030   pH 6.0 5.0 - 8.0   Glucose, UA NEGATIVE NEGATIVE mg/dL   Hgb urine dipstick MODERATE (A) NEGATIVE   Bilirubin Urine MODERATE (A) NEGATIVE   Ketones, ur 15 (A) NEGATIVE mg/dL   Protein, ur NEGATIVE NEGATIVE mg/dL   Urobilinogen, UA 0.2 0.0 -  1.0 mg/dL   Nitrite POSITIVE (A) NEGATIVE   Leukocytes, UA SMALL (A) NEGATIVE  Ammonia     Status: Abnormal   Collection Time: 11/23/14 12:12 AM  Result Value Ref Range   Ammonia 47 (H) 9 - 35 umol/L  Acetaminophen level     Status: Abnormal   Collection Time: 11/23/14 12:12 AM  Result Value Ref Range   Acetaminophen (Tylenol), Serum <10 (L) 10 - 30 ug/mL    Comment:        THERAPEUTIC CONCENTRATIONS VARY SIGNIFICANTLY. A RANGE OF 10-30 ug/mL MAY BE AN EFFECTIVE CONCENTRATION FOR MANY PATIENTS. HOWEVER, SOME ARE BEST TREATED AT CONCENTRATIONS OUTSIDE THIS RANGE. ACETAMINOPHEN  CONCENTRATIONS >150 ug/mL AT 4 HOURS AFTER INGESTION AND >50 ug/mL AT 12 HOURS AFTER INGESTION ARE OFTEN ASSOCIATED WITH TOXIC REACTIONS.   Salicylate level     Status: None   Collection Time: 11/23/14 12:12 AM  Result Value Ref Range   Salicylate Lvl 6.3 2.8 - 30.0 mg/dL  Ethanol     Status: None   Collection Time: 11/23/14 12:12 AM  Result Value Ref Range   Alcohol, Ethyl (B) <5 <5 mg/dL    Comment:        LOWEST DETECTABLE LIMIT FOR SERUM ALCOHOL IS 5 mg/dL FOR MEDICAL PURPOSES ONLY   Urine microscopic-add on     Status: None   Collection Time: 11/23/14 12:12 AM  Result Value Ref Range   Squamous Epithelial / LPF RARE RARE   WBC, UA 3-6 <3 WBC/hpf   RBC / HPF 3-6 <3 RBC/hpf   Bacteria, UA RARE RARE   Urine-Other MUCOUS PRESENT   Urine rapid drug screen (hosp performed)     Status: Abnormal   Collection Time: 11/23/14 12:18 AM  Result Value Ref Range   Opiates NONE DETECTED NONE DETECTED   Cocaine NONE DETECTED NONE DETECTED   Benzodiazepines NONE DETECTED NONE DETECTED   Amphetamines NONE DETECTED NONE DETECTED   Tetrahydrocannabinol POSITIVE (A) NONE DETECTED   Barbiturates NONE DETECTED NONE DETECTED    Comment:        DRUG SCREEN FOR MEDICAL PURPOSES ONLY.  IF CONFIRMATION IS NEEDED FOR ANY PURPOSE, NOTIFY LAB WITHIN 5 DAYS.        LOWEST DETECTABLE LIMITS FOR URINE DRUG SCREEN Drug Class       Cutoff (ng/mL) Amphetamine      1000 Barbiturate      200 Benzodiazepine   200 Tricyclics       300 Opiates          300 Cocaine          300 THC              50   I-stat chem 8, ed     Status: Abnormal   Collection Time: 11/23/14 12:26 AM  Result Value Ref Range   Sodium 136 135 - 145 mmol/L   Potassium 3.6 3.5 - 5.1 mmol/L   Chloride 100 (L) 101 - 111 mmol/L   BUN 13 6 - 20 mg/dL   Creatinine, Ser 1.61 0.44 - 1.00 mg/dL   Glucose, Bld 92 65 - 99 mg/dL   Calcium, Ion 0.96 0.45 - 1.23 mmol/L   TCO2 23 0 - 100 mmol/L   Hemoglobin 11.6 (L) 12.0 - 15.0 g/dL    HCT 40.9 (L) 81.1 - 46.0 %  Pregnancy, urine     Status: None   Collection Time: 11/23/14 12:27 AM  Result Value Ref Range   Preg Test, Ur  NEGATIVE NEGATIVE    Comment:        THE SENSITIVITY OF THIS METHODOLOGY IS >20 mIU/mL.     Current Facility-Administered Medications  Medication Dose Route Frequency Provider Last Rate Last Dose  . alum & mag hydroxide-simeth (MAALOX/MYLANTA) 200-200-20 MG/5ML suspension 30 mL  30 mL Oral PRN April Palumbo, MD      . ibuprofen (ADVIL,MOTRIN) tablet 600 mg  600 mg Oral Q8H PRN April Palumbo, MD      . lactulose (CHRONULAC) 10 GM/15ML solution 10 g  10 g Oral BID Linwood Dibbles, MD      . ondansetron [facility]) tablet 4 mg  4 mg Oral Q8H PRN April Palumbo, MD       Current Outpatient Prescriptions  Medication Sig Dispense Refill  . azaTHIOprine (IMURAN) 50 MG tablet Take 50 mg by mouth daily.     . furosemide (LASIX) 20 MG tablet Take 2 tablets (40 mg total) by mouth daily. 60 tablet 0  . lactulose (CHRONULAC) 10 GM/15ML solution Take 30 mLs (20 g total) by mouth 2 (two) times daily. (Patient taking differently: Take 20 g by mouth 3 (three) times daily. ) 240 mL 0  . metoCLOPramide (REGLAN) 5 MG tablet Take 5 mg by mouth 2 (two) times daily.     Marland Kitchen omeprazole (PRILOSEC) 40 MG capsule Take 40 mg by mouth daily.    Marland Kitchen oxyCODONE (ROXICODONE) 5 MG immediate release tablet Take 1 tablet (5 mg total) by mouth every 4 (four) hours as needed for severe pain. 10 tablet 0  . predniSONE (DELTASONE) 5 MG tablet Take 5 mg by mouth daily with breakfast.    . rifaximin (XIFAXAN) 550 MG TABS tablet Take 1 tablet (550 mg total) by mouth 2 (two) times daily. 60 tablet   . spironolactone (ALDACTONE) 50 MG tablet Take 150 mg by mouth daily.    Marland Kitchen sulfamethoxazole-trimethoprim (BACTRIM DS,SEPTRA DS) 800-160 MG per tablet Take 1 tablet by mouth daily.    . ursodiol (ACTIGALL) 300 MG capsule Take 600 mg by mouth daily.    . valGANciclovir (VALCYTE) 450 MG tablet Take 2 tablets  (900 mg total) by mouth 2 (two) times daily. 120 tablet 0  . carvedilol (COREG) 3.125 MG tablet Take 6.25 mg by mouth daily.    . magnesium oxide (MAG-OX) 400 MG tablet Take 400 mg by mouth daily.    . ondansetron (ZOFRAN ODT) 4 MG disintegrating tablet 4mg  ODT q4 hours prn nausea/vomit (Patient taking differently: Take 4 mg by mouth every 4 (four) hours as needed for nausea or vomiting. ) 10 tablet 0  . ondansetron (ZOFRAN) 4 MG tablet Take 1 tablet (4 mg total) by mouth every 8 (eight) hours as needed for nausea or vomiting. (Patient not taking: Reported on 11/23/2014) 20 tablet 0    Musculoskeletal: Strength & Muscle Tone: within normal limits Gait & Station: normal Patient leans: N/A  Psychiatric Specialty Exam: Review of Systems  Psychiatric/Behavioral: Positive for hallucinations. The patient is nervous/anxious.   All other systems reviewed and are negative.   Blood pressure 102/59, pulse 102, temperature 98.2 F (36.8 C), temperature source Oral, resp. rate 20, SpO2 100 %.There is no weight on file to calculate BMI.  General Appearance: Bizarre and Disheveled  Eye Contact::  Poor  Speech:  Blocked and Slurred  Volume:  Decreased  Mood:  NA  Affect:  Non-Congruent, Constricted and Full Range  Thought Process:  Disorganized, Loose and Tangential  Orientation:  Full (Time, Place, and Person)  Thought Content:  Hallucinations: Auditory  Suicidal Thoughts:  Denies  Homicidal Thoughts:  No  Memory:  Immediate;   Poor Recent;   Poor Remote;   Poor  Judgement:  Poor  Insight:  Lacking  Psychomotor Activity:  Normal  Concentration:  Negative  Recall:  Negative  Fund of Knowledge:Negative  Language: Good  Akathisia:  Negative  Handed:  Right  AIMS (if indicated):     Assets:  Resilience  ADL's:  Intact  Cognition: Impaired,  Moderate  Sleep:   poor   Treatment Plan Summary: Admit for crisis management and mood stabilization. Medication management to re-stabilize current  mood symptoms Group counseling sessions for coping skills Medical consults as needed Review and reinstate any pertinent home medications for other health problems   Disposition: Recommend psychiatric Inpatient admission when medically cleared. Supportive therapy provided about ongoing stressors. Refer to IOP. Discussed crisis plan, support from social network, calling 911, coming to the Emergency Department, and calling Suicide Hotline.  To note, discussed wuth Dr Lynelle Doctor concerns for patient's liver cirrhosis and autoimmune hep as the cause of her delirium and confusion.  Per Dr Lynelle Doctor, her liver status is chronic and she is medically cleared.  In reviewing the notes, patient on last admission was recommended for palliative consult.  It is unclear if this was completed.  In light of patient's long term and chronic liver cirrhosis and poor prognosis, patient would benefit from discussing this with patient's mother and it may yield some benefit.   Furthermore, spoke with Dr Radonna Ricker,  A Hospitalist and per Dr Karlyne Greenspan note on 11/20/14, it is ok to resume patient previous home regimen.    Velna Hatchet May Agustin AGNP-BC 11/23/2014 3:02 PM Patient seen face-to-face for psychiatric evaluation, chart reviewed and case discussed with the physician extender and developed treatment plan. Reviewed the information documented and agree with the treatment plan. Thedore Mins, MD

## 2014-11-23 NOTE — BH Assessment (Addendum)
Tele Assessment Note   Melanie Conley is an African-American, single 24 y.o. female [facility] for sudden onset of bizarre behavior. Per EMS, pt's boyfriend called for transport. He says that the pt was chanting, taking off her clothes, dancing, touching herself, and continuously moving. He states that pt has had a psychotic episode such as this once before. When this writer attempts to complete behavioral health assessment, pt is noted to be rocking back and forth, humming, and keeping her eyes closed. She is unresponsive to her name or any questions. She is disheveled and appears anxious and restless. She also appears to be responding to internal stimuli and is actively psychotic. Pt apparently pulled some of her hair out, as a large piece of her hair is on the floor in her ED room. Pt is not oriented. This writer was unable to obtain much pt hx beyond what is located in her chart. [facility], per chart review. Chart also shows that pt has multiple medical problems and is adopted. UDS is positive for THC and BAL is insignificant upon arrival to ED.  Disposition: Per Donell SievertSpencer Simon, PA, Attempt evaluation of pt in the morning by psychiatry.  Diagnosis: Unspecified psychotic disorder; R/O substance-induced psychosis  Past Medical History:  Past Medical History  Diagnosis Date  . Anemia 2012  . Esophageal varices (HCC) 2015    s/p multiple EGDs and bandings dating to 2015.   Marland Kitchen. 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 GIB (gastrointestinal bleeding)   . Cirrhosis (HCC) 2003  . Irregular periods 08/04/2014  . Vaginal Pap smear, abnormal   . Hepatic encephalopathy (HCC)   . Coagulopathy (HCC)   . STD (female)     mutiple. risky sexual behavior.   Marland Kitchen. 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 Ascites 2003    Past Surgical History  Procedure Laterality Date  . Gastric banding port revision    .  Tonsillectomy    . Esophagogastroduodenoscopy  07/22/2011    Procedure: ESOPHAGOGASTRODUODENOSCOPY (EGD);  Surgeon: Graylin ShiverSalem F Ganem, MD;  [facility];  Service: Endoscopy;  Laterality: N/A;  . Esophagogastroduodenoscopy N/A 09/11/2014    Procedure: ESOPHAGOGASTRODUODENOSCOPY (EGD);  Surgeon: Charlott RakesVincent Schooler, MD;  [facility];  Service: Endoscopy;  Laterality: N/A;    Family History:  Family History  Problem Relation Age of Onset  . Adopted: Yes    Social History:  reports that she has quit smoking. She has never used smokeless tobacco. She reports that she does not drink alcohol or use illicit drugs.  Additional Social History:  Alcohol / Drug Use Pain Medications: See PTA List Prescriptions: See PTA List Over the Counter: See PTA List History of alcohol / drug use?: Yes Longest period of sobriety (when/how long): UTA Substance #1 Name of Substance 1: THC 1 - Age of First Use: UTA 1 - Amount (size/oz): UTA 1 - Frequency: UTA 1 - Duration: UTA 1 - Last Use / Amount: Tested positive on 11/22/14  CIWA: CIWA-Ar BP: (!) 111/54 mmHg Pulse Rate: 91 COWS:    PATIENT STRENGTHS: (choose at least two) Average or above average intelligence Supportive family/friends  Allergies:  Allergies  Allergen Reactions  . 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 (pt was in the hospital when this occurred - May 2016) Has patient had a PCN reaction causing immediate rash, facial/tongue/throat swelling, SOB or lightheadedness with hypotension:  No Has patient had a PCN reaction causing severe rash involving mucus membranes or skin necrosis: No Has patient had a PCN reaction that required hospitalization Yes Has patient had a PCN reaction occurring within the last 10 years: Yes If all of the above answers are "NO", then moay proceed with Ceph    Home Medications:  (Not in a hospital admission)  OB/GYN Status:  No LMP recorded. Patient is not currently having  periods (Reason: Irregular Periods).  General Assessment Data Location of Assessment: [facility] ED TTS Assessment: In system Is this a Tele or Face-to-Face Assessment?: Face-to-Face Is this an Initial Assessment or a Re-assessment for this encounter?: Initial Assessment Marital status: Single Maiden name: Tillman Is patient pregnant?: No Pregnancy Status: No Living Arrangements:  (UTA) Can pt return to current living arrangement?: Yes Admission Status: Voluntary Is patient capable of signing voluntary admission?: Yes Referral Source: Self/Family/Friend Insurance type: BCBS     Crisis Care Plan Living Arrangements:  Industrial/product designer) Name of Psychiatrist: UTA Name of Therapist: UTA  Education Status Is patient currently in school?: No Current Grade: na Highest grade of school patient has completed: na Name of school: na Contact person: na  Risk to self with the past 6 months Suicidal Ideation: No Has patient been a risk to self within the past 6 months prior to admission? : Other (comment) (UTA) Suicidal Intent: No Has patient had any suicidal intent within the past 6 months prior to admission? : Other (comment) (UTA) Is patient at risk for suicide?: No Suicidal Plan?: No Has patient had any suicidal plan within the past 6 months prior to admission? : Other (comment) (UTA) Access to Means:  (UTA) What has been your use of drugs/alcohol within the last 12 months?: THC use Previous Attempts/Gestures:  (UTA) How many times?:  (UTA) Other Self Harm Risks:  (UTA) Triggers for Past Attempts: Unknown Intentional Self Injurious Behavior: None Family Suicide History: Unknown Recent stressful life event(s):  (UTA) Persecutory voices/beliefs?:  Rich Reining) Depression:  (UTA) Depression Symptoms:  (UTA) Substance abuse history and/or treatment for substance abuse?: Yes Suicide prevention information given to non-admitted patients: Not applicable  Risk to Others within the past 6 months Homicidal  Ideation: No Does patient have any lifetime risk of violence toward others beyond the six months prior to admission? : Unknown Thoughts of Harm to Others: No Current Homicidal Intent: No Current Homicidal Plan: No Access to Homicidal Means: No Identified Victim: n/a History of harm to others?:  (UTA) Assessment of Violence: None Noted Violent Behavior Description: No known hx of violence. Does patient have access to weapons?: No Criminal Charges Pending?: No Does patient have a court date: No Is patient on probation?: No  Psychosis Hallucinations:  (UTA) Delusions:  (UTA)  Mental Status Report Appearance/Hygiene: Disheveled Eye Contact: Poor Motor Activity: Agitation Speech: Elective mutism Level of Consciousness: Restless Mood: Anxious Affect: Anxious Anxiety Level: Severe Thought Processes: Unable to Assess Judgement: Unable to Assess Orientation: Not oriented Obsessive Compulsive Thoughts/Behaviors: Unable to Assess  Cognitive Functioning Concentration: Poor Memory: Unable to Assess IQ: Average Insight: Unable to Assess Impulse Control: Unable to Assess Appetite:  (UTA) Weight Loss: 0 Weight Gain: 0 Sleep: Unable to Assess Total Hours of Sleep:  (UTA) Vegetative Symptoms: Unable to Assess  ADLScreening [facility] Assessment Services) Patient's cognitive ability adequate to safely complete daily activities?: Yes Patient able to express need for assistance with ADLs?: Yes Independently performs ADLs?: Yes (appropriate for developmental age)  Prior Inpatient Therapy Prior Inpatient Therapy:  (  UTA) Prior Therapy Dates: na Prior Therapy Facilty/Provider(s): na Reason for Treatment: na  Prior Outpatient Therapy Prior Outpatient Therapy:  (UTA) Prior Therapy Dates: na Prior Therapy Facilty/Provider(s): na Reason for Treatment: na Does patient have an ACCT team?: No Does patient have Intensive In-House Services?  : No Does patient have [facility] services? : No Does  patient have P4CC services?: No  ADL Screening (condition at time of admission) Patient's cognitive ability adequate to safely complete daily activities?: Yes Is the patient deaf or have difficulty hearing?: No Does the patient have difficulty seeing, even when wearing glasses/contacts?: No Does the patient have difficulty concentrating, remembering, or making decisions?: No Patient able to express need for assistance with ADLs?: Yes Does the patient have difficulty dressing or bathing?: No Independently performs ADLs?: Yes (appropriate for developmental age) Does the patient have difficulty walking or climbing stairs?: No Weakness of Legs: None Weakness of Arms/Hands: None  Home Assistive Devices/Equipment Home Assistive Devices/Equipment: None    Abuse/Neglect Assessment (Assessment to be complete while patient is alone) Physical Abuse:  (UTA) Verbal Abuse:  (UTA) Sexual Abuse:  (UTA) Exploitation of patient/patient's resources: Denies Self-Neglect: Denies Values / Beliefs Cultural Requests During Hospitalization: None Spiritual Requests During Hospitalization: None   Advance Directives (For Healthcare) Does patient have an advance directive?: No Would patient like information on creating an advanced directive?: No - patient declined information    Additional Information 1:1 In Past 12 Months?: No CIRT Risk: No Elopement Risk: No Does patient have medical clearance?: Yes     Disposition: Per Donell Sievert, PA, Attempt evaluation of pt in the morning by psychiatry. Disposition Initial Assessment Completed for this Encounter: Yes Disposition of Patient: Other dispositions Other disposition(s): Other (Comment) (Attempt re-assess by psychiatry later this AM.)  Cyndie Mull, [facility] Triage Specialist  11/23/2014 5:51 AM

## 2014-11-23 NOTE — ED Notes (Signed)
Bed: WA32 Expected date:  Expected time:  Means of arrival:  Comments: 

## 2014-11-23 NOTE — ED Notes (Signed)
Unable to get any answer from patient. She gives thumbs up and down to respond to answers.

## 2014-11-24 DIAGNOSIS — F29 Unspecified psychosis not due to a substance or known physiological condition: Secondary | ICD-10-CM | POA: Diagnosis not present

## 2014-11-24 LAB — CULTURE, BLOOD (ROUTINE X 2)
CULTURE: NO GROWTH
Culture: NO GROWTH

## 2014-11-24 LAB — AMMONIA: AMMONIA: 50 umol/L — AB (ref 9–35)

## 2014-11-24 MED ORDER — LACTULOSE 10 GM/15ML PO SOLN
20.0000 g | Freq: Three times a day (TID) | ORAL | Status: DC
  Administered 2014-11-24 – 2014-11-25 (×2): 20 g via ORAL
  Filled 2014-11-24 (×4): qty 30

## 2014-11-24 MED ORDER — LIDOCAINE VISCOUS 2 % MT SOLN
15.0000 mL | Freq: Once | OROMUCOSAL | Status: AC
  Administered 2014-11-24: 15 mL via OROMUCOSAL
  Filled 2014-11-24: qty 15

## 2014-11-24 MED ORDER — GI COCKTAIL ~~LOC~~: 30.0000 mL | Freq: Two times a day (BID) | ORAL | Status: DC | PRN

## 2014-11-24 MED ORDER — SPIRONOLACTONE 50 MG PO TABS
150.0000 mg | ORAL_TABLET | Freq: Every day | ORAL | Status: DC
  Administered 2014-11-24 – 2014-11-25 (×2): 150 mg via ORAL
  Filled 2014-11-24 (×2): qty 1

## 2014-11-24 MED ORDER — METOCLOPRAMIDE HCL 10 MG PO TABS
5.0000 mg | ORAL_TABLET | Freq: Three times a day (TID) | ORAL | Status: DC
  Administered 2014-11-25 (×2): 5 mg via ORAL
  Filled 2014-11-24 (×2): qty 1

## 2014-11-24 MED ORDER — AZATHIOPRINE 50 MG PO TABS
25.0000 mg | ORAL_TABLET | Freq: Every day | ORAL | Status: DC
  Administered 2014-11-25: 25 mg via ORAL
  Filled 2014-11-24: qty 1

## 2014-11-24 MED ORDER — OXYCODONE HCL 5 MG PO TABS: 5.0000 mg | ORAL_TABLET | Freq: Once | ORAL | Status: DC

## 2014-11-24 NOTE — ED Notes (Signed)
Patient transferred to unit from TCU.  She is demanding to leave and does not believe we can hold her here or keep her from having her belongings.  I explained to patient that she was under IVC and that I was not able to allow her to sign herself out.  I explained the IVC process to her and what my role was as a Engineer, civil (consulting)nurse.  I did inform her that the psychiatrist would see her in the morning, but she must spend the night.  I also informed her that she was allowed to have a visitor for 30 minutes if she wanted to.  She is a bit argumentative and demanding, but did finally seem to listen to what I was saying.

## 2014-11-24 NOTE — BH Assessment (Signed)
[facility] Assessment Progress Note  The following facilities have been contacted to seek placement for this pt, with results as noted:  Beds available, information sent, decision pending:  [facility] ([facility] for Med Psych)   At capacity:     Doylene Canninghomas Monish [place], [place] Triage Specialist [phone]

## 2014-11-24 NOTE — Consult Note (Signed)
[facility] Face-to-Face Psychiatry Consult   Reason for Consult:  Psychosis  Referring Physician:  EDP Patient Identification: Melanie Conley MRN:  [id number] Principal Diagnosis: Psychosis Diagnosis:   Patient Active Problem List   Diagnosis Date Noted  . Psychosis [F29] 11/23/2014    Priority: High  . Splenomegaly [R16.1] 11/20/2014  . Ascites [R18.8]   . Pain [R52]   . Renal failure [N19]   . Encounter for palliative care [Z51.5]   . Severe sepsis with acute organ dysfunction (HCC) [A41.9, R65.20] 09/11/2014  . Acute respiratory failure with hypercapnia (HCC) [J96.02]   . Coagulopathy (HCC) [D68.9]   . Other cirrhosis of liver (HCC) [K74.69]   . Gastric varix [I86.4]   . Colitis [K52.9] 09/05/2014  . Hepatic encephalopathy (HCC) [K72.90] 09/05/2014  . Acute blood loss anemia [D62] 09/05/2014  . Irregular periods [N92.6] 08/04/2014  . 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 Cirrhosis (HCC) [K74.60]   . Pyrexia [R50.9] 06/04/2014  . Nausea and vomiting [R11.2] 06/04/2014  . 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 Hypoalbuminemia [E88.09] 06/04/2014  . Pancytopenia (HCC) [D61.818]   . Jaundice [R17]   . Sepsis (HCC) [A41.9] 05/28/2014  . Generalized abdominal pain [R10.84]   . Spontaneous bacterial peritonitis (HCC) [K65.2] 05/27/2014  . Leukopenia [D72.819] 05/27/2014  . Hypokalemia [E87.6]   . Neutropenia (HCC) [D70.9]   . SBP (spontaneous bacterial peritonitis) (HCC) [K65.2] 01/14/2014  . 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 Protein-calorie malnutrition, severe (HCC) [E43] 01/13/2014  . Abdominal pain, acute [R10.0] 01/12/2014  . Abdominal pain [R10.9] 01/12/2014  . Anemia [D64.9]   . GI bleeding [K92.2] 07/22/2011    Total Time spent with patient: 30 minutes  Subjective:   Melanie Conley is a 24 y.o. female patient admitted with psychosis.  HPI:  24 yo female with psychosis, auditory and visual hallucinations.  No history of psychiatric  disorder but multiple medical issues.  She was talking to someone in the room that was not there, disorganized at times.   Denies suicidal/homicidal ideations and alcohol abuse.  Marijuana use and methamphetamine "once".  Past Psychiatric History: None  Risk to Self: Suicidal Ideation: No Suicidal Intent: No Is patient at risk for suicide?: No Suicidal Plan?: No Access to Means:  (UTA) What has been your use of drugs/alcohol within the last 12 months?: THC use How many times?:  (UTA) Other Self Harm Risks:  (UTA) Triggers for Past Attempts: Unknown Intentional Self Injurious Behavior: None Risk to Others: Homicidal Ideation: No Thoughts of Harm to Others: No Current Homicidal Intent: No Current Homicidal Plan: No Access to Homicidal Means: No Identified Victim: n/a History of harm to others?:  (UTA) Assessment of Violence: None Noted Violent Behavior Description: No known hx of violence. Does patient have access to weapons?: No Criminal Charges Pending?: No Does patient have a court date: No Prior Inpatient Therapy: Prior Inpatient Therapy:  (UTA) Prior Therapy Dates: na Prior Therapy Facilty/Provider(s): na Reason for Treatment: na Prior Outpatient Therapy: Prior Outpatient Therapy:  (UTA) Prior Therapy Dates: na Prior Therapy Facilty/Provider(s): na Reason for Treatment: na Does patient have an ACCT team?: No Does patient have Intensive In-House Services?  : No Does patient have [facility] services? : No Does patient have P4CC services?: No  Past Medical History:  Past Medical History  Diagnosis Date  . Anemia 2012  . Esophageal varices (HCC) 2015    s/p multiple EGDs and bandings dating to 2015.   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 GIB (gastrointestinal bleeding)   . Cirrhosis (HCC) 2003  . Irregular periods 08/04/2014  . Vaginal Pap smear, abnormal   . Hepatic encephalopathy (HCC)   . Coagulopathy (HCC)   . STD  (female)     mutiple. risky sexual behavior.   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 Ascites 2003    Past Surgical History  Procedure Laterality Date  . Gastric banding port revision    . Tonsillectomy    . Esophagogastroduodenoscopy  07/22/2011    Procedure: ESOPHAGOGASTRODUODENOSCOPY (EGD);  Surgeon: Graylin Shiver, MD;  Location[facility] ENDOSCOPY;  Service: Endoscopy;  Laterality: N/A;  . Esophagogastroduodenoscopy N/A 09/11/2014    Procedure: ESOPHAGOGASTRODUODENOSCOPY (EGD);  Surgeon: Charlott Rakes, MD;  Location: [facility] ENDOSCOPY;  Service: Endoscopy;  Laterality: N/A;   Family History:  Family History  Problem Relation Age of Onset  . Adopted: Yes   Family Psychiatric  History: None Social History:  History  Alcohol Use No     History  Drug Use No    Social History   Social History  . Marital Status: Single    Spouse Name: N/A  . Number of Children: N/A  . Years of Education: N/A   Social History Main Topics  . Smoking status: Former Smoker -- 0.00 packs/day for 0 years  . Smokeless tobacco: Never Used  . Alcohol Use: No  . Drug Use: No  . Sexual Activity: Yes   Other Topics Concern  . 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       History of Present Illness: Patient is a pleasant 24 year old [place] American female who was diagnosed with liver disease at age 49 when she presented with a GI bleed. She has undergone liver biopsy and ERCP in 2003. 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 Her complications of cirrhosis include bleeding esophageal varicies, ascites, SBP and encephalopathy. She was listed for OLT at [facility] in 2006 but made status 7 due to a low MELD score and a good quality of life. We have been unable to reactivate her as an adult because she has not passed the psychosocial evaluation. She has undergone formal  neuropsych testing. In the past, she has stated that she does not wish to undergo OLT even if she needs it. In regards to her AIH, her serologies are negative and she has been on Imuran and prednisone since her diagnosis in 2003. Regarding her PSC, she has had one bout of cholangitis years ago per report. She denies a history of stenting or dominant biliary stricture or choledolcholithiasis. She was previously on high dose ursodiol.       She presents today with her Mom. I last saw her in Feb 2015. She has been hospitalized here at [facility] for abdominal pain/worsening ascites in 11/2013 and the again at [facility] in 12/2013 for SBP despite being on Levoquin prophylaxis. Her outside records from 12/2013 were reviewed in Care Everywhere. Ascites fluid showed 7866 nuc cells with 83% polys. Blood Ctx were negative. She had 3 days of IV Rocephin with IV albumin on D1 and D3 as well as 7 days of Augmentin. She also had a 2.7L paracentesis. Stool studies were neg (C diff and immunocompromised host panel). She also had a CT scan  which was unremarkable for acute event- patent PV. She denies any worsening jaundice, pruritis, SOB, CP, increased abdominal girth, LE edema, melena, BRBPR, confusion, depression, nausea, vomiting, or diarrhea. Her abdominal pain has resolved and her ascites is well controlled on aldactone 100mg  and lasix 40mg  daily. She is following a low Na diet.      Cirrhosis Care:    1.HCC screen: MRI 09/06/13 and CT scan 12/13/13 cirrhosis, ascites, marked splenomegaly, no masses, biliary stricturing   2.Varicies surveillance: EGD 07/17/13 Grade II esophageal varices with EVL X 2. Missed follow-up.   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    --  Liver biopsy 08/03/01 minute fragments bile duct proliferation and periductular fibrosis; hepatitis G1-2, S2; ERCP 09/23/01 Subtle changes/narrowing but no dominant strictures; MRCP images 10/08 showed focal narrowing in right hepatic system; serologies ANA neg ASMA neg, antiLKMI neg AMA neg IgG normal (low level + ASMA 1:20 in 2003)    -- Occult GI bleeding with many EGD's, VCE 11/09 normal, Colonoscopy 7/06 and ?2/10 with biopsies negative for IBD including TI.    -- seasonal allergies    -- s/p tonsilectomy    -- Immune to HAV and HBV (2007)    -- EBV IgG pos CMV IgG and IgM neg       Social History:    [place], [place] with [place] sister and brother. Dorothyann GibbsZubrina is the middle child (she is adopted).   Her Mom teaches Ambulance personnglish Special Education.    No tobacco or alcohol.    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   Pets: 1 dog and 1 cat       Additional Social History:    Pain Medications: See PTA List Prescriptions: See PTA List Over the Counter: See PTA List History of alcohol / drug use?: Yes Longest period of sobriety (when/how long): UTA Name of Substance 1: THC 1 - Age of First Use: UTA 1 - Amount (size/oz): UTA 1 - Frequency: UTA 1 - Duration: UTA 1 - Last Use / Amount: Tested positive on 11/22/14                   Allergies:   Allergies  Allergen Reactions  . 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 (pt was in the hospital when this occurred - May 2016) Has patient had a PCN reaction causing immediate rash, facial/tongue/throat swelling, SOB or lightheadedness with hypotension: No Has patient had a PCN reaction causing severe rash involving mucus membranes or skin necrosis: No Has patient had a PCN reaction that required hospitalization Yes Has patient had a PCN reaction occurring within the last 10 years: Yes If all of the above answers are "NO",  then moay proceed with Ceph    Labs:  Results for orders placed or performed during the hospital encounter of 11/22/14 (from the past 48 hour(s))  CBC with Differential/Platelet     Status: Abnormal   Collection Time: 11/23/14 12:12 AM  Result Value Ref Range   WBC 4.7 4.0 - 10.5 K/uL   RBC 3.15 (L) 3.87 - 5.11 MIL/uL   Hemoglobin 10.8 (L) 12.0 - 15.0  g/dL   HCT 09.8 (L) 11.9 - 14.7 %   MCV 101.6 (H) 78.0 - 100.0 fL   MCH 34.3 (H) 26.0 - 34.0 pg   MCHC 33.8 30.0 - 36.0 g/dL   RDW 82.9 (H) 56.2 - 13.0 %   Platelets 74 (L) 150 - 400 K/uL    Comment: SPECIMEN CHECKED FOR CLOTS REPEATED TO VERIFY PLATELET COUNT CONFIRMED BY SMEAR    Neutrophils Relative % 76 %   Lymphocytes Relative 12 %   Monocytes Relative 11 %   Eosinophils Relative 1 %   Basophils Relative 0 %   Neutro Abs 3.6 1.7 - 7.7 K/uL   Lymphs Abs 0.6 (L) 0.7 - 4.0 K/uL   Monocytes Absolute 0.5 0.1 - 1.0 K/uL   Eosinophils Absolute 0.0 0.0 - 0.7 K/uL   Basophils Absolute 0.0 0.0 - 0.1 K/uL  Hepatic function panel     Status: Abnormal   Collection Time: 11/23/14 12:12 AM  Result Value Ref Range   Total Protein 6.3 (L) 6.5 - 8.1 g/dL   Albumin 3.2 (L) 3.5 - 5.0 g/dL   AST 865 (H) 15 - 41 U/L   ALT 70 (H) 14 - 54 U/L   Alkaline Phosphatase 411 (H) 38 - 126 U/L   Total Bilirubin 9.4 (H) 0.3 - 1.2 mg/dL   Bilirubin, Direct 4.6 (H) 0.1 - 0.5 mg/dL   Indirect Bilirubin 4.8 (H) 0.3 - 0.9 mg/dL  Protime-INR     Status: Abnormal   Collection Time: 11/23/14 12:12 AM  Result Value Ref Range   Prothrombin Time 16.3 (H) 11.6 - 15.2 seconds   INR 1.29 0.00 - 1.49  Urinalysis, Routine w reflex microscopic (not at [facility])     Status: Abnormal   Collection Time: 11/23/14 12:12 AM  Result Value Ref Range   Color, Urine ORANGE (A) YELLOW    Comment: BIOCHEMICALS MAY BE AFFECTED BY COLOR   APPearance CLOUDY (A) CLEAR   Specific Gravity, Urine 1.026 1.005 - 1.030   pH 6.0 5.0 - 8.0   Glucose, UA NEGATIVE NEGATIVE mg/dL   Hgb  urine dipstick MODERATE (A) NEGATIVE   Bilirubin Urine MODERATE (A) NEGATIVE   Ketones, ur 15 (A) NEGATIVE mg/dL   Protein, ur NEGATIVE NEGATIVE mg/dL   Urobilinogen, UA 0.2 0.0 - 1.0 mg/dL   Nitrite POSITIVE (A) NEGATIVE   Leukocytes, UA SMALL (A) NEGATIVE  Ammonia     Status: Abnormal   Collection Time: 11/23/14 12:12 AM  Result Value Ref Range   Ammonia 47 (H) 9 - 35 umol/L  Acetaminophen level     Status: Abnormal   Collection Time: 11/23/14 12:12 AM  Result Value Ref Range   Acetaminophen (Tylenol), Serum <10 (L) 10 - 30 ug/mL    Comment:        THERAPEUTIC CONCENTRATIONS VARY SIGNIFICANTLY. A RANGE OF 10-30 ug/mL MAY BE AN EFFECTIVE CONCENTRATION FOR MANY PATIENTS. HOWEVER, SOME ARE BEST TREATED AT CONCENTRATIONS OUTSIDE THIS RANGE. ACETAMINOPHEN CONCENTRATIONS >150 ug/mL AT 4 HOURS AFTER INGESTION AND >50 ug/mL AT 12 HOURS AFTER INGESTION ARE OFTEN ASSOCIATED WITH TOXIC REACTIONS.   Salicylate level     Status: None   Collection Time: 11/23/14 12:12 AM  Result Value Ref Range   Salicylate Lvl 6.3 2.8 - 30.0 mg/dL  Ethanol     Status: None   Collection Time: 11/23/14 12:12 AM  Result Value Ref Range   Alcohol, Ethyl (B) <5 <5 mg/dL    Comment:  LOWEST DETECTABLE LIMIT FOR SERUM ALCOHOL IS 5 mg/dL FOR MEDICAL PURPOSES ONLY   Urine microscopic-add on     Status: None   Collection Time: 11/23/14 12:12 AM  Result Value Ref Range   Squamous Epithelial / LPF RARE RARE   WBC, UA 3-6 <3 WBC/hpf   RBC / HPF 3-6 <3 RBC/hpf   Bacteria, UA RARE RARE   Urine-Other MUCOUS PRESENT   Urine rapid drug screen (hosp performed)     Status: Abnormal   Collection Time: 11/23/14 12:18 AM  Result Value Ref Range   Opiates NONE DETECTED NONE DETECTED   Cocaine NONE DETECTED NONE DETECTED   Benzodiazepines NONE DETECTED NONE DETECTED   Amphetamines NONE DETECTED NONE DETECTED   Tetrahydrocannabinol POSITIVE (A) NONE DETECTED   Barbiturates NONE DETECTED NONE DETECTED     Comment:        DRUG SCREEN FOR MEDICAL PURPOSES ONLY.  IF CONFIRMATION IS NEEDED FOR ANY PURPOSE, NOTIFY LAB WITHIN 5 DAYS.        LOWEST DETECTABLE LIMITS FOR URINE DRUG SCREEN Drug Class       Cutoff (ng/mL) Amphetamine      1000 Barbiturate      200 Benzodiazepine   200 Tricyclics       300 Opiates          300 Cocaine          300 THC              50   I-stat chem 8, ed     Status: Abnormal   Collection Time: 11/23/14 12:26 AM  Result Value Ref Range   Sodium 136 135 - 145 mmol/L   Potassium 3.6 3.5 - 5.1 mmol/L   Chloride 100 (L) 101 - 111 mmol/L   BUN 13 6 - 20 mg/dL   Creatinine, Ser 4.09 0.44 - 1.00 mg/dL   Glucose, Bld 92 65 - 99 mg/dL   Calcium, Ion 8.11 9.14 - 1.23 mmol/L   TCO2 23 0 - 100 mmol/L   Hemoglobin 11.6 (L) 12.0 - 15.0 g/dL   HCT 78.2 (L) 95.6 - 21.3 %  Pregnancy, urine     Status: None   Collection Time: 11/23/14 12:27 AM  Result Value Ref Range   Preg Test, Ur NEGATIVE NEGATIVE    Comment:        THE SENSITIVITY OF THIS METHODOLOGY IS >20 mIU/mL.   Ammonia     Status: Abnormal   Collection Time: 11/24/14  6:30 AM  Result Value Ref Range   Ammonia 50 (H) 9 - 35 umol/L    Current Facility-Administered Medications  Medication Dose Route Frequency Provider Last Rate Last Dose  . alum & mag hydroxide-simeth (MAALOX/MYLANTA) 200-200-20 MG/5ML suspension 30 mL  30 mL Oral PRN April Palumbo, MD      . azaTHIOprine [facility]) tablet 50 mg  50 mg Oral Daily Adonis Brook, NP   50 mg at 11/24/14 0915  . ibuprofen (ADVIL,MOTRIN) tablet 600 mg  600 mg Oral Q8H PRN April Palumbo, MD      . lactulose (CHRONULAC) 10 GM/15ML solution 10 g  10 g Oral BID Linwood Dibbles, MD   10 g at 11/24/14 0915  . ondansetron (ZOFRAN) tablet 4 mg  4 mg Oral Q8H PRN April Palumbo, MD      . pantoprazole (PROTONIX) EC tablet 40 mg  40 mg Oral Daily Adonis Brook, NP   40 mg at 11/24/14 0914  . predniSONE (DELTASONE) tablet 5 mg  5 mg Oral Q breakfast Adonis Brook, NP   5 mg at  11/24/14 0914  . rifaximin (XIFAXAN) tablet 550 mg  550 mg Oral BID Adonis Brook, NP   550 mg at 11/24/14 0914  . ursodiol (ACTIGALL) capsule 600 mg  600 mg Oral Daily Adonis Brook, NP   600 mg at 11/24/14 0913  . valGANciclovir (VALCYTE) 450 MG tablet TABS 900 mg  900 mg Oral BID Adonis Brook, NP   900 mg at 11/24/14 1610   Current Outpatient Prescriptions  Medication Sig Dispense Refill  . azaTHIOprine (IMURAN) 50 MG tablet Take 50 mg by mouth daily.     . furosemide (LASIX) 20 MG tablet Take 2 tablets (40 mg total) by mouth daily. 60 tablet 0  . lactulose (CHRONULAC) 10 GM/15ML solution Take 30 mLs (20 g total) by mouth 2 (two) times daily. (Patient taking differently: Take 20 g by mouth 3 (three) times daily. ) 240 mL 0  . metoCLOPramide (REGLAN) 5 MG tablet Take 5 mg by mouth 2 (two) times daily.     Marland Kitchen omeprazole (PRILOSEC) 40 MG capsule Take 40 mg by mouth daily.    Marland Kitchen oxyCODONE (ROXICODONE) 5 MG immediate release tablet Take 1 tablet (5 mg total) by mouth every 4 (four) hours as needed for severe pain. 10 tablet 0  . predniSONE (DELTASONE) 5 MG tablet Take 5 mg by mouth daily with breakfast.    . rifaximin (XIFAXAN) 550 MG TABS tablet Take 1 tablet (550 mg total) by mouth 2 (two) times daily. 60 tablet   . spironolactone (ALDACTONE) 50 MG tablet Take 150 mg by mouth daily.    Marland Kitchen sulfamethoxazole-trimethoprim (BACTRIM DS,SEPTRA DS) 800-160 MG per tablet Take 1 tablet by mouth daily.    . ursodiol (ACTIGALL) 300 MG capsule Take 600 mg by mouth daily.    . valGANciclovir (VALCYTE) 450 MG tablet Take 2 tablets (900 mg total) by mouth 2 (two) times daily. 120 tablet 0  . carvedilol (COREG) 3.125 MG tablet Take 6.25 mg by mouth daily.    . magnesium oxide (MAG-OX) 400 MG tablet Take 400 mg by mouth daily.    . ondansetron (ZOFRAN ODT) 4 MG disintegrating tablet 4mg  ODT q4 hours prn nausea/vomit (Patient taking differently: Take 4 mg by mouth every 4 (four) hours as needed for nausea or  vomiting. ) 10 tablet 0  . ondansetron (ZOFRAN) 4 MG tablet Take 1 tablet (4 mg total) by mouth every 8 (eight) hours as needed for nausea or vomiting. (Patient not taking: Reported on 11/23/2014) 20 tablet 0    Musculoskeletal: Strength & Muscle Tone: within normal limits Gait & Station: normal Patient leans: N/A  Psychiatric Specialty Exam: Review of Systems  Constitutional: Negative.   HENT: Negative.   Eyes: Negative.   Respiratory: Negative.   Cardiovascular: Negative.   Gastrointestinal: Negative.   Genitourinary: Negative.   Musculoskeletal: Negative.   Skin: Negative.   Neurological: Negative.   Endo/Heme/Allergies: Negative.   Psychiatric/Behavioral: Positive for hallucinations and substance abuse.    Blood pressure 124/70, pulse 105, temperature 98 F (36.7 C), temperature source Oral, resp. rate 18, SpO2 100 %.There is no weight on file to calculate BMI.  General Appearance: Disheveled  Eye Solicitor::  Fair  Speech:  Normal Rate  Volume:  Normal  Mood:  Anxious  Affect:  Congruent  Thought Process:  Disorganized  Orientation:  Other:  person  Thought Content:  Hallucinations: Auditory Visual  Suicidal Thoughts:  No  Homicidal Thoughts:  No  Memory:  Immediate;   Poor Recent;   Poor Remote;   Poor  Judgement:  Impaired  Insight:  Lacking  Psychomotor Activity:  Increased  Concentration:  Fair  Recall:  [employer] of Knowledge:Fair  Language: Fair  Akathisia:  No  Handed:  Right  AIMS (if indicated):     Assets:  Housing Intimacy Leisure Time Resilience Social Support  ADL's:  Intact  Cognition: WNL  Sleep:      Treatment Plan Summary: Daily contact with patient to assess and evaluate symptoms and progress in treatment, Medication management and Plan psychosis, NOS: -Crisis stabilization -Medication management: Continue medical medications -Individual and substance abuse counseling  Disposition: Recommend psychiatric Inpatient admission when  medically cleared.  Nanine Means, PMH-NP 11/24/2014 3:55 PM Patient seen face-to-face for psychiatric evaluation, chart reviewed and case discussed with the physician extender and developed treatment plan. Reviewed the information documented and agree with the treatment plan. Thedore Mins, MD

## 2014-11-24 NOTE — ED Notes (Signed)
Patient believes a child is in the cabinet by her bed. Patient states "shh, look at the cabinet." This RN looked at the cabinet. The patient states that the boy in the cabinet is moving and she can see him moving. This RN sees no movement. Patient states the child in the cabinet is playing music. No music heard in room.

## 2014-11-24 NOTE — ED Notes (Signed)
Patient upset states she has cirrhosis and her liver is hurting. States she needs her lidocaine.  Dr Clayborne DanaMesner into assess patient.  Orders received and initated.

## 2014-11-25 MED ORDER — CIPROFLOXACIN HCL 500 MG PO TABS
500.0000 mg | ORAL_TABLET | Freq: Two times a day (BID) | ORAL | Status: DC
  Administered 2014-11-25 (×2): 500 mg via ORAL
  Filled 2014-11-25 (×2): qty 1

## 2014-11-25 NOTE — Consult Note (Signed)
[place] Psychiatry Consult   Reason for Consult:  Psychosis Referring Physician:  EDP Patient Identification: Melanie Conley MRN:  [id number] Principal Diagnosis: Psychosis Diagnosis:   Patient Active Problem List   Diagnosis Date Noted  . Psychosis [F29] 11/23/2014    Priority: High  . Psychoses [F29]   . Splenomegaly [R16.1] 11/20/2014  . Ascites [R18.8]   . Pain [R52]   . Renal failure [N19]   . Encounter for palliative care [Z51.5]   . Severe sepsis with acute organ dysfunction ([place]) [A41.9, R65.20] 09/11/2014  . Acute respiratory failure with hypercapnia ([place]) [J96.02]   . Coagulopathy ([place]) [D68.9]   . Other cirrhosis of liver ([place]) [K74.69]   . Gastric varix [I86.4]   . Colitis [K52.9] 09/05/2014  . Hepatic encephalopathy ([place]) [K72.90] 09/05/2014  . Acute blood loss anemia [D62] 09/05/2014  . Irregular periods [N92.6] 08/04/2014  . 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 Cirrhosis ([place]) [K74.60]   . Pyrexia [R50.9] 06/04/2014  . Nausea and vomiting [R11.2] 06/04/2014  . 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 Hypoalbuminemia [E88.09] 06/04/2014  . Pancytopenia ([place]) [D61.818]   . Jaundice [R17]   . Sepsis ([place]) [A41.9] 05/28/2014  . Generalized abdominal pain [R10.84]   . Spontaneous bacterial peritonitis ([place]) [K65.2] 05/27/2014  . Leukopenia [D72.819] 05/27/2014  . Hypokalemia [E87.6]   . Neutropenia ([place]) [D70.9]   . SBP (spontaneous bacterial peritonitis) ([place]) [K65.2] 01/14/2014  . Autoimmune hepatitis ([place]) [K75.4]   . Protein-calorie malnutrition, severe ([place]) [E43] 01/13/2014  . Abdominal pain, acute [R10.0] 01/12/2014  . Abdominal pain [R10.9] 01/12/2014  . Anemia [D64.9]   . GI bleeding [K92.2] 07/22/2011    Total Time spent with patient: 30 minutes  Subjective:   Melanie Conley is a 24 y.o. female patient does not warrant admission.  HPI:  24 yo female who presented with hallucinations to the ED, no psychiatric  issues in the past.  She was restarted on her medical medications for cirrhosis and her psychosis cleared.  Her mother is her healthcare POA and requested she be transferred to [facility] liver transplant unit.  The unit was closed yesterday when the mother provided this information but was contacted at 26 am today.  The MD went through her labs and did not see a reason to transfer her medically as these labs appear to be her baseline, minus the ammonia which is decreasing with lactulose.  Today, the patient is clear from hallucinations and clear/coherent.  She wants to go home, encouraged her to go to [facility] to continue her liver failure care.  The patient stated she had medications and had been taking them.  Her mother later was contacted and stated she has her medications, gives them to her weekly but Randy has not met with her for the past two weeks and has not had her medications--most likely the cause of her psychosis.  Her mother has a court date on 11/8 for guardianship.  Meanwhile, Jataya is stable for discharge.  Past Psychiatric History: none   Risk to Self: Suicidal Ideation: No Suicidal Intent: No Is patient at risk for suicide?: No Suicidal Plan?: No Access to Means:  (UTA) What has been your use of drugs/alcohol within the last 12 months?: THC use How many times?:  (UTA) Other Self Harm Risks:  (UTA) Triggers for Past Attempts: Unknown Intentional Self Injurious Behavior: None Risk to Others: Homicidal Ideation: No Thoughts of Harm to Others: No Current Homicidal Intent: No Current Homicidal Plan: No Access  to Homicidal Means: No Identified Victim: n/a History of harm to others?:  (UTA) Assessment of Violence: None Noted Violent Behavior Description: No known hx of violence. Does patient have access to weapons?: No Criminal Charges Pending?: No Does patient have a court date: No Prior Inpatient Therapy: Prior Inpatient Therapy:  (UTA) Prior Therapy Dates: na Prior Therapy  Facilty/Provider(s): na Reason for Treatment: na Prior Outpatient Therapy: Prior Outpatient Therapy:  (UTA) Prior Therapy Dates: na Prior Therapy Facilty/Provider(s): na Reason for Treatment: na Does patient have an ACCT team?: No Does patient have Intensive In-House Services?  : No Does patient have [facility] services? : No Does patient have P4CC services?: No  Past Medical History:  Past Medical History  Diagnosis Date  . Anemia 2012  . 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   Marland Kitchen Autoimmune hepatitis ([place]) 2003    with overlapping primary sclerosing cholangitis  . GIB (gastrointestinal bleeding)   . Cirrhosis ([place]) 2003  . Irregular periods 08/04/2014  . Vaginal Pap smear, abnormal   . Hepatic encephalopathy ([place])   . Coagulopathy ([place])   . STD (female)     mutiple. risky sexual behavior.   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 Ascites 2003    Past Surgical History  Procedure Laterality Date  . Gastric banding port revision    . Tonsillectomy    . Esophagogastroduodenoscopy  07/22/2011    Procedure: ESOPHAGOGASTRODUODENOSCOPY (EGD);  Surgeon: Wonda Horner, MD;  Location[facility] ENDOSCOPY;  Service: Endoscopy;  Laterality: N/A;  . Esophagogastroduodenoscopy N/A 09/11/2014    Procedure: ESOPHAGOGASTRODUODENOSCOPY (EGD);  Surgeon: Wilford Corner, MD;  Location: [facility] ENDOSCOPY;  Service: Endoscopy;  Laterality: N/A;   Family History:  Family History  Problem Relation Age of Onset  . Adopted: Yes   Family Psychiatric  History: none  Social History:  History  Alcohol Use No     History  Drug Use No    Social History   Social History  . Marital Status: Single    Spouse Name: N/A  . Number of Children: N/A  . Years of Education: N/A   Social History Main Topics  . Smoking status: Former Smoker -- 0.00 packs/day for 0 years  . Smokeless tobacco: Never Used  . Alcohol Use: No  . Drug Use: No  . Sexual Activity: Yes   Other  Topics Concern  . 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       History of Present Illness: Patient is a pleasant 24 year old [place] American female who was diagnosed with liver disease at age 67 when she presented with a GI bleed. She has undergone liver biopsy and ERCP in 2003. 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 Her complications of cirrhosis include bleeding esophageal varicies, ascites, SBP and encephalopathy. She was listed for OLT at [facility] in 2006 but made status 7 due to a low MELD score and a good quality of life. We have been unable to reactivate her as an adult because she has not passed the psychosocial evaluation. She has undergone formal neuropsych testing. In the past, she has stated that she does not wish to undergo OLT even if she needs it. In regards to her AIH, her serologies are negative and she  has been on Imuran and prednisone since her diagnosis in 2003. Regarding her [place], she has had one bout of cholangitis years ago per report. She denies a history of stenting or dominant biliary stricture or choledolcholithiasis. She was previously on high dose ursodiol.       She presents today with her Mom. I last saw her in Feb 2015. She has been hospitalized here at [facility] for abdominal pain/worsening ascites in 11/2013 and the again at [facility] in 12/2013 for SBP despite being on Levoquin prophylaxis. Her outside records from 12/2013 were reviewed in [place]. Ascites fluid showed 7866 nuc cells with 83% polys. Blood Ctx were negative. She had 3 days of IV Rocephin with IV albumin on D1 and D3 as well as 7 days of Augmentin. She also had a 2.7L paracentesis. Stool studies were neg (C diff and immunocompromised host panel). She also had a CT scan which was unremarkable for acute event- patent PV. She denies any worsening jaundice, pruritis, SOB,  CP, increased abdominal girth, LE edema, melena, BRBPR, confusion, depression, nausea, vomiting, or diarrhea. Her abdominal pain has resolved and her ascites is well controlled on aldactone 159m and lasix 467mdaily. She is following a low Na diet.      Cirrhosis Care:    1.HCThomascreen: MRI 09/06/13 and CT scan 12/13/13 cirrhosis, ascites, marked splenomegaly, no masses, biliary stricturing   2.Varicies surveillance: EGD 07/17/13 Grade II esophageal varices with EVL X 2. Missed follow-up.   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    -- Liver biopsy 08/03/01 minute fragments bile duct proliferation and periductular fibrosis; hepatitis G1-2, S2; ERCP 09/23/01 Subtle changes/narrowing but no dominant strictures; MRCP images 10/08 showed focal narrowing in right hepatic system; serologies ANA neg ASMA neg, antiLKMI neg AMA neg IgG normal (low level + ASMA 1:20 in 2003)    -- Occult GI bleeding with many EGD's, VCE 11/09 normal, Colonoscopy 7/06 and ?2/10 with biopsies negative for IBD including TI.    -- seasonal allergies    -- s/p tonsilectomy    -- Immune to HAV and HBV (2007)    -- EBV IgG pos CMV IgG and IgM neg       Social History:    [place] the middle child (she is adopted).   Her Mom teaches [employer]   No tobacco or alcohol.    Nickey had been at [employer] [place] but is currently working in [employer]  Pets: 1 dog and 1 cat       Additional Social History:    Pain Medications: See PTA List Prescriptions: See PTA List Over the Counter: See PTA List History of alcohol / drug use?:  Yes Longest period of sobriety (when/how long): [place]: THC 1 - Age of First Use: UTA 1 - Amount (size/oz): UTA 1 - Frequency: UTA 1 - Duration: UTA 1 - Last Use / Amount: Tested positive on 11/22/14                   Allergies:   Allergies  Allergen Reactions  . Amoxicillin Other (  See Comments)    Damaged her spleen (pt was in the hospital when this occurred - May 2016) Has patient had a PCN reaction causing immediate rash, facial/tongue/throat swelling, SOB or lightheadedness with hypotension: No Has patient had a PCN reaction causing severe rash involving mucus membranes or skin necrosis: No Has patient had a PCN reaction that required hospitalization Yes Has patient had a PCN reaction occurring within the last 10 years: Yes If all of the above answers are "NO", then moay proceed with Ceph    Labs:  Results for orders placed or performed during the hospital encounter of 11/22/14 (from the past 48 hour(s))  Ammonia     Status: Abnormal   Collection Time: 11/24/14  6:30 AM  Result Value Ref Range   Ammonia 50 (H) 9 - 35 umol/L    Current Facility-Administered Medications  Medication Dose Route Frequency Provider Last Rate Last Dose  . alum & mag hydroxide-simeth (MAALOX/MYLANTA) 200-200-20 MG/5ML suspension 30 mL  30 mL Oral PRN April Palumbo, MD      . azaTHIOprine [facility]) tablet 25 mg  25 mg Oral Daily Patrecia Pour, NP   25 mg at 11/25/14 1012  . ciprofloxacin (CIPRO) tablet 500 mg  500 mg Oral BID Courteney Lyn Mackuen, MD   500 mg at 11/25/14 0849  . gi cocktail (Maalox,Lidocaine,Donnatal)  30 mL Oral BID PRN Patrecia Pour, NP      . ibuprofen (ADVIL,MOTRIN) tablet 600 mg  600 mg Oral Q8H PRN April Palumbo, MD      . lactulose (CHRONULAC) 10 GM/15ML solution 20 g  20 g Oral TID Patrecia Pour, NP   20 g at 11/25/14 1012  . metoCLOPramide (REGLAN) tablet 5 mg  5 mg Oral TID AC Patrecia Pour, NP   5 mg at 11/25/14 0849  . ondansetron (ZOFRAN)  tablet 4 mg  4 mg Oral Q8H PRN April Palumbo, MD      . oxyCODONE (Oxy IR/ROXICODONE) immediate release tablet 5 mg  5 mg Oral Once Merrily Pew, MD   5 mg at 11/24/14 2139  . pantoprazole (PROTONIX) EC tablet 40 mg  40 mg Oral Daily Kerrie Buffalo, NP   40 mg at 11/25/14 1014  . predniSONE (DELTASONE) tablet 5 mg  5 mg Oral Q breakfast Kerrie Buffalo, NP   5 mg at 11/25/14 0849  . rifaximin (XIFAXAN) tablet 550 mg  550 mg Oral BID Kerrie Buffalo, NP   550 mg at 11/25/14 1014  . spironolactone (ALDACTONE) tablet 150 mg  150 mg Oral Daily Patrecia Pour, NP   150 mg at 11/25/14 1012  . ursodiol (ACTIGALL) capsule 600 mg  600 mg Oral Daily Patrecia Pour, NP   600 mg at 11/25/14 1014   Current Outpatient Prescriptions  Medication Sig Dispense Refill  . azaTHIOprine (IMURAN) 50 MG tablet Take 50 mg by mouth daily.     . furosemide (LASIX) 20 MG tablet Take 2 tablets (40 mg total) by mouth daily. 60 tablet 0  . lactulose (CHRONULAC) 10 GM/15ML solution Take 30 mLs (20 g total) by mouth 2 (two) times daily. (Patient taking differently: Take 20 g by mouth 3 (three) times daily. ) 240 mL 0  . metoCLOPramide (REGLAN) 5 MG tablet Take 5 mg by mouth 2 (two) times daily.     Marland Kitchen omeprazole (PRILOSEC) 40 MG capsule Take 40 mg by mouth daily.    Marland Kitchen oxyCODONE (ROXICODONE) 5 MG immediate release tablet Take 1 tablet (  5 mg total) by mouth every 4 (four) hours as needed for severe pain. 10 tablet 0  . predniSONE (DELTASONE) 5 MG tablet Take 5 mg by mouth daily with breakfast.    . rifaximin (XIFAXAN) 550 MG TABS tablet Take 1 tablet (550 mg total) by mouth 2 (two) times daily. 60 tablet   . spironolactone (ALDACTONE) 50 MG tablet Take 150 mg by mouth daily.    Marland Kitchen sulfamethoxazole-trimethoprim (BACTRIM DS,SEPTRA DS) 800-160 MG per tablet Take 1 tablet by mouth daily.    . ursodiol (ACTIGALL) 300 MG capsule Take 600 mg by mouth daily.    . valGANciclovir (VALCYTE) 450 MG tablet Take 2 tablets (900 mg total) by mouth 2  (two) times daily. 120 tablet 0  . carvedilol (COREG) 3.125 MG tablet Take 6.25 mg by mouth daily.    . magnesium oxide (MAG-OX) 400 MG tablet Take 400 mg by mouth daily.    . ondansetron (ZOFRAN ODT) 4 MG disintegrating tablet 14m ODT q4 hours prn nausea/vomit (Patient taking differently: Take 4 mg by mouth every 4 (four) hours as needed for nausea or vomiting. ) 10 tablet 0  . ondansetron (ZOFRAN) 4 MG tablet Take 1 tablet (4 mg total) by mouth every 8 (eight) hours as needed for nausea or vomiting. (Patient not taking: Reported on 11/23/2014) 20 tablet 0    Musculoskeletal: Strength & Muscle Tone: within normal limits Gait & Station: normal Patient leans: N/A  Psychiatric Specialty Exam: ROS  Blood pressure 95/57, pulse 90, temperature 98.3 F (36.8 C), temperature source Oral, resp. rate 18, SpO2 100 %.There is no weight on file to calculate BMI.  General Appearance: Disheveled  Eye Contact::  Good  Speech:  Normal Rate  Volume:  Normal  Mood:  Euthymic  Affect:  Congruent  Thought Process:  Coherent  Orientation:  Full (Time, Place, and Person)  Thought Content:  WDL  Suicidal Thoughts:  No  Homicidal Thoughts:  No  Memory:  Immediate;   Good Recent;   Good Remote;   Good  Judgement:  Fair  Insight:  Fair  Psychomotor Activity:  Normal  Concentration:  Good  Recall:  Good  Fund of Knowledge:Good  Language: Good  Akathisia:  No  Handed:  Right  AIMS (if indicated):     Assets:  Housing Leisure Time Physical Health Resilience Social Support  ADL's:  Intact  Cognition: WNL  Sleep:      Treatment Plan Summary: Daily contact with patient to assess and evaluate symptoms and progress in treatment, Medication management and Plan Psychosis, substance induced: -Crisis stabilization -Medication management:  Home medications restarted -Counseling  -Family counseling  Disposition: No evidence of imminent risk to self or others at present.    LWaylan Boga  PSpencer10/28/2016 11:18 AM Patient seen face-to-face for psychiatric evaluation, chart reviewed and case discussed with the physician extender and developed treatment plan. Reviewed the information documented and agree with the treatment plan. MCorena Pilgrim MD

## 2014-11-25 NOTE — [facility] Suicide Risk Assessment (Signed)
Suicide Risk Assessment  Discharge Assessment   [facility] Discharge Suicide Risk Assessment   Demographic Factors:  Adolescent or young adult  Total Time spent with patient: 30 minutes  Musculoskeletal: Strength & Muscle Tone: within normal limits Gait & Station: normal Patient leans: N/A  Psychiatric Specialty Exam: ROS  Blood pressure 95/57, pulse 90, temperature 98.3 F (36.8 C), temperature source Oral, resp. rate 18, SpO2 100 %.There is no weight on file to calculate BMI.  General Appearance: Disheveled  Eye Contact:: Good  Speech: Normal Rate  Volume: Normal  Mood: Euthymic  Affect: Congruent  Thought Process: Coherent  Orientation: Full (Time, Place, and Person)  Thought Content: WDL  Suicidal Thoughts: No  Homicidal Thoughts: No  Memory: Immediate; Good Recent; Good Remote; Good  Judgement: Fair  Insight: Fair  Psychomotor Activity: Normal  Concentration: Good  Recall: Good  Fund of Knowledge:Good  Language: Good  Akathisia: No  Handed: Right  AIMS (if indicated):    Assets: Housing Leisure Time Physical Health Resilience Social Support  ADL's: Intact  Cognition: WNL  Sleep:             Has this patient used any form of tobacco in the last 30 days? (Cigarettes, Smokeless Tobacco, Cigars, and/or Pipes) No  Mental Status Per Nursing Assessment::   On Admission:   psychosis  Current Mental Status by Physician: NA  Loss Factors: NA  Historical Factors: NA  Risk Reduction Factors:   Sense of responsibility to family, Living with another person, especially a relative and Positive social support  Continued Clinical Symptoms:  None  Cognitive Features That Contribute To Risk:  None    Suicide Risk:  Minimal: No identifiable suicidal ideation.  Patients presenting with no risk factors but with morbid ruminations; may be classified as minimal risk based on the severity of the depressive  symptoms  Principal Problem: Psychosis Discharge Diagnoses:  Patient Active Problem List   Diagnosis Date Noted  . Psychosis [F29] 11/23/2014    Priority: High  . Psychoses [F29]   . Splenomegaly [R16.1] 11/20/2014  . Ascites [R18.8]   . Pain [R52]   . Renal failure [N19]   . Encounter for palliative care [Z51.5]   . Severe sepsis with acute organ dysfunction (HCC) [A41.9, R65.20] 09/11/2014  . Acute respiratory failure with hypercapnia (HCC) [J96.02]   . Coagulopathy (HCC) [D68.9]   . Other cirrhosis of liver (HCC) [K74.69]   . Gastric varix [I86.4]   . Colitis [K52.9] 09/05/2014  . Hepatic encephalopathy (HCC) [K72.90] 09/05/2014  . Acute blood loss anemia [D62] 09/05/2014  . Irregular periods [N92.6] 08/04/2014  . 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 Cirrhosis (HCC) [K74.60]   . Pyrexia [R50.9] 06/04/2014  . Nausea and vomiting [R11.2] 06/04/2014  . 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 Hypoalbuminemia [E88.09] 06/04/2014  . Pancytopenia (HCC) [D61.818]   . Jaundice [R17]   . Sepsis (HCC) [A41.9] 05/28/2014  . Generalized abdominal pain [R10.84]   . Spontaneous bacterial peritonitis (HCC) [K65.2] 05/27/2014  . Leukopenia [D72.819] 05/27/2014  . Hypokalemia [E87.6]   . Neutropenia (HCC) [D70.9]   . SBP (spontaneous bacterial peritonitis) (HCC) [K65.2] 01/14/2014  . 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 Protein-calorie malnutrition, severe (HCC) [E43] 01/13/2014  . Abdominal pain, acute [R10.0] 01/12/2014  . Abdominal pain [R10.9] 01/12/2014  . Anemia [D64.9]   . GI bleeding [K92.2] 07/22/2011      Plan Of Care/Follow-up recommendations:  Activity:  as tolerated Diet:  heart healthy diet  Is patient on multiple antipsychotic therapies at discharge:  No   Has Patient had three or more failed trials of antipsychotic monotherapy by history:  No  Recommended Plan for Multiple Antipsychotic Therapies: NA    Careli Luzader,  PMH-NP 11/25/2014, 3:50 PM

## 2014-11-25 NOTE — ED Notes (Signed)
Patient is A&Ox4, denies SI/HI and A/V hallucinations, boyfriend at bedside. Patient is being discharged home with boyfriend, all belongings returned to patient.   Hansini Clodfelter, Wyman SongsterAngela Marie, RN

## 2014-11-25 NOTE — ED Notes (Signed)
Patient is A&Ox4, denies SI/HI and A/V hallucinations. Patient states she is ready to go home and would like to see the Child psychotherapistsocial worker. Patient also spoke about wanting to eat but we don't have anything she likes. Patient verbalized understanding to find staff if she feels she is no longer safe.  Arlyn Bumpus, Wyman SongsterAngela Marie, RN

## 2014-12-05 ENCOUNTER — Other Ambulatory Visit (HOSPITAL_COMMUNITY): Payer: Self-pay | Admitting: Internal Medicine

## 2014-12-05 DIAGNOSIS — R188 Other ascites: Secondary | ICD-10-CM

## 2014-12-06 ENCOUNTER — Ambulatory Visit (HOSPITAL_COMMUNITY)
Admission: RE | Admit: 2014-12-06 | Discharge: 2014-12-06 | Disposition: A | Discharge status: Home / Self Care | Payer: BC Managed Care – PPO | Source: Ambulatory Visit | Attending: Internal Medicine | Admitting: Internal Medicine

## 2014-12-06 DIAGNOSIS — R188 Other ascites: Secondary | ICD-10-CM | POA: Insufficient documentation

## 2014-12-06 DIAGNOSIS — K7460 Unspecified cirrhosis of liver: Secondary | ICD-10-CM | POA: Insufficient documentation

## 2014-12-06 LAB — GRAM STAIN

## 2014-12-06 MED ORDER — ALBUMIN HUMAN 25 % IV SOLN
25.0000 g | Freq: Once | INTRAVENOUS | Status: AC
  Administered 2014-12-06: 25 g via INTRAVENOUS
  Filled 2014-12-06: qty 100

## 2014-12-06 MED ORDER — LIDOCAINE HCL (PF) 1 % IJ SOLN
INTRAMUSCULAR | Status: AC
  Filled 2014-12-06: qty 10

## 2014-12-06 NOTE — Procedures (Signed)
Successful US guided paracentesis from LLQ.  Yielded 4.6 liters of serous fluid.  No immediate complications.  Pt tolerated well.  Post procedure IV Albumin ordered.   Specimen was sent for labs.  Pattricia BossMORGAN, Jaleiyah Alas D PA-C 12/06/2014 11:14 AM

## 2014-12-11 LAB — CULTURE, BODY FLUID-BOTTLE: CULTURE: NO GROWTH

## 2014-12-11 LAB — CULTURE, BODY FLUID W GRAM STAIN -BOTTLE

## 2014-12-23 ENCOUNTER — Encounter (HOSPITAL_COMMUNITY): Payer: Self-pay | Admitting: *Deleted

## 2014-12-23 ENCOUNTER — Emergency Department (HOSPITAL_COMMUNITY)
Admission: EM | Admit: 2014-12-23 | Discharge: 2014-12-23 | Disposition: A | Discharge status: Home / Self Care | Payer: BC Managed Care – PPO | Attending: Emergency Medicine | Admitting: Emergency Medicine

## 2014-12-23 ENCOUNTER — Emergency Department (HOSPITAL_COMMUNITY): Payer: BC Managed Care – PPO

## 2014-12-23 DIAGNOSIS — Z8679 Personal history of other diseases of the circulatory system: Secondary | ICD-10-CM | POA: Diagnosis not present

## 2014-12-23 DIAGNOSIS — Z8619 Personal history of other infectious and parasitic diseases: Secondary | ICD-10-CM | POA: Diagnosis not present

## 2014-12-23 DIAGNOSIS — Z8719 Personal history of other diseases of the digestive system: Secondary | ICD-10-CM | POA: Insufficient documentation

## 2014-12-23 DIAGNOSIS — Z79899 Other long term (current) drug therapy: Secondary | ICD-10-CM | POA: Insufficient documentation

## 2014-12-23 DIAGNOSIS — Z87891 Personal history of nicotine dependence: Secondary | ICD-10-CM | POA: Diagnosis not present

## 2014-12-23 DIAGNOSIS — Z7952 Long term (current) use of systemic steroids: Secondary | ICD-10-CM | POA: Diagnosis not present

## 2014-12-23 DIAGNOSIS — Z8742 Personal history of other diseases of the female genital tract: Secondary | ICD-10-CM | POA: Diagnosis not present

## 2014-12-23 DIAGNOSIS — R000 Tachycardia, unspecified: Secondary | ICD-10-CM | POA: Insufficient documentation

## 2014-12-23 DIAGNOSIS — Z880 Allergy status to penicillin: Secondary | ICD-10-CM | POA: Insufficient documentation

## 2014-12-23 DIAGNOSIS — Z862 Personal history of diseases of the blood and blood-forming organs and certain disorders involving the immune mechanism: Secondary | ICD-10-CM | POA: Insufficient documentation

## 2014-12-23 DIAGNOSIS — R1013 Epigastric pain: Secondary | ICD-10-CM | POA: Diagnosis present

## 2014-12-23 DIAGNOSIS — N390 Urinary tract infection, site not specified: Secondary | ICD-10-CM | POA: Diagnosis not present

## 2014-12-23 DIAGNOSIS — Z3202 Encounter for pregnancy test, result negative: Secondary | ICD-10-CM | POA: Diagnosis not present

## 2014-12-23 DIAGNOSIS — Z9884 Bariatric surgery status: Secondary | ICD-10-CM | POA: Diagnosis not present

## 2014-12-23 LAB — CBC
HEMATOCRIT: 30.9 % — AB (ref 36.0–46.0)
HEMOGLOBIN: 10.8 g/dL — AB (ref 12.0–15.0)
MCH: 33.8 pg (ref 26.0–34.0)
MCHC: 35 g/dL (ref 30.0–36.0)
MCV: 96.6 fL (ref 78.0–100.0)
Platelets: 77 10*3/uL — ABNORMAL LOW (ref 150–400)
RBC: 3.2 MIL/uL — AB (ref 3.87–5.11)
RDW: 13.9 % (ref 11.5–15.5)
WBC: 5.8 10*3/uL (ref 4.0–10.5)

## 2014-12-23 LAB — COMPREHENSIVE METABOLIC PANEL
ALBUMIN: 2.7 g/dL — AB (ref 3.5–5.0)
ALT: 60 U/L — ABNORMAL HIGH (ref 14–54)
ANION GAP: 9 (ref 5–15)
AST: 96 U/L — ABNORMAL HIGH (ref 15–41)
Alkaline Phosphatase: 447 U/L — ABNORMAL HIGH (ref 38–126)
BILIRUBIN TOTAL: 6.9 mg/dL — AB (ref 0.3–1.2)
BUN: 8 mg/dL (ref 6–20)
CALCIUM: 8.2 mg/dL — AB (ref 8.9–10.3)
CO2: 23 mmol/L (ref 22–32)
Chloride: 89 mmol/L — ABNORMAL LOW (ref 101–111)
Creatinine, Ser: 0.69 mg/dL (ref 0.44–1.00)
GLUCOSE: 109 mg/dL — AB (ref 65–99)
POTASSIUM: 3.1 mmol/L — AB (ref 3.5–5.1)
Sodium: 121 mmol/L — ABNORMAL LOW (ref 135–145)
TOTAL PROTEIN: 6.1 g/dL — AB (ref 6.5–8.1)

## 2014-12-23 LAB — I-STAT BETA HCG BLOOD, ED (MC, WL, AP ONLY)

## 2014-12-23 LAB — LIPASE, BLOOD: Lipase: 43 U/L (ref 11–51)

## 2014-12-23 LAB — I-STAT CHEM 8, ED
BUN: 8 mg/dL (ref 6–20)
CHLORIDE: 91 mmol/L — AB (ref 101–111)
CREATININE: 0.8 mg/dL (ref 0.44–1.00)
Calcium, Ion: 1.1 mmol/L — ABNORMAL LOW (ref 1.12–1.23)
GLUCOSE: 150 mg/dL — AB (ref 65–99)
HCT: 34 % — ABNORMAL LOW (ref 36.0–46.0)
HEMOGLOBIN: 11.6 g/dL — AB (ref 12.0–15.0)
POTASSIUM: 3.4 mmol/L — AB (ref 3.5–5.1)
Sodium: 128 mmol/L — ABNORMAL LOW (ref 135–145)
TCO2: 25 mmol/L (ref 0–100)

## 2014-12-23 LAB — PROTIME-INR
INR: 1.49 (ref 0.00–1.49)
PROTHROMBIN TIME: 18.1 s — AB (ref 11.6–15.2)

## 2014-12-23 LAB — URINALYSIS, ROUTINE W REFLEX MICROSCOPIC
Glucose, UA: NEGATIVE mg/dL
Hgb urine dipstick: NEGATIVE
Ketones, ur: 15 mg/dL — AB
NITRITE: POSITIVE — AB
PH: 6.5 (ref 5.0–8.0)
Protein, ur: NEGATIVE mg/dL
SPECIFIC GRAVITY, URINE: 1.018 (ref 1.005–1.030)

## 2014-12-23 LAB — URINE MICROSCOPIC-ADD ON

## 2014-12-23 LAB — APTT: aPTT: 33 seconds (ref 24–37)

## 2014-12-23 MED ORDER — NITROFURANTOIN MONOHYD MACRO 100 MG PO CAPS: 100.0000 mg | ORAL_CAPSULE | Freq: Two times a day (BID) | ORAL | Status: DC

## 2014-12-23 MED ORDER — ONDANSETRON HCL 4 MG/2ML IJ SOLN
4.0000 mg | Freq: Once | INTRAMUSCULAR | Status: AC
  Administered 2014-12-23: 4 mg via INTRAVENOUS
  Filled 2014-12-23: qty 2

## 2014-12-23 MED ORDER — IOHEXOL 300 MG/ML  SOLN
100.0000 mL | Freq: Once | INTRAMUSCULAR | Status: AC | PRN
  Administered 2014-12-23: 100 mL via INTRAVENOUS

## 2014-12-23 MED ORDER — FENTANYL CITRATE (PF) 100 MCG/2ML IJ SOLN
100.0000 ug | Freq: Once | INTRAMUSCULAR | Status: AC
  Administered 2014-12-23: 100 ug via INTRAVENOUS
  Filled 2014-12-23: qty 2

## 2014-12-23 MED ORDER — LIDOCAINE VISCOUS 2 % MT SOLN
15.0000 mL | Freq: Once | OROMUCOSAL | Status: AC
  Administered 2014-12-23: 15 mL via OROMUCOSAL
  Filled 2014-12-23: qty 15

## 2014-12-23 MED ORDER — ONDANSETRON 4 MG PO TBDP: 4.0000 mg | ORAL_TABLET | Freq: Three times a day (TID) | ORAL | Status: DC | PRN

## 2014-12-23 MED ORDER — SODIUM CHLORIDE 0.9 % IV BOLUS (SEPSIS)
1000.0000 mL | Freq: Once | INTRAVENOUS | Status: AC
  Administered 2014-12-23: 1000 mL via INTRAVENOUS

## 2014-12-23 MED ORDER — CIPROFLOXACIN IN D5W 400 MG/200ML IV SOLN
400.0000 mg | Freq: Once | INTRAVENOUS | Status: AC
  Administered 2014-12-23: 400 mg via INTRAVENOUS
  Filled 2014-12-23: qty 200

## 2014-12-23 MED ORDER — HYDROMORPHONE HCL 1 MG/ML IJ SOLN
1.0000 mg | Freq: Once | INTRAMUSCULAR | Status: AC
  Administered 2014-12-23: 1 mg via INTRAVENOUS
  Filled 2014-12-23: qty 1

## 2014-12-23 MED ORDER — ALUM & MAG HYDROXIDE-SIMETH 200-200-20 MG/5ML PO SUSP
15.0000 mL | Freq: Once | ORAL | Status: AC
  Administered 2014-12-23: 15 mL via ORAL
  Filled 2014-12-23: qty 30

## 2014-12-23 MED ORDER — CEFTRIAXONE SODIUM 1 G IJ SOLR
1.0000 g | INTRAMUSCULAR | Status: DC
Start: 2014-12-23 — End: 2014-12-23

## 2014-12-23 NOTE — ED Notes (Signed)
Patient to CT.

## 2014-12-23 NOTE — ED Notes (Signed)
Pt. Is aware of needing an urine specimen 

## 2014-12-23 NOTE — ED Notes (Signed)
MD at bedside. 

## 2014-12-23 NOTE — ED Provider Notes (Signed)
I saw and evaluated the patient, reviewed the resident's note and I agree with the findings and plan.  24 year old female with abdominal pain and epigastric area similar to previous episodes of her chronic abdominal pain. Labs okay aside from UTI which is treated with 1 ttime dose of fosfomycin.QTC slightly prolonged , however has a history of the same. No QT prolonging medications given here and see any obvious wounds to change from her med list.    EKG Interpretation   Date/Time:  Friday December 23 2014 16:47:05 EST Ventricular Rate:  102 PR Interval:  131 QRS Duration: 83 QT Interval:  397 QTC Calculation: 517 R Axis:   10 Text Interpretation:  Sinus tachycardia Prolonged QT interval Confirmed by  [facility] MD, Barbara CowerJASON [phone] on 12/23/2014 4:50:30 PM        Marily MemosJason Kaedence Connelly, MD 12/24/14 [id number]

## 2014-12-23 NOTE — ED Provider Notes (Signed)
CSN: [id number]     Arrival date & time 12/23/14  1636 History   First MD Initiated Contact with Patient 12/23/14 1654     Chief Complaint  Patient presents with  . Abdominal Pain   Patient is a 24 y.o. female presenting with abdominal pain. The history is provided by the patient.  Abdominal Pain Pain location:  Epigastric Pain quality: burning and sharp   Pain radiates to:  Does not radiate Pain severity:  Severe Onset quality:  Gradual Duration:  1 day Timing:  Constant Chronicity:  New Worsened by:  Nothing tried Associated symptoms: nausea   Associated symptoms: no chest pain, no constipation, no diarrhea, no dysuria, no fever, no hematuria, no melena, no shortness of breath, no sore throat, no vaginal bleeding, no vaginal discharge and no vomiting     Past Medical History  Diagnosis Date  . Anemia 2012  . Esophageal varices (HCC) 2015    s/p multiple EGDs and bandings dating to 2015.   Marland Kitchen. 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 GIB (gastrointestinal bleeding)   . Cirrhosis (HCC) 2003  . Irregular periods 08/04/2014  . Vaginal Pap smear, abnormal   . Hepatic encephalopathy (HCC)   . Coagulopathy (HCC)   . STD (female)     mutiple. risky sexual behavior.   Marland Kitchen. 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 Ascites 2003   Past Surgical History  Procedure Laterality Date  . Gastric banding port revision    . Tonsillectomy    . Esophagogastroduodenoscopy  07/22/2011    Procedure: ESOPHAGOGASTRODUODENOSCOPY (EGD);  Surgeon: Graylin ShiverSalem F Ganem, MD;  [facility];  Service: Endoscopy;  Laterality: N/A;  . Esophagogastroduodenoscopy N/A 09/11/2014    Procedure: ESOPHAGOGASTRODUODENOSCOPY (EGD);  Surgeon: Charlott RakesVincent Schooler, MD;  [facility];  Service: Endoscopy;  Laterality: N/A;   Family History  Problem Relation Age of Onset  . Adopted: Yes   Social History  Substance Use Topics  . Smoking status: Former Smoker --  0.00 packs/day for 0 years  . Smokeless tobacco: Never Used  . Alcohol Use: No   OB History    Gravida Para Term Preterm AB TAB SAB Ectopic Multiple Living   0 0 0 0 0 0 0 0 0 0      Review of Systems  Constitutional: Negative for fever.  HENT: Negative for rhinorrhea and sore throat.   Eyes: Negative for visual disturbance.  Respiratory: Negative for chest tightness and shortness of breath.   Cardiovascular: Negative for chest pain and palpitations.  Gastrointestinal: Positive for nausea and abdominal pain. Negative for vomiting, diarrhea, constipation and melena.  Genitourinary: Negative for dysuria, hematuria, vaginal bleeding and vaginal discharge.  Musculoskeletal: Negative for back pain and neck pain.  Skin: Negative for rash.  Neurological: Negative for dizziness and headaches.  Psychiatric/Behavioral: Negative for confusion.  All other systems reviewed and are negative.     Allergies  Nsaids; Tylenol; and Amoxicillin  Home Medications   Prior to Admission medications   Medication Sig Start Date End Date Taking? Authorizing Provider  azaTHIOprine (IMURAN) 50 MG tablet Take 50 mg by mouth daily.    Yes Historical Provider, MD  carvedilol (COREG) 3.125 MG tablet Take 3.125 mg by mouth 2 (two) times daily with a meal.    Yes Historical Provider, MD  furosemide (LASIX) 20 MG tablet Take 2 tablets (40 mg total) by mouth daily. Patient taking differently: Take 20 mg by mouth 2 (two)  times daily.  10/12/14  Yes Shanker Levora Dredge, MD  lactulose (CHRONULAC) 10 GM/15ML solution Take 30 mLs (20 g total) by mouth 2 (two) times daily. Patient taking differently: Take 20 g by mouth 3 (three) times daily.  09/13/14  Yes Vilinda Blanks Minor, NP  magnesium oxide (MAG-OX) 400 MG tablet Take 400 mg by mouth daily.   Yes Historical Provider, MD  metoCLOPramide (REGLAN) 5 MG tablet Take 5 mg by mouth 2 (two) times daily.    Yes Historical Provider, MD  omeprazole (PRILOSEC) 40 MG capsule Take 40  mg by mouth daily.   Yes Historical Provider, MD  predniSONE (DELTASONE) 5 MG tablet Take 5 mg by mouth daily with breakfast.   Yes Historical Provider, MD  spironolactone (ALDACTONE) 50 MG tablet Take 150 mg by mouth daily.   Yes Historical Provider, MD  ursodiol (ACTIGALL) 300 MG capsule Take 300 mg by mouth 2 (two) times daily.    Yes Historical Provider, MD  valGANciclovir (VALCYTE) 450 MG tablet Take 2 tablets (900 mg total) by mouth 2 (two) times daily. 10/12/14  Yes Shanker Levora Dredge, MD  nitrofurantoin, macrocrystal-monohydrate, (MACROBID) 100 MG capsule Take 1 capsule (100 mg total) by mouth 2 (two) times daily. 12/23/14   Maris Berger, MD  ondansetron (ZOFRAN ODT) 4 MG disintegrating tablet  ODT q4 hours prn nausea/vomit Patient not taking: Reported on 12/23/2014 11/17/14   Loren Racer, MD  ondansetron (ZOFRAN ODT) 4 MG disintegrating tablet Take 1 tablet (4 mg total) by mouth every 8 (eight) hours as needed for nausea or vomiting. 12/23/14   Maris Berger, MD  ondansetron (ZOFRAN) 4 MG tablet Take 1 tablet (4 mg total) by mouth every 8 (eight) hours as needed for nausea or vomiting. Patient not taking: Reported on 11/23/2014 10/12/14   Maretta Bees, MD  oxyCODONE (ROXICODONE) 5 MG immediate release tablet Take 1 tablet (5 mg total) by mouth every 4 (four) hours as needed for severe pain. Patient not taking: Reported on 12/23/2014 11/17/14   Loren Racer, MD  rifaximin (XIFAXAN) 550 MG TABS tablet Take 1 tablet (550 mg total) by mouth 2 (two) times daily. Patient not taking: Reported on 12/23/2014 09/13/14   Vilinda Blanks Minor, NP   BP 117/83 mmHg  Pulse 110  Temp(Src) 98.9 F (37.2 C) (Oral)  Resp 16  SpO2 96% Physical Exam  Constitutional: She is oriented to person, place, and time. She appears well-developed and well-nourished. No distress.  HENT:  Head: Normocephalic and atraumatic.  Mouth/Throat: Oropharynx is clear and moist.  Eyes: EOM are normal. Pupils are  equal, round, and reactive to light.  Neck: Neck supple. No JVD present.  Cardiovascular: Regular rhythm, normal heart sounds and intact distal pulses.  Tachycardia present.  Exam reveals no gallop.   No murmur heard. Pulmonary/Chest: Effort normal and breath sounds normal. She has no wheezes. She has no rales.  Abdominal: Soft. She exhibits no distension. There is tenderness in the epigastric area. There is no rigidity, no rebound and no guarding.  Musculoskeletal: Normal range of motion. She exhibits no tenderness.  Neurological: She is alert and oriented to person, place, and time. No cranial nerve deficit. She exhibits normal muscle tone.  Skin: Skin is warm and dry. No rash noted.  Psychiatric: Her behavior is normal.    ED Course  Procedures  None   Labs Review Labs Reviewed  COMPREHENSIVE METABOLIC PANEL - Abnormal; Notable for the following:    Sodium 121 (*)  Potassium 3.1 (*)    Chloride 89 (*)    Glucose, Bld 109 (*)    Calcium 8.2 (*)    Total Protein 6.1 (*)    Albumin 2.7 (*)    AST 96 (*)    ALT 60 (*)    Alkaline Phosphatase 447 (*)    Total Bilirubin 6.9 (*)    All other components within normal limits  CBC - Abnormal; Notable for the following:    RBC 3.20 (*)    Hemoglobin 10.8 (*)    HCT 30.9 (*)    Platelets 77 (*)    All other components within normal limits  URINALYSIS, ROUTINE W REFLEX MICROSCOPIC (NOT AT [facility]) - Abnormal; Notable for the following:    Color, Urine ORANGE (*)    APPearance CLOUDY (*)    Bilirubin Urine MODERATE (*)    Ketones, ur 15 (*)    Nitrite POSITIVE (*)    Leukocytes, UA MODERATE (*)    All other components within normal limits  PROTIME-INR - Abnormal; Notable for the following:    Prothrombin Time 18.1 (*)    All other components within normal limits  URINE MICROSCOPIC-ADD ON - Abnormal; Notable for the following:    Squamous Epithelial / LPF 6-30 (*)    Bacteria, UA MANY (*)    All other components within normal  limits  I-STAT CHEM 8, ED - Abnormal; Notable for the following:    Sodium 128 (*)    Potassium 3.4 (*)    Chloride 91 (*)    Glucose, Bld 150 (*)    Calcium, Ion 1.10 (*)    Hemoglobin 11.6 (*)    HCT 34.0 (*)    All other components within normal limits  LIPASE, BLOOD  APTT  I-STAT BETA HCG BLOOD, ED ([facility], [facility], [facility] ONLY)    Imaging Review Ct Abdomen Pelvis W Contrast  12/23/2014  CLINICAL DATA:  Chronic liver and spleen disease. Ascites and multiple STD ease. Nausea and vomiting, diffuse abdominal pain, and tenderness with palpation. EXAM: CT ABDOMEN AND PELVIS WITH CONTRAST TECHNIQUE: Multidetector CT imaging of the abdomen and pelvis was performed using the standard protocol following bolus administration of intravenous contrast. CONTRAST:  OMNIPAQUE IOHEXOL 300 MG/ML  SOLN COMPARISON:  Ultrasound abdomen 12/06/2014. CT abdomen and pelvis 11/20/2014. FINDINGS: Lung bases are clear. Changes of hepatic cirrhosis with diffuse hepatic atrophy, enlargement of the caudate lobe, and nodular contour. Spleen is diffusely enlarged with multiple peripheral wedge-shaped infarcts. Flow is demonstrated in the portal veins and mesenteric veins with evidence of portal venous hypertension including portal since in make shunting and prominent varices throughout the upper abdomen, including periesophageal, gastrohepatic ligament, splenic vein, and left gonadal vein varices. Recanalized paraumbilical veins. Large amount of abdominal and pelvic ascites. Gallbladder is mildly distended without obvious wall thickening or stone. No bile duct dilatation. Small bowel demonstrates diffuse wall and fold thickening and hyperemia likely due indicate hypoproteinemia and ascites. Diffuse wall thickening and edema of the colon which may indicate colitis. Small umbilical hernia containing fat and fluid. Overall, appearances are similar to previous study. The kidneys, adrenal glands, abdominal aorta, inferior vena cava, and  retroperitoneal lymph nodes are unremarkable. No free air in the abdomen. Pelvis: Large amount of pelvic ascites. Bladder wall is not thickened. Appendix is not identified. Uterus and ovaries are not enlarged. No pelvic mass or lymphadenopathy. No destructive bone lesions. IMPRESSION: Changes of hepatic cirrhosis and portal venous hypertension with splenic enlargement, multiple splenic infarcts,  diffuse upper abdominal varices, recanalization of paraumbilical veins, and prominent abdominal and pelvic ascites. Wall/ fold thickening and hyperemia of small bowel consistent with hypoproteinemia or effective ascites. Colonic wall thickening may indicate colitis. Small umbilical hernia containing fat and fluid. Appearances are similar to previous study. Electronically Signed   By: Burman Nieves M.D.   On: 12/23/2014 20:34   I have personally reviewed and evaluated these images and lab results as part of my medical decision-making.   EKG Interpretation  Date/Time:  Friday December 23 2014 16:47:05 EST Ventricular Rate:  102 PR Interval:  131 QRS Duration: 83 QT Interval:  397 QTC Calculation: 517 R Axis:   10 Text Interpretation:  Sinus tachycardia Prolonged QT interval Confirmed by  [facility] MD, Barbara Cower [phone]) on 12/23/2014 4:50:30 PM    MDM   Final diagnoses:  UTI (lower urinary tract infection)   24 yo F with a PMH of autoimmune hepatitis, primary sclerosing cholangitis, cirrhosis and splenomegaly who presents with epigastric pain x 1 day with nausea. No vomiting, fever, diarrhea or black or bloody stools. AF, mild tachycardia, normotensive. Moderate epigastric TTP. Negative murphy's sign. No lower abd pain or GU symptoms. UA concerning for UTI. Gave cirpo and IVFs. GI cocktail given with improvement of symptoms. Will d/c home with macrobid. Pt also noted to have prolonged QTc. No ischemic changes or arrhythmias on EKG. Labs with hyponatremia that is improving with IVFs. Able to tolerate PO at  discharge. Pain improving. Stable to go home at this time.  Discussed with Dr. Clayborne Dana.   Maris Berger, MD 12/24/14 [id number]  Marily Memos, MD 12/24/14 [phone]

## 2014-12-23 NOTE — ED Notes (Signed)
Patient left at this time with all belongings. 

## 2014-12-23 NOTE — ED Notes (Addendum)
Pt reports abdominal pain, N/V/D since yesterday. Tenderness to palpation in abdomen. Pt reports hx of ascites and liver failure. Pt received of fentanyl en route

## 2014-12-26 ENCOUNTER — Other Ambulatory Visit (HOSPITAL_COMMUNITY): Payer: Self-pay | Admitting: Internal Medicine

## 2014-12-26 DIAGNOSIS — K8301 Primary sclerosing cholangitis: Secondary | ICD-10-CM

## 2014-12-26 DIAGNOSIS — R188 Other ascites: Secondary | ICD-10-CM

## 2014-12-26 DIAGNOSIS — K754 Autoimmune hepatitis: Secondary | ICD-10-CM

## 2014-12-28 ENCOUNTER — Emergency Department (HOSPITAL_COMMUNITY)
Admission: EM | Admit: 2014-12-28 | Discharge: 2014-12-28 | Disposition: A | Discharge status: Home / Self Care | Payer: BC Managed Care – PPO | Attending: Emergency Medicine | Admitting: Emergency Medicine

## 2014-12-28 ENCOUNTER — Other Ambulatory Visit: Payer: Self-pay | Admitting: Radiology

## 2014-12-28 ENCOUNTER — Encounter (HOSPITAL_COMMUNITY): Payer: Self-pay | Admitting: Emergency Medicine

## 2014-12-28 DIAGNOSIS — Z8659 Personal history of other mental and behavioral disorders: Secondary | ICD-10-CM | POA: Diagnosis not present

## 2014-12-28 DIAGNOSIS — Z87891 Personal history of nicotine dependence: Secondary | ICD-10-CM | POA: Diagnosis not present

## 2014-12-28 DIAGNOSIS — Z862 Personal history of diseases of the blood and blood-forming organs and certain disorders involving the immune mechanism: Secondary | ICD-10-CM | POA: Insufficient documentation

## 2014-12-28 DIAGNOSIS — Z880 Allergy status to penicillin: Secondary | ICD-10-CM | POA: Insufficient documentation

## 2014-12-28 DIAGNOSIS — R1013 Epigastric pain: Secondary | ICD-10-CM | POA: Insufficient documentation

## 2014-12-28 DIAGNOSIS — R1010 Upper abdominal pain, unspecified: Secondary | ICD-10-CM

## 2014-12-28 DIAGNOSIS — Z3202 Encounter for pregnancy test, result negative: Secondary | ICD-10-CM | POA: Diagnosis not present

## 2014-12-28 DIAGNOSIS — R1030 Lower abdominal pain, unspecified: Secondary | ICD-10-CM | POA: Diagnosis not present

## 2014-12-28 DIAGNOSIS — Z8619 Personal history of other infectious and parasitic diseases: Secondary | ICD-10-CM | POA: Diagnosis not present

## 2014-12-28 DIAGNOSIS — Z79899 Other long term (current) drug therapy: Secondary | ICD-10-CM | POA: Insufficient documentation

## 2014-12-28 LAB — URINALYSIS, ROUTINE W REFLEX MICROSCOPIC
Bilirubin Urine: NEGATIVE
Glucose, UA: NEGATIVE mg/dL
Hgb urine dipstick: NEGATIVE
KETONES UR: NEGATIVE mg/dL
LEUKOCYTES UA: NEGATIVE
NITRITE: NEGATIVE
PH: 7 (ref 5.0–8.0)
Protein, ur: NEGATIVE mg/dL
Specific Gravity, Urine: 1.01 (ref 1.005–1.030)

## 2014-12-28 LAB — COMPREHENSIVE METABOLIC PANEL
ALT: 68 U/L — ABNORMAL HIGH (ref 14–54)
ANION GAP: 6 (ref 5–15)
AST: 81 U/L — ABNORMAL HIGH (ref 15–41)
Albumin: 2.5 g/dL — ABNORMAL LOW (ref 3.5–5.0)
Alkaline Phosphatase: 573 U/L — ABNORMAL HIGH (ref 38–126)
BILIRUBIN TOTAL: 6.7 mg/dL — AB (ref 0.3–1.2)
BUN: 9 mg/dL (ref 6–20)
CO2: 23 mmol/L (ref 22–32)
Calcium: 8.3 mg/dL — ABNORMAL LOW (ref 8.9–10.3)
Chloride: 95 mmol/L — ABNORMAL LOW (ref 101–111)
Creatinine, Ser: 0.74 mg/dL (ref 0.44–1.00)
Glucose, Bld: 113 mg/dL — ABNORMAL HIGH (ref 65–99)
POTASSIUM: 4.1 mmol/L (ref 3.5–5.1)
Sodium: 124 mmol/L — ABNORMAL LOW (ref 135–145)
TOTAL PROTEIN: 5.8 g/dL — AB (ref 6.5–8.1)

## 2014-12-28 LAB — CBC
HEMATOCRIT: 31.2 % — AB (ref 36.0–46.0)
Hemoglobin: 10.8 g/dL — ABNORMAL LOW (ref 12.0–15.0)
MCH: 33.6 pg (ref 26.0–34.0)
MCHC: 34.6 g/dL (ref 30.0–36.0)
MCV: 97.2 fL (ref 78.0–100.0)
Platelets: 89 10*3/uL — ABNORMAL LOW (ref 150–400)
RBC: 3.21 MIL/uL — AB (ref 3.87–5.11)
RDW: 14.8 % (ref 11.5–15.5)
WBC: 5.8 10*3/uL (ref 4.0–10.5)

## 2014-12-28 LAB — I-STAT BETA HCG BLOOD, ED (MC, WL, AP ONLY): I-stat hCG, quantitative: <5 m[IU]/mL (ref <5)

## 2014-12-28 LAB — LIPASE, BLOOD: Lipase: 65 U/L — ABNORMAL HIGH (ref 11–51)

## 2014-12-28 MED ORDER — HYDROMORPHONE HCL 1 MG/ML IJ SOLN
1.0000 mg | Freq: Once | INTRAMUSCULAR | Status: AC
  Administered 2014-12-28: 1 mg via INTRAVENOUS
  Filled 2014-12-28: qty 1

## 2014-12-28 MED ORDER — SODIUM CHLORIDE 0.9 % IV BOLUS (SEPSIS)
500.0000 mL | Freq: Once | INTRAVENOUS | Status: AC
  Administered 2014-12-28: 500 mL via INTRAVENOUS

## 2014-12-28 MED ORDER — FENTANYL CITRATE (PF) 100 MCG/2ML IJ SOLN
50.0000 ug | Freq: Once | INTRAMUSCULAR | Status: AC
  Administered 2014-12-28: 50 ug via INTRAVENOUS

## 2014-12-28 MED ORDER — FENTANYL CITRATE (PF) 100 MCG/2ML IJ SOLN
INTRAMUSCULAR | Status: AC
  Filled 2014-12-28: qty 2

## 2014-12-28 NOTE — ED Notes (Signed)
Sprite given, per PA order

## 2014-12-28 NOTE — ED Notes (Signed)
At d/c pt states [facility] Explained pt was medically cleared and discharged at this time. Pt given bus pass per request. Pt assisted to lobby in wheelchair. Pt vomited small amount 1x in lobby. Explained pt can continue to take prescribed meds from prev visits. No further questions/concerns. Joni ReiningNicole, [place] aware of situation and okay with pt d/c.

## 2014-12-28 NOTE — ED Notes (Signed)
Pt. arrived with PTAR from home reports intermittent upper/lower abdominal pain with diarrhea onset Sunday last week , denies diarrhea / no fever or chills.

## 2014-12-28 NOTE — ED Provider Notes (Signed)
CSN: [id number]     Arrival date & time 12/28/14  0505 History   First MD Initiated Contact with Patient 12/28/14 0600     Chief Complaint  Patient presents with  . Abdominal Pain     (Consider location/radiation/quality/duration/timing/severity/associated sxs/prior Treatment) HPI   Patient is a 24 year old female with history of autoimmune hepatitis, cirrhosis, ascites, esophageal varices with banding who presents to the ED with complaint of abdominal pain, onset 4 days. Patient reports having constant sharp upper and lower abdominal pain, worse with eating. She reports having multiple episodes of nonbloody diarrhea 4 days ago that have since resolved. Denies fever, chills, sore throat, cough, SOB, CP, nausea, vomiting, hemoptysis, hematemesis, urinary symptoms, blood in stool or urine, vaginal bleeding, vaginal discharge. Patient was seen in the ED on 11/25 for similar abdominal pain, she was given pain meds and IVF, negative CT abdomen, dx with UTI and discharged home with Macrobid. Patient reports she was feeling better until her abdominal pain started 4 days ago. She also reports having constant sharp lower back pain that started 4 days ago. No aggravating or alleviating factors. Denies any recent fall, trauma, injury. Pt denies numbness, tingling, saddle anesthesia, loss of bowel or bladder, weakness, IVDU, cancer or recent spinal manipulation.   Past Medical History  Diagnosis Date  . Anemia 2012  . Esophageal varices (HCC) 2015    s/p multiple EGDs and bandings dating to 2015.   Marland Kitchen. 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 GIB (gastrointestinal bleeding)   . Cirrhosis (HCC) 2003  . Irregular periods 08/04/2014  . Vaginal Pap smear, abnormal   . Hepatic encephalopathy (HCC)   . Coagulopathy (HCC)   . STD (female)     mutiple. risky sexual behavior.   Marland Kitchen. 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 Ascites 2003  . Psychosis    Past Surgical  History  Procedure Laterality Date  . Gastric banding port revision    . Tonsillectomy    . Esophagogastroduodenoscopy  07/22/2011    Procedure: ESOPHAGOGASTRODUODENOSCOPY (EGD);  Surgeon: Graylin ShiverSalem F Ganem, MD;  [facility];  Service: Endoscopy;  Laterality: N/A;  . Esophagogastroduodenoscopy N/A 09/11/2014    Procedure: ESOPHAGOGASTRODUODENOSCOPY (EGD);  Surgeon: Charlott RakesVincent Schooler, MD;  [facility];  Service: Endoscopy;  Laterality: N/A;   Family History  Problem Relation Age of Onset  . Adopted: Yes   Social History  Substance Use Topics  . Smoking status: Former Smoker -- 0.00 packs/day for 0 years  . Smokeless tobacco: Never Used  . Alcohol Use: No   OB History    Gravida Para Term Preterm AB TAB SAB Ectopic Multiple Living   0 0 0 0 0 0 0 0 0 0      Review of Systems  Gastrointestinal: Positive for abdominal pain and diarrhea.  Musculoskeletal: Positive for back pain.  All other systems reviewed and are negative.     Allergies  Nsaids; Tylenol; and Amoxicillin  Home Medications   Prior to Admission medications   Medication Sig Start Date End Date Taking? Authorizing Provider  azaTHIOprine (IMURAN) 50 MG tablet Take 50 mg by mouth daily.     Historical Provider, MD  carvedilol (COREG) 3.125 MG tablet Take 3.125 mg by mouth 2 (two) times daily with a meal.     Historical Provider, MD  furosemide (LASIX) 20 MG tablet Take 2 tablets (40 mg total) by mouth daily. Patient taking differently: Take 20 mg by mouth  2 (two) times daily.  10/12/14   Shanker Levora Dredge, MD  lactulose (CHRONULAC) 10 GM/15ML solution Take 30 mLs (20 g total) by mouth 2 (two) times daily. Patient taking differently: Take 20 g by mouth 3 (three) times daily.  09/13/14   Vilinda Blanks Minor, NP  magnesium oxide (MAG-OX) 400 MG tablet Take 400 mg by mouth daily.    Historical Provider, MD  metoCLOPramide (REGLAN) 5 MG tablet Take 5 mg by mouth 2 (two) times daily.     Historical Provider, MD   nitrofurantoin, macrocrystal-monohydrate, (MACROBID) 100 MG capsule Take 1 capsule (100 mg total) by mouth 2 (two) times daily. 12/23/14   Maris Berger, MD  omeprazole (PRILOSEC) 40 MG capsule Take 40 mg by mouth daily.    Historical Provider, MD  ondansetron (ZOFRAN ODT) 4 MG disintegrating tablet  ODT q4 hours prn nausea/vomit Patient not taking: Reported on 12/23/2014 11/17/14   Loren Racer, MD  ondansetron (ZOFRAN ODT) 4 MG disintegrating tablet Take 1 tablet (4 mg total) by mouth every 8 (eight) hours as needed for nausea or vomiting. 12/23/14   Maris Berger, MD  ondansetron (ZOFRAN) 4 MG tablet Take 1 tablet (4 mg total) by mouth every 8 (eight) hours as needed for nausea or vomiting. Patient not taking: Reported on 11/23/2014 10/12/14   Maretta Bees, MD  oxyCODONE (ROXICODONE) 5 MG immediate release tablet Take 1 tablet (5 mg total) by mouth every 4 (four) hours as needed for severe pain. Patient not taking: Reported on 12/23/2014 11/17/14   Loren Racer, MD  predniSONE (DELTASONE) 5 MG tablet Take 5 mg by mouth daily with breakfast.    Historical Provider, MD  rifaximin (XIFAXAN) 550 MG TABS tablet Take 1 tablet (550 mg total) by mouth 2 (two) times daily. Patient not taking: Reported on 12/23/2014 09/13/14   Vilinda Blanks Minor, NP  spironolactone (ALDACTONE) 50 MG tablet Take 150 mg by mouth daily.    Historical Provider, MD  ursodiol (ACTIGALL) 300 MG capsule Take 300 mg by mouth 2 (two) times daily.     Historical Provider, MD  valGANciclovir (VALCYTE) 450 MG tablet Take 2 tablets (900 mg total) by mouth 2 (two) times daily. 10/12/14   Shanker Levora Dredge, MD   BP 112/81 mmHg  Pulse 109  Temp(Src) 98.4 F (36.9 C) (Oral)  Resp 16  SpO2 96% Physical Exam  Constitutional: She is oriented to person, place, and time. She appears well-developed and well-nourished. No distress.  HENT:  Head: Normocephalic and atraumatic.  Mouth/Throat: Oropharynx is clear and moist. No  oropharyngeal exudate.  Eyes: Conjunctivae and EOM are normal. Right eye exhibits no discharge. Left eye exhibits no discharge. No scleral icterus.  Neck: Normal range of motion. Neck supple.  Cardiovascular: Normal rate, regular rhythm, normal heart sounds and intact distal pulses.   Pulmonary/Chest: Effort normal and breath sounds normal. No respiratory distress. She has no wheezes. She has no rales. She exhibits no tenderness.  Abdominal: Soft. Bowel sounds are normal. She exhibits no mass. There is tenderness in the right upper quadrant, epigastric area and left upper quadrant. There is no rigidity, no rebound, no guarding and no CVA tenderness.  Mild abdominal distention.  Musculoskeletal: Normal range of motion. She exhibits no edema.       Lumbar back: She exhibits tenderness. She exhibits normal range of motion, no bony tenderness, no swelling, no edema, no deformity, no laceration and no spasm.  No C/T/L midline tenderness. Mild tenderness at bilateral lower paraspinal  muscles. FROM of back. 5/5 strength of BLE. 2+ PT pulses. Sensation intact.   Lymphadenopathy:    She has no cervical adenopathy.  Neurological: She is alert and oriented to person, place, and time.  Skin: Skin is warm and dry.  Nursing note and vitals reviewed.   ED Course  Procedures (including critical care time) Labs Review Labs Reviewed  LIPASE, BLOOD - Abnormal; Notable for the following:    Lipase 65 (*)    All other components within normal limits  COMPREHENSIVE METABOLIC PANEL - Abnormal; Notable for the following:    Sodium 124 (*)    Chloride 95 (*)    Glucose, Bld 113 (*)    Calcium 8.3 (*)    Total Protein 5.8 (*)    Albumin 2.5 (*)    AST 81 (*)    ALT 68 (*)    Alkaline Phosphatase 573 (*)    Total Bilirubin 6.7 (*)    All other components within normal limits  CBC - Abnormal; Notable for the following:    RBC 3.21 (*)    Hemoglobin 10.8 (*)    HCT 31.2 (*)    Platelets 89 (*)    All  other components within normal limits  URINALYSIS, ROUTINE W REFLEX MICROSCOPIC (NOT AT [facility]) - Abnormal; Notable for the following:    APPearance CLOUDY (*)    All other components within normal limits  I-STAT BETA HCG BLOOD, ED ([facility], [facility], [facility] ONLY)    Imaging Review No results found. I have personally reviewed and evaluated these images and lab results as part of my medical decision-making.   MDM   Final diagnoses:  Pain of upper abdomen    Patient presents with complaint of upper and lower abdominal pain, multiple episodes of nonbloody diarrhea that have since resolved and lower back pain. Denies any recent fall, trauma, injury. Denies fever. No back pain red flags. Patient was seen in the ED on 11/25 for similar abdominal pain, given IVF and pain meds, labs at baseline, CT abdomen negative, patient diagnosed with UTI and discharged home with Macrobid. History of autoimmune hepatitis, cirrhosis, ascites and primary sclerosing cholangitis. Patient is currently on the transplant list.  Afebrile, normotensive, tachycardic. Exam revealed abdominal distention with tenderness in the epigastric and right/left upper quadrants, no peritoneal signs. Mild tenderness at bilateral lumbar paraspinal muscles, no midline tenderness, no neuro deficits.  Patient given pain meds. Labs consistent with dehydration, otherwise labs appear to be at patient's baseline values. UA unremarkable. Negative pregnancy. I do not suspect SBP and do not feel that a paracentesis is indicated at this time.  7:30 - Pt reports her pain has improved from 9/10 to 5/10. Pt able to tolerate PO. HR is still elevated in 110s. Plan give bolus IVF and one more dose of pain meds. No signs or symptoms of PE at this time other than tachycardia, I feel that a PE is very unlikely at this time. HR dec. To 104 s/p IVF. Plan to d/c pt home with close PCP/GI follow up. Discussed results and plan for d/c with pt.   Evaluation does not show  pathology requring ongoing emergent intervention or admission. Pt is hemodynamically stable and mentating appropriately.  All questions answered. Return precautions discussed and outpatient follow up given.    Satira Sark [place], [place] 12/28/14 1191  Tomasita Crumble, MD 12/28/14 [phone]

## 2014-12-28 NOTE — Discharge Instructions (Signed)
Continue taking her medications as prescribed. Follow up with your primary care provider in 2-3 days. I also recommend following up with Gastroenterology this week.  Please return to the Emergency Department if symptoms worsen or new onset of fever, chills, vomiting, vomiting blood, blood in urine or stool.

## 2014-12-29 ENCOUNTER — Ambulatory Visit
Admission: RE | Admit: 2014-12-29 | Discharge: 2014-12-29 | Disposition: A | Discharge status: Home / Self Care | Payer: BC Managed Care – PPO | Source: Ambulatory Visit | Attending: Internal Medicine | Admitting: Internal Medicine

## 2014-12-29 DIAGNOSIS — R188 Other ascites: Secondary | ICD-10-CM | POA: Insufficient documentation

## 2014-12-29 DIAGNOSIS — K754 Autoimmune hepatitis: Secondary | ICD-10-CM

## 2014-12-29 DIAGNOSIS — K8301 Primary sclerosing cholangitis: Secondary | ICD-10-CM

## 2014-12-29 MED ORDER — ALBUMIN HUMAN 25 % IV SOLN
25.0000 g | Freq: Once | INTRAVENOUS | Status: AC
  Administered 2014-12-29: 25 g via INTRAVENOUS
  Filled 2014-12-29: qty 100

## 2014-12-29 MED ORDER — GI COCKTAIL ~~LOC~~
30.0000 mL | Freq: Once | ORAL | Status: AC
  Administered 2014-12-29: 30 mL via ORAL
  Filled 2014-12-29: qty 30

## 2014-12-29 NOTE — Progress Notes (Signed)
Patient c/o post paracentesis-Dr. Fredia SorrowYamagata notified.  Pt. Stated she uses "GI cocktail" at home.  Medicated per order.  Pt. States pain is gone 30 min. Later.  Discharged home.

## 2015-01-04 ENCOUNTER — Encounter (HOSPITAL_COMMUNITY): Payer: Self-pay | Admitting: Emergency Medicine

## 2015-01-04 ENCOUNTER — Emergency Department (HOSPITAL_COMMUNITY)
Admission: EM | Admit: 2015-01-04 | Discharge: 2015-01-04 | Disposition: A | Discharge status: Home / Self Care | Payer: BC Managed Care – PPO | Attending: Emergency Medicine | Admitting: Emergency Medicine

## 2015-01-04 DIAGNOSIS — Z862 Personal history of diseases of the blood and blood-forming organs and certain disorders involving the immune mechanism: Secondary | ICD-10-CM | POA: Diagnosis not present

## 2015-01-04 DIAGNOSIS — Z8659 Personal history of other mental and behavioral disorders: Secondary | ICD-10-CM | POA: Diagnosis not present

## 2015-01-04 DIAGNOSIS — Z3202 Encounter for pregnancy test, result negative: Secondary | ICD-10-CM | POA: Insufficient documentation

## 2015-01-04 DIAGNOSIS — Z8619 Personal history of other infectious and parasitic diseases: Secondary | ICD-10-CM | POA: Diagnosis not present

## 2015-01-04 DIAGNOSIS — Z87891 Personal history of nicotine dependence: Secondary | ICD-10-CM | POA: Diagnosis not present

## 2015-01-04 DIAGNOSIS — Z792 Long term (current) use of antibiotics: Secondary | ICD-10-CM | POA: Diagnosis not present

## 2015-01-04 DIAGNOSIS — Z8742 Personal history of other diseases of the female genital tract: Secondary | ICD-10-CM | POA: Insufficient documentation

## 2015-01-04 DIAGNOSIS — R1084 Generalized abdominal pain: Secondary | ICD-10-CM | POA: Insufficient documentation

## 2015-01-04 DIAGNOSIS — Z880 Allergy status to penicillin: Secondary | ICD-10-CM | POA: Insufficient documentation

## 2015-01-04 DIAGNOSIS — K7460 Unspecified cirrhosis of liver: Secondary | ICD-10-CM | POA: Insufficient documentation

## 2015-01-04 DIAGNOSIS — R1030 Lower abdominal pain, unspecified: Secondary | ICD-10-CM | POA: Diagnosis present

## 2015-01-04 DIAGNOSIS — Z79899 Other long term (current) drug therapy: Secondary | ICD-10-CM | POA: Diagnosis not present

## 2015-01-04 DIAGNOSIS — R000 Tachycardia, unspecified: Secondary | ICD-10-CM | POA: Insufficient documentation

## 2015-01-04 LAB — CBC
HEMATOCRIT: 31.2 % — AB (ref 36.0–46.0)
Hemoglobin: 10.9 g/dL — ABNORMAL LOW (ref 12.0–15.0)
MCH: 33.6 pg (ref 26.0–34.0)
MCHC: 34.9 g/dL (ref 30.0–36.0)
MCV: 96.3 fL (ref 78.0–100.0)
Platelets: 66 10*3/uL — ABNORMAL LOW (ref 150–400)
RBC: 3.24 MIL/uL — AB (ref 3.87–5.11)
RDW: 16.5 % — ABNORMAL HIGH (ref 11.5–15.5)
WBC: 4 10*3/uL (ref 4.0–10.5)

## 2015-01-04 LAB — URINALYSIS, ROUTINE W REFLEX MICROSCOPIC
GLUCOSE, UA: NEGATIVE mg/dL
HGB URINE DIPSTICK: NEGATIVE
Ketones, ur: NEGATIVE mg/dL
LEUKOCYTES UA: NEGATIVE
Nitrite: NEGATIVE
PROTEIN: NEGATIVE mg/dL
SPECIFIC GRAVITY, URINE: 1.013 (ref 1.005–1.030)
pH: 6.5 (ref 5.0–8.0)

## 2015-01-04 LAB — COMPREHENSIVE METABOLIC PANEL
ALT: 90 U/L — ABNORMAL HIGH (ref 14–54)
AST: 142 U/L — AB (ref 15–41)
Albumin: 2.8 g/dL — ABNORMAL LOW (ref 3.5–5.0)
Alkaline Phosphatase: 710 U/L — ABNORMAL HIGH (ref 38–126)
Anion gap: 7 (ref 5–15)
BUN: 5 mg/dL — AB (ref 6–20)
CHLORIDE: 101 mmol/L (ref 101–111)
CO2: 19 mmol/L — ABNORMAL LOW (ref 22–32)
Calcium: 8.3 mg/dL — ABNORMAL LOW (ref 8.9–10.3)
Creatinine, Ser: 0.53 mg/dL (ref 0.44–1.00)
Glucose, Bld: 99 mg/dL (ref 65–99)
POTASSIUM: 3.8 mmol/L (ref 3.5–5.1)
Sodium: 127 mmol/L — ABNORMAL LOW (ref 135–145)
Total Bilirubin: 7.4 mg/dL — ABNORMAL HIGH (ref 0.3–1.2)
Total Protein: 5.7 g/dL — ABNORMAL LOW (ref 6.5–8.1)

## 2015-01-04 LAB — I-STAT BETA HCG BLOOD, ED (MC, WL, AP ONLY)

## 2015-01-04 LAB — LIPASE, BLOOD: LIPASE: 64 U/L — AB (ref 11–51)

## 2015-01-04 MED ORDER — HYDROMORPHONE HCL 1 MG/ML IJ SOLN
1.0000 mg | INTRAMUSCULAR | Status: DC | PRN
  Administered 2015-01-04 (×2): 1 mg via INTRAVENOUS
  Filled 2015-01-04 (×2): qty 1

## 2015-01-04 NOTE — Discharge Instructions (Signed)
Abdominal Pain, Adult Melanie Conley, see your primary care doctor within 3 days for close follow up.  If symptoms worsen, come back to the ED immediately.  Thank you. Many things can cause belly (abdominal) pain. Most times, the belly pain is not dangerous. Many cases of belly pain can be watched and treated at home. HOME CARE   Do not take medicines that help you go poop (laxatives) unless told to by your doctor.  Only take medicine as told by your doctor.  Eat or drink as told by your doctor. Your doctor will tell you if you should be on a special diet. GET HELP IF:  You do not know what is causing your belly pain.  You have belly pain while you are sick to your stomach (nauseous) or have runny poop (diarrhea).  You have pain while you pee or poop.  Your belly pain wakes you up at night.  You have belly pain that gets worse or better when you eat.  You have belly pain that gets worse when you eat fatty foods.  You have a fever. GET HELP RIGHT AWAY IF:   The pain does not go away within 2 hours.  You keep throwing up (vomiting).  The pain changes and is only in the right or left part of the belly.  You have bloody or tarry looking poop. MAKE SURE YOU:   Understand these instructions.  Will watch your condition.  Will get help right away if you are not doing well or get worse.   This information is not intended to replace advice given to you by your health care provider. Make sure you discuss any questions you have with your health care provider.   Document Released: 07/03/2007 Document Revised: 02/04/2014 Document Reviewed: 09/23/2012 Elsevier Interactive Patient Education [employer]2016 [employer] Inc[employer]

## 2015-01-04 NOTE — ED Provider Notes (Signed)
CSN: [id number]     Arrival date & time 01/04/15  4098 History  By signing my name below, I, Arianna Nassar, attest that this documentation has been prepared under the direction and in the presence of Tomasita Crumble, MD. Electronically Signed: Octavia Heir, ED Scribe. 01/04/2015. 3:59 AM.    Chief Complaint  Patient presents with  . Abdominal Pain     The history is provided by the patient. No language interpreter was used.   HPI Comments: Melanie Conley is a 24 y.o. female who has a hx of cirrhosis presents to the Emergency Department complaining of constant, gradual worsening, lower abdominal pain onset this evening. Pt notes having an abdominal hernia. She reports she has not had a period for months. Pt states this pain is similar to her previous episodes of pain in the past. Pt denies vomiting, diarrhea, urinary symptoms, vaginal discharge, and fever.   Past Medical History  Diagnosis Date  . Anemia 2012  . Esophageal varices (HCC) 2015    s/p multiple EGDs and bandings dating to 2015.   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 GIB (gastrointestinal bleeding)   . Cirrhosis (HCC) 2003  . Irregular periods 08/04/2014  . Vaginal Pap smear, abnormal   . Hepatic encephalopathy (HCC)   . Coagulopathy (HCC)   . STD (female)     mutiple. risky sexual behavior.   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 Ascites 2003  . Psychosis    Past Surgical History  Procedure Laterality Date  . Gastric banding port revision    . Tonsillectomy    . Esophagogastroduodenoscopy  07/22/2011    Procedure: ESOPHAGOGASTRODUODENOSCOPY (EGD);  Surgeon: Graylin Shiver, MD;  Location: [facility] ENDOSCOPY;  Service: Endoscopy;  Laterality: N/A;  . Esophagogastroduodenoscopy N/A 09/11/2014    Procedure: ESOPHAGOGASTRODUODENOSCOPY (EGD);  Surgeon: Charlott Rakes, MD;  Location: [facility] ENDOSCOPY;  Service: Endoscopy;  Laterality: N/A;   Family History  Problem Relation  Age of Onset  . Adopted: Yes   Social History  Substance Use Topics  . Smoking status: Former Smoker -- 0.50 packs/day for 8 years    Types: Cigarettes  . Smokeless tobacco: Never Used  . Alcohol Use: No   OB History    Gravida Para Term Preterm AB TAB SAB Ectopic Multiple Living       Review of Systems  A complete 10 system review of systems was obtained and all systems are negative except as noted in the HPI and PMH.    Allergies  Nsaids; Tylenol; and Amoxicillin  Home Medications   Prior to Admission medications   Medication Sig Start Date End Date Taking? Authorizing Provider  azaTHIOprine (IMURAN) 50 MG tablet Take 25 mg by mouth daily.     Historical Provider, MD  carvedilol (COREG) 3.125 MG tablet Take 3.125 mg by mouth 2 (two) times daily with a meal.     Historical Provider, MD  furosemide (LASIX) 20 MG tablet Take 2 tablets (40 mg total) by mouth daily. Patient taking differently: Take 20 mg by mouth 2 (two) times daily.  10/12/14   Shanker Levora Dredge, MD  furosemide (LASIX) 40 MG tablet Take 30 mg by mouth 2 (two) times daily.    Historical Provider, MD  lactulose (CHRONULAC) 10 GM/15ML solution Take 30 mLs (20 g total) by mouth 2 (two) times daily. Patient taking differently: Take 20 g  by mouth 3 (three) times daily.  09/13/14   Vilinda BlanksWilliam S Minor, NP  magnesium oxide (MAG-OX) 400 MG tablet Take 400 mg by mouth daily.    Historical Provider, MD  metoCLOPramide (REGLAN) 5 MG tablet Take 5 mg by mouth 2 (two) times daily.     Historical Provider, MD  nitrofurantoin, macrocrystal-monohydrate, (MACROBID) 100 MG capsule Take 1 capsule (100 mg total) by mouth 2 (two) times daily. 12/23/14   Maris BergerJonah Gunalda, MD  omeprazole (PRILOSEC) 40 MG capsule Take 40 mg by mouth daily.    Historical Provider, MD  ondansetron (ZOFRAN ODT) 4 MG disintegrating tablet 4mg  ODT q4 hours prn nausea/vomit Patient not taking: Reported on 12/23/2014 11/17/14   Loren Raceravid Yelverton, MD   ondansetron (ZOFRAN ODT) 4 MG disintegrating tablet Take 1 tablet (4 mg total) by mouth every 8 (eight) hours as needed for nausea or vomiting. 12/23/14   Maris BergerJonah Gunalda, MD  ondansetron (ZOFRAN) 4 MG tablet Take 1 tablet (4 mg total) by mouth every 8 (eight) hours as needed for nausea or vomiting. Patient not taking: Reported on 11/23/2014 10/12/14   Maretta BeesShanker M Ghimire, MD  oxyCODONE (ROXICODONE) 5 MG immediate release tablet Take 1 tablet (5 mg total) by mouth every 4 (four) hours as needed for severe pain. Patient not taking: Reported on 12/23/2014 11/17/14   Loren Raceravid Yelverton, MD  predniSONE (DELTASONE) 5 MG tablet Take 5 mg by mouth daily with breakfast.    Historical Provider, MD  rifaximin (XIFAXAN) 550 MG TABS tablet Take 1 tablet (550 mg total) by mouth 2 (two) times daily. Patient not taking: Reported on 12/23/2014 09/13/14   Vilinda BlanksWilliam S Minor, NP  spironolactone (ALDACTONE) 50 MG tablet Take 150 mg by mouth daily.    Historical Provider, MD  ursodiol (ACTIGALL) 300 MG capsule Take 300 mg by mouth 2 (two) times daily.     Historical Provider, MD  valGANciclovir (VALCYTE) 450 MG tablet Take 2 tablets (900 mg total) by mouth 2 (two) times daily. 10/12/14   Shanker Levora DredgeM Ghimire, MD   Triage vitals: BP 113/81 mmHg  Pulse 110  Temp(Src) 98.1 F (36.7 C) (Oral)  Resp 15  Ht 4\' 11"  (1.499 m)  Wt 102 lb (46.267 kg)  BMI 20.59 kg/m2  SpO2 100% Physical Exam  Constitutional: She is oriented to person, place, and time. She appears well-developed and well-nourished. No distress.  HENT:  Head: Normocephalic and atraumatic.  Nose: Nose normal.  Mouth/Throat: Oropharynx is clear and moist. No oropharyngeal exudate.  Eyes: Conjunctivae and EOM are normal. Pupils are equal, round, and reactive to light. No scleral icterus.  Neck: Normal range of motion. Neck supple. No JVD present. No tracheal deviation present. No thyromegaly present.  Cardiovascular: Regular rhythm and normal heart sounds.  Tachycardia  present.  Exam reveals no gallop and no friction rub.   No murmur heard. Pulmonary/Chest: Effort normal and breath sounds normal. No respiratory distress. She has no wheezes. She exhibits no tenderness.  Abdominal: Soft. Bowel sounds are normal. She exhibits no distension and no mass. There is no tenderness. There is no rebound and no guarding.  Musculoskeletal: Normal range of motion. She exhibits no edema or tenderness.  Lymphadenopathy:    She has no cervical adenopathy.  Neurological: She is alert and oriented to person, place, and time. No cranial nerve deficit. She exhibits normal muscle tone.  Skin: Skin is warm and dry. No rash noted. No erythema. No pallor.  Nursing note and vitals reviewed.   ED Course  Procedures  DIAGNOSTIC STUDIES: Oxygen Saturation is 100% on RA, normal by my interpretation.  COORDINATION OF CARE:  3:57 AM Discussed treatment plan with pt at bedside and pt agreed to plan.  Labs Review Labs Reviewed  COMPREHENSIVE METABOLIC PANEL - Abnormal; Notable for the following:    Sodium 127 (*)    CO2 19 (*)    BUN 5 (*)    Calcium 8.3 (*)    Total Protein 5.7 (*)    Albumin 2.8 (*)    AST 142 (*)    ALT 90 (*)    Alkaline Phosphatase 710 (*)    Total Bilirubin 7.4 (*)    All other components within normal limits  CBC - Abnormal; Notable for the following:    RBC 3.24 (*)    Hemoglobin 10.9 (*)    HCT 31.2 (*)    RDW 16.5 (*)    Platelets 66 (*)    All other components within normal limits  LIPASE, BLOOD - Abnormal; Notable for the following:    Lipase 64 (*)    All other components within normal limits  URINALYSIS, ROUTINE W REFLEX MICROSCOPIC (NOT AT [facility])  I-STAT BETA HCG BLOOD, ED ([facility], [facility], [facility] ONLY)    Imaging Review No results found. I have personally reviewed and evaluated these images and lab results as part of my medical decision-making.   EKG Interpretation None      MDM   Final diagnoses:  None   patient presents to emergency  department for abdominal pain. She has been to this emergency department multiple times for similar complaints. Her last paracentesis was 2 weeks ago and was negative for SBP. I have low concern for this diagnosis at this time. Her abdomen is soft and nontender. Laboratory studies are abnormal however they're all at her baseline. She was given Dilaudid for pain control. She continues to be tachycardic, looking at prior records she is always tachycardic. Upon repeat evaluation, patient states she feels better. Primary care follow-up is advised, she is in no acute distress, vital signs within her normal limits and she is safe for discharge.     I personally performed the services described in this documentation, which was scribed in my presence. The recorded information has been reviewed and is accurate.     Tomasita Crumble, MD 01/04/15 [phone]

## 2015-01-04 NOTE — ED Notes (Addendum)
Pt arrives POV with c/o abdominal pain for 40 minutes. When asked if this pain was similar too her typical abdominal pain, pt states she does not know. States right in the center of her stomach where "I have a hernia." Denies N/V/D. Ambulatory, alertx4. Pt began moaning halfway through triage.

## 2015-01-04 NOTE — ED Notes (Signed)
Patient verbalized understanding of discharge instructions and denies any further needs or questions at this time. VS stable. Assisted patient to ED lobby in wheelchair.

## 2015-01-05 ENCOUNTER — Other Ambulatory Visit (HOSPITAL_COMMUNITY): Payer: Self-pay | Admitting: Internal Medicine

## 2015-01-05 DIAGNOSIS — R188 Other ascites: Secondary | ICD-10-CM

## 2015-01-06 ENCOUNTER — Encounter (HOSPITAL_COMMUNITY): Payer: Self-pay

## 2015-01-06 ENCOUNTER — Ambulatory Visit (HOSPITAL_COMMUNITY): Payer: BC Managed Care – PPO

## 2015-01-06 ENCOUNTER — Other Ambulatory Visit (HOSPITAL_COMMUNITY): Payer: Self-pay

## 2015-01-09 ENCOUNTER — Ambulatory Visit: Admission: RE | Admit: 2015-01-09 | Payer: BC Managed Care – PPO | Source: Ambulatory Visit

## 2015-01-09 ENCOUNTER — Emergency Department (HOSPITAL_COMMUNITY)
Admission: EM | Admit: 2015-01-09 | Discharge: 2015-01-09 | Disposition: A | Discharge status: Home / Self Care | Payer: BC Managed Care – PPO | Attending: Emergency Medicine | Admitting: Emergency Medicine

## 2015-01-09 ENCOUNTER — Encounter (HOSPITAL_COMMUNITY): Payer: Self-pay | Admitting: Oncology

## 2015-01-09 DIAGNOSIS — Z8659 Personal history of other mental and behavioral disorders: Secondary | ICD-10-CM | POA: Diagnosis not present

## 2015-01-09 DIAGNOSIS — Z79899 Other long term (current) drug therapy: Secondary | ICD-10-CM | POA: Insufficient documentation

## 2015-01-09 DIAGNOSIS — N390 Urinary tract infection, site not specified: Secondary | ICD-10-CM

## 2015-01-09 DIAGNOSIS — Z862 Personal history of diseases of the blood and blood-forming organs and certain disorders involving the immune mechanism: Secondary | ICD-10-CM | POA: Diagnosis not present

## 2015-01-09 DIAGNOSIS — Z8719 Personal history of other diseases of the digestive system: Secondary | ICD-10-CM | POA: Diagnosis not present

## 2015-01-09 DIAGNOSIS — R7989 Other specified abnormal findings of blood chemistry: Secondary | ICD-10-CM

## 2015-01-09 DIAGNOSIS — Z7952 Long term (current) use of systemic steroids: Secondary | ICD-10-CM | POA: Diagnosis not present

## 2015-01-09 DIAGNOSIS — R110 Nausea: Secondary | ICD-10-CM | POA: Diagnosis not present

## 2015-01-09 DIAGNOSIS — Z8619 Personal history of other infectious and parasitic diseases: Secondary | ICD-10-CM | POA: Insufficient documentation

## 2015-01-09 DIAGNOSIS — R945 Abnormal results of liver function studies: Secondary | ICD-10-CM | POA: Diagnosis not present

## 2015-01-09 DIAGNOSIS — Z87891 Personal history of nicotine dependence: Secondary | ICD-10-CM | POA: Insufficient documentation

## 2015-01-09 DIAGNOSIS — R109 Unspecified abdominal pain: Secondary | ICD-10-CM

## 2015-01-09 DIAGNOSIS — Z8669 Personal history of other diseases of the nervous system and sense organs: Secondary | ICD-10-CM | POA: Insufficient documentation

## 2015-01-09 DIAGNOSIS — G8929 Other chronic pain: Secondary | ICD-10-CM | POA: Diagnosis not present

## 2015-01-09 DIAGNOSIS — Z880 Allergy status to penicillin: Secondary | ICD-10-CM | POA: Diagnosis not present

## 2015-01-09 DIAGNOSIS — R000 Tachycardia, unspecified: Secondary | ICD-10-CM | POA: Insufficient documentation

## 2015-01-09 DIAGNOSIS — R1084 Generalized abdominal pain: Secondary | ICD-10-CM | POA: Diagnosis present

## 2015-01-09 LAB — URINALYSIS, ROUTINE W REFLEX MICROSCOPIC
GLUCOSE, UA: NEGATIVE mg/dL
Hgb urine dipstick: NEGATIVE
KETONES UR: 15 mg/dL — AB
NITRITE: POSITIVE — AB
PH: 6 (ref 5.0–8.0)
PROTEIN: NEGATIVE mg/dL
Specific Gravity, Urine: 1.027 (ref 1.005–1.030)

## 2015-01-09 LAB — LIPASE, BLOOD: LIPASE: 59 U/L — AB (ref 11–51)

## 2015-01-09 LAB — URINE MICROSCOPIC-ADD ON: RBC / HPF: NONE SEEN RBC/hpf (ref 0–5)

## 2015-01-09 LAB — COMPREHENSIVE METABOLIC PANEL
ALT: 100 U/L — ABNORMAL HIGH (ref 14–54)
ANION GAP: 11 (ref 5–15)
AST: 156 U/L — ABNORMAL HIGH (ref 15–41)
Albumin: 3.3 g/dL — ABNORMAL LOW (ref 3.5–5.0)
Alkaline Phosphatase: 711 U/L — ABNORMAL HIGH (ref 38–126)
BILIRUBIN TOTAL: 10 mg/dL — AB (ref 0.3–1.2)
BUN: 8 mg/dL (ref 6–20)
CHLORIDE: 91 mmol/L — AB (ref 101–111)
CO2: 22 mmol/L (ref 22–32)
Calcium: 8.4 mg/dL — ABNORMAL LOW (ref 8.9–10.3)
Creatinine, Ser: 0.46 mg/dL (ref 0.44–1.00)
Glucose, Bld: 144 mg/dL — ABNORMAL HIGH (ref 65–99)
POTASSIUM: 3.6 mmol/L (ref 3.5–5.1)
Sodium: 124 mmol/L — ABNORMAL LOW (ref 135–145)
TOTAL PROTEIN: 6.8 g/dL (ref 6.5–8.1)

## 2015-01-09 LAB — CBC
HEMATOCRIT: 33.4 % — AB (ref 36.0–46.0)
Hemoglobin: 11.6 g/dL — ABNORMAL LOW (ref 12.0–15.0)
MCH: 33.7 pg (ref 26.0–34.0)
MCHC: 34.7 g/dL (ref 30.0–36.0)
MCV: 97.1 fL (ref 78.0–100.0)
Platelets: 105 10*3/uL — ABNORMAL LOW (ref 150–400)
RBC: 3.44 MIL/uL — AB (ref 3.87–5.11)
RDW: 17 % — AB (ref 11.5–15.5)
WBC: 7 10*3/uL (ref 4.0–10.5)

## 2015-01-09 LAB — AMMONIA: AMMONIA: 120 umol/L — AB (ref 9–35)

## 2015-01-09 MED ORDER — FOSFOMYCIN TROMETHAMINE 3 G PO PACK
3.0000 g | PACK | Freq: Once | ORAL | Status: AC
  Administered 2015-01-09: 3 g via ORAL
  Filled 2015-01-09: qty 3

## 2015-01-09 MED ORDER — HYDROMORPHONE HCL 1 MG/ML IJ SOLN
1.0000 mg | Freq: Once | INTRAMUSCULAR | Status: AC
  Administered 2015-01-09: 1 mg via INTRAVENOUS
  Filled 2015-01-09: qty 1

## 2015-01-09 MED ORDER — ONDANSETRON HCL 4 MG/2ML IJ SOLN
4.0000 mg | Freq: Once | INTRAMUSCULAR | Status: AC
  Administered 2015-01-09: 4 mg via INTRAVENOUS
  Filled 2015-01-09: qty 2

## 2015-01-09 NOTE — ED Notes (Signed)
Per EMS pt presents d/t abdominal pain and distension that is worse than usual.  +nausea.  Tachycardic.  Jaundice noted to sclera.

## 2015-01-09 NOTE — ED Notes (Signed)
Bed: [id number] Expected date:  Expected time:  Means of arrival:  Comments: EMS 24yo F abd pain and distension

## 2015-01-09 NOTE — Discharge Instructions (Signed)
Read the information below.  You may return to the Emergency Department at any time for worsening condition or any new symptoms that concern you.  If you develop high fevers, worsening abdominal pain, uncontrolled vomiting, or are unable to tolerate fluids by mouth, return to the ER for a recheck.     Abdominal Pain, Adult Many things can cause abdominal pain. Usually, abdominal pain is not caused by a disease and will improve without treatment. It can often be observed and treated at home. Your health care provider will do a physical exam and possibly order blood tests and X-rays to help determine the seriousness of your pain. However, in many cases, more time must pass before a clear cause of the pain can be found. Before that point, your health care provider may not know if you need more testing or further treatment. HOME CARE INSTRUCTIONS Monitor your abdominal pain for any changes. The following actions may help to alleviate any discomfort you are experiencing:  Only take over-the-counter or prescription medicines as directed by your health care provider.  Do not take laxatives unless directed to do so by your health care provider.  Try a clear liquid diet (broth, tea, or water) as directed by your health care provider. Slowly move to a bland diet as tolerated. SEEK MEDICAL CARE IF:  You have unexplained abdominal pain.  You have abdominal pain associated with nausea or diarrhea.  You have pain when you urinate or have a bowel movement.  You experience abdominal pain that wakes you in the night.  You have abdominal pain that is worsened or improved by eating food.  You have abdominal pain that is worsened with eating fatty foods.  You have a fever. SEEK IMMEDIATE MEDICAL CARE IF:  Your pain does not go away within 2 hours.  You keep throwing up (vomiting).  Your pain is felt only in portions of the abdomen, such as the right side or the left lower portion of the abdomen.  You  pass bloody or black tarry stools. MAKE SURE YOU:  Understand these instructions.  Will watch your condition.  Will get help right away if you are not doing well or get worse.   This information is not intended to replace advice given to you by your health care provider. Make sure you discuss any questions you have with your health care provider.   Document Released: 10/24/2004 Document Revised: 10/05/2014 Document Reviewed: 09/23/2012 Elsevier Interactive Patient Education 2016 Elsevier Inc.  Urinary Tract Infection Urinary tract infections (UTIs) can develop anywhere along your urinary tract. Your urinary tract is your body's drainage system for removing wastes and extra water. Your urinary tract includes two kidneys, two ureters, a bladder, and a urethra. Your kidneys are a pair of bean-shaped organs. Each kidney is about the size of your fist. They are located below your ribs, one on each side of your spine. CAUSES Infections are caused by microbes, which are microscopic organisms, including fungi, viruses, and bacteria. These organisms are so small that they can only be seen through a microscope. Bacteria are the microbes that most commonly cause UTIs. SYMPTOMS  Symptoms of UTIs may vary by age and gender of the patient and by the location of the infection. Symptoms in young women typically include a frequent and intense urge to urinate and a painful, burning feeling in the bladder or urethra during urination. Older women and men are more likely to be tired, shaky, and weak and have muscle aches and  abdominal pain. A fever may mean the infection is in your kidneys. Other symptoms of a kidney infection include pain in your back or sides below the ribs, nausea, and vomiting. DIAGNOSIS To diagnose a UTI, your caregiver will ask you about your symptoms. Your caregiver will also ask you to provide a urine sample. The urine sample will be tested for bacteria and white blood cells. White blood  cells are made by your body to help fight infection. TREATMENT  Typically, UTIs can be treated with medication. Because most UTIs are caused by a bacterial infection, they usually can be treated with the use of antibiotics. The choice of antibiotic and length of treatment depend on your symptoms and the type of bacteria causing your infection. HOME CARE INSTRUCTIONS  If you were prescribed antibiotics, take them exactly as your caregiver instructs you. Finish the medication even if you feel better after you have only taken some of the medication.  Drink enough water and fluids to keep your urine clear or pale yellow.  Avoid caffeine, tea, and carbonated beverages. They tend to irritate your bladder.  Empty your bladder often. Avoid holding urine for long periods of time.  Empty your bladder before and after sexual intercourse.  After a bowel movement, women should cleanse from front to back. Use each tissue only once. SEEK MEDICAL CARE IF:   You have back pain.  You develop a fever.  Your symptoms do not begin to resolve within 3 days. SEEK IMMEDIATE MEDICAL CARE IF:   You have severe back pain or lower abdominal pain.  You develop chills.  You have nausea or vomiting.  You have continued burning or discomfort with urination. MAKE SURE YOU:   Understand these instructions.  Will watch your condition.  Will get help right away if you are not doing well or get worse.   This information is not intended to replace advice given to you by your health care provider. Make sure you discuss any questions you have with your health care provider.   Document Released: 10/24/2004 Document Revised: 10/05/2014 Document Reviewed: 02/22/2011 Elsevier Interactive Patient Education 2016 [employer].    Emergency Department Resource Guide 1) Find a Doctor and Pay Out of Pocket Although you won't have to find out who is covered by your insurance plan, it is a good idea to ask around and  get recommendations. You will then need to call the office and see if the doctor you have chosen will accept you as a new patient and what types of options they offer for patients who are self-pay. Some doctors offer discounts or will set up payment plans for their patients who do not have insurance, but you will need to ask so you aren't surprised when you get to your appointment.  2) Contact Your Local Health Department Not all health departments have doctors that can see patients for sick visits, but many do, so it is worth a call to see if yours does. If you don't know where your local health department is, you can check in your phone book. The CDC also has a tool to help you locate your state's health department, and many state websites also have listings of all of their local health departments.  3) Find a Walk-in Clinic If your illness is not likely to be very severe or complicated, you may want to try a walk in clinic. These are popping up all over the country in pharmacies, drugstores, and shopping centers. They're usually staffed  by nurse practitioners or physician assistants that have been trained to treat common illnesses and complaints. They're usually fairly quick and inexpensive. However, if you have serious medical issues or chronic medical problems, these are probably not your best option.  No Primary Care Doctor: - Call Health Connect at  [phone] - they can help you locate a primary care doctor that  accepts your insurance, provides certain services, etc. - Physician Referral Service- [phone]  Chronic Pain Problems: Organization         Address  Phone   Notes  [facility]  [phone] Patients need to be referred by their primary care doctor.   Medication Assistance: Organization         Address  Phone   Notes  [facility] Medication [facility] [address] [place] [place], [place] [address] [phone] --Must be a resident of  [facility] -- Must have NO insurance coverage whatsoever (no Medicaid/ Medicare, etc.) -- The pt. MUST have a primary care doctor that directs their care regularly and follows them in the community   MedAssist  [phone]   [employer]  [phone]    Agencies that provide inexpensive medical care: Organization         Address  Phone   Notes  [facility]  [phone]   [facility]    [phone]   [facility] [address] [place], [place] [address] [phone]   [facility] [address] [place], [place] [phone]   Planned Parenthood    [phone]   [facility]    [phone]   [facility] and [facility]  [address], [place] Phone:  [phone], Fax:  [phone] Hours of Operation:  9 am - 6 pm, M-F.  Also accepts Medicaid/Medicare and self-pay.  [facility]  [address], [place] Phone: [phone], Fax: [phone]. Hours of Operation:  8:30 am - 5:30 pm, M-F.  Also accepts Medicaid and self-pay.  [facility] [place] [address], [place] Phone: [phone]   [facility] [address] Natasha Bence [place], [place] [phone], Ext. [phone] Mondays & Thursdays: 7-9 AM.  First 15 patients are seen on a first come, first serve basis.    Medicaid-accepting [facility] Providers:  Organization         Address  Phone   Notes  [facility] [address], [place] [phone] Also accepts self-pay patients.  [facility] [address] Laurell Josephs [place], [place]  [phone]   [facility] [address], [place] [phone]   [facility] [address], [place] [phone]   Renaye Rakers [address], [place]   [phone] Only accepts [place] Access  [place] patients after they have their name applied to their card.   Self-Pay (no insurance) in [facility]:  Organization         Address  Phone   Notes  Sickle Cell Patients, [facility] Internal Medicine [address] [place], [place] [phone]   [facility] [address] [place], [place] [phone]   [facility] [place]  [address], [place] [phone]   [facility]/Dr.  Osei-Bonsu  [address], [place] or [address], [place] [phone] Phone number for both [place] and [place] locations is the same.  Urgent Medical and [facility] [address] [place] [phone]   [facility] [address] [place] or [address] [phone]   [facility] [address], [place] [phone], phone; [phone], fax Sees patients 1st and 3rd Saturday of every month.  Must not qualify for public or private insurance (i.e. Medicaid, Medicare, [place] Health Choice, Veterans' Benefits)  Household income should be no more than 200% of the poverty level The clinic cannot treat you if you are pregnant or think you are pregnant  Sexually transmitted diseases are not treated at the clinic.    Dental Care: Organization         Address  Phone  Notes  [facility] [address] [place], [place] [phone] Accepts children up to age 74 who are enrolled in [place] or [place] Health Choice; pregnant women with a Medicaid card; and children who have applied for Medicaid or [place] Health Choice, but were declined, whose parents can pay a reduced fee at time of service.  [facility] Department of [facility]  [address], [place] [phone] Accepts children up to age 73 who are enrolled in [place] or [place] Health Choice; pregnant women with a Medicaid  card; and children who have applied for Medicaid or [place] Health Choice, but were declined, whose parents can pay a reduced fee at time of service.  [facility]  [address] [place], [place] [phone] Patients are seen by appointment only. Walk-ins are not accepted. [facility] will see patients 60 years of age and older. Monday - Tuesday (8am-5pm) Most Wednesdays (8:30-5pm) $30 per visit, cash only  [facility]  [address], [facility] [phone] Patients are seen by appointment only. Walk-ins are not accepted. [facility] will see patients 26 years of age and older. One Wednesday Evening (Monthly: Volunteer Based).  $30 per visit, cash only  [employer]  [phone] for adults; Children under age 47, call Graduate Pediatric Dentistry at [phone]. Children aged 61-14, please call [phone] to request a pediatric application.  Dental services are provided in all areas of dental care including fillings, crowns and bridges, complete and partial dentures, implants, gum treatment, root canals, and extractions. Preventive care is also provided. Treatment is provided to both adults and children. Patients are selected via a lottery and there is often a waiting list.   [facility] [address] [place]  [phone] [email or web address]   [facility] [address] [place], [place] [phone], Ext. [phone] Second and Fourth Thursday of each month, opens at 6:30 AM; Clinic ends at 9 AM.  Patients are seen on a first-come first-served basis, and a limited number are seen during each clinic.   [facility]  [address] Ether Griffins [place], [place] [phone]   Eligibility Requirements You must have lived in [place], [place], or [place] counties for at least the last three months.   You cannot be eligible for state or federal sponsored [place],  including [employer], [place], or [employer].   You generally cannot be eligible for healthcare insurance through your employer.  How to apply: Eligibility screenings are held every Tuesday and Wednesday afternoon from 1:00 pm until 4:00 pm. You do not need an appointment for the interview!  [facility] [address], [place], [place]   [place]  [place] Department  [place]  [phone]    Behavioral Health Resources in the Community: Intensive Outpatient Programs Organization         Address  Phone  Notes  [facility] [place]. [address], [place], [place] [phone]   [facility] Outpatient [address], [place], [place]   [facility]: [facility] [address], [place], [place]   [place] [address]  [place], [place] or [phone]   Substance Abuse Resources Organization         Address  Phone  Notes  [facility] Drug Services  [phone]   [place]  [phone]   The [place]   Chinita Pester  [phone]   Residential & Outpatient Substance Abuse Program  [phone]   Psychological Services Organization         Address  Phone  Notes  [facility] [place]  [place]  [phone]   [place] [address] [place] or [phone]    Mobile Crisis Teams Organization         Address  Phone  Notes  [facility], Mobile Crisis Care Unit  [phone]   [facility]  [address] [place], [place]   [place] Levels [address], [place] [phone]    Self-Help/Support Groups Organization         Address  Phone             Notes  [place].  of [place] - variety of support groups  [place] Call for more information  Narcotics Anonymous (NA), [facility] [address], [employer] [place]  2 meetings at this location   Special educational needs teacher         Address  Phone  Notes  [facility] [place],    [place]  [phone]   [facility]  [address], [place] [phone], [place], [place]   [place], [place] [phone] Admissions: 8am-3pm M-F  Incentives Substance [place] [phone]    [place], [place] [phone]   [facility] [address] [place], [place], [place]   [facility] [address]  [place], [place]   [facility] - Intensive Outpatient [address], [place], [place]   [facility] [address]  [place], [place] [phone] or [phone]   [facility] ([facility]) [address],  [place], [place] Accepts Medicaid  [facility] [address],  [place] [phone] Substance Abuse/Addiction Treatment   [facility] Organization         Address  Phone  Notes  [facility]  [phone]   Domenic Schwab, PhD [address] Arlis Porta [place], [place]   [phone] or [phone]   [place], [place] [phone]   [facility] [address],  [place], [place] [phone] Insurance/Medicaid/sponsorship through [facility] and Families [address], Ste [place], [place] [phone] [place] [address]   [place], [place] [phone]    Dr. Adele Schilder  [phone]   [facility] Dept. 1) [address] [place] 2)  [place] 3)  [place], [place] [phone]  [phone]   [place] [phone] or [phone] (After Hours)

## 2015-01-09 NOTE — ED Provider Notes (Signed)
CSN: [id number]     Arrival date & time 01/09/15  1478 History   First MD Initiated Contact with Patient 01/09/15 [phone]     Chief Complaint  Patient presents with  . Abdominal Pain     (Consider location/radiation/quality/duration/timing/severity/associated sxs/prior Treatment) HPI Comments: Patient with Hx Cirrhosis and chronic abdominal pain Now states having nausea and pain  That is worse than normal. She was seen 01/04/15 for same  Her LFT were abnormal but are always elevated as well as being persistently tachycardiac.   Patient is a 24 y.o. female presenting with abdominal pain. The history is provided by the patient.  Abdominal Pain Pain location:  Generalized Pain quality: aching   Pain radiation: generalized. Pain severity:  Moderate Onset quality:  Unable to specify Timing:  Constant Progression:  Worsening Chronicity:  Chronic Relieved by:  Nothing Worsened by:  Nothing tried Ineffective treatments:  None tried Associated symptoms: nausea   Associated symptoms: no anorexia, no constipation, no diarrhea, no dysuria, no fever, no flatus, no shortness of breath and no vomiting     Past Medical History  Diagnosis Date  . Anemia 2012  . Esophageal varices (HCC) 2015    s/p multiple EGDs and bandings dating to 2015.   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 GIB (gastrointestinal bleeding)   . Cirrhosis (HCC) 2003  . Irregular periods 08/04/2014  . Vaginal Pap smear, abnormal   . Hepatic encephalopathy (HCC)   . Coagulopathy (HCC)   . STD (female)     mutiple. risky sexual behavior.   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 Ascites 2003  . Psychosis    Past Surgical History  Procedure Laterality Date  . Gastric banding port revision    . Tonsillectomy    . Esophagogastroduodenoscopy  07/22/2011    Procedure: ESOPHAGOGASTRODUODENOSCOPY (EGD);  Surgeon: Graylin Shiver, MD;  Location: [facility] ENDOSCOPY;  Service:  Endoscopy;  Laterality: N/A;  . Esophagogastroduodenoscopy N/A 09/11/2014    Procedure: ESOPHAGOGASTRODUODENOSCOPY (EGD);  Surgeon: Charlott Rakes, MD;  Location: [facility] ENDOSCOPY;  Service: Endoscopy;  Laterality: N/A;   Family History  Problem Relation Age of Onset  . Adopted: Yes   Social History  Substance Use Topics  . Smoking status: Former Smoker -- 0.50 packs/day for 8 years    Types: Cigarettes  . Smokeless tobacco: Never Used  . Alcohol Use: No   OB History    Gravida Para Term Preterm AB TAB SAB Ectopic Multiple Living       Review of Systems  Constitutional: Negative for fever.  Respiratory: Negative for chest tightness and shortness of breath.   Gastrointestinal: Positive for nausea, abdominal pain and abdominal distention. Negative for vomiting, diarrhea, constipation, anorexia and flatus.  Genitourinary: Negative for dysuria.  Neurological: Negative for dizziness and headaches.  All other systems reviewed and are negative.     Allergies  Nsaids; Tylenol; and Amoxicillin  Home Medications   Prior to Admission medications   Medication Sig Start Date End Date Taking? Authorizing Provider  azaTHIOprine (IMURAN) 50 MG tablet Take 25 mg by mouth daily.    Yes Historical Provider, MD  carvedilol (COREG) 3.125 MG tablet Take 3.125 mg by mouth 2 (two) times daily with a meal.    Yes Historical Provider, MD  furosemide (LASIX) 20 MG tablet Take 2 tablets (40 mg total) by mouth daily. Patient taking differently:  Take 20 mg by mouth 2 (two) times daily.  10/12/14  Yes Shanker Levora DredgeM Ghimire, MD  lactulose (CHRONULAC) 10 GM/15ML solution Take 30 mLs (20 g total) by mouth 2 (two) times daily. Patient taking differently: Take 20 g by mouth 3 (three) times daily.  09/13/14  Yes Vilinda BlanksWilliam S Minor, NP  lidocaine (XYLOCAINE) 2 % solution Use as directed 15 mLs in the mouth or throat every 4 (four) hours as needed for mouth pain.   Yes Historical Provider, MD   metoCLOPramide (REGLAN) 5 MG tablet Take 5 mg by mouth 2 (two) times daily.    Yes Historical Provider, MD  omeprazole (PRILOSEC) 40 MG capsule Take 40 mg by mouth daily.   Yes Historical Provider, MD  predniSONE (DELTASONE) 5 MG tablet Take 5 mg by mouth daily with breakfast.   Yes Historical Provider, MD  spironolactone (ALDACTONE) 50 MG tablet Take 150 mg by mouth daily.   Yes Historical Provider, MD  ursodiol (ACTIGALL) 300 MG capsule Take 300 mg by mouth 2 (two) times daily.    Yes Historical Provider, MD  valGANciclovir (VALCYTE) 450 MG tablet Take 2 tablets (900 mg total) by mouth 2 (two) times daily. 10/12/14  Yes Shanker Levora DredgeM Ghimire, MD  nitrofurantoin, macrocrystal-monohydrate, (MACROBID) 100 MG capsule Take 1 capsule (100 mg total) by mouth 2 (two) times daily. Patient not taking: Reported on 01/04/2015 12/23/14   Maris BergerJonah Gunalda, MD  ondansetron (ZOFRAN ODT) 4 MG disintegrating tablet 4mg  ODT q4 hours prn nausea/vomit Patient not taking: Reported on 12/23/2014 11/17/14   Loren Raceravid Yelverton, MD  ondansetron (ZOFRAN ODT) 4 MG disintegrating tablet Take 1 tablet (4 mg total) by mouth every 8 (eight) hours as needed for nausea or vomiting. Patient not taking: Reported on 01/09/2015 12/23/14   Maris BergerJonah Gunalda, MD  ondansetron (ZOFRAN) 4 MG tablet Take 1 tablet (4 mg total) by mouth every 8 (eight) hours as needed for nausea or vomiting. Patient not taking: Reported on 11/23/2014 10/12/14   Maretta BeesShanker M Ghimire, MD  oxyCODONE (ROXICODONE) 5 MG immediate release tablet Take 1 tablet (5 mg total) by mouth every 4 (four) hours as needed for severe pain. Patient not taking: Reported on 12/23/2014 11/17/14   Loren Raceravid Yelverton, MD  rifaximin (XIFAXAN) 550 MG TABS tablet Take 1 tablet (550 mg total) by mouth 2 (two) times daily. Patient not taking: Reported on 12/23/2014 09/13/14   Vilinda BlanksWilliam S Minor, NP   BP 136/98 mmHg  Pulse 131  Temp(Src) 99.6 F (37.6 C) (Oral)  Resp 30  Ht 4\' 11"  (1.499 m)  Wt 53.978 kg   BMI 24.02 kg/m2  SpO2 96% Physical Exam  Constitutional: She appears well-developed and well-nourished.  HENT:  Head: Normocephalic.  Eyes: Pupils are equal, round, and reactive to light.  Neck: Normal range of motion.  Cardiovascular: Regular rhythm.  Tachycardia present.   Pulmonary/Chest: Effort normal.  Abdominal: She exhibits distension. There is generalized tenderness.  Musculoskeletal: Normal range of motion.  Nursing note and vitals reviewed.   ED Course  Procedures (including critical care time) Labs Review Labs Reviewed  LIPASE, BLOOD - Abnormal; Notable for the following:    Lipase 59 (*)    All other components within normal limits  COMPREHENSIVE METABOLIC PANEL - Abnormal; Notable for the following:    Sodium 124 (*)    Chloride 91 (*)    Glucose, Bld 144 (*)    Calcium 8.4 (*)    Albumin 3.3 (*)    AST 156 (*)  ALT 100 (*)    Alkaline Phosphatase 711 (*)    Total Bilirubin 10.0 (*)    All other components within normal limits  CBC - Abnormal; Notable for the following:    RBC 3.44 (*)    Hemoglobin 11.6 (*)    HCT 33.4 (*)    RDW 17.0 (*)    Platelets 105 (*)    All other components within normal limits  URINALYSIS, ROUTINE W REFLEX MICROSCOPIC (NOT AT [facility]) - Abnormal; Notable for the following:    Color, Urine ORANGE (*)    APPearance CLOUDY (*)    Bilirubin Urine LARGE (*)    Ketones, ur 15 (*)    Nitrite POSITIVE (*)    Leukocytes, UA SMALL (*)    All other components within normal limits  AMMONIA - Abnormal; Notable for the following:    Ammonia 120 (*)    All other components within normal limits  URINE MICROSCOPIC-ADD ON - Abnormal; Notable for the following:    Squamous Epithelial / LPF 6-30 (*)    Bacteria, UA FEW (*)    All other components within normal limits    Imaging Review No results found. I have personally reviewed and evaluated these images and lab results as part of my medical decision-making.   EKG  Interpretation None    patient had paracentesis about 2 weeks ago   MDM   Final diagnoses:  Chronic abdominal pain  Elevated liver function tests  UTI (lower urinary tract infection)         Earley Favor, NP 01/10/15 0534  April Palumbo, MD 01/10/15 [phone]

## 2015-01-09 NOTE — ED Provider Notes (Signed)
Received sign out from Earley FavorGail Schulz, NP, at change of shift.  Pt with chronic abdominal pain, chronic tachycardia, chronic LFT elevation, hx cirrhosis with last paracentesis 2 weeks ago p/w abdominal pain.  Found to have new UTI, which was treated with fosfomycin.  Plan is for pain control, discharge home.  7:38 AM I spoke with patient who continues to complain of abdominal pain that began last night.  Will redose dilaudid.  Pt also asking for ice chips.    8:39 AM Patient reports pain relief, feeling better, tolerating PO.  Plan for discharge.    Melanie Dredgemily Anysha Frappier, PA-C 01/09/15 [id number]  April Palumbo, MD 01/10/15 [phone]

## 2015-01-10 ENCOUNTER — Emergency Department (HOSPITAL_COMMUNITY)
Admission: EM | Admit: 2015-01-10 | Discharge: 2015-01-10 | Disposition: A | Discharge status: Home / Self Care | Payer: BC Managed Care – PPO | Attending: Emergency Medicine | Admitting: Emergency Medicine

## 2015-01-10 ENCOUNTER — Encounter (HOSPITAL_COMMUNITY): Payer: Self-pay | Admitting: Emergency Medicine

## 2015-01-10 DIAGNOSIS — G8929 Other chronic pain: Secondary | ICD-10-CM | POA: Diagnosis not present

## 2015-01-10 DIAGNOSIS — R531 Weakness: Secondary | ICD-10-CM | POA: Diagnosis not present

## 2015-01-10 DIAGNOSIS — Z862 Personal history of diseases of the blood and blood-forming organs and certain disorders involving the immune mechanism: Secondary | ICD-10-CM | POA: Diagnosis not present

## 2015-01-10 DIAGNOSIS — Z8619 Personal history of other infectious and parasitic diseases: Secondary | ICD-10-CM | POA: Insufficient documentation

## 2015-01-10 DIAGNOSIS — R140 Abdominal distension (gaseous): Secondary | ICD-10-CM | POA: Insufficient documentation

## 2015-01-10 DIAGNOSIS — Z8742 Personal history of other diseases of the female genital tract: Secondary | ICD-10-CM | POA: Diagnosis not present

## 2015-01-10 DIAGNOSIS — R110 Nausea: Secondary | ICD-10-CM | POA: Insufficient documentation

## 2015-01-10 DIAGNOSIS — Z9889 Other specified postprocedural states: Secondary | ICD-10-CM | POA: Insufficient documentation

## 2015-01-10 DIAGNOSIS — Z7952 Long term (current) use of systemic steroids: Secondary | ICD-10-CM | POA: Diagnosis not present

## 2015-01-10 DIAGNOSIS — Z8659 Personal history of other mental and behavioral disorders: Secondary | ICD-10-CM | POA: Diagnosis not present

## 2015-01-10 DIAGNOSIS — Z880 Allergy status to penicillin: Secondary | ICD-10-CM | POA: Insufficient documentation

## 2015-01-10 DIAGNOSIS — R1084 Generalized abdominal pain: Secondary | ICD-10-CM | POA: Insufficient documentation

## 2015-01-10 DIAGNOSIS — R000 Tachycardia, unspecified: Secondary | ICD-10-CM | POA: Diagnosis not present

## 2015-01-10 DIAGNOSIS — Z8719 Personal history of other diseases of the digestive system: Secondary | ICD-10-CM | POA: Insufficient documentation

## 2015-01-10 DIAGNOSIS — R109 Unspecified abdominal pain: Secondary | ICD-10-CM | POA: Diagnosis present

## 2015-01-10 MED ORDER — HYDROMORPHONE HCL 1 MG/ML IJ SOLN
1.0000 mg | Freq: Once | INTRAMUSCULAR | Status: AC
  Administered 2015-01-10: 1 mg via INTRAMUSCULAR
  Filled 2015-01-10: qty 1

## 2015-01-10 MED ORDER — ONDANSETRON 4 MG PO TBDP
8.0000 mg | ORAL_TABLET | Freq: Once | ORAL | Status: AC
  Administered 2015-01-10: 8 mg via ORAL
  Filled 2015-01-10: qty 2

## 2015-01-10 NOTE — ED Notes (Signed)
Pt from home via [facility] with c/o generalized sharp stabbing throbbing abdominal pain x 1 week.  Pt was calm and talking clearly until arrival to the ED and then began crying hysterically.  NAD, A&O.

## 2015-01-10 NOTE — ED Notes (Signed)
Discussed pt with Dr Jeraldine LootsLockwood.  Will wait for providers orders instead of protocols.

## 2015-01-10 NOTE — ED Provider Notes (Signed)
CSN: [id number]     Arrival date & time 01/10/15  1547 History   First MD Initiated Contact with Patient 01/10/15 1604     Chief Complaint  Patient presents with  . Abdominal Pain     (Consider location/radiation/quality/duration/timing/severity/associated sxs/prior Treatment) HPI This young female with chronic abdominal pain presents with recurrent episode of pain. Pain is diffuse, has been present for long time, but this episode began several days ago. Patient was seen here within the past 48 hours for the pain, evaluated, discharged in stable condition to [facility] gastrin [facility]. She has an appointment tomorrow for paracentesis. She has a notable history of autoimmune hepatitis, receives paracentesis approximately every 2 weeks. She states that she has no medication at home for pain control. The pain is characteristically the same as it always is, diffuse, sore, severe, without other new concerns, including no new fever, chills, diarrhea, vomiting.  Past Medical History  Diagnosis Date  . Anemia 2012  . Esophageal varices (HCC) 2015    s/p multiple EGDs and bandings dating to 2015.   Marland Kitchen. 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 GIB (gastrointestinal bleeding)   . Cirrhosis (HCC) 2003  . Irregular periods 08/04/2014  . Vaginal Pap smear, abnormal   . Hepatic encephalopathy (HCC)   . Coagulopathy (HCC)   . STD (female)     mutiple. risky sexual behavior.   Marland Kitchen. 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 Ascites 2003  . Psychosis    Past Surgical History  Procedure Laterality Date  . Gastric banding port revision    . Tonsillectomy    . Esophagogastroduodenoscopy  07/22/2011    Procedure: ESOPHAGOGASTRODUODENOSCOPY (EGD);  Surgeon: Graylin ShiverSalem F Ganem, MD;  [facility];  Service: Endoscopy;  Laterality: N/A;  . Esophagogastroduodenoscopy N/A 09/11/2014    Procedure: ESOPHAGOGASTRODUODENOSCOPY (EGD);  Surgeon:  Charlott RakesVincent Schooler, MD;  [facility];  Service: Endoscopy;  Laterality: N/A;   Family History  Problem Relation Age of Onset  . Adopted: Yes   Social History  Substance Use Topics  . Smoking status: Former Smoker -- 0.50 packs/day for 8 years    Types: Cigarettes  . Smokeless tobacco: Never Used  . Alcohol Use: No   OB History    Gravida Para Term Preterm AB TAB SAB Ectopic Multiple Living   0 0 0 0 0 0 0 0 0 0      Review of Systems  Constitutional: Negative for fever.  Respiratory: Negative for chest tightness and shortness of breath.   Gastrointestinal: Positive for nausea, abdominal pain and abdominal distention. Negative for vomiting, diarrhea and constipation.  Genitourinary: Negative for dysuria.  Neurological: Positive for weakness. Negative for dizziness and headaches.  All other systems reviewed and are negative.     Allergies  Nsaids; Tylenol; and Amoxicillin  Home Medications   Prior to Admission medications   Medication Sig Start Date End Date Taking? Authorizing Provider  azaTHIOprine (IMURAN) 50 MG tablet Take 25 mg by mouth daily.     Historical Provider, MD  carvedilol (COREG) 3.125 MG tablet Take 3.125 mg by mouth 2 (two) times daily with a meal.     Historical Provider, MD  furosemide (LASIX) 20 MG tablet Take 2 tablets (40 mg total) by mouth daily. Patient taking differently: Take 20 mg by mouth 2 (two) times daily.  10/12/14   Shanker Levora DredgeM Ghimire, MD  lactulose (CHRONULAC) 10 GM/15ML solution Take 30 mLs (  20 g total) by mouth 2 (two) times daily. Patient taking differently: Take 20 g by mouth 3 (three) times daily.  09/13/14   Vilinda Blanks Minor, NP  lidocaine (XYLOCAINE) 2 % solution Use as directed 15 mLs in the mouth or throat every 4 (four) hours as needed for mouth pain.    Historical Provider, MD  metoCLOPramide (REGLAN) 5 MG tablet Take 5 mg by mouth 2 (two) times daily.     Historical Provider, MD  nitrofurantoin, macrocrystal-monohydrate,  (MACROBID) 100 MG capsule Take 1 capsule (100 mg total) by mouth 2 (two) times daily. Patient not taking: Reported on 01/04/2015 12/23/14   Maris Berger, MD  omeprazole (PRILOSEC) 40 MG capsule Take 40 mg by mouth daily.    Historical Provider, MD  ondansetron (ZOFRAN ODT) 4 MG disintegrating tablet  ODT q4 hours prn nausea/vomit Patient not taking: Reported on 12/23/2014 11/17/14   Loren Racer, MD  ondansetron (ZOFRAN ODT) 4 MG disintegrating tablet Take 1 tablet (4 mg total) by mouth every 8 (eight) hours as needed for nausea or vomiting. Patient not taking: Reported on 01/09/2015 12/23/14   Maris Berger, MD  ondansetron (ZOFRAN) 4 MG tablet Take 1 tablet (4 mg total) by mouth every 8 (eight) hours as needed for nausea or vomiting. Patient not taking: Reported on 11/23/2014 10/12/14   Maretta Bees, MD  oxyCODONE (ROXICODONE) 5 MG immediate release tablet Take 1 tablet (5 mg total) by mouth every 4 (four) hours as needed for severe pain. Patient not taking: Reported on 12/23/2014 11/17/14   Loren Racer, MD  predniSONE (DELTASONE) 5 MG tablet Take 5 mg by mouth daily with breakfast.    Historical Provider, MD  rifaximin (XIFAXAN) 550 MG TABS tablet Take 1 tablet (550 mg total) by mouth 2 (two) times daily. Patient not taking: Reported on 12/23/2014 09/13/14   Vilinda Blanks Minor, NP  spironolactone (ALDACTONE) 50 MG tablet Take 150 mg by mouth daily.    Historical Provider, MD  ursodiol (ACTIGALL) 300 MG capsule Take 300 mg by mouth 2 (two) times daily.     Historical Provider, MD  valGANciclovir (VALCYTE) 450 MG tablet Take 2 tablets (900 mg total) by mouth 2 (two) times daily. 10/12/14   Shanker Levora Dredge, MD   BP 116/81 mmHg  Pulse 124  Temp(Src) 98.9 F (37.2 C) (Oral)  Resp 18  Wt 120 lb (54.432 kg)  SpO2 98% Physical Exam  Constitutional: She is oriented to person, place, and time. She appears well-developed and well-nourished. No distress.  HENT:  Head: Normocephalic and  atraumatic.  Mouth/Throat: Oropharynx is clear and moist.  Eyes: EOM are normal. Pupils are equal, round, and reactive to light.  Neck: Neck supple. No JVD present.  Cardiovascular: Regular rhythm, normal heart sounds and intact distal pulses.  Tachycardia present.  Exam reveals no gallop.   No murmur heard. Pulmonary/Chest: Effort normal and breath sounds normal. She has no wheezes. She has no rales.  Abdominal: Soft. She exhibits no distension. There is tenderness in the epigastric area. There is no rigidity, no rebound and no guarding.  Musculoskeletal: Normal range of motion. She exhibits no tenderness.  Neurological: She is alert and oriented to person, place, and time. No cranial nerve deficit. She exhibits normal muscle tone.  Skin: Skin is warm and dry. No rash noted.  Psychiatric: Her behavior is normal.    ED Course  Procedures (including critical care time)  Cardiac monitor 121 sinus tach abnormal Pulse ox symmetry 99% room  air normal I reviewed the patient's chart, including documentation for 14 visits within the past 6 months. Patient had labs performed within the past 48 hours, has history of chronically elevated LFT.   MDM  This young female with autoimmune hepatitis presents with ongoing abdominal pain. The patient is a tender abdomen, there is no fever, no confusion, low suspicion for peritonitis. Labs performed in the past few days, consistent with prior evaluations. Patient has scheduled gastroenterology follow-up tomorrow with her specialist at [facility], with consideration of paracentesis. Patient had analgesia provided here, and with reduction in symptoms, was stable for discharge with outpatient follow-up tomorrow.  Gerhard Munch, MD 01/10/15 [phone]

## 2015-01-10 NOTE — Discharge Instructions (Signed)
As discussed, it is important that you follow tomorrow with your physician for continued management of your condition.  If you develop any new, or concerning changes in your condition, please return to the emergency department immediately.

## 2015-01-11 ENCOUNTER — Ambulatory Visit
Admission: RE | Admit: 2015-01-11 | Discharge: 2015-01-11 | Disposition: A | Discharge status: Home / Self Care | Payer: BC Managed Care – PPO | Source: Ambulatory Visit | Attending: Internal Medicine | Admitting: Internal Medicine

## 2015-01-11 DIAGNOSIS — R188 Other ascites: Secondary | ICD-10-CM | POA: Diagnosis not present

## 2015-01-11 MED ORDER — ALBUMIN HUMAN 25 % IV SOLN
25.0000 g | Freq: Once | INTRAVENOUS | Status: AC
  Administered 2015-01-11: 25 g via INTRAVENOUS
  Filled 2015-01-11: qty 100

## 2015-01-11 NOTE — Procedures (Signed)
US guided paracentesis.  Removed 5.5 liters without complication.

## 2015-01-11 NOTE — Discharge Instructions (Signed)
Paracentesis, Care After °Refer to this sheet in the next few weeks. These instructions provide you with information about caring for yourself after your procedure. Your health care provider may also give you more specific instructions. Your treatment has been planned according to current medical practices, but problems sometimes occur. Call your health care provider if you have any problems or questions after your procedure. °WHAT TO EXPECT AFTER THE PROCEDURE °After your procedure, it is common to have a small amount of clear fluid coming from the puncture site. °HOME CARE INSTRUCTIONS °· Return to your normal activities as told by your health care provider. Ask your health care provider what activities are safe for you. °· Take over-the-counter and prescription medicines only as told by your health care provider. °· Do not take baths, swim, or use a hot tub until your health care provider approves. °· Follow instructions from your health care provider about: °¨ How to take care of your puncture site. °¨ When and how you should change your bandage (dressing). °¨ When you should remove your dressing. °· Check your puncture area every day signs of infection. Watch for: °¨ Redness, swelling, or pain. °¨ Fluid, blood, or pus. °· Keep all follow-up visits as told by your health care provider. This is important. °SEEK MEDICAL CARE IF: °· You have redness, swelling, or pain at your puncture site. °· You start to have more clear fluid coming from your puncture site. °· You have blood or pus coming from your puncture site. °· You have chills. °· You have a fever. °SEEK IMMEDIATE MEDICAL CARE IF: °· You develop chest pain or shortness of breath. °· You develop increasing pain, discomfort, or swelling in your abdomen. °· You feel dizzy or light-headed or you pass out. °  °This information is not intended to replace advice given to you by your health care provider. Make sure you discuss any questions you have with your health  care provider. °  °Document Released: 05/31/2014 Document Reviewed: 05/31/2014 °Elsevier Interactive Patient Education ©2016 [employer] ° °

## 2015-01-15 LAB — BODY FLUID CULTURE: Culture: NO GROWTH

## 2015-01-17 ENCOUNTER — Emergency Department (HOSPITAL_COMMUNITY): Payer: BC Managed Care – PPO

## 2015-01-17 ENCOUNTER — Inpatient Hospital Stay (HOSPITAL_COMMUNITY)
Admission: EM | Admit: 2015-01-17 | Discharge: 2015-01-18 | DRG: 441 | Disposition: A | Discharge status: Home / Self Care | Payer: BC Managed Care – PPO | Attending: Internal Medicine | Admitting: Internal Medicine

## 2015-01-17 ENCOUNTER — Encounter (HOSPITAL_COMMUNITY): Payer: Self-pay | Admitting: Emergency Medicine

## 2015-01-17 DIAGNOSIS — B259 Cytomegaloviral disease, unspecified: Secondary | ICD-10-CM | POA: Diagnosis present

## 2015-01-17 DIAGNOSIS — R188 Other ascites: Secondary | ICD-10-CM

## 2015-01-17 DIAGNOSIS — K55069 Acute infarction of intestine, part and extent unspecified: Secondary | ICD-10-CM | POA: Diagnosis present

## 2015-01-17 DIAGNOSIS — D684 Acquired coagulation factor deficiency: Secondary | ICD-10-CM | POA: Diagnosis present

## 2015-01-17 DIAGNOSIS — R1084 Generalized abdominal pain: Secondary | ICD-10-CM

## 2015-01-17 DIAGNOSIS — K7460 Unspecified cirrhosis of liver: Secondary | ICD-10-CM | POA: Diagnosis present

## 2015-01-17 DIAGNOSIS — R140 Abdominal distension (gaseous): Secondary | ICD-10-CM

## 2015-01-17 DIAGNOSIS — IMO0002 Reserved for concepts with insufficient information to code with codable children: Secondary | ICD-10-CM

## 2015-01-17 DIAGNOSIS — Z9119 Patient's noncompliance with other medical treatment and regimen: Secondary | ICD-10-CM

## 2015-01-17 DIAGNOSIS — E861 Hypovolemia: Secondary | ICD-10-CM | POA: Diagnosis present

## 2015-01-17 DIAGNOSIS — D689 Coagulation defect, unspecified: Secondary | ICD-10-CM | POA: Diagnosis present

## 2015-01-17 DIAGNOSIS — D649 Anemia, unspecified: Secondary | ICD-10-CM | POA: Diagnosis present

## 2015-01-17 DIAGNOSIS — K766 Portal hypertension: Secondary | ICD-10-CM | POA: Diagnosis present

## 2015-01-17 DIAGNOSIS — K429 Umbilical hernia without obstruction or gangrene: Secondary | ICD-10-CM | POA: Diagnosis present

## 2015-01-17 DIAGNOSIS — Z681 Body mass index (BMI) 19 or less, adult: Secondary | ICD-10-CM

## 2015-01-17 DIAGNOSIS — E43 Unspecified severe protein-calorie malnutrition: Secondary | ICD-10-CM | POA: Diagnosis present

## 2015-01-17 DIAGNOSIS — K754 Autoimmune hepatitis: Secondary | ICD-10-CM | POA: Diagnosis present

## 2015-01-17 DIAGNOSIS — E876 Hypokalemia: Secondary | ICD-10-CM | POA: Diagnosis present

## 2015-01-17 DIAGNOSIS — D61818 Other pancytopenia: Secondary | ICD-10-CM | POA: Diagnosis not present

## 2015-01-17 DIAGNOSIS — K830 Cholangitis: Secondary | ICD-10-CM | POA: Diagnosis present

## 2015-01-17 DIAGNOSIS — D696 Thrombocytopenia, unspecified: Secondary | ICD-10-CM | POA: Diagnosis present

## 2015-01-17 DIAGNOSIS — K7290 Hepatic failure, unspecified without coma: Secondary | ICD-10-CM | POA: Diagnosis present

## 2015-01-17 DIAGNOSIS — E8809 Other disorders of plasma-protein metabolism, not elsewhere classified: Secondary | ICD-10-CM | POA: Diagnosis present

## 2015-01-17 DIAGNOSIS — I8510 Secondary esophageal varices without bleeding: Secondary | ICD-10-CM | POA: Diagnosis present

## 2015-01-17 DIAGNOSIS — Z79899 Other long term (current) drug therapy: Secondary | ICD-10-CM

## 2015-01-17 DIAGNOSIS — E871 Hypo-osmolality and hyponatremia: Secondary | ICD-10-CM | POA: Diagnosis present

## 2015-01-17 DIAGNOSIS — R109 Unspecified abdominal pain: Secondary | ICD-10-CM | POA: Diagnosis present

## 2015-01-17 DIAGNOSIS — I81 Portal vein thrombosis: Secondary | ICD-10-CM | POA: Diagnosis present

## 2015-01-17 DIAGNOSIS — E860 Dehydration: Secondary | ICD-10-CM | POA: Diagnosis present

## 2015-01-17 DIAGNOSIS — Z87891 Personal history of nicotine dependence: Secondary | ICD-10-CM | POA: Diagnosis not present

## 2015-01-17 DIAGNOSIS — K8301 Primary sclerosing cholangitis: Secondary | ICD-10-CM | POA: Diagnosis present

## 2015-01-17 DIAGNOSIS — R17 Unspecified jaundice: Secondary | ICD-10-CM | POA: Diagnosis present

## 2015-01-17 LAB — COMPREHENSIVE METABOLIC PANEL
ALBUMIN: 2.7 g/dL — AB (ref 3.5–5.0)
ALK PHOS: 395 U/L — AB (ref 38–126)
ALT: 31 U/L (ref 14–54)
AST: 44 U/L — AB (ref 15–41)
Anion gap: 10 (ref 5–15)
BUN: 7 mg/dL (ref 6–20)
CALCIUM: 8 mg/dL — AB (ref 8.9–10.3)
CHLORIDE: 89 mmol/L — AB (ref 101–111)
CO2: 25 mmol/L (ref 22–32)
CREATININE: 0.77 mg/dL (ref 0.44–1.00)
GFR calc Af Amer: >60 mL/min (ref >60)
GFR calc non Af Amer: >60 mL/min (ref >60)
GLUCOSE: 117 mg/dL — AB (ref 65–99)
Potassium: 2.7 mmol/L — CL (ref 3.5–5.1)
SODIUM: 124 mmol/L — AB (ref 135–145)
Total Bilirubin: 7 mg/dL — ABNORMAL HIGH (ref 0.3–1.2)
Total Protein: 5.5 g/dL — ABNORMAL LOW (ref 6.5–8.1)

## 2015-01-17 LAB — PROTIME-INR
INR: 1.68 — ABNORMAL HIGH (ref 0.00–1.49)
PROTHROMBIN TIME: 19.8 s — AB (ref 11.6–15.2)

## 2015-01-17 LAB — BODY FLUID CELL COUNT WITH DIFFERENTIAL
Eos, Fluid: 0 %
LYMPHS FL: 14 %
Monocyte-Macrophage-Serous Fluid: 85 % (ref 50–90)
NEUTROPHIL FLUID: 1 % (ref 0–25)
WBC FLUID: 34 uL (ref 0–1000)

## 2015-01-17 LAB — CBC WITH DIFFERENTIAL/PLATELET
BASOS ABS: 0 10*3/uL (ref 0.0–0.1)
BASOS PCT: 0 %
EOS ABS: 0 10*3/uL (ref 0.0–0.7)
EOS PCT: 0 %
HCT: 28.9 % — ABNORMAL LOW (ref 36.0–46.0)
HEMOGLOBIN: 10 g/dL — AB (ref 12.0–15.0)
LYMPHS ABS: 0.8 10*3/uL (ref 0.7–4.0)
Lymphocytes Relative: 14 %
MCH: 34.1 pg — ABNORMAL HIGH (ref 26.0–34.0)
MCHC: 34.6 g/dL (ref 30.0–36.0)
MCV: 98.6 fL (ref 78.0–100.0)
Monocytes Absolute: 1.6 10*3/uL — ABNORMAL HIGH (ref 0.1–1.0)
Monocytes Relative: 28 %
NEUTROS PCT: 58 %
Neutro Abs: 3.3 10*3/uL (ref 1.7–7.7)
PLATELETS: 127 10*3/uL — AB (ref 150–400)
RBC: 2.93 MIL/uL — AB (ref 3.87–5.11)
RDW: 16.8 % — ABNORMAL HIGH (ref 11.5–15.5)
WBC: 5.7 10*3/uL (ref 4.0–10.5)

## 2015-01-17 LAB — URINALYSIS, ROUTINE W REFLEX MICROSCOPIC
GLUCOSE, UA: NEGATIVE mg/dL
HGB URINE DIPSTICK: NEGATIVE
Ketones, ur: NEGATIVE mg/dL
Nitrite: NEGATIVE
PROTEIN: NEGATIVE mg/dL
SPECIFIC GRAVITY, URINE: 1.019 (ref 1.005–1.030)
pH: 6.5 (ref 5.0–8.0)

## 2015-01-17 LAB — URINE MICROSCOPIC-ADD ON

## 2015-01-17 LAB — GRAM STAIN

## 2015-01-17 LAB — GLUCOSE, SEROUS FLUID: Glucose, Fluid: 124 mg/dL

## 2015-01-17 LAB — I-STAT BETA HCG BLOOD, ED (MC, WL, AP ONLY): I-stat hCG, quantitative: <5 m[IU]/mL (ref <5)

## 2015-01-17 LAB — CBG MONITORING, ED: GLUCOSE-CAPILLARY: 123 mg/dL — AB (ref 65–99)

## 2015-01-17 LAB — I-STAT CG4 LACTIC ACID, ED: LACTIC ACID, VENOUS: 2.25 mmol/L — AB (ref 0.5–2.0)

## 2015-01-17 LAB — PREGNANCY, URINE: Preg Test, Ur: NEGATIVE

## 2015-01-17 LAB — LIPASE, BLOOD: LIPASE: 61 U/L — AB (ref 11–51)

## 2015-01-17 MED ORDER — ONDANSETRON HCL 4 MG/2ML IJ SOLN
4.0000 mg | Freq: Four times a day (QID) | INTRAMUSCULAR | Status: DC | PRN
  Administered 2015-01-18: 4 mg via INTRAVENOUS
  Filled 2015-01-17: qty 2

## 2015-01-17 MED ORDER — LACTULOSE 10 GM/15ML PO SOLN
20.0000 g | Freq: Three times a day (TID) | ORAL | Status: DC
  Administered 2015-01-17 – 2015-01-18 (×2): 20 g via ORAL
  Filled 2015-01-17 (×2): qty 30

## 2015-01-17 MED ORDER — FUROSEMIDE 20 MG PO TABS
20.0000 mg | ORAL_TABLET | Freq: Two times a day (BID) | ORAL | Status: DC
  Administered 2015-01-17 – 2015-01-18 (×2): 20 mg via ORAL
  Filled 2015-01-17 (×2): qty 1

## 2015-01-17 MED ORDER — POTASSIUM CHLORIDE CRYS ER 20 MEQ PO TBCR
40.0000 meq | EXTENDED_RELEASE_TABLET | Freq: Once | ORAL | Status: AC
  Administered 2015-01-17: 40 meq via ORAL
  Filled 2015-01-17: qty 2

## 2015-01-17 MED ORDER — HYDROMORPHONE HCL 1 MG/ML IJ SOLN
1.0000 mg | Freq: Once | INTRAMUSCULAR | Status: AC
  Administered 2015-01-17: 1 mg via INTRAVENOUS
  Filled 2015-01-17: qty 1

## 2015-01-17 MED ORDER — SODIUM CHLORIDE 0.9 % IV BOLUS (SEPSIS)
2000.0000 mL | Freq: Once | INTRAVENOUS | Status: AC
  Administered 2015-01-17: 2000 mL via INTRAVENOUS

## 2015-01-17 MED ORDER — LIDOCAINE HCL 2 % IJ SOLN
10.0000 mL | Freq: Once | INTRAMUSCULAR | Status: AC
  Administered 2015-01-17: 200 mg via INTRADERMAL
  Filled 2015-01-17: qty 20

## 2015-01-17 MED ORDER — DEXTROSE 5 % IV SOLN
2.0000 g | Freq: Once | INTRAVENOUS | Status: AC
  Administered 2015-01-17: 2 g via INTRAVENOUS
  Filled 2015-01-17: qty 2

## 2015-01-17 MED ORDER — HYDROMORPHONE HCL 1 MG/ML IJ SOLN
0.5000 mg | INTRAMUSCULAR | Status: DC | PRN
  Administered 2015-01-17 – 2015-01-18 (×5): 1 mg via INTRAVENOUS
  Filled 2015-01-17 (×5): qty 1

## 2015-01-17 MED ORDER — SODIUM CHLORIDE 0.9 % IJ SOLN
3.0000 mL | Freq: Two times a day (BID) | INTRAMUSCULAR | Status: DC
  Administered 2015-01-18 (×2): 3 mL via INTRAVENOUS

## 2015-01-17 MED ORDER — LIDOCAINE VISCOUS 2 % MT SOLN: 15.0000 mL | OROMUCOSAL | Status: DC | PRN

## 2015-01-17 MED ORDER — FENTANYL CITRATE (PF) 100 MCG/2ML IJ SOLN
50.0000 ug | Freq: Once | INTRAMUSCULAR | Status: AC
  Administered 2015-01-17: 50 ug via INTRAVENOUS
  Filled 2015-01-17: qty 2

## 2015-01-17 MED ORDER — AZATHIOPRINE 50 MG PO TABS
25.0000 mg | ORAL_TABLET | Freq: Every day | ORAL | Status: DC
  Administered 2015-01-18: 25 mg via ORAL
  Filled 2015-01-17: qty 1

## 2015-01-17 MED ORDER — POTASSIUM CHLORIDE CRYS ER 20 MEQ PO TBCR: 40.0000 meq | EXTENDED_RELEASE_TABLET | Freq: Once | ORAL | Status: AC

## 2015-01-17 MED ORDER — METOCLOPRAMIDE HCL 5 MG PO TABS
5.0000 mg | ORAL_TABLET | Freq: Two times a day (BID) | ORAL | Status: DC
  Administered 2015-01-17 – 2015-01-18 (×2): 5 mg via ORAL
  Filled 2015-01-17 (×2): qty 1

## 2015-01-17 MED ORDER — SULFAMETHOXAZOLE-TRIMETHOPRIM 800-160 MG PO TABS
1.0000 | ORAL_TABLET | Freq: Two times a day (BID) | ORAL | Status: DC
  Administered 2015-01-17 – 2015-01-18 (×2): 1 via ORAL
  Filled 2015-01-17 (×2): qty 1

## 2015-01-17 MED ORDER — IOHEXOL 300 MG/ML  SOLN
70.0000 mL | Freq: Once | INTRAMUSCULAR | Status: AC | PRN
  Administered 2015-01-17: 70 mL via INTRAVENOUS

## 2015-01-17 MED ORDER — CETYLPYRIDINIUM CHLORIDE 0.05 % MT LIQD
7.0000 mL | Freq: Two times a day (BID) | OROMUCOSAL | Status: DC
  Administered 2015-01-17 – 2015-01-18 (×2): 7 mL via OROMUCOSAL

## 2015-01-17 MED ORDER — ONDANSETRON HCL 4 MG/2ML IJ SOLN
INTRAMUSCULAR | Status: AC
  Filled 2015-01-17: qty 2

## 2015-01-17 MED ORDER — CARVEDILOL 3.125 MG PO TABS
3.1250 mg | ORAL_TABLET | Freq: Two times a day (BID) | ORAL | Status: DC
  Administered 2015-01-18: 3.125 mg via ORAL
  Filled 2015-01-17: qty 1

## 2015-01-17 MED ORDER — VALGANCICLOVIR HCL 450 MG PO TABS
900.0000 mg | ORAL_TABLET | Freq: Two times a day (BID) | ORAL | Status: DC
  Administered 2015-01-17 – 2015-01-18 (×2): 900 mg via ORAL
  Filled 2015-01-17 (×3): qty 2

## 2015-01-17 MED ORDER — POTASSIUM CHLORIDE 10 MEQ/100ML IV SOLN
10.0000 meq | INTRAVENOUS | Status: AC
  Administered 2015-01-17 (×2): 10 meq via INTRAVENOUS
  Filled 2015-01-17 (×2): qty 100

## 2015-01-17 MED ORDER — PREDNISONE 5 MG PO TABS
5.0000 mg | ORAL_TABLET | Freq: Every day | ORAL | Status: DC
  Administered 2015-01-18: 5 mg via ORAL
  Filled 2015-01-17: qty 1

## 2015-01-17 MED ORDER — SPIRONOLACTONE 50 MG PO TABS
150.0000 mg | ORAL_TABLET | Freq: Every day | ORAL | Status: DC
  Administered 2015-01-17 – 2015-01-18 (×2): 150 mg via ORAL
  Filled 2015-01-17 (×2): qty 1

## 2015-01-17 MED ORDER — PANTOPRAZOLE SODIUM 40 MG PO TBEC
40.0000 mg | DELAYED_RELEASE_TABLET | Freq: Every day | ORAL | Status: DC
  Administered 2015-01-17 – 2015-01-18 (×2): 40 mg via ORAL
  Filled 2015-01-17 (×2): qty 1

## 2015-01-17 MED ORDER — FENTANYL CITRATE (PF) 100 MCG/2ML IJ SOLN
50.0000 ug | INTRAMUSCULAR | Status: DC | PRN
  Administered 2015-01-17: 50 ug via INTRAVENOUS
  Filled 2015-01-17: qty 2

## 2015-01-17 MED ORDER — URSODIOL 300 MG PO CAPS
300.0000 mg | ORAL_CAPSULE | Freq: Two times a day (BID) | ORAL | Status: DC
  Administered 2015-01-17: 300 mg via ORAL
  Filled 2015-01-17 (×4): qty 1

## 2015-01-17 NOTE — ED Notes (Signed)
Spoke with Micro about patient's cultures.

## 2015-01-17 NOTE — Consult Note (Signed)
[facility] CONSULT Reason for consult: Abdominal Pain  Referring Physician: [facility] hospitalist. Primary G.I.: Dr. Drue Novel in [place] in the hepatology Department  Melanie Conley is an 24 y.o. female.  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 She has had PSC/PBC variant and has been on medication for some time. She has developed cirrhosis and esophageal varices and has undergone several variceal banding procedures in [place]. She was referred To Dr. Drue Novel at the [facility] transplant center and has been undergoing evaluation there. She has developed chronic ascites and Dr. Drue Novel has arrange for therapeutic paracentesis last on 5 or 6 days ago with a normal WBC count on the ascitic fluid. She actually has a follow-up appointment for EGD and 2 days at [facility]. She had fairly sudden onset over several hours of upper abdominal pain and came to the emergency room with complaints of this pain. She was found to be hyponatremia can hypokalemia can. Her total bilirubin 7. CT scan showed nonocclusive SMV thrombosis. Repeat paracentesis was performed and there were only 34 WBCs in the ascitic fluid. Only one neutrophil. Lipase was slightly elevated 61 total bilirubin 7 only slight elevation transaminases.  I have discussed her case with the hepatology is on call [facility], Dr. Ahmed Prima. The patient apparently is likely to be turned down for liver transplantation due to borderline IQ and very poor social situation in the long history of noncompliance. He was not clear if she actually knew this at this time. I reviewed films with the radiologists here and he reviewed the film reports from [facility] and she apparently has had intermittent small thrombi been noninclusive in the past that apparently have not progressed to extensive occlusive thrombosis. his recommendation given her known varices and nonocclusive thrombi was not to anticoagulated her. He recommended treating  her with antibiotics and symptomatic treatment. She has been on Imuran and Coreg as well as metoclopramide Chronulac ursodiol and Septra DS on a chronic basis. She also has been on Valcyte for suppression for CMV and Xifaxan for chronic encephalopathy. Apparently her compliance with all these medications in the past is been questionable . Past Medical History  Diagnosis Date  . Anemia 2012  . Esophageal varices ([place]) 2015    s/p multiple EGDs and bandings dating to 2015.   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 GIB (gastrointestinal bleeding)   . Cirrhosis ([place]) 2003  . Irregular periods 08/04/2014  . Vaginal Pap smear, abnormal   . Hepatic encephalopathy ([place])   . Coagulopathy ([place])   . STD (female)     mutiple. risky sexual behavior.   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 Ascites 2003  . Psychosis     Past Surgical History  Procedure Laterality Date  . Gastric banding port revision    . Tonsillectomy    . Esophagogastroduodenoscopy  07/22/2011    Procedure: ESOPHAGOGASTRODUODENOSCOPY (EGD);  Surgeon: Wonda Horner, MD;  Location: [facility] ENDOSCOPY;  Service: Endoscopy;  Laterality: N/A;  . Esophagogastroduodenoscopy N/A 09/11/2014    Procedure: ESOPHAGOGASTRODUODENOSCOPY (EGD);  Surgeon: Wilford Corner, MD;  Location: [facility] ENDOSCOPY;  Service: Endoscopy;  Laterality: N/A;    Family History  Problem Relation Age of Onset  . Adopted: Yes    Social History:  reports that she has quit smoking. Her smoking use included Cigarettes. She has a 4 pack-year smoking history. She has never used smokeless tobacco. She  reports that she does not drink alcohol or use illicit drugs.  Allergies:  Allergies  Allergen Reactions  . Nsaids     Can not take because of Liver  . Tylenol [Acetaminophen]     Can not take because of Liver  . 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 (pt was in the hospital when this occurred - May  2016) Has patient had a PCN reaction causing immediate rash, facial/tongue/throat swelling, SOB or lightheadedness with hypotension: No Has patient had a PCN reaction causing severe rash involving mucus membranes or skin necrosis: No Has patient had a PCN reaction that required hospitalization Yes Has patient had a PCN reaction occurring within the last 10 years: Yes If all of the above answers are "NO", then moay proceed with Ceph    Medications; Prior to Admission medications   Medication Sig Start Date End Date Taking? Authorizing Provider  azaTHIOprine (IMURAN) 50 MG tablet Take 25 mg by mouth daily.    Yes Historical Provider, MD  carvedilol (COREG) 3.125 MG tablet Take 3.125 mg by mouth 2 (two) times daily with a meal.    Yes Historical Provider, MD  furosemide (LASIX) 20 MG tablet Take 2 tablets (40 mg total) by mouth daily. Patient taking differently: Take 20 mg by mouth 2 (two) times daily.  10/12/14  Yes Shanker Kristeen Mans, MD  lactulose (CHRONULAC) 10 GM/15ML solution Take 30 mLs (20 g total) by mouth 2 (two) times daily. Patient taking differently: Take 20 g by mouth 3 (three) times daily.  09/13/14  Yes Grace Bushy Minor, NP  lidocaine (XYLOCAINE) 2 % solution Use as directed 15 mLs in the mouth or throat every 4 (four) hours as needed for mouth pain.   Yes Historical Provider, MD  metoCLOPramide (REGLAN) 5 MG tablet Take 5 mg by mouth 2 (two) times daily.    Yes Historical Provider, MD  omeprazole (PRILOSEC) 40 MG capsule Take 40 mg by mouth daily.   Yes Historical Provider, MD  predniSONE (DELTASONE) 5 MG tablet Take 5 mg by mouth daily with breakfast.   Yes Historical Provider, MD  spironolactone (ALDACTONE) 50 MG tablet Take 150 mg by mouth daily.   Yes Historical Provider, MD  sulfamethoxazole-trimethoprim (BACTRIM DS,SEPTRA DS) 800-160 MG tablet Take 1 tablet by mouth 2 (two) times daily.   Yes Historical Provider, MD  ursodiol (ACTIGALL) 300 MG capsule Take 300 mg by mouth 2  (two) times daily.    Yes Historical Provider, MD  nitrofurantoin, macrocrystal-monohydrate, (MACROBID) 100 MG capsule Take 1 capsule (100 mg total) by mouth 2 (two) times daily. Patient not taking: Reported on 01/17/2015 12/23/14   Gustavus Bryant, MD  ondansetron (ZOFRAN ODT) 4 MG disintegrating tablet 77m ODT q4 hours prn nausea/vomit Patient not taking: Reported on 12/23/2014 11/17/14   DJulianne Rice MD  ondansetron (ZOFRAN ODT) 4 MG disintegrating tablet Take 1 tablet (4 mg total) by mouth every 8 (eight) hours as needed for nausea or vomiting. Patient not taking: Reported on 01/09/2015 12/23/14   JGustavus Bryant MD  ondansetron (ZOFRAN) 4 MG tablet Take 1 tablet (4 mg total) by mouth every 8 (eight) hours as needed for nausea or vomiting. Patient not taking: Reported on 11/23/2014 10/12/14   SJonetta Osgood MD  oxyCODONE (ROXICODONE) 5 MG immediate release tablet Take 1 tablet (5 mg total) by mouth every 4 (four) hours as needed for severe pain. Patient not taking: Reported on 12/23/2014 11/17/14   DJulianne Rice MD  rifaximin (  XIFAXAN) 550 MG TABS tablet Take 1 tablet (550 mg total) by mouth 2 (two) times daily. Patient not taking: Reported on 12/23/2014 09/13/14   Grace Bushy Minor, NP  valGANciclovir (VALCYTE) 450 MG tablet Take 2 tablets (900 mg total) by mouth 2 (two) times daily. Patient not taking: Reported on 01/11/2015 10/12/14   Jonetta Osgood, MD   . potassium chloride  40 mEq Oral Once  . sodium chloride  3 mL Intravenous Q12H   PRN Meds fentaNYL (SUBLIMAZE) injection, HYDROmorphone (DILAUDID) injection Results for orders placed or performed during the hospital encounter of 01/17/15 (from the past 48 hour(s))  I-Stat Beta hCG blood, ED ([facility], [facility], [facility] only)     Status: None   Collection Time: 01/17/15  5:41 AM  Result Value Ref Range   I-stat hCG, quantitative <5.0 <5 mIU/mL   Comment 3            Comment:   GEST. AGE      CONC.  (mIU/mL)   <=1 WEEK        5 - 50     2  WEEKS       50 - 500     3 WEEKS       100 - 10,000     4 WEEKS     1,000 - 30,000        FEMALE AND NON-PREGNANT FEMALE:     LESS THAN 5 mIU/mL   CBC with Differential     Status: Abnormal   Collection Time: 01/17/15  5:45 AM  Result Value Ref Range   WBC 5.7 4.0 - 10.5 K/uL   RBC 2.93 (L) 3.87 - 5.11 MIL/uL   Hemoglobin 10.0 (L) 12.0 - 15.0 g/dL   HCT 28.9 (L) 36.0 - 46.0 %   MCV 98.6 78.0 - 100.0 fL   MCH 34.1 (H) 26.0 - 34.0 pg   MCHC 34.6 30.0 - 36.0 g/dL   RDW 16.8 (H) 11.5 - 15.5 %   Platelets 127 (L) 150 - 400 K/uL   Neutrophils Relative % 58 %   Neutro Abs 3.3 1.7 - 7.7 K/uL   Lymphocytes Relative 14 %   Lymphs Abs 0.8 0.7 - 4.0 K/uL   Monocytes Relative 28 %   Monocytes Absolute 1.6 (H) 0.1 - 1.0 K/uL   Eosinophils Relative 0 %   Eosinophils Absolute 0.0 0.0 - 0.7 K/uL   Basophils Relative 0 %   Basophils Absolute 0.0 0.0 - 0.1 K/uL  Comprehensive metabolic panel     Status: Abnormal   Collection Time: 01/17/15  5:45 AM  Result Value Ref Range   Sodium 124 (L) 135 - 145 mmol/L   Potassium 2.7 (LL) 3.5 - 5.1 mmol/L    Comment: CRITICAL RESULT CALLED TO, READ BACK BY AND VERIFIED WITH: Kasandra Knudsen [phone] [facility]    Chloride 89 (L) 101 - 111 mmol/L   CO2 25 22 - 32 mmol/L   Glucose, Bld 117 (H) 65 - 99 mg/dL   BUN 7 6 - 20 mg/dL   Creatinine, Ser 0.77 0.44 - 1.00 mg/dL   Calcium 8.0 (L) 8.9 - 10.3 mg/dL   Total Protein 5.5 (L) 6.5 - 8.1 g/dL   Albumin 2.7 (L) 3.5 - 5.0 g/dL   AST 44 (H) 15 - 41 U/L   ALT 31 14 - 54 U/L   Alkaline Phosphatase 395 (H) 38 - 126 U/L   Total Bilirubin 7.0 (H) 0.3 - 1.2 mg/dL  GFR calc non Af Amer >60 >60 mL/min   GFR calc Af Amer >60 >60 mL/min    Comment: (NOTE) The eGFR has been calculated using the CKD EPI equation. This calculation has not been validated in all clinical situations. eGFR's persistently <60 mL/min signify possible Chronic Kidney Disease.    Anion gap 10 5 - 15  Lipase, blood     Status: Abnormal    Collection Time: 01/17/15  5:45 AM  Result Value Ref Range   Lipase 61 (H) 11 - 51 U/L  I-Stat CG4 Lactic Acid, ED     Status: Abnormal   Collection Time: 01/17/15  5:49 AM  Result Value Ref Range   Lactic Acid, Venous 2.25 (HH) 0.5 - 2.0 mmol/L   Comment NOTIFIED PHYSICIAN   Urinalysis, Routine w reflex microscopic (not at [facility])     Status: Abnormal   Collection Time: 01/17/15  7:00 AM  Result Value Ref Range   Color, Urine ORANGE (A) YELLOW    Comment: BIOCHEMICALS MAY BE AFFECTED BY COLOR   APPearance CLEAR CLEAR   Specific Gravity, Urine 1.019 1.005 - 1.030   pH 6.5 5.0 - 8.0   Glucose, UA NEGATIVE NEGATIVE mg/dL   Hgb urine dipstick NEGATIVE NEGATIVE   Bilirubin Urine LARGE (A) NEGATIVE   Ketones, ur NEGATIVE NEGATIVE mg/dL   Protein, ur NEGATIVE NEGATIVE mg/dL   Nitrite NEGATIVE NEGATIVE   Leukocytes, UA TRACE (A) NEGATIVE  Urine microscopic-add on     Status: Abnormal   Collection Time: 01/17/15  7:00 AM  Result Value Ref Range   Squamous Epithelial / LPF 6-30 (A) NONE SEEN   WBC, UA 0-5 0 - 5 WBC/hpf   RBC / HPF 0-5 0 - 5 RBC/hpf   Bacteria, UA FEW (A) NONE SEEN  Pregnancy, urine     Status: None   Collection Time: 01/17/15  7:00 AM  Result Value Ref Range   Preg Test, Ur NEGATIVE NEGATIVE    Comment:        THE SENSITIVITY OF THIS METHODOLOGY IS >20 mIU/mL.   Culture, body fluid-bottle     Status: None (Preliminary result)   Collection Time: 01/17/15  9:56 AM  Result Value Ref Range   Specimen Description FLUID ABDOMEN ASCITIC    Special Requests BOTTLES DRAWN AEROBIC AND ANAEROBIC 10MLS    Culture PENDING    Report Status PENDING   Gram stain     Status: None   Collection Time: 01/17/15  9:56 AM  Result Value Ref Range   Specimen Description FLUID ABDOMEN ASCITIC    Special Requests NONE    Gram Stain      MODERATE WBC PRESENT,BOTH PMN AND MONONUCLEAR NO ORGANISMS SEEN    Report Status 01/17/2015 FINAL   CBG monitoring, ED     Status: Abnormal    Collection Time: 01/17/15 10:36 AM  Result Value Ref Range   Glucose-Capillary 123 (H) 65 - 99 mg/dL  Glucose, pleural or peritoneal fluid     Status: None   Collection Time: 01/17/15 10:53 AM  Result Value Ref Range   Glucose, Fluid 124 mg/dL    Comment: (NOTE) No normal range established for this test Results should be evaluated in conjunction with serum values    Fluid Type-FGLU PERITONEAL CAVITY   Body fluid cell count with differential     Status: None   Collection Time: 01/17/15 10:53 AM  Result Value Ref Range   Fluid Type-FCT PERITONEAL CAVITY    Color, Fluid  YELLOW YELLOW   Appearance, Fluid CLEAR CLEAR   WBC, Fluid 34 0 - 1000 cu mm   Neutrophil Count, Fluid 1 0 - 25 %   Lymphs, Fluid 14 %   Monocyte-Macrophage-Serous Fluid 85 50 - 90 %   Eos, Fluid 0 %   Other Cells, Fluid FEW MESOTHELIAL CELLS PRESENT. %    Ct Abdomen Pelvis W Contrast  01/17/2015  CLINICAL DATA:  Hepatitis-C, cirrhosis, nausea, mid abdominal pain EXAM: CT ABDOMEN AND PELVIS WITH CONTRAST TECHNIQUE: Multidetector CT imaging of the abdomen and pelvis was performed using the standard protocol following bolus administration of intravenous contrast. CONTRAST:  73m OMNIPAQUE IOHEXOL 300 MG/ML  SOLN COMPARISON:  12/23/2014 FINDINGS: Sagittal images of the spine are unremarkable. There is significant abdominal ascites. Large perihepatic and perisplenic ascites. Large ascites noted bilateral paracolic gutters. Large pelvic ascites. Again noted atrophic liver with nodular contour consistent with cirrhosis. Significant splenomegaly. Splenic infarcts are poorly visualized. Again noted portal venous hypertension with prominent size splenic vein and varices in upper abdomen. 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 There is no small bowel obstruction. No free abdominal air. Recanalized paraumbilical vein again noted. Again noted periesophageal and  gastrohepatic ligament varices. Left ovarian vein varices are again noted. There is no small bowel obstruction. No aortic aneurysm. No free abdominal air. The pancreas and adrenal glands are unremarkable. Kidneys are symmetrical in size and enhancement. No hydronephrosis or hydroureter. Small umbilical hernia containing fluid. Small uterus and ovaries again noted. The uterus is anteflexed. Moderate stool noted within sigmoid colon and rectum. No distal colonic obstruction. There is no inguinal adenopathy. Mild distended urinary bladder. No destructive bony lesions are noted within pelvis. IMPRESSION: 1. Again noted large abdominal ascites and changes of hepatic cirrhosis with portal hypertension. Multiple upper abdominal varices and significant splenomegaly again noted. 2. 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 3. Large amount of pelvic ascites. No hydronephrosis or hydroureter. 4. No small bowel or colonic obstruction. Moderate stool noted within sigmoid colon and rectum. 5. Mild distended urinary bladder. Electronically Signed   By: LLahoma CrockerM.D.   On: 01/17/2015 15:52              Blood pressure 117/70, pulse 112, temperature 97.7 F (36.5 C), temperature source Axillary, resp. rate 17, height '4\' 11"'  (1.499 m), weight 44.453 kg (98 lb), SpO2 96 %.  Physical exam:   General--no acute distress  ENT--sclera icteric  Heart--regular rate and rhythm without murmurs or gallops  Lungs--clear  Abdomen--distended ascites is present there is mild tenderness throughout bowel sounds are positive  Extremities - trace edema  Assessment: 1. Abdominal pain. Etiology unclear. SBP unlikely given the findings of the paracentesis. At this point I would treat her symptomatically. 2. 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 She apparently is not going to be a transplant candidate cause of noncompliance and very poor social situation that is  [facility].  Plan: 1. At this point I would hold on anticoagulation and [place]. I'm not sure that she really understands this point that she is not a transplant candidate. It may be helpful to have palliative care see her to help with her accepting this.   Yasamin Karel JR,Bitha Fauteux L 01/17/2015, 6:08 PM  Pager: [phone] If no answer or after hours call [phone]

## 2015-01-17 NOTE — ED Notes (Signed)
Called CT to verify pt was able to have CT done

## 2015-01-17 NOTE — ED Notes (Signed)
Heat applied to pain

## 2015-01-17 NOTE — ED Notes (Signed)
Paracentesis Tray and equipment at the bedside

## 2015-01-17 NOTE — ED Notes (Signed)
Sent A11 to [employer] for Paracentesis Tray and collection containers. None located in the bulk storage.

## 2015-01-17 NOTE — ED Provider Notes (Signed)
The pt is a 24 y/o female with autoimmune hepatitis - she has had paracentesis in the past - she today states that she had acute onset of pain this AM early hours of the morning and has ongoing pain - she has diffuse abd ttp with mild guarding but no cough pain.  US shows lots of free fluid c/w ascites - will need to perform paracentesis and get sample.  Medical screening examination/treatment/procedure(s) were conducted as a shared visit with non-physician practitioner(s) and myself.  I personally evaluated the patient during the encounter.  Clinical Impression:   Final diagnoses:  None     EMERGENCY DEPARTMENT US FAST EXAM  INDICATIONS: Abdominal pain - hx of liver failure  PERFORMED BY: Myself  IMAGES ARCHIVED?: Yes  FINDINGS: RUQ view positive, LUQ view positive and Pelvic view positive  LIMITATIONS:  Body habitus  INTERPRETATION:  Abdominal free fluid present  COMMENT:  Ascites present      Procedure Note:   Paracentesis:  The pt was informed of the Risks, benefits and alternatives of the procedure and gave written consent Indication for paracentesis, was for diagnostic (rule out SBP) as well as therapeutic benefit. Pt was placed in the supine position An US was used to located the are of the abdomen with largest accumulatio of fluid Skin was prepped and draped in sterile fashion, with chloraprep 1 cc of lidocaine with epi was used to anesthetize the skin with a 25 gauge needle Small nick in the skin with 11 blade Advanced catheter in sterile manner until withdrawn fluid, angio cath advanced and needle withdrawn - no bleeding 2L of fluid removed without dificulty and pt tolerated procedure without difficulty.      Eber HongBrian Arneisha Kincannon, MD 01/18/15 [phone]

## 2015-01-17 NOTE — ED Notes (Signed)
Attempted Report x1.   

## 2015-01-17 NOTE — Consult Note (Signed)
Reason for Consult:SMV thrombus Referring Physician: Renne Crigler MD  Melanie Conley is an 24 y.o. female.  HPI: Asked to see patient at the request of Dr Renne Crigler for SMV thrombus and abdominal pain.  Complex history of ESLD but not felt to be a TXP candidate.  Complains of diffuse abdominal pain but has pain at baseline.  Had a paracentesis last week for ascites.  She is followed at [facility].  Wants to go home. 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  Past Medical History  Diagnosis Date  . Anemia 2012  . 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 GIB (gastrointestinal bleeding)   . Cirrhosis ([place]) 2003  . Irregular periods 08/04/2014  . Vaginal Pap smear, abnormal   . Hepatic encephalopathy ([place])   . Coagulopathy ([place])   . STD (female)     mutiple. risky sexual behavior.   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 Ascites 2003  . Psychosis     Past Surgical History  Procedure Laterality Date  . Gastric banding port revision    . Tonsillectomy    . Esophagogastroduodenoscopy  07/22/2011    Procedure: ESOPHAGOGASTRODUODENOSCOPY (EGD);  Surgeon: Wonda Horner, MD;  Location: [facility] ENDOSCOPY;  Service: Endoscopy;  Laterality: N/A;  . Esophagogastroduodenoscopy N/A 09/11/2014    Procedure: ESOPHAGOGASTRODUODENOSCOPY (EGD);  Surgeon: Wilford Corner, MD;  Location: [facility] ENDOSCOPY;  Service: Endoscopy;  Laterality: N/A;    Family History  Problem Relation Age of Onset  . Adopted: Yes    Social History:  reports that she has quit smoking. Her smoking use included Cigarettes. She has a 4 pack-year smoking history. She has never used smokeless tobacco. She reports that she does not drink alcohol or use illicit drugs.  Allergies:  Allergies  Allergen Reactions  . Nsaids     Can not take because of Liver  . Tylenol [Acetaminophen]     Can not take  because of Liver  . 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 (pt was in the hospital when this occurred - May 2016) Has patient had a PCN reaction causing immediate rash, facial/tongue/throat swelling, SOB or lightheadedness with hypotension: No Has patient had a PCN reaction causing severe rash involving mucus membranes or skin necrosis: No Has patient had a PCN reaction that required hospitalization Yes Has patient had a PCN reaction occurring within the last 10 years: Yes If all of the above answers are "NO", then moay proceed with Ceph    Medications: I have reviewed the patient's current medications.  Results for orders placed or performed during the hospital encounter of 01/17/15 (from the past 48 hour(s))  I-Stat Beta hCG blood, ED ([facility], [facility], [facility] only)     Status: None   Collection Time: 01/17/15  5:41 AM  Result Value Ref Range   I-stat hCG, quantitative <5.0 <5 mIU/mL   Comment 3            Comment:   GEST. AGE      CONC.  (mIU/mL)   <=1 WEEK        5 - 50     2 WEEKS       50 - 500     3 WEEKS       100 - 10,000     4 WEEKS  1,000 - 30,000        FEMALE AND NON-PREGNANT FEMALE:     LESS THAN 5 mIU/mL   CBC with Differential     Status: Abnormal   Collection Time: 01/17/15  5:45 AM  Result Value Ref Range   WBC 5.7 4.0 - 10.5 K/uL   RBC 2.93 (L) 3.87 - 5.11 MIL/uL   Hemoglobin 10.0 (L) 12.0 - 15.0 g/dL   HCT 28.9 (L) 36.0 - 46.0 %   MCV 98.6 78.0 - 100.0 fL   MCH 34.1 (H) 26.0 - 34.0 pg   MCHC 34.6 30.0 - 36.0 g/dL   RDW 16.8 (H) 11.5 - 15.5 %   Platelets 127 (L) 150 - 400 K/uL   Neutrophils Relative % 58 %   Neutro Abs 3.3 1.7 - 7.7 K/uL   Lymphocytes Relative 14 %   Lymphs Abs 0.8 0.7 - 4.0 K/uL   Monocytes Relative 28 %   Monocytes Absolute 1.6 (H) 0.1 - 1.0 K/uL   Eosinophils Relative 0 %   Eosinophils Absolute 0.0 0.0 - 0.7 K/uL   Basophils Relative 0 %   Basophils Absolute 0.0 0.0 - 0.1 K/uL  Comprehensive metabolic panel     Status:  Abnormal   Collection Time: 01/17/15  5:45 AM  Result Value Ref Range   Sodium 124 (L) 135 - 145 mmol/L   Potassium 2.7 (LL) 3.5 - 5.1 mmol/L    Comment: CRITICAL RESULT CALLED TO, READ BACK BY AND VERIFIED WITH: Kasandra Knudsen [id number] [facility]    Chloride 89 (L) 101 - 111 mmol/L   CO2 25 22 - 32 mmol/L   Glucose, Bld 117 (H) 65 - 99 mg/dL   BUN 7 6 - 20 mg/dL   Creatinine, Ser 0.77 0.44 - 1.00 mg/dL   Calcium 8.0 (L) 8.9 - 10.3 mg/dL   Total Protein 5.5 (L) 6.5 - 8.1 g/dL   Albumin 2.7 (L) 3.5 - 5.0 g/dL   AST 44 (H) 15 - 41 U/L   ALT 31 14 - 54 U/L   Alkaline Phosphatase 395 (H) 38 - 126 U/L   Total Bilirubin 7.0 (H) 0.3 - 1.2 mg/dL   GFR calc non Af Amer >60 >60 mL/min   GFR calc Af Amer >60 >60 mL/min    Comment: (NOTE) The eGFR has been calculated using the CKD EPI equation. This calculation has not been validated in all clinical situations. eGFR's persistently <60 mL/min signify possible Chronic Kidney Disease.    Anion gap 10 5 - 15  Lipase, blood     Status: Abnormal   Collection Time: 01/17/15  5:45 AM  Result Value Ref Range   Lipase 61 (H) 11 - 51 U/L  I-Stat CG4 Lactic Acid, ED     Status: Abnormal   Collection Time: 01/17/15  5:49 AM  Result Value Ref Range   Lactic Acid, Venous 2.25 (HH) 0.5 - 2.0 mmol/L   Comment NOTIFIED PHYSICIAN   Urinalysis, Routine w reflex microscopic (not at [facility])     Status: Abnormal   Collection Time: 01/17/15  7:00 AM  Result Value Ref Range   Color, Urine ORANGE (A) YELLOW    Comment: BIOCHEMICALS MAY BE AFFECTED BY COLOR   APPearance CLEAR CLEAR   Specific Gravity, Urine 1.019 1.005 - 1.030   pH 6.5 5.0 - 8.0   Glucose, UA NEGATIVE NEGATIVE mg/dL   Hgb urine dipstick NEGATIVE NEGATIVE   Bilirubin Urine LARGE (A) NEGATIVE   Ketones, ur NEGATIVE NEGATIVE  mg/dL   Protein, ur NEGATIVE NEGATIVE mg/dL   Nitrite NEGATIVE NEGATIVE   Leukocytes, UA TRACE (A) NEGATIVE  Urine microscopic-add on     Status: Abnormal   Collection  Time: 01/17/15  7:00 AM  Result Value Ref Range   Squamous Epithelial / LPF 6-30 (A) NONE SEEN   WBC, UA 0-5 0 - 5 WBC/hpf   RBC / HPF 0-5 0 - 5 RBC/hpf   Bacteria, UA FEW (A) NONE SEEN  Pregnancy, urine     Status: None   Collection Time: 01/17/15  7:00 AM  Result Value Ref Range   Preg Test, Ur NEGATIVE NEGATIVE    Comment:        THE SENSITIVITY OF THIS METHODOLOGY IS >20 mIU/mL.   Culture, body fluid-bottle     Status: None (Preliminary result)   Collection Time: 01/17/15  9:56 AM  Result Value Ref Range   Specimen Description FLUID ABDOMEN ASCITIC    Special Requests BOTTLES DRAWN AEROBIC AND ANAEROBIC 10MLS    Culture PENDING    Report Status PENDING   Gram stain     Status: None   Collection Time: 01/17/15  9:56 AM  Result Value Ref Range   Specimen Description FLUID ABDOMEN ASCITIC    Special Requests NONE    Gram Stain      MODERATE WBC PRESENT,BOTH PMN AND MONONUCLEAR NO ORGANISMS SEEN    Report Status 01/17/2015 FINAL   CBG monitoring, ED     Status: Abnormal   Collection Time: 01/17/15 10:36 AM  Result Value Ref Range   Glucose-Capillary 123 (H) 65 - 99 mg/dL  Glucose, pleural or peritoneal fluid     Status: None   Collection Time: 01/17/15 10:53 AM  Result Value Ref Range   Glucose, Fluid 124 mg/dL    Comment: (NOTE) No normal range established for this test Results should be evaluated in conjunction with serum values    Fluid Type-FGLU PERITONEAL CAVITY   Body fluid cell count with differential     Status: None   Collection Time: 01/17/15 10:53 AM  Result Value Ref Range   Fluid Type-FCT PERITONEAL CAVITY    Color, Fluid YELLOW YELLOW   Appearance, Fluid CLEAR CLEAR   WBC, Fluid 34 0 - 1000 cu mm   Neutrophil Count, Fluid 1 0 - 25 %   Lymphs, Fluid 14 %   Monocyte-Macrophage-Serous Fluid 85 50 - 90 %   Eos, Fluid 0 %   Other Cells, Fluid FEW MESOTHELIAL CELLS PRESENT. %    Ct Abdomen Pelvis W Contrast  01/17/2015  CLINICAL DATA:  Hepatitis-C,  cirrhosis, nausea, mid abdominal pain EXAM: CT ABDOMEN AND PELVIS WITH CONTRAST TECHNIQUE: Multidetector CT imaging of the abdomen and pelvis was performed using the standard protocol following bolus administration of intravenous contrast. CONTRAST:  41m OMNIPAQUE IOHEXOL 300 MG/ML  SOLN COMPARISON:  12/23/2014 FINDINGS: Sagittal images of the spine are unremarkable. There is significant abdominal ascites. Large perihepatic and perisplenic ascites. Large ascites noted bilateral paracolic gutters. Large pelvic ascites. Again noted atrophic liver with nodular contour consistent with cirrhosis. Significant splenomegaly. Splenic infarcts are poorly visualized. Again noted portal venous hypertension with prominent size splenic vein and varices in upper abdomen. 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 There is no small bowel obstruction. No free abdominal air. Recanalized paraumbilical vein again noted. Again noted periesophageal and gastrohepatic ligament varices. Left ovarian vein varices are again noted. There  is no small bowel obstruction. No aortic aneurysm. No free abdominal air. The pancreas and adrenal glands are unremarkable. Kidneys are symmetrical in size and enhancement. No hydronephrosis or hydroureter. Small umbilical hernia containing fluid. Small uterus and ovaries again noted. The uterus is anteflexed. Moderate stool noted within sigmoid colon and rectum. No distal colonic obstruction. There is no inguinal adenopathy. Mild distended urinary bladder. No destructive bony lesions are noted within pelvis. IMPRESSION: 1. Again noted large abdominal ascites and changes of hepatic cirrhosis with portal hypertension. Multiple upper abdominal varices and significant splenomegaly again noted. 2. 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 3. Large amount of pelvic ascites. No hydronephrosis or  hydroureter. 4. No small bowel or colonic obstruction. Moderate stool noted within sigmoid colon and rectum. 5. Mild distended urinary bladder. Electronically Signed   By: Lahoma Crocker M.D.   On: 01/17/2015 15:52    Review of Systems  Constitutional: Positive for malaise/fatigue.  HENT: Negative.   Eyes: Negative.   Respiratory: Negative.   Cardiovascular: Negative for chest pain.  Gastrointestinal: Positive for abdominal pain.  Skin: Positive for itching.  Neurological: Negative.   Psychiatric/Behavioral: Positive for depression.   Blood pressure 117/72, pulse 114, temperature 98.5 F (36.9 C), temperature source Oral, resp. rate 18, height 4' 11" (1.499 m), weight 43.7 kg (96 lb 5.5 oz), SpO2 99 %. Physical Exam  HENT:  Head: Normocephalic.  Eyes: Scleral icterus is present.  Neck: Normal range of motion.  Cardiovascular: Normal rate.   GI: Soft. She exhibits shifting dullness, fluid wave and ascites. There is generalized tenderness. There is no rigidity, no rebound, no guarding, no tenderness at McBurney's point and negative Murphy's sign. A hernia is present.  Musculoskeletal: Normal range of motion.  Neurological: She is alert.  Skin: Skin is warm.    Assessment/Plan: SMV   Thrombus  Nonocclusive Patient Active Problem List   Diagnosis Date Noted  . 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 Abdominal distention 01/17/2015  . Dilutional hyponatremia ([place]) 01/17/2015  . Psychoses   . Psychosis 11/23/2014  . Splenomegaly 11/20/2014  . Ascites   . Pain   . Renal failure   . Encounter for palliative care   . Severe sepsis with acute organ dysfunction ([place]) 09/11/2014  . Acute respiratory failure with hypercapnia ([place])   . Coagulopathy ([place])   . Other cirrhosis of liver ([place])   . Gastric varix   . Colitis 09/05/2014  . Hepatic encephalopathy ([place]) 09/05/2014  . Acute blood loss anemia 09/05/2014  . Irregular periods 08/04/2014  . Primary sclerosing  cholangitis   . Cirrhosis ([place])   . Pyrexia 06/04/2014  . Nausea and vomiting 06/04/2014  . Cirrhosis, non-alcoholic ([place]) 57/84/6962  . Hypoalbuminemia 06/04/2014  . Pancytopenia ([place])   . Jaundice   . Sepsis ([place]) 05/28/2014  . Generalized abdominal pain   . Spontaneous bacterial peritonitis ([place]) 05/27/2014  . Leukopenia 05/27/2014  . Hypokalemia   . Neutropenia ([place])   . SBP (spontaneous bacterial peritonitis) ([place]) 01/14/2014  . Autoimmune hepatitis ([place])   . Protein-calorie malnutrition, severe ([place]) 01/13/2014  . Abdominal pain, acute 01/12/2014  . Abdominal pain 01/12/2014  . Anemia   . GI bleeding 07/22/2011  Discussed case with Dr Oletta Lamas No acute surgical indication and anticoagulation not recommended by [facility] specialist who follows her.  No sign of bowel perforation or need for surgical care.  Available as needed    , A. 01/17/2015, 6:35  PM      

## 2015-01-17 NOTE — ED Provider Notes (Signed)
CSN: [id number]     Arrival date & time 01/17/15  0518 History   First MD Initiated Contact with Patient 01/17/15 [phone]     Chief Complaint  Patient presents with  . Abdominal Pain   (Consider location/radiation/quality/duration/timing/severity/associated sxs/prior Treatment) HPI  24 y.o. female with a hx of autoimmune hepatitis, presents to the Emergency Department today complaining of abdominal pain that started at 3:30a. She reports that she had mild discomfort last night before bed as well, which she took lidocaine PO with no relief. When asked if this has happened before, the patient states that it has and has had multiple episodes in the past. Describes the pain a a stretching/pulling sensation that is a 9/10 pain. No N/V/D, no reported fever. Has therapeutic paracentesis scheduled every 2 weeks due to Ascites. Has scheduled an upper endoscopy for tomorrow at [facility].        Past Medical History  Diagnosis Date  . Anemia 2012  . Esophageal varices (HCC) 2015    s/p multiple EGDs and bandings dating to 2015.   Marland Kitchen. 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 GIB (gastrointestinal bleeding)   . Cirrhosis (HCC) 2003  . Irregular periods 08/04/2014  . Vaginal Pap smear, abnormal   . Hepatic encephalopathy (HCC)   . Coagulopathy (HCC)   . STD (female)     mutiple. risky sexual behavior.   Marland Kitchen. 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 Ascites 2003  . Psychosis    Past Surgical History  Procedure Laterality Date  . Gastric banding port revision    . Tonsillectomy    . Esophagogastroduodenoscopy  07/22/2011    Procedure: ESOPHAGOGASTRODUODENOSCOPY (EGD);  Surgeon: Graylin ShiverSalem F Ganem, MD;  [facility];  Service: Endoscopy;  Laterality: N/A;  . Esophagogastroduodenoscopy N/A 09/11/2014    Procedure: ESOPHAGOGASTRODUODENOSCOPY (EGD);  Surgeon: Charlott RakesVincent Schooler, MD;  [facility];  Service: Endoscopy;  Laterality: N/A;   Family  History  Problem Relation Age of Onset  . Adopted: Yes   Social History  Substance Use Topics  . Smoking status: Former Smoker -- 0.50 packs/day for 8 years    Types: Cigarettes  . Smokeless tobacco: Never Used  . Alcohol Use: No   OB History    Gravida Para Term Preterm AB TAB SAB Ectopic Multiple Living   0 0 0 0 0 0 0 0 0 0      Review of Systems  Constitutional: Negative for fever, chills, diaphoresis and fatigue.  HENT: Negative for congestion, sinus pressure, sore throat and tinnitus.   Eyes: Negative for visual disturbance.  Respiratory: Negative for cough and shortness of breath.   Cardiovascular: Negative for chest pain.  Gastrointestinal: Positive for abdominal pain and abdominal distention. Negative for nausea, vomiting, diarrhea and constipation.  Endocrine: Negative for cold intolerance and heat intolerance.  Musculoskeletal: Positive for back pain.  Skin: Negative for color change.  Neurological: Negative for dizziness, syncope, weakness, numbness and headaches.   Allergies  Nsaids; Tylenol; and Amoxicillin  Home Medications   Prior to Admission medications   Medication Sig Start Date End Date Taking? Authorizing Provider  azaTHIOprine (IMURAN) 50 MG tablet Take 25 mg by mouth daily.    Yes Historical Provider, MD  carvedilol (COREG) 3.125 MG tablet Take 3.125 mg by mouth 2 (two) times daily with a meal.    Yes Historical Provider, MD  furosemide (LASIX) 20 MG tablet Take 2 tablets (40 mg total) by mouth  daily. Patient taking differently: Take 20 mg by mouth 2 (two) times daily.  10/12/14  Yes Shanker Levora Dredge, MD  lactulose (CHRONULAC) 10 GM/15ML solution Take 30 mLs (20 g total) by mouth 2 (two) times daily. Patient taking differently: Take 20 g by mouth 3 (three) times daily.  09/13/14  Yes Vilinda Blanks Minor, NP  lidocaine (XYLOCAINE) 2 % solution Use as directed 15 mLs in the mouth or throat every 4 (four) hours as needed for mouth pain.   Yes Historical  Provider, MD  metoCLOPramide (REGLAN) 5 MG tablet Take 5 mg by mouth 2 (two) times daily.    Yes Historical Provider, MD  omeprazole (PRILOSEC) 40 MG capsule Take 40 mg by mouth daily.   Yes Historical Provider, MD  predniSONE (DELTASONE) 5 MG tablet Take 5 mg by mouth daily with breakfast.   Yes Historical Provider, MD  spironolactone (ALDACTONE) 50 MG tablet Take 150 mg by mouth daily.   Yes Historical Provider, MD  sulfamethoxazole-trimethoprim (BACTRIM DS,SEPTRA DS) 800-160 MG tablet Take 1 tablet by mouth 2 (two) times daily.   Yes Historical Provider, MD  ursodiol (ACTIGALL) 300 MG capsule Take 300 mg by mouth 2 (two) times daily.    Yes Historical Provider, MD  nitrofurantoin, macrocrystal-monohydrate, (MACROBID) 100 MG capsule Take 1 capsule (100 mg total) by mouth 2 (two) times daily. Patient not taking: Reported on 01/17/2015 12/23/14   Maris Berger, MD  ondansetron (ZOFRAN ODT) 4 MG disintegrating tablet  ODT q4 hours prn nausea/vomit Patient not taking: Reported on 12/23/2014 11/17/14   Loren Racer, MD  ondansetron (ZOFRAN ODT) 4 MG disintegrating tablet Take 1 tablet (4 mg total) by mouth every 8 (eight) hours as needed for nausea or vomiting. Patient not taking: Reported on 01/09/2015 12/23/14   Maris Berger, MD  ondansetron (ZOFRAN) 4 MG tablet Take 1 tablet (4 mg total) by mouth every 8 (eight) hours as needed for nausea or vomiting. Patient not taking: Reported on 11/23/2014 10/12/14   Maretta Bees, MD  oxyCODONE (ROXICODONE) 5 MG immediate release tablet Take 1 tablet (5 mg total) by mouth every 4 (four) hours as needed for severe pain. Patient not taking: Reported on 12/23/2014 11/17/14   Loren Racer, MD  rifaximin (XIFAXAN) 550 MG TABS tablet Take 1 tablet (550 mg total) by mouth 2 (two) times daily. Patient not taking: Reported on 12/23/2014 09/13/14   Vilinda Blanks Minor, NP  valGANciclovir (VALCYTE) 450 MG tablet Take 2 tablets (900 mg total) by mouth 2 (two)  times daily. Patient not taking: Reported on 01/11/2015 10/12/14   Maretta Bees, MD   BP 123/84 mmHg  Pulse 124  Temp(Src) 99.2 F (37.3 C) (Oral)  Resp 20  Ht  (1.499 m)  Wt 44.453 kg  BMI 19.78 kg/m2  SpO2 100% Physical Exam  Constitutional: She is oriented to person, place, and time. She appears well-developed and well-nourished.  HENT:  Head: Normocephalic and atraumatic.  Eyes: Pupils are equal, round, and reactive to light.  Neck: Neck supple.  Cardiovascular: Normal rate and regular rhythm.   Pulmonary/Chest: Effort normal and breath sounds normal.  Abdominal: Bowel sounds are normal. She exhibits distension and ascites. There is tenderness in the right upper quadrant, right lower quadrant, epigastric area and left upper quadrant. A hernia (has had for over one year) is present.  No sign of Asterixis noted  Neurological: She is alert and oriented to person, place, and time.  Skin: Skin is warm and dry.  Psychiatric: She has a normal mood and affect. Her speech is normal and behavior is normal.  No AMS   ED Course  Procedures (including critical care time) Labs Review Labs Reviewed  CBC WITH DIFFERENTIAL/PLATELET - Abnormal; Notable for the following:    RBC 2.93 (*)    Hemoglobin 10.0 (*)    HCT 28.9 (*)    MCH 34.1 (*)    RDW 16.8 (*)    Platelets 127 (*)    Monocytes Absolute 1.6 (*)    All other components within normal limits  COMPREHENSIVE METABOLIC PANEL - Abnormal; Notable for the following:    Sodium 124 (*)    Potassium 2.7 (*)    Chloride 89 (*)    Glucose, Bld 117 (*)    Calcium 8.0 (*)    Total Protein 5.5 (*)    Albumin 2.7 (*)    AST 44 (*)    Alkaline Phosphatase 395 (*)    Total Bilirubin 7.0 (*)    All other components within normal limits  URINALYSIS, ROUTINE W REFLEX MICROSCOPIC (NOT AT [facility]) - Abnormal; Notable for the following:    Color, Urine ORANGE (*)    Bilirubin Urine LARGE (*)    Leukocytes, UA TRACE (*)    All other  components within normal limits  LIPASE, BLOOD - Abnormal; Notable for the following:    Lipase 61 (*)    All other components within normal limits  URINE MICROSCOPIC-ADD ON - Abnormal; Notable for the following:    Squamous Epithelial / LPF 6-30 (*)    Bacteria, UA FEW (*)    All other components within normal limits  I-STAT CG4 LACTIC ACID, ED - Abnormal; Notable for the following:    Lactic Acid, Venous 2.25 (*)    All other components within normal limits  CBG MONITORING, ED - Abnormal; Notable for the following:    Glucose-Capillary 123 (*)    All other components within normal limits  CULTURE, BODY FLUID-BOTTLE  GRAM STAIN  CULTURE, BLOOD (ROUTINE X 2)  CULTURE, BLOOD (ROUTINE X 2)  GLUCOSE, SEROUS FLUID  BODY FLUID CELL COUNT WITH DIFFERENTIAL  PREGNANCY, URINE  PROTIME-INR  COMPREHENSIVE METABOLIC PANEL  CBC  I-STAT BETA HCG BLOOD, ED ([facility], [facility], [facility] ONLY)   Imaging Review Ct Abdomen Pelvis W Contrast  01/17/2015  CLINICAL DATA:  Hepatitis-C, cirrhosis, nausea, mid abdominal pain EXAM: CT ABDOMEN AND PELVIS WITH CONTRAST TECHNIQUE: Multidetector CT imaging of the abdomen and pelvis was performed using the standard protocol following bolus administration of intravenous contrast. CONTRAST:  70mL OMNIPAQUE IOHEXOL 300 MG/ML  SOLN COMPARISON:  12/23/2014 FINDINGS: Sagittal images of the spine are unremarkable. There is significant abdominal ascites. Large perihepatic and perisplenic ascites. Large ascites noted bilateral paracolic gutters. Large pelvic ascites. Again noted atrophic liver with nodular contour consistent with cirrhosis. Significant splenomegaly. Splenic infarcts are poorly visualized. Again noted portal venous hypertension with prominent size splenic vein and varices in upper abdomen. 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 There is no small bowel obstruction. No free abdominal air. Recanalized paraumbilical  vein again noted. Again noted periesophageal and gastrohepatic ligament varices. Left ovarian vein varices are again noted. There is no small bowel obstruction. No aortic aneurysm. No free abdominal air. The pancreas and adrenal glands are unremarkable. Kidneys are symmetrical in size and enhancement. No hydronephrosis or hydroureter. Small umbilical hernia containing fluid. Small uterus and ovaries again noted. The uterus  is anteflexed. Moderate stool noted within sigmoid colon and rectum. No distal colonic obstruction. There is no inguinal adenopathy. Mild distended urinary bladder. No destructive bony lesions are noted within pelvis. IMPRESSION: 1. Again noted large abdominal ascites and changes of hepatic cirrhosis with portal hypertension. Multiple upper abdominal varices and significant splenomegaly again noted. 2. 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 3. Large amount of pelvic ascites. No hydronephrosis or hydroureter. 4. No small bowel or colonic obstruction. Moderate stool noted within sigmoid colon and rectum. 5. Mild distended urinary bladder. Electronically Signed   By: Natasha Mead M.D.   On: 01/17/2015 15:52   I have personally reviewed and evaluated these images and lab results as part of my medical decision-making.   EKG Interpretation None     MDM  I have reviewed relevant laboratory values. I have reviewed the relevant previous healthcare records.I obtained HPI from historian. 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 Has been seen previously in the ED for same problem. Vitals are similar to previous visits (tachycardia). Fever was noted at triage (101.39F). Pt denied a fever. Rectal temp showed 99.2F. No AMS. Pt seen in [facility] 12/14 and had paracentesis done with 5.5L drained. Culture of fluid at the time was negative. Pt has upper endoscopy scheduled tomorrow at [facility]. Therapeutic paracentesis in ED showed neg Leukocytes, Glucose. 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    Disposition/Plan:  Therapeutic Paracentesis w/ Culture  Will Admit to surgery due to results of CT scan     Patient was discussed with Eber Hong, MD    Final diagnoses:  Hypokalemia  Hyponatremia  Generalized abdominal pain  Cirrhosis of liver with ascites, unspecified hepatic cirrhosis type [place])      Audry Pili, PA-C 01/17/15 1832  Eber Hong, MD 01/18/15 [phone]

## 2015-01-17 NOTE — ED Notes (Signed)
States she was woken up by abd pain approx 0330. Denies N/V/D. Pain radiates to her back, and says it makes her L hand shaky.

## 2015-01-17 NOTE — ED Notes (Addendum)
Attempted Report x2. 

## 2015-01-17 NOTE — H&P (Signed)
History and Physical   Melanie Conley ZOX:[id number] DOB: 10/04/1990 DOA: 01/17/2015  Referring physician: Dr. Hyacinth Meeker PCP: Woodfin Ganja, MD  Specialists: GI, Surgery   Chief Complaint: abdominal pain  HPI: Melanie Conley is a 24 y.o. 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 She has recurrent ascites requiring regular paracentesis every other week. Patient is quite upset overall in the ED about her medical condition, she is upset that I'm not the specialist that she is waiting for, however she tells me that she's been having abdominal pain worsening over the last couple of days. She denies any fever or chills, she denies any nausea or vomiting, she denies any blood in her stool or melena. She denies any blood in her urine or dysuria. She is not sure whether she is on a transplant list at [facility] or not. She has an appointment follow-up in 2 days at [facility] for a scheduled upper endoscopy, and she tells me she wants to be out of this hospital by tomorrow at 2 PM. In the ED, she was found to be hyponatremic with a sodium of 124, hypokalemic with a potassium of 2.7, thrombocytopenia, anemia, showed an elevated alkaline phosphatase and LFTs, her bilirubin is 7.0. She underwent the bedside paracentesis with 2 L removed. CT scan of the abdomen and pelvis showed nonocclusive SMV thrombus. TRH was for admission for further workup and evaluation  Review of Systems: as per HPI otherwise 10 point ROS negative  Past Medical History  Diagnosis Date  . Anemia 2012  . Esophageal varices (HCC) 2015    s/p multiple EGDs and bandings dating to 2015.   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 GIB (gastrointestinal bleeding)   . Cirrhosis (HCC) 2003  . Irregular  periods 08/04/2014  . Vaginal Pap smear, abnormal   . Hepatic encephalopathy (HCC)   . Coagulopathy (HCC)   . STD (female)     mutiple. risky sexual behavior.   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 Ascites 2003  . Psychosis    Past Surgical History  Procedure Laterality Date  . Gastric banding port revision    . Tonsillectomy    . Esophagogastroduodenoscopy  07/22/2011    Procedure: ESOPHAGOGASTRODUODENOSCOPY (EGD);  Surgeon: Graylin Shiver, MD;  [facility] ENDOSCOPY;  Service: Endoscopy;  Laterality: N/A;  . Esophagogastroduodenoscopy N/A 09/11/2014    Procedure: ESOPHAGOGASTRODUODENOSCOPY (EGD);  Surgeon: Charlott Rakes, MD;  Location: [facility] ENDOSCOPY;  Service: Endoscopy;  Laterality: N/A;   Social History:  reports that she has quit smoking. Her smoking use included Cigarettes. She has a 4 pack-year smoking history. She has never used smokeless tobacco. She reports that she does not drink alcohol or use illicit drugs.  Allergies  Allergen Reactions  . Nsaids     Can not take because of Liver  . Tylenol [Acetaminophen]     Can not take because of Liver  . 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 (pt was in the hospital when this occurred - May 2016) Has patient had a PCN reaction causing immediate rash, facial/tongue/throat swelling, SOB or lightheadedness with hypotension: No Has patient had a PCN reaction causing severe rash involving mucus membranes or skin necrosis: No Has patient had a PCN reaction that  required hospitalization Yes Has patient had a PCN reaction occurring within the last 10 years: Yes If all of the above answers are "NO", then moay proceed with Ceph    Family History  Problem Relation Age of Onset  . Adopted: Yes    Prior to Admission medications   Medication Sig Start Date End Date Taking? Authorizing Provider  azaTHIOprine (IMURAN) 50 MG tablet Take 25 mg by mouth daily.    Yes Historical Provider, MD  carvedilol (COREG) 3.125 MG  tablet Take 3.125 mg by mouth 2 (two) times daily with a meal.    Yes Historical Provider, MD  furosemide (LASIX) 20 MG tablet Take 2 tablets (40 mg total) by mouth daily. Patient taking differently: Take 20 mg by mouth 2 (two) times daily.  10/12/14  Yes Shanker Levora Dredge, MD  lactulose (CHRONULAC) 10 GM/15ML solution Take 30 mLs (20 g total) by mouth 2 (two) times daily. Patient taking differently: Take 20 g by mouth 3 (three) times daily.  09/13/14  Yes Vilinda Blanks Minor, NP  lidocaine (XYLOCAINE) 2 % solution Use as directed 15 mLs in the mouth or throat every 4 (four) hours as needed for mouth pain.   Yes Historical Provider, MD  metoCLOPramide (REGLAN) 5 MG tablet Take 5 mg by mouth 2 (two) times daily.    Yes Historical Provider, MD  omeprazole (PRILOSEC) 40 MG capsule Take 40 mg by mouth daily.   Yes Historical Provider, MD  predniSONE (DELTASONE) 5 MG tablet Take 5 mg by mouth daily with breakfast.   Yes Historical Provider, MD  spironolactone (ALDACTONE) 50 MG tablet Take 150 mg by mouth daily.   Yes Historical Provider, MD  sulfamethoxazole-trimethoprim (BACTRIM DS,SEPTRA DS) 800-160 MG tablet Take 1 tablet by mouth 2 (two) times daily.   Yes Historical Provider, MD  ursodiol (ACTIGALL) 300 MG capsule Take 300 mg by mouth 2 (two) times daily.    Yes Historical Provider, MD  nitrofurantoin, macrocrystal-monohydrate, (MACROBID) 100 MG capsule Take 1 capsule (100 mg total) by mouth 2 (two) times daily. Patient not taking: Reported on 01/17/2015 12/23/14   Maris Berger, MD  ondansetron (ZOFRAN ODT) 4 MG disintegrating tablet  ODT q4 hours prn nausea/vomit Patient not taking: Reported on 12/23/2014 11/17/14   Loren Racer, MD  ondansetron (ZOFRAN ODT) 4 MG disintegrating tablet Take 1 tablet (4 mg total) by mouth every 8 (eight) hours as needed for nausea or vomiting. Patient not taking: Reported on 01/09/2015 12/23/14   Maris Berger, MD  ondansetron (ZOFRAN) 4 MG tablet Take 1 tablet (4  mg total) by mouth every 8 (eight) hours as needed for nausea or vomiting. Patient not taking: Reported on 11/23/2014 10/12/14   Maretta Bees, MD  oxyCODONE (ROXICODONE) 5 MG immediate release tablet Take 1 tablet (5 mg total) by mouth every 4 (four) hours as needed for severe pain. Patient not taking: Reported on 12/23/2014 11/17/14   Loren Racer, MD  rifaximin (XIFAXAN) 550 MG TABS tablet Take 1 tablet (550 mg total) by mouth 2 (two) times daily. Patient not taking: Reported on 12/23/2014 09/13/14   Vilinda Blanks Minor, NP  valGANciclovir (VALCYTE) 450 MG tablet Take 2 tablets (900 mg total) by mouth 2 (two) times daily. Patient not taking: Reported on 01/11/2015 10/12/14   Maretta Bees, MD   Physical Exam: Filed Vitals:   01/17/15 1530 01/17/15 1600 01/17/15 1630 01/17/15 1645  BP: 113/68 116/70 109/82 108/74  Pulse: 111 120 118 109  Temp:  TempSrc:      Resp: Height:      Weight:      SpO2: 94% 98% 98% 94%     GENERAL: NAD  HEENT: head NCAT, + scleral icterus. Pupils round and reactive. Mucous membranes are moist.  NECK: Supple. No carotid bruits.   LUNGS: Clear to auscultation. No wheezing or crackles  HEART: Regular rate and rhythm without murmur. 2+ pulses, no JVD, trace peripheral edema.   ABDOMEN: Abdomen appears distended, ascites present, tender to palpation throughout. Positive bowel sounds.  EXTREMITIES: Without any cyanosis.  NEUROLOGIC: Alert and oriented x3. Nonfocal.  SKIN: No ulceration or induration present.   Labs on Admission:  Basic Metabolic Panel:  Recent Labs Lab 01/17/15 0545  NA 124*  K 2.7*  CL 89*  CO2 25  GLUCOSE 117*  BUN 7  CREATININE 0.77  CALCIUM 8.0*   Liver Function Tests:  Recent Labs Lab 01/17/15 0545  AST 44*  ALT 31  ALKPHOS 395*  BILITOT 7.0*  PROT 5.5*  ALBUMIN 2.7*    Recent Labs Lab 01/17/15 0545  LIPASE 61*   CBC:  Recent Labs Lab 01/17/15 0545  WBC 5.7  NEUTROABS 3.3    HGB 10.0*  HCT 28.9*  MCV 98.6  PLT 127*    CBG:  Recent Labs Lab 01/17/15 1036  GLUCAP 123*   Radiological Exams on Admission: Ct Abdomen Pelvis W Contrast  01/17/2015  CLINICAL DATA:  Hepatitis-C, cirrhosis, nausea, mid abdominal pain EXAM: CT ABDOMEN AND PELVIS WITH CONTRAST TECHNIQUE: Multidetector CT imaging of the abdomen and pelvis was performed using the standard protocol following bolus administration of intravenous contrast. CONTRAST:  70mL OMNIPAQUE IOHEXOL 300 MG/ML  SOLN COMPARISON:  12/23/2014 FINDINGS: Sagittal images of the spine are unremarkable. There is significant abdominal ascites. Large perihepatic and perisplenic ascites. Large ascites noted bilateral paracolic gutters. Large pelvic ascites. Again noted atrophic liver with nodular contour consistent with cirrhosis. Significant splenomegaly. Splenic infarcts are poorly visualized. Again noted portal venous hypertension with prominent size splenic vein and varices in upper abdomen. 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 There is no small bowel obstruction. No free abdominal air. Recanalized paraumbilical vein again noted. Again noted periesophageal and gastrohepatic ligament varices. Left ovarian vein varices are again noted. There is no small bowel obstruction. No aortic aneurysm. No free abdominal air. The pancreas and adrenal glands are unremarkable. Kidneys are symmetrical in size and enhancement. No hydronephrosis or hydroureter. Small umbilical hernia containing fluid. Small uterus and ovaries again noted. The uterus is anteflexed. Moderate stool noted within sigmoid colon and rectum. No distal colonic obstruction. There is no inguinal adenopathy. Mild distended urinary bladder. No destructive bony lesions are noted within pelvis. IMPRESSION: 1. Again noted large abdominal ascites and changes of hepatic cirrhosis with portal hypertension. Multiple upper  abdominal varices and significant splenomegaly again noted. 2. 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 3. Large amount of pelvic ascites. No hydronephrosis or hydroureter. 4. No small bowel or colonic obstruction. Moderate stool noted within sigmoid colon and rectum. 5. Mild distended urinary bladder. Electronically Signed   By: Natasha Mead M.D.   On: 01/17/2015 15:52    EKG: Independently reviewed. Sinus tachycardia  Assessment/Plan Principal Problem:   Superior mesenteric vein thrombosis (HCC) Active Problems:   Anemia   Abdominal pain, acute   Protein-calorie malnutrition, severe (HCC)  Autoimmune hepatitis (HCC)   Hypokalemia   Cirrhosis, non-alcoholic (HCC)   Hypoalbuminemia   Jaundice   Primary sclerosing cholangitis   Coagulopathy (HCC)   Ascites   Abdominal distention   Dilutional hyponatremia (HCC)   Abdominal pain and distention - sp paracentesis in the ED, 2 L removed. No evidence of SBP - We'll ask for formal ultrasound paracentesis given that she still appears to have ascites on exam  SMV thrombosis - this is new, very high risk of anticoagulation given recent GI bleeding, presence of EVs - I have consulted gastroenterology for further help, I would defer to them or whether she will need a repeat EGD prior to restarting anticoagulation or we can just go ahead and anticoagulate and monitor - Surgery consulted by EDP  Recent GI bleeding with esophageal varices - Supp[facility] in 2 days for repeat EGD, GI consulted this hospitalization  Primary sclerosing cholangitis - Continue medication, she has elevated alkaline phosphatase which is expected  ESLD - will obtain protime, cannot calculate MELD currently but is decompensated with elevated bilirubin, ascites, GI bleed - [facility]  Hyponatremia - likely in the setting of fluid overload - Continue home Lasix and  spironolactone  Autoimmune hepatitis - Resume home medications  CMV infection - Resume home medication  Protein calorie malnutrition, severe  Anemia - In the setting of liver disease  Thrombocytopenia  - In the setting of liver disease   Diet: NPO Fluids: none DVT Prophylaxis: SCD  Code Status: FUll  Family Communication: no family bedside  Disposition Plan: admit to telemetry    Lillah Standre M. Elvera LennoxGherghe, MD [facility] Pager [phone]  If 7PM-7AM, please contact night-coverage [email or web address] Password [email or web address] 01/17/2015, 5:09 PM

## 2015-01-17 NOTE — ED Notes (Signed)
MD at the bedside performing Paracentesis

## 2015-01-18 LAB — COMPREHENSIVE METABOLIC PANEL
ALK PHOS: 350 U/L — AB (ref 38–126)
ALT: 33 U/L (ref 14–54)
AST: 49 U/L — AB (ref 15–41)
Albumin: 2.4 g/dL — ABNORMAL LOW (ref 3.5–5.0)
Anion gap: 9 (ref 5–15)
BILIRUBIN TOTAL: 11.6 mg/dL — AB (ref 0.3–1.2)
BUN: 8 mg/dL (ref 6–20)
CALCIUM: 8.7 mg/dL — AB (ref 8.9–10.3)
CO2: 23 mmol/L (ref 22–32)
CREATININE: 0.75 mg/dL (ref 0.44–1.00)
Chloride: 96 mmol/L — ABNORMAL LOW (ref 101–111)
GFR calc Af Amer: >60 mL/min (ref >60)
GFR calc non Af Amer: >60 mL/min (ref >60)
GLUCOSE: 108 mg/dL — AB (ref 65–99)
Potassium: 3.4 mmol/L — ABNORMAL LOW (ref 3.5–5.1)
SODIUM: 128 mmol/L — AB (ref 135–145)
TOTAL PROTEIN: 5.4 g/dL — AB (ref 6.5–8.1)

## 2015-01-18 LAB — CBC
HCT: 28.1 % — ABNORMAL LOW (ref 36.0–46.0)
HEMOGLOBIN: 9.3 g/dL — AB (ref 12.0–15.0)
MCH: 33.2 pg (ref 26.0–34.0)
MCHC: 33.1 g/dL (ref 30.0–36.0)
MCV: 100.4 fL — ABNORMAL HIGH (ref 78.0–100.0)
Platelets: 119 10*3/uL — ABNORMAL LOW (ref 150–400)
RBC: 2.8 MIL/uL — ABNORMAL LOW (ref 3.87–5.11)
RDW: 16.4 % — AB (ref 11.5–15.5)
WBC: 5.1 10*3/uL (ref 4.0–10.5)

## 2015-01-18 LAB — LACTIC ACID, PLASMA: LACTIC ACID, VENOUS: 2.7 mmol/L — AB (ref 0.5–2.0)

## 2015-01-18 NOTE — Progress Notes (Signed)
CRITICAL VALUE ALERT  Critical value received: Lactic Acid 2.7   Date of notification:  01/18/15   Time of notification:  1200  Critical value read back:Yes.    Nurse who received alert:  Leota Jacobsenara Adrinne Sze  MD notified (1st page):  Rito EhrlichKrishnan   Time of first page:  1205  MD notified (2nd page):  Time of second page:  Responding MD:  Rito EhrlichKrishnan  Time MD responded:  1207

## 2015-01-18 NOTE — Progress Notes (Signed)
Discussed discharge summary with patient. Reviewed all medications with patient. Patient ready for discharge.  

## 2015-01-18 NOTE — Progress Notes (Signed)
[facility] PROGRESS NOTE Subjective Pt says she feels better less abd pain  Objective: Vital signs in last 24 hours: Temp:  [97.7 F (36.5 C)-99.1 F (37.3 C)] 98.4 F (36.9 C) (12/21 0509) Pulse Rate:  [101-125] 114 (12/21 0509) Resp:  [17-25] 20 (12/21 0509) BP: (105-121)/(62-91) 118/62 mmHg (12/21 0509) SpO2:  [92 %-100 %] 93 % (12/21 0509) Weight:  [43.7 kg (96 lb 5.5 oz)] 43.7 kg (96 lb 5.5 oz) (12/20 1814) Last BM Date: 01/17/15  Intake/Output from previous day: 12/20 0701 - 12/21 0700 In: 2500 [I.V.:2500] Out: 2300 [Urine:300] Intake/Output this shift: Total I/O In: 300 [P.O.:300] Out: -   PE: General--alert, icteric  Lungs--clear Abdomen--minimal ascites slight tenderness  Lab Results:  Recent Labs  01/17/15 0545 01/18/15 0607  WBC 5.7 5.1  HGB 10.0* 9.3*  HCT 28.9* 28.1*  PLT 127* 119*   BMET  Recent Labs  01/17/15 0545 01/18/15 0607  NA 124* 128*  K 2.7* 3.4*  CL 89* 96*  CO2 25 23  CREATININE 0.77 0.75   LFT  Recent Labs  01/17/15 0545 01/18/15 0607  PROT 5.5* 5.4*  AST 44* 49*  ALT 31 33  ALKPHOS 395* 350*  BILITOT 7.0* 11.6*   PT/INR  Recent Labs  01/17/15 1949  LABPROT 19.8*  INR 1.68*   PANCREAS  Recent Labs  01/17/15 0545  LIPASE 61*         Studies/Results: Ct Abdomen Pelvis W Contrast  01/17/2015  CLINICAL DATA:  Hepatitis-C, cirrhosis, nausea, mid abdominal pain EXAM: CT ABDOMEN AND PELVIS WITH CONTRAST TECHNIQUE: Multidetector CT imaging of the abdomen and pelvis was performed using the standard protocol following bolus administration of intravenous contrast. CONTRAST:  70mL OMNIPAQUE IOHEXOL 300 MG/ML  SOLN COMPARISON:  12/23/2014 FINDINGS: Sagittal images of the spine are unremarkable. There is significant abdominal ascites. Large perihepatic and perisplenic ascites. Large ascites noted bilateral paracolic gutters. Large pelvic ascites. Again noted atrophic liver with nodular contour  consistent with cirrhosis. Significant splenomegaly. Splenic infarcts are poorly visualized. Again noted portal venous hypertension with prominent size splenic vein and varices in upper abdomen. 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 There is no small bowel obstruction. No free abdominal air. Recanalized paraumbilical vein again noted. Again noted periesophageal and gastrohepatic ligament varices. Left ovarian vein varices are again noted. There is no small bowel obstruction. No aortic aneurysm. No free abdominal air. The pancreas and adrenal glands are unremarkable. Kidneys are symmetrical in size and enhancement. No hydronephrosis or hydroureter. Small umbilical hernia containing fluid. Small uterus and ovaries again noted. The uterus is anteflexed. Moderate stool noted within sigmoid colon and rectum. No distal colonic obstruction. There is no inguinal adenopathy. Mild distended urinary bladder. No destructive bony lesions are noted within pelvis. IMPRESSION: 1. Again noted large abdominal ascites and changes of hepatic cirrhosis with portal hypertension. Multiple upper abdominal varices and significant splenomegaly again noted. 2. 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 3. Large amount of pelvic ascites. No hydronephrosis or hydroureter. 4. No small bowel or colonic obstruction. Moderate stool noted within sigmoid colon and rectum. 5. Mild distended urinary bladder. Electronically Signed   By: Natasha MeadLiviu  Pop M.D.   On: 01/17/2015 15:52    Medications: I have reviewed the patient's current medications.  Assessment/Plan: 1. Cirrhosis with ESLD and impending hepatic failure 2. Abdominal Pain ? Cause seems better.  She already has  an appointment at the Liver Transplant clinic at [facility] tomorrow at 8:30. Think she could go home and keep that appointment. Have discussed with pt and mother and they  agree with this.   Amalya Salmons JR,Moriya Mitchell L 01/18/2015, 10:15 AM  Pager: [phone] If no answer or after hours call [phone]

## 2015-01-18 NOTE — Progress Notes (Signed)
[facility] Progress Note     Subjective: Pt says pain improved, but still tender in upper abdomen.  Hungry and wants to eat.  Says she's got a lot to do today and she wants to go home.  Had BM yesterday am and flatus.  Objective: Vital signs in last 24 hours: Temp:  [97.7 F (36.5 C)-99.1 F (37.3 C)] 98.4 F (36.9 C) (12/21 0509) Pulse Rate:  [101-125] 114 (12/21 0509) Resp:  [17-25] 20 (12/21 0509) BP: (105-123)/(62-91) 118/62 mmHg (12/21 0509) SpO2:  [92 %-100 %] 93 % (12/21 0509) Weight:  [43.7 kg (96 lb 5.5 oz)] 43.7 kg (96 lb 5.5 oz) (12/20 1814) Last BM Date: 01/17/15  Intake/Output from previous day: 12/20 0701 - 12/21 0700 In: 2500 [I.V.:2500] Out: 2300 [Urine:300] Intake/Output this shift:    PE: Gen:  Alert, NAD, pleasant Eyes:  Scleral icterus Abd: Soft, mild distension, +BS, no HSM, umbilical hernia reducible and soft, becomes more pronounced when sits up or ambulating.   Lab Results:   Recent Labs  01/17/15 0545 01/18/15 0607  WBC 5.7 5.1  HGB 10.0* 9.3*  HCT 28.9* 28.1*  PLT 127* 119*   BMET  Recent Labs  01/17/15 0545 01/18/15 0607  NA 124* 128*  K 2.7* 3.4*  CL 89* 96*  CO2 25 23  GLUCOSE 117* 108*  BUN 7 8  CREATININE 0.77 0.75  CALCIUM 8.0* 8.7*   PT/INR  Recent Labs  01/17/15 1949  LABPROT 19.8*  INR 1.68*   CMP     Component Value Date/Time   NA 128* 01/18/2015 0607   K 3.4* 01/18/2015 0607   CL 96* 01/18/2015 0607   CO2 23 01/18/2015 0607   GLUCOSE 108* 01/18/2015 0607   BUN 8 01/18/2015 0607   CREATININE 0.75 01/18/2015 0607   CALCIUM 8.7* 01/18/2015 0607   PROT 5.4* 01/18/2015 0607   ALBUMIN 2.4* 01/18/2015 0607   AST 49* 01/18/2015 0607   ALT 33 01/18/2015 0607   ALKPHOS 350* 01/18/2015 0607   BILITOT 11.6* 01/18/2015 0607   GFRNONAA >60 01/18/2015 0607   GFRAA >60 01/18/2015 0607   Lipase     Component Value Date/Time   LIPASE 61* 01/17/2015 0545       Studies/Results: Ct Abdomen  Pelvis W Contrast  01/17/2015  CLINICAL DATA:  Hepatitis-C, cirrhosis, nausea, mid abdominal pain EXAM: CT ABDOMEN AND PELVIS WITH CONTRAST TECHNIQUE: Multidetector CT imaging of the abdomen and pelvis was performed using the standard protocol following bolus administration of intravenous contrast. CONTRAST:  70mL OMNIPAQUE IOHEXOL 300 MG/ML  SOLN COMPARISON:  12/23/2014 FINDINGS: Sagittal images of the spine are unremarkable. There is significant abdominal ascites. Large perihepatic and perisplenic ascites. Large ascites noted bilateral paracolic gutters. Large pelvic ascites. Again noted atrophic liver with nodular contour consistent with cirrhosis. Significant splenomegaly. Splenic infarcts are poorly visualized. Again noted portal venous hypertension with prominent size splenic vein and varices in upper abdomen. 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 There is no small bowel obstruction. No free abdominal air. Recanalized paraumbilical vein again noted. Again noted periesophageal and gastrohepatic ligament varices. Left ovarian vein varices are again noted. There is no small bowel obstruction. No aortic aneurysm. No free abdominal air. The pancreas and adrenal glands are unremarkable. Kidneys are symmetrical in size and enhancement. No hydronephrosis or hydroureter. Small umbilical hernia containing fluid. Small uterus and ovaries again noted. The uterus is anteflexed. Moderate stool noted  within sigmoid colon and rectum. No distal colonic obstruction. There is no inguinal adenopathy. Mild distended urinary bladder. No destructive bony lesions are noted within pelvis. IMPRESSION: 1. Again noted large abdominal ascites and changes of hepatic cirrhosis with portal hypertension. Multiple upper abdominal varices and significant splenomegaly again noted. 2. 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  3. Large amount of pelvic ascites. No hydronephrosis or hydroureter. 4. No small bowel or colonic obstruction. Moderate stool noted within sigmoid colon and rectum. 5. Mild distended urinary bladder. Electronically Signed   By: Natasha Mead M.D.   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  -No sign of bowel perforation or need for surgical care -Likely needs to seek care at [facility] -Advance diet as tolerated if no procedures planned, there was talk about an EGD which is supposed to be done at [facility] tomorrow -D/c home when medically stable to follow up [facility]AP Umbilical hernia - Reducible, not surgical, would follow up with [facility] since all of her care is there, likely not a surgical candidate as it stands with ESLD.  Abdominal binder for comfort.  Avoid heavy lifting.     LOS: 1 day    Nonie Hoyer 01/18/2015, 7:52 AM Pager: [phone]

## 2015-01-18 NOTE — Discharge Summary (Addendum)
Discharge Summary  Melanie Conley ZOX:[id number] DOB: 1990-03-11  PCP: Woodfin Ganja, MD  Admit date: 01/17/2015 Discharge date: 01/18/2015  Time spent: 25 minutes   Recommendations for Outpatient Follow-up:  1. Patient will follow-up with [facility] has a scheduled appointment tomorrow  Discharge Diagnoses:  Active Hospital Problems   Diagnosis Date Noted  . 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 Abdominal distention 01/17/2015  . Dilutional hyponatremia (HCC) 01/17/2015  . Ascites   . Coagulopathy (HCC)   . Primary sclerosing cholangitis   . 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 Hypoalbuminemia 06/04/2014  . Jaundice   . Hypokalemia   . Autoimmune hepatitis (HCC)   . Protein-calorie malnutrition, severe (HCC) 01/13/2014  . Abdominal pain, acute 01/12/2014  . Anemia     Resolved Hospital Problems   Diagnosis Date Noted Date Resolved  No resolved problems to display.    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 In the emergency room, patient noted to be hyponatremic with sodium of 124, hypokalemic with a potassium of 2.7 and a bilirubin of 7.0. She is also noted to have a distended abdomen large amount of ascites. CT of the abdomen and pelvis noted a nonocclusive superior mesenteric vein thrombosis. Patient underwent a paracentesis removing 2 L. Hospitalists were called for further evaluation. Noted to have an elevated lactic acid level 2.2  Hospital Course:  Principal Problem:   Superior  mesenteric vein thrombosis (HCC): Appears to be a new issue. Patient is extremely high risk of anticoagulation given history of GI bleed, results of esophageal varices, coagulopathy secondary to liver disease. GI was consulted as was general surgery. Given patient's extremely complicated history as well as concerns for possible terminal prognosis, GI recommended no anticoagulation at this time, keeping her on clear liquids and if stable, discharged with follow-up at [facility]. Patient was evaluated by general surgery who did not feel she was a surgical candidate. He lactic acid level increased, although patient appears to be clinically stable and I do not think that this is signs of worsening infection. Rather, decreased clearance due to liver disease as well as continued dehydration.  Active Problems:   Anemia   Abdominal pain, acute: Multifactorial. Apparently patient has had issues with abdominal pain and poor by mouth intake chronically. Question if this is related to her superior mesenteric vein thrombosis. Patient's lactic acid level not felt to be secondary to gut ischemia and more from hypovolemia. Paracentesis fluid evaluated, negative for SVT.    Protein-calorie malnutrition, severe (HCC): Patient criteria in the context of chronic illness. If we only to worsen with poor by mouth intake. Patient was discharged before being seen by nutrition.   Primary sclerosing cholangitis and Autoimmune hepatitis (HCC) with secondary nonalcoholic cirrhosis, hypoalbuminemia, coagulopathy: Based off of [facility] notes. Patient is not felt to be a candidate for transplant. If that is the case, then likely she is terminal given worsening bilirubin and closer to full on end-stage liver disease. She hasn't appointment for endoscopy on 12/22 and recommendation as per GIs for her to keep that  appointment and at that time [facility] may opt to sit down informally discuss this as patient states that she is unclear about whether or not  she is supposed to get a transplant.   Hypokalemia: Secondary to nausea and vomiting. Replaced    Abdominal distention   Dilutional hyponatremia (HCC)   Procedures:  Paracentesis done 12/20:2 L removed   Consultations: General surgery Gastroenterology  Discharge Exam: BP 118/62 mmHg  Pulse 114  Temp(Src) 98.4 F (36.9 C) (Oral)  Resp 20  Ht 4\' 11"  (1.499 m)  Wt 43.7 kg (96 lb 5.5 oz)  BMI 19.45 kg/m2  SpO2 93%  General: alert and oriented 3, patient appears to have some overall mental symptoms with which she  Cardiovascular: regular rate and rhythm, S1-S2  Respiratory: to auscultation bilaterally Abdomen: Soft, mild distention, hypoactive bowel sounds   Discharge Instructions You were cared for by a hospitalist during your hospital stay. If you have any questions about your discharge medications or the care you received while you were in the hospital after you are discharged, you can call the unit and asked to speak with the hospitalist on call if the hospitalist that took care of you is not available. Once you are discharged, your primary care physician will handle any further medical issues. Please note that NO REFILLS for any discharge medications will be authorized once you are discharged, as it is imperative that you return to your primary care physician (or establish a relationship with a primary care physician if you do not have one) for your aftercare needs so that they can reassess your need for medications and monitor your lab values.  Discharge Instructions    Ice chips    Complete by:  As directed      Increase activity slowly    Complete by:  As directed             Medication List    STOP taking these medications        rifaximin 550 MG Tabs tablet  Commonly known as:  XIFAXAN      TAKE these medications        azaTHIOprine 50 MG tablet  Commonly known as:  IMURAN  Take 25 mg by mouth daily.     carvedilol 3.125 MG tablet  Commonly known as:   COREG  Take 3.125 mg by mouth 2 (two) times daily with a meal.     furosemide 20 MG tablet  Commonly known as:  LASIX  Take 2 tablets (40 mg total) by mouth daily.     lactulose 10 GM/15ML solution  Commonly known as:  CHRONULAC  Take 30 mLs (20 g total) by mouth 2 (two) times daily.     lidocaine 2 % solution  Commonly known as:  XYLOCAINE  Use as directed 15 mLs in the mouth or throat every 4 (four) hours as needed for mouth pain.     metoCLOPramide 5 MG tablet  Commonly known as:  REGLAN  Take 5 mg by mouth 2 (two) times daily.     omeprazole 40 MG capsule  Commonly known as:  PRILOSEC  Take 40 mg by mouth daily.     ondansetron 4 MG tablet  Commonly known as:  ZOFRAN  Take 1 tablet (4 mg total) by mouth every 8 (eight) hours as needed for nausea or vomiting.     oxyCODONE 5 MG immediate release tablet  Commonly known as:  ROXICODONE  Take 1 tablet (5 mg total) by  mouth every 4 (four) hours as needed for severe pain.     predniSONE 5 MG tablet  Commonly known as:  DELTASONE  Take 5 mg by mouth daily with breakfast.     spironolactone 50 MG tablet  Commonly known as:  ALDACTONE  Take 150 mg by mouth daily.     sulfamethoxazole-trimethoprim 800-160 MG tablet  Commonly known as:  BACTRIM DS,SEPTRA DS  Take 1 tablet by mouth 2 (two) times daily.     ursodiol 300 MG capsule  Commonly known as:  ACTIGALL  Take 300 mg by mouth 2 (two) times daily.     valGANciclovir 450 MG tablet  Commonly known as:  VALCYTE  Take 2 tablets (900 mg total) by mouth 2 (two) times daily.       Allergies  Allergen Reactions  . Nsaids     Can not take because of Liver  . Tylenol [Acetaminophen]     Can not take because of Liver  . 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   Specialty:  Emergency Medicine   Why:  As needed, If symptoms worsen   Contact information:   [address] [id number] mc [place] [address] [phone]       The results of significant diagnostics from this hospitalization (including imaging, microbiology, ancillary and laboratory) are listed below for reference.    Significant Diagnostic Studies: Ct Abdomen Pelvis W Contrast  01/17/2015  CLINICAL DATA:  Hepatitis-C, cirrhosis, nausea, mid abdominal pain EXAM: CT ABDOMEN AND PELVIS WITH CONTRAST TECHNIQUE: Multidetector CT imaging of the abdomen and pelvis was performed using the standard protocol following bolus administration of intravenous contrast. CONTRAST:  70mL OMNIPAQUE IOHEXOL 300 MG/ML  SOLN COMPARISON:  12/23/2014 FINDINGS: Sagittal images of the spine are unremarkable. There is significant abdominal ascites. Large perihepatic and perisplenic ascites. Large ascites noted bilateral paracolic gutters. Large pelvic ascites. Again noted atrophic liver with nodular contour consistent with cirrhosis. Significant splenomegaly. Splenic infarcts are poorly visualized. Again noted portal venous hypertension with prominent size splenic vein and varices in upper abdomen. 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 There is no small bowel obstruction. No free abdominal air. Recanalized paraumbilical vein again noted. Again noted periesophageal and gastrohepatic ligament varices. Left ovarian vein varices are again noted. There is  no small bowel obstruction. No aortic aneurysm. No free abdominal air. The pancreas and adrenal glands are unremarkable. Kidneys are symmetrical in size and enhancement. No hydronephrosis or hydroureter. Small umbilical hernia containing fluid. Small uterus and ovaries again noted. The uterus is anteflexed. Moderate stool noted within sigmoid colon and rectum. No distal colonic obstruction. There is no inguinal adenopathy. Mild distended urinary bladder. No destructive bony lesions are noted within pelvis. IMPRESSION: 1. Again noted large abdominal ascites and changes of hepatic cirrhosis with portal hypertension. Multiple upper abdominal  varices and significant splenomegaly again noted. 2. 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 3. Large amount of pelvic ascites. No hydronephrosis or hydroureter. 4. No small bowel or colonic obstruction. Moderate stool noted within sigmoid colon and rectum. 5. Mild distended urinary bladder. Electronically Signed   By: Natasha Mead M.D.   On: 01/17/2015 15:52   Ct Abdomen Pelvis W Contrast  12/23/2014  CLINICAL DATA:  Chronic liver and spleen disease. Ascites and multiple STD ease. Nausea and vomiting, diffuse abdominal pain, and tenderness with palpation. EXAM: CT ABDOMEN AND PELVIS WITH CONTRAST TECHNIQUE: Multidetector CT imaging of the abdomen and pelvis was performed using the standard protocol following bolus administration of intravenous contrast. CONTRAST:  OMNIPAQUE IOHEXOL 300 MG/ML  SOLN COMPARISON:  Ultrasound abdomen 12/06/2014. CT abdomen and pelvis 11/20/2014. FINDINGS: Lung bases are clear. Changes of hepatic cirrhosis with diffuse hepatic atrophy, enlargement of the caudate lobe, and nodular contour. Spleen is diffusely enlarged with multiple peripheral wedge-shaped infarcts. Flow is demonstrated in the portal veins and mesenteric veins with evidence of portal venous hypertension including portal since  in make shunting and prominent varices throughout the upper abdomen, including periesophageal, gastrohepatic ligament, splenic vein, and left gonadal vein varices. Recanalized paraumbilical veins. Large amount of abdominal and pelvic ascites. Gallbladder is mildly distended without obvious wall thickening or stone. No bile duct dilatation. Small bowel demonstrates diffuse wall and fold thickening and hyperemia likely due indicate hypoproteinemia and ascites. Diffuse wall thickening and edema of the colon which may indicate colitis. Small umbilical hernia containing fat and fluid. Overall, appearances are similar to previous study. The kidneys, adrenal glands, abdominal aorta, inferior vena cava, and retroperitoneal lymph nodes are unremarkable. No free air in the abdomen. Pelvis: Large amount of pelvic ascites. Bladder wall is not thickened. Appendix is not identified. Uterus and ovaries are not enlarged. No pelvic mass or lymphadenopathy. No destructive bone lesions. IMPRESSION: Changes of hepatic cirrhosis and portal venous hypertension with splenic enlargement, multiple splenic infarcts, diffuse upper abdominal varices, recanalization of paraumbilical veins, and prominent abdominal and pelvic ascites. Wall/ fold thickening and hyperemia of small bowel consistent with hypoproteinemia or effective ascites. Colonic wall thickening may indicate colitis. Small umbilical hernia containing fat and fluid. Appearances are similar to previous study. Electronically Signed   By: Burman Nieves M.D.   On: 12/23/2014 20:34   US Paracentesis  01/11/2015  CLINICAL DATA:  History of autoimmune hepatitis and cirrhosis. Refractory ascites. EXAM: ULTRASOUND GUIDED  PARACENTESIS COMPARISON:  12/29/2014 PROCEDURE: An ultrasound guided paracentesis was thoroughly discussed with the patient and questions answered. The benefits, risks, alternatives and complications were also discussed. The patient understands and wishes to proceed  with the procedure. Written consent was obtained. Ultrasound was performed to localize and mark an adequate pocket of fluid in the left lower quadrant quadrant of the abdomen. The area was then prepped and draped in the normal sterile fashion. 1% Lidocaine was used for local anesthesia. Under ultrasound guidance a Safe-T-Centesis catheter was introduced. Paracentesis was performed. The catheter was removed and a dressing applied. COMPLICATIONS: None. FINDINGS: A total of approximately 5.5 L of clear yellow fluid was removed. A fluid sample was sent for laboratory analysis. Estimated blood loss: Minimal IMPRESSION: Successful ultrasound guided paracentesis yielding 5.5 L of ascites. Electronically Signed   By: Richarda Overlie M.D.   On: 01/11/2015 17:24   US Paracentesis  12/29/2014  CLINICAL DATA:  Cirrhosis, primary sclerosing cholangitis and refractory ascites  requiring paracentesis. EXAM: ULTRASOUND GUIDED  PARACENTESIS COMPARISON:  Most recent paracentesis on 12/06/2014 PROCEDURE: An ultrasound guided paracentesis was thoroughly discussed with the patient and questions answered. The benefits, risks, alternatives and complications were also discussed. The patient understands and wishes to proceed with the procedure. Written consent was obtained. Ultrasound was performed to localize and mark an adequate pocket of fluid in the right lower quadrant of the abdomen. The area was then prepped and draped in the normal sterile fashion. 1% Lidocaine was used for local anesthesia. Under ultrasound guidance a 6 French Safe-T-Centesis catheter was introduced. Paracentesis was performed. The catheter was removed and a dressing applied. COMPLICATIONS: None. FINDINGS: A total of approximately 5.3 L of clear, yellow fluid was removed. IMPRESSION: Successful ultrasound guided paracentesis yielding 5.3 L of ascites. Electronically Signed   By: Irish Lack M.D.   On: 12/29/2014 16:58    Microbiology: Recent Results (from the  past 240 hour(s))  Body fluid culture     Status: None   Collection Time: 01/11/15  2:59 PM  Result Value Ref Range Status   Specimen Description ABDOMEN  Final   Special Requests NONE  Final   Gram Stain MODERATE WBC SEEN NO ORGANISMS SEEN   Final   Culture No growth aerobically or anaerobically.  Final   Report Status 01/15/2015 FINAL  Final  Culture, blood (Routine X 2) w Reflex to ID Panel     Status: None (Preliminary result)   Collection Time: 01/17/15  5:50 AM  Result Value Ref Range Status   Specimen Description BLOOD LEFT ARM  Final   Special Requests BOTTLES DRAWN AEROBIC AND ANAEROBIC  Final   Culture NO GROWTH 1 DAY  Final   Report Status PENDING  Incomplete  Culture, blood (Routine X 2) w Reflex to ID Panel     Status: None (Preliminary result)   Collection Time: 01/17/15  5:54 AM  Result Value Ref Range Status   Specimen Description BLOOD RIGHT ARM  Final   Special Requests AEROBIC BOTTLE ONLY  Final   Culture NO GROWTH 1 DAY  Final   Report Status PENDING  Incomplete  Culture, body fluid-bottle     Status: None (Preliminary result)   Collection Time: 01/17/15  9:56 AM  Result Value Ref Range Status   Specimen Description FLUID ABDOMEN ASCITIC  Final   Special Requests BOTTLES DRAWN AEROBIC AND ANAEROBIC  Final   Culture NO GROWTH 1 DAY  Final   Report Status PENDING  Incomplete  Gram stain     Status: None   Collection Time: 01/17/15  9:56 AM  Result Value Ref Range Status   Specimen Description FLUID ABDOMEN ASCITIC  Final   Special Requests NONE  Final   Gram Stain   Final    MODERATE WBC PRESENT,BOTH PMN AND MONONUCLEAR NO ORGANISMS SEEN    Report Status 01/17/2015 FINAL  Final     Labs: Basic Metabolic Panel:  Recent Labs Lab 01/17/15 0545 01/18/15 0607  NA 124* 128*  K 2.7* 3.4*  CL 89* 96*  CO2 25 23  GLUCOSE 117* 108*  BUN 7 8  CREATININE 0.77 0.75  CALCIUM 8.0* 8.7*   Liver Function Tests:  Recent Labs Lab  01/17/15 0545 01/18/15 0607  AST 44* 49*  ALT 31 33  ALKPHOS 395* 350*  BILITOT 7.0* 11.6*  PROT 5.5* 5.4*  ALBUMIN 2.7* 2.4*    Recent Labs Lab 01/17/15 0545  LIPASE 61*   No results  for input(s): AMMONIA in the last 168 hours. CBC:  Recent Labs Lab 01/17/15 0545 01/18/15 0607  WBC 5.7 5.1  NEUTROABS 3.3  --   HGB 10.0* 9.3*  HCT 28.9* 28.1*  MCV 98.6 100.4*  PLT 127* 119*   Cardiac Enzymes: No results for input(s): CKTOTAL, CKMB, CKMBINDEX, TROPONINI in the last 168 hours. BNP: BNP (last 3 results) No results for input(s): BNP in the last 8760 hours.  ProBNP (last 3 results) No results for input(s): PROBNP in the last 8760 hours.  CBG:  Recent Labs Lab 01/17/15 1036  GLUCAP 123*       Signed:  Hollice Espy  [facility] 01/18/2015, 4:59 PM

## 2015-01-19 LAB — PATHOLOGIST SMEAR REVIEW

## 2015-01-22 ENCOUNTER — Encounter (HOSPITAL_COMMUNITY): Payer: Self-pay | Admitting: Emergency Medicine

## 2015-01-22 ENCOUNTER — Emergency Department (HOSPITAL_COMMUNITY)
Admission: EM | Admit: 2015-01-22 | Discharge: 2015-01-22 | Disposition: A | Discharge status: Home / Self Care | Payer: BC Managed Care – PPO | Attending: Physician Assistant | Admitting: Physician Assistant

## 2015-01-22 DIAGNOSIS — Z8679 Personal history of other diseases of the circulatory system: Secondary | ICD-10-CM | POA: Insufficient documentation

## 2015-01-22 DIAGNOSIS — Z792 Long term (current) use of antibiotics: Secondary | ICD-10-CM | POA: Insufficient documentation

## 2015-01-22 DIAGNOSIS — Z3202 Encounter for pregnancy test, result negative: Secondary | ICD-10-CM | POA: Insufficient documentation

## 2015-01-22 DIAGNOSIS — Z9884 Bariatric surgery status: Secondary | ICD-10-CM | POA: Diagnosis not present

## 2015-01-22 DIAGNOSIS — Z862 Personal history of diseases of the blood and blood-forming organs and certain disorders involving the immune mechanism: Secondary | ICD-10-CM | POA: Insufficient documentation

## 2015-01-22 DIAGNOSIS — Z8719 Personal history of other diseases of the digestive system: Secondary | ICD-10-CM | POA: Diagnosis not present

## 2015-01-22 DIAGNOSIS — Z8742 Personal history of other diseases of the female genital tract: Secondary | ICD-10-CM | POA: Diagnosis not present

## 2015-01-22 DIAGNOSIS — Z8659 Personal history of other mental and behavioral disorders: Secondary | ICD-10-CM | POA: Insufficient documentation

## 2015-01-22 DIAGNOSIS — R1013 Epigastric pain: Secondary | ICD-10-CM | POA: Insufficient documentation

## 2015-01-22 DIAGNOSIS — R109 Unspecified abdominal pain: Secondary | ICD-10-CM | POA: Diagnosis present

## 2015-01-22 DIAGNOSIS — Z7952 Long term (current) use of systemic steroids: Secondary | ICD-10-CM | POA: Diagnosis not present

## 2015-01-22 DIAGNOSIS — Z79899 Other long term (current) drug therapy: Secondary | ICD-10-CM | POA: Diagnosis not present

## 2015-01-22 DIAGNOSIS — Z8619 Personal history of other infectious and parasitic diseases: Secondary | ICD-10-CM | POA: Insufficient documentation

## 2015-01-22 DIAGNOSIS — Z87891 Personal history of nicotine dependence: Secondary | ICD-10-CM | POA: Insufficient documentation

## 2015-01-22 DIAGNOSIS — Z880 Allergy status to penicillin: Secondary | ICD-10-CM | POA: Insufficient documentation

## 2015-01-22 LAB — CBC WITH DIFFERENTIAL/PLATELET
BASOS PCT: 0 %
Basophils Absolute: 0 10*3/uL (ref 0.0–0.1)
EOS ABS: 0 10*3/uL (ref 0.0–0.7)
Eosinophils Relative: 1 %
HCT: 29.5 % — ABNORMAL LOW (ref 36.0–46.0)
HEMOGLOBIN: 10.1 g/dL — AB (ref 12.0–15.0)
Lymphocytes Relative: 10 %
Lymphs Abs: 0.6 10*3/uL — ABNORMAL LOW (ref 0.7–4.0)
MCH: 34.4 pg — ABNORMAL HIGH (ref 26.0–34.0)
MCHC: 34.2 g/dL (ref 30.0–36.0)
MCV: 100.3 fL — ABNORMAL HIGH (ref 78.0–100.0)
MONOS PCT: 19 %
Monocytes Absolute: 1.2 10*3/uL — ABNORMAL HIGH (ref 0.1–1.0)
NEUTROS PCT: 70 %
Neutro Abs: 4.4 10*3/uL (ref 1.7–7.7)
Platelets: 120 10*3/uL — ABNORMAL LOW (ref 150–400)
RBC: 2.94 MIL/uL — AB (ref 3.87–5.11)
RDW: 16.2 % — ABNORMAL HIGH (ref 11.5–15.5)
WBC: 6.2 10*3/uL (ref 4.0–10.5)

## 2015-01-22 LAB — CULTURE, BLOOD (ROUTINE X 2)
CULTURE: NO GROWTH
CULTURE: NO GROWTH

## 2015-01-22 LAB — PROTEIN, BODY FLUID

## 2015-01-22 LAB — GLUCOSE, PERITONEAL FLUID: GLUCOSE, PERITONEAL FLUID: 116 mg/dL

## 2015-01-22 LAB — MAGNESIUM: Magnesium: 1.8 mg/dL (ref 1.7–2.4)

## 2015-01-22 LAB — CULTURE, BODY FLUID-BOTTLE: CULTURE: NO GROWTH

## 2015-01-22 LAB — LACTATE DEHYDROGENASE, PLEURAL OR PERITONEAL FLUID: LD, Fluid: 11 U/L (ref 3–23)

## 2015-01-22 LAB — I-STAT TROPONIN, ED: Troponin i, poc: 0 ng/mL (ref 0.00–0.08)

## 2015-01-22 LAB — BASIC METABOLIC PANEL
Anion gap: 9 (ref 5–15)
BUN: 8 mg/dL (ref 6–20)
CO2: 23 mmol/L (ref 22–32)
Calcium: 8.5 mg/dL — ABNORMAL LOW (ref 8.9–10.3)
Chloride: 96 mmol/L — ABNORMAL LOW (ref 101–111)
Creatinine, Ser: 0.55 mg/dL (ref 0.44–1.00)
GFR calc Af Amer: >60 mL/min (ref >60)
GFR calc non Af Amer: >60 mL/min (ref >60)
Glucose, Bld: 111 mg/dL — ABNORMAL HIGH (ref 65–99)
Potassium: 3.1 mmol/L — ABNORMAL LOW (ref 3.5–5.1)
Sodium: 128 mmol/L — ABNORMAL LOW (ref 135–145)

## 2015-01-22 LAB — I-STAT BETA HCG BLOOD, ED (MC, WL, AP ONLY): I-stat hCG, quantitative: <5 m[IU]/mL (ref <5)

## 2015-01-22 LAB — ETHANOL: Alcohol, Ethyl (B): <5 mg/dL (ref <5)

## 2015-01-22 LAB — ACETAMINOPHEN LEVEL

## 2015-01-22 LAB — ALBUMIN, FLUID (OTHER)

## 2015-01-22 LAB — CULTURE, BODY FLUID W GRAM STAIN -BOTTLE

## 2015-01-22 LAB — SALICYLATE LEVEL: SALICYLATE LVL: 5 mg/dL (ref 2.8–30.0)

## 2015-01-22 MED ORDER — FENTANYL CITRATE (PF) 100 MCG/2ML IJ SOLN
25.0000 ug | Freq: Once | INTRAMUSCULAR | Status: AC
  Administered 2015-01-22: 25 ug via INTRAVENOUS
  Filled 2015-01-22: qty 2

## 2015-01-22 MED ORDER — SODIUM CHLORIDE 0.9 % IV BOLUS (SEPSIS)
1000.0000 mL | Freq: Once | INTRAVENOUS | Status: AC
  Administered 2015-01-22: 1000 mL via INTRAVENOUS

## 2015-01-22 MED ORDER — ONDANSETRON HCL 4 MG PO TABS: 4.0000 mg | ORAL_TABLET | Freq: Three times a day (TID) | ORAL | Status: AC | PRN

## 2015-01-22 MED ORDER — SUCRALFATE 1 GM/10ML PO SUSP
1.0000 g | Freq: Once | ORAL | Status: AC
  Administered 2015-01-22: 1 g via ORAL
  Filled 2015-01-22: qty 10

## 2015-01-22 MED ORDER — LIDOCAINE HCL (PF) 1 % IJ SOLN
5.0000 mL | Freq: Once | INTRAMUSCULAR | Status: AC
  Administered 2015-01-22: 5 mL
  Filled 2015-01-22: qty 5

## 2015-01-22 MED ORDER — POTASSIUM CHLORIDE CRYS ER 20 MEQ PO TBCR
40.0000 meq | EXTENDED_RELEASE_TABLET | Freq: Once | ORAL | Status: AC
  Administered 2015-01-22: 40 meq via ORAL
  Filled 2015-01-22: qty 2

## 2015-01-22 MED ORDER — OXYCODONE HCL 5 MG PO TABS: 5.0000 mg | ORAL_TABLET | ORAL | Status: DC | PRN

## 2015-01-22 NOTE — Discharge Instructions (Signed)
Please use mylanta and zofran to help with your pain.  Please return with fever or other concerns.   Abdominal Pain, Adult Many things can cause abdominal pain. Usually, abdominal pain is not caused by a disease and will improve without treatment. It can often be observed and treated at home. Your health care provider will do a physical exam and possibly order blood tests and X-rays to help determine the seriousness of your pain. However, in many cases, more time must pass before a clear cause of the pain can be found. Before that point, your health care provider may not know if you need more testing or further treatment. HOME CARE INSTRUCTIONS Monitor your abdominal pain for any changes. The following actions may help to alleviate any discomfort you are experiencing:  Only take over-the-counter or prescription medicines as directed by your health care provider.  Do not take laxatives unless directed to do so by your health care provider.  Try a clear liquid diet (broth, tea, or water) as directed by your health care provider. Slowly move to a bland diet as tolerated. SEEK MEDICAL CARE IF:  You have unexplained abdominal pain.  You have abdominal pain associated with nausea or diarrhea.  You have pain when you urinate or have a bowel movement.  You experience abdominal pain that wakes you in the night.  You have abdominal pain that is worsened or improved by eating food.  You have abdominal pain that is worsened with eating fatty foods.  You have a fever. SEEK IMMEDIATE MEDICAL CARE IF:  Your pain does not go away within 2 hours.  You keep throwing up (vomiting).  Your pain is felt only in portions of the abdomen, such as the right side or the left lower portion of the abdomen.  You pass bloody or black tarry stools. MAKE SURE YOU:  Understand these instructions.  Will watch your condition.  Will get help right away if you are not doing well or get worse.   This  information is not intended to replace advice given to you by your health care provider. Make sure you discuss any questions you have with your health care provider.   Document Released: 10/24/2004 Document Revised: 10/05/2014 Document Reviewed: 09/23/2012 Elsevier Interactive Patient Education [employer]2016 [employer]

## 2015-01-22 NOTE — ED Provider Notes (Signed)
CSN: [id number]     Arrival date & time 01/22/15  0541 History   First MD Initiated Contact with Patient 01/22/15 [phone]     Chief Complaint  Patient presents with  . Drug Overdose     (Consider location/radiation/quality/duration/timing/severity/associated sxs/prior Treatment) HPI   Patient is a 24 year old female with history of autoimmune hepatitis, GI bleeds, cirrhosis, esophageal varices presenting today with acute onset of abdominal pain. Patient was seen for the same thing 4 days ago. At that point she had 2 L of fluid drained from her ascites and sent for culture. It was negative at that time. Patient denies any fever today. She says she had pain starting at 2:56 in the morning. This is exactly the same presentation as prior. She tried to take liquid lidocaine. She took 2 doses and it did not help.  Past Medical History  Diagnosis Date  . Anemia 2012  . Esophageal varices (HCC) 2015    s/p multiple EGDs and bandings dating to 2015.   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 GIB (gastrointestinal bleeding)   . Cirrhosis (HCC) 2003  . Irregular periods 08/04/2014  . Vaginal Pap smear, abnormal   . Hepatic encephalopathy (HCC)   . Coagulopathy (HCC)   . STD (female)     mutiple. risky sexual behavior.   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 Ascites 2003  . Psychosis    Past Surgical History  Procedure Laterality Date  . Gastric banding port revision    . Tonsillectomy    . Esophagogastroduodenoscopy  07/22/2011    Procedure: ESOPHAGOGASTRODUODENOSCOPY (EGD);  Surgeon: Graylin Shiver, MD;  Location: [facility] ENDOSCOPY;  Service: Endoscopy;  Laterality: N/A;  . Esophagogastroduodenoscopy N/A 09/11/2014    Procedure: ESOPHAGOGASTRODUODENOSCOPY (EGD);  Surgeon: Charlott Rakes, MD;  Location: [facility] ENDOSCOPY;  Service: Endoscopy;  Laterality: N/A;   Family History  Problem Relation Age of Onset  . Adopted: Yes   Social History   Substance Use Topics  . Smoking status: Former Smoker -- 0.50 packs/day for 8 years    Types: Cigarettes  . Smokeless tobacco: Never Used  . Alcohol Use: No   OB History    Gravida Para Term Preterm AB TAB SAB Ectopic Multiple Living       Review of Systems  Constitutional: Negative for fever and activity change.  HENT: Negative for congestion.   Respiratory: Negative for shortness of breath.   Cardiovascular: Negative for chest pain.  Gastrointestinal: Positive for abdominal pain. Negative for nausea and diarrhea.  Neurological: Negative for dizziness.  Psychiatric/Behavioral: Negative for agitation.      Allergies  Nsaids; Tylenol; and Amoxicillin  Home Medications   Prior to Admission medications   Medication Sig Start Date End Date Taking? Authorizing Provider  azaTHIOprine (IMURAN) 50 MG tablet Take 25 mg by mouth daily.    Yes Historical Provider, MD  carvedilol (COREG) 3.125 MG tablet Take 3.125 mg by mouth 2 (two) times daily with a meal.    Yes Historical Provider, MD  furosemide (LASIX) 20 MG tablet Take 2 tablets (40 mg total) by mouth daily. Patient taking differently: Take 20 mg by mouth 2 (two) times daily.  10/12/14  Yes Shanker Levora Dredge, MD  lactulose (CHRONULAC) 10 GM/15ML solution Take 30 mLs (20 g total) by mouth 2 (two) times daily. Patient taking differently: Take 20 g by mouth 3 (three)  times daily.  09/13/14  Yes Vilinda Blanks Minor, NP  lidocaine (XYLOCAINE) 2 % solution Use as directed 15 mLs in the mouth or throat every 4 (four) hours as needed for mouth pain.   Yes Historical Provider, MD  metoCLOPramide (REGLAN) 5 MG tablet Take 5 mg by mouth 2 (two) times daily.    Yes Historical Provider, MD  omeprazole (PRILOSEC) 40 MG capsule Take 40 mg by mouth daily.   Yes Historical Provider, MD  predniSONE (DELTASONE) 5 MG tablet Take 5 mg by mouth daily with breakfast.   Yes Historical Provider, MD  spironolactone (ALDACTONE) 50 MG tablet  Take 150 mg by mouth daily.   Yes Historical Provider, MD  sulfamethoxazole-trimethoprim (BACTRIM DS,SEPTRA DS) 800-160 MG tablet Take 1 tablet by mouth 2 (two) times daily.   Yes Historical Provider, MD  ursodiol (ACTIGALL) 300 MG capsule Take 300 mg by mouth 2 (two) times daily.    Yes Historical Provider, MD  ondansetron (ZOFRAN) 4 MG tablet Take 1 tablet (4 mg total) by mouth every 8 (eight) hours as needed for nausea or vomiting. Patient not taking: Reported on 11/23/2014 10/12/14   Maretta Bees, MD  oxyCODONE (ROXICODONE) 5 MG immediate release tablet Take 1 tablet (5 mg total) by mouth every 4 (four) hours as needed for severe pain. Patient not taking: Reported on 12/23/2014 11/17/14   Loren Racer, MD  valGANciclovir (VALCYTE) 450 MG tablet Take 2 tablets (900 mg total) by mouth 2 (two) times daily. Patient not taking: Reported on 01/11/2015 10/12/14   Maretta Bees, MD   BP 135/98 mmHg  Pulse 117  Temp(Src) 97.7 F (36.5 C) (Axillary)  Resp 20  SpO2 98% Physical Exam  Constitutional: She is oriented to person, place, and time. She appears well-developed and well-nourished.  Tearful   HENT:  Head: Normocephalic and atraumatic.  Eyes: Conjunctivae are normal. Right eye exhibits no discharge.  Neck: Neck supple.  Cardiovascular: Normal rate, regular rhythm and normal heart sounds.   No murmur heard. Pulmonary/Chest: Effort normal and breath sounds normal. She has no wheezes. She has no rales.  Abdominal: Soft. She exhibits no distension. There is tenderness.  Cirrhotic abdomen  Musculoskeletal: Normal range of motion. She exhibits no edema.  Neurological: She is oriented to person, place, and time. No cranial nerve deficit.  Skin: Skin is warm and dry. No rash noted. She is not diaphoretic.  Psychiatric: She has a normal mood and affect.  Nursing note and vitals reviewed.   ED Course  .Paracentesis Date/Time: 01/22/2015 8:47 AM Performed by: Bary Castilla  LYN Authorized by: Bary Castilla LYN Consent: Verbal consent obtained. Risks and benefits: risks, benefits and alternatives were discussed Consent given by: patient Patient understanding: patient states understanding of the procedure being performed Patient identity confirmed: verbally with patient, arm band and hospital-assigned identification number Time out: Immediately prior to procedure a "time out" was called to verify the correct patient, procedure, equipment, support staff and site/side marked as required. Procedure purpose: diagnostic Indications: suspected peritonitis Anesthesia: local infiltration Local anesthetic: lidocaine 1% with epinephrine Anesthetic total: 2 ml Patient sedated: no Preparation: Patient was prepped and draped in the usual sterile fashion. Needle gauge: 18 Ultrasound guidance: yes Puncture site: right lower quadrant Fluid removed: 20(ml) Fluid appearance: clear Dressing: 4x4 sterile gauze Patient tolerance: Patient tolerated the procedure well with no immediate complications   (including critical care time) Labs Review Labs Reviewed  CBC WITH DIFFERENTIAL/PLATELET - Abnormal; Notable for the following:  RBC 2.94 (*)    Hemoglobin 10.1 (*)    HCT 29.5 (*)    MCV 100.3 (*)    MCH 34.4 (*)    RDW 16.2 (*)    Platelets 120 (*)    Lymphs Abs 0.6 (*)    Monocytes Absolute 1.2 (*)    All other components within normal limits  BODY FLUID CULTURE  BASIC METABOLIC PANEL  ACETAMINOPHEN LEVEL  SALICYLATE LEVEL  MAGNESIUM  ETHANOL  LACTATE DEHYDROGENASE, BODY FLUID  GLUCOSE, PERITONEAL FLUID  PROTEIN, BODY FLUID  ALBUMIN, FLUID  I-STAT BETA HCG BLOOD, ED ([facility], [facility], [facility] ONLY)    Imaging Review No results found. I have personally reviewed and evaluated these images and lab results as part of my medical decision-making.   EKG Interpretation   Date/Time:  Sunday January 22 2015 07:54:57 EST Ventricular Rate:  107 PR Interval:  149 QRS  Duration: 105 QT Interval:  364 QTC Calculation: 486 R Axis:   25 Text Interpretation:  Sinus tachycardia ST elevation, consider inferior  injury Borderline prolonged QT interval no acute ischemia No significant  change since last tracing QTC 486 Confirmed by Corlis LeakMACKUEN, COURTNEY ([id number])  on 01/22/2015 8:07:21 AM      MDM   Final diagnoses:  None    Patient is a pleasant 24 year old female with autoimmune hepatitis presenting with acute on chronic abdominal pain. Patient has cirrhosis and has had multiple paracentesis in the past. She has scheduled paracentesis every 2 weeks. She last had 2 L taken off 5 days ago in our emergency department. Today she has tenderness to her abdomen. We will draw a sibling fluid was sent for culture. Otherwise patient's symptoms were consistent with gastritis or ulcer disease. It is sharp, epigastric. Worse when she ate chicken at 5 am. Patient reports that she had it scoped by GI recently. She does not remember the name of her GI physician. However looking in care everywhere it appears she had an upper GI scope done within the last 5 days. It showed evidence of old banding sites but no active bleeding or varices. We will treat symptomatically control patient's pain, rule out any infection and likely be able to discharge home.    Patient wanted multiple liters taken off, but she has no SOB and had 2 L off this week already, abdomen is not tense.    I think this pain is chronic and her usual pain. Will rule out SBP and discharge home.   Do not suspect lidocaine toxicity as she only took about 5 cc extra of her lidocaine solution. No symptoms of toxicity.   8:47 AM Paracentesis completed.   12:24 PM Results returned paracentesis. All normal. Patient still has no fever. She is eating normally. She is asking for prescription for oxycodone with refills. I do not feel comfortable prescribing this at this time. However we will give her couple days of oxycodone to  follow up with her primary care physician. Patient appears no discrete distress and is looking forward to spending Christmas with her nieces and nephews at home. She has strict return precautions any fever vomiting or abdominal pain.  Courteney Randall AnLyn Mackuen, MD 01/22/15 1229

## 2015-01-22 NOTE — ED Notes (Signed)
Brought in by [facility] from home with c/o drug overdose.  Pt reports that she "took an extra dose of her lidocaine oral prescription".  Pt reports that she was prescribed Lidocaine 2% solution 10 ml 3x a day.  This morning, pt has had increased pain to abdomen and so she took an unknown amount of additional Lidocaine.  Pt called [facility] "because she is now feeling lethargic".

## 2015-01-22 NOTE — ED Notes (Signed)
MD at bedside for paracentesis

## 2015-01-22 NOTE — ED Notes (Signed)
Bed: WA09 Expected date:  Expected time:  Means of arrival:  Comments: Ems-overdose

## 2015-01-23 ENCOUNTER — Emergency Department (HOSPITAL_COMMUNITY)
Admission: EM | Admit: 2015-01-23 | Discharge: 2015-01-23 | Disposition: A | Discharge status: Home / Self Care | Payer: BC Managed Care – PPO | Attending: Emergency Medicine | Admitting: Emergency Medicine

## 2015-01-23 ENCOUNTER — Encounter (HOSPITAL_COMMUNITY): Payer: Self-pay | Admitting: Emergency Medicine

## 2015-01-23 DIAGNOSIS — Z880 Allergy status to penicillin: Secondary | ICD-10-CM | POA: Diagnosis not present

## 2015-01-23 DIAGNOSIS — M545 Low back pain, unspecified: Secondary | ICD-10-CM

## 2015-01-23 DIAGNOSIS — Z79899 Other long term (current) drug therapy: Secondary | ICD-10-CM | POA: Insufficient documentation

## 2015-01-23 DIAGNOSIS — Z8659 Personal history of other mental and behavioral disorders: Secondary | ICD-10-CM | POA: Diagnosis not present

## 2015-01-23 DIAGNOSIS — R188 Other ascites: Secondary | ICD-10-CM | POA: Insufficient documentation

## 2015-01-23 DIAGNOSIS — Z8619 Personal history of other infectious and parasitic diseases: Secondary | ICD-10-CM | POA: Diagnosis not present

## 2015-01-23 DIAGNOSIS — R000 Tachycardia, unspecified: Secondary | ICD-10-CM | POA: Diagnosis not present

## 2015-01-23 DIAGNOSIS — Z87891 Personal history of nicotine dependence: Secondary | ICD-10-CM | POA: Diagnosis not present

## 2015-01-23 DIAGNOSIS — Z7952 Long term (current) use of systemic steroids: Secondary | ICD-10-CM | POA: Diagnosis not present

## 2015-01-23 DIAGNOSIS — Z862 Personal history of diseases of the blood and blood-forming organs and certain disorders involving the immune mechanism: Secondary | ICD-10-CM | POA: Insufficient documentation

## 2015-01-23 DIAGNOSIS — Z8742 Personal history of other diseases of the female genital tract: Secondary | ICD-10-CM | POA: Diagnosis not present

## 2015-01-23 DIAGNOSIS — Z8679 Personal history of other diseases of the circulatory system: Secondary | ICD-10-CM | POA: Insufficient documentation

## 2015-01-23 DIAGNOSIS — K7210 Chronic hepatic failure without coma: Secondary | ICD-10-CM | POA: Diagnosis not present

## 2015-01-23 DIAGNOSIS — Z792 Long term (current) use of antibiotics: Secondary | ICD-10-CM | POA: Insufficient documentation

## 2015-01-23 MED ORDER — OXYCODONE HCL 5 MG PO TABS
5.0000 mg | ORAL_TABLET | Freq: Once | ORAL | Status: AC
  Administered 2015-01-23: 5 mg via ORAL
  Filled 2015-01-23: qty 1

## 2015-01-23 NOTE — Discharge Instructions (Signed)
Ascites °Ascites is a collection of excess fluid in the abdomen. Ascites can range from mild to severe. It can get worse without treatment. °CAUSES °Possible causes include: °· Cirrhosis. This is the most common cause of ascites. °· Infection or inflammation in the abdomen. °· Cancer in the abdomen. °· Heart failure. °· Kidney disease. °· Inflammation of the pancreas. °· Clots in the veins of the liver. °SIGNS AND SYMPTOMS °Signs and symptoms may include: °· A feeling of fullness in your abdomen. This is common. °· An increase in the size of your abdomen or your waist. °· Swelling in your legs. °· Swelling of the scrotum in men. °· Difficulty breathing. °· Abdominal pain. °· Sudden weight gain. °If the condition is mild, you may not have symptoms. °DIAGNOSIS °To make a diagnosis, your health care provider will: °· Ask about your medical history. °· Perform a physical exam. °· Order imaging tests, such as an ultrasound or CT scan of your abdomen. °TREATMENT °Treatment depends on the cause of the ascites. It may include: °· Taking a pill to make you urinate. This is called a water pill (diuretic pill). °· Strictly reducing your salt (sodium) intake. Salt can cause extra fluid to be kept in the body, and this makes ascites worse. °· Having a procedure to remove fluid from your abdomen (paracentesis). °· Having a procedure to transfer fluid from your abdomen into a vein. °· Having a procedure that connects two of the major veins within your liver and relieves pressure on your liver (TIPS procedure). °Ascites may go away or improve with treatment of the condition that caused it.  °HOME CARE INSTRUCTIONS °· Keep track of your weight. To do this, weigh yourself at the same time every day and record your weight. °· Keep track of how much you drink and any changes in the amount you urinate. °· Follow any instructions that your health care provider gives you about how much to drink. °· Try not to eat salty (high-sodium)  foods. °· Take medicines only as directed by your health care provider. °· Keep all follow-up visits as directed by your health care provider. This is important. °· Report any changes in your health to your health care provider, especially if you develop new symptoms or your symptoms get worse. °SEEK MEDICAL CARE IF: °· Your gain more than 3 pounds in 3 days. °· Your abdominal size or your waist size increases. °· You have new swelling in your legs. °· The swelling in your legs gets worse. °SEEK IMMEDIATE MEDICAL CARE IF: °· You develop a fever. °· You develop confusion. °· You develop new or worsening difficulty breathing. °· You develop new or worsening abdominal pain. °· You develop new or worsening swelling in the scrotum (in men). °  °This information is not intended to replace advice given to you by your health care provider. Make sure you discuss any questions you have with your health care provider. °  °Document Released: 01/14/2005 Document Revised: 02/04/2014 Document Reviewed: 08/13/2013 °Elsevier Interactive Patient Education ©2016 [employer] ° °

## 2015-01-23 NOTE — ED Provider Notes (Signed)
CSN: [id number]     Arrival date & time 01/23/15  1506 History   First MD Initiated Contact with Patient 01/23/15 1644     Chief Complaint  Patient presents with  . Ascites     (Consider location/radiation/quality/duration/timing/severity/associated sxs/prior Treatment) HPI Patient reports she is having pain in her abdomen and her back. She describes pain in her back is aching in quality. It is central and lower. She reports her abdomen is distended. She denies any vomiting or diarrhea. She has had no fever. Patient has had similar recurrent pain. She reports that she ran out of her oxycodone today. She states that she gets a paracentesis whenever she needs it. She reports she gets paracenteses done at multiple different places. Past Medical History  Diagnosis Date  . Anemia 2012  . Esophageal varices (HCC) 2015    s/p multiple EGDs and bandings dating to 2015.   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 GIB (gastrointestinal bleeding)   . Cirrhosis (HCC) 2003  . Irregular periods 08/04/2014  . Vaginal Pap smear, abnormal   . Hepatic encephalopathy (HCC)   . Coagulopathy (HCC)   . STD (female)     mutiple. risky sexual behavior.   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 Ascites 2003  . Psychosis    Past Surgical History  Procedure Laterality Date  . Gastric banding port revision    . Tonsillectomy    . Esophagogastroduodenoscopy  07/22/2011    Procedure: ESOPHAGOGASTRODUODENOSCOPY (EGD);  Surgeon: Graylin Shiver, MD;  Location: [facility] ENDOSCOPY;  Service: Endoscopy;  Laterality: N/A;  . Esophagogastroduodenoscopy N/A 09/11/2014    Procedure: ESOPHAGOGASTRODUODENOSCOPY (EGD);  Surgeon: Charlott Rakes, MD;  Location: [facility] ENDOSCOPY;  Service: Endoscopy;  Laterality: N/A;   Family History  Problem Relation Age of Onset  . Adopted: Yes   Social History  Substance Use Topics  . Smoking status: Former Smoker -- 0.50 packs/day for 8  years    Types: Cigarettes  . Smokeless tobacco: Never Used  . Alcohol Use: No   OB History    Gravida Para Term Preterm AB TAB SAB Ectopic Multiple Living       Review of Systems  10 Systems reviewed and are negative for acute change except as noted in the HPI.   Allergies  Nsaids; Tylenol; and Amoxicillin  Home Medications   Prior to Admission medications   Medication Sig Start Date End Date Taking? Authorizing Provider  azaTHIOprine (IMURAN) 50 MG tablet Take 25 mg by mouth daily.    Yes Historical Provider, MD  carvedilol (COREG) 3.125 MG tablet Take 3.125 mg by mouth 2 (two) times daily with a meal.    Yes Historical Provider, MD  furosemide (LASIX) 20 MG tablet Take 2 tablets (40 mg total) by mouth daily. Patient taking differently: Take 20 mg by mouth 2 (two) times daily.  10/12/14  Yes Shanker Levora Dredge, MD  lactulose (CHRONULAC) 10 GM/15ML solution Take 30 mLs (20 g total) by mouth 2 (two) times daily. Patient taking differently: Take 20 g by mouth 3 (three) times daily.  09/13/14  Yes Vilinda Blanks Minor, NP  metoCLOPramide (REGLAN) 5 MG tablet Take 5 mg by mouth 2 (two) times daily.    Yes Historical Provider, MD  omeprazole (PRILOSEC) 40 MG capsule Take 40 mg by mouth daily.   Yes Historical Provider, MD  oxyCODONE (ROXICODONE) 5  MG immediate release tablet Take 1 tablet (5 mg total) by mouth every 4 (four) hours as needed for severe pain. 01/22/15  Yes Courteney Lyn Mackuen, MD  predniSONE (DELTASONE) 5 MG tablet Take 5 mg by mouth daily with breakfast.   Yes Historical Provider, MD  spironolactone (ALDACTONE) 50 MG tablet Take 150 mg by mouth daily.   Yes Historical Provider, MD  sulfamethoxazole-trimethoprim (BACTRIM DS,SEPTRA DS) 800-160 MG tablet Take 1 tablet by mouth daily.    Yes Historical Provider, MD  ursodiol (ACTIGALL) 300 MG capsule Take 300 mg by mouth 2 (two) times daily.    Yes Historical Provider, MD  lidocaine (XYLOCAINE) 2 % solution  Use as directed 15 mLs in the mouth or throat every 4 (four) hours as needed for mouth pain.    Historical Provider, MD  ondansetron (ZOFRAN) 4 MG tablet Take 1 tablet (4 mg total) by mouth every 8 (eight) hours as needed for nausea or vomiting. Patient not taking: Reported on 11/23/2014 10/12/14   Maretta BeesShanker M Ghimire, MD  ondansetron (ZOFRAN) 4 MG tablet Take 1 tablet (4 mg total) by mouth every 8 (eight) hours as needed for nausea or vomiting. 01/22/15   Courteney Lyn Mackuen, MD  oxyCODONE (ROXICODONE) 5 MG immediate release tablet Take 1 tablet (5 mg total) by mouth every 4 (four) hours as needed for severe pain. Patient not taking: Reported on 12/23/2014 11/17/14   Loren Raceravid Yelverton, MD   BP 109/74 mmHg  Pulse 119  Temp(Src) 98.3 F (36.8 C) (Oral)  Resp 32  SpO2 97% Physical Exam  Constitutional: She is oriented to person, place, and time.  The patient is alert and nontoxic. She has no respiratory distress. She does appear deconditioned with central abdominal distention from ascites.  HENT:  Head: Normocephalic and atraumatic.  Mouth/Throat: Oropharynx is clear and moist.  Eyes: EOM are normal. Pupils are equal, round, and reactive to light. Scleral icterus is present.  Neck: Neck supple.  Cardiovascular: Regular rhythm, normal heart sounds and intact distal pulses.   Tachycardia.  Pulmonary/Chest: Effort normal and breath sounds normal.  Abdominal: Soft. Bowel sounds are normal. She exhibits distension. There is no tenderness.  Patient has ascites that is moderate to large. Her abdomen however is not tense. She has an umbilical hernia which reduces easily. Patient endorses discomfort to any area of palpation on the abdomen.  Musculoskeletal: Normal range of motion. She exhibits no edema.  No edema of the lower extremities. Calves are soft and nontender. Patient has muscular atrophy of the extremities.  Neurological: She is alert and oriented to person, place, and time. She has normal  strength. Coordination normal. GCS eye subscore is 4. GCS verbal subscore is 5. GCS motor subscore is 6.  Skin: Skin is warm, dry and intact.  Psychiatric: She has a normal mood and affect.    ED Course  Procedures (including critical care time) Labs Review Labs Reviewed - No data to display  Imaging Review No results found. I have personally reviewed and evaluated these images and lab results as part of my medical decision-making.   EKG Interpretation None      MDM   Final diagnoses:  Chronic liver failure without hepatic coma (HCC)  Ascites of liver  Midline low back pain without sciatica   Patient presents with complaints of pain. She also reports that she needs to get her ascites drained. She reported 3 weeks since her last paracentesis. Review of medical record however indicates that she had paracentesis  6 days ago. She also had diagnostic workup done 1 day ago to rule out for bacterial peritonitis. At this point although she has ascites,it is not tense and I do not feel there is added benefit from paracentesis in the emergency department today. Pain has been chronic in nature. Patient is counseled on the necessity for establishing pain management with her primary treating physicians. She has complex illnesses seen at [facility] by gastroenterology. I did counsel her that ongoing narcotic pain control for outpatient use cannot be initiated through the emergency department and her chronic needs for pain control need to be established by her primary treating physicians. She was prescribed several doses of oxycodone yesterday to get her through to an appointment with her primary providers.    Arby Barrette, MD 01/25/15 [phone]

## 2015-01-23 NOTE — ED Notes (Signed)
Pt from home co abd distention , Hx ascites, was seen yesterday and had paracentesis  Alert and oriented x 4, reports "some' shortness of breath.

## 2015-01-25 LAB — BODY FLUID CULTURE: CULTURE: NO GROWTH

## 2015-02-01 ENCOUNTER — Other Ambulatory Visit: Payer: Self-pay | Admitting: Internal Medicine

## 2015-02-01 ENCOUNTER — Other Ambulatory Visit: Payer: Self-pay | Admitting: Radiology

## 2015-02-01 DIAGNOSIS — R188 Other ascites: Secondary | ICD-10-CM

## 2015-02-02 ENCOUNTER — Ambulatory Visit
Admission: RE | Admit: 2015-02-02 | Discharge: 2015-02-02 | Disposition: A | Discharge status: Home / Self Care | Payer: BC Managed Care – PPO | Source: Ambulatory Visit | Attending: Internal Medicine | Admitting: Internal Medicine

## 2015-02-02 ENCOUNTER — Emergency Department (HOSPITAL_COMMUNITY)
Admission: EM | Admit: 2015-02-02 | Discharge: 2015-02-02 | Discharge status: Against Medical Advice | Payer: BC Managed Care – PPO | Source: Home / Self Care

## 2015-02-02 ENCOUNTER — Encounter (HOSPITAL_COMMUNITY): Payer: Self-pay | Admitting: Emergency Medicine

## 2015-02-02 DIAGNOSIS — K7290 Hepatic failure, unspecified without coma: Secondary | ICD-10-CM | POA: Diagnosis not present

## 2015-02-02 DIAGNOSIS — A419 Sepsis, unspecified organism: Secondary | ICD-10-CM | POA: Diagnosis not present

## 2015-02-02 LAB — CBC
HCT: 30.2 % — ABNORMAL LOW (ref 36.0–46.0)
HEMOGLOBIN: 9.9 g/dL — AB (ref 12.0–15.0)
MCH: 34 pg (ref 26.0–34.0)
MCHC: 32.8 g/dL (ref 30.0–36.0)
MCV: 103.8 fL — AB (ref 78.0–100.0)
PLATELETS: 73 10*3/uL — AB (ref 150–400)
RBC: 2.91 MIL/uL — AB (ref 3.87–5.11)
RDW: 15.5 % (ref 11.5–15.5)
WBC: 4.1 10*3/uL (ref 4.0–10.5)

## 2015-02-02 LAB — COMPREHENSIVE METABOLIC PANEL
ALBUMIN: 2.9 g/dL — AB (ref 3.5–5.0)
ALK PHOS: 583 U/L — AB (ref 38–126)
ALT: 55 U/L — AB (ref 14–54)
AST: 127 U/L — ABNORMAL HIGH (ref 15–41)
Anion gap: 10 (ref 5–15)
BUN: 6 mg/dL (ref 6–20)
CALCIUM: 8.1 mg/dL — AB (ref 8.9–10.3)
CO2: 25 mmol/L (ref 22–32)
CREATININE: 0.36 mg/dL — AB (ref 0.44–1.00)
Chloride: 93 mmol/L — ABNORMAL LOW (ref 101–111)
GFR calc Af Amer: >60 mL/min (ref >60)
GFR calc non Af Amer: >60 mL/min (ref >60)
GLUCOSE: 106 mg/dL — AB (ref 65–99)
Potassium: 3 mmol/L — ABNORMAL LOW (ref 3.5–5.1)
SODIUM: 128 mmol/L — AB (ref 135–145)
Total Bilirubin: 15.9 mg/dL — ABNORMAL HIGH (ref 0.3–1.2)
Total Protein: 6.1 g/dL — ABNORMAL LOW (ref 6.5–8.1)

## 2015-02-02 LAB — I-STAT BETA HCG BLOOD, ED (MC, WL, AP ONLY)

## 2015-02-02 LAB — LIPASE, BLOOD: Lipase: 55 U/L — ABNORMAL HIGH (ref 11–51)

## 2015-02-02 MED ORDER — ALBUMIN HUMAN 25 % IV SOLN
25.0000 g | Freq: Once | INTRAVENOUS | Status: DC
  Filled 2015-02-02: qty 100

## 2015-02-02 NOTE — OR Nursing (Signed)
1400 Pt called due to not showing up for 1330 appt, mother stated to [place] that she couldn't get here until 1430. Pt still no show by 1530, Dr Fredia SorrowYamagata aware. Left message on mothers phone that she needs to call to reschedule since over 2 hours late.

## 2015-02-02 NOTE — ED Notes (Signed)
Attempted to call patient, no answer in lobby

## 2015-02-02 NOTE — ED Notes (Signed)
Per EMS-states back and abdominal pain secondary to cirrhosis-going on for a week

## 2015-02-02 NOTE — ED Notes (Signed)
Pt states abdominal pain started one week ago but wasn't as bad until today.  Epigastric pain mostly however pain has generalized pain in the abdomen and also complains of chest pain.  Pt has hx of cirrhosis and has noticeable distension of the abdomen.  Pt has diarrhea for one week.  Also c/o headache and lower back pain.

## 2015-02-03 ENCOUNTER — Encounter (HOSPITAL_COMMUNITY): Payer: Self-pay

## 2015-02-03 ENCOUNTER — Inpatient Hospital Stay (HOSPITAL_COMMUNITY): Payer: BC Managed Care – PPO

## 2015-02-03 ENCOUNTER — Inpatient Hospital Stay (HOSPITAL_COMMUNITY)
Admission: EM | Admit: 2015-02-03 | Discharge: 2015-02-06 | DRG: 871 | Disposition: A | Discharge status: Home / Self Care | Payer: BC Managed Care – PPO | Attending: Internal Medicine | Admitting: Internal Medicine

## 2015-02-03 ENCOUNTER — Emergency Department (HOSPITAL_COMMUNITY): Payer: BC Managed Care – PPO

## 2015-02-03 DIAGNOSIS — E876 Hypokalemia: Secondary | ICD-10-CM | POA: Diagnosis present

## 2015-02-03 DIAGNOSIS — K652 Spontaneous bacterial peritonitis: Secondary | ICD-10-CM | POA: Diagnosis present

## 2015-02-03 DIAGNOSIS — D696 Thrombocytopenia, unspecified: Secondary | ICD-10-CM | POA: Diagnosis present

## 2015-02-03 DIAGNOSIS — K7460 Unspecified cirrhosis of liver: Secondary | ICD-10-CM | POA: Diagnosis present

## 2015-02-03 DIAGNOSIS — K7290 Hepatic failure, unspecified without coma: Secondary | ICD-10-CM | POA: Diagnosis present

## 2015-02-03 DIAGNOSIS — K754 Autoimmune hepatitis: Secondary | ICD-10-CM | POA: Diagnosis present

## 2015-02-03 DIAGNOSIS — A419 Sepsis, unspecified organism: Secondary | ICD-10-CM | POA: Diagnosis present

## 2015-02-03 DIAGNOSIS — D688 Other specified coagulation defects: Secondary | ICD-10-CM | POA: Diagnosis present

## 2015-02-03 DIAGNOSIS — E871 Hypo-osmolality and hyponatremia: Secondary | ICD-10-CM | POA: Diagnosis present

## 2015-02-03 DIAGNOSIS — Z7952 Long term (current) use of systemic steroids: Secondary | ICD-10-CM

## 2015-02-03 DIAGNOSIS — Z87891 Personal history of nicotine dependence: Secondary | ICD-10-CM

## 2015-02-03 DIAGNOSIS — I81 Portal vein thrombosis: Secondary | ICD-10-CM | POA: Diagnosis present

## 2015-02-03 DIAGNOSIS — D61818 Other pancytopenia: Secondary | ICD-10-CM | POA: Diagnosis not present

## 2015-02-03 DIAGNOSIS — I8500 Esophageal varices without bleeding: Secondary | ICD-10-CM | POA: Diagnosis present

## 2015-02-03 DIAGNOSIS — R188 Other ascites: Secondary | ICD-10-CM | POA: Diagnosis present

## 2015-02-03 DIAGNOSIS — R1084 Generalized abdominal pain: Secondary | ICD-10-CM

## 2015-02-03 DIAGNOSIS — R140 Abdominal distension (gaseous): Secondary | ICD-10-CM

## 2015-02-03 DIAGNOSIS — R109 Unspecified abdominal pain: Secondary | ICD-10-CM | POA: Diagnosis present

## 2015-02-03 DIAGNOSIS — D649 Anemia, unspecified: Secondary | ICD-10-CM | POA: Diagnosis present

## 2015-02-03 DIAGNOSIS — Z9119 Patient's noncompliance with other medical treatment and regimen: Secondary | ICD-10-CM | POA: Diagnosis not present

## 2015-02-03 DIAGNOSIS — K7682 Hepatic encephalopathy: Secondary | ICD-10-CM

## 2015-02-03 DIAGNOSIS — R100 Acute abdomen: Secondary | ICD-10-CM | POA: Diagnosis not present

## 2015-02-03 DIAGNOSIS — K7469 Other cirrhosis of liver: Secondary | ICD-10-CM | POA: Diagnosis present

## 2015-02-03 DIAGNOSIS — E43 Unspecified severe protein-calorie malnutrition: Secondary | ICD-10-CM | POA: Diagnosis present

## 2015-02-03 LAB — BODY FLUID CELL COUNT WITH DIFFERENTIAL
LYMPHS FL: 0 %
MONOCYTE-MACROPHAGE-SEROUS FLUID: 15 % — AB (ref 50–90)
NEUTROPHIL FLUID: 85 % — AB (ref 0–25)
WBC FLUID: 1242 uL — AB (ref 0–1000)

## 2015-02-03 LAB — CBC WITH DIFFERENTIAL/PLATELET
BASOS ABS: 0 10*3/uL (ref 0.0–0.1)
Basophils Relative: 0 %
Eosinophils Absolute: 0 10*3/uL (ref 0.0–0.7)
Eosinophils Relative: 0 %
HEMATOCRIT: 30.3 % — AB (ref 36.0–46.0)
HEMOGLOBIN: 10.2 g/dL — AB (ref 12.0–15.0)
LYMPHS PCT: 5 %
Lymphs Abs: 0.4 10*3/uL — ABNORMAL LOW (ref 0.7–4.0)
MCH: 34.2 pg — ABNORMAL HIGH (ref 26.0–34.0)
MCHC: 33.7 g/dL (ref 30.0–36.0)
MCV: 101.7 fL — AB (ref 78.0–100.0)
MONO ABS: 0.7 10*3/uL (ref 0.1–1.0)
MONOS PCT: 7 %
NEUTROS ABS: 8.4 10*3/uL — AB (ref 1.7–7.7)
Neutrophils Relative %: 88 %
Platelets: 74 10*3/uL — ABNORMAL LOW (ref 150–400)
RBC: 2.98 MIL/uL — ABNORMAL LOW (ref 3.87–5.11)
RDW: 15.5 % (ref 11.5–15.5)
WBC: 9.5 10*3/uL (ref 4.0–10.5)

## 2015-02-03 LAB — I-STAT CG4 LACTIC ACID, ED
LACTIC ACID, VENOUS: 2.43 mmol/L — AB (ref 0.5–2.0)
Lactic Acid, Venous: 3.75 mmol/L (ref 0.5–2.0)
Lactic Acid, Venous: 2.54 mmol/L (ref 0.5–2.0)

## 2015-02-03 LAB — URINE MICROSCOPIC-ADD ON

## 2015-02-03 LAB — URINALYSIS, ROUTINE W REFLEX MICROSCOPIC
GLUCOSE, UA: NEGATIVE mg/dL
Hgb urine dipstick: NEGATIVE
KETONES UR: 15 mg/dL — AB
Nitrite: POSITIVE — AB
PH: 5.5 (ref 5.0–8.0)
Protein, ur: 30 mg/dL — AB
SPECIFIC GRAVITY, URINE: 1.022 (ref 1.005–1.030)

## 2015-02-03 LAB — COMPREHENSIVE METABOLIC PANEL
ALBUMIN: 2.7 g/dL — AB (ref 3.5–5.0)
ALT: 51 U/L (ref 14–54)
ANION GAP: 11 (ref 5–15)
AST: 106 U/L — ABNORMAL HIGH (ref 15–41)
Alkaline Phosphatase: 521 U/L — ABNORMAL HIGH (ref 38–126)
BUN: 5 mg/dL — ABNORMAL LOW (ref 6–20)
CO2: 25 mmol/L (ref 22–32)
Calcium: 8.3 mg/dL — ABNORMAL LOW (ref 8.9–10.3)
Chloride: 95 mmol/L — ABNORMAL LOW (ref 101–111)
Creatinine, Ser: 0.86 mg/dL (ref 0.44–1.00)
GFR calc Af Amer: >60 mL/min (ref >60)
GFR calc non Af Amer: >60 mL/min (ref >60)
GLUCOSE: 115 mg/dL — AB (ref 65–99)
POTASSIUM: 2.9 mmol/L — AB (ref 3.5–5.1)
SODIUM: 131 mmol/L — AB (ref 135–145)
TOTAL PROTEIN: 5.7 g/dL — AB (ref 6.5–8.1)
Total Bilirubin: 16.5 mg/dL — ABNORMAL HIGH (ref 0.3–1.2)

## 2015-02-03 LAB — APTT: aPTT: 41 seconds — ABNORMAL HIGH (ref 24–37)

## 2015-02-03 LAB — AMMONIA: AMMONIA: 141 umol/L — AB (ref 9–35)

## 2015-02-03 LAB — PROTIME-INR
INR: 2.85 — AB (ref 0.00–1.49)
Prothrombin Time: 29.4 seconds — ABNORMAL HIGH (ref 11.6–15.2)

## 2015-02-03 LAB — MAGNESIUM: Magnesium: 1.6 mg/dL — ABNORMAL LOW (ref 1.7–2.4)

## 2015-02-03 MED ORDER — SODIUM CHLORIDE 0.9 % IV BOLUS (SEPSIS)
500.0000 mL | Freq: Once | INTRAVENOUS | Status: AC
  Administered 2015-02-03: 500 mL via INTRAVENOUS

## 2015-02-03 MED ORDER — POTASSIUM CHLORIDE 10 MEQ/100ML IV SOLN
10.0000 meq | INTRAVENOUS | Status: AC
  Administered 2015-02-03 (×2): 10 meq via INTRAVENOUS
  Filled 2015-02-03: qty 100

## 2015-02-03 MED ORDER — MAGNESIUM SULFATE 2 GM/50ML IV SOLN
2.0000 g | Freq: Once | INTRAVENOUS | Status: AC
  Administered 2015-02-03: 2 g via INTRAVENOUS
  Filled 2015-02-03: qty 50

## 2015-02-03 MED ORDER — MORPHINE SULFATE (PF) 2 MG/ML IV SOLN
2.0000 mg | Freq: Once | INTRAVENOUS | Status: AC
  Administered 2015-02-03: 2 mg via INTRAVENOUS
  Filled 2015-02-03: qty 1

## 2015-02-03 MED ORDER — SPIRONOLACTONE 50 MG PO TABS
150.0000 mg | ORAL_TABLET | Freq: Every day | ORAL | Status: DC
  Administered 2015-02-03 – 2015-02-06 (×4): 150 mg via ORAL
  Filled 2015-02-03 (×4): qty 1

## 2015-02-03 MED ORDER — SODIUM CHLORIDE 0.9 % IV SOLN
INTRAVENOUS | Status: DC
  Administered 2015-02-03 (×2): via INTRAVENOUS

## 2015-02-03 MED ORDER — DEXTROSE 5 % IV SOLN
2.0000 g | Freq: Three times a day (TID) | INTRAVENOUS | Status: DC
  Administered 2015-02-03: 2 g via INTRAVENOUS
  Filled 2015-02-03 (×3): qty 2

## 2015-02-03 MED ORDER — VANCOMYCIN HCL IN DEXTROSE 1-5 GM/200ML-% IV SOLN
1000.0000 mg | Freq: Once | INTRAVENOUS | Status: AC
  Administered 2015-02-03: 1000 mg via INTRAVENOUS
  Filled 2015-02-03: qty 200

## 2015-02-03 MED ORDER — LIDOCAINE HCL (PF) 1 % IJ SOLN
INTRAMUSCULAR | Status: AC
  Filled 2015-02-03: qty 10

## 2015-02-03 MED ORDER — ONDANSETRON HCL 4 MG PO TABS
4.0000 mg | ORAL_TABLET | Freq: Three times a day (TID) | ORAL | Status: DC | PRN
  Administered 2015-02-03 – 2015-02-04 (×2): 4 mg via ORAL
  Filled 2015-02-03 (×2): qty 1

## 2015-02-03 MED ORDER — POTASSIUM CHLORIDE CRYS ER 20 MEQ PO TBCR
40.0000 meq | EXTENDED_RELEASE_TABLET | ORAL | Status: AC
  Administered 2015-02-03 (×2): 40 meq via ORAL
  Filled 2015-02-03 (×2): qty 2

## 2015-02-03 MED ORDER — CARVEDILOL 3.125 MG PO TABS
3.1250 mg | ORAL_TABLET | Freq: Two times a day (BID) | ORAL | Status: DC
  Administered 2015-02-03 – 2015-02-06 (×6): 3.125 mg via ORAL
  Filled 2015-02-03 (×7): qty 1

## 2015-02-03 MED ORDER — SODIUM CHLORIDE 0.9 % IV BOLUS (SEPSIS)
1000.0000 mL | Freq: Once | INTRAVENOUS | Status: AC
  Administered 2015-02-03: 1000 mL via INTRAVENOUS

## 2015-02-03 MED ORDER — SODIUM CHLORIDE 0.9 % IV SOLN
Freq: Once | INTRAVENOUS | Status: AC
  Administered 2015-02-03: 09:00:00 via INTRAVENOUS

## 2015-02-03 MED ORDER — AZATHIOPRINE 50 MG PO TABS
25.0000 mg | ORAL_TABLET | Freq: Every day | ORAL | Status: DC
  Administered 2015-02-03 – 2015-02-06 (×4): 25 mg via ORAL
  Filled 2015-02-03 (×4): qty 1

## 2015-02-03 MED ORDER — SODIUM CHLORIDE 0.9 % IV SOLN
INTRAVENOUS | Status: AC
  Administered 2015-02-03: 10:00:00 via INTRAVENOUS

## 2015-02-03 MED ORDER — GI COCKTAIL ~~LOC~~
10.0000 mL | Freq: Once | ORAL | Status: AC
  Administered 2015-02-03: 10 mL via ORAL
  Filled 2015-02-03: qty 30

## 2015-02-03 MED ORDER — VANCOMYCIN HCL 500 MG IV SOLR
500.0000 mg | Freq: Two times a day (BID) | INTRAVENOUS | Status: DC
  Filled 2015-02-03: qty 500

## 2015-02-03 MED ORDER — OXYCODONE HCL 5 MG PO TABS
5.0000 mg | ORAL_TABLET | ORAL | Status: DC | PRN
Start: 2015-02-03 — End: 2015-02-04
  Administered 2015-02-03 (×2): 5 mg via ORAL
  Filled 2015-02-03 (×3): qty 1

## 2015-02-03 MED ORDER — PREDNISONE 10 MG PO TABS
5.0000 mg | ORAL_TABLET | Freq: Every day | ORAL | Status: DC
  Administered 2015-02-04 – 2015-02-06 (×3): 5 mg via ORAL
  Filled 2015-02-03 (×4): qty 1

## 2015-02-03 MED ORDER — LACTULOSE 10 GM/15ML PO SOLN
20.0000 g | Freq: Three times a day (TID) | ORAL | Status: DC
  Administered 2015-02-03 – 2015-02-05 (×7): 20 g via ORAL
  Filled 2015-02-03 (×7): qty 30

## 2015-02-03 MED ORDER — POTASSIUM CHLORIDE 10 MEQ/100ML IV SOLN
10.0000 meq | Freq: Once | INTRAVENOUS | Status: AC
  Administered 2015-02-03: 10 meq via INTRAVENOUS
  Filled 2015-02-03: qty 100

## 2015-02-03 MED ORDER — FUROSEMIDE 40 MG PO TABS
20.0000 mg | ORAL_TABLET | Freq: Two times a day (BID) | ORAL | Status: DC
  Administered 2015-02-03 – 2015-02-05 (×4): 20 mg via ORAL
  Filled 2015-02-03 (×4): qty 1

## 2015-02-03 MED ORDER — URSODIOL 300 MG PO CAPS
300.0000 mg | ORAL_CAPSULE | Freq: Two times a day (BID) | ORAL | Status: DC
  Administered 2015-02-03 – 2015-02-06 (×6): 300 mg via ORAL
  Filled 2015-02-03 (×7): qty 1

## 2015-02-03 MED ORDER — METOCLOPRAMIDE HCL 10 MG PO TABS
5.0000 mg | ORAL_TABLET | Freq: Two times a day (BID) | ORAL | Status: DC
  Administered 2015-02-04 – 2015-02-06 (×5): 5 mg via ORAL
  Filled 2015-02-03 (×5): qty 1

## 2015-02-03 MED ORDER — DEXTROSE 5 % IV SOLN
2.0000 g | Freq: Two times a day (BID) | INTRAVENOUS | Status: AC
  Administered 2015-02-03 – 2015-02-05 (×6): 2 g via INTRAVENOUS
  Filled 2015-02-03 (×6): qty 2

## 2015-02-03 MED ORDER — PANTOPRAZOLE SODIUM 40 MG PO TBEC
40.0000 mg | DELAYED_RELEASE_TABLET | Freq: Every day | ORAL | Status: DC
  Administered 2015-02-03 – 2015-02-06 (×4): 40 mg via ORAL
  Filled 2015-02-03 (×4): qty 1

## 2015-02-03 MED ORDER — KETOROLAC TROMETHAMINE 15 MG/ML IJ SOLN
15.0000 mg | Freq: Four times a day (QID) | INTRAMUSCULAR | Status: DC
  Administered 2015-02-03 – 2015-02-04 (×3): 15 mg via INTRAVENOUS
  Filled 2015-02-03 (×8): qty 1

## 2015-02-03 MED ORDER — LIDOCAINE VISCOUS 2 % MT SOLN
15.0000 mL | OROMUCOSAL | Status: DC | PRN
  Filled 2015-02-03 (×2): qty 15

## 2015-02-03 MED ORDER — LIDOCAINE HCL (PF) 1 % IJ SOLN
5.0000 mL | Freq: Once | INTRAMUSCULAR | Status: AC
  Administered 2015-02-03: 5 mL via INTRADERMAL
  Filled 2015-02-03: qty 5

## 2015-02-03 NOTE — ED Notes (Signed)
Off unit with xray. 

## 2015-02-03 NOTE — ED Notes (Signed)
Pt in ultrasound

## 2015-02-03 NOTE — ED Notes (Signed)
Pt off unit with ultrasound 

## 2015-02-03 NOTE — ED Notes (Signed)
CODE SEPSIS ACTIVATED 

## 2015-02-03 NOTE — ED Notes (Signed)
[facility], has multiple complaints. C/o abd pain, was also seen here yesterday for the same, c/o n/v, hx of cirrhosis, and ascites. Pt also c/o SOB and anxiety upon EMS arrival.

## 2015-02-03 NOTE — ED Notes (Signed)
Pt ambulated to the restroom.

## 2015-02-03 NOTE — ED Notes (Signed)
Pt returned from xray

## 2015-02-03 NOTE — ED Notes (Signed)
Ordered heart healthy tray  

## 2015-02-03 NOTE — ED Provider Notes (Addendum)
CSN: [id number]     Arrival date & time 02/03/15  4098 History   First MD Initiated Contact with Patient 02/03/15 [phone]     Chief Complaint  Patient presents with  . Abdominal Pain  . Code Sepsis     (Consider location/radiation/quality/duration/timing/severity/associated sxs/prior Treatment) HPI Comments: 25 year old female with history of malnutrition, bacterial sbp, autoimmune hepatitis, nonalcoholic cirrhosis, sepsis, neutropenia, GI bleeding presents with fever chills abdominal pain. Patient felt it started this morning. Difficulty obtaining history as patient has mild encephalopathy. Patient was recently seen and had blood work done along with paracentesis which was unremarkable. Patient's had worsening symptoms since morning. Patient denies respiratory symptoms.  Patient is a 25 y.o. female presenting with abdominal pain. The history is provided by the patient and medical records.  Abdominal Pain Associated symptoms: no chest pain, no chills, no dysuria, no fever, no shortness of breath and no vomiting     Past Medical History  Diagnosis Date  . Anemia 2012  . Esophageal varices (HCC) 2015    s/p multiple EGDs and bandings dating to 2015.   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 GIB (gastrointestinal bleeding)   . Cirrhosis (HCC) 2003  . Irregular periods 08/04/2014  . Vaginal Pap smear, abnormal   . Hepatic encephalopathy (HCC)   . Coagulopathy (HCC)   . STD (female)     mutiple. risky sexual behavior.   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 Ascites 2003  . Psychosis    Past Surgical History  Procedure Laterality Date  . Gastric banding port revision    . Tonsillectomy    . Esophagogastroduodenoscopy  07/22/2011    Procedure: ESOPHAGOGASTRODUODENOSCOPY (EGD);  Surgeon: Graylin Shiver, MD;  Location: [facility] ENDOSCOPY;  Service: Endoscopy;  Laterality: N/A;  . Esophagogastroduodenoscopy N/A 09/11/2014    Procedure:  ESOPHAGOGASTRODUODENOSCOPY (EGD);  Surgeon: Charlott Rakes, MD;  Location: [facility] ENDOSCOPY;  Service: Endoscopy;  Laterality: N/A;   Family History  Problem Relation Age of Onset  . Adopted: Yes   Social History  Substance Use Topics  . Smoking status: Former Smoker -- 0.50 packs/day for 8 years    Types: Cigarettes  . Smokeless tobacco: Never Used  . Alcohol Use: No   OB History    Gravida Para Term Preterm AB TAB SAB Ectopic Multiple Living   0 0 0 0 0 0 0 0 0 0      Review of Systems  Constitutional: Negative for fever and chills.  HENT: Negative for congestion.   Eyes: Negative for visual disturbance.  Respiratory: Negative for shortness of breath.   Cardiovascular: Negative for chest pain.  Gastrointestinal: Positive for abdominal pain. Negative for vomiting and blood in stool.  Genitourinary: Negative for dysuria and flank pain.  Musculoskeletal: Negative for back pain, neck pain and neck stiffness.  Skin: Negative for rash.  Neurological: Negative for light-headedness and headaches.  Psychiatric/Behavioral: Positive for confusion.      Allergies  Nsaids; Tylenol; and Amoxicillin  Home Medications   Prior to Admission medications   Medication Sig Start Date End Date Taking? Authorizing Provider  azaTHIOprine (IMURAN) 50 MG tablet Take 25 mg by mouth daily.     Historical Provider, MD  carvedilol (COREG) 3.125 MG tablet Take 3.125 mg by mouth 2 (two) times daily with a meal.     Historical Provider, MD  furosemide (LASIX) 20 MG tablet Take 2 tablets (40 mg total) by mouth daily. Patient  taking differently: Take 20 mg by mouth 2 (two) times daily.  10/12/14   Shanker Levora Dredge, MD  lactulose (CHRONULAC) 10 GM/15ML solution Take 30 mLs (20 g total) by mouth 2 (two) times daily. Patient taking differently: Take 20 g by mouth 3 (three) times daily.  09/13/14   Vilinda Blanks Minor, NP  lidocaine (XYLOCAINE) 2 % solution Use as directed 15 mLs in the mouth or throat every 4  (four) hours as needed for mouth pain.    Historical Provider, MD  metoCLOPramide (REGLAN) 5 MG tablet Take 5 mg by mouth 2 (two) times daily.     Historical Provider, MD  omeprazole (PRILOSEC) 40 MG capsule Take 40 mg by mouth daily.    Historical Provider, MD  ondansetron (ZOFRAN) 4 MG tablet Take 1 tablet (4 mg total) by mouth every 8 (eight) hours as needed for nausea or vomiting. Patient not taking: Reported on 11/23/2014 10/12/14   Maretta Bees, MD  ondansetron (ZOFRAN) 4 MG tablet Take 1 tablet (4 mg total) by mouth every 8 (eight) hours as needed for nausea or vomiting. 01/22/15   Courteney Lyn Mackuen, MD  oxyCODONE (ROXICODONE) 5 MG immediate release tablet Take 1 tablet (5 mg total) by mouth every 4 (four) hours as needed for severe pain. Patient not taking: Reported on 12/23/2014 11/17/14   Loren Racer, MD  oxyCODONE (ROXICODONE) 5 MG immediate release tablet Take 1 tablet (5 mg total) by mouth every 4 (four) hours as needed for severe pain. 01/22/15   Courteney Lyn Mackuen, MD  predniSONE (DELTASONE) 5 MG tablet Take 5 mg by mouth daily with breakfast.    Historical Provider, MD  spironolactone (ALDACTONE) 50 MG tablet Take 150 mg by mouth daily.    Historical Provider, MD  sulfamethoxazole-trimethoprim (BACTRIM DS,SEPTRA DS) 800-160 MG tablet Take 1 tablet by mouth daily.     Historical Provider, MD  ursodiol (ACTIGALL) 300 MG capsule Take 300 mg by mouth 2 (two) times daily.     Historical Provider, MD   BP 109/67 mmHg  Pulse 127  Temp(Src) 102.3 F (39.1 C) (Rectal)  Resp 28  Ht 4\' 11"  (1.499 m)  Wt 100 lb 9.6 oz (45.632 kg)  BMI 20.31 kg/m2  SpO2 99% Physical Exam  Constitutional: She is oriented to person, place, and time. She appears well-developed and well-nourished.  HENT:  Head: Normocephalic and atraumatic.  Dry mucous membranes, scleral icterus no meningismus  Eyes: Right eye exhibits no discharge. Left eye exhibits no discharge. Scleral icterus is present.   Neck: Normal range of motion. Neck supple. No tracheal deviation present.  Cardiovascular: Regular rhythm.  Tachycardia present.   Pulmonary/Chest: Breath sounds normal. Respiratory distress: tachypnea.  Abdominal: Soft. She exhibits no distension. There is tenderness (mild diffuse). There is no guarding.  Musculoskeletal: She exhibits no edema.  Neurological: She is alert and oriented to person, place, and time.  Patient moves all extremities equal bilateral, mild encephalopathy, patient knows name date of birth and stayed however does not know city. XRT muscle function intact, pupils equal.  Skin: Skin is warm. No rash noted.  Psychiatric:  mild encephalopathy  Nursing note and vitals reviewed.   ED Course  Procedures (including critical care time) CRITICAL CARE Performed by: Enid Skeens   Total critical care time: 75 minutes  Critical care time was exclusive of separately billable procedures and treating other patients.  Critical care was necessary to treat or prevent imminent or life-threatening deterioration.  Critical care was  time spent personally by me on the following activities: development of treatment plan with patient and/or surrogate as well as nursing, discussions with consultants, evaluation of patient's response to treatment, examination of patient, obtaining history from patient or surrogate, ordering and performing treatments and interventions, ordering and review of laboratory studies, ordering and review of radiographic studies, pulse oximetry and re-evaluation of patient's condition.   Procedure: Limited Abdominal Ultrasound for evaluation of Free Fluid  Indications: abd pain  Multiple views of the abdomen are obtained with a multi frequency curvilinear or phased array probe. The study is performed and interpreted by me. Images are also reviewed by Dr. No att. providers found. Images are archived per our departmental policy.  Findings: Free fluid   present. The fluid isimple.  Interpretation: The study is positive for free fluid consistent with ascites.  CPT Code [phone] (limited abdominal)  Paracentesis Indication fever abdominal pain cirrhosis Discussed risks and benefits with patient who agrees with plan Ultrasound guided using sterile probe and cover, sterile gloves used an 18-gauge needle used after lidocaine to numb the skin. 20 cc yellow fluid One attempt no complications Labs sent. Performed by myself  Sepsis reevaluation, heart rate improved, blood pressure normal. IV fluids and IV antibiotics going. IV potassium given for hypokalemia. EKG reviewed. Labs Review Labs Reviewed  COMPREHENSIVE METABOLIC PANEL - Abnormal; Notable for the following:    Sodium 131 (*)    Potassium 2.9 (*)    Chloride 95 (*)    Glucose, Bld 115 (*)    BUN 5 (*)    Calcium 8.3 (*)    Total Protein 5.7 (*)    Albumin 2.7 (*)    AST 106 (*)    Alkaline Phosphatase 521 (*)    Total Bilirubin 16.5 (*)    All other components within normal limits  CBC WITH DIFFERENTIAL/PLATELET - Abnormal; Notable for the following:    RBC 2.98 (*)    Hemoglobin 10.2 (*)    HCT 30.3 (*)    MCV 101.7 (*)    MCH 34.2 (*)    Platelets 74 (*)    Neutro Abs 8.4 (*)    Lymphs Abs 0.4 (*)    All other components within normal limits  URINALYSIS, ROUTINE W REFLEX MICROSCOPIC (NOT AT [facility]) - Abnormal; Notable for the following:    Color, Urine ORANGE (*)    APPearance CLOUDY (*)    Bilirubin Urine LARGE (*)    Ketones, ur 15 (*)    Protein, ur 30 (*)    Nitrite POSITIVE (*)    Leukocytes, UA SMALL (*)    All other components within normal limits  AMMONIA - Abnormal; Notable for the following:    Ammonia 141 (*)    All other components within normal limits  URINE MICROSCOPIC-ADD ON - Abnormal; Notable for the following:    Squamous Epithelial / LPF 6-30 (*)    Bacteria, UA MANY (*)    Casts HYALINE CASTS (*)    All other components within normal  limits  I-STAT CG4 LACTIC ACID, ED - Abnormal; Notable for the following:    Lactic Acid, Venous 3.75 (*)    All other components within normal limits  CULTURE, BLOOD (ROUTINE X 2)  CULTURE, BLOOD (ROUTINE X 2)  URINE CULTURE  MAGNESIUM  BODY FLUID CELL COUNT WITH DIFFERENTIAL    Imaging Review Dg Chest 2 View  02/03/2015  CLINICAL DATA:  Sepsis.  History of liver disease. EXAM: CHEST  2 VIEW COMPARISON:  09/12/2014 FINDINGS: Heart size is normal. Mediastinal shadows are normal. The hemidiaphragms are elevated consistent with ascites. Mild hypoaerative changes at the lung bases secondary to that. No evidence of true collapse, infiltrate or effusion. Bony structures are unremarkable. IMPRESSION: Elevated hemidiaphragms consistent with the presence of ascites. Hypoaerative changes at the lung bases without evidence of true collapse or infiltrate. Electronically Signed   By: Paulina FusiMark  Shogry M.D.   On: 02/03/2015 07:54   I have personally reviewed and evaluated these images and lab results as part of my medical decision-making.   EKG Interpretation   Date/Time:  Friday February 03 2015 06:53:01 EST Ventricular Rate:  143 PR Interval:  126 QRS Duration: 62 QT Interval:  320 QTC Calculation: 494 R Axis:   37 Text Interpretation:  Sinus tachycardia Borderline T abnormalities,  anterior leads Borderline prolonged QT interval Confirmed by Nivaan Dicenzo  MD,  Quanell Loughney ([id number]) on 02/03/2015 9:19:06 AM      MDM   Final diagnoses:  Sepsis, unspecified organism (HCC)  Hepatic encephalopathy (HCC)  Abdominal distention  Abdominal pain, diffuse  Hypokalemia  Ascites   Patient presents with worsening bowel pain and now fever and chills. Patient is mild encephalopathy on exam. Plan for broad blood work/septic evaluation. Code sepsis called. 2 IV fluid boluses ordered. Chest x-ray pending. Clinically patient has a pack encephalopathy. Patient improved on reassessment after fluid bolus, second fluid bolus  pending. Paracentesis obtained with abdominal pain fever. Ultrasound-guided and images saved. Discussed with triad hospitalist for stepdown admission.  The patients results and plan were reviewed and discussed.   Any x-rays performed were independently reviewed by myself.   Differential diagnosis were considered with the presenting HPI.  Medications  aztreonam (AZACTAM) 2 g in dextrose 5 % 50 mL IVPB (0 g Intravenous Stopped 02/03/15 0811)  vancomycin (VANCOCIN) 500 mg in sodium chloride 0.9 % 100 mL IVPB (not administered)  potassium chloride 10 mEq in 100 mL IVPB (not administered)  0.9 %  sodium chloride infusion (not administered)  0.9 %  sodium chloride infusion (not administered)  sodium chloride 0.9 % bolus 1,000 mL (0 mLs Intravenous Stopped 02/03/15 0911)  sodium chloride 0.9 % bolus 1,000 mL (1,000 mLs Intravenous New Bag/Given 02/03/15 0728)  lidocaine (PF) (XYLOCAINE) 1 % injection 5 mL (5 mLs Intradermal Given 02/03/15 0728)  vancomycin (VANCOCIN) IVPB 1000 mg/200 mL premix (0 mg Intravenous Stopped 02/03/15 0911)    Filed Vitals:   02/03/15 0730 02/03/15 0822 02/03/15 0845 02/03/15 0900  BP: 115/100 116/72 118/82 109/67  Pulse: 140 134 124 127  Temp:      TempSrc:      Resp: 23 23 26 28   Height:      Weight:      SpO2: 97% 99% 99% 99%    Final diagnoses:  Sepsis, unspecified organism (HCC)  Hepatic encephalopathy (HCC)  Abdominal distention  Abdominal pain, diffuse  Hypokalemia  Ascites    Admission/ observation were discussed with the admitting physician, patient and/or family and they are comfortable with the plan.    Blane OharaJoshua Abryana Lykens, MD 02/03/15 [id number]  Blane OharaJoshua Helena Sardo, MD 02/03/15 1230

## 2015-02-03 NOTE — ED Notes (Signed)
Attempted to call report

## 2015-02-03 NOTE — Procedures (Signed)
   US guided LLQ paracentesis 6 liters removed Tolerated well

## 2015-02-03 NOTE — Progress Notes (Addendum)
ANTIBIOTIC CONSULT NOTE - INITIAL  Pharmacy Consult for vanc/aztreonam Indication: Code Sepsis  Allergies  Allergen Reactions  . Nsaids     Can not take because of Liver  . Tylenol [Acetaminophen]     Can not take because of Liver  . 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 (pt was in the hospital when this occurred - May 2016) Has patient had a PCN reaction causing immediate rash, facial/tongue/throat swelling, SOB or lightheadedness with hypotension: No Has patient had a PCN reaction causing severe rash involving mucus membranes or skin necrosis: No Has patient had a PCN reaction that required hospitalization Yes Has patient had a PCN reaction occurring within the last 10 years: Yes If all of the above answers are "NO", then moay proceed with Ceph    Patient Measurements:   Adjusted Body Weight:   Vital Signs: Temp: 102.3 F (39.1 C) (01/06 0654) Temp Source: Rectal (01/06 0654) BP: 112/84 mmHg (01/06 0700) Pulse Rate: 140 (01/06 0700) Intake/Output from previous day:   Intake/Output from this shift:    Labs:  Recent Labs  02/02/15 1833  WBC 4.1  HGB 9.9*  PLT 73*  CREATININE 0.36*   Estimated Creatinine Clearance: 74 mL/min (by C-G formula based on Cr of 0.36). No results for input(s): VANCOTROUGH, VANCOPEAK, VANCORANDOM, GENTTROUGH, GENTPEAK, GENTRANDOM, TOBRATROUGH, TOBRAPEAK, TOBRARND, AMIKACINPEAK, AMIKACINTROU, AMIKACIN in the last 72 hours.   Microbiology: Recent Results (from the past 720 hour(s))  Body fluid culture     Status: None   Collection Time: 01/11/15  2:59 PM  Result Value Ref Range Status   Specimen Description ABDOMEN  Final   Special Requests NONE  Final   Gram Stain MODERATE WBC SEEN NO ORGANISMS SEEN   Final   Culture No growth aerobically or anaerobically.  Final   Report Status 01/15/2015 FINAL  Final  Culture, blood (Routine X 2) w Reflex to ID Panel     Status: None   Collection Time: 01/17/15  5:50 AM  Result  Value Ref Range Status   Specimen Description BLOOD LEFT ARM  Final   Special Requests BOTTLES DRAWN AEROBIC AND ANAEROBIC  Final   Culture NO GROWTH 5 DAYS  Final   Report Status 01/22/2015 FINAL  Final  Culture, blood (Routine X 2) w Reflex to ID Panel     Status: None   Collection Time: 01/17/15  5:54 AM  Result Value Ref Range Status   Specimen Description BLOOD RIGHT ARM  Final   Special Requests AEROBIC BOTTLE ONLY  Final   Culture NO GROWTH 5 DAYS  Final   Report Status 01/22/2015 FINAL  Final  Culture, body fluid-bottle     Status: None   Collection Time: 01/17/15  9:56 AM  Result Value Ref Range Status   Specimen Description FLUID ABDOMEN ASCITIC  Final   Special Requests BOTTLES DRAWN AEROBIC AND ANAEROBIC  Final   Culture NO GROWTH 5 DAYS  Final   Report Status 01/22/2015 FINAL  Final  Gram stain     Status: None   Collection Time: 01/17/15  9:56 AM  Result Value Ref Range Status   Specimen Description FLUID ABDOMEN ASCITIC  Final   Special Requests NONE  Final   Gram Stain   Final    MODERATE WBC PRESENT,BOTH PMN AND MONONUCLEAR NO ORGANISMS SEEN    Report Status 01/17/2015 FINAL  Final  Body fluid culture     Status: None   Collection Time:  01/22/15  9:33 AM  Result Value Ref Range Status   Specimen Description PERITONEAL FLUID  Final   Special Requests NONE  Final   Gram Stain   Final    RARE WBC PRESENT, PREDOMINANTLY MONONUCLEAR NO ORGANISMS SEEN    Culture   Final    NO GROWTH 3 DAYS [facility]    Report Status 01/25/2015 FINAL  Final    Medical History: Past Medical History  Diagnosis Date  . Anemia 2012  . Esophageal varices (HCC) 2015    s/p multiple EGDs and bandings dating to 2015.   Marland Kitchen. 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 GIB (gastrointestinal bleeding)   . Cirrhosis (HCC) 2003  . Irregular periods 08/04/2014  . Vaginal Pap smear, abnormal   . Hepatic  encephalopathy (HCC)   . Coagulopathy (HCC)   . STD (female)     mutiple. risky sexual behavior.   Marland Kitchen. 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 Ascites 2003  . Psychosis     Assessment: [age] yof with Code Sepsis. Complaining of abdominal pain - has hx of cirrhosis, ascites. Pharmacy consulted to dose vanc/aztreonam. EDP put in orders for fluids, etc. Amoxicillin allergy ("damaged her spleen?"). Tmax/24h 102.3, wbc wnl. Hand-delivered to RN and communicated 1 hour time window to give abx. Advised to give aztreonam 1st - patient has 2 lines and RN will give together. SCr 0.36, CrCl~74.  1/6 vanc>> 1/6 aztreonam>>  1/6 BCx2>> 1/6 UC>>  Goal of Therapy:  Vancomycin trough level 15-20 mcg/ml  Plan:  Aztreonam 2g IV q8h Vanc 1g IV x1 dose; then Vanc 500mg  IV q12h Monitor clinical progress, c/s, renal function, abx plan/LOT VT@SS  as indicated  Babs BertinHaley Alaira Level, PharmD, BCPS Clinical Pharmacist Pager [phone] 02/03/2015 7:21 AM   ADDENDUM: Switch Aztreonam to Cefepime for SBP. Not a true PCN allergy per admitting MD. Aztreonam given x1 in ED.   Plan Aztreonam >> Cefepime 2g IV q12h  Babs BertinHaley Eveline Sauve, PharmD, [facility] Center[facility]PS Clinical Pharmacist Pager [phone] 02/03/2015 10:48 AM   Pharmacy Code Sepsis Protocol  Time of code sepsis page: 0711 Time of antibiotic delivery:  0729  Were antibiotics ordered at the time of the code sepsis page? No Was it required to contact the physician? []  Physician not contacted [x]  Physician contacted to order antibiotics for code sepsis []  Physician contacted to recommend changing antibiotics  Pharmacy consulted for: vanc/aztreonam  Anti-infectives    None      @infusions @   Nurse education provided: [x]  Minutes left to administer antibiotics to achieve 1 hour goal [x]  Correct order of antibiotic administration [x]  Antibiotic Y-site compatibilities     Babs BertinHaley Devri Kreher, PharmD, BCPS Clinical Pharmacist Pager [phone] 02/03/2015 7:31 AM

## 2015-02-03 NOTE — H&P (Signed)
[facility] History and Physical  Melanie Conley ZOX:[id number] DOB: [birth year] DOA: 02/03/2015  Referring physician: zavits PCP: Woodfin Ganja, MD   Chief Complaint: abdominal pain  HPI: Melanie Conley is a 25 y.o. female with a past medical history that includes autoimmune hepatitis, nonalcoholic cirrhosis, bacterial SBP, GI bleed presents to the emergency department with the chief complaint of worsening abdominal pain fever and chills. Initial evaluation in the emergency department concerning for sepsis source unclear.  She reports worsening lower abdominal pain this morning.. Associated symptoms include fever chills nausea without emesis. She also complains of a headache. Denies chest pain palpitations dysuria hematuria frequency or urgency. She denies any melena or bright red blood per rectum. She does report a decreased appetite.  In the emergency department she has a rectal temp of 102.3, heart rate of 140 with blood pressure 115/100. She is given 2 L of fluids and antibiotics. In addition bedside paracentesis with removal of 20 mL of fluid done in the emergency department  Review of Systems:  10 point review of systems completed all systems negative except as indicated in the history of present illness  Past Medical History  Diagnosis Date  . Anemia 2012  . Esophageal varices (HCC) 2015    s/p multiple EGDs and bandings dating to 2015.   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 GIB (gastrointestinal bleeding)   . Cirrhosis (HCC) 2003  . Irregular periods 08/04/2014  . Vaginal Pap smear, abnormal   . Hepatic encephalopathy (HCC)   . Coagulopathy (HCC)   . STD (female)     mutiple. risky sexual behavior.   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 Ascites 2003  . Psychosis    Past Surgical History  Procedure Laterality Date  . Gastric banding port revision    . Tonsillectomy    . Esophagogastroduodenoscopy   07/22/2011    Procedure: ESOPHAGOGASTRODUODENOSCOPY (EGD);  Surgeon: Graylin Shiver, MD;  Location: [facility] ENDOSCOPY;  Service: Endoscopy;  Laterality: N/A;  . Esophagogastroduodenoscopy N/A 09/11/2014    Procedure: ESOPHAGOGASTRODUODENOSCOPY (EGD);  Surgeon: Charlott Rakes, MD;  Location: [facility] ENDOSCOPY;  Service: Endoscopy;  Laterality: N/A;   Social History:  reports that she has quit smoking. Her smoking use included Cigarettes. She has a 4 pack-year smoking history. She has never used smokeless tobacco. She reports that she does not drink alcohol or use illicit drugs.  Allergies  Allergen Reactions  . Nsaids     Can not take because of Liver  . Tylenol [Acetaminophen]     Can not take because of Liver  . 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 (pt was in the hospital when this occurred - May 2016) Has patient had a PCN reaction causing immediate rash, facial/tongue/throat swelling, SOB or lightheadedness with hypotension: No Has patient had a PCN reaction causing severe rash involving mucus membranes or skin necrosis: No Has patient had a PCN reaction that required hospitalization Yes Has patient had a PCN reaction occurring within the last 10 years: Yes If all of the above answers are "NO", then moay proceed with Ceph    Family History  Problem Relation Age of Onset  . Adopted: Yes   do not leave blank  Prior to Admission medications   Medication Sig Start Date End Date Taking? Authorizing Provider  azaTHIOprine (IMURAN) 50 MG tablet Take 25 mg by mouth daily.     Historical Provider, MD  carvedilol (COREG) 3.125 MG tablet Take 3.125 mg by mouth 2 (two) times daily with a meal.     Historical Provider, MD  furosemide (LASIX) 20 MG tablet Take 2 tablets (40 mg total) by mouth daily. Patient taking differently: Take 20 mg by mouth 2 (two) times daily.  10/12/14   Shanker Levora Dredge, MD  lactulose (CHRONULAC) 10 GM/15ML solution Take 30 mLs (20 g total) by mouth 2 (two)  times daily. Patient taking differently: Take 20 g by mouth 3 (three) times daily.  09/13/14   Vilinda Blanks Minor, NP  lidocaine (XYLOCAINE) 2 % solution Use as directed 15 mLs in the mouth or throat every 4 (four) hours as needed for mouth pain.    Historical Provider, MD  metoCLOPramide (REGLAN) 5 MG tablet Take 5 mg by mouth 2 (two) times daily.     Historical Provider, MD  omeprazole (PRILOSEC) 40 MG capsule Take 40 mg by mouth daily.    Historical Provider, MD  ondansetron (ZOFRAN) 4 MG tablet Take 1 tablet (4 mg total) by mouth every 8 (eight) hours as needed for nausea or vomiting. Patient not taking: Reported on 11/23/2014 10/12/14   Maretta Bees, MD  ondansetron (ZOFRAN) 4 MG tablet Take 1 tablet (4 mg total) by mouth every 8 (eight) hours as needed for nausea or vomiting. 01/22/15   Courteney Lyn Mackuen, MD  oxyCODONE (ROXICODONE) 5 MG immediate release tablet Take 1 tablet (5 mg total) by mouth every 4 (four) hours as needed for severe pain. Patient not taking: Reported on 12/23/2014 11/17/14   Loren Racer, MD  oxyCODONE (ROXICODONE) 5 MG immediate release tablet Take 1 tablet (5 mg total) by mouth every 4 (four) hours as needed for severe pain. 01/22/15   Courteney Lyn Mackuen, MD  predniSONE (DELTASONE) 5 MG tablet Take 5 mg by mouth daily with breakfast.    Historical Provider, MD  spironolactone (ALDACTONE) 50 MG tablet Take 150 mg by mouth daily.    Historical Provider, MD  sulfamethoxazole-trimethoprim (BACTRIM DS,SEPTRA DS) 800-160 MG tablet Take 1 tablet by mouth daily.     Historical Provider, MD  ursodiol (ACTIGALL) 300 MG capsule Take 300 mg by mouth 2 (two) times daily.     Historical Provider, MD   Physical Exam: Filed Vitals:   02/03/15 0845 02/03/15 0900 02/03/15 0930 02/03/15 0945  BP: 118/82 109/67 118/72 117/102  Pulse: 124 127 126 124  Temp:      TempSrc:      Resp: 26 28    Height:      Weight:      SpO2: 99% 99% 100% 100%    Wt Readings from Last 3  Encounters:  02/03/15 45.632 kg (100 lb 9.6 oz)  02/02/15 46.811 kg (103 lb 3.2 oz)  01/17/15 43.7 kg (96 lb 5.5 oz)    General:  Appears thin and frail chronically ill calm and comfortable Eyes: PERRL, normal lids, +scleral icterus ENT: grossly normal hearing, his membranes of her mouth are pink slightly dry Neck: no LAD, masses or thyromegaly Cardiovascular: Tachycardic but regular no m/r/g. No LE edema. Telemetry: SR, no arrhythmias  Respiratory: CTA bilaterally, no w/r/r. Normal respiratory effort. Abdomen: Severe distention sluggish bowel sounds mild diffuse tenderness to palpation no guarding or rebounding Skin: no rash or induration seen on limited exam Musculoskeletal: grossly normal tone BUE/BLE Psychiatric: grossly normal mood and affect, speech fluent and appropriate Neurologic: grossly non-focal. moves all extremities spontaneously speech clear but slow oriented to self and  place           Labs on Admission:  Basic Metabolic Panel:  Recent Labs Lab 02/02/15 1833 02/03/15 0700  NA 128* 131*  K 3.0* 2.9*  CL 93* 95*  CO2 25 25  GLUCOSE 106* 115*  BUN 6 5*  CREATININE 0.36* 0.86  CALCIUM 8.1* 8.3*   Liver Function Tests:  Recent Labs Lab 02/02/15 1833 02/03/15 0700  AST 127* 106*  ALT 55* 51  ALKPHOS 583* 521*  BILITOT 15.9* 16.5*  PROT 6.1* 5.7*  ALBUMIN 2.9* 2.7*    Recent Labs Lab 02/02/15 1833  LIPASE 55*    Recent Labs Lab 02/03/15 0700  AMMONIA 141*   CBC:  Recent Labs Lab 02/02/15 1833 02/03/15 0700  WBC 4.1 9.5  NEUTROABS  --  8.4*  HGB 9.9* 10.2*  HCT 30.2* 30.3*  MCV 103.8* 101.7*  PLT 73* 74*   Cardiac Enzymes: No results for input(s): CKTOTAL, CKMB, CKMBINDEX, TROPONINI in the last 168 hours.  BNP (last 3 results) No results for input(s): BNP in the last 8760 hours.  ProBNP (last 3 results) No results for input(s): PROBNP in the last 8760 hours.  CBG: No results for input(s): GLUCAP in the last 168  hours.  Radiological Exams on Admission: Dg Chest 2 View  02/03/2015  CLINICAL DATA:  Sepsis.  History of liver disease. EXAM: CHEST  2 VIEW COMPARISON:  09/12/2014 FINDINGS: Heart size is normal. Mediastinal shadows are normal. The hemidiaphragms are elevated consistent with ascites. Mild hypoaerative changes at the lung bases secondary to that. No evidence of true collapse, infiltrate or effusion. Bony structures are unremarkable. IMPRESSION: Elevated hemidiaphragms consistent with the presence of ascites. Hypoaerative changes at the lung bases without evidence of true collapse or infiltrate. Electronically Signed   By: Paulina Fusi M.D.   On: 02/03/2015 07:54    EKG: Independently reviewed. Sinus tachycardia Borderline T abnormalities, anterior leads Borderline prolonged QT interval  Assessment/Plan Principal Problem:   Sepsis (HCC) Active Problems:   Anemia   Abdominal pain, acute   Protein-calorie malnutrition, severe (HCC)   Hypokalemia   Cirrhosis, non-alcoholic (HCC)   Ascites  #1. Sepsis due to SBP. Urine somewhat dirty, chest x-ray without evidence of infiltrate. Lactic acid elevated temperature 102.3 rectally tachycardic blood pressure stable -Sepsis called in the emergency department protocol followed -IV fluids antibiotics blood cultures bedside paracentesis -Admit to step down -Track lactic acid -Follow paracentesis culture report  #2. Abdominal pain and distention/ascites. Somewhat chronic. Concern for SBP. Review indicates she has scheduled paracentesis every 2 weeks See #1 -Stat ultrasound-guided paracentesis per IR -Consider GI consult  #3. Recent GI bleed with esophageal varices. Status post banding. Followed by [facility]  #4. Hyponatremia. Somewhat chronic. Fluid overload -Paracentesis as noted above -Continue Lasix and spironolactone  5. Autoimmune hepatitis. Stable at baseline -Continue home meds  #6. End-stage liver disease. Total bilirubin 15.9. ALT 55 AST  127 alkaline phosphatase 583. 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    Code Status: full DVT Prophylaxis: Family Communication: none present Disposition Plan: home  Time spent: 65 minutes  [facility]  I have evaluated the patient, reviewed the chart, modified the above note and discussed the plan with Toya Smothers, NP. Will change Vanc/Azactam to Cefepime to cover SBP. Will have therapeutic paracentesis. Morphine and Toradol for abdominal  pain. Will replace K by oral and IV replacement.     Calvert CantorSaima Lawayne Hartig, MD Pager: [email or web address]

## 2015-02-03 NOTE — ED Notes (Signed)
2nd attempt to call report.  

## 2015-02-03 NOTE — ED Notes (Signed)
Pt returned from ultrasound

## 2015-02-04 DIAGNOSIS — A419 Sepsis, unspecified organism: Principal | ICD-10-CM

## 2015-02-04 DIAGNOSIS — E43 Unspecified severe protein-calorie malnutrition: Secondary | ICD-10-CM

## 2015-02-04 DIAGNOSIS — E876 Hypokalemia: Secondary | ICD-10-CM

## 2015-02-04 DIAGNOSIS — K7460 Unspecified cirrhosis of liver: Secondary | ICD-10-CM

## 2015-02-04 DIAGNOSIS — R100 Acute abdomen: Secondary | ICD-10-CM

## 2015-02-04 DIAGNOSIS — D61818 Other pancytopenia: Secondary | ICD-10-CM

## 2015-02-04 LAB — CBC
HEMATOCRIT: 26.4 % — AB (ref 36.0–46.0)
HEMOGLOBIN: 8.5 g/dL — AB (ref 12.0–15.0)
MCH: 33.3 pg (ref 26.0–34.0)
MCHC: 32.2 g/dL (ref 30.0–36.0)
MCV: 103.5 fL — ABNORMAL HIGH (ref 78.0–100.0)
Platelets: 53 10*3/uL — ABNORMAL LOW (ref 150–400)
RBC: 2.55 MIL/uL — AB (ref 3.87–5.11)
RDW: 15.8 % — ABNORMAL HIGH (ref 11.5–15.5)
WBC: 4.9 10*3/uL (ref 4.0–10.5)

## 2015-02-04 LAB — URINE CULTURE

## 2015-02-04 LAB — COMPREHENSIVE METABOLIC PANEL
ALBUMIN: 2 g/dL — AB (ref 3.5–5.0)
ALK PHOS: 354 U/L — AB (ref 38–126)
ALT: 36 U/L (ref 14–54)
ANION GAP: 7 (ref 5–15)
AST: 61 U/L — ABNORMAL HIGH (ref 15–41)
BILIRUBIN TOTAL: 16.6 mg/dL — AB (ref 0.3–1.2)
BUN: 6 mg/dL (ref 6–20)
CALCIUM: 7.6 mg/dL — AB (ref 8.9–10.3)
CO2: 20 mmol/L — ABNORMAL LOW (ref 22–32)
Chloride: 102 mmol/L (ref 101–111)
Creatinine, Ser: 0.74 mg/dL (ref 0.44–1.00)
GLUCOSE: 120 mg/dL — AB (ref 65–99)
POTASSIUM: 4.1 mmol/L (ref 3.5–5.1)
Sodium: 129 mmol/L — ABNORMAL LOW (ref 135–145)
TOTAL PROTEIN: 4.6 g/dL — AB (ref 6.5–8.1)

## 2015-02-04 LAB — TSH: TSH: 0.476 u[IU]/mL (ref 0.350–4.500)

## 2015-02-04 LAB — PROCALCITONIN: Procalcitonin: 6.26 ng/mL

## 2015-02-04 LAB — MRSA PCR SCREENING: MRSA BY PCR: NEGATIVE

## 2015-02-04 LAB — LACTIC ACID, PLASMA: LACTIC ACID, VENOUS: 2.3 mmol/L — AB (ref 0.5–2.0)

## 2015-02-04 MED ORDER — HYDROMORPHONE HCL 1 MG/ML IJ SOLN
0.5000 mg | Freq: Once | INTRAMUSCULAR | Status: AC
  Administered 2015-02-04: 0.5 mg via INTRAVENOUS
  Filled 2015-02-04: qty 1

## 2015-02-04 MED ORDER — OXYCODONE HCL 5 MG PO TABS: 5.0000 mg | ORAL_TABLET | ORAL | Status: DC | PRN

## 2015-02-04 MED ORDER — HYDROMORPHONE HCL 1 MG/ML IJ SOLN
0.5000 mg | INTRAMUSCULAR | Status: DC | PRN
  Administered 2015-02-04 – 2015-02-06 (×9): 0.5 mg via INTRAVENOUS
  Filled 2015-02-04 (×9): qty 1

## 2015-02-04 MED ORDER — KETOROLAC TROMETHAMINE 30 MG/ML IJ SOLN: 15.0000 mg | Freq: Four times a day (QID) | INTRAMUSCULAR | Status: DC | PRN

## 2015-02-04 MED ORDER — KETOROLAC TROMETHAMINE 30 MG/ML IJ SOLN
15.0000 mg | Freq: Four times a day (QID) | INTRAMUSCULAR | Status: DC
  Administered 2015-02-04: 15 mg via INTRAVENOUS
  Filled 2015-02-04 (×2): qty 1

## 2015-02-04 NOTE — Progress Notes (Signed)
Nutrition Brief Note  Patient identified on the Malnutrition Screening Tool (MST) Report  Wt Readings from Last 15 Encounters:  02/04/15 100 lb 8.8 oz (45.61 kg)  02/02/15 103 lb 3.2 oz (46.811 kg)  01/17/15 96 lb 5.5 oz (43.7 kg)  01/10/15 120 lb (54.432 kg)  01/09/15 119 lb (53.978 kg)  01/04/15 102 lb (46.267 kg)  12/29/14 107 lb (48.535 kg)  12/23/14 107 lb (48.535 kg)  11/19/14 107 lb 14.4 oz (48.943 kg)  11/17/14 107 lb 14.4 oz (48.943 kg)  10/12/14 100 lb 8 oz (45.587 kg)  09/11/14 99 lb 3 oz (44.991 kg)  09/07/14 110 lb 9.6 oz (50.168 kg)  08/31/14 99 lb (44.906 kg)  08/07/14 100 lb (45.36 kg)    Body mass index is 20.3 kg/(m^2). Patient meets criteria for healthy ht to wt based on current BMI.   RD presented to pt multiple times and each time patient was extremely lethargic and difficult to converse with. She stated that she had not reported any weight loss or any trouble eating. She says she has gained weight, albeit fluid, in her stomach.   Pt did not verbalize any nutritional issues at this time. RD asked if she would like anything to eat. She could not really decide. RD told pt about dietary contact number on back of menu if she would like to order something special.   Current diet order is HH, patient is consuming approximately 50% of meals at this time.   No nutrition interventions warranted at this time. If nutrition issues arise, please consult RD.   Christophe LouisNathan Aamira Bischoff RD, LDN Nutrition Pager: [phone] 02/04/2015 5:00 PM

## 2015-02-04 NOTE — Progress Notes (Signed)
Melanie Conley - Stepdown/ICU TEAM PROGRESS NOTE  VRINDA HECKSTALL Conley:[id number] DOB: Mar 25, 1990 DOA: Conley/06/2015 PCP: Woodfin Ganja, MD  Admit HPI / Brief Narrative: 25 yo female with a history of autoimmune hepatitis w/ cirrhosis, bacterial SBP, and GI bleeding who presented to the ED with the chief complaint of worsening abdominal pain with fever and chills. Evaluation in the ED was concerning for sepsis.  In the emergency department she has a rectal temp of 102.3, heart rate of 140 with blood pressure 115/100. She was given 2 L of fluids and antibiotics. In addition bedside paracentesis with removal of 20 mL of fluid was carried out in the ED.  HPI/Subjective: The pt is awake but c/o abdom pain.  She asks for dilaudid by name, stating that pain medicine by mouth does not work for her.  She denies cp, sob, f/c, or vomiting at present.    Assessment/Plan:  Sepsis due to SBP  ascites fluid w/ 1242 WBC - abdom very tender to touch - cont empiric abx coverage - follow clinically in SDU   Abdominal pain and distention/ascites she has scheduled paracentesis every 2 weeks - does not appear to have high volume ascites fluid at present   Recent GI bleed with esophageal varices status post banding - followed by [facility] - follow Hgb trend w/ volume expansion - no evidence of blood loss at this time   Hyponatremia Chronic - follow trend w/ hydration   Hypokalemia  Corrected w/ replacement - follow  Hypomagnesemia Replace and follow trend   Abnormal UA High squam epi count suggests dirty collection - cx not helpful  Autoimmune hepatitis Continue home meds  Coagulopathy - Thrombocytopenia  Due to above - no evidence of spontaneous bleeding   End-stage liver disease chart review indicates at this point she is not a transplant candidate because of noncompliance and very poor social situation - cont home lactulose for ammonia 141  SMV thrombosis Nonocclusive - diagnosed last  hospitalization  Code Status: FULL Family Communication: no family present at time of exam Disposition Plan: SDU   Consultants: none  Procedures: Conley/6 diagnostic paracentesis   Antibiotics: Cefepime Conley/6 > Aztreonam Conley/6 Vanc Conley/6  DVT prophylaxis: SCDs  Objective: Blood pressure 97/51, pulse 87, temperature 98.Conley F (36.7 C), temperature source Oral, resp. rate 17, height 4\' 11"  (Conley.499 m), weight 45.61 kg (100 lb 8.8 oz), SpO2 99 %.  Intake/Output Summary (Last 24 hours) at 02/04/15 1041 Last data filed at 02/04/15 0000  Gross per 24 hour  Intake    545 ml  Output      0 ml  Net    545 ml   Exam: General: No acute respiratory distress - alert and conversant  Lungs: Clear to auscultation bilaterally without wheezes or crackles Cardiovascular: Regular rate and rhythm without murmur gallop or rub normal S1 and S2 Abdomen: very tender to soft palpation th/o - only modest ascites - only mildly protuberant - no mass   Extremities: No significant cyanosis, clubbing, or edema bilateral lower extremities  Data Reviewed:  Basic Metabolic Panel:  Recent Labs Lab 02/02/15 1833 02/03/15 0700 02/03/15 0830 02/04/15 0344  NA 128* 131*  --  129*  K 3.0* 2.9*  --  4.Conley  CL 93* 95*  --  102  CO2 25 25  --  20*  GLUCOSE 106* 115*  --  120*  BUN 6 5*  --  6  CREATININE 0.36* 0.86  --  0.74  CALCIUM 8.Conley* 8.3*  --  7.6*  MG  --   --  Conley.6*  --    CBC:  Recent Labs Lab 02/02/15 1833 02/03/15 0700 02/04/15 0344  WBC 4.Conley 9.5 4.9  NEUTROABS  --  8.4*  --   HGB 9.9* 10.2* 8.5*  HCT 30.2* 30.3* 26.4*  MCV 103.8* 101.7* 103.5*  PLT 73* 74* 53*   Liver Function Tests:  Recent Labs Lab 02/02/15 1833 02/03/15 0700 02/04/15 0344  AST 127* 106* 61*  ALT 55* 51 36  ALKPHOS 583* 521* 354*  BILITOT 15.9* 16.5* 16.6*  PROT 6.Conley* 5.7* 4.6*  ALBUMIN 2.9* 2.7* 2.0*    Recent Labs Lab 02/02/15 1833  LIPASE 55*    Recent Labs Lab 02/03/15 0700  AMMONIA 141*     Coags:  Recent Labs Lab 02/03/15 2314  INR 2.85*    Recent Labs Lab 02/03/15 2314  APTT 41*    Recent Results (from the past 240 hour(s))  Culture, blood (routine x 2)     Status: None (Preliminary result)   Collection Time: 02/03/15  7:00 AM  Result Value Ref Range Status   Specimen Description BLOOD RIGHT ANTECUBITAL  Final   Special Requests BOTTLES DRAWN AEROBIC AND ANAEROBIC  Final   Culture NO GROWTH Conley DAY  Final   Report Status PENDING  Incomplete  Culture, blood (routine x 2)     Status: None (Preliminary result)   Collection Time: 02/03/15  7:00 AM  Result Value Ref Range Status   Specimen Description BLOOD LEFT FOREARM  Final   Special Requests BOTTLES DRAWN AEROBIC AND ANAEROBIC  Final   Culture NO GROWTH Conley DAY  Final   Report Status PENDING  Incomplete  Urine culture     Status: None   Collection Time: 02/03/15  7:25 AM  Result Value Ref Range Status   Specimen Description URINE, CLEAN CATCH  Final   Special Requests NONE  Final   Culture MULTIPLE SPECIES PRESENT, SUGGEST RECOLLECTION  Final   Report Status 02/04/2015 FINAL  Final  MRSA PCR Screening     Status: None   Collection Time: 02/03/15 10:30 PM  Result Value Ref Range Status   MRSA by PCR NEGATIVE NEGATIVE Final    Comment:        The GeneXpert MRSA Assay (FDA approved for NASAL specimens only), is one component of a comprehensive MRSA colonization surveillance program. It is not intended to diagnose MRSA infection nor to guide or monitor treatment for MRSA infections.      Studies:   Recent x-ray studies have been reviewed in detail by the Attending Physician  Scheduled Meds:  Scheduled Meds: . azaTHIOprine  25 mg Oral Daily  . carvedilol  3.125 mg Oral BID WC  . ceFEPime (MAXIPIME) IV  2 g Intravenous Q12H  . furosemide  20 mg Oral BID  . ketorolac  15 mg Intravenous 4 times per day  . lactulose  20 g Oral TID  . metoCLOPramide  5 mg Oral BID  . pantoprazole  40  mg Oral Daily  . predniSONE  5 mg Oral Q breakfast  . spironolactone  150 mg Oral Daily  . ursodiol  300 mg Oral BID    Time spent on care of this patient: 35 mins   MCCLUNG,JEFFREY T , MD   [facility] Office  [phone] Pager - Text Page per Loretha Stapler as per below:  On-Call/Text Page:      [email or web address]      password [facility]  If 7PM-7AM, please contact night-coverage [email or web address] Password [email or web address] Conley/07/2015, 10:41 AM   LOS: Conley day

## 2015-02-04 NOTE — Progress Notes (Signed)
Utilization review completed.  

## 2015-02-05 DIAGNOSIS — R188 Other ascites: Secondary | ICD-10-CM

## 2015-02-05 LAB — COMPREHENSIVE METABOLIC PANEL
ALBUMIN: 1.7 g/dL — AB (ref 3.5–5.0)
ALT: 32 U/L (ref 14–54)
AST: 31 U/L (ref 15–41)
Alkaline Phosphatase: 312 U/L — ABNORMAL HIGH (ref 38–126)
Anion gap: 5 (ref 5–15)
BUN: 9 mg/dL (ref 6–20)
CHLORIDE: 102 mmol/L (ref 101–111)
CO2: 20 mmol/L — ABNORMAL LOW (ref 22–32)
CREATININE: 0.85 mg/dL (ref 0.44–1.00)
Calcium: 7.6 mg/dL — ABNORMAL LOW (ref 8.9–10.3)
GFR calc Af Amer: >60 mL/min (ref >60)
GFR calc non Af Amer: >60 mL/min (ref >60)
GLUCOSE: 125 mg/dL — AB (ref 65–99)
Potassium: 4.3 mmol/L (ref 3.5–5.1)
Sodium: 127 mmol/L — ABNORMAL LOW (ref 135–145)
TOTAL PROTEIN: 4.4 g/dL — AB (ref 6.5–8.1)
Total Bilirubin: 14.5 mg/dL — ABNORMAL HIGH (ref 0.3–1.2)

## 2015-02-05 LAB — CBC
HCT: 23.1 % — ABNORMAL LOW (ref 36.0–46.0)
Hemoglobin: 7.5 g/dL — ABNORMAL LOW (ref 12.0–15.0)
MCH: 33.2 pg (ref 26.0–34.0)
MCHC: 32.5 g/dL (ref 30.0–36.0)
MCV: 102.2 fL — ABNORMAL HIGH (ref 78.0–100.0)
Platelets: 56 10*3/uL — ABNORMAL LOW (ref 150–400)
RBC: 2.26 MIL/uL — AB (ref 3.87–5.11)
RDW: 15 % (ref 11.5–15.5)
WBC: 6.9 10*3/uL (ref 4.0–10.5)

## 2015-02-05 LAB — AMMONIA: AMMONIA: 148 umol/L — AB (ref 9–35)

## 2015-02-05 LAB — MAGNESIUM: MAGNESIUM: 1.6 mg/dL — AB (ref 1.7–2.4)

## 2015-02-05 MED ORDER — CIPROFLOXACIN HCL 500 MG PO TABS
500.0000 mg | ORAL_TABLET | Freq: Two times a day (BID) | ORAL | Status: DC
  Administered 2015-02-06: 500 mg via ORAL
  Filled 2015-02-05 (×3): qty 1

## 2015-02-05 MED ORDER — MAGNESIUM SULFATE 2 GM/50ML IV SOLN
2.0000 g | Freq: Once | INTRAVENOUS | Status: AC
  Administered 2015-02-05: 2 g via INTRAVENOUS
  Filled 2015-02-05: qty 50

## 2015-02-05 MED ORDER — FUROSEMIDE 10 MG/ML IJ SOLN
60.0000 mg | Freq: Two times a day (BID) | INTRAMUSCULAR | Status: DC
  Administered 2015-02-05 – 2015-02-06 (×3): 60 mg via INTRAVENOUS
  Filled 2015-02-05 (×3): qty 6

## 2015-02-05 MED ORDER — WHITE PETROLATUM GEL
Status: AC
  Administered 2015-02-05: 17:00:00
  Filled 2015-02-05: qty 1

## 2015-02-05 MED ORDER — FUROSEMIDE 10 MG/ML IJ SOLN: 40.0000 mg | Freq: Two times a day (BID) | INTRAMUSCULAR | Status: DC

## 2015-02-05 NOTE — Progress Notes (Signed)
[facility] TEAM 1 - Stepdown/ICU TEAM PROGRESS NOTE  ALEJANDRO ADCOX WUJ:[id number] DOB: 02-22-1990 DOA: 02/03/2015 PCP: Woodfin Ganja, MD  Admit HPI / Brief Narrative: 25 yo female with a history of autoimmune hepatitis w/ cirrhosis, bacterial SBP, and GI bleeding who presented to the ED with the chief complaint of worsening abdominal pain with fever and chills. Evaluation in the ED was concerning for sepsis.  In the emergency department she had a rectal temp of 102.3, heart rate of 140 with blood pressure 115/100. She was given 2 L of fluids and antibiotics. In addition bedside paracentesis with removal of 20 mL of fluid was carried out in the ED.  HPI/Subjective: The patient is feeling much better today.  She feels that her abdomen is beginning to swell again.  She states her abdominal pain has significantly improved.  She is tolerating her current diet.  She denies fevers chills shortness of breath nausea vomiting or headache.  Assessment/Plan:  Sepsis due to SBP  ascites fluid w/ 1242 WBC - cont empiric abx coverage w/ plan to transition to oral tx tomorrow - clinically much improved - appears ascitic fluid was not sent for culture?  Ascites she has scheduled paracentesis every 2 weeks - s/p 6L removed 02/03/15 - ascites reaccumulating rapidly due to volume resuscitation for sepsis - increase diuresis and follow - no resp distress at this time   Recent GI bleed with esophageal varices status post banding - followed by [facility] - Hgb trending down w/ volume expansion - no evidence of blood loss - diurese and follow    Hyponatremia Chronic - follow trend w/ increase in diuretic tx    Hypokalemia  Corrected w/ replacement - follow  Hypomagnesemia Replace and follow trend   Abnormal UA High squam epi count suggests dirty collection - cx not helpful - most pathogens would be covered by current SBP tx regardless   Autoimmune hepatitis Continue home meds  Coagulopathy - Thrombocytopenia    Due to above - no evidence of spontaneous bleeding   End-stage liver disease chart review indicates at this point she is not a transplant candidate because of noncompliance and very poor social situation - cont home lactulose for ammonia 141  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 rate 16, height 4\' 11"  (1.499 m), weight 45.61 kg (100 lb 8.8 oz), SpO2 99 %.  Intake/Output Summary (Last 24 hours) at 02/05/15 1018 Last data filed at 02/04/15 1800  Gross per 24 hour  Intake   1545 ml  Output    402 ml  Net   1143 ml   Exam: General: No acute respiratory distress - alert and conversant  Lungs: Clear to auscultation bilaterally without crackles  Cardiovascular: Regular rate and rhythm without murmur gallop or rub  Abdomen: only mildly tender to palpation today - ascites increased - protuberant - no mass   Extremities: No significant cyanosis, clubbing, or edema bilateral lower extremities  Data Reviewed:  Basic Metabolic Panel:  Recent Labs Lab 02/02/15 1833 02/03/15 0700 02/03/15 0830 02/04/15 0344 02/05/15 0335  NA 128* 131*  --  129* 127*  K 3.0* 2.9*  --  4.1 4.3  CL 93* 95*  --  102 102  CO2 25 25  --  20* 20*  GLUCOSE 106* 115*  --  120* 125*  BUN 6 5*  --  6 9  CREATININE 0.36* 0.86  --  0.74 0.85  CALCIUM 8.1* 8.3*  --  7.6* 7.6*  MG  --   --  1.6*  --  1.6*   CBC:  Recent Labs Lab 02/02/15 1833 02/03/15 0700 02/04/15 0344 02/05/15 0335  WBC 4.1 9.5 4.9 6.9  NEUTROABS  --  8.4*  --   --   HGB 9.9* 10.2* 8.5* 7.5*  HCT 30.2* 30.3* 26.4* 23.1*   MCV 103.8* 101.7* 103.5* 102.2*  PLT 73* 74* 53* 56*   Liver Function Tests:  Recent Labs Lab 02/02/15 1833 02/03/15 0700 02/04/15 0344 02/05/15 0335  AST 127* 106* 61* 31  ALT 55* 51 36 32  ALKPHOS 583* 521* 354* 312*  BILITOT 15.9* 16.5* 16.6* 14.5*  PROT 6.1* 5.7* 4.6* 4.4*  ALBUMIN 2.9* 2.7* 2.0* 1.7*    Recent Labs Lab 02/02/15 1833  LIPASE 55*    Recent Labs Lab 02/03/15 0700 02/05/15 0335  AMMONIA 141* 148*    Coags:  Recent Labs Lab 02/03/15 2314  INR 2.85*    Recent Labs Lab 02/03/15 2314  APTT 41*    Recent Results (from the past 240 hour(s))  Culture, blood (routine x 2)     Status: None (Preliminary result)   Collection Time: 02/03/15  7:00 AM  Result Value Ref Range Status   Specimen Description BLOOD RIGHT ANTECUBITAL  Final   Special Requests BOTTLES DRAWN AEROBIC AND ANAEROBIC 5MLS  Final   Culture NO GROWTH 1 DAY  Final   Report Status PENDING  Incomplete  Culture, blood (routine x 2)     Status: None (Preliminary result)   Collection Time: 02/03/15  7:00 AM  Result Value Ref Range Status   Specimen Description BLOOD LEFT FOREARM  Final   Special Requests BOTTLES DRAWN AEROBIC AND ANAEROBIC 5MLS  Final   Culture NO GROWTH 1 DAY  Final   Report Status PENDING  Incomplete  Urine culture     Status: None   Collection Time: 02/03/15  7:25 AM  Result Value Ref Range Status   Specimen Description URINE, CLEAN CATCH  Final   Special Requests NONE  Final   Culture MULTIPLE SPECIES PRESENT, SUGGEST RECOLLECTION  Final   Report Status 02/04/2015 FINAL  Final  MRSA PCR Screening     Status: None   Collection Time: 02/03/15 10:30 PM  Result Value Ref Range Status   MRSA by PCR NEGATIVE NEGATIVE Final    Comment:        The GeneXpert MRSA Assay (FDA approved for NASAL specimens only), is one component of a comprehensive MRSA colonization surveillance program. It is not intended to diagnose MRSA infection nor to guide or monitor  treatment for MRSA infections.      Studies:   Recent x-ray studies have been reviewed in detail by the Attending Physician  Scheduled Meds:  Scheduled Meds: . azaTHIOprine  25 mg Oral Daily  . carvedilol  3.125 mg Oral BID WC  . ceFEPime (MAXIPIME) IV  2 g Intravenous Q12H  . furosemide  20 mg Oral BID  . lactulose  20 g Oral TID  . metoCLOPramide  5 mg Oral BID  . pantoprazole  40 mg Oral Daily  . predniSONE  5 mg Oral Q breakfast  .  spironolactone  150 mg Oral Daily  . ursodiol  300 mg Oral BID    Time spent on care of this patient: 35 mins   MCCLUNG,JEFFREY T , MD   [facility] Office  [phone] Pager - Text Page per Loretha Stapler as per below:  On-Call/Text Page:      [email or web address]      password [email or web address]  If 7PM-7AM, please contact night-coverage [email or web address] Password [email or web address] 02/05/2015, 10:18 AM   LOS: 2 days

## 2015-02-06 LAB — COMPREHENSIVE METABOLIC PANEL
ALBUMIN: 1.9 g/dL — AB (ref 3.5–5.0)
ALK PHOS: 324 U/L — AB (ref 38–126)
ALT: 29 U/L (ref 14–54)
AST: 30 U/L (ref 15–41)
Anion gap: 5 (ref 5–15)
BUN: 8 mg/dL (ref 6–20)
CALCIUM: 7.7 mg/dL — AB (ref 8.9–10.3)
CHLORIDE: 98 mmol/L — AB (ref 101–111)
CO2: 22 mmol/L (ref 22–32)
CREATININE: 0.91 mg/dL (ref 0.44–1.00)
GFR calc Af Amer: >60 mL/min (ref >60)
GFR calc non Af Amer: >60 mL/min (ref >60)
GLUCOSE: 99 mg/dL (ref 65–99)
Potassium: 4 mmol/L (ref 3.5–5.1)
SODIUM: 125 mmol/L — AB (ref 135–145)
Total Bilirubin: 13.9 mg/dL — ABNORMAL HIGH (ref 0.3–1.2)
Total Protein: 4.8 g/dL — ABNORMAL LOW (ref 6.5–8.1)

## 2015-02-06 LAB — CBC
HCT: 24.4 % — ABNORMAL LOW (ref 36.0–46.0)
Hemoglobin: 8.2 g/dL — ABNORMAL LOW (ref 12.0–15.0)
MCH: 33.9 pg (ref 26.0–34.0)
MCHC: 33.6 g/dL (ref 30.0–36.0)
MCV: 100.8 fL — AB (ref 78.0–100.0)
PLATELETS: 67 10*3/uL — AB (ref 150–400)
RBC: 2.42 MIL/uL — ABNORMAL LOW (ref 3.87–5.11)
RDW: 14.8 % (ref 11.5–15.5)
WBC: 6.3 10*3/uL (ref 4.0–10.5)

## 2015-02-06 LAB — PATHOLOGIST SMEAR REVIEW

## 2015-02-06 LAB — MAGNESIUM: Magnesium: 1.5 mg/dL — ABNORMAL LOW (ref 1.7–2.4)

## 2015-02-06 LAB — PROTIME-INR
INR: 1.88 — ABNORMAL HIGH (ref 0.00–1.49)
PROTHROMBIN TIME: 21.6 s — AB (ref 11.6–15.2)

## 2015-02-06 MED ORDER — CIPROFLOXACIN HCL 500 MG PO TABS: 500.0000 mg | ORAL_TABLET | Freq: Two times a day (BID) | ORAL | Status: AC

## 2015-02-06 MED ORDER — SULFAMETHOXAZOLE-TRIMETHOPRIM 800-160 MG PO TABS: 1.0000 | ORAL_TABLET | Freq: Every day | ORAL | Status: DC

## 2015-02-06 MED ORDER — MAGNESIUM SULFATE 50 % IJ SOLN
3.0000 g | Freq: Once | INTRAVENOUS | Status: AC
  Administered 2015-02-06: 3 g via INTRAVENOUS
  Filled 2015-02-06 (×2): qty 6

## 2015-02-06 NOTE — Discharge Summary (Signed)
DISCHARGE SUMMARY  Melanie Conley  MR#: [id number]  DOB:Feb 10, 1990  Date of Admission: 02/03/2015 Date of Discharge: 02/06/2015  Attending Physician:MCCLUNG,JEFFREY T  Patient's QMV:HQIONGE,XBMW, MD  Consults:  none  Disposition: D/C home   Follow-up Appts:     Follow-up Information    Follow up with [name],[name], MD. Schedule an appointment as soon as possible for a visit in 5 days.   Specialty:  Internal Medicine     Tests Needing Follow-up: - suggest f/u Mg level in 5-7 days - keep regular scheduled paracentesis appointments   Discharge Diagnoses: Sepsis due to SBP  Ascites Recent GI bleed with esophageal varices Hyponatremia Hypokalemia  Hypomagnesemia Abnormal UA Autoimmune hepatitis Coagulopathy - Thrombocytopenia  End-stage liver disease SMV thrombosis  Procedures: 1/6 paracentesis - 6L removed   Initial presentation: 25 yo female with a history of autoimmune hepatitis w/ cirrhosis, bacterial SBP, and GI bleeding who presented to the ED with the chief complaint of worsening abdominal pain with fever and chills. Evaluation in the ED was concerning for sepsis.  In the emergency department she had a rectal temp of 102.3, heart rate of 140 with blood pressure 115/100. She was given 2 L of fluids and antibiotics. In addition bedside paracentesis with removal of 20 mL of fluid was carried out in the ED.  Hospital Course:  Sepsis due to SBP  ascites fluid w/ 1242 WBC - cont empiric treatment dose abx coverage to complete 5 days of tx then transition to SBP prophy dose - clinically much improved - appears ascitic fluid was not sent for culture? - no persisting evidence of sepsis - abdom exam entirely benign - tolerating diet w/o difficulty   Ascites she has scheduled paracentesis every 2 weeks - s/p 6L removed 02/03/15 - ascites reaccumulated rapidly due to volume resuscitation for sepsis - diuresed during hospital stay - no resp distress at time of d/c and abdom  soft/not tense though ascites present   Recent GI bleed with esophageal varices status post banding - followed by [facility] - Hgb trended down w/ volume expansion but then stabilized around 8.0 - no evidence of blood loss   Hyponatremia Chronic - follow trend w/ increase in diuretic tx   Hypokalemia  Corrected   Hypomagnesemia Replace via IV during stay - added oral Mg at time of d/c - suggest f/u Mg level in 5-7 days  Abnormal UA High squam epi count suggests dirty collection - cx not helpful - most pathogens would be covered by SBP tx regardless - no urinary sx at time of d/c   Autoimmune hepatitis Continue home meds  Coagulopathy - Thrombocytopenia  Due to above - no evidence of spontaneous bleeding   End-stage liver disease chart review indicates at this point she is not a transplant candidate because of noncompliance and very poor social situation - cont home lactulose - mental status intact at time of d/c w/o lethargy   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     carvedilol 3.125 MG tablet  Commonly known as:  COREG  Take 3.125 mg by mouth 2 (two) times daily with a meal.     ciprofloxacin 500 MG tablet  Commonly known as:  CIPRO  Take 1 tablet (500 mg  total) by mouth 2 (two) times daily.     furosemide 20 MG tablet  Commonly known as:  LASIX  Take 2 tablets (40 mg total) by mouth daily.     lactulose 10 GM/15ML solution  Commonly known as:  CHRONULAC  Take 30 mLs (20 g total) by mouth 2 (two) times daily.     lidocaine 2 % solution  Commonly known as:  XYLOCAINE  Use as directed 15 mLs in the mouth or throat every 4 (four) hours as needed for mouth pain.     metoCLOPramide 5 MG tablet  Commonly known as:  REGLAN  Take 5 mg by mouth 2 (two) times daily.      omeprazole 40 MG capsule  Commonly known as:  PRILOSEC  Take 40 mg by mouth daily.     ondansetron 4 MG tablet  Commonly known as:  ZOFRAN  Take 1 tablet (4 mg total) by mouth every 8 (eight) hours as needed for nausea or vomiting.     oxyCODONE 5 MG immediate release tablet  Commonly known as:  ROXICODONE  Take 1 tablet (5 mg total) by mouth every 4 (four) hours as needed for severe pain.     predniSONE 5 MG tablet  Commonly known as:  DELTASONE  Take 5 mg by mouth daily with breakfast.     spironolactone 50 MG tablet  Commonly known as:  ALDACTONE  Take 50 mg by mouth 3 (three) times daily.     sulfamethoxazole-trimethoprim 800-160 MG tablet  Commonly known as:  BACTRIM DS,SEPTRA DS  Take 1 tablet by mouth daily.  Start taking on:  02/08/2015     ursodiol 300 MG capsule  Commonly known as:  ACTIGALL  Take 300 mg by mouth 2 (two) times daily.        Day of Discharge BP 98/54 mmHg  Pulse 88  Temp(Src) 98.1 F (36.7 C) (Oral)  Resp 14  Ht 4\' 11"  (1.499 m)  Wt 45.61 kg (100 lb 8.8 oz)  BMI 20.30 kg/m2  SpO2 99%  Physical Exam: General: No acute respiratory distress Lungs: Clear to auscultation bilaterally without wheezes or crackles Cardiovascular: Regular rate and rhythm without murmur gallop or rub normal S1 and S2 Abdomen: Nontender, modestly distended w/ ascites but soft, bowel sounds positive, no rebound, no ascites, no appreciable mass Extremities: No significant cyanosis, clubbing, or edema bilateral lower extremities  Basic Metabolic Panel:  Recent Labs Lab 02/02/15 1833 02/03/15 0700 02/03/15 0830 02/04/15 0344 02/05/15 0335 02/06/15 0404  NA 128* 131*  --  129* 127* 125*  K 3.0* 2.9*  --  4.1 4.3 4.0  CL 93* 95*  --  102 102 98*  CO2 25 25  --  20* 20* 22  GLUCOSE 106* 115*  --  120* 125* 99  BUN 6 5*  --  6 9 8   CREATININE 0.36* 0.86  --  0.74 0.85 0.91  CALCIUM 8.1* 8.3*  --  7.6* 7.6* 7.7*  MG  --   --  1.6*  --  1.6* 1.5*    Liver  Function Tests:  Recent Labs Lab 02/02/15 1833 02/03/15 0700 02/04/15 0344 02/05/15 0335 02/06/15 0404  AST 127* 106* 61* 31 30  ALT 55* 51 36 32 29  ALKPHOS 583* 521* 354* 312* 324*  BILITOT 15.9* 16.5* 16.6* 14.5* 13.9*  PROT 6.1* 5.7* 4.6* 4.4* 4.8*  ALBUMIN 2.9* 2.7* 2.0* 1.7* 1.9*    Recent Labs Lab 02/02/15 1833  LIPASE 55*  Recent Labs Lab 02/03/15 0700 02/05/15 0335  AMMONIA 141* 148*   Coags:  Recent Labs Lab 02/03/15 2314 02/06/15 0404  INR 2.85* 1.88*   CBC:  Recent Labs Lab 02/02/15 1833 02/03/15 0700 02/04/15 0344 02/05/15 0335 02/06/15 0404  WBC 4.1 9.5 4.9 6.9 6.3  NEUTROABS  --  8.4*  --   --   --   HGB 9.9* 10.2* 8.5* 7.5* 8.2*  HCT 30.2* 30.3* 26.4* 23.1* 24.4*  MCV 103.8* 101.7* 103.5* 102.2* 100.8*  PLT 73* 74* 53* 56* 67*    Recent Results (from the past 240 hour(s))  Culture, blood (routine x 2)     Status: None (Preliminary result)   Collection Time: 02/03/15  7:00 AM  Result Value Ref Range Status   Specimen Description BLOOD RIGHT ANTECUBITAL  Final   Special Requests BOTTLES DRAWN AEROBIC AND ANAEROBIC  Final   Culture NO GROWTH 2 DAYS  Final   Report Status PENDING  Incomplete  Culture, blood (routine x 2)     Status: None (Preliminary result)   Collection Time: 02/03/15  7:00 AM  Result Value Ref Range Status   Specimen Description BLOOD LEFT FOREARM  Final   Special Requests BOTTLES DRAWN AEROBIC AND ANAEROBIC  Final   Culture NO GROWTH 2 DAYS  Final   Report Status PENDING  Incomplete  Urine culture     Status: None   Collection Time: 02/03/15  7:25 AM  Result Value Ref Range Status   Specimen Description URINE, CLEAN CATCH  Final   Special Requests NONE  Final   Culture MULTIPLE SPECIES PRESENT, SUGGEST RECOLLECTION  Final   Report Status 02/04/2015 FINAL  Final  MRSA PCR Screening     Status: None   Collection Time: 02/03/15 10:30 PM  Result Value Ref Range Status   MRSA by PCR NEGATIVE NEGATIVE  Final    Comment:        The GeneXpert MRSA Assay (FDA approved for NASAL specimens only), is one component of a comprehensive MRSA colonization surveillance program. It is not intended to diagnose MRSA infection nor to guide or monitor treatment for MRSA infections.     Time spent in discharge (includes decision making & examination of pt): >35 minutes  02/06/2015, 12:05 PM   Lonia Blood, MD [facility] Office  [phone] Pager [phone]  On-Call/Text Page:      [email or web address]      password [facility]

## 2015-02-06 NOTE — Discharge Instructions (Signed)
Ascites °Ascites is a collection of excess fluid in the abdomen. Ascites can range from mild to severe. It can get worse without treatment. °CAUSES °Possible causes include: °· Cirrhosis. This is the most common cause of ascites. °· Infection or inflammation in the abdomen. °· Cancer in the abdomen. °· Heart failure. °· Kidney disease. °· Inflammation of the pancreas. °· Clots in the veins of the liver. °SIGNS AND SYMPTOMS °Signs and symptoms may include: °· A feeling of fullness in your abdomen. This is common. °· An increase in the size of your abdomen or your waist. °· Swelling in your legs. °· Swelling of the scrotum in men. °· Difficulty breathing. °· Abdominal pain. °· Sudden weight gain. °If the condition is mild, you may not have symptoms. °DIAGNOSIS °To make a diagnosis, your health care provider will: °· Ask about your medical history. °· Perform a physical exam. °· Order imaging tests, such as an ultrasound or CT scan of your abdomen. °TREATMENT °Treatment depends on the cause of the ascites. It may include: °· Taking a pill to make you urinate. This is called a water pill (diuretic pill). °· Strictly reducing your salt (sodium) intake. Salt can cause extra fluid to be kept in the body, and this makes ascites worse. °· Having a procedure to remove fluid from your abdomen (paracentesis). °· Having a procedure to transfer fluid from your abdomen into a vein. °· Having a procedure that connects two of the major veins within your liver and relieves pressure on your liver (TIPS procedure). °Ascites may go away or improve with treatment of the condition that caused it.  °HOME CARE INSTRUCTIONS °· Keep track of your weight. To do this, weigh yourself at the same time every day and record your weight. °· Keep track of how much you drink and any changes in the amount you urinate. °· Follow any instructions that your health care provider gives you about how much to drink. °· Try not to eat salty (high-sodium)  foods. °· Take medicines only as directed by your health care provider. °· Keep all follow-up visits as directed by your health care provider. This is important. °· Report any changes in your health to your health care provider, especially if you develop new symptoms or your symptoms get worse. °SEEK MEDICAL CARE IF: °· Your gain more than 3 pounds in 3 days. °· Your abdominal size or your waist size increases. °· You have new swelling in your legs. °· The swelling in your legs gets worse. °SEEK IMMEDIATE MEDICAL CARE IF: °· You develop a fever. °· You develop confusion. °· You develop new or worsening difficulty breathing. °· You develop new or worsening abdominal pain. °· You develop new or worsening swelling in the scrotum (in men). °  °This information is not intended to replace advice given to you by your health care provider. Make sure you discuss any questions you have with your health care provider. °  °Document Released: 01/14/2005 Document Revised: 02/04/2014 Document Reviewed: 08/13/2013 °Elsevier Interactive Patient Education ©2016 [employer] ° °

## 2015-02-06 NOTE — Progress Notes (Signed)
Upon pt d/c, IV d/c, AVS and prescription explained to patient's satisfaction. Pt gathered belongings and left with mom.

## 2015-02-08 LAB — CULTURE, BLOOD (ROUTINE X 2)
CULTURE: NO GROWTH
Culture: NO GROWTH

## 2015-02-15 ENCOUNTER — Encounter (HOSPITAL_COMMUNITY): Payer: Self-pay | Admitting: Family Medicine

## 2015-02-15 ENCOUNTER — Emergency Department (HOSPITAL_COMMUNITY)
Admission: EM | Admit: 2015-02-15 | Discharge: 2015-02-15 | Disposition: A | Discharge status: Home / Self Care | Payer: BC Managed Care – PPO | Attending: Emergency Medicine | Admitting: Emergency Medicine

## 2015-02-15 ENCOUNTER — Emergency Department (HOSPITAL_COMMUNITY): Payer: BC Managed Care – PPO

## 2015-02-15 DIAGNOSIS — Z3202 Encounter for pregnancy test, result negative: Secondary | ICD-10-CM | POA: Diagnosis not present

## 2015-02-15 DIAGNOSIS — Z8659 Personal history of other mental and behavioral disorders: Secondary | ICD-10-CM | POA: Insufficient documentation

## 2015-02-15 DIAGNOSIS — Z9884 Bariatric surgery status: Secondary | ICD-10-CM | POA: Insufficient documentation

## 2015-02-15 DIAGNOSIS — S3991XA Unspecified injury of abdomen, initial encounter: Secondary | ICD-10-CM | POA: Diagnosis not present

## 2015-02-15 DIAGNOSIS — R42 Dizziness and giddiness: Secondary | ICD-10-CM | POA: Diagnosis not present

## 2015-02-15 DIAGNOSIS — Z79899 Other long term (current) drug therapy: Secondary | ICD-10-CM | POA: Insufficient documentation

## 2015-02-15 DIAGNOSIS — Y9389 Activity, other specified: Secondary | ICD-10-CM | POA: Insufficient documentation

## 2015-02-15 DIAGNOSIS — R1084 Generalized abdominal pain: Secondary | ICD-10-CM

## 2015-02-15 DIAGNOSIS — K754 Autoimmune hepatitis: Secondary | ICD-10-CM | POA: Diagnosis not present

## 2015-02-15 DIAGNOSIS — Z87891 Personal history of nicotine dependence: Secondary | ICD-10-CM | POA: Diagnosis not present

## 2015-02-15 DIAGNOSIS — Z8619 Personal history of other infectious and parasitic diseases: Secondary | ICD-10-CM | POA: Insufficient documentation

## 2015-02-15 DIAGNOSIS — Z8679 Personal history of other diseases of the circulatory system: Secondary | ICD-10-CM | POA: Diagnosis not present

## 2015-02-15 DIAGNOSIS — Z7952 Long term (current) use of systemic steroids: Secondary | ICD-10-CM | POA: Insufficient documentation

## 2015-02-15 DIAGNOSIS — Z792 Long term (current) use of antibiotics: Secondary | ICD-10-CM | POA: Diagnosis not present

## 2015-02-15 DIAGNOSIS — Z880 Allergy status to penicillin: Secondary | ICD-10-CM | POA: Diagnosis not present

## 2015-02-15 DIAGNOSIS — Y9289 Other specified places as the place of occurrence of the external cause: Secondary | ICD-10-CM | POA: Diagnosis not present

## 2015-02-15 DIAGNOSIS — Y998 Other external cause status: Secondary | ICD-10-CM | POA: Diagnosis not present

## 2015-02-15 DIAGNOSIS — W01198A Fall on same level from slipping, tripping and stumbling with subsequent striking against other object, initial encounter: Secondary | ICD-10-CM | POA: Insufficient documentation

## 2015-02-15 DIAGNOSIS — Z8742 Personal history of other diseases of the female genital tract: Secondary | ICD-10-CM | POA: Insufficient documentation

## 2015-02-15 LAB — CBC WITH DIFFERENTIAL/PLATELET
Basophils Absolute: 0 10*3/uL (ref 0.0–0.1)
Basophils Relative: 0 %
EOS ABS: 0 10*3/uL (ref 0.0–0.7)
Eosinophils Relative: 0 %
HEMATOCRIT: 23.2 % — AB (ref 36.0–46.0)
HEMOGLOBIN: 8.1 g/dL — AB (ref 12.0–15.0)
LYMPHS ABS: 0.5 10*3/uL — AB (ref 0.7–4.0)
LYMPHS PCT: 6 %
MCH: 34.3 pg — AB (ref 26.0–34.0)
MCHC: 34.9 g/dL (ref 30.0–36.0)
MCV: 98.3 fL (ref 78.0–100.0)
MONOS PCT: 8 %
Monocytes Absolute: 0.6 10*3/uL (ref 0.1–1.0)
NEUTROS ABS: 6.5 10*3/uL (ref 1.7–7.7)
Neutrophils Relative %: 86 %
Platelets: 63 10*3/uL — ABNORMAL LOW (ref 150–400)
RBC: 2.36 MIL/uL — AB (ref 3.87–5.11)
RDW: 15.1 % (ref 11.5–15.5)
WBC: 7.6 10*3/uL (ref 4.0–10.5)

## 2015-02-15 LAB — URINALYSIS, ROUTINE W REFLEX MICROSCOPIC
GLUCOSE, UA: NEGATIVE mg/dL
HGB URINE DIPSTICK: NEGATIVE
Ketones, ur: NEGATIVE mg/dL
Nitrite: POSITIVE — AB
Protein, ur: NEGATIVE mg/dL
SPECIFIC GRAVITY, URINE: 1.021 (ref 1.005–1.030)
pH: 6 (ref 5.0–8.0)

## 2015-02-15 LAB — COMPREHENSIVE METABOLIC PANEL
ALK PHOS: 213 U/L — AB (ref 38–126)
ALT: 19 U/L (ref 14–54)
AST: 31 U/L (ref 15–41)
Albumin: 3.7 g/dL (ref 3.5–5.0)
Anion gap: 10 (ref 5–15)
BILIRUBIN TOTAL: 21 mg/dL — AB (ref 0.3–1.2)
BUN: 13 mg/dL (ref 6–20)
CALCIUM: 9.1 mg/dL (ref 8.9–10.3)
CO2: 20 mmol/L — AB (ref 22–32)
CREATININE: 0.61 mg/dL (ref 0.44–1.00)
Chloride: 98 mmol/L — ABNORMAL LOW (ref 101–111)
GFR calc non Af Amer: >60 mL/min (ref >60)
Glucose, Bld: 77 mg/dL (ref 65–99)
Potassium: 3.4 mmol/L — ABNORMAL LOW (ref 3.5–5.1)
SODIUM: 128 mmol/L — AB (ref 135–145)
TOTAL PROTEIN: 6.2 g/dL — AB (ref 6.5–8.1)

## 2015-02-15 LAB — URINE MICROSCOPIC-ADD ON

## 2015-02-15 LAB — PREGNANCY, URINE: PREG TEST UR: NEGATIVE

## 2015-02-15 LAB — AMMONIA: AMMONIA: 60 umol/L — AB (ref 9–35)

## 2015-02-15 LAB — PROTIME-INR
INR: 2.94 — ABNORMAL HIGH (ref 0.00–1.49)
Prothrombin Time: 30.2 seconds — ABNORMAL HIGH (ref 11.6–15.2)

## 2015-02-15 MED ORDER — IOHEXOL 300 MG/ML  SOLN
100.0000 mL | Freq: Once | INTRAMUSCULAR | Status: AC | PRN
  Administered 2015-02-15: 80 mL via INTRAVENOUS

## 2015-02-15 MED ORDER — HYDROMORPHONE HCL 1 MG/ML IJ SOLN
0.5000 mg | Freq: Once | INTRAMUSCULAR | Status: AC
  Administered 2015-02-15: 0.5 mg via INTRAVENOUS
  Filled 2015-02-15: qty 1

## 2015-02-15 MED ORDER — HYDROMORPHONE HCL 1 MG/ML IJ SOLN
1.0000 mg | Freq: Once | INTRAMUSCULAR | Status: AC
  Administered 2015-02-15: 1 mg via INTRAVENOUS
  Filled 2015-02-15: qty 1

## 2015-02-15 MED ORDER — MORPHINE SULFATE (PF) 4 MG/ML IV SOLN
4.0000 mg | Freq: Once | INTRAVENOUS | Status: DC
  Filled 2015-02-15: qty 1

## 2015-02-15 NOTE — ED Notes (Signed)
Patient requesting something to help her sleep and for something to drink. Spoke with Madilyn Hook, MD states that she cannot order sleep medication at this time and patient needs to be NPO at this time.

## 2015-02-15 NOTE — Discharge Instructions (Signed)
YOUR TOTAL BILIRUBIN TODAY IS SIGNIFICANTLY ELEVATED OVER WHAT APPEARS TO BE YOUR BASELINE BILIRUBIN. CALL DR. [name] TO ARRANGE FOLLOW UP IN [place] FOR FURTHER MANAGEMENT.

## 2015-02-15 NOTE — ED Notes (Signed)
Discharge instructions and follow up care reviewed with patient. Patient verbalized understanding. 

## 2015-02-15 NOTE — ED Notes (Signed)
Pharmacy tech at bedside 

## 2015-02-15 NOTE — ED Notes (Signed)
Nurse drawing labs. 

## 2015-02-15 NOTE — ED Provider Notes (Signed)
CSN: [id number]     Arrival date & time 02/15/15  0308 History   First MD Initiated Contact with Patient 02/15/15 0320     Chief Complaint  Patient presents with  . Abdominal Pain     (Consider location/radiation/quality/duration/timing/severity/associated sxs/prior Treatment) HPI Comments: Patient with a history of autoimmune hepatitis, cirrhosis, ascites, esophageal varices presents with complaint of abdominal pain after a fall. She states she got dizzy and fell over a couch. She states dizziness is common "with my liver disease". She did not have LOC. She has since had persistent, severe pain in the left side of her abdomen, nausea and vomiting. She reports she was hospitalized earlier this month with an infection but no fever since leaving the hospital.   Patient is a 25 y.o. female presenting with abdominal pain. The history is provided by the patient. No language interpreter was used.  Abdominal Pain Pain location:  LLQ and LUQ Associated symptoms: nausea and vomiting   Associated symptoms: no chest pain, no chills, no fever and no shortness of breath     Past Medical History  Diagnosis Date  . Anemia 2012  . Esophageal varices (HCC) 2015    s/p multiple EGDs and bandings dating to 2015.   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 GIB (gastrointestinal bleeding)   . Cirrhosis (HCC) 2003  . Irregular periods 08/04/2014  . Vaginal Pap smear, abnormal   . Hepatic encephalopathy (HCC)   . Coagulopathy (HCC)   . STD (female)     mutiple. risky sexual behavior.   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 Ascites 2003  . Psychosis    Past Surgical History  Procedure Laterality Date  . Gastric banding port revision    . Tonsillectomy    . Esophagogastroduodenoscopy  07/22/2011    Procedure: ESOPHAGOGASTRODUODENOSCOPY (EGD);  Surgeon: Graylin Shiver, MD;  Location[facility] ENDOSCOPY;  Service: Endoscopy;  Laterality: N/A;  .  Esophagogastroduodenoscopy N/A 09/11/2014    Procedure: ESOPHAGOGASTRODUODENOSCOPY (EGD);  Surgeon: Charlott Rakes, MD;  Location: [facility] ENDOSCOPY;  Service: Endoscopy;  Laterality: N/A;   Family History  Problem Relation Age of Onset  . Adopted: Yes   Social History  Substance Use Topics  . Smoking status: Former Smoker -- 0.50 packs/day for 8 years    Types: Cigarettes  . Smokeless tobacco: Never Used  . Alcohol Use: No   OB History    Gravida Para Term Preterm AB TAB SAB Ectopic Multiple Living       Review of Systems  Constitutional: Negative for fever and chills.  Respiratory: Negative.  Negative for shortness of breath.   Cardiovascular: Negative.  Negative for chest pain.  Gastrointestinal: Positive for nausea, vomiting and abdominal pain.  Musculoskeletal: Negative.  Negative for back pain and neck pain.  Skin: Negative.  Negative for color change and wound.  Neurological: Positive for dizziness. Negative for syncope.      Allergies  Tylenol and Amoxicillin  Home Medications   Prior to Admission medications   Medication Sig Start Date End Date Taking? Authorizing Provider  carvedilol (COREG) 3.125 MG tablet Take 3.125 mg by mouth 2 (two) times daily with a meal.    Yes Historical Provider, MD  furosemide (LASIX) 20 MG tablet Take 2 tablets (40 mg total) by mouth daily. Patient taking differently: Take 20 mg by mouth 2 (two) times daily.  10/12/14  Yes Shanker Levora Dredge, MD  lactulose (CHRONULAC) 10 GM/15ML solution Take 30 mLs (20 g total) by mouth 2 (two) times daily. Patient taking differently: Take 20 g by mouth 3 (three) times daily.  09/13/14  Yes Vilinda Blanks Minor, NP  lidocaine (XYLOCAINE) 2 % solution Use as directed 15 mLs in the mouth or throat every 4 (four) hours as needed for mouth pain.   Yes Historical Provider, MD  metoCLOPramide (REGLAN) 5 MG tablet Take 5 mg by mouth 2 (two) times daily.    Yes Historical Provider, MD  omeprazole  (PRILOSEC) 40 MG capsule Take 40 mg by mouth daily.   Yes Historical Provider, MD  predniSONE (DELTASONE) 5 MG tablet Take 5 mg by mouth daily with breakfast.   Yes Historical Provider, MD  spironolactone (ALDACTONE) 50 MG tablet Take 50 mg by mouth 3 (three) times daily.    Yes Historical Provider, MD  sulfamethoxazole-trimethoprim (BACTRIM DS,SEPTRA DS) 800-160 MG tablet Take 1 tablet by mouth daily. 02/08/15  Yes Lonia Blood, MD  ursodiol (ACTIGALL) 300 MG capsule Take 300 mg by mouth 2 (two) times daily.    Yes Historical Provider, MD  ondansetron (ZOFRAN) 4 MG tablet Take 1 tablet (4 mg total) by mouth every 8 (eight) hours as needed for nausea or vomiting. Patient not taking: Reported on 02/03/2015 01/22/15   Courteney Lyn Mackuen, MD  oxyCODONE (ROXICODONE) 5 MG immediate release tablet Take 1 tablet (5 mg total) by mouth every 4 (four) hours as needed for severe pain. Patient not taking: Reported on 02/15/2015 01/22/15   Courteney Lyn Mackuen, MD   BP 120/85 mmHg  Pulse 106  Temp(Src) 98.7 F (37.1 C) (Oral)  Resp 18  Ht  (1.499 m)  Wt 41.277 kg  BMI 18.37 kg/m2  SpO2 95% Physical Exam  Constitutional: She is oriented to person, place, and time. She appears well-developed and well-nourished.  Frail, cachectic female appearing older than stated age.  HENT:  Head: Normocephalic.  Mouth/Throat: Mucous membranes are dry.  Eyes: No scleral icterus.  Neck: Normal range of motion. Neck supple.  Cardiovascular: Normal rate and regular rhythm.   Pulmonary/Chest: Effort normal and breath sounds normal. She has no wheezes. She has no rales.  Abdominal: Soft. Bowel sounds are normal. She exhibits distension. There is tenderness.  Ascites present. Abdomen soft. Diffusely tender. Umbilical hernia that is easily reducible. No bruising to abdominal wall.   Musculoskeletal: Normal range of motion.  Neurological: She is alert and oriented to person, place, and time.  Skin: Skin is warm  and dry. No rash noted.  Psychiatric: She has a normal mood and affect.    ED Course  Procedures (including critical care time) Labs Review Labs Reviewed  CBC WITH DIFFERENTIAL/PLATELET  COMPREHENSIVE METABOLIC PANEL  URINALYSIS, ROUTINE W REFLEX MICROSCOPIC (NOT AT [facility])  AMMONIA  PROTIME-INR    Imaging Review No results found. I have personally reviewed and evaluated these images and lab results as part of my medical decision-making.   EKG Interpretation None      MDM   Final diagnoses:  None    1. Abdominal pain 2. Autoimmune hepatitis  The patient arrives with complaint of abdominal pain after fall where she hit her abdomen on the furniture. No vomiting. No other injury. CT scan negative for solid organ injury. VSS. Patient does not appear encephalopathic, have acute bleeding and work is negative for acute injury secondary to fall. Total bilirubin is noted to be significantly elevated compared  to recent hospitalization, = 21.0. Normal transaminases. Elevated INR. Ammonia 60 (improved from 148 last result).   Discussed the elevated total bili with on-call GI who adivsed that elevation as isolated concern is something appropriate for follow up with primary GI at [facility] (Dr. Piedad Climes). Patient is felt stable for discharge home.     Elpidio Anis, PA-C 02/15/15 9147  Tilden Fossa, MD 02/16/15 2026

## 2015-02-15 NOTE — ED Notes (Signed)
Patient transported to CT 

## 2015-02-15 NOTE — ED Notes (Addendum)
Pt states she fell about 3 hours and landed on her right side. Her left side abd hit the couch. About an hour after the fall, pt reports her abd started hurting. Also, has nausea and diarrhea.

## 2015-02-16 ENCOUNTER — Other Ambulatory Visit: Payer: Self-pay | Admitting: Internal Medicine

## 2015-02-16 DIAGNOSIS — R188 Other ascites: Secondary | ICD-10-CM

## 2015-02-17 ENCOUNTER — Emergency Department (HOSPITAL_COMMUNITY): Payer: BC Managed Care – PPO

## 2015-02-17 ENCOUNTER — Encounter (HOSPITAL_COMMUNITY): Payer: Self-pay | Admitting: Emergency Medicine

## 2015-02-17 ENCOUNTER — Ambulatory Visit (HOSPITAL_COMMUNITY): Admission: RE | Admit: 2015-02-17 | Payer: BC Managed Care – PPO | Source: Ambulatory Visit

## 2015-02-17 ENCOUNTER — Emergency Department (HOSPITAL_COMMUNITY)
Admission: EM | Admit: 2015-02-17 | Discharge: 2015-02-18 | Disposition: A | Discharge status: Home / Self Care | Payer: BC Managed Care – PPO | Attending: Emergency Medicine | Admitting: Emergency Medicine

## 2015-02-17 DIAGNOSIS — Z3202 Encounter for pregnancy test, result negative: Secondary | ICD-10-CM | POA: Diagnosis not present

## 2015-02-17 DIAGNOSIS — Z8742 Personal history of other diseases of the female genital tract: Secondary | ICD-10-CM | POA: Diagnosis not present

## 2015-02-17 DIAGNOSIS — Z79899 Other long term (current) drug therapy: Secondary | ICD-10-CM | POA: Diagnosis not present

## 2015-02-17 DIAGNOSIS — K7290 Hepatic failure, unspecified without coma: Secondary | ICD-10-CM

## 2015-02-17 DIAGNOSIS — Z792 Long term (current) use of antibiotics: Secondary | ICD-10-CM | POA: Insufficient documentation

## 2015-02-17 DIAGNOSIS — Z8659 Personal history of other mental and behavioral disorders: Secondary | ICD-10-CM | POA: Insufficient documentation

## 2015-02-17 DIAGNOSIS — R109 Unspecified abdominal pain: Secondary | ICD-10-CM | POA: Diagnosis present

## 2015-02-17 DIAGNOSIS — Z8619 Personal history of other infectious and parasitic diseases: Secondary | ICD-10-CM | POA: Diagnosis not present

## 2015-02-17 DIAGNOSIS — Z7952 Long term (current) use of systemic steroids: Secondary | ICD-10-CM | POA: Insufficient documentation

## 2015-02-17 DIAGNOSIS — Z862 Personal history of diseases of the blood and blood-forming organs and certain disorders involving the immune mechanism: Secondary | ICD-10-CM | POA: Insufficient documentation

## 2015-02-17 DIAGNOSIS — Z87891 Personal history of nicotine dependence: Secondary | ICD-10-CM | POA: Diagnosis not present

## 2015-02-17 DIAGNOSIS — Z8679 Personal history of other diseases of the circulatory system: Secondary | ICD-10-CM | POA: Insufficient documentation

## 2015-02-17 DIAGNOSIS — R1084 Generalized abdominal pain: Secondary | ICD-10-CM

## 2015-02-17 DIAGNOSIS — Z880 Allergy status to penicillin: Secondary | ICD-10-CM | POA: Insufficient documentation

## 2015-02-17 DIAGNOSIS — R188 Other ascites: Secondary | ICD-10-CM

## 2015-02-17 LAB — PROTIME-INR
INR: 2.78 — ABNORMAL HIGH (ref 0.00–1.49)
Prothrombin Time: 28.9 seconds — ABNORMAL HIGH (ref 11.6–15.2)

## 2015-02-17 LAB — BODY FLUID CELL COUNT WITH DIFFERENTIAL
Lymphs, Fluid: 6 %
Monocyte-Macrophage-Serous Fluid: 13 % — ABNORMAL LOW (ref 50–90)
Neutrophil Count, Fluid: 81 % — ABNORMAL HIGH (ref 0–25)
Total Nucleated Cell Count, Fluid: 164 cu mm (ref 0–1000)

## 2015-02-17 LAB — COMPREHENSIVE METABOLIC PANEL
ALT: 16 U/L (ref 14–54)
AST: 28 U/L (ref 15–41)
Albumin: 3.4 g/dL — ABNORMAL LOW (ref 3.5–5.0)
Alkaline Phosphatase: 181 U/L — ABNORMAL HIGH (ref 38–126)
Anion gap: 13 (ref 5–15)
BUN: 11 mg/dL (ref 6–20)
CO2: 25 mmol/L (ref 22–32)
Calcium: 8.7 mg/dL — ABNORMAL LOW (ref 8.9–10.3)
Chloride: 93 mmol/L — ABNORMAL LOW (ref 101–111)
Creatinine, Ser: 0.43 mg/dL — ABNORMAL LOW (ref 0.44–1.00)
GFR calc Af Amer: >60 mL/min (ref >60)
GFR calc non Af Amer: >60 mL/min (ref >60)
Glucose, Bld: 82 mg/dL (ref 65–99)
Potassium: 3.2 mmol/L — ABNORMAL LOW (ref 3.5–5.1)
Sodium: 131 mmol/L — ABNORMAL LOW (ref 135–145)
Total Bilirubin: 29.3 mg/dL (ref 0.3–1.2)
Total Protein: 6 g/dL — ABNORMAL LOW (ref 6.5–8.1)

## 2015-02-17 LAB — URINALYSIS, ROUTINE W REFLEX MICROSCOPIC
Glucose, UA: NEGATIVE mg/dL
Hgb urine dipstick: NEGATIVE
Ketones, ur: NEGATIVE mg/dL
Leukocytes, UA: NEGATIVE
Nitrite: NEGATIVE
Protein, ur: NEGATIVE mg/dL
Specific Gravity, Urine: 1.009 (ref 1.005–1.030)
pH: 6.5 (ref 5.0–8.0)

## 2015-02-17 LAB — CBC WITH DIFFERENTIAL/PLATELET
Basophils Absolute: 0 10*3/uL (ref 0.0–0.1)
Basophils Relative: 0 %
Eosinophils Absolute: 0 10*3/uL (ref 0.0–0.7)
Eosinophils Relative: 0 %
HCT: 25.3 % — ABNORMAL LOW (ref 36.0–46.0)
Hemoglobin: 8.6 g/dL — ABNORMAL LOW (ref 12.0–15.0)
Lymphocytes Relative: 12 %
Lymphs Abs: 0.7 10*3/uL (ref 0.7–4.0)
MCH: 34.7 pg — ABNORMAL HIGH (ref 26.0–34.0)
MCHC: 34 g/dL (ref 30.0–36.0)
MCV: 102 fL — ABNORMAL HIGH (ref 78.0–100.0)
Monocytes Absolute: 0.4 10*3/uL (ref 0.1–1.0)
Monocytes Relative: 8 %
Neutro Abs: 4.5 10*3/uL (ref 1.7–7.7)
Neutrophils Relative %: 80 %
Platelets: 64 10*3/uL — ABNORMAL LOW (ref 150–400)
RBC: 2.48 MIL/uL — ABNORMAL LOW (ref 3.87–5.11)
RDW: 16.9 % — ABNORMAL HIGH (ref 11.5–15.5)
WBC: 5.6 10*3/uL (ref 4.0–10.5)

## 2015-02-17 LAB — CBG MONITORING, ED: Glucose-Capillary: 74 mg/dL (ref 65–99)

## 2015-02-17 LAB — POC URINE PREG, ED: Preg Test, Ur: NEGATIVE

## 2015-02-17 LAB — AMMONIA: Ammonia: 53 umol/L — ABNORMAL HIGH (ref 9–35)

## 2015-02-17 LAB — LIPASE, BLOOD: Lipase: 39 U/L (ref 11–51)

## 2015-02-17 MED ORDER — HYDROMORPHONE HCL 1 MG/ML IJ SOLN
1.0000 mg | Freq: Once | INTRAMUSCULAR | Status: AC
  Administered 2015-02-17: 1 mg via INTRAVENOUS
  Filled 2015-02-17: qty 1

## 2015-02-17 MED ORDER — SODIUM CHLORIDE 0.9 % IV BOLUS (SEPSIS)
1000.0000 mL | Freq: Once | INTRAVENOUS | Status: AC
  Administered 2015-02-17: 1000 mL via INTRAVENOUS

## 2015-02-17 MED ORDER — GI COCKTAIL ~~LOC~~
30.0000 mL | Freq: Once | ORAL | Status: AC
  Administered 2015-02-17: 30 mL via ORAL
  Filled 2015-02-17: qty 30

## 2015-02-17 MED ORDER — OXYCODONE HCL 5 MG PO TABS: 5.0000 mg | ORAL_TABLET | ORAL | Status: AC | PRN

## 2015-02-17 NOTE — ED Notes (Signed)
PA Jeff aware patient is in pain and nauseated.

## 2015-02-17 NOTE — ED Notes (Signed)
Per EMS-states abdominal pain that started 2 hours ago-was here 2 days ago for the same symptoms

## 2015-02-17 NOTE — ED Notes (Signed)
Bed: WA24 Expected date:  Expected time:  Means of arrival:  Comments: EMS 

## 2015-02-17 NOTE — ED Notes (Signed)
Patient transported to Ultrasound 

## 2015-02-17 NOTE — ED Provider Notes (Signed)
  Face-to-face evaluation   History: She presents for evaluation of abdominal pain. She has recurrent abdominal pain, with cirrhosis, requiring intermittent paracentesis. She was recently hospitalized, treated for SBP, and discharged 4 days ago. She reports that she is taking her usual medications.  Physical exam: Alert, anxious, uncomfortable. Abdomen, distended with fluid wave consistent with ascites.  Procedure: Diagnostic paracentesis, by me. RLQ in reverse trendelenburg position to pool ascitic fluid. Betadine cleansing of skin. 21-gauge needle on 10 cc syringe used to obtain 6 cc of straw-colored peritoneal fluid. Patient tolerated procedure well. Band-Aid applied.  Medical screening examination/treatment/procedure(s) were conducted as a shared visit with non-physician practitioner(s) and myself.  I personally evaluated the patient during the encounter  Mancel Bale, MD 02/18/15 1321

## 2015-02-17 NOTE — ED Notes (Signed)
Bed: WA24 Expected date:  Expected time:  Means of arrival:  Comments: No bed 

## 2015-02-17 NOTE — Discharge Instructions (Signed)
Ascites Ascites is a collection of excess fluid in the abdomen. Ascites can range from mild to severe. It can get worse without treatment. CAUSES Possible causes include:  Cirrhosis. This is the most common cause of ascites.  Infection or inflammation in the abdomen.  Cancer in the abdomen.  Heart failure.  Kidney disease.  Inflammation of the pancreas.  Clots in the veins of the liver. SIGNS AND SYMPTOMS Signs and symptoms may include:  A feeling of fullness in your abdomen. This is common.  An increase in the size of your abdomen or your waist.  Swelling in your legs.  Swelling of the scrotum in men.  Difficulty breathing.  Abdominal pain.  Sudden weight gain. If the condition is mild, you may not have symptoms. DIAGNOSIS To make a diagnosis, your health care provider will:  Ask about your medical history.  Perform a physical exam.  Order imaging tests, such as an ultrasound or CT scan of your abdomen. TREATMENT Treatment depends on the cause of the ascites. It may include:  Taking a pill to make you urinate. This is called a water pill (diuretic pill).  Strictly reducing your salt (sodium) intake. Salt can cause extra fluid to be kept in the body, and this makes ascites worse.  Having a procedure to remove fluid from your abdomen (paracentesis).  Having a procedure to transfer fluid from your abdomen into a vein.  Having a procedure that connects two of the major veins within your liver and relieves pressure on your liver (TIPS procedure). Ascites may go away or improve with treatment of the condition that caused it.  HOME CARE INSTRUCTIONS  Keep track of your weight. To do this, weigh yourself at the same time every day and record your weight.  Keep track of how much you drink and any changes in the amount you urinate.  Follow any instructions that your health care provider gives you about how much to drink.  Try not to eat salty (high-sodium)  foods.  Take medicines only as directed by your health care provider.  Keep all follow-up visits as directed by your health care provider. This is important.  Report any changes in your health to your health care provider, especially if you develop new symptoms or your symptoms get worse. SEEK MEDICAL CARE IF:  Your gain more than 3 pounds in 3 days.  Your abdominal size or your waist size increases.  You have new swelling in your legs.  The swelling in your legs gets worse. SEEK IMMEDIATE MEDICAL CARE IF:  You develop a fever.  You develop confusion.  You develop new or worsening difficulty breathing.  You develop new or worsening abdominal pain.  You develop new or worsening swelling in the scrotum (in men).   This information is not intended to replace advice given to you by your health care provider. Make sure you discuss any questions you have with your health care provider.   Document Released: 01/14/2005 Document Revised: 02/04/2014 Document Reviewed: 08/13/2013 Elsevier Interactive Patient Education 2016 [employer]   Please contact your GI specialist tomorrow and schedule follow-up evaluation. Please inform him that you were seen in the emergency room and all relevant information obtained from the visit. Please schedule your paracentesis tomorrow.

## 2015-02-17 NOTE — ED Provider Notes (Signed)
CSN: [id number]     Arrival date & time 02/17/15  1636 History   First MD Initiated Contact with Patient 02/17/15 1650     Chief Complaint  Patient presents with  . Abdominal Pain   HPI   25 YOF with a history of cirrhosis, hepatic encephalopathy, coagulopathy, splenomegaly, ascites, esophageal varices, autoimmune hepatitis presents today with abdominal pain. Patient was most recently seen here in the ED on 02/15/2015, 2 days ago for similar complaints. She was found to have a bilirubin of 21, with normal transaminases, elevated INR.  Patient received a CT scan there is negative for solid organ injury. Due to elevated bilirubin on call GI was consult to do advise that in the setting of isolated elevation this would be appropriate for follow-up with primary GI [facility].   Patient reports symptoms started today.  Patient reports that she took lidocaine drink which somewhat improved symptoms, with back to bed and had a return of symptoms upon awakening. She denies any fever, chills, reports some nausea but denies any vomiting. Patient reports this is similar to previous episodes of abdominal pain. Patient reports that she received paracentesis yesterday.   Review or charts shows patient has been recently admitted to [facility] for sepsis, discharged and followed up at [facility] was admitted again for abdominal pain and discharged again. Notes report patient has been noncompliant with medications, and appears that she is not a candidate for liver transplant due to noncompliance.    Past Medical History  Diagnosis Date  . Anemia 2012  . Esophageal varices (HCC) 2015    s/p multiple EGDs and bandings dating to 2015.   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 GIB (gastrointestinal bleeding)   . Cirrhosis (HCC) 2003  . Irregular periods 08/04/2014  . Vaginal Pap smear, abnormal   . Hepatic encephalopathy (HCC)   . Coagulopathy (HCC)   . STD  (female)     mutiple. risky sexual behavior.   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 Ascites 2003  . Psychosis    Past Surgical History  Procedure Laterality Date  . Gastric banding port revision    . Tonsillectomy    . Esophagogastroduodenoscopy  07/22/2011    Procedure: ESOPHAGOGASTRODUODENOSCOPY (EGD);  Surgeon: Graylin Shiver, MD;  Location: [facility] ENDOSCOPY;  Service: Endoscopy;  Laterality: N/A;  . Esophagogastroduodenoscopy N/A 09/11/2014    Procedure: ESOPHAGOGASTRODUODENOSCOPY (EGD);  Surgeon: Charlott Rakes, MD;  Location: [facility] ENDOSCOPY;  Service: Endoscopy;  Laterality: N/A;   Family History  Problem Relation Age of Onset  . Adopted: Yes   Social History  Substance Use Topics  . Smoking status: Former Smoker -- 0.50 packs/day for 8 years    Types: Cigarettes  . Smokeless tobacco: Never Used  . Alcohol Use: No   OB History    Gravida Para Term Preterm AB TAB SAB Ectopic Multiple Living       Review of Systems  All other systems reviewed and are negative.   Allergies  Tylenol and Amoxicillin  Home Medications   Prior to Admission medications   Medication Sig Start Date End Date Taking? Authorizing Provider  carvedilol (COREG) 3.125 MG tablet Take 3.125 mg by mouth 2 (two) times daily with a meal.    Yes Historical Provider, MD  furosemide (LASIX) 20 MG tablet Take 2 tablets (40 mg total) by mouth daily. Patient  taking differently: Take 20 mg by mouth 2 (two) times daily.  10/12/14  Yes Shanker Levora Dredge, MD  lactulose (CHRONULAC) 10 GM/15ML solution Take 30 mLs (20 g total) by mouth 2 (two) times daily. Patient taking differently: Take 20 g by mouth 3 (three) times daily.  09/13/14  Yes Vilinda Blanks Minor, NP  lidocaine (XYLOCAINE) 2 % solution Use as directed 200 mg in the mouth or throat 3 (three) times daily as needed. GI/esophageal upset. 02/15/15  Yes Historical Provider, MD  metoCLOPramide (REGLAN) 5 MG tablet Take 5 mg by mouth 2 (two)  times daily.    Yes Historical Provider, MD  omeprazole (PRILOSEC) 40 MG capsule Take 40 mg by mouth daily.   Yes Historical Provider, MD  predniSONE (DELTASONE) 5 MG tablet Take 5 mg by mouth daily with breakfast.   Yes Historical Provider, MD  spironolactone (ALDACTONE) 50 MG tablet Take 50 mg by mouth 3 (three) times daily.    Yes Historical Provider, MD  sulfamethoxazole-trimethoprim (BACTRIM DS,SEPTRA DS) 800-160 MG tablet Take 1 tablet by mouth daily. 02/08/15  Yes Lonia Blood, MD  ursodiol (ACTIGALL) 300 MG capsule Take 300 mg by mouth 2 (two) times daily.    Yes Historical Provider, MD  ondansetron (ZOFRAN) 4 MG tablet Take 1 tablet (4 mg total) by mouth every 8 (eight) hours as needed for nausea or vomiting. Patient not taking: Reported on 02/03/2015 01/22/15   Courteney Lyn Mackuen, MD  oxyCODONE (ROXICODONE) 5 MG immediate release tablet Take 1 tablet (5 mg total) by mouth every 4 (four) hours as needed for severe pain. 02/17/15   Nakul Avino, PA-C   BP 107/78 mmHg  Pulse 103  Temp(Src) 98.4 F (36.9 C) (Oral)  Resp 20  SpO2 99%   Physical Exam  Constitutional: She is oriented to person, place, and time. She appears well-developed and well-nourished.  HENT:  Head: Normocephalic and atraumatic.  Eyes: Conjunctivae are normal. Pupils are equal, round, and reactive to light. Right eye exhibits no discharge. Left eye exhibits no discharge. No scleral icterus.  Neck: Normal range of motion. No JVD present. No tracheal deviation present.  Cardiovascular: Regular rhythm, normal heart sounds and intact distal pulses.  Exam reveals no gallop and no friction rub.   No murmur heard. Pulmonary/Chest: Effort normal and breath sounds normal. No stridor. No respiratory distress. She has no wheezes. She has no rales. She exhibits no tenderness.  Abdominal: Bowel sounds are normal. She exhibits distension. She exhibits no mass. There is tenderness. There is no rebound and no guarding.   Musculoskeletal: Normal range of motion. She exhibits no edema or tenderness.  Neurological: She is alert and oriented to person, place, and time. Coordination normal.  Skin: Skin is warm and dry. No rash noted. No erythema. No pallor.  Psychiatric: She has a normal mood and affect. Her behavior is normal. Judgment and thought content normal.  Nursing note and vitals reviewed.   ED Course  Procedures (including critical care time) Labs Review Labs Reviewed  PROTIME-INR - Abnormal; Notable for the following:    Prothrombin Time 28.9 (*)    INR 2.78 (*)    All other components within normal limits  COMPREHENSIVE METABOLIC PANEL - Abnormal; Notable for the following:    Sodium 131 (*)    Potassium 3.2 (*)    Chloride 93 (*)    Creatinine, Ser 0.43 (*)    Calcium 8.7 (*)    Total Protein 6.0 (*)    Albumin  3.4 (*)    Alkaline Phosphatase 181 (*)    Total Bilirubin 29.3 (*)    All other components within normal limits  CBC WITH DIFFERENTIAL/PLATELET - Abnormal; Notable for the following:    RBC 2.48 (*)    Hemoglobin 8.6 (*)    HCT 25.3 (*)    MCV 102.0 (*)    MCH 34.7 (*)    RDW 16.9 (*)    Platelets 64 (*)    All other components within normal limits  AMMONIA - Abnormal; Notable for the following:    Ammonia 53 (*)    All other components within normal limits  URINALYSIS, ROUTINE W REFLEX MICROSCOPIC (NOT AT [facility]) - Abnormal; Notable for the following:    Color, Urine AMBER (*)    APPearance CLOUDY (*)    Bilirubin Urine LARGE (*)    All other components within normal limits  BODY FLUID CELL COUNT WITH DIFFERENTIAL - Abnormal; Notable for the following:    Color, Fluid ORANGE (*)    Appearance, Fluid CLOUDY (*)    Neutrophil Count, Fluid 81 (*)    Monocyte-Macrophage-Serous Fluid 13 (*)    All other components within normal limits  BODY FLUID CULTURE  LIPASE, BLOOD  PATHOLOGIST SMEAR REVIEW  POC URINE PREG, ED  CBG MONITORING, ED    Imaging Review US Abdomen  Complete  02/17/2015  CLINICAL DATA:  Acute onset of liver failure.  Initial encounter. EXAM: ULTRASOUND ABDOMEN COMPLETE DUPLEX ULTRASOUND OF LIVER TECHNIQUE: Color and duplex Doppler ultrasound was performed to evaluate the hepatic in-flow and out-flow vessels. COMPARISON:  CT of the abdomen and pelvis performed 02/15/2015 FINDINGS: Portal Vein Velocities Main:  28.4 cm/sec Right:  17.0 cm/sec Left:  15.6 cm/sec Hepatic Vein Velocities Right:  27.8 cm/sec Middle:  17.7 cm/sec Left:  15.8 cm/sec Hepatic Artery Velocity:  62.8 cm/sec Splenic Vein Velocity:  41.1 cm/sec Varices: Present. Ascites: Present. There is no evidence of splenic vein or portal vein thrombus. Normal hepatopetal flow is noted within the portal venous system, and normal hepatofugal flow is noted within the hepatic veins. Gallbladder: The gallbladder wall is diffusely thickened. This is nonspecific in the presence of ascites. No stones are identified. No pericholecystic fluid is seen. No ultrasonographic Murphy's sign is elicited. Common bile duct: Diameter: 0.2 cm, within normal limits in caliber. Liver: Diffusely nodular in contour, with mild underlying heterogeneity, reflecting hepatic cirrhosis. No definite dominant mass seen. IVC: No abnormality visualized. Pancreas: Visualized portion unremarkable. Spleen: Diffusely enlarged, measuring 15.4 cm in length. Right Kidney: Length: 10.3 cm. Echogenicity within normal limits. No mass or hydronephrosis visualized. Left Kidney: Length: 11.5 cm. Echogenicity within normal limits. No mass or hydronephrosis visualized. Abdominal aorta: No aneurysm visualized. The aortic bifurcation is not visualized. Other findings: Moderate volume ascites is noted within the abdomen and pelvis. IMPRESSION: 1. Portal and hepatic vein velocities, and hepatic artery and splenic vein velocities, as described above. Normal hepatopetal flow within the portal venous system, and normal hepatofugal flow within the hepatic  veins. 2. Varices noted. Moderate volume ascites within the abdomen and pelvis. 3. Findings of hepatic cirrhosis. 4. Splenomegaly. Electronically Signed   By: Roanna Raider M.D.   On: 02/17/2015 21:38   [place] Art/ven Flow Abd Pelv Doppler  02/17/2015  CLINICAL DATA:  Acute onset of liver failure.  Initial encounter. EXAM: ULTRASOUND ABDOMEN COMPLETE DUPLEX ULTRASOUND OF LIVER TECHNIQUE: Color and duplex Doppler ultrasound was performed to evaluate the hepatic in-flow and out-flow vessels. COMPARISON:  CT of  the abdomen and pelvis performed 02/15/2015 FINDINGS: Portal Vein Velocities Main:  28.4 cm/sec Right:  17.0 cm/sec Left:  15.6 cm/sec Hepatic Vein Velocities Right:  27.8 cm/sec Middle:  17.7 cm/sec Left:  15.8 cm/sec Hepatic Artery Velocity:  62.8 cm/sec Splenic Vein Velocity:  41.1 cm/sec Varices: Present. Ascites: Present. There is no evidence of splenic vein or portal vein thrombus. Normal hepatopetal flow is noted within the portal venous system, and normal hepatofugal flow is noted within the hepatic veins. Gallbladder: The gallbladder wall is diffusely thickened. This is nonspecific in the presence of ascites. No stones are identified. No pericholecystic fluid is seen. No ultrasonographic Murphy's sign is elicited. Common bile duct: Diameter: 0.2 cm, within normal limits in caliber. Liver: Diffusely nodular in contour, with mild underlying heterogeneity, reflecting hepatic cirrhosis. No definite dominant mass seen. IVC: No abnormality visualized. Pancreas: Visualized portion unremarkable. Spleen: Diffusely enlarged, measuring 15.4 cm in length. Right Kidney: Length: 10.3 cm. Echogenicity within normal limits. No mass or hydronephrosis visualized. Left Kidney: Length: 11.5 cm. Echogenicity within normal limits. No mass or hydronephrosis visualized. Abdominal aorta: No aneurysm visualized. The aortic bifurcation is not visualized. Other findings: Moderate volume ascites is noted within the abdomen and  pelvis. IMPRESSION: 1. Portal and hepatic vein velocities, and hepatic artery and splenic vein velocities, as described above. Normal hepatopetal flow within the portal venous system, and normal hepatofugal flow within the hepatic veins. 2. Varices noted. Moderate volume ascites within the abdomen and pelvis. 3. Findings of hepatic cirrhosis. 4. Splenomegaly. Electronically Signed   By: Roanna Raider M.D.   On: 02/17/2015 21:38   I have personally reviewed and evaluated these images and lab results as part of my medical decision-making.   EKG Interpretation None      MDM   Final diagnoses:  Liver failure (HCC)  Ascites  Generalized abdominal pain   Labs: POC urine, CBC, CMP, lipase, ammonia  Imaging: Ultrasound abdomen complete  Consults:  Therapeutics: Dilaudid  Discharge Meds:   Assessment/Plan: 25 year old female presents with abdominal pain. Patient has a history of chronic abdominal pain, autoimmune hepatitis. She's been seen here and given seen numerous times for this, most recently admitted to count with sepsis, secondary to test BP. Patient is afebrile, nontoxic appearing here today with acute onset of abdominal pain. Labs show significantly elevated bilirubin at 29.3 this is up from 21 on 02/15/2015 and up 13.9 and 02/06/2015. It is significant elevation, hepatology at [facility] was consult and who instructed [place] to perform an ultrasound with Dopplers, diagnostic paracentesis. State that if no significant findings on either of these results patient can be discharged home with outpatient follow-up.  Paracentesis performed by Mancel Bale M.D. here in the ED  Paracentesis results showed no signs of SBP  Ultrasound showed no immediate cause for concern  Patient has no signs of infectious etiology here in the ED, she showed some improvement in her pain after above pain medication. She will be discharged home with instructions to contact her GI specialist tomorrow, and inform him of  today's visit and all relevant data. She is instructed to request follow-up evaluation for further evaluation and management. She is given the number for interventional radiology and instructed to call them to schedule paracentesis. I feel no need to keep patient overnight for immediate paracentesis in the morning. Patient given strict return precautions.            Eyvonne Mechanic, PA-C 02/18/15 0041  Mancel Bale, MD 02/18/15 [phone]

## 2015-02-20 LAB — PATHOLOGIST SMEAR REVIEW

## 2015-02-21 ENCOUNTER — Ambulatory Visit (HOSPITAL_COMMUNITY): Admission: RE | Admit: 2015-02-21 | Payer: BC Managed Care – PPO | Source: Ambulatory Visit

## 2015-02-21 LAB — BODY FLUID CULTURE
Culture: NO GROWTH
Special Requests: NORMAL

## 2015-03-02 ENCOUNTER — Ambulatory Visit: Admission: RE | Admit: 2015-03-02 | Payer: BC Managed Care – PPO | Source: Ambulatory Visit

## 2015-03-05 ENCOUNTER — Inpatient Hospital Stay (HOSPITAL_COMMUNITY)
Admission: EM | Admit: 2015-03-05 | Discharge: 2015-03-29 | DRG: 871 | Disposition: E | Payer: BC Managed Care – PPO | Attending: Internal Medicine | Admitting: Internal Medicine

## 2015-03-05 ENCOUNTER — Encounter (HOSPITAL_COMMUNITY): Payer: Self-pay | Admitting: *Deleted

## 2015-03-05 ENCOUNTER — Emergency Department (HOSPITAL_COMMUNITY): Payer: BC Managed Care – PPO

## 2015-03-05 DIAGNOSIS — R188 Other ascites: Secondary | ICD-10-CM | POA: Diagnosis present

## 2015-03-05 DIAGNOSIS — E876 Hypokalemia: Secondary | ICD-10-CM | POA: Diagnosis not present

## 2015-03-05 DIAGNOSIS — R197 Diarrhea, unspecified: Secondary | ICD-10-CM | POA: Diagnosis not present

## 2015-03-05 DIAGNOSIS — K767 Hepatorenal syndrome: Secondary | ICD-10-CM | POA: Diagnosis present

## 2015-03-05 DIAGNOSIS — K652 Spontaneous bacterial peritonitis: Secondary | ICD-10-CM | POA: Diagnosis present

## 2015-03-05 DIAGNOSIS — K7460 Unspecified cirrhosis of liver: Secondary | ICD-10-CM | POA: Diagnosis present

## 2015-03-05 DIAGNOSIS — E870 Hyperosmolality and hypernatremia: Secondary | ICD-10-CM | POA: Diagnosis not present

## 2015-03-05 DIAGNOSIS — K830 Cholangitis: Secondary | ICD-10-CM | POA: Diagnosis present

## 2015-03-05 DIAGNOSIS — Z7952 Long term (current) use of systemic steroids: Secondary | ICD-10-CM | POA: Diagnosis not present

## 2015-03-05 DIAGNOSIS — Z66 Do not resuscitate: Secondary | ICD-10-CM | POA: Diagnosis not present

## 2015-03-05 DIAGNOSIS — K8301 Primary sclerosing cholangitis: Secondary | ICD-10-CM | POA: Diagnosis present

## 2015-03-05 DIAGNOSIS — K754 Autoimmune hepatitis: Secondary | ICD-10-CM | POA: Diagnosis present

## 2015-03-05 DIAGNOSIS — E43 Unspecified severe protein-calorie malnutrition: Secondary | ICD-10-CM | POA: Diagnosis present

## 2015-03-05 DIAGNOSIS — K7290 Hepatic failure, unspecified without coma: Secondary | ICD-10-CM | POA: Diagnosis present

## 2015-03-05 DIAGNOSIS — R1084 Generalized abdominal pain: Secondary | ICD-10-CM | POA: Diagnosis not present

## 2015-03-05 DIAGNOSIS — E872 Acidosis: Secondary | ICD-10-CM | POA: Diagnosis not present

## 2015-03-05 DIAGNOSIS — D684 Acquired coagulation factor deficiency: Secondary | ICD-10-CM | POA: Diagnosis present

## 2015-03-05 DIAGNOSIS — Z789 Other specified health status: Secondary | ICD-10-CM | POA: Diagnosis not present

## 2015-03-05 DIAGNOSIS — Z515 Encounter for palliative care: Secondary | ICD-10-CM | POA: Diagnosis not present

## 2015-03-05 DIAGNOSIS — A419 Sepsis, unspecified organism: Secondary | ICD-10-CM | POA: Diagnosis present

## 2015-03-05 DIAGNOSIS — I8510 Secondary esophageal varices without bleeding: Secondary | ICD-10-CM | POA: Diagnosis present

## 2015-03-05 DIAGNOSIS — N179 Acute kidney failure, unspecified: Secondary | ICD-10-CM | POA: Diagnosis present

## 2015-03-05 DIAGNOSIS — R739 Hyperglycemia, unspecified: Secondary | ICD-10-CM | POA: Diagnosis present

## 2015-03-05 DIAGNOSIS — K7682 Hepatic encephalopathy: Secondary | ICD-10-CM | POA: Diagnosis present

## 2015-03-05 DIAGNOSIS — Z87891 Personal history of nicotine dependence: Secondary | ICD-10-CM

## 2015-03-05 DIAGNOSIS — Z681 Body mass index (BMI) 19 or less, adult: Secondary | ICD-10-CM | POA: Diagnosis not present

## 2015-03-05 DIAGNOSIS — D689 Coagulation defect, unspecified: Secondary | ICD-10-CM | POA: Diagnosis present

## 2015-03-05 DIAGNOSIS — Z79899 Other long term (current) drug therapy: Secondary | ICD-10-CM | POA: Diagnosis not present

## 2015-03-05 DIAGNOSIS — D61818 Other pancytopenia: Secondary | ICD-10-CM | POA: Diagnosis present

## 2015-03-05 DIAGNOSIS — E871 Hypo-osmolality and hyponatremia: Secondary | ICD-10-CM | POA: Diagnosis present

## 2015-03-05 DIAGNOSIS — D649 Anemia, unspecified: Secondary | ICD-10-CM | POA: Diagnosis present

## 2015-03-05 DIAGNOSIS — R17 Unspecified jaundice: Secondary | ICD-10-CM | POA: Diagnosis present

## 2015-03-05 DIAGNOSIS — R509 Fever, unspecified: Secondary | ICD-10-CM

## 2015-03-05 DIAGNOSIS — B370 Candidal stomatitis: Secondary | ICD-10-CM | POA: Diagnosis present

## 2015-03-05 DIAGNOSIS — Z7189 Other specified counseling: Secondary | ICD-10-CM | POA: Diagnosis not present

## 2015-03-05 DIAGNOSIS — R109 Unspecified abdominal pain: Secondary | ICD-10-CM | POA: Diagnosis present

## 2015-03-05 HISTORY — DX: Esophageal varices with bleeding: I85.01

## 2015-03-05 HISTORY — DX: Gastro-esophageal reflux disease without esophagitis: K21.9

## 2015-03-05 HISTORY — DX: Personal history of other medical treatment: Z92.89

## 2015-03-05 HISTORY — DX: Amenorrhea, unspecified: N91.2

## 2015-03-05 HISTORY — DX: Cardiac murmur, unspecified: R01.1

## 2015-03-05 HISTORY — DX: Spontaneous bacterial peritonitis: K65.2

## 2015-03-05 LAB — COMPREHENSIVE METABOLIC PANEL
ALK PHOS: 72 U/L (ref 38–126)
ALT: 18 U/L (ref 14–54)
ANION GAP: 15 (ref 5–15)
AST: 48 U/L — ABNORMAL HIGH (ref 15–41)
Albumin: 3.3 g/dL — ABNORMAL LOW (ref 3.5–5.0)
BUN: 42 mg/dL — ABNORMAL HIGH (ref 6–20)
CALCIUM: 9 mg/dL (ref 8.9–10.3)
CO2: 22 mmol/L (ref 22–32)
Chloride: 82 mmol/L — ABNORMAL LOW (ref 101–111)
Creatinine, Ser: 3 mg/dL — ABNORMAL HIGH (ref 0.44–1.00)
GFR calc Af Amer: 24 mL/min — ABNORMAL LOW (ref >60)
GFR, EST NON AFRICAN AMERICAN: 21 mL/min — AB (ref >60)
Glucose, Bld: 118 mg/dL — ABNORMAL HIGH (ref 65–99)
POTASSIUM: 3.5 mmol/L (ref 3.5–5.1)
Sodium: 119 mmol/L — CL (ref 135–145)
TOTAL PROTEIN: 6.1 g/dL — AB (ref 6.5–8.1)

## 2015-03-05 LAB — URINALYSIS, ROUTINE W REFLEX MICROSCOPIC
GLUCOSE, UA: NEGATIVE mg/dL
HGB URINE DIPSTICK: NEGATIVE
Ketones, ur: 15 mg/dL — AB
NITRITE: NEGATIVE
PH: 6 (ref 5.0–8.0)
Protein, ur: NEGATIVE mg/dL
SPECIFIC GRAVITY, URINE: 1.012 (ref 1.005–1.030)

## 2015-03-05 LAB — CBC
HEMATOCRIT: 29.6 % — AB (ref 36.0–46.0)
HEMOGLOBIN: 10.6 g/dL — AB (ref 12.0–15.0)
MCH: 34.6 pg — ABNORMAL HIGH (ref 26.0–34.0)
MCHC: 35.8 g/dL (ref 30.0–36.0)
MCV: 96.7 fL (ref 78.0–100.0)
Platelets: 131 10*3/uL — ABNORMAL LOW (ref 150–400)
RBC: 3.06 MIL/uL — ABNORMAL LOW (ref 3.87–5.11)
RDW: 19.4 % — AB (ref 11.5–15.5)
WBC: 8.4 10*3/uL (ref 4.0–10.5)

## 2015-03-05 LAB — PROTIME-INR
INR: 3.94 — ABNORMAL HIGH (ref 0.00–1.49)
Prothrombin Time: 37.5 seconds — ABNORMAL HIGH (ref 11.6–15.2)

## 2015-03-05 LAB — I-STAT CG4 LACTIC ACID, ED: LACTIC ACID, VENOUS: 2.49 mmol/L — AB (ref 0.5–2.0)

## 2015-03-05 LAB — I-STAT BETA HCG BLOOD, ED (MC, WL, AP ONLY)

## 2015-03-05 LAB — URINE MICROSCOPIC-ADD ON

## 2015-03-05 LAB — RAPID STREP SCREEN (MED CTR MEBANE ONLY): Streptococcus, Group A Screen (Direct): NEGATIVE

## 2015-03-05 LAB — LIPASE, BLOOD: Lipase: 30 U/L (ref 11–51)

## 2015-03-05 MED ORDER — ONDANSETRON 4 MG PO TBDP
ORAL_TABLET | ORAL | Status: AC
  Filled 2015-03-05: qty 1

## 2015-03-05 MED ORDER — VANCOMYCIN HCL IN DEXTROSE 750-5 MG/150ML-% IV SOLN
750.0000 mg | Freq: Once | INTRAVENOUS | Status: AC
Start: 2015-03-05 — End: 2015-03-06
  Administered 2015-03-05: 750 mg via INTRAVENOUS
  Filled 2015-03-05: qty 150

## 2015-03-05 MED ORDER — SODIUM CHLORIDE 0.9 % IV SOLN
Freq: Once | INTRAVENOUS | Status: AC
  Administered 2015-03-05: 22:00:00 via INTRAVENOUS

## 2015-03-05 MED ORDER — FENTANYL CITRATE (PF) 100 MCG/2ML IJ SOLN
25.0000 ug | Freq: Once | INTRAMUSCULAR | Status: AC
  Administered 2015-03-05: 25 ug via INTRAVENOUS
  Filled 2015-03-05: qty 2

## 2015-03-05 MED ORDER — SODIUM CHLORIDE 0.9 % IV SOLN: Freq: Once | INTRAVENOUS | Status: DC

## 2015-03-05 MED ORDER — CEFEPIME HCL 2 G IJ SOLR
2.0000 g | INTRAMUSCULAR | Status: DC
  Administered 2015-03-06: 2 g via INTRAVENOUS
  Filled 2015-03-05: qty 2

## 2015-03-05 MED ORDER — LIDOCAINE HCL (PF) 1 % IJ SOLN
INTRAMUSCULAR | Status: AC
  Filled 2015-03-05: qty 10

## 2015-03-05 MED ORDER — HYDROMORPHONE HCL 1 MG/ML IJ SOLN
0.5000 mg | Freq: Once | INTRAMUSCULAR | Status: AC
  Administered 2015-03-05: 0.5 mg via INTRAVENOUS
  Filled 2015-03-05: qty 1

## 2015-03-05 MED ORDER — DEXTROSE 5 % IV SOLN
2.0000 g | Freq: Once | INTRAVENOUS | Status: AC
  Administered 2015-03-05: 2 g via INTRAVENOUS
  Filled 2015-03-05: qty 2

## 2015-03-05 MED ORDER — LIDOCAINE HCL (PF) 1 % IJ SOLN
30.0000 mL | Freq: Once | INTRAMUSCULAR | Status: AC
  Administered 2015-03-05: 10 mL via INTRADERMAL
  Filled 2015-03-05: qty 30

## 2015-03-05 MED ORDER — ONDANSETRON 4 MG PO TBDP
4.0000 mg | ORAL_TABLET | Freq: Once | ORAL | Status: AC | PRN
  Administered 2015-03-05: 4 mg via ORAL

## 2015-03-05 MED ORDER — VANCOMYCIN HCL 500 MG IV SOLR
500.0000 mg | INTRAVENOUS | Status: DC
  Filled 2015-03-05: qty 500

## 2015-03-05 NOTE — ED Notes (Signed)
Pt refusing to have temp taken rectally

## 2015-03-05 NOTE — ED Notes (Signed)
MD at bedside performing diagnostic paracentesis.

## 2015-03-05 NOTE — Progress Notes (Signed)
Pharmacy Antibiotic Note Melanie Conley is a 25 y.o. female admitted on 03/09/2015 with generalized weakness, hepatic encephalopathy, hyponatremia in setting autoimmune hepatitis and AKI. Pharmacy has been consulted for Cefepime and vancomycin dosing for potential sepsis/SBP.  Plan: 1. Cefepime 2 gram now then 2 gram Q 24 hours  2. Vancomycin 750 mg x 1 now then 500 mg q 24 hours 3. Monitor renal fxn closely as hopeful that will improve with IVFS and empirically adjust abx as needed  Temp (24hrs), Avg:100.2 F (37.9 C), Min:100.2 F (37.9 C), Max:100.2 F (37.9 C)   Recent Labs Lab 03/07/2015 2034  WBC 8.4  CREATININE 3.00*    CrCl cannot be calculated (Unknown ideal weight.).    Allergies  Allergen Reactions  . Tylenol [Acetaminophen]     Can not take because of Liver  . 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 (pt was in the hospital when this occurred - May 2016) Has patient had a PCN reaction causing immediate rash, facial/tongue/throat swelling, SOB or lightheadedness with hypotension: No Has patient had a PCN reaction causing severe rash involving mucus membranes or skin necrosis: No Has patient had a PCN reaction that required hospitalization Yes Has patient had a PCN reaction occurring within the last 10 years: Yes If all of the above answers are "NO", then moay proceed with Ceph    Antimicrobials this admission: 2/5 vancomycin  >> 2/5 Cefepime  >>   Dose adjustments this admission: n/a  Microbiology results: px  Thank you for allowing pharmacy to be a part of this patient's care.  Pollyann Samples, PharmD, BCPS 03/02/2015, 9:44 PM Pager: [phone]

## 2015-03-05 NOTE — ED Provider Notes (Signed)
CSN: [id number]     Arrival date & time 2015-03-25  2016 History   First MD Initiated Contact with Patient 03-25-15 2056     Chief Complaint  Patient presents with  . Weakness  . Headache  . Abdominal Pain     (Consider location/radiation/quality/duration/timing/severity/associated sxs/prior Treatment) HPI  31 y f w advance autoimmune hepatitis who was admitted last month for sbp.  Since being home she had been welll up until today when she developed sore throat, abdominal pain, headache, fever.  Abdominal pain is diffuse, severe, worse with palpation, better with rest.  She has had a couple of episodes of NBNB emesis.   Past Medical History  Diagnosis Date  . Anemia 2012  . Esophageal varices (HCC) 2015    s/p multiple EGDs and bandings dating to 2015.   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 GIB (gastrointestinal bleeding)   . Cirrhosis (HCC) 2003  . Irregular periods 08/04/2014  . Vaginal Pap smear, abnormal   . Hepatic encephalopathy (HCC)   . Coagulopathy (HCC)   . STD (female)     mutiple. risky sexual behavior.   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 Ascites 2003  . Psychosis    Past Surgical History  Procedure Laterality Date  . Gastric banding port revision    . Tonsillectomy    . Esophagogastroduodenoscopy  07/22/2011    Procedure: ESOPHAGOGASTRODUODENOSCOPY (EGD);  Surgeon: Graylin Shiver, MD;  Location: [facility] ENDOSCOPY;  Service: Endoscopy;  Laterality: N/A;  . Esophagogastroduodenoscopy N/A 09/11/2014    Procedure: ESOPHAGOGASTRODUODENOSCOPY (EGD);  Surgeon: Charlott Rakes, MD;  Location: [facility] ENDOSCOPY;  Service: Endoscopy;  Laterality: N/A;   Family History  Problem Relation Age of Onset  . Adopted: Yes   Social History  Substance Use Topics  . Smoking status: Former Smoker -- 0.50 packs/day for 8 years    Types: Cigarettes  . Smokeless tobacco: Never Used  . Alcohol Use: No   OB History    Gravida  Para Term Preterm AB TAB SAB Ectopic Multiple Living       Review of Systems  Constitutional: Negative for fever and chills.  HENT: Positive for sore throat. Negative for nosebleeds.   Eyes: Negative for visual disturbance.  Respiratory: Negative for cough and shortness of breath.   Cardiovascular: Negative for chest pain.  Gastrointestinal: Positive for nausea, vomiting and abdominal pain. Negative for diarrhea and constipation.  Genitourinary: Negative for dysuria.  Skin: Negative for rash.  Neurological: Positive for headaches. Negative for weakness.  All other systems reviewed and are negative.     Allergies  Tylenol and Amoxicillin  Home Medications   Prior to Admission medications   Medication Sig Start Date End Date Taking? Authorizing Provider  azaTHIOprine (IMURAN) 50 MG tablet Take 25 mg by mouth 2 (two) times daily.   Yes Historical Provider, MD  HYDROmorphone (DILAUDID) 2 MG tablet Take 2 mg by mouth every 4 (four) hours as needed. For pain 02/26/15 03/12/15 Yes Historical Provider, MD  lactulose (CHRONULAC) 10 GM/15ML solution Take 30 mLs (20 g total) by mouth 2 (two) times daily. Patient taking differently: Take 20 g by mouth 3 (three) times daily.  09/13/14  Yes Vilinda Blanks Minor, NP  lidocaine (XYLOCAINE) 2 % solution Use as directed 200 mg in the mouth or throat 3 (three) times daily as needed. GI/esophageal upset. 02/15/15  Yes  Historical Provider, MD  Melatonin 3 MG TABS Take 3 mg by mouth. 02/26/15  Yes Historical Provider, MD  metoCLOPramide (REGLAN) 5 MG tablet Take 5 mg by mouth 2 (two) times daily.    Yes Historical Provider, MD  omeprazole (PRILOSEC) 40 MG capsule Take 40 mg by mouth daily.   Yes Historical Provider, MD  ondansetron (ZOFRAN) 4 MG tablet Take 1 tablet (4 mg total) by mouth every 8 (eight) hours as needed for nausea or vomiting. 01/22/15  Yes Courteney Lyn Mackuen, MD  predniSONE (DELTASONE) 5 MG tablet Take 5 mg by mouth daily  with breakfast.   Yes Historical Provider, MD  rifaximin (XIFAXAN) 550 MG TABS tablet Take 550 mg by mouth 2 (two) times daily.   Yes Historical Provider, MD  sulfamethoxazole-trimethoprim (BACTRIM DS,SEPTRA DS) 800-160 MG tablet Take 1 tablet by mouth daily. 02/08/15  Yes Lonia Blood, MD  ursodiol (ACTIGALL) 300 MG capsule Take 300 mg by mouth 2 (two) times daily.    Yes Historical Provider, MD  furosemide (LASIX) 20 MG tablet Take 2 tablets (40 mg total) by mouth daily. Patient taking differently: Take 20 mg by mouth 2 (two) times daily.  10/12/14   Shanker Levora Dredge, MD  oxyCODONE (ROXICODONE) 5 MG immediate release tablet Take 1 tablet (5 mg total) by mouth every 4 (four) hours as needed for severe pain. 02/17/15   Jeffrey Hedges, PA-C   BP 100/51 mmHg  Pulse 96  Temp(Src) 100.2 F (37.9 C) (Oral)  Resp 20  Wt 41.277 kg  SpO2 98% Physical Exam  Constitutional: She is oriented to person, place, and time. No distress.  HENT:  Head: Normocephalic and atraumatic.  Eyes: EOM are normal. Pupils are equal, round, and reactive to light.  Neck: Normal range of motion. Neck supple.  Cardiovascular: Normal rate and intact distal pulses.   Pulmonary/Chest: No respiratory distress.  Abdominal: Soft. She exhibits distension. There is tenderness. There is rebound.  Musculoskeletal: Normal range of motion.  Neurological: She is alert and oriented to person, place, and time.  Skin: No rash noted. She is not diaphoretic.  Psychiatric: She has a normal mood and affect.    ED Course  .Paracentesis Date/Time: 03/06/2015 2:04 AM Performed by: Silas Flood Authorized by: Silas Flood Consent: Verbal consent obtained. Written consent obtained. Risks and benefits: risks, benefits and alternatives were discussed Consent given by: patient and parent Required items: required blood products, implants, devices, and special equipment available Patient identity confirmed: verbally with patient Procedure  purpose: diagnostic Indications: abdominal discomfort secondary to ascites and suspected peritonitis Anesthesia: local infiltration Local anesthetic: lidocaine 1% without epinephrine Anesthetic total: 10 ml Patient sedated: no Preparation: Patient was prepped and draped in the usual sterile fashion. Needle gauge: 20 Ultrasound guidance: yes Puncture site: right lower quadrant Fluid removed: 60(ml) Fluid appearance: serous Patient tolerance: Patient tolerated the procedure well with no immediate complications   (including critical care time) Labs Review Labs Reviewed  COMPREHENSIVE METABOLIC PANEL - Abnormal; Notable for the following:    Sodium 119 (*)    Chloride 82 (*)    Glucose, Bld 118 (*)    BUN 42 (*)    Creatinine, Ser 3.00 (*)    Total Protein 6.1 (*)    Albumin 3.3 (*)    AST 48 (*)    Total Bilirubin >50.0 (*)    GFR calc non Af Amer 21 (*)    GFR calc Af Amer 24 (*)    All other components  within normal limits  CBC - Abnormal; Notable for the following:    RBC 3.06 (*)    Hemoglobin 10.6 (*)    HCT 29.6 (*)    MCH 34.6 (*)    RDW 19.4 (*)    Platelets 131 (*)    All other components within normal limits  URINALYSIS, ROUTINE W REFLEX MICROSCOPIC (NOT AT [facility]) - Abnormal; Notable for the following:    Color, Urine BROWN (*)    APPearance CLOUDY (*)    Bilirubin Urine LARGE (*)    Ketones, ur 15 (*)    Leukocytes, UA TRACE (*)    All other components within normal limits  PROTIME-INR - Abnormal; Notable for the following:    Prothrombin Time 37.5 (*)    INR 3.94 (*)    All other components within normal limits  URINE MICROSCOPIC-ADD ON - Abnormal; Notable for the following:    Squamous Epithelial / LPF 0-5 (*)    Bacteria, UA FEW (*)    All other components within normal limits  I-STAT CG4 LACTIC ACID, ED - Abnormal; Notable for the following:    Lactic Acid, Venous 2.49 (*)    All other components within normal limits  RAPID STREP SCREEN (NOT AT [facility])   CULTURE, BLOOD (ROUTINE X 2)  CULTURE, BLOOD (ROUTINE X 2)  URINE CULTURE  CULTURE, GROUP A STREP ([facility])  LIPASE, BLOOD  INFLUENZA PANEL BY PCR (TYPE A & B, H1N1)  LACTATE DEHYDROGENASE, BODY FLUID  GLUCOSE, PERITONEAL FLUID  LACTIC ACID, PLASMA  LACTIC ACID, PLASMA  PROCALCITONIN  COMPREHENSIVE METABOLIC PANEL  CBC WITH DIFFERENTIAL/PLATELET  BRAIN NATRIURETIC PEPTIDE  I-STAT BETA HCG BLOOD, ED ([facility], [facility], [facility] ONLY)  I-STAT CG4 LACTIC ACID, ED    Imaging Review Dg Chest 2 View  03/21/2015  CLINICAL DATA:  Acute onset of fever, nausea and lethargy. Initial encounter. EXAM: CHEST  2 VIEW COMPARISON:  Chest radiograph performed 02/03/2015 FINDINGS: The lungs are hypoexpanded. There is no evidence of focal opacification, pleural effusion or pneumothorax. The heart is normal in size; the mediastinal contour is within normal limits. No acute osseous abnormalities are seen. IMPRESSION: Lungs hypoexpanded but grossly clear. Electronically Signed   By: Roanna Raider M.D.   On: 03/04/2015 23:42   I have personally reviewed and evaluated these images and lab results as part of my medical decision-making.   EKG Interpretation None      MDM   Final diagnoses:  Fever in adult  Sepsis, due to unspecified organism [facility])  Hyponatremia     24 y f w advance autoimmune hepatitis who was admitted last month for sbp.  Comes in today with abdominal pain, sore throat, headache, fever. Exam as above.  Ddx:  Sbp, strep, influenza, uti, pna  Will send strep, influenza, ua, cxr, will do paracentesis and send labs off of it.  Will obtain cbc/cmp/lactic acid. Will cover w/ vanc/cefepime for presumed sbp given she had that last month and she has diffuse ttp on exam.  Lactic mildly elevated, pt hyponatremic w/ na 119.  cxr unremarkable.  ua neg.  Influenza and strep sent.  Will gently give fluids in attempt not to correct na too quickly.  Will hold off on 30cc/kg of NS given hyponatremia and no  hypotension w/ only mild lactic acidosis.  Will admit to medicine for further eval.  Awaiting paracentesis lab results.     Silas Flood, MD 03/06/15 1610  Silas Flood, MD 03/06/15 [id number]  Blane Ohara, MD 03/09/15  [id number] 

## 2015-03-05 NOTE — ED Notes (Signed)
Pt c/o sore throat, general weakness, nausea, headache starting today. Pt has hepatic encephalopathy and cirrhosis. abd is very distended. States she normally gets it drained every week and it has been a week since it was drained.

## 2015-03-06 ENCOUNTER — Encounter (HOSPITAL_COMMUNITY): Payer: Self-pay | Admitting: Family Medicine

## 2015-03-06 DIAGNOSIS — Z515 Encounter for palliative care: Secondary | ICD-10-CM

## 2015-03-06 DIAGNOSIS — A419 Sepsis, unspecified organism: Principal | ICD-10-CM

## 2015-03-06 DIAGNOSIS — B370 Candidal stomatitis: Secondary | ICD-10-CM | POA: Diagnosis present

## 2015-03-06 DIAGNOSIS — D689 Coagulation defect, unspecified: Secondary | ICD-10-CM

## 2015-03-06 DIAGNOSIS — Z7189 Other specified counseling: Secondary | ICD-10-CM

## 2015-03-06 DIAGNOSIS — N179 Acute kidney failure, unspecified: Secondary | ICD-10-CM | POA: Diagnosis present

## 2015-03-06 LAB — GLUCOSE, CAPILLARY
GLUCOSE-CAPILLARY: 60 mg/dL — AB (ref 65–99)
Glucose-Capillary: 53 mg/dL — ABNORMAL LOW (ref 65–99)
Glucose-Capillary: 70 mg/dL (ref 65–99)

## 2015-03-06 LAB — COMPREHENSIVE METABOLIC PANEL
ALT: 18 U/L (ref 14–54)
ANION GAP: 14 (ref 5–15)
AST: 32 U/L (ref 15–41)
Albumin: 2.7 g/dL — ABNORMAL LOW (ref 3.5–5.0)
Alkaline Phosphatase: 54 U/L (ref 38–126)
BUN: 44 mg/dL — ABNORMAL HIGH (ref 6–20)
CHLORIDE: 85 mmol/L — AB (ref 101–111)
CO2: 22 mmol/L (ref 22–32)
CREATININE: 3.2 mg/dL — AB (ref 0.44–1.00)
Calcium: 8.4 mg/dL — ABNORMAL LOW (ref 8.9–10.3)
GFR calc non Af Amer: 19 mL/min — ABNORMAL LOW (ref >60)
GFR, EST AFRICAN AMERICAN: 22 mL/min — AB (ref >60)
Glucose, Bld: 78 mg/dL (ref 65–99)
Potassium: 3.5 mmol/L (ref 3.5–5.1)
SODIUM: 121 mmol/L — AB (ref 135–145)
Total Bilirubin: >50 mg/dL (ref 0.3–1.2)
Total Protein: 5.6 g/dL — ABNORMAL LOW (ref 6.5–8.1)

## 2015-03-06 LAB — BODY FLUID CELL COUNT WITH DIFFERENTIAL
LYMPHS FL: 83 %
Monocyte-Macrophage-Serous Fluid: 10 % — ABNORMAL LOW (ref 50–90)
NEUTROPHIL FLUID: 7 % (ref 0–25)
Total Nucleated Cell Count, Fluid: 12 cu mm (ref 0–1000)

## 2015-03-06 LAB — LACTATE DEHYDROGENASE, PLEURAL OR PERITONEAL FLUID: LD, Fluid: 18 U/L (ref 3–23)

## 2015-03-06 LAB — CBC WITH DIFFERENTIAL/PLATELET
Basophils Absolute: 0 10*3/uL (ref 0.0–0.1)
Basophils Relative: 0 %
Eosinophils Absolute: 0 10*3/uL (ref 0.0–0.7)
Eosinophils Relative: 0 %
HEMATOCRIT: 24.8 % — AB (ref 36.0–46.0)
HEMOGLOBIN: 8.9 g/dL — AB (ref 12.0–15.0)
LYMPHS ABS: 0.3 10*3/uL — AB (ref 0.7–4.0)
LYMPHS PCT: 5 %
MCH: 34.4 pg — AB (ref 26.0–34.0)
MCHC: 35.9 g/dL (ref 30.0–36.0)
MCV: 95.8 fL (ref 78.0–100.0)
MONOS PCT: 21 %
Monocytes Absolute: 1.5 10*3/uL — ABNORMAL HIGH (ref 0.1–1.0)
NEUTROS ABS: 5 10*3/uL (ref 1.7–7.7)
NEUTROS PCT: 74 %
Platelets: 109 10*3/uL — ABNORMAL LOW (ref 150–400)
RBC: 2.59 MIL/uL — ABNORMAL LOW (ref 3.87–5.11)
RDW: 19.1 % — ABNORMAL HIGH (ref 11.5–15.5)
WBC: 6.8 10*3/uL (ref 4.0–10.5)

## 2015-03-06 LAB — I-STAT CG4 LACTIC ACID, ED: LACTIC ACID, VENOUS: 1.22 mmol/L (ref 0.5–2.0)

## 2015-03-06 LAB — MRSA PCR SCREENING: MRSA BY PCR: NEGATIVE

## 2015-03-06 LAB — PROTEIN, BODY FLUID: Total protein, fluid: <3 g/dL

## 2015-03-06 LAB — INFLUENZA PANEL BY PCR (TYPE A & B)
H1N1 flu by pcr: NOT DETECTED
Influenza A By PCR: NEGATIVE
Influenza B By PCR: NEGATIVE

## 2015-03-06 LAB — PROCALCITONIN: PROCALCITONIN: 2.4 ng/mL

## 2015-03-06 LAB — ALBUMIN, FLUID (OTHER): Albumin, Fluid: <1 g/dL

## 2015-03-06 LAB — GLUCOSE, PERITONEAL FLUID: Glucose, Peritoneal Fluid: 118 mg/dL

## 2015-03-06 LAB — LACTIC ACID, PLASMA: Lactic Acid, Venous: 1 mmol/L (ref 0.5–2.0)

## 2015-03-06 LAB — BRAIN NATRIURETIC PEPTIDE: B Natriuretic Peptide: 17.4 pg/mL (ref 0.0–100.0)

## 2015-03-06 MED ORDER — SODIUM CHLORIDE 0.9 % IV SOLN
50.0000 ug/h | INTRAVENOUS | Status: DC
  Administered 2015-03-06 – 2015-03-13 (×15): 50 ug/h via INTRAVENOUS
  Filled 2015-03-06 (×37): qty 1

## 2015-03-06 MED ORDER — HEPARIN SODIUM (PORCINE) 5000 UNIT/ML IJ SOLN
5000.0000 [IU] | Freq: Three times a day (TID) | INTRAMUSCULAR | Status: DC
  Administered 2015-03-06: 5000 [IU] via SUBCUTANEOUS
  Filled 2015-03-06: qty 1

## 2015-03-06 MED ORDER — MELATONIN 3 MG PO TABS
3.0000 mg | ORAL_TABLET | Freq: Every evening | ORAL | Status: DC | PRN
  Administered 2015-03-06: 3 mg via ORAL
  Filled 2015-03-06: qty 1

## 2015-03-06 MED ORDER — ALBUMIN HUMAN 25 % IV SOLN
12.5000 g | Freq: Four times a day (QID) | INTRAVENOUS | Status: DC
  Administered 2015-03-06 – 2015-03-13 (×32): 12.5 g via INTRAVENOUS
  Filled 2015-03-06 (×37): qty 50

## 2015-03-06 MED ORDER — LACTULOSE 10 GM/15ML PO SOLN
20.0000 g | Freq: Three times a day (TID) | ORAL | Status: DC
  Administered 2015-03-06 – 2015-03-07 (×5): 20 g via ORAL
  Filled 2015-03-06 (×14): qty 30

## 2015-03-06 MED ORDER — VANCOMYCIN HCL 500 MG IV SOLR
500.0000 mg | INTRAVENOUS | Status: DC
  Administered 2015-03-07 – 2015-03-09 (×2): 500 mg via INTRAVENOUS
  Filled 2015-03-06 (×2): qty 500

## 2015-03-06 MED ORDER — URSODIOL 300 MG PO CAPS
300.0000 mg | ORAL_CAPSULE | Freq: Two times a day (BID) | ORAL | Status: DC
  Administered 2015-03-06 – 2015-03-11 (×10): 300 mg via ORAL
  Filled 2015-03-06 (×21): qty 1

## 2015-03-06 MED ORDER — HYDROMORPHONE HCL 1 MG/ML IJ SOLN
0.5000 mg | INTRAMUSCULAR | Status: DC | PRN
  Administered 2015-03-06 – 2015-03-08 (×6): 0.5 mg via INTRAVENOUS
  Filled 2015-03-06 (×8): qty 1

## 2015-03-06 MED ORDER — METOCLOPRAMIDE HCL 5 MG PO TABS
5.0000 mg | ORAL_TABLET | Freq: Two times a day (BID) | ORAL | Status: DC
  Administered 2015-03-06 – 2015-03-07 (×4): 5 mg via ORAL
  Filled 2015-03-06 (×7): qty 1

## 2015-03-06 MED ORDER — NYSTATIN 100000 UNIT/ML MT SUSP
5.0000 mL | Freq: Four times a day (QID) | OROMUCOSAL | Status: DC
  Administered 2015-03-06 – 2015-03-12 (×21): 500000 [IU] via OROMUCOSAL
  Filled 2015-03-06 (×32): qty 5

## 2015-03-06 MED ORDER — ENSURE ENLIVE PO LIQD: 237.0000 mL | Freq: Two times a day (BID) | ORAL | Status: DC

## 2015-03-06 MED ORDER — SODIUM CHLORIDE 0.9% FLUSH
3.0000 mL | Freq: Two times a day (BID) | INTRAVENOUS | Status: DC
  Administered 2015-03-06 – 2015-03-15 (×13): 3 mL via INTRAVENOUS

## 2015-03-06 MED ORDER — MIDODRINE HCL 5 MG PO TABS
7.5000 mg | ORAL_TABLET | Freq: Three times a day (TID) | ORAL | Status: DC
Start: 2015-03-06 — End: 2015-03-13
  Administered 2015-03-06 – 2015-03-12 (×12): 7.5 mg via ORAL
  Filled 2015-03-06: qty 1
  Filled 2015-03-06: qty 2
  Filled 2015-03-06: qty 1
  Filled 2015-03-06: qty 2
  Filled 2015-03-06: qty 1
  Filled 2015-03-06: qty 2
  Filled 2015-03-06 (×5): qty 1
  Filled 2015-03-06 (×2): qty 2
  Filled 2015-03-06 (×3): qty 1
  Filled 2015-03-06 (×2): qty 2
  Filled 2015-03-06 (×3): qty 1
  Filled 2015-03-06: qty 2
  Filled 2015-03-06: qty 1
  Filled 2015-03-06: qty 2

## 2015-03-06 MED ORDER — PANTOPRAZOLE SODIUM 40 MG IV SOLR
40.0000 mg | Freq: Two times a day (BID) | INTRAVENOUS | Status: DC
  Administered 2015-03-06 – 2015-03-13 (×14): 40 mg via INTRAVENOUS
  Filled 2015-03-06 (×15): qty 40

## 2015-03-06 MED ORDER — PREDNISONE 5 MG PO TABS
5.0000 mg | ORAL_TABLET | Freq: Every day | ORAL | Status: DC
  Administered 2015-03-06 – 2015-03-07 (×2): 5 mg via ORAL
  Filled 2015-03-06 (×7): qty 1

## 2015-03-06 MED ORDER — SODIUM CHLORIDE 0.9% FLUSH
3.0000 mL | INTRAVENOUS | Status: DC | PRN
  Administered 2015-03-06: 3 mL via INTRAVENOUS

## 2015-03-06 MED ORDER — OCTREOTIDE LOAD VIA INFUSION
50.0000 ug | Freq: Once | INTRAVENOUS | Status: AC
Start: 2015-03-06 — End: 2015-03-06
  Administered 2015-03-06: 50 ug via INTRAVENOUS
  Filled 2015-03-06: qty 25

## 2015-03-06 MED ORDER — ONDANSETRON HCL 4 MG/2ML IJ SOLN
4.0000 mg | Freq: Four times a day (QID) | INTRAMUSCULAR | Status: DC | PRN
  Administered 2015-03-06 (×2): 4 mg via INTRAVENOUS
  Filled 2015-03-06 (×2): qty 2

## 2015-03-06 MED ORDER — ENSURE ENLIVE PO LIQD: 237.0000 mL | Freq: Two times a day (BID) | ORAL | Status: AC

## 2015-03-06 MED ORDER — SODIUM CHLORIDE 0.9 % IV SOLN
250.0000 mL | INTRAVENOUS | Status: DC | PRN
Start: 2015-03-06 — End: 2015-03-08

## 2015-03-06 MED ORDER — AZATHIOPRINE 50 MG PO TABS
25.0000 mg | ORAL_TABLET | Freq: Two times a day (BID) | ORAL | Status: DC
  Administered 2015-03-06 – 2015-03-12 (×12): 25 mg via ORAL
  Filled 2015-03-06 (×21): qty 1

## 2015-03-06 MED ORDER — SODIUM CHLORIDE 0.9% FLUSH
3.0000 mL | Freq: Two times a day (BID) | INTRAVENOUS | Status: DC
  Administered 2015-03-06 – 2015-03-07 (×4): 3 mL via INTRAVENOUS

## 2015-03-06 MED ORDER — DEXTROSE-NACL 5-0.9 % IV SOLN
INTRAVENOUS | Status: DC
  Administered 2015-03-06 – 2015-03-12 (×4): via INTRAVENOUS

## 2015-03-06 MED ORDER — DEXTROSE 5 % IV SOLN: 2.0000 g | INTRAVENOUS | Status: AC

## 2015-03-06 MED ORDER — RIFAXIMIN 550 MG PO TABS
550.0000 mg | ORAL_TABLET | Freq: Two times a day (BID) | ORAL | Status: DC
  Administered 2015-03-06 – 2015-03-11 (×11): 550 mg via ORAL
  Filled 2015-03-06 (×20): qty 1

## 2015-03-06 MED ORDER — MELATONIN 3 MG PO TABS: 3.0000 mg | ORAL_TABLET | Freq: Every evening | ORAL | Status: DC | PRN

## 2015-03-06 MED ORDER — ONDANSETRON HCL 4 MG PO TABS: 4.0000 mg | ORAL_TABLET | Freq: Four times a day (QID) | ORAL | Status: DC | PRN

## 2015-03-06 NOTE — ED Notes (Signed)
Attempted to call report x 1  

## 2015-03-06 NOTE — Care Management Note (Signed)
Case Management Note  Patient Details  Name: Melanie Conley MRN: [id number] Date of Birth: 05-30-1990  Subjective/Objective:   Pt admitted with spontaneous bacterial peritonitis, suspiscion of heptorenal syndrome                 Action/Plan:  Pt is from home with mother. Pt requested to transfer to [facility] where she has been receiving care for above condition.  Attending working towards transfer, CM will continue to monitor for CM needs   Expected Discharge Date:                  Expected Discharge Plan:  Home/Self Care  In-House Referral:     Discharge planning Services  CM Consult  Post Acute Care Choice:    Choice offered to:     DME Arranged:    DME Agency:     HH Arranged:    HH Agency:     Status of Service:  In process, will continue to follow  Medicare Important Message Given:    Date Medicare IM Given:    Medicare IM give by:    Date Additional Medicare IM Given:    Additional Medicare Important Message give by:     If discussed at Long Length of Stay Meetings, dates discussed:    Additional Comments:  Cherylann Parr, RN 03/06/2015, 2:24 PM

## 2015-03-06 NOTE — Discharge Summary (Signed)
Physician Discharge Summary  Melanie Conley ZOX:[id number] DOB: Jun 14, 1990 DOA: 03/04/2015  PCP: Woodfin Ganja, MD  Admit date: 03/27/2015 Discharge date: 03/06/2015  Time spent: 35 minutes  TRANSFER TO [facility] WHEN BED AVAILABLE   Discharge Diagnoses:  Principal Problem:   SBP (spontaneous bacterial peritonitis) (HCC) Active Problems:   Anemia   Abdominal pain   Autoimmune hepatitis (HCC)   Cirrhosis, non-alcoholic (HCC)   Primary sclerosing cholangitis   Coagulopathy (HCC)   Hyponatremia   Acute renal failure (ARF) (HCC)   Oral candidiasis   Discharge Condition:   Diet recommendation: regular  Filed Weights   03/02/2015 2227 03/06/15 0900  Weight: 41.277 kg (91 lb) 45.1 kg (99 lb 6.8 oz)    History of present illness:  Melanie Conley is a very unfortunate 25 y.o. female with PMH of autoimmune hepatitis and primary sclerosing cholangitis with cirrhosis who presents to the ED with 1 day of fever and abdominal pain. Patient is typically followed by [facility] gastroenterology and was just discharged from that institution on 03/02/2015 following treatment for SBP. She initially did okay back at home until developing fever and severe abdominal pain earlier today. Her adoptive mother is at the bedside and assists with the history. [name] has had a difficult course recently with serial hospital admissions related to cirrhosis. Complications have included multiple courses of SBP, acute blood loss from esophageal varices, and hepatic encephalopathy. Per [facility] notes, she was apparently on the pediatric transplant list but was never able to be transitioned to the adult last due to an inability to pass the psychosocial screening. She has been managed with prophylactic Bactrim, rifaximin, lactulose, prednisone, Imuran, and ursodiol. She's had numerous banding procedures for her varices. There's been no recent hematemesis, melena, or hematochezia.  In ED, patient was found to have oral temperature  37.9 C, sinus tachycardia in the 110s, and blood pressure in the 100/50 range. Her abdomen was noted to be distended and tender, raising concern for SBP. Paracentesis was performed and fluid seent for analysis. She was started on empiric cefepime and vancomycinfor presumed SBP. Blood work returned notable for sodium of 119, BUN of 42, serum creatinine of 3.0, total bilirubin >50, and INR of 3.9. Serum creatinine was apparently 0.09 at time of discharge from [facility] on 03/02/2015. She had large volume paracentesis performed during that admission but it is unclear if albumin was used to minimize fluid shifts. Pain was controlled with IV hydromorphone in the emergency department, the patient remained hemodynamically stable, and will be admitted to the stepdown unit for ongoing evaluation and management of end-stage liver disease with suspected SBP and concern for development of hepatorenal syndrome.   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  - Has progressed to end-stage; pt not able to pass psychosocial screening to be placed on transplant list per [facility] notes  - Has worsened significantly since discharge from [facility] just 3 days prior  - MELD now 47, corresponding to estimated 3-mo survival of <5%; this could be improved if renal function recovers  -  Has known esophageal varices with history of bleeding and multiple banding procedures; no hematemesis, melena or hematochezia now; BP cannot tolerate propranolol - Has history of hepatic  encephalopathy, continuing lactulose and rifaximin  - Chronically hyponatremic, now down to 119; coagulopathy of liver dz with INR 3.9, thrombocytopenia  - Has ascites, recommended for weekly large-vol para by [facility]; LVP last wk at [facility]   Acute kidney injury concerning for hepatorenal syndrome  - Discharged from [facility] on 03/02/15 with SCr of 0.09, now 3.20  - Uncertain if this is AKI from SBP with sepsis, but more concerned for development of hepatorenal syndrome  - Had lg-vol para at [facility], uncertain if albumin was given to minimize fluid shift as this could have precipitated hepatorenal  - 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 for now with midodrine 7.5 TID, octreotide infusion, and albumin 12.5 g QID  - Repeat chemistries in the am  - Avoid nephrotoxins where possible, avoid fluid shifts   Oropharyngeal candidiasis  - Nystatin swish and swallow QID   Poor overall prognosis-- patient and family not willing to consider hospice--- want full care still.    Procedures:  Paracentesis in ER  Consultations:  Palliative care  Discharge Exam: Filed Vitals:   03/06/15 1500 03/06/15 1518  BP: 101/61   Pulse:    Temp:  98.6 F (37 C)  Resp: 17       Discharge Instructions   Discharge Instructions    Body fluid culture with gram stain    Complete by:  As directed           Current Discharge Medication List    CONTINUE these medications which have NOT CHANGED   Details  azaTHIOprine (IMURAN) 50 MG tablet Take 25 mg by mouth 2 (two) times daily.    HYDROmorphone (DILAUDID) 2 MG tablet Take 2 mg by mouth every 4 (four) hours as needed. For pain    lactulose (CHRONULAC) 10 GM/15ML solution Take 30 mLs (20 g total) by mouth 2 (two) times daily. Qty: 240 mL, Refills: 0    lidocaine (XYLOCAINE) 2 % solution Use as directed 200 mg in the mouth or throat 3 (three) times daily as needed. GI/esophageal upset.    Melatonin 3 MG TABS Take 3 mg by mouth.    metoCLOPramide (REGLAN) 5 MG tablet Take 5  mg by mouth 2 (two) times daily.     omeprazole (PRILOSEC) 40 MG capsule Take 40 mg by mouth daily.    ondansetron (ZOFRAN) 4 MG tablet Take 1 tablet (4 mg total) by mouth every 8 (eight) hours as needed for nausea or vomiting. Qty: 11 tablet, Refills: 0    predniSONE (DELTASONE) 5 MG tablet Take 5 mg by mouth daily with breakfast.    rifaximin (XIFAXAN) 550 MG TABS tablet Take 550 mg by mouth 2 (two) times daily.    sulfamethoxazole-trimethoprim (BACTRIM DS,SEPTRA DS) 800-160 MG tablet Take 1 tablet by mouth daily.    ursodiol (ACTIGALL) 300 MG capsule Take 300 mg by mouth 2 (two) times daily.     furosemide (LASIX) 20 MG tablet Take 2 tablets (40 mg total) by mouth daily. Qty: 60 tablet, Refills: 0    oxyCODONE (ROXICODONE) 5 MG immediate release tablet Take 1 tablet (5 mg total) by mouth every 4 (four) hours as needed for severe pain. Qty: 7 tablet, Refills: 0       Allergies  Allergen Reactions  . Tylenol [Acetaminophen]     Can not take because of Liver  .  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 (pt was in the hospital when this occurred - May 2016) Has patient had a PCN reaction causing immediate rash, facial/tongue/throat swelling, SOB or lightheadedness with hypotension: No Has patient had a PCN reaction causing severe rash involving mucus membranes or skin necrosis: No Has patient had a PCN reaction that required hospitalization Yes Has patient had a PCN reaction occurring within the last 10 years: Yes If all of the above answers are "NO", then moay proceed with Ceph      The results of significant diagnostics from this hospitalization (including imaging, microbiology, ancillary and laboratory) are listed below for reference.    Significant Diagnostic Studies: Dg Chest 2 View  03/12/15  CLINICAL DATA:  Acute onset of fever, nausea and lethargy. Initial encounter. EXAM: CHEST  2 VIEW COMPARISON:  Chest radiograph performed 02/03/2015 FINDINGS: The  lungs are hypoexpanded. There is no evidence of focal opacification, pleural effusion or pneumothorax. The heart is normal in size; the mediastinal contour is within normal limits. No acute osseous abnormalities are seen. IMPRESSION: Lungs hypoexpanded but grossly clear. Electronically Signed   By: Roanna Raider M.D.   On: 03-12-2015 23:42   US Abdomen Complete  02/17/2015  CLINICAL DATA:  Acute onset of liver failure.  Initial encounter. EXAM: ULTRASOUND ABDOMEN COMPLETE DUPLEX ULTRASOUND OF LIVER TECHNIQUE: Color and duplex Doppler ultrasound was performed to evaluate the hepatic in-flow and out-flow vessels. COMPARISON:  CT of the abdomen and pelvis performed 02/15/2015 FINDINGS: Portal Vein Velocities Main:  28.4 cm/sec Right:  17.0 cm/sec Left:  15.6 cm/sec Hepatic Vein Velocities Right:  27.8 cm/sec Middle:  17.7 cm/sec Left:  15.8 cm/sec Hepatic Artery Velocity:  62.8 cm/sec Splenic Vein Velocity:  41.1 cm/sec Varices: Present. Ascites: Present. There is no evidence of splenic vein or portal vein thrombus. Normal hepatopetal flow is noted within the portal venous system, and normal hepatofugal flow is noted within the hepatic veins. Gallbladder: The gallbladder wall is diffusely thickened. This is nonspecific in the presence of ascites. No stones are identified. No pericholecystic fluid is seen. No ultrasonographic Murphy's sign is elicited. Common bile duct: Diameter: 0.2 cm, within normal limits in caliber. Liver: Diffusely nodular in contour, with mild underlying heterogeneity, reflecting hepatic cirrhosis. No definite dominant mass seen. IVC: No abnormality visualized. Pancreas: Visualized portion unremarkable. Spleen: Diffusely enlarged, measuring 15.4 cm in length. Right Kidney: Length: 10.3 cm. Echogenicity within normal limits. No mass or hydronephrosis visualized. Left Kidney: Length: 11.5 cm. Echogenicity within normal limits. No mass or hydronephrosis visualized. Abdominal aorta: No aneurysm  visualized. The aortic bifurcation is not visualized. Other findings: Moderate volume ascites is noted within the abdomen and pelvis. IMPRESSION: 1. Portal and hepatic vein velocities, and hepatic artery and splenic vein velocities, as described above. Normal hepatopetal flow within the portal venous system, and normal hepatofugal flow within the hepatic veins. 2. Varices noted. Moderate volume ascites within the abdomen and pelvis. 3. Findings of hepatic cirrhosis. 4. Splenomegaly. Electronically Signed   By: Roanna Raider M.D.   On: 02/17/2015 21:38   Ct Abdomen Pelvis W Contrast  02/15/2015  CLINICAL DATA:  25 year old female with abdominal trauma EXAM: CT ABDOMEN AND PELVIS WITH CONTRAST TECHNIQUE: Multidetector CT imaging of the abdomen and pelvis was performed using the standard protocol following bolus administration of intravenous contrast. CONTRAST:  80mL OMNIPAQUE IOHEXOL 300 MG/ML  SOLN COMPARISON:  CT dated 01/17/2015 FINDINGS: The visualized lung bases are clear. No intra-abdominal free air. Large  ascites. Cirrhosis. No calcified gallstone. The pancreas, adrenal glands appear unremarkable. Splenomegaly measuring approximately 15 cm in craniocaudal length. Multiple stable appearing wedge-shaped hypodensities along the peripheral aspect of the spleen similar to prior study most compatible with splenic infarcts. No acute traumatic injury or evidence of active bleed. There is no hydronephrosis on either side. The urinary bladder is collapsed. The uterus and the ovaries are grossly unremarkable. No evidence of bowel obstruction. There is diffuse thickening and edema of the colonic wall as well as thickening of the small bowel loops likely related to hepatic enteropathy. Normal appendix. The abdominal aorta and IVC appear patent. There is a circumaortic left renal vein anatomy. The SMV, splenic vein, and main portal vein are patent. The previously described SMV thrombus is correlate visualized on the  current study as a small curvilinear hyperdensity along the wall of the portal vein possibly focal scarring. No portal venous gas identified. Upper abdominal varices including paraesophageal, perigastric, and perisplenic. There is no adenopathy. Small umbilical hernia containing ascitic fluid. Diffuse subcutaneous soft tissue stranding. The osseous structures appear unremarkable. IMPRESSION: No acute/ traumatic intra-abdominal or pelvic pathology. Specifically no evidence of acute solid organ injury. Cirrhosis with portal hypertension, splenomegaly, and ascites. Stable appearing splenic infarcts. Electronically Signed   By: Elgie Collard M.D.   On: 02/15/2015 05:28   [place] Art/ven Flow Abd Pelv Doppler  02/17/2015  CLINICAL DATA:  Acute onset of liver failure.  Initial encounter. EXAM: ULTRASOUND ABDOMEN COMPLETE DUPLEX ULTRASOUND OF LIVER TECHNIQUE: Color and duplex Doppler ultrasound was performed to evaluate the hepatic in-flow and out-flow vessels. COMPARISON:  CT of the abdomen and pelvis performed 02/15/2015 FINDINGS: Portal Vein Velocities Main:  28.4 cm/sec Right:  17.0 cm/sec Left:  15.6 cm/sec Hepatic Vein Velocities Right:  27.8 cm/sec Middle:  17.7 cm/sec Left:  15.8 cm/sec Hepatic Artery Velocity:  62.8 cm/sec Splenic Vein Velocity:  41.1 cm/sec Varices: Present. Ascites: Present. There is no evidence of splenic vein or portal vein thrombus. Normal hepatopetal flow is noted within the portal venous system, and normal hepatofugal flow is noted within the hepatic veins. Gallbladder: The gallbladder wall is diffusely thickened. This is nonspecific in the presence of ascites. No stones are identified. No pericholecystic fluid is seen. No ultrasonographic Murphy's sign is elicited. Common bile duct: Diameter: 0.2 cm, within normal limits in caliber. Liver: Diffusely nodular in contour, with mild underlying heterogeneity, reflecting hepatic cirrhosis. No definite dominant mass seen. IVC: No abnormality  visualized. Pancreas: Visualized portion unremarkable. Spleen: Diffusely enlarged, measuring 15.4 cm in length. Right Kidney: Length: 10.3 cm. Echogenicity within normal limits. No mass or hydronephrosis visualized. Left Kidney: Length: 11.5 cm. Echogenicity within normal limits. No mass or hydronephrosis visualized. Abdominal aorta: No aneurysm visualized. The aortic bifurcation is not visualized. Other findings: Moderate volume ascites is noted within the abdomen and pelvis. IMPRESSION: 1. Portal and hepatic vein velocities, and hepatic artery and splenic vein velocities, as described above. Normal hepatopetal flow within the portal venous system, and normal hepatofugal flow within the hepatic veins. 2. Varices noted. Moderate volume ascites within the abdomen and pelvis. 3. Findings of hepatic cirrhosis. 4. Splenomegaly. Electronically Signed   By: Roanna Raider M.D.   On: 02/17/2015 21:38    Microbiology: Recent Results (from the past 240 hour(s))  Urine culture     Status: None (Preliminary result)   Collection Time: 2015-03-07  9:33 PM  Result Value Ref Range Status   Specimen Description URINE, CLEAN CATCH  Final   Special  Requests NONE  Final   Culture NO GROWTH < 24 HOURS  Final   Report Status PENDING  Incomplete  Rapid strep screen     Status: None   Collection Time: 03/11/2015 10:39 PM  Result Value Ref Range Status   Streptococcus, Group A Screen (Direct) NEGATIVE NEGATIVE Final    Comment: (NOTE) A Rapid Antigen test may result negative if the antigen level in the sample is below the detection level of this test. The FDA has not cleared this test as a stand-alone test therefore the rapid antigen negative result has reflexed to a Group A Strep culture.   Culture, group A strep     Status: None (Preliminary result)   Collection Time: 03/07/2015 10:39 PM  Result Value Ref Range Status   Specimen Description THROAT  Final   Special Requests Immunocompromised  Final   Culture CULTURE  REINCUBATED FOR BETTER GROWTH  Final   Report Status PENDING  Incomplete  MRSA PCR Screening     Status: None   Collection Time: 03/06/15  9:03 AM  Result Value Ref Range Status   MRSA by PCR NEGATIVE NEGATIVE Final    Comment:        The GeneXpert MRSA Assay (FDA approved for NASAL specimens only), is one component of a comprehensive MRSA colonization surveillance program. It is not intended to diagnose MRSA infection nor to guide or monitor treatment for MRSA infections.      Labs: Basic Metabolic Panel:  Recent Labs Lab 03/13/2015 2034 03/06/15 0335  NA 119* 121*  K 3.5 3.5  CL 82* 85*  CO2 22 22  GLUCOSE 118* 78  BUN 42* 44*  CREATININE 3.00* 3.20*  CALCIUM 9.0 8.4*   Liver Function Tests:  Recent Labs Lab 03/26/2015 2034 03/06/15 0335  AST 48* 32  ALT 18 18  ALKPHOS 72 54  BILITOT >50.0* >50.0*  PROT 6.1* 5.6*  ALBUMIN 3.3* 2.7*    Recent Labs Lab 03/08/2015 2034  LIPASE 30   No results for input(s): AMMONIA in the last 168 hours. CBC:  Recent Labs Lab 03/02/2015 2034 03/06/15 0335  WBC 8.4 6.8  NEUTROABS  --  5.0  HGB 10.6* 8.9*  HCT 29.6* 24.8*  MCV 96.7 95.8  PLT 131* 109*   Cardiac Enzymes: No results for input(s): CKTOTAL, CKMB, CKMBINDEX, TROPONINI in the last 168 hours. BNP: BNP (last 3 results)  Recent Labs  03/06/15 0335  BNP 17.4    ProBNP (last 3 results) No results for input(s): PROBNP in the last 8760 hours.  CBG:  Recent Labs Lab 03/06/15 0857 03/06/15 0938 03/06/15 1113  GLUCAP 53* 60* 70       Signed:  Nagee Goates U Johanne Mcglade DO.  [facility] 03/06/2015, 3:43 PM

## 2015-03-06 NOTE — Progress Notes (Signed)
Pharmacy Antibiotic Note Melanie Conley is a 25 y.o. female admitted on 03/13/2015 with generalized weakness, hepatic encephalopathy, hyponatremia in setting autoimmune hepatitis and AKI. Pharmacy has been consulted for Cefepime and vancomycin dosing for potential sepsis/SBP.  Plan: - Change vancomycin to 500 mg IV q48hr in setting of worsening renal function - Continue cefepime 2 gm IV q24h - Monitor plans for transfer to [facility]  Temp (24hrs), Avg:98.9 F (37.2 C), Min:98.1 F (36.7 C), Max:100.2 F (37.9 C)   Recent Labs Lab 03/22/2015 2034 03/16/2015 2213 03/06/15 0032 03/06/15 0335  WBC 8.4  --   --  6.8  CREATININE 3.00*  --   --  3.20*  LATICACIDVEN  --  2.49* 1.22 1.0    Estimated Creatinine Clearance: 17.7 mL/min (by C-G formula based on Cr of 3.2).    Allergies  Allergen Reactions  . Tylenol [Acetaminophen]     Can not take because of Liver  . 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 (pt was in the hospital when this occurred - May 2016) Has patient had a PCN reaction causing immediate rash, facial/tongue/throat swelling, SOB or lightheadedness with hypotension: No Has patient had a PCN reaction causing severe rash involving mucus membranes or skin necrosis: No Has patient had a PCN reaction that required hospitalization Yes Has patient had a PCN reaction occurring within the last 10 years: Yes If all of the above answers are "NO", then moay proceed with Ceph    Antimicrobials this admission: 2/5 vancomycin  >> 2/5 Cefepime  >>   Dose adjustments this admission: n/a  Microbiology results: MRSA PCR neg 2/5 BCx: sent Rapid strep screen negatvie Group A strep culture - pending  Thank you for allowing pharmacy to be a part of this patient's care.  Jakarius Flamenco L. Roseanne Reno, PharmD PGY2 Infectious Diseases Pharmacy Resident Pager: [phone] 03/06/2015 1:31 PM

## 2015-03-06 NOTE — H&P (Signed)
[facility] History and Physical  Melanie Conley:[id number] DOB: [birth year] DOA: 03/06/2015  Referring physician: ED physician PCP: Woodfin Ganja, MD  Specialists:  [facility]   Chief Complaint:  Abdominal pain, fever   HPI: Melanie Conley is a very unfortunate 25 y.o. female with PMH of autoimmune hepatitis and primary sclerosing cholangitis with cirrhosis who presents to the ED with 1 day of fever and abdominal pain. Patient is typically followed by [facility] gastroenterology and was just discharged from that institution on 03/02/2015 following treatment for SBP. She initially did okay back at home until developing fever and severe abdominal pain earlier today. Her adoptive mother is at the bedside and assists with the history. Melanie Conley has had a difficult course recently with serial hospital admissions related to cirrhosis. Complications have included multiple courses of SBP, acute blood loss from esophageal varices, and hepatic encephalopathy. Per [facility] notes, she was apparently on the pediatric transplant list but was never able to be transitioned to the adult last due to an inability to pass the psychosocial screening. She has been managed with prophylactic Bactrim, rifaximin, lactulose, prednisone, Imuran, and ursodiol. She's had numerous banding procedures for her varices. There's been no recent hematemesis, melena, or hematochezia.  In ED, patient was found to have oral temperature 37.9 C, sinus tachycardia in the 110s, and blood pressure in the 100/50 range. Her abdomen was noted to be distended and tender, raising concern for SBP. Paracentesis was performed and fluid seent for analysis.  She was started on empiric cefepime and vancomycinfor presumed SBP. Blood work returned notable for sodium of 119, BUN of 42, serum creatinine of 3.0, total bilirubin >50, and INR of 3.9. Serum creatinine was apparently 0.09 at time of discharge from [facility] on 03/02/2015. She had large  volume paracentesis performed during that admission but it is unclear if albumin was used to minimize fluid shifts. Pain was controlled with IV hydromorphone in the emergency department, the patient remained hemodynamically stable, and will be admitted to the stepdown unit for ongoing evaluation and management of end-stage liver disease with suspected SBP and concern for development of hepatorenal syndrome.   Where does patient live?   At home     Can patient participate in ADLs?  Yes         Review of Systems:   Unable to obtain secondary to patient's clinical condition with confusion and somnolence following narcotic analgesia  Allergy:  Allergies  Allergen Reactions  . Tylenol [Acetaminophen]     Can not take because of Liver  . 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 (pt was in the hospital when this occurred - May 2016) Has patient had a PCN reaction causing immediate rash, facial/tongue/throat swelling, SOB or lightheadedness with hypotension: No Has patient had a PCN reaction causing severe rash involving mucus membranes or skin necrosis: No Has patient had a PCN reaction that required hospitalization Yes Has patient had a PCN reaction occurring within the last 10 years: Yes If all of the above answers are "NO", then moay proceed with Ceph    Past Medical History  Diagnosis Date  . Anemia 2012  . Esophageal varices (HCC) 2015    s/p multiple EGDs and bandings dating to 2015.   Melanie Conley Autoimmune hepatitis (HCC) 2003    with overlapping primary sclerosing cholangitis  . GIB (gastrointestinal bleeding)   . Cirrhosis (HCC) 2003  . Irregular periods 08/04/2014  . Vaginal Pap smear, abnormal   .  Hepatic encephalopathy (HCC)   . Coagulopathy (HCC)   . STD (female)     mutiple. risky sexual behavior.   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 Ascites 2003  . Psychosis     Past Surgical History  Procedure Laterality Date  . Gastric banding port revision    .  Tonsillectomy    . Esophagogastroduodenoscopy  07/22/2011    Procedure: ESOPHAGOGASTRODUODENOSCOPY (EGD);  Surgeon: Graylin Shiver, MD;  Location: [facility] ENDOSCOPY;  Service: Endoscopy;  Laterality: N/A;  . Esophagogastroduodenoscopy N/A 09/11/2014    Procedure: ESOPHAGOGASTRODUODENOSCOPY (EGD);  Surgeon: Charlott Rakes, MD;  Location: [facility] ENDOSCOPY;  Service: Endoscopy;  Laterality: N/A;    Social History:  reports that she has quit smoking. Her smoking use included Cigarettes. She has a 4 pack-year smoking history. She has never used smokeless tobacco. She reports that she does not drink alcohol or use illicit drugs.  Family History:  Family History  Problem Relation Age of Onset  . Adopted: Yes     Prior to Admission medications   Medication Sig Start Date End Date Taking? Authorizing Provider  azaTHIOprine (IMURAN) 50 MG tablet Take 25 mg by mouth 2 (two) times daily.   Yes Historical Provider, MD  HYDROmorphone (DILAUDID) 2 MG tablet Take 2 mg by mouth every 4 (four) hours as needed. For pain 02/26/15 03/12/15 Yes Historical Provider, MD  lactulose (CHRONULAC) 10 GM/15ML solution Take 30 mLs (20 g total) by mouth 2 (two) times daily. Patient taking differently: Take 20 g by mouth 3 (three) times daily.  09/13/14  Yes Vilinda Blanks Minor, NP  lidocaine (XYLOCAINE) 2 % solution Use as directed 200 mg in the mouth or throat 3 (three) times daily as needed. GI/esophageal upset. 02/15/15  Yes Historical Provider, MD  Melatonin 3 MG TABS Take 3 mg by mouth. 02/26/15  Yes Historical Provider, MD  metoCLOPramide (REGLAN) 5 MG tablet Take 5 mg by mouth 2 (two) times daily.    Yes Historical Provider, MD  omeprazole (PRILOSEC) 40 MG capsule Take 40 mg by mouth daily.   Yes Historical Provider, MD  ondansetron (ZOFRAN) 4 MG tablet Take 1 tablet (4 mg total) by mouth every 8 (eight) hours as needed for nausea or vomiting. 01/22/15  Yes Courteney Lyn Mackuen, MD  predniSONE (DELTASONE) 5 MG tablet Take 5 mg by  mouth daily with breakfast.   Yes Historical Provider, MD  rifaximin (XIFAXAN) 550 MG TABS tablet Take 550 mg by mouth 2 (two) times daily.   Yes Historical Provider, MD  sulfamethoxazole-trimethoprim (BACTRIM DS,SEPTRA DS) 800-160 MG tablet Take 1 tablet by mouth daily. 02/08/15  Yes Lonia Blood, MD  ursodiol (ACTIGALL) 300 MG capsule Take 300 mg by mouth 2 (two) times daily.    Yes Historical Provider, MD  furosemide (LASIX) 20 MG tablet Take 2 tablets (40 mg total) by mouth daily. Patient taking differently: Take 20 mg by mouth 2 (two) times daily.  10/12/14   Shanker Levora Dredge, MD  oxyCODONE (ROXICODONE) 5 MG immediate release tablet Take 1 tablet (5 mg total) by mouth every 4 (four) hours as needed for severe pain. 02/17/15   Eyvonne Mechanic, PA-C    Physical Exam: Filed Vitals:   03/07/2015 2215 03/18/2015 2227 03/18/2015 2315 03/06/15 0045  BP: 109/75  103/57 100/51  Pulse: 102  94 96  Temp:      TempSrc:      Resp:   20   Weight:  41.277 kg (91 lb)  SpO2: 100%  95% 98%   General: Not in acute distress. Somnolent HEENT:       Eyes: PERRL, EOMI, or conjunctival pallor. Icteric sclerae       ENT: No discharge from the ears or nose, no pharyngeal ulcers, White plaques on soft palate and buccal mucosa        Neck: No JVD, no bruit, no appreciable mass Heme: No cervical adenopathy, no pallor Cardiac: Rate ~100 and regular with grade 4 holosystolic murmur throughout precordium. No gallops or rubs. Pulm: Good air movement bilaterally. No rales, wheezing, rhonchi or rubs. Abd: Soft, distended, tender throughout, fluid-wave, no rebound pain or gaurding, BS present. Ext: No LE edema bilaterally. 2+DP/PT pulse bilaterally. Musculoskeletal: No gross deformity, no red, hot, swollen joints   Skin: No rashes or wounds on exposed surfaces  Neuro: Somnolent, arousable, oriented to person and time, not place, cranial nerves II-XII grossly intact. Asterixis. No focal findings Psych: Patient is not  overtly psychotic.  Labs on Admission:  Basic Metabolic Panel:  Recent Labs Lab 03/17/2015 2034  NA 119*  K 3.5  CL 82*  CO2 22  GLUCOSE 118*  BUN 42*  CREATININE 3.00*  CALCIUM 9.0   Liver Function Tests:  Recent Labs Lab 03/23/2015 2034  AST 48*  ALT 18  ALKPHOS 72  BILITOT >50.0*  PROT 6.1*  ALBUMIN 3.3*    Recent Labs Lab 03/12/2015 2034  LIPASE 30   No results for input(s): AMMONIA in the last 168 hours. CBC:  Recent Labs Lab 03/19/2015 2034  WBC 8.4  HGB 10.6*  HCT 29.6*  MCV 96.7  PLT 131*   Cardiac Enzymes: No results for input(s): CKTOTAL, CKMB, CKMBINDEX, TROPONINI in the last 168 hours.  BNP (last 3 results) No results for input(s): BNP in the last 8760 hours.  ProBNP (last 3 results) No results for input(s): PROBNP in the last 8760 hours.  CBG: No results for input(s): GLUCAP in the last 168 hours.  Radiological Exams on Admission: Dg Chest 2 View  03/25/2015  CLINICAL DATA:  Acute onset of fever, nausea and lethargy. Initial encounter. EXAM: CHEST  2 VIEW COMPARISON:  Chest radiograph performed 02/03/2015 FINDINGS: The lungs are hypoexpanded. There is no evidence of focal opacification, pleural effusion or pneumothorax. The heart is normal in size; the mediastinal contour is within normal limits. No acute osseous abnormalities are seen. IMPRESSION: Lungs hypoexpanded but grossly clear. Electronically Signed   By: Roanna Raider M.D.   On: 03/06/2015 23:42    EKG:  Not done in ED, will obtain as appropriate   Assessment/Plan  1. Sepsis suspected secondary to SBP  - Recent admission here for SBP, followed by 2 more admissions to [facility] with SBP, last discharged 03/02/15 - Presenting with fever, tachycardia, and abdominal px  - Diagnostic paracentesis performed by EDP with fluid studies pending  - Empiric cefepime and vancomycin  - Blood cultures sent, will follow  - Trend lactate, fever curve    2. 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  - Has progressed to end-stage; pt not able to pass psychosocial screening to be placed on transplant list per [facility] notes  - Has worsened significantly since discharge from [facility] just 3 days prior  - MELD now 47, corresponding to estimated 3-mo survival of <5%; this could be improved if renal function recovers  - Has known esophageal varices with history of bleeding and multiple banding procedures; no hematemesis, melena or hematochezia now; BP cannot tolerate  propranolol - Has history of hepatic encephalopathy, continuing lactulose and rifaximin  - Chronically hyponatremic, now down to 119; coagulopathy of liver dz with INR 3.9, thrombocytopenia  - Has ascites, recommended for weekly large-vol para by [facility]; LVP last wk at [facility]   3. Acute kidney injury concerning for hepatorenal syndrome  - Discharged from [facility] on 03/02/15 with SCr of 0.09, now 3.00  - Uncertain if this is AKI from SBP with sepsis, but more concerned for development of hepatorenal syndrome  - Had lg-vol para at [facility], uncertain if albumin was given to minimize fluid shift as this could have precipitated hepatorenal  - 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 for now with midodrine 7.5 TID, octreotide infusion, and albumin 12.5 g QID  - Repeat chemistries in the am  - Avoid nephrotoxins where possible, avoid fluid shifts   4. Oropharyngeal candidiasis  - Nystatin swish and swallow QID     DVT ppx: SQ Heparin     Code Status: Full code Family Communication:  Yes, patient's adoptive mother at bed side Disposition Plan: Admit to inpatient   Date of Service 03/06/2015    Briscoe Deutscher, MD [facility] Pager [phone]  If 7PM-7AM, please contact night-coverage [email or web address] Password [email or web address] 03/06/2015, 1:40 AM

## 2015-03-06 NOTE — ED Notes (Signed)
Rate of Albumin increased at this time to 14ml/hr

## 2015-03-06 NOTE — Progress Notes (Addendum)
Poor overall prognosis-- wants transferred back to [facility] as they just d/c'd her 3 days prior.  ?hepatorenal syndrome.  Not willing to speak with palliative care Updated mother -called Melanie Conley [phone] to start transfer process -does not appear labs were done on paracentesis fluid  Marlin Canary

## 2015-03-06 NOTE — Progress Notes (Signed)
Initial Nutrition Assessment  DOCUMENTATION CODES:   Underweight, Severe malnutrition in context of chronic illness  INTERVENTION:    Ensure Enlive PO BID, each supplement provides 350 kcal and 20 grams of protein  NUTRITION DIAGNOSIS:   Malnutrition related to chronic illness as evidenced by severe depletion of muscle mass, severe depletion of body fat, percent weight loss (12% weight loss within the past month).  GOAL:   Patient will meet greater than or equal to 90% of their needs  MONITOR:   PO intake, Labs, Weight trends, Supplement acceptance  REASON FOR ASSESSMENT:   Consult Assessment of nutrition requirement/status  ASSESSMENT:   25 y.o. female with PMH of autoimmune hepatitis and primary sclerosing cholangitis with cirrhosis who presents to the ED with 1 day of fever and abdominal pain. Patient is typically followed by [facility] gastroenterology and was just discharged from that institution on 03/02/2015 following treatment for SBP. She initially did okay back at home until developing fever and severe abdominal pain earlier today.  Spoke with patient, she was in pain (notified RN) and unable to provide much nutrition hx. She says she has lost weight, but unsure how much or her usual weight. Diet just advanced to regular this AM. Plans for transfer to [facility] when bed available.  Diet Order:  Diet regular Room service appropriate?: Yes; Fluid consistency:: Thin  Skin:  Reviewed, no issues  Last BM:  unknown  Height:   Ht Readings from Last 1 Encounters:  02/15/15  (1.499 m)    Weight:   Wt Readings from Last 1 Encounters:  2015/03/17 91 lb (41.277 kg)    Ideal Body Weight:  44.5 kg  BMI:  Body mass index is 18.37 kg/(m^2).  Estimated Nutritional Needs:   Kcal:  1400-1600  Protein:  70-85 gm  Fluid:  1.4-1.6 L  EDUCATION NEEDS:   No education needs identified at this time  Joaquin Courts, RD, LDN, CNSC Pager [phone] After Hours Pager  [phone]

## 2015-03-06 NOTE — Progress Notes (Addendum)
Awaiting MD acceptance at [facility].  Resident agreed patient needs to be transferred for continuity of care -transfer center has called Dr 4 x with no answer  Marlin Canary DO

## 2015-03-06 NOTE — Progress Notes (Signed)
Patient has been accepted by [facility]- Dr. Marcheta Grammes (GI Med) and will go on the service of Dr. Stasia Cavalier (IM)-- once bed available.  Melanie Conley

## 2015-03-06 NOTE — Consult Note (Signed)
Consultation Note Date: 03/06/2015   Patient Name: Melanie Conley  DOB: 1990-05-02  MRN: [id number]  Age / Sex: 25 y.o., female  PCP: Rayvon Char, MD Referring Physician: Geradine Girt, DO  Reason for Consultation: Establishing goals of care  Clinical Assessment/Narrative: Ms. Landsberg is a 25 y.o. 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 She was just discharged from [facility] on 03/02/2015 following treatment for SBP. She has had recurrent hospital admissions due to multiple courses of SBP, acute blood loss from esophageal varices, and hepatic encephalopathy. She is not a candidate for transplant at [facility]. She has been managed with prophylactic Bactrim, rifaximin, lactulose, prednisone, Imuran, and ursodiol, but it appears from chart review that she has not been taking these consistently at times. She is admitted to the stepdown unit for ongoing evaluation and management of end-stage liver disease with suspected SBP and concern for development of hepatorenal syndrome. Palliative consulted for goals of care discussion.  I met with Ms. Gamboa and she remained largely somnolent throughout encounter.  She opened her eyes long enough to say that she did not want to talk.  I called and talked with her mother, Surie Suchocki.  Contacts/Participants in Discussion: Patient's mother Primary Decision Maker: Grant Fontana Relationship to Patient Mother (pt is adopted)  SUMMARY OF RECOMMENDATIONS - I spoke with patient's mother via phone about her current clinical situation and that her condition continues to progress. I shared with her my concern that, as Rejeana now has renal involvement, this may be a sign we are moving into a new chapter in her disease progression. - Her mother recently discussed options with providers at [place] during her recent hospitalization and is still desiring for full aggressive care. - She did not want to participate in conversation to discuss options other than continuing aggressive care moving forward. - I think her mother may desire transfer back to [place], where [place] has been getting her care and is well known to their services.   Code Status/Advance Care Planning: Full code    Code Status Orders        Start     Ordered   03/06/15 0132  Full code   Continuous     03/06/15 0140    Code Status History    Date Active Date Inactive Code Status Order ID Comments User Context   02/03/2015  4:07 PM 02/06/2015  7:34 PM Full Code [id number]  Radene Gunning, NP ED   01/17/2015  5:06 PM 01/18/2015  3:21 PM Full Code [id number]  Caren Griffins, MD ED   11/23/2014  4:47 AM 11/25/2014  3:18 PM Full Code [id number]  April Palumbo, MD ED   11/20/2014  8:26 AM 11/20/2014  2:37 PM Full Code [id number]  Debbe Odea, MD ED   10/09/2014  8:13 AM 10/12/2014  6:09 PM Full Code [id number]  Gennaro Africa, MD ED   09/11/2014  9:31 AM 09/13/2014  7:43 PM Full Code [id number]  Melton Alar, PA-C Inpatient   09/05/2014  6:40 AM 09/07/2014  7:06 PM Full Code [id number]  Lavina Hamman, MD ED   07/30/2014  2:54 AM 07/31/2014  8:06 PM Full Code [id number]  Ivor Costa, MD Inpatient   06/04/2014  5:27 AM 06/09/2014 12:59 AM Full Code [id number]  Theressa Millard, MD Inpatient   05/27/2014  4:00 PM 05/29/2014  9:23 PM  Full Code [id number]  Orson Eva, MD Inpatient   01/12/2014  6:23 PM 01/16/2014  6:49 PM Full Code [id number]  Reyne Dumas, MD Inpatient   07/22/2011  3:42 AM 07/24/2011 12:10 AM Full Code [id number]  Arlyss Repress, MD ED     Symptom Management:   Pain: Currently ordered Dilaudid when necessary. Continue same.  Palliative Prophylaxis:   Aspiration, Bowel Regimen, Delirium Protocol and Frequent Pain Assessment  Additional Recommendations (Limitations, Scope, Preferences):  Full Scope  Treatment  Psycho-social/Spiritual:  Support System: Fair. Her mother reports the patient is now living with her again. In speaking with her mother, this seems to be a much better social situation than last time that I met with Ms. Guallpa and she was living with a boyfriend Desire for further Chaplaincy support:no Additional Recommendations: Referral to Community Resources   Prognosis: Poor. Her disease continued to progress and she is now showing signs of renal involvement.  Discharge Planning: To be determined. I think her mother may desire transfer back to [facility]   Chief Complaint/ Primary Diagnoses: Present on Admission:  . Hyponatremia . SBP (spontaneous bacterial peritonitis) ([place]) . Primary sclerosing cholangitis . Autoimmune hepatitis ([place]) . Cirrhosis, non-alcoholic ([place]) . Abdominal pain . Acute renal failure (ARF) ([place]) . Coagulopathy ([place]) . Anemia . Oral candidiasis  I have reviewed the medical record, interviewed the patient and family, and examined the patient. The following aspects are pertinent.  Past Medical History  Diagnosis Date  . Anemia 2012  . Esophageal varices ([place]) 2015    s/p multiple EGDs and bandings dating to 2015.   Marland Kitchen Autoimmune hepatitis ([place]) 2003    with overlapping primary sclerosing cholangitis  . GIB (gastrointestinal bleeding)   . Cirrhosis ([place]) 2003  . Irregular periods 08/04/2014  . Vaginal Pap smear, abnormal   . Hepatic encephalopathy ([place])   . Coagulopathy ([place])   . STD (female)     mutiple. risky sexual behavior.   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 Ascites 2003  . Psychosis    Social History   Social History  . Marital Status: Single    Spouse Name: N/A  . Number of Children: N/A  . Years of Education: N/A   Social History Main Topics  . Smoking status: Former Smoker -- 0.50 packs/day for 8 years    Types: Cigarettes  . Smokeless tobacco: Never Used  . Alcohol Use: No  . Drug Use: No  . Sexual Activity: Not  Asked   Other Topics Concern  . 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       History of Present Illness: Patient is a pleasant 25 year old [place] American female who was diagnosed with liver disease at age 72 when she presented with a GI bleed. She has undergone liver biopsy and ERCP in 2003. 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 Her complications of cirrhosis include bleeding esophageal varicies, ascites, SBP and encephalopathy. She was listed for OLT at [facility] in 2006 but made status 7 due to a low MELD score and a good quality of life. We have been unable to reactivate her as an adult because she has not passed the psychosocial evaluation. She has undergone formal neuropsych testing. In the past, she has stated that she does not wish to undergo  OLT even if she needs it. In regards to her AIH, her serologies are negative and she has been on Imuran and prednisone since her diagnosis in 2003. Regarding her [place], she has had one bout of cholangitis years ago per report. She denies a history of stenting or dominant biliary stricture or choledolcholithiasis. She was previously on high dose ursodiol.       She presents today with her Mom. I last saw her in Feb 2015. She has been hospitalized here at [facility] for abdominal pain/worsening ascites in 11/2013 and the again at [facility] in 12/2013 for SBP despite being on Levoquin prophylaxis. Her outside records from 12/2013 were reviewed in [facility]. Ascites fluid showed 7866 nuc cells with 83% polys. Blood Ctx were negative. She had 3 days of IV Rocephin with IV albumin on D1 and D3 as well as 7 days of Augmentin. She also had a 2.7L paracentesis. Stool studies were neg (C diff and immunocompromised host panel). She also had a CT scan which was unremarkable for acute event- patent PV. She denies any worsening jaundice,  pruritis, SOB, CP, increased abdominal girth, LE edema, melena, BRBPR, confusion, depression, nausea, vomiting, or diarrhea. Her abdominal pain has resolved and her ascites is well controlled on aldactone 167m and lasix 458mdaily. She is following a low Na diet.      Cirrhosis Care:    1.[place]: MRI 09/06/13 and CT scan 12/13/13 cirrhosis, ascites, marked splenomegaly, no masses, biliary stricturing   2.Varicies surveillance: EGD 07/17/13 Grade II esophageal varices with EVL X 2. Missed follow-up.   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    -- Liver biopsy 08/03/01 minute fragments bile duct proliferation and periductular fibrosis; hepatitis G1-2, S2; ERCP 09/23/01 Subtle changes/narrowing but no dominant strictures; MRCP images 10/08 showed focal narrowing in right hepatic system; serologies ANA neg ASMA neg, antiLKMI neg AMA neg IgG normal (low level + ASMA 1:20 in 2003)    -- Occult GI bleeding with many EGD's, VCE 11/09 normal, Colonoscopy 7/06 and ?2/10 with biopsies negative for IBD including TI.    -- seasonal allergies    -- s/p tonsilectomy    -- Immune to HAV and HBV (2007)    -- EBV IgG pos CMV IgG and IgM neg       Social History:    [place] Zu[place] the middle child (she is adopted).   Her Mom teaches [employer]   No tobacco or alcohol.    Enaya had been at [employer] but is currently working in [employer]  Pets: 1 dog and 1 cat       Family History  Problem Relation Age of Onset  . Adopted: Yes   Scheduled Meds: . albumin human  12.5 g Intravenous 4 times per day  .  azaTHIOprine  25 mg Oral BID  . ceFEPime (MAXIPIME) IV  2 g Intravenous Q24H  . lactulose  20 g Oral TID  . metoCLOPramide  5 mg Oral BID  . midodrine  7.5 mg Oral TID WC  . nystatin  5 mL Mouth/Throat QID  . pantoprazole (PROTONIX)  IV  40 mg Intravenous Q12H  . predniSONE  5 mg Oral Q breakfast  . rifaximin  550 mg Oral BID  . sodium chloride flush  3 mL Intravenous Q12H  . sodium chloride flush  3 mL Intravenous Q12H  . ursodiol  300 mg Oral BID  . vancomycin  500 mg Intravenous Q24H   Continuous Infusions: . dextrose 5 % and 0.9% NaCl    . octreotide  (SANDOSTATIN)    IV infusion 50 mcg (03/06/15 0900)   PRN Meds:.sodium chloride, HYDROmorphone (DILAUDID) injection, Melatonin, ondansetron **OR** ondansetron (ZOFRAN) IV, sodium chloride flush Medications Prior to Admission:  Prior to Admission medications   Medication Sig Start Date End Date Taking? Authorizing Provider  azaTHIOprine (IMURAN) 50 MG tablet Take 25 mg by mouth 2 (two) times daily.   Yes Historical Provider, MD  HYDROmorphone (DILAUDID) 2 MG tablet Take 2 mg by mouth every 4 (four) hours as needed. For pain 02/26/15 03/12/15 Yes Historical Provider, MD  lactulose (CHRONULAC) 10 GM/15ML solution Take 30 mLs (20 g total) by mouth 2 (two) times daily. Patient taking differently: Take 20 g by mouth 3 (three) times daily.  09/13/14  Yes Grace Bushy Minor, NP  lidocaine (XYLOCAINE) 2 % solution Use as directed 200 mg in the mouth or throat 3 (three) times daily as needed. GI/esophageal upset. 02/15/15  Yes Historical Provider, MD  Melatonin 3 MG TABS Take 3 mg by mouth. 02/26/15  Yes Historical Provider, MD  metoCLOPramide (REGLAN) 5 MG tablet Take 5 mg by mouth 2 (two) times daily.    Yes Historical Provider, MD  omeprazole (PRILOSEC) 40 MG capsule Take 40 mg by mouth daily.   Yes Historical Provider, MD  ondansetron (ZOFRAN) 4 MG tablet Take 1 tablet (4 mg total) by mouth every 8 (eight) hours as needed for nausea or vomiting.  01/22/15  Yes Courteney Lyn Mackuen, MD  predniSONE (DELTASONE) 5 MG tablet Take 5 mg by mouth daily with breakfast.   Yes Historical Provider, MD  rifaximin (XIFAXAN) 550 MG TABS tablet Take 550 mg by mouth 2 (two) times daily.   Yes Historical Provider, MD  sulfamethoxazole-trimethoprim (BACTRIM DS,SEPTRA DS) 800-160 MG tablet Take 1 tablet by mouth daily. 02/08/15  Yes Cherene Altes, MD  ursodiol (ACTIGALL) 300 MG capsule Take 300 mg by mouth 2 (two) times daily.    Yes Historical Provider, MD  furosemide (LASIX) 20 MG tablet Take 2 tablets (40 mg total) by mouth daily. Patient taking differently: Take 20 mg by mouth 2 (two) times daily.  10/12/14   Shanker Kristeen Mans, MD  oxyCODONE (ROXICODONE) 5 MG immediate release tablet Take 1 tablet (5 mg total) by mouth every 4 (four) hours as needed for severe pain. 02/17/15   Okey Regal, PA-C   Allergies  Allergen Reactions  . Tylenol [Acetaminophen]     Can not take because of Liver  . 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 (pt was in the hospital when this occurred - May 2016) Has patient had a PCN reaction causing immediate rash, facial/tongue/throat swelling, SOB or lightheadedness with hypotension: No Has patient had a PCN reaction causing severe rash involving mucus membranes or skin necrosis: No Has patient had a PCN reaction that required hospitalization Yes Has patient had a PCN reaction occurring within the last 10 years: Yes If all of the above answers are "NO", then moay proceed with Ceph    Review of Systems  Unable to complete due to  mental status  Physical Exam  General: No distress. Somnolent HEENT:Icteric sclerae Neck: No JVD, no bruit, no appreciable mass Cardiac: Rate tachycardic, regular with murmur throughout precordium. No gallops or rubs. Pulm: Good air movement bilaterally. No rales, wheezing, rhonchi or rubs. Abd: Soft, distended, tender throughout,  no rebound pain or gaurding, BS present. Ext:  No LE edema bilaterally. Musculoskeletal: No gross deformity  Skin: No rashes or wounds on exposed surfaces  Neuro: Somnolent, arousable,   Vital Signs: BP 93/43 mmHg  Pulse 92  Temp(Src) 98.1 F (36.7 C) (Oral)  Resp 18  Wt 41.277 kg (91 lb)  SpO2 98%  SpO2: SpO2: 98 % O2 Device:SpO2: 98 % O2 Flow Rate: .   IO: Intake/output summary:  Intake/Output Summary (Last 24 hours) at 03/06/15 1158 Last data filed at 03/06/15 1000  Gross per 24 hour  Intake     59 ml  Output      0 ml  Net     59 ml    LBM:   Baseline Weight: Weight: 41.277 kg (91 lb) (per mothers report at bedside) Most recent weight: Weight: 41.277 kg (91 lb) (per mothers report at bedside)      Palliative Assessment/Data:  Flowsheet Rows        Most Recent Value   Intake Tab    Referral Department  Hospitalist   Unit at Time of Referral  ICU   Palliative Care Primary Diagnosis  Other (Comment) [end stage liver disease]   Date Notified  03/08/2015   Palliative Care Type  Return patient Palliative Care   Reason for referral  Clarify Goals of Care, Psychosocial or Spiritual support   Date of Admission  03/11/2015   Date first seen by Palliative Care  03/06/15   # of days Palliative referral response time  1 Day(s)   # of days IP prior to Palliative referral  0   Clinical Assessment    Palliative Performance Scale Score  60%   Pain Max last 24 hours  Not able to report   Pain Min Last 24 hours  Not able to report   Psychosocial & Spiritual Assessment    Palliative Care Outcomes    Patient/Family meeting held?  No      Additional Data Reviewed:  CBC:    Component Value Date/Time   WBC 6.8 03/06/2015 0335   HGB 8.9* 03/06/2015 0335   HCT 24.8* 03/06/2015 0335   PLT 109* 03/06/2015 0335   MCV 95.8 03/06/2015 0335   NEUTROABS 5.0 03/06/2015 0335   LYMPHSABS 0.3* 03/06/2015 0335   MONOABS 1.5* 03/06/2015 0335   EOSABS 0.0 03/06/2015 0335   BASOSABS 0.0 03/06/2015 0335   Comprehensive Metabolic  Panel:    Component Value Date/Time   NA 121* 03/06/2015 0335   K 3.5 03/06/2015 0335   CL 85* 03/06/2015 0335   CO2 22 03/06/2015 0335   BUN 44* 03/06/2015 0335   CREATININE 3.20* 03/06/2015 0335   GLUCOSE 78 03/06/2015 0335   CALCIUM 8.4* 03/06/2015 0335   AST 32 03/06/2015 0335   ALT 18 03/06/2015 0335   ALKPHOS 54 03/06/2015 0335   BILITOT >50.0* 03/06/2015 0335   PROT 5.6* 03/06/2015 0335   ALBUMIN 2.7* 03/06/2015 0335     Time In: 1105 Time Out: 1145 Time Total: 40 Greater than 50%  of this time was spent counseling and coordinating care related to the above assessment and plan.  Signed by: Micheline Rough, MD  Micheline Rough, MD  03/06/2015,  11:58 AM  Please contact Palliative Medicine Team phone at [phone] for questions and concerns.

## 2015-03-06 NOTE — Progress Notes (Signed)
UR Completed. Oran Dillenburg, RN, BSN.  [phone] 

## 2015-03-06 NOTE — Progress Notes (Signed)
Chaplain presented to the 68M Unit for spiritual care visitation, and to complete a spiritual care consult for this patient.  Upon entering the patient's room Chaplain introduction was made to the patient and those present, which included her mother, aunt and uncle.  The family has a strong faith system, and shares their believe in the power of prayer for healing, and shares that faith freely with the patient. The patient's conversation was limited but it was apparent that she was in agreement with spiritual support for her  based on her responses to her family,  Chaplain offered a prayer on behalf of the patient.  Chaplain follow up will continue as needed. Chaplain Janell Quiet [phone]

## 2015-03-07 DIAGNOSIS — R1084 Generalized abdominal pain: Secondary | ICD-10-CM

## 2015-03-07 DIAGNOSIS — K767 Hepatorenal syndrome: Secondary | ICD-10-CM | POA: Diagnosis present

## 2015-03-07 LAB — COMPREHENSIVE METABOLIC PANEL
ALT: 23 U/L (ref 14–54)
AST: 36 U/L (ref 15–41)
Albumin: 2.9 g/dL — ABNORMAL LOW (ref 3.5–5.0)
Alkaline Phosphatase: 35 U/L — ABNORMAL LOW (ref 38–126)
Anion gap: 18 — ABNORMAL HIGH (ref 5–15)
BUN: 51 mg/dL — AB (ref 6–20)
CHLORIDE: 91 mmol/L — AB (ref 101–111)
CO2: 15 mmol/L — ABNORMAL LOW (ref 22–32)
CREATININE: 3.43 mg/dL — AB (ref 0.44–1.00)
Calcium: 8.5 mg/dL — ABNORMAL LOW (ref 8.9–10.3)
GFR, EST AFRICAN AMERICAN: 20 mL/min — AB (ref >60)
GFR, EST NON AFRICAN AMERICAN: 18 mL/min — AB (ref >60)
Glucose, Bld: 137 mg/dL — ABNORMAL HIGH (ref 65–99)
POTASSIUM: 4 mmol/L (ref 3.5–5.1)
Sodium: 124 mmol/L — ABNORMAL LOW (ref 135–145)
TOTAL PROTEIN: 5.6 g/dL — AB (ref 6.5–8.1)

## 2015-03-07 LAB — URINE CULTURE

## 2015-03-07 LAB — GRAM STAIN

## 2015-03-07 LAB — CBC
HEMATOCRIT: 24.7 % — AB (ref 36.0–46.0)
Hemoglobin: 8.5 g/dL — ABNORMAL LOW (ref 12.0–15.0)
MCH: 33.6 pg (ref 26.0–34.0)
MCHC: 34.4 g/dL (ref 30.0–36.0)
MCV: 97.6 fL (ref 78.0–100.0)
PLATELETS: 76 10*3/uL — AB (ref 150–400)
RBC: 2.53 MIL/uL — ABNORMAL LOW (ref 3.87–5.11)
RDW: 18.9 % — AB (ref 11.5–15.5)
WBC: 5.5 10*3/uL (ref 4.0–10.5)

## 2015-03-07 LAB — PATHOLOGIST SMEAR REVIEW

## 2015-03-07 LAB — GLUCOSE, CAPILLARY
GLUCOSE-CAPILLARY: 134 mg/dL — AB (ref 65–99)
GLUCOSE-CAPILLARY: 170 mg/dL — AB (ref 65–99)
GLUCOSE-CAPILLARY: 206 mg/dL — AB (ref 65–99)
Glucose-Capillary: 108 mg/dL — ABNORMAL HIGH (ref 65–99)

## 2015-03-07 MED ORDER — HEPARIN SODIUM (PORCINE) 5000 UNIT/ML IJ SOLN
5000.0000 [IU] | Freq: Three times a day (TID) | INTRAMUSCULAR | Status: DC
  Administered 2015-03-07 – 2015-03-08 (×2): 5000 [IU] via SUBCUTANEOUS
  Filled 2015-03-07 (×5): qty 1

## 2015-03-07 MED ORDER — DEXTROSE 5 % IV SOLN
1.0000 g | INTRAVENOUS | Status: AC
  Administered 2015-03-07 – 2015-03-11 (×5): 1 g via INTRAVENOUS
  Filled 2015-03-07 (×6): qty 1

## 2015-03-07 NOTE — Progress Notes (Signed)
Consulted by Dr. Joseph Art to start heparin subcutaneously for DVT prophylaxis. Will restart heparin 5000 units subq every 8 hours. Will f/u CBC closely.    Pollyann Samples, PharmD, BCPS 03/07/2015, 10:35 PM Pager: [phone]

## 2015-03-07 NOTE — Progress Notes (Signed)
Palliative Care RN note: patient is in bed, awake. Dr Neale Burly at bedside to introduce Palliative Care RN. Melanie Conley denies any needs/discomfort, but she does ask for ice water. Plan daily follow up by Palliative Care RN until transfer or change in status.  Donn Pierini, RN, BSN, [facility] 03/07/2015 10:11 AM

## 2015-03-07 NOTE — Progress Notes (Signed)
TEAM 1 - Stepdown/ICU TEAM Progress Note  Melanie Conley JXB:[id number] DOB: 1990-05-06 DOA: 03/26/2015 PCP: Woodfin Ganja, MD  Admit HPI / Brief Narrative: 25 y.o. 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 Patient is typically followed by [facility] gastroenterology and was just discharged from that institution on 03/02/2015 following treatment for SBP. She initially did okay back at home until developing fever and severe abdominal pain earlier today. Her adoptive mother is at the bedside and assists with the history. Ms. Poliquin has had a difficult course recently with serial hospital admissions related to cirrhosis. Complications have included multiple courses of SBP, acute blood loss from esophageal varices, and hepatic encephalopathy. Per [facility] notes, she was apparently on the pediatric transplant list but was never able to be transitioned to the adult last due to an inability to pass the psychosocial screening. She has been managed with prophylactic Bactrim, rifaximin, lactulose, prednisone, Imuran, and ursodiol. She's had numerous banding procedures for her varices. There's been no recent hematemesis, melena, or hematochezia.  In ED, patient was found to have oral temperature 37.9 C, sinus tachycardia in the 110s, and blood pressure in the 100/50 range. Her abdomen was noted to be distended and tender, raising concern for SBP. Paracentesis was performed and fluid seent for analysis. She was started on empiric cefepime and vancomycinfor presumed SBP. Blood work returned notable for sodium of 119, BUN of 42, serum creatinine of 3.0, total bilirubin >50, and INR of 3.9. Serum creatinine was apparently 0.09 at time of discharge from [facility] on 03/02/2015. She had large volume paracentesis performed during that admission but it is unclear if albumin was used to minimize fluid shifts. Pain was controlled  with IV hydromorphone in the emergency department, the patient remained hemodynamically stable, and will be admitted to the stepdown unit for ongoing evaluation and management of end-stage liver disease with suspected SBP and concern for development of hepatorenal syndrome.   HPI/Subjective: 2/7 A/O 2 (unsure of when, why), evident patient is confused but pleasant. 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  - Has progressed to end-stage; pt not able to pass psychosocial screening to be placed on transplant list per [facility] notes  - Has worsened significantly since discharge from [facility] just 3 days prior  - MELD now 47, corresponding to estimated 3-mo survival of <5%; this could be improved if renal function recovers  - Has known esophageal varices with history of bleeding and multiple banding procedures; no hematemesis, melena or hematochezia now; BP cannot tolerate propranolol - Has history of hepatic encephalopathy, continuing lactulose and rifaximin  - Chronically hyponatremic, now down to 119; coagulopathy of liver dz with INR 3.9, thrombocytopenia  - Has ascites, recommended for weekly large-vol para by [facility]; LVP last wk at [facility]  -Patient will require hepatologist locally as she splits her time between [facility] and here  Acute kidney injury concerning for hepatorenal syndrome  - Discharged from  [facility] on 03/02/15 with SCr of 0.09, now 3.20  - Uncertain if this is AKI from SBP with sepsis, but more concerned for  development of hepatorenal syndrome  - Had lg-vol para at [facility], uncertain if albumin was given to minimize fluid shift as this could have precipitated hepatorenal  - 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 for now with midodrine 7.5 TID, octreotide infusion, and albumin 12.5 g QID  - Repeat chemistries in the am  - Avoid nephrotoxins where possible, avoid fluid shifts -Counseled mother that if kidney function does not improve in the next 24-48 hours will have to consult nephrology for HD   Oropharyngeal candidiasis  - Nystatin swish and swallow QID   Goals of care  -Extremely looooong meeting with mother and which I explained patient's Poor overall prognosis - Mother believe now beginning to understand severity of disease process but still not willing to consider Hospice    Code Status: FULL Family Communication: Mother present at time of exam Disposition Plan: Transfer [facility] when bed available    Consultants: Dr.Gene [facility]   Procedure/Significant Events: NA   Culture 2/5 urine insignificant growth 2/5 blood left/right arm NGTD 2/6 pleural fluid NGTD 2/6 MRSA by PCR negative   Antibiotics: Cefepime 2/5>> Vancomycin 2/5>>    DVT prophylaxis: Subcutaneous heparin   Devices    LINES / TUBES:      Continuous Infusions: . dextrose 5 % and 0.9% NaCl 30 mL/hr at 03/07/15 1041  . octreotide  (SANDOSTATIN)    IV infusion 50 mcg/hr (03/07/15 2200)    Objective: VITAL SIGNS: Temp: 98.5 F (36.9 C) (02/07 1114) Temp Source: Oral (02/07 1114) BP: 103/63 mmHg (02/07 2000) Pulse Rate: 80 (02/07 2000) SPO2; FIO2:   Intake/Output Summary (Last 24 hours) at 03/07/15 2229 Last data filed at 03/07/15 1900  Gross per 24 hour  Intake   2290 ml  Output      0 ml  Net   2290 ml     Exam: General: A/O 2 (unsure of when, why), evident patient is confused but pleasant. Follows all commands, cachectic No acute respiratory distress Eyes: Negative headache, negative  scleral hemorrhage, positive icterus ENT: Negative Runny nose, negative gingival bleeding, positive thrush Neck:  Negative scars, masses, torticollis, lymphadenopathy, JVD Lungs: Clear to auscultation bilaterally without wheezes or crackles Cardiovascular: Regular rate and rhythm without murmur gallop or rub normal S1 and S2 Abdomen: Positive abdominal pain to palpation, distended, positive soft, bowel sounds, no rebound, positive ascites, no appreciable mass Extremities: No significant cyanosis, clubbing, or edema bilateral lower extremities Psychiatric:  Negative depression, negative anxiety, negative fatigue, negative mania  Neurologic:  Cranial nerves II through XII intact, tongue/uvula midline, all extremities muscle strength 5/5, sensation intact throughout, positive intention tremors negative dysarthria, negative expressive aphasia, negative receptive aphasia.   Data Reviewed: Basic Metabolic Panel:  Recent Labs Lab 15-Mar-2015 2034 03/06/15 0335 03/07/15 0307  NA 119* 121* 124*  K 3.5 3.5 4.0  CL 82* 85* 91*  CO2 22 22 15*  GLUCOSE 118* 78 137*  BUN 42* 44* 51*  CREATININE 3.00* 3.20* 3.43*  CALCIUM 9.0 8.4* 8.5*   Liver Function Tests:  Recent Labs Lab 15-Mar-2015 2034 03/06/15 0335 03/07/15 0307  AST 48* 32 36  ALT 18 18 23   ALKPHOS 72 54 35*  BILITOT >50.0* >50.0* >50.0*  PROT 6.1* 5.6* 5.6*  ALBUMIN 3.3* 2.7* 2.9*    Recent Labs Lab 03/07/2015 2034  LIPASE 30   No results for input(s):  AMMONIA in the last 168 hours. CBC:  Recent Labs Lab 03/25/2015 2034 03/06/15 0335 03/07/15 0307  WBC 8.4 6.8 5.5  NEUTROABS  --  5.0  --   HGB 10.6* 8.9* 8.5*  HCT 29.6* 24.8* 24.7*  MCV 96.7 95.8 97.6  PLT 131* 109* 76*   Cardiac Enzymes: No results for input(s): CKTOTAL, CKMB, CKMBINDEX, TROPONINI in the last 168 hours. BNP (last 3 results)  Recent Labs  03/06/15 0335  BNP 17.4    ProBNP (last 3 results) No results for input(s): PROBNP in the last 8760  hours.  CBG:  Recent Labs Lab 03/06/15 1113 03/07/15 0003 03/07/15 0311 03/07/15 0844 03/07/15 1110  GLUCAP 70 206* 134* 108* 170*    Recent Results (from the past 240 hour(s))  Urine culture     Status: None   Collection Time: 03/08/2015  9:33 PM  Result Value Ref Range Status   Specimen Description URINE, CLEAN CATCH  Final   Special Requests NONE  Final   Culture 4,000 COLONIES/mL INSIGNIFICANT GROWTH  Final   Report Status 03/07/2015 FINAL  Final  Blood culture (routine x 2)     Status: None (Preliminary result)   Collection Time: 03/23/2015  9:45 PM  Result Value Ref Range Status   Specimen Description BLOOD LEFT ARM  Final   Special Requests BOTTLES DRAWN AEROBIC AND ANAEROBIC 5CC  Final   Culture NO GROWTH 1 DAY  Final   Report Status PENDING  Incomplete  Blood culture (routine x 2)     Status: None (Preliminary result)   Collection Time: 03/09/2015  9:50 PM  Result Value Ref Range Status   Specimen Description BLOOD RIGHT ARM  Final   Special Requests BOTTLES DRAWN AEROBIC AND ANAEROBIC 5CC  Final   Culture NO GROWTH 1 DAY  Final   Report Status PENDING  Incomplete  Rapid strep screen     Status: None   Collection Time: 03/27/2015 10:39 PM  Result Value Ref Range Status   Streptococcus, Group A Screen (Direct) NEGATIVE NEGATIVE Final    Comment: (NOTE) A Rapid Antigen test may result negative if the antigen level in the sample is below the detection level of this test. The FDA has not cleared this test as a stand-alone test therefore the rapid antigen negative result has reflexed to a Group A Strep culture.   Culture, group A strep     Status: None (Preliminary result)   Collection Time: 03/01/2015 10:39 PM  Result Value Ref Range Status   Specimen Description THROAT  Final   Special Requests Immunocompromised  Final   Culture CULTURE REINCUBATED FOR BETTER GROWTH  Final   Report Status PENDING  Incomplete  Culture, body fluid-bottle     Status: None (Preliminary  result)   Collection Time: 03/06/15  1:40 AM  Result Value Ref Range Status   Specimen Description FLUID PLEURAL  Final   Special Requests BOTTLES DRAWN AEROBIC AND ANAEROBIC 10CC ADD  Final   Culture PENDING  Incomplete   Report Status PENDING  Incomplete  Gram stain     Status: None   Collection Time: 03/06/15  1:40 AM  Result Value Ref Range Status   Specimen Description FLUID PLEURAL  Final   Special Requests NONE  Final   Gram Stain   Final    CYTOSPIN SMEAR WBC PRESENT, PREDOMINANTLY MONONUCLEAR NO ORGANISMS SEEN    Report Status 03/07/2015 FINAL  Final  MRSA PCR Screening     Status: None  Collection Time: 03/06/15  9:03 AM  Result Value Ref Range Status   MRSA by PCR NEGATIVE NEGATIVE Final    Comment:        The GeneXpert MRSA Assay (FDA approved for NASAL specimens only), is one component of a comprehensive MRSA colonization surveillance program. It is not intended to diagnose MRSA infection nor to guide or monitor treatment for MRSA infections.      Studies:  Recent x-ray studies have been reviewed in detail by the Attending Physician  Scheduled Meds:  Scheduled Meds: . albumin human  12.5 g Intravenous 4 times per day  . azaTHIOprine  25 mg Oral BID  . ceFEPime (MAXIPIME) IV  1 g Intravenous Q24H  . feeding supplement (ENSURE ENLIVE)  237 mL Oral BID BM  . lactulose  20 g Oral TID  . metoCLOPramide  5 mg Oral BID  . midodrine  7.5 mg Oral TID WC  . nystatin  5 mL Mouth/Throat QID  . pantoprazole (PROTONIX) IV  40 mg Intravenous Q12H  . predniSONE  5 mg Oral Q breakfast  . rifaximin  550 mg Oral BID  . sodium chloride flush  3 mL Intravenous Q12H  . sodium chloride flush  3 mL Intravenous Q12H  . ursodiol  300 mg Oral BID  . vancomycin  500 mg Intravenous Q48H    Time spent on care of this patient: 40 mins   WOODS, Roselind Messier , MD  [facility] Office  [phone] Pager - [phone]  On-Call/Text Page:      [email or web address]       password [email or web address]  If 7PM-7AM, please contact night-coverage [email or web address] Password [email or web address] 03/07/2015, 10:29 PM   LOS: 2 days   Care during the described time interval was provided by me .  I have reviewed this patient's available data, including medical history, events of note, physical examination, and all test results as part of my evaluation. I have personally reviewed and interpreted all radiology studies.   Carolyne Littles, MD [phone] Pager

## 2015-03-07 NOTE — Progress Notes (Signed)
Pharmacy Antibiotic Note Melanie Conley is a 25 y.o. female admitted on 03/26/2015 with generalized weakness, hepatic encephalopathy, hyponatremia in setting autoimmune hepatitis and AKI. Pharmacy has been consulted for Cefepime and vancomycin dosing for potential sepsis/SBP.  Plan: - Reduce cefepime to 1 gm IV q24h in setting of worsening renal function - Continue vancomycin to 500 mg IV q48hr - Monitor plans for transfer to [facility]  Temp (24hrs), Avg:98.6 F (37 C), Min:98.1 F (36.7 C), Max:99 F (37.2 C)   Recent Labs Lab 03/19/2015 2034 03/25/2015 2213 03/06/15 0032 03/06/15 0335 03/07/15 0307  WBC 8.4  --   --  6.8 5.5  CREATININE 3.00*  --   --  3.20* 3.43*  LATICACIDVEN  --  2.49* 1.22 1.0  --     Estimated Creatinine Clearance: 17.2 mL/min (by C-G formula based on Cr of 3.43).    Allergies  Allergen Reactions  . Tylenol [Acetaminophen]     Can not take because of Liver  . 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 (pt was in the hospital when this occurred - May 2016) Has patient had a PCN reaction causing immediate rash, facial/tongue/throat swelling, SOB or lightheadedness with hypotension: No Has patient had a PCN reaction causing severe rash involving mucus membranes or skin necrosis: No Has patient had a PCN reaction that required hospitalization Yes Has patient had a PCN reaction occurring within the last 10 years: Yes If all of the above answers are "NO", then moay proceed with Ceph    Antimicrobials this admission: 2/5 vancomycin  >> 2/5 Cefepime  >>   Dose adjustments this admission: n/a  Microbiology results: MRSA PCR neg 2/5 BCx: sent 2/5 Urine: ngtd 2/6 Pleural fluid >>  Rapid strep neg Group A strep cx: ip  Thank you for allowing pharmacy to be a part of this patient's care.  Link Snuffer, PharmD, BCPS Clinical Pharmacist [phone]  03/07/2015 7:53 AM

## 2015-03-08 ENCOUNTER — Encounter (HOSPITAL_COMMUNITY): Payer: Self-pay | Admitting: General Practice

## 2015-03-08 LAB — BASIC METABOLIC PANEL
Anion gap: 15 (ref 5–15)
BUN: 44 mg/dL — ABNORMAL HIGH (ref 6–20)
CHLORIDE: 102 mmol/L (ref 101–111)
CO2: 16 mmol/L — AB (ref 22–32)
CREATININE: 2.76 mg/dL — AB (ref 0.44–1.00)
Calcium: 9.4 mg/dL (ref 8.9–10.3)
GFR calc non Af Amer: 23 mL/min — ABNORMAL LOW (ref >60)
GFR, EST AFRICAN AMERICAN: 27 mL/min — AB (ref >60)
GLUCOSE: 123 mg/dL — AB (ref 65–99)
Potassium: 3.4 mmol/L — ABNORMAL LOW (ref 3.5–5.1)
Sodium: 133 mmol/L — ABNORMAL LOW (ref 135–145)

## 2015-03-08 LAB — CBC WITH DIFFERENTIAL/PLATELET
BASOS ABS: 0 10*3/uL (ref 0.0–0.1)
Basophils Relative: 0 %
Eosinophils Absolute: 0 10*3/uL (ref 0.0–0.7)
Eosinophils Relative: 1 %
HEMATOCRIT: 21.8 % — AB (ref 36.0–46.0)
Hemoglobin: 7.5 g/dL — ABNORMAL LOW (ref 12.0–15.0)
LYMPHS ABS: 0.2 10*3/uL — AB (ref 0.7–4.0)
LYMPHS PCT: 6 %
MCH: 33.2 pg (ref 26.0–34.0)
MCHC: 34.4 g/dL (ref 30.0–36.0)
MCV: 96.5 fL (ref 78.0–100.0)
MONO ABS: 0.6 10*3/uL (ref 0.1–1.0)
Monocytes Relative: 15 %
NEUTROS ABS: 2.8 10*3/uL (ref 1.7–7.7)
Neutrophils Relative %: 77 %
Platelets: 70 10*3/uL — ABNORMAL LOW (ref 150–400)
RBC: 2.26 MIL/uL — AB (ref 3.87–5.11)
RDW: 18.5 % — AB (ref 11.5–15.5)
WBC: 3.6 10*3/uL — ABNORMAL LOW (ref 4.0–10.5)

## 2015-03-08 LAB — COMPREHENSIVE METABOLIC PANEL
ALT: 19 U/L (ref 14–54)
AST: 37 U/L (ref 15–41)
Albumin: 3.4 g/dL — ABNORMAL LOW (ref 3.5–5.0)
Alkaline Phosphatase: 29 U/L — ABNORMAL LOW (ref 38–126)
Anion gap: 14 (ref 5–15)
BUN: 47 mg/dL — AB (ref 6–20)
CO2: 20 mmol/L — ABNORMAL LOW (ref 22–32)
CREATININE: 3.16 mg/dL — AB (ref 0.44–1.00)
Calcium: 8.9 mg/dL (ref 8.9–10.3)
Chloride: 97 mmol/L — ABNORMAL LOW (ref 101–111)
GFR calc Af Amer: 23 mL/min — ABNORMAL LOW (ref >60)
GFR, EST NON AFRICAN AMERICAN: 19 mL/min — AB (ref >60)
Glucose, Bld: 124 mg/dL — ABNORMAL HIGH (ref 65–99)
Potassium: 2.6 mmol/L — CL (ref 3.5–5.1)
Sodium: 131 mmol/L — ABNORMAL LOW (ref 135–145)
TOTAL PROTEIN: 5.6 g/dL — AB (ref 6.5–8.1)

## 2015-03-08 LAB — CULTURE, GROUP A STREP (THRC)

## 2015-03-08 LAB — PHOSPHORUS: Phosphorus: 5.4 mg/dL — ABNORMAL HIGH (ref 2.5–4.6)

## 2015-03-08 LAB — MAGNESIUM: MAGNESIUM: 2.4 mg/dL (ref 1.7–2.4)

## 2015-03-08 LAB — GLUCOSE, CAPILLARY: Glucose-Capillary: 119 mg/dL — ABNORMAL HIGH (ref 65–99)

## 2015-03-08 LAB — AMMONIA: AMMONIA: 131 umol/L — AB (ref 9–35)

## 2015-03-08 MED ORDER — POTASSIUM CHLORIDE 10 MEQ/100ML IV SOLN
INTRAVENOUS | Status: AC
  Filled 2015-03-08: qty 100

## 2015-03-08 MED ORDER — POTASSIUM CHLORIDE CRYS ER 10 MEQ PO TBCR
30.0000 meq | EXTENDED_RELEASE_TABLET | ORAL | Status: DC
  Filled 2015-03-08 (×2): qty 1

## 2015-03-08 MED ORDER — POTASSIUM CHLORIDE 10 MEQ/100ML IV SOLN
10.0000 meq | INTRAVENOUS | Status: AC
  Administered 2015-03-08 (×3): 10 meq via INTRAVENOUS
  Filled 2015-03-08 (×3): qty 100

## 2015-03-08 MED ORDER — HYDROCORTISONE NA SUCCINATE PF 100 MG IJ SOLR
50.0000 mg | Freq: Every day | INTRAMUSCULAR | Status: DC
  Administered 2015-03-08 – 2015-03-13 (×6): 50 mg via INTRAVENOUS
  Filled 2015-03-08 (×6): qty 2

## 2015-03-08 MED ORDER — LACTULOSE ENEMA
300.0000 mL | Freq: Two times a day (BID) | ORAL | Status: DC
  Administered 2015-03-08 (×2): 300 mL via RECTAL
  Filled 2015-03-08 (×7): qty 300

## 2015-03-08 MED ORDER — POTASSIUM CHLORIDE 10 MEQ/100ML IV SOLN
10.0000 meq | INTRAVENOUS | Status: AC
  Administered 2015-03-08 (×6): 10 meq via INTRAVENOUS
  Filled 2015-03-08 (×5): qty 100

## 2015-03-08 MED ORDER — HYDROMORPHONE HCL 1 MG/ML IJ SOLN
0.5000 mg | INTRAMUSCULAR | Status: DC | PRN
  Administered 2015-03-10 – 2015-03-12 (×4): 0.5 mg via INTRAVENOUS
  Filled 2015-03-08 (×4): qty 1

## 2015-03-08 NOTE — Progress Notes (Signed)
CRITICAL VALUE ALERT  Critical value received: K 2.6  Date of notification:  03/08/15    Time of notification:  0345  Critical value read back: yes  Nurse who received alert:  Vilinda Blanks, RN  MD notified (1st page):  Dr Craige Cotta  Time of first page:    MD notified (2nd page):  Time of second page:  Responding MD:  Dr Craige Cotta  Time MD responded:  [phone]

## 2015-03-08 NOTE — Progress Notes (Signed)
TEAM 1 - Stepdown/ICU TEAM PROGRESS NOTE  Melanie Conley BJY:[id number] DOB: [birth year] DOA: 03/28/2015 PCP: Woodfin Ganja, MD  Admit HPI / Brief Narrative: 25 y.o. 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 Patient is followed by [facility] gastroenterology and was discharged from that institution on 03/02/2015 following treatment for SBP. She initially did okay back at home until developing fever and severe abdominal pain acutely on 03/22/2015. Her adoptive mother states Ms. Postema has had a difficult course recently with serial hospital admissions related to cirrhosis. Complications have included multiple courses of SBP, acute blood loss from esophageal varices, and hepatic encephalopathy. Per [facility] notes, she was apparently on the pediatric transplant list but was never able to be transitioned to the adult last due to an inability to pass the psychosocial screening. She has been managed with prophylactic Bactrim, rifaximin, lactulose, prednisone, Imuran, and ursodiol. She's had numerous banding procedures for her varices. There's been no recent hematemesis, melena, or hematochezia.  In ED, patient was found to have oral temperature 37.9 C, sinus tachycardia in the 110s, and blood pressure in the 100/50 range. Her abdomen was noted to be distended and tender. Paracentesis was performed. She was started on empiric cefepime and vancomycin for presumed SBP. Blood work returned notable for sodium of 119, BUN of 42, serum creatinine of 3.0, total bilirubin >50, and INR of 3.9. Creatinine was 0.09 at time of discharge from [facility] on 03/02/2015. She had large volume paracentesis performed during that admission. 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   HPI/Subjective: Pt is experiencing nearly constant  twitching of her mouth, and blinking type twitching of her eyes.  This twitching stops during painful stimuli.  It is not clear if it is voluntary, or involuntary.  She is not able to provide a hx, and does not respond to the examiner in any way except to grimace w/ pain.    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  - Has progressed to end-stage; pt not able to pass psychosocial screening to be placed on transplant list per [facility] notes  - Has worsened significantly since discharge from [facility] just 3 days prior  - MELD now 47, corresponding to estimated 3-mo survival of <5%; this could improve if renal function recovers  - Has known esophageal varices with history of bleeding and multiple banding procedures; no hematemesis, melena or hematochezia now; BP cannot tolerate propranolol - Has ascites, recommended for weekly large-vol para by [facility]    Hepatic encephalopathy  - ammonia 131 - cont lactulose and rifaximin   Severe chronic Hyponatremia  - Na nadir of 119 this admit - improving at acceptable rate    Coagulopathy of liver dz w/ thrombocytopenia  - INR 3.9 - no evidence of spontaneous blood loss - follow   Acute kidney injury concerning for hepatorenal syndrome  - Discharged from [facility] on 03/02/15 with SCr of  0.09 - 3.20 at time of this admit w/ peak at 3.43 thus far - treat as HRS for now with midodrine 7.5 TID, octreotide infusion, and albumin 12.5 g QID  - Avoid nephrotoxins where possible, avoid fluid shifts  Severe Hypokalemia  - replace and follow  Hyperglycemia  - no reported hx of DM -  check A1c - follow   Normocytic anemia  - no evidence of acute blood loss - suspect "anemia of chronic disease" compounded by acute kidney injury -   Twitching - appears to be voluntary - Ca2+ is within normal range - K+ being replaced - follow   Oropharyngeal candidiasis  - Nystatin swish and swallow QID   Goals of care  - Dr. Joseph Art participated in and extremely long meeting with the pt's mother on 03/07/15 during which he explained patient's very poor overall prognosis - Mother not presently willing to consider Hospice  Code Status: FULL Family Communication: no family present at time of exam Disposition Plan: transfer to [facility] when bed available   Consultants: Palliative Care   Procedures: Diagnostic paracentesis - 2/5   Antibiotics: Cefepime 2/5 > Vancomycin 2/5 >   DVT prophylaxis: SCDs  Objective: Blood pressure 93/64, pulse 89, temperature 98 F (36.7 C), temperature source Oral, resp. rate 19, height  (1.499 m), weight 47.2 kg (104 lb 0.9 oz), SpO2 100 %.  Intake/Output Summary (Last 24 hours) at 03/08/15 0857 Last data filed at 03/08/15 0800  Gross per 24 hour  Intake   2405 ml  Output      0 ml  Net   2405 ml    Exam: General: No acute respiratory distress evident  Lungs: Clear to auscultation bilaterally without wheezes or crackles Cardiovascular: Regular rate and rhythm - no rub or gallup  Abdomen: modestly distended - no rebound - tender to palpation th/o w/ grimace - bs + - no mass  Extremities: No significant cyanosis, clubbing, or edema bilateral lower extremities - cachectic   Data Reviewed:  Basic Metabolic Panel:  Recent Labs Lab 15-Mar-2015 2034 03/06/15 0335 03/07/15 0307 03/08/15 0250  NA 119* 121* 124* 131*  K 3.5 3.5 4.0 2.6*  CL 82* 85* 91* 97*  CO2 22 22 15* 20*  GLUCOSE 118* 78 137* 124*  BUN 42* 44* 51* 47*  CREATININE 3.00* 3.20* 3.43* 3.16*  CALCIUM 9.0 8.4* 8.5* 8.9  MG  --   --   --  2.4  PHOS  --   --   --  5.4*      CBC:  Recent Labs Lab 03/02/2015 2034 03/06/15 0335 03/07/15 0307 03/08/15 0250  WBC 8.4 6.8 5.5 3.6*  NEUTROABS  --  5.0  --  2.8  HGB 10.6* 8.9* 8.5* 7.5*  HCT 29.6* 24.8* 24.7* 21.8*  MCV 96.7 95.8 97.6 96.5  PLT 131* 109* 76* 70*    Liver Function Tests:  Recent Labs Lab 03/03/2015 2034 03/06/15 0335 03/07/15 0307 03/08/15 0250  AST 48* 32 36 37  ALT ALKPHOS 72 54 35* 29*  BILITOT >50.0* >50.0* >50.0* >50.0*  PROT 6.1* 5.6* 5.6* 5.6*  ALBUMIN 3.3* 2.7* 2.9* 3.4*    Recent Labs Lab March 15, 2015 2034  LIPASE 30    Recent Labs Lab 03/08/15 0245  AMMONIA 131*    Coags:  Recent Labs Lab 15-Mar-2015 2145  INR 3.94*   CBG:  Recent Labs Lab 03/07/15 0003 03/07/15 0311 03/07/15 0844 03/07/15 1110 03/08/15 0834  GLUCAP 206* 134*  108* 170* 119*    Recent Results (from the past 240 hour(s))  Urine culture     Status: None   Collection Time: 03/19/2015  9:33 PM  Result Value Ref Range Status   Specimen Description URINE, CLEAN CATCH  Final   Special Requests NONE  Final   Culture 4,000 COLONIES/mL INSIGNIFICANT GROWTH  Final   Report Status 03/07/2015 FINAL  Final  Blood culture (routine x 2)     Status: None (Preliminary result)   Collection Time: 03/04/2015  9:45 PM  Result Value Ref Range Status   Specimen Description BLOOD LEFT ARM  Final   Special Requests BOTTLES DRAWN AEROBIC AND ANAEROBIC 5CC  Final   Culture NO GROWTH 1 DAY  Final   Report Status PENDING  Incomplete  Blood culture (routine x 2)     Status: None (Preliminary result)   Collection Time: 03/28/2015  9:50 PM  Result Value Ref Range Status   Specimen Description BLOOD RIGHT ARM  Final   Special Requests BOTTLES DRAWN AEROBIC AND ANAEROBIC 5CC  Final   Culture NO GROWTH 1 DAY  Final   Report Status PENDING  Incomplete  Rapid strep screen     Status: None   Collection Time: 03/04/2015 10:39 PM  Result Value Ref Range Status   Streptococcus, Group A Screen (Direct)  NEGATIVE NEGATIVE Final    Comment: (NOTE) A Rapid Antigen test may result negative if the antigen level in the sample is below the detection level of this test. The FDA has not cleared this test as a stand-alone test therefore the rapid antigen negative result has reflexed to a Group A Strep culture.   Culture, group A strep     Status: None (Preliminary result)   Collection Time: 03/14/2015 10:39 PM  Result Value Ref Range Status   Specimen Description THROAT  Final   Special Requests Immunocompromised  Final   Culture CULTURE REINCUBATED FOR BETTER GROWTH  Final   Report Status PENDING  Incomplete  Culture, body fluid-bottle     Status: None (Preliminary result)   Collection Time: 03/06/15  1:40 AM  Result Value Ref Range Status   Specimen Description FLUID PLEURAL  Final   Special Requests BOTTLES DRAWN AEROBIC AND ANAEROBIC 10CC ADD  Final   Culture PENDING  Incomplete   Report Status PENDING  Incomplete  Gram stain     Status: None   Collection Time: 03/06/15  1:40 AM  Result Value Ref Range Status   Specimen Description FLUID PLEURAL  Final   Special Requests NONE  Final   Gram Stain   Final    CYTOSPIN SMEAR WBC PRESENT, PREDOMINANTLY MONONUCLEAR NO ORGANISMS SEEN    Report Status 03/07/2015 FINAL  Final  MRSA PCR Screening     Status: None   Collection Time: 03/06/15  9:03 AM  Result Value Ref Range Status   MRSA by PCR NEGATIVE NEGATIVE Final    Comment:        The GeneXpert MRSA Assay (FDA approved for NASAL specimens only), is one component of a comprehensive MRSA colonization surveillance program. It is not intended to diagnose MRSA infection nor to guide or monitor treatment for MRSA infections.      Studies:   Recent x-ray studies have been reviewed in detail by the Attending Physician  Scheduled Meds:  Scheduled Meds: . albumin human  12.5 g Intravenous 4 times per day  . azaTHIOprine  25 mg Oral BID  . ceFEPime (MAXIPIME) IV  1 g Intravenous  Q24H  . feeding supplement (ENSURE ENLIVE)  237 mL Oral BID BM  . heparin subcutaneous  5,000 Units Subcutaneous 3 times per day  . lactulose  20 g Oral TID  . metoCLOPramide  5 mg Oral BID  . midodrine  7.5 mg Oral TID WC  . nystatin  5 mL Mouth/Throat QID  . pantoprazole (PROTONIX) IV  40 mg Intravenous Q12H  . potassium chloride  10 mEq Intravenous Q1 Hr x 6  . potassium chloride      . predniSONE  5 mg Oral Q breakfast  . rifaximin  550 mg Oral BID  . sodium chloride flush  3 mL Intravenous Q12H  . sodium chloride flush  3 mL Intravenous Q12H  . ursodiol  300 mg Oral BID  . vancomycin  500 mg Intravenous Q48H    Time spent on care of this patient: 35 mins   Chanci Ojala T , MD   [facility] Office  [phone] Pager - Text Page per Loretha Stapler as per below:  On-Call/Text Page:      [email or web address]      password [email or web address]  If 7PM-7AM, please contact night-coverage [email or web address] Password [email or web address] 03/08/2015, 8:57 AM   LOS: 3 days

## 2015-03-08 NOTE — Progress Notes (Signed)
Dr Craige Cotta made aware of pt's frequent muscle twitching, potassium infusing per order, will continue to monitor.

## 2015-03-08 NOTE — Progress Notes (Signed)
Palliative Care RN Note: Daily f/u per request from Dr Neale Burly. Pt opens her eyes today, but she will not answer questions. Her nurse reports that meds have been converted to alternate routes d/t pt refusing/not able to swallow. Pt remains full code while pending transfer. Plan f/u tomorrow.  Donn Pierini, RN, BSN, [facility] 03/08/2015 3:32 PM

## 2015-03-09 LAB — CBC WITH DIFFERENTIAL/PLATELET
BASOS ABS: 0 10*3/uL (ref 0.0–0.1)
BASOS PCT: 0 %
EOS ABS: 0 10*3/uL (ref 0.0–0.7)
Eosinophils Relative: 0 %
HEMATOCRIT: 20.2 % — AB (ref 36.0–46.0)
HEMOGLOBIN: 7 g/dL — AB (ref 12.0–15.0)
Lymphocytes Relative: 8 %
Lymphs Abs: 0.2 10*3/uL — ABNORMAL LOW (ref 0.7–4.0)
MCH: 34.1 pg — ABNORMAL HIGH (ref 26.0–34.0)
MCHC: 34.7 g/dL (ref 30.0–36.0)
MCV: 98.5 fL (ref 78.0–100.0)
Monocytes Absolute: 0.2 10*3/uL (ref 0.1–1.0)
Monocytes Relative: 10 %
NEUTROS ABS: 2 10*3/uL (ref 1.7–7.7)
NEUTROS PCT: 82 %
Platelets: 63 10*3/uL — ABNORMAL LOW (ref 150–400)
RBC: 2.05 MIL/uL — AB (ref 3.87–5.11)
RDW: 18.6 % — ABNORMAL HIGH (ref 11.5–15.5)
WBC: 2.4 10*3/uL — AB (ref 4.0–10.5)

## 2015-03-09 LAB — GLUCOSE, CAPILLARY
GLUCOSE-CAPILLARY: 127 mg/dL — AB (ref 65–99)
GLUCOSE-CAPILLARY: 139 mg/dL — AB (ref 65–99)
GLUCOSE-CAPILLARY: 154 mg/dL — AB (ref 65–99)
Glucose-Capillary: 128 mg/dL — ABNORMAL HIGH (ref 65–99)

## 2015-03-09 LAB — COMPREHENSIVE METABOLIC PANEL
ALBUMIN: 3.6 g/dL (ref 3.5–5.0)
ALK PHOS: 23 U/L — AB (ref 38–126)
ALT: 28 U/L (ref 14–54)
ANION GAP: 15 (ref 5–15)
AST: 42 U/L — AB (ref 15–41)
BUN: 42 mg/dL — AB (ref 6–20)
CALCIUM: 9.4 mg/dL (ref 8.9–10.3)
CO2: 18 mmol/L — AB (ref 22–32)
Chloride: 101 mmol/L (ref 101–111)
Creatinine, Ser: 2.58 mg/dL — ABNORMAL HIGH (ref 0.44–1.00)
GFR calc Af Amer: 29 mL/min — ABNORMAL LOW (ref >60)
GFR calc non Af Amer: 25 mL/min — ABNORMAL LOW (ref >60)
GLUCOSE: 131 mg/dL — AB (ref 65–99)
POTASSIUM: 3.5 mmol/L (ref 3.5–5.1)
SODIUM: 134 mmol/L — AB (ref 135–145)
TOTAL PROTEIN: 5.7 g/dL — AB (ref 6.5–8.1)

## 2015-03-09 LAB — AMMONIA: AMMONIA: 60 umol/L — AB (ref 9–35)

## 2015-03-09 LAB — PHOSPHORUS: Phosphorus: 4.3 mg/dL (ref 2.5–4.6)

## 2015-03-09 LAB — MAGNESIUM: Magnesium: 2.1 mg/dL (ref 1.7–2.4)

## 2015-03-09 MED ORDER — LACTULOSE ENEMA
300.0000 mL | Freq: Three times a day (TID) | ORAL | Status: DC
  Administered 2015-03-09: 300 mL via RECTAL
  Filled 2015-03-09 (×3): qty 300

## 2015-03-09 MED ORDER — LACTULOSE 10 GM/15ML PO SOLN
30.0000 g | Freq: Three times a day (TID) | ORAL | Status: DC
  Administered 2015-03-09 – 2015-03-11 (×6): 30 g via ORAL
  Filled 2015-03-09 (×6): qty 45

## 2015-03-09 MED ORDER — SODIUM CHLORIDE 0.9 % IV SOLN
Freq: Once | INTRAVENOUS | Status: AC
  Administered 2015-03-09: 08:00:00 via INTRAVENOUS

## 2015-03-09 NOTE — Progress Notes (Signed)
Nutrition Follow-up  DOCUMENTATION CODES:   Underweight, Severe malnutrition in context of chronic illness  INTERVENTION:   -If pt/family continues to desire aggressive treatment and pt not alert enough to take po's, recommend:  Initiate Vital 1.5 @ 20 ml/hr via NGT and increase by 10 ml every 12 hours to goal rate of 45 ml/hr.   Tube feeding regimen provides 1620 kcal (101% of needs), 73 grams of protein, and 825 ml of H2O.   NUTRITION DIAGNOSIS:   Malnutrition related to chronic illness as evidenced by severe depletion of muscle mass, severe depletion of body fat, percent weight loss (12% weight loss within the past month).  Ongoing  GOAL:   Patient will meet greater than or equal to 90% of their needs  Unmet  MONITOR:   PO intake, Labs, Weight trends, Supplement acceptance  REASON FOR ASSESSMENT:   Consult Assessment of nutrition requirement/status  ASSESSMENT:   25 y.o. female with PMH of autoimmune hepatitis and primary sclerosing cholangitis with cirrhosis who presents to the ED with 1 day of fever and abdominal pain. Patient is typically followed by [facility] gastroenterology and was just discharged from that institution on 03/02/2015 following treatment for SBP. She initially did okay back at home until developing fever and severe abdominal pain earlier today.  Pt very lethargic at time of visit; unable to hold conversation with this RD. Noted upper extremity tremors.   Pt is now NPO, due to lethargy.   Palliative care team following; continuing to discuss goals of care with family. Family not willing to consider hospice at this time, per MD.   Case discussed with RN. She confirms lethargy and poor prognosis. She reveals that plan is to transfer to [facility] once bed is available.   Labs reviewed: Na: 134 (on IV replacement), CBGS: 127-154.   Diet Order:  Diet NPO time specified Except for: Sips with Meds  Skin:  Reviewed, no issues  Last BM:  03/09/15  Height:   Ht  Readings from Last 1 Encounters:  03/06/15  (1.499 m)    Weight:   Wt Readings from Last 1 Encounters:  03/09/15 107 lb 2.3 oz (48.6 kg)    Ideal Body Weight:  44.5 kg  BMI:  Body mass index is 21.63 kg/(m^2).  Estimated Nutritional Needs:   Kcal:  1400-1600  Protein:  70-85 gm  Fluid:  1.4-1.6 L  EDUCATION NEEDS:   No education needs identified at this time  Sanye Ledesma A. Mayford Knife, RD, LDN, CDE Pager: [phone] After hours Pager: [phone]

## 2015-03-09 NOTE — Progress Notes (Signed)
[place] TEAM 1 - Stepdown/ICU TEAM Progress Note  Melanie [facility] ZOX:[id number] DOB: [birth year] DOA: 03/26/2015 PCP: Melanie Ganja, MD  Admit HPI / Brief Narrative: 25 y.o. 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 Patient is typically followed by [facility] and was just discharged from that institution on 03/02/2015 following treatment for SBP. She initially did okay back at home until developing fever and severe abdominal pain earlier today. Her adoptive mother is at the bedside and assists with the history. Melanie Conley has had a difficult course recently with serial [facility] admissions related to cirrhosis. Complications have included multiple courses of SBP, acute blood loss from esophageal varices, and hepatic encephalopathy. Per [facility] notes, she was apparently on the pediatric transplant list but was never able to be transitioned to the adult last due to an inability to pass the psychosocial screening. She has been managed with prophylactic Bactrim, rifaximin, lactulose, prednisone, Imuran, and ursodiol. She's had numerous banding procedures for her varices. There's been no recent hematemesis, melena, or hematochezia.  In ED, patient was found to have oral temperature 37.9 C, sinus tachycardia in the 110s, and blood pressure in the 100/50 range. Her abdomen was noted to be distended and tender, raising concern for SBP. Paracentesis was performed and fluid seent for analysis. She was started on empiric cefepime and vancomycinfor presumed SBP. Blood work returned notable for sodium of 119, BUN of 42, serum creatinine of 3.0, total bilirubin >50, and INR of 3.9. Serum creatinine was apparently 0.09 at time of discharge from [facility] on 03/02/2015. She had large volume paracentesis performed during that admission but it is unclear if albumin was used to minimize fluid shifts. Pain was controlled  with IV hydromorphone in the emergency department, the patient remained hemodynamically stable, and will be admitted to the stepdown unit for ongoing evaluation and management of end-stage liver disease with suspected SBP and concern for development of hepatorenal syndrome.   HPI/Subjective: 2/9 A/O 1 (unsure of where, when, why), evident patient is confused but pleasant. 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  - Has progressed to end-stage; pt not able to pass psychosocial screening to be placed on transplant list per [facility] notes  - Has worsened significantly since discharge from [facility] just 3 days prior  - MELD now 47, corresponding to estimated 3-mo survival of <5%; this could be improved if renal function recovers  - Has known esophageal varices with history of bleeding and multiple banding procedures; no hematemesis, melena or hematochezia now; BP cannot tolerate propranolol - Has history of hepatic encephalopathy, continuing lactulose and rifaximin  - Chronically hyponatremic, now down to 119; coagulopathy of liver dz with INR 3.9, thrombocytopenia  - Has ascites, recommended for weekly large-vol para by [facility]; LVP last wk at [facility]  -Patient will require hepatologist locally as she splits her time between [facility] and here  Hepatic encephalopathy/altered mental status -Most likely  multifactorial to include elevated ammonia, uremia, anemia. - ammonia 131--> 60 -Increase  lactulose to 200 ml TID -Continue Rifaximin 550 mg BID  Severe chronic Hyponatremia  - Na nadir of 119 this admit - improving at acceptable rate   Coagulopathy of liver dz w/ thrombocytopenia  - INR 3.9 - no evidence of spontaneous blood loss -Patient's H/H trending down obtain occult blood -Transfuse for lower< 7  Acute kidney injury concerning for hepatorenal syndrome  - Discharged from [facility] on 03/02/15 with SCr of 0.09, now 3.20  - Uncertain if this is AKI from SBP with sepsis, but more concerned for development of hepatorenal syndrome  - Had lg-vol para at [facility], uncertain if albumin was given to minimize fluid shift as this could have precipitated hepatorenal  - 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  - no evidence of acute blood loss - suspect "anemia of chronic disease" compounded by acute kidney injury -  -Occult blood -Type and screen blood -Transfuse for hemoglobin<7   Oropharyngeal candidiasis  - Nystatin swish and swallow QID      Goals of care  -Extremely looooong meeting with mother and which I explained patient's Poor overall prognosis - Mother now beginning to understand severity of disease process but still not willing to consider Hospice    Code Status: FULL Family Communication: Mother present at time of exam Disposition Plan: Transfer [facility] when bed available    Consultants: [facility]   Procedure/Significant Events: NA   Culture 2/5 urine insignificant growth 2/5 blood left/right arm NGTD 2/6 pleural fluid NGTD 2/6 MRSA by PCR negative   Antibiotics: Cefepime 2/5>> Vancomycin  2/5>>    DVT prophylaxis: Subcutaneous heparin   Devices    LINES / TUBES:      Continuous Infusions: . dextrose 5 % and 0.9% NaCl 50 mL/hr at 03/08/15 0949  . octreotide  (SANDOSTATIN)    IV infusion 50 mcg/hr (03/09/15 0609)    Objective: VITAL SIGNS: Temp: 98.2 F (36.8 C) (02/09 0400) Temp Source: Oral (02/09 0400) BP: 144/96 mmHg (02/09 0600) SPO2; FIO2:   Intake/Output Summary (Last 24 hours) at 03/09/15 0741 Last data filed at 03/09/15 0600  Gross per 24 hour  Intake 2368.67 ml  Output      0 ml  Net 2368.67 ml     Exam: General: A/O 2 (unsure of when, why), evident patient is confused but pleasant. Follows all commands, cachectic No acute respiratory distress Eyes: Negative headache, negative scleral hemorrhage, positive icterus ENT: Negative Runny nose, negative gingival bleeding, positive thrush Neck:  Negative scars, masses, torticollis, lymphadenopathy, JVD Lungs: Clear to auscultation bilaterally without wheezes or crackles Cardiovascular: Regular rate and rhythm without murmur gallop or rub normal S1 and S2 Abdomen: Positive abdominal pain to palpation, increasing distended, positive soft, bowel sounds, no rebound, positive ascites, no appreciable mass Extremities: No significant cyanosis, clubbing, or edema bilateral lower extremities Psychiatric:  Negative depression, negative anxiety, negative fatigue, negative mania  Neurologic:  Cranial nerves II through XII intact, tongue/uvula midline, all extremities muscle strength 5/5, sensation intact throughout, positive intention tremors negative dysarthria, negative expressive aphasia, negative receptive  aphasia.   Data Reviewed: Basic Metabolic Panel:  Recent Labs Lab 03/06/15 0335 03/07/15 0307 03/08/15 0250 03/08/15 1501 03/09/15 0420  NA 121* 124* 131* 133* 134*  K 3.5 4.0 2.6* 3.4* 3.5  CL 85* 91* 97* 102 101  CO2 22 15* 20* 16* 18*  GLUCOSE 78 137* 124* 123* 131*  BUN 44* 51* 47*  44* 42*  CREATININE 3.20* 3.43* 3.16* 2.76* 2.58*  CALCIUM 8.4* 8.5* 8.9 9.4 9.4  MG  --   --  2.4  --  2.1  PHOS  --   --  5.4*  --  4.3   Liver Function Tests:  Recent Labs Lab 03/17/2015 2034 03/06/15 0335 03/07/15 0307 03/08/15 0250 03/09/15 0420  AST 48* 32 36 37 42*  ALT ALKPHOS 72 54 35* 29* 23*  BILITOT >50.0* >50.0* >50.0* >50.0* >50.0*  PROT 6.1* 5.6* 5.6* 5.6* 5.7*  ALBUMIN 3.3* 2.7* 2.9* 3.4* 3.6    Recent Labs Lab 03/22/2015 2034  LIPASE 30    Recent Labs Lab 03/08/15 0245 03/09/15 0420  AMMONIA 131* 60*   CBC:  Recent Labs Lab 03/14/2015 2034 03/06/15 0335 03/07/15 0307 03/08/15 0250 03/09/15 0420  WBC 8.4 6.8 5.5 3.6* 2.4*  NEUTROABS  --  5.0  --  2.8 2.0  HGB 10.6* 8.9* 8.5* 7.5* 7.0*  HCT 29.6* 24.8* 24.7* 21.8* 20.2*  MCV 96.7 95.8 97.6 96.5 98.5  PLT 131* 109* 76* 70* 63*   Cardiac Enzymes: No results for input(s): CKTOTAL, CKMB, CKMBINDEX, TROPONINI in the last 168 hours. BNP (last 3 results)  Recent Labs  03/06/15 0335  BNP 17.4    ProBNP (last 3 results) No results for input(s): PROBNP in the last 8760 hours.  CBG:  Recent Labs Lab 03/07/15 0844 03/07/15 1110 03/08/15 0834 03/09/15 03/09/15 0541  GLUCAP 108* 170* 119* 139* 127*    Recent Results (from the past 240 hour(s))  Urine culture     Status: None   Collection Time: 03/14/2015  9:33 PM  Result Value Ref Range Status   Specimen Description URINE, CLEAN CATCH  Final   Special Requests NONE  Final   Culture 4,000 COLONIES/mL INSIGNIFICANT GROWTH  Final   Report Status 03/07/2015 FINAL  Final  Blood culture (routine x 2)     Status: None (Preliminary result)   Collection Time: 03/12/2015  9:45 PM  Result Value Ref Range Status   Specimen Description BLOOD LEFT ARM  Final   Special Requests BOTTLES DRAWN AEROBIC AND ANAEROBIC 5CC  Final   Culture NO GROWTH 2 DAYS  Final   Report Status PENDING  Incomplete  Blood culture (routine x 2)     Status:  None (Preliminary result)   Collection Time: 03/19/2015  9:50 PM  Result Value Ref Range Status   Specimen Description BLOOD RIGHT ARM  Final   Special Requests BOTTLES DRAWN AEROBIC AND ANAEROBIC 5CC  Final   Culture NO GROWTH 2 DAYS  Final   Report Status PENDING  Incomplete  Rapid strep screen     Status: None   Collection Time: 03/21/2015 10:39 PM  Result Value Ref Range Status   Streptococcus, Group A Screen (Direct) NEGATIVE NEGATIVE Final    Comment: (NOTE) A Rapid Antigen test may result negative if the antigen level in the sample is below the detection level of this test. The FDA has not cleared this test as a stand-alone test therefore the rapid antigen negative result has reflexed to  a Group A Strep culture.   Culture, group A strep     Status: None   Collection Time: 03/06/2015 10:39 PM  Result Value Ref Range Status   Specimen Description THROAT  Final   Special Requests Immunocompromised  Final   Culture NO GROUP A STREP (S.PYOGENES) ISOLATED  Final   Report Status 03/08/2015 FINAL  Final  Culture, body fluid-bottle     Status: None (Preliminary result)   Collection Time: 03/06/15  1:40 AM  Result Value Ref Range Status   Specimen Description FLUID PLEURAL  Final   Special Requests BOTTLES DRAWN AEROBIC AND ANAEROBIC 10CC ADD  Final   Culture NO GROWTH 1 DAY  Final   Report Status PENDING  Incomplete  Gram stain     Status: None   Collection Time: 03/06/15  1:40 AM  Result Value Ref Range Status   Specimen Description FLUID PLEURAL  Final   Special Requests NONE  Final   Gram Stain   Final    CYTOSPIN SMEAR WBC PRESENT, PREDOMINANTLY MONONUCLEAR NO ORGANISMS SEEN    Report Status 03/07/2015 FINAL  Final  MRSA PCR Screening     Status: None   Collection Time: 03/06/15  9:03 AM  Result Value Ref Range Status   MRSA by PCR NEGATIVE NEGATIVE Final    Comment:        The GeneXpert MRSA Assay (FDA approved for NASAL specimens only), is one component of  a comprehensive MRSA colonization surveillance program. It is not intended to diagnose MRSA infection nor to guide or monitor treatment for MRSA infections.      Studies:  Recent x-ray studies have been reviewed in detail by the Attending Physician  Scheduled Meds:  Scheduled Meds: . albumin human  12.5 g Intravenous 4 times per day  . azaTHIOprine  25 mg Oral BID  . ceFEPime (MAXIPIME) IV  1 g Intravenous Q24H  . hydrocortisone sod succinate (SOLU-CORTEF) inj  50 mg Intravenous Daily  . lactulose  300 mL Rectal BID  . midodrine  7.5 mg Oral TID WC  . nystatin  5 mL Mouth/Throat QID  . pantoprazole (PROTONIX) IV  40 mg Intravenous Q12H  . rifaximin  550 mg Oral BID  . sodium chloride flush  3 mL Intravenous Q12H  . ursodiol  300 mg Oral BID  . vancomycin  500 mg Intravenous Q48H    Time spent on care of this patient: 40 mins   WOODS, Roselind Messier , MD  [facility] Office  [phone] Pager - [phone]  On-Call/Text Page:      [email or web address]      password [email or web address]  If 7PM-7AM, please contact night-coverage [email or web address] Password [email or web address] 03/09/2015, 7:41 AM   LOS: 4 days   Care during the described time interval was provided by me .  I have reviewed this patient's available data, including medical history, events of note, physical examination, and all test results as part of my evaluation. I have personally reviewed and interpreted all radiology studies.   Carolyne Littles, MD [phone] Pager

## 2015-03-09 NOTE — Progress Notes (Signed)
Palliative Care RN note: Visited pt for daily follow up. She was watching TV, but when I walked into the room, she closed her eyes, and her face began twitching. She did not acknowledge me or answer any questions. Plan f/u again tomorrow by Palliative Care RN. Donn Pierini, RN, BSN, [facility] 03/09/2015 2:28 PM

## 2015-03-09 NOTE — Progress Notes (Signed)
MD made aware about the cbg  -154 at 12 noon, with no order.

## 2015-03-10 LAB — COMPREHENSIVE METABOLIC PANEL
ALK PHOS: 16 U/L — AB (ref 38–126)
ALT: 16 U/L (ref 14–54)
ANION GAP: 13 (ref 5–15)
AST: 40 U/L (ref 15–41)
Albumin: 3.9 g/dL (ref 3.5–5.0)
BUN: 43 mg/dL — ABNORMAL HIGH (ref 6–20)
CALCIUM: 10 mg/dL (ref 8.9–10.3)
CO2: 16 mmol/L — AB (ref 22–32)
Chloride: 112 mmol/L — ABNORMAL HIGH (ref 101–111)
Creatinine, Ser: 2.35 mg/dL — ABNORMAL HIGH (ref 0.44–1.00)
GFR calc non Af Amer: 28 mL/min — ABNORMAL LOW (ref >60)
GFR, EST AFRICAN AMERICAN: 32 mL/min — AB (ref >60)
Glucose, Bld: 141 mg/dL — ABNORMAL HIGH (ref 65–99)
Potassium: 3.4 mmol/L — ABNORMAL LOW (ref 3.5–5.1)
SODIUM: 141 mmol/L (ref 135–145)
TOTAL PROTEIN: 5.8 g/dL — AB (ref 6.5–8.1)

## 2015-03-10 LAB — CBC WITH DIFFERENTIAL/PLATELET
Basophils Absolute: 0 10*3/uL (ref 0.0–0.1)
Basophils Relative: 0 %
EOS ABS: 0 10*3/uL (ref 0.0–0.7)
Eosinophils Relative: 0 %
HCT: 18.6 % — ABNORMAL LOW (ref 36.0–46.0)
Hemoglobin: 6.3 g/dL — CL (ref 12.0–15.0)
LYMPHS ABS: 0.2 10*3/uL — AB (ref 0.7–4.0)
Lymphocytes Relative: 4 %
MCH: 34.1 pg — AB (ref 26.0–34.0)
MCHC: 33.9 g/dL (ref 30.0–36.0)
MCV: 100.5 fL — ABNORMAL HIGH (ref 78.0–100.0)
MONO ABS: 0.2 10*3/uL (ref 0.1–1.0)
MONOS PCT: 5 %
NEUTROS PCT: 91 %
Neutro Abs: 3.9 10*3/uL (ref 1.7–7.7)
Platelets: 69 10*3/uL — ABNORMAL LOW (ref 150–400)
RBC: 1.85 MIL/uL — ABNORMAL LOW (ref 3.87–5.11)
RDW: 19.1 % — AB (ref 11.5–15.5)
WBC: 4.3 10*3/uL (ref 4.0–10.5)

## 2015-03-10 LAB — MAGNESIUM: MAGNESIUM: 2.1 mg/dL (ref 1.7–2.4)

## 2015-03-10 LAB — HEMOGLOBIN A1C
Hgb A1c MFr Bld: 4.1 % — ABNORMAL LOW (ref 4.8–5.6)
Mean Plasma Glucose: 71 mg/dL

## 2015-03-10 LAB — PROTIME-INR
INR: 7.64 (ref 0.00–1.49)
Prothrombin Time: 61.8 seconds — ABNORMAL HIGH (ref 11.6–15.2)

## 2015-03-10 LAB — GLUCOSE, CAPILLARY
GLUCOSE-CAPILLARY: 129 mg/dL — AB (ref 65–99)
GLUCOSE-CAPILLARY: 139 mg/dL — AB (ref 65–99)
GLUCOSE-CAPILLARY: 140 mg/dL — AB (ref 65–99)
GLUCOSE-CAPILLARY: 150 mg/dL — AB (ref 65–99)
Glucose-Capillary: 128 mg/dL — ABNORMAL HIGH (ref 65–99)

## 2015-03-10 LAB — CBC
HEMATOCRIT: 25.8 % — AB (ref 36.0–46.0)
HEMOGLOBIN: 8.9 g/dL — AB (ref 12.0–15.0)
MCH: 33.5 pg (ref 26.0–34.0)
MCHC: 34.5 g/dL (ref 30.0–36.0)
MCV: 97 fL (ref 78.0–100.0)
Platelets: 60 10*3/uL — ABNORMAL LOW (ref 150–400)
RBC: 2.66 MIL/uL — AB (ref 3.87–5.11)
RDW: 20 % — ABNORMAL HIGH (ref 11.5–15.5)
WBC: 6.6 10*3/uL (ref 4.0–10.5)

## 2015-03-10 LAB — PHOSPHORUS: PHOSPHORUS: 4.3 mg/dL (ref 2.5–4.6)

## 2015-03-10 LAB — PREPARE RBC (CROSSMATCH)

## 2015-03-10 LAB — AMMONIA: Ammonia: 63 umol/L — ABNORMAL HIGH (ref 9–35)

## 2015-03-10 LAB — OCCULT BLOOD X 1 CARD TO LAB, STOOL: FECAL OCCULT BLD: POSITIVE — AB

## 2015-03-10 MED ORDER — HYDROMORPHONE HCL 1 MG/ML IJ SOLN
1.0000 mg | Freq: Once | INTRAMUSCULAR | Status: AC
  Administered 2015-03-10: 1 mg via INTRAVENOUS
  Filled 2015-03-10: qty 1

## 2015-03-10 MED ORDER — SODIUM CHLORIDE 0.9 % IV SOLN: Freq: Once | INTRAVENOUS | Status: DC

## 2015-03-10 NOTE — Progress Notes (Signed)
Patient having increased jerking/twitching movements throughout entire body. Patient will only say her name but nods and gestures appropriately. Patient denies any pain or discomfort at this time. Rama, MD aware. Will continue to monitor.

## 2015-03-10 NOTE — Progress Notes (Addendum)
CRITICAL VALUE ALERT  Critical value received:  Hemoglobin 6.3  Date of notification: 03/10/15  Time of notification:  0955  Critical value read back:Yes.    Nurse who received alert:  Hoyle Sauer, RN  MD notified (1st page):  Dr. Darnelle Catalan aware, orders received for Transfusion of 1 unit of PRBC's

## 2015-03-10 NOTE — Progress Notes (Addendum)
Progress Note   NOHEA KRAS ZOX:[id number] DOB: May 12, 1990 DOA: 03/24/2015 PCP: Woodfin Ganja, MD   Brief Narrative:   ARLETT GOOLD is an 25 y.o. female with a PMH of autoimmune hepatitis and primary sclerosing cholangitis with cirrhosiswho was admitted 03/07/2015 with a chief complaint of a 1 day history of of fever and abdominal pain. Patient is followed by [facility] gastroenterology and was discharged from that institution on 03/02/2015 following treatment for SBP. She initially did okay back at home until developing fever and severe abdominal pain acutely on 03/21/2015. Her adoptive mother states Ms. Rueth has had a difficult course recently with serial hospital admissions related to cirrhosis. Complications have included multiple courses of SBP, acute blood loss from esophageal varices, and hepatic encephalopathy. Per [facility] notes, she was apparently on the pediatric transplant list but was never able to be transitioned to the adult list due to an inability to pass the psychosocial screening. She has been managed with prophylactic Bactrim, rifaximin, lactulose, prednisone, Imuran, and ursodiol. She's had numerous banding procedures for her varices. There's been no recent hematemesis, melena, or hematochezia.  In ED, patient was found to have oral temperature 37.9 C, sinus tachycardia in the 110s, and blood pressure in the 100/50 range. Her abdomen was noted to be distended and tender. Paracentesis was performed. She was started on empiric cefepime and vancomycin for presumed SBP. Blood work returned notable for sodium of 119, BUN of 42, serum creatinine of 3.0, total bilirubin >50, and INR of 3.9. Creatinine was 0.09 at time of discharge from [facility] on 03/02/2015. She had large volume paracentesis performed during that admission. 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   Assessment/Plan:    Sepsis secondary to suspected SBP v/s SIRS - S/P recent admission at [facility] for SBP, followed by 2 more admissions to [facility] with SBP, last discharged 03/02/15. - Continue empiric cefepime and vancomycin.  - Diagnostic paracentesis done on admission, fluid cell count not suggestive of SBP w/ WBC only 12and low LDH. - WBC normal - no fever - she will complete her 5 day course of abx today. - Blood cultures remain negative to date.   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  - Has worsened significantly since discharge from [facility] just 3 days prior.  - MELD now 47, corresponding to estimated 3-mo survival of <5%; this could improve if renal function recovers.  - Has known esophageal varices with history of bleeding and multiple banding procedures; no hematemesis, melena or hematochezia now; BP cannot tolerate propranolol. - Has ascites, recommended for weekly large-vol para by [facility].  - Extremely poor prognosis, suspect she will have a terminal event over the next few days.  Hepatic encephalopathy  - Serum ammonia 131 - cont lactulose (increased 03/09/15) and rifaximin.  Severe chronic Hyponatremia  - Na nadir of 119 this admit - improving at acceptable rate.Currently 134.  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  - Transfuse for hemoglobin less than 7. Will give 1 unit today for hgb of 7. - Fecal occult blood testing pending.  Acute kidney injury concerning for hepatorenal syndrome  - Discharged from [facility] on 03/02/15 with SCr of 0.09 -  3.20 at time of this admit w/ peak at 3.43 thus far. - Continue to treat as HRS for now with midodrine 7.5 TID, octreotide infusion, and albumin 12.5 g QID.  - Avoid nephrotoxins where possible, avoid fluid shifts. - Creatinine  improved to 2.58.  Severe Hypokalemia  - Resolved with supplementation.  Hyperglycemia  - Hemoglobin A1c still pending. CBGs 128-154.   Normocytic anemia  - There is no evidence of acute blood loss - suspect "anemia of chronic disease" compounded by acute kidney injury.  Twitching - Likely from hepatic encephalopathy.   Oropharyngeal candidiasis  - Nystatin swish and swallow QID. Strep screen negative.   Severe protein calorie malnutrition - Poor prognosis with poor oral intake end-stage liver disease.  Goals of care  - Dr. Joseph Art participated in and extremely long meeting with the pt's mother on 03/07/15 during which he explained patient's very poor overall prognosis. - Mother not presently willing to consider Hospice.  DVT prophylaxis - Subcutaneous heparin.  Family Communication/Anticipated D/C date and plan/Code Status   Family Communication: No family currently at the bedside. Disposition Plan: Transfer to [facility] when bed available, currently no beds available.  Code Status: Full code.   IV Access:    Peripheral IV   Procedures and diagnostic studies:   Dg Chest 2 View  2015-03-19  CLINICAL DATA:  Acute onset of fever, nausea and lethargy. Initial encounter. EXAM: CHEST  2 VIEW COMPARISON:  Chest radiograph performed 02/03/2015 FINDINGS: The lungs are hypoexpanded. There is no evidence of focal opacification, pleural effusion or pneumothorax. The heart is normal in size; the mediastinal contour is within normal limits. No acute osseous abnormalities are seen. IMPRESSION: Lungs hypoexpanded but grossly clear. Electronically Signed   By: Roanna Raider M.D.   On: 03-19-2015 23:42     Medical Consultants:    Palliative Care: Romie Minus, MD   Anti-Infectives:   Cefepime 2/5>>2/10  Vancomycin 2/5>>2/10   Subjective:   [name] is non-verbal.  She is tremulous.  Alert.  Has a rectal tube in.  RN says she will only say her name.  No family  present.  Objective:    Filed Vitals:   03/09/15 2018 03/09/15 2300 03/10/15 0437 03/10/15 0800  BP: 129/98 114/76 116/30 114/75  Pulse:    87  Temp:  98.3 F (36.8 C) 98.5 F (36.9 C) 98.1 F (36.7 C)  TempSrc:  Oral Oral Oral  Resp: 32 33 18 36  Height:      Weight:   49.1 kg (108 lb 3.9 oz)   SpO2:  98% 98% 96%    Intake/Output Summary (Last 24 hours) at 03/10/15 0924 Last data filed at 03/10/15 0700  Gross per 24 hour  Intake   2218 ml  Output    500 ml  Net   1718 ml   Filed Weights   03/08/15 0500 03/09/15 0407 03/10/15 0437  Weight: 47.2 kg (104 lb 0.9 oz) 48.6 kg (107 lb 2.3 oz) 49.1 kg (108 lb 3.9 oz)    Exam: Gen:  Awake, tremulous Cardiovascular:  Tachy with hyperdynamic precordium, No M/R/G Respiratory:  Lungs CTAB Gastrointestinal:  Abdomen distended, NT Extremities:  No C/E/C   Data Reviewed:    Labs: Basic Metabolic Panel:  Recent Labs Lab 03/06/15 0335 03/07/15 0307 03/08/15 0250 03/08/15 1501 03/09/15 0420  NA 121* 124* 131* 133* 134*  K 3.5 4.0 2.6* 3.4* 3.5  CL 85* 91* 97* 102 101  CO2 22 15* 20* 16* 18*  GLUCOSE 78 137* 124* 123* 131*  BUN 44* 51* 47* 44* 42*  CREATININE 3.20* 3.43* 3.16* 2.76* 2.58*  CALCIUM 8.4* 8.5* 8.9 9.4 9.4  MG  --   --  2.4  --  2.1  PHOS  --   --  5.4*  --  4.3   GFR Estimated Creatinine Clearance: 22.9 mL/min (by C-G formula based on Cr of 2.58). Liver Function Tests:  Recent Labs Lab 03/11/2015 2034 03/06/15 0335 03/07/15 0307 03/08/15 0250 03/09/15 0420  AST 48* 32 36 37 42*  ALT 18 18 23 19 28   ALKPHOS 72 54 35* 29* 23*  BILITOT >50.0* >50.0* >50.0* >50.0* >50.0*  PROT 6.1* 5.6* 5.6* 5.6* 5.7*  ALBUMIN 3.3* 2.7* 2.9* 3.4* 3.6    Recent Labs Lab 03/28/2015 2034  LIPASE 30    Recent Labs Lab 03/08/15 0245 03/09/15 0420  AMMONIA 131* 60*   Coagulation profile  Recent Labs Lab 03/26/2015 2145  INR 3.94*    CBC:  Recent Labs Lab 03/18/2015 2034 03/06/15 0335 03/07/15 0307  03/08/15 0250 03/09/15 0420  WBC 8.4 6.8 5.5 3.6* 2.4*  NEUTROABS  --  5.0  --  2.8 2.0  HGB 10.6* 8.9* 8.5* 7.5* 7.0*  HCT 29.6* 24.8* 24.7* 21.8* 20.2*  MCV 96.7 95.8 97.6 96.5 98.5  PLT 131* 109* 76* 70* 63*   CBG:  Recent Labs Lab 03/09/15 0541 03/09/15 1150 03/09/15 1807 03/10/15 0008 03/10/15 0636  GLUCAP 127* 154* 128* 129* 128*   Sepsis Labs:  Recent Labs Lab 03/26/2015 2213 03/06/15 0032 03/06/15 0335 03/07/15 0307 03/08/15 0250 03/09/15 0420  PROCALCITON  --   --  2.40  --   --   --   WBC  --   --  6.8 5.5 3.6* 2.4*  LATICACIDVEN 2.49* 1.22 1.0  --   --   --    Microbiology Recent Results (from the past 240 hour(s))  Urine culture     Status: None   Collection Time: 03/14/2015  9:33 PM  Result Value Ref Range Status   Specimen Description URINE, CLEAN CATCH  Final   Special Requests NONE  Final   Culture 4,000 COLONIES/mL INSIGNIFICANT GROWTH  Final   Report Status 03/07/2015 FINAL  Final  Blood culture (routine x 2)     Status: None (Preliminary result)   Collection Time: 03/17/2015  9:45 PM  Result Value Ref Range Status   Specimen Description BLOOD LEFT ARM  Final   Special Requests BOTTLES DRAWN AEROBIC AND ANAEROBIC 5CC  Final   Culture NO GROWTH 3 DAYS  Final   Report Status PENDING  Incomplete  Blood culture (routine x 2)     Status: None (Preliminary result)   Collection Time: 03/21/2015  9:50 PM  Result Value Ref Range Status   Specimen Description BLOOD RIGHT ARM  Final   Special Requests BOTTLES DRAWN AEROBIC AND ANAEROBIC 5CC  Final   Culture NO GROWTH 3 DAYS  Final   Report Status PENDING  Incomplete  Rapid strep screen     Status: None   Collection Time: 03/13/2015 10:39 PM  Result Value Ref Range Status   Streptococcus, Group A Screen (Direct) NEGATIVE NEGATIVE Final    Comment: (NOTE) A Rapid Antigen test may result negative if the antigen level in the sample is below the detection level of this test. The FDA has not cleared this test  as a stand-alone test therefore the rapid antigen negative result has reflexed to a Group A Strep  culture.   Culture, group A strep     Status: None   Collection Time: March 15, 2015 10:39 PM  Result Value Ref Range Status   Specimen Description THROAT  Final   Special Requests Immunocompromised  Final   Culture NO GROUP A STREP (S.PYOGENES) ISOLATED  Final   Report Status 03/08/2015 FINAL  Final  Culture, body fluid-bottle     Status: None (Preliminary result)   Collection Time: 03/06/15  1:40 AM  Result Value Ref Range Status   Specimen Description FLUID PLEURAL  Final   Special Requests BOTTLES DRAWN AEROBIC AND ANAEROBIC 10CC ADD  Final   Culture NO GROWTH 2 DAYS  Final   Report Status PENDING  Incomplete  Gram stain     Status: None   Collection Time: 03/06/15  1:40 AM  Result Value Ref Range Status   Specimen Description FLUID PLEURAL  Final   Special Requests NONE  Final   Gram Stain   Final    CYTOSPIN SMEAR WBC PRESENT, PREDOMINANTLY MONONUCLEAR NO ORGANISMS SEEN    Report Status 03/07/2015 FINAL  Final  MRSA PCR Screening     Status: None   Collection Time: 03/06/15  9:03 AM  Result Value Ref Range Status   MRSA by PCR NEGATIVE NEGATIVE Final    Comment:        The GeneXpert MRSA Assay (FDA approved for NASAL specimens only), is one component of a comprehensive MRSA colonization surveillance program. It is not intended to diagnose MRSA infection nor to guide or monitor treatment for MRSA infections.      Medications:   . albumin human  12.5 g Intravenous 4 times per day  . azaTHIOprine  25 mg Oral BID  . ceFEPime (MAXIPIME) IV  1 g Intravenous Q24H  . hydrocortisone sod succinate (SOLU-CORTEF) inj  50 mg Intravenous Daily  . lactulose  30 g Oral TID  . midodrine  7.5 mg Oral TID WC  . nystatin  5 mL Mouth/Throat QID  . pantoprazole (PROTONIX) IV  40 mg Intravenous Q12H  . rifaximin  550 mg Oral BID  . sodium chloride flush  3 mL Intravenous Q12H  .  ursodiol  300 mg Oral BID  . vancomycin  500 mg Intravenous Q48H   Continuous Infusions: . dextrose 5 % and 0.9% NaCl 50 mL/hr at 03/08/15 0949  . octreotide  (SANDOSTATIN)    IV infusion 50 mcg/hr (03/09/15 2000)    Time spent: 35 minutes.  The patient is medically complex with multiple co-morbidities and is at high risk for clinical deterioration and requires high complexity decision making.    LOS: 5 days   RAMA,CHRISTINA  [facility] Pager [phone]. If unable to reach me by pager, please call my cell phone at [phone].  *Please refer to [email or web address], password [email or web address] to get updated schedule on who will round on this patient, as hospitalists switch teams weekly. If 7PM-7AM, please contact night-coverage at [email or web address], password [email or web address] for any overnight needs.  03/10/2015, 9:24 AM

## 2015-03-10 NOTE — Progress Notes (Signed)
CRITICAL VALUE ALERT  Critical value received:  Prothrombin 61.8, INR 7.64  Date of notification:  03/10/15  Time of notification:  1010  Critical value read back:Yes.    Nurse who received alert:  Toula Moos   MD notified (1st page):  Dr. Darnelle Catalan via text page.  Time of first page:  1015

## 2015-03-10 NOTE — Progress Notes (Signed)
Palliative Care RN note: Daily RN f/u this am. Pt is minimally verbally responsive with me; she will say hello but does not answer questions. Pt is now having jerking/twitching that is more pronounced than yesterday; staff RN is going to call MD. Blood in infusing. Plan peripheral f/u over the weekend. Palliative Care RN will f/u Monday if pt has not been transferred to [facility].  Donn Pierini, RN, BSN, [facility] 03/10/2015 2:56 PM

## 2015-03-10 NOTE — Progress Notes (Signed)
Pharmacy Antibiotic Note  Melanie Conley is a 25 y.o. female admitted on 03/07/2015 with hepatorenal syndrome and on day 6 vancomycin/cefepime for sepsis/? SBP.   -WBC= 2.4, SCr= 2.58, CrCl ~ 20  Plan: -No antibiotic dose changes needed -Consider defining length of therapy -Will follow renal function, cultures and clinical progress    Height:  (149.9 cm) Weight: 108 lb 3.9 oz (49.1 kg) IBW/kg (Calculated) : 43.2  Temp (24hrs), Avg:98.3 F (36.8 C), Min:98 F (36.7 C), Max:98.5 F (36.9 C)   Recent Labs Lab 03/26/2015 2034 03/14/2015 2213 03/06/15 0032 03/06/15 0335 03/07/15 0307 03/08/15 0250 03/08/15 1501 03/09/15 0420  WBC 8.4  --   --  6.8 5.5 3.6*  --  2.4*  CREATININE 3.00*  --   --  3.20* 3.43* 3.16* 2.76* 2.58*  LATICACIDVEN  --  2.49* 1.22 1.0  --   --   --   --     Estimated Creatinine Clearance: 22.9 mL/min (by C-G formula based on Cr of 2.58).    Allergies  Allergen Reactions  . Tylenol [Acetaminophen]     Can not take because of Liver  . 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 (pt was in the hospital when this occurred - May 2016) Has patient had a PCN reaction causing immediate rash, facial/tongue/throat swelling, SOB or lightheadedness with hypotension: No Has patient had a PCN reaction causing severe rash involving mucus membranes or skin necrosis: No Has patient had a PCN reaction that required hospitalization Yes Has patient had a PCN reaction occurring within the last 10 years: Yes If all of the above answers are "NO", then moay proceed with Ceph    Antimicrobials this admission: Vanc 2/5 >> Cefepime 2/5 >>   Microbiology results: 2/5 BCx: ngtd 2/5 Urine: ngtd 2/6 Pleural fluid >> pending Rapid strep neg Group A strep cx: neg   Thank you for allowing pharmacy to be a part of this patient's care.  Harland German, Pharm D 03/10/2015 9:42 AM

## 2015-03-11 LAB — COMPREHENSIVE METABOLIC PANEL
ALBUMIN: 4.2 g/dL (ref 3.5–5.0)
ALT: 22 U/L (ref 14–54)
ANION GAP: 13 (ref 5–15)
AST: 25 U/L (ref 15–41)
Alkaline Phosphatase: 17 U/L — ABNORMAL LOW (ref 38–126)
BUN: 48 mg/dL — ABNORMAL HIGH (ref 6–20)
CO2: 16 mmol/L — ABNORMAL LOW (ref 22–32)
Calcium: 10.3 mg/dL (ref 8.9–10.3)
Chloride: 116 mmol/L — ABNORMAL HIGH (ref 101–111)
Creatinine, Ser: 2.09 mg/dL — ABNORMAL HIGH (ref 0.44–1.00)
GFR calc Af Amer: 37 mL/min — ABNORMAL LOW (ref >60)
GFR calc non Af Amer: 32 mL/min — ABNORMAL LOW (ref >60)
GLUCOSE: 155 mg/dL — AB (ref 65–99)
POTASSIUM: 3.5 mmol/L (ref 3.5–5.1)
Sodium: 145 mmol/L (ref 135–145)
TOTAL PROTEIN: 6.1 g/dL — AB (ref 6.5–8.1)

## 2015-03-11 LAB — MAGNESIUM: Magnesium: 2 mg/dL (ref 1.7–2.4)

## 2015-03-11 LAB — TYPE AND SCREEN
ABO/RH(D): B POS
Antibody Screen: NEGATIVE
UNIT DIVISION: 0

## 2015-03-11 LAB — PHOSPHORUS: Phosphorus: 6.1 mg/dL — ABNORMAL HIGH (ref 2.5–4.6)

## 2015-03-11 LAB — PROTIME-INR
INR: 6.64 (ref 0.00–1.49)
PROTHROMBIN TIME: 55.6 s — AB (ref 11.6–15.2)

## 2015-03-11 LAB — CBC WITH DIFFERENTIAL/PLATELET
Basophils Absolute: 0 10*3/uL (ref 0.0–0.1)
Basophils Relative: 0 %
Eosinophils Absolute: 0 10*3/uL (ref 0.0–0.7)
Eosinophils Relative: 0 %
HCT: 22.7 % — ABNORMAL LOW (ref 36.0–46.0)
Hemoglobin: 7.6 g/dL — ABNORMAL LOW (ref 12.0–15.0)
Lymphocytes Relative: 3 %
Lymphs Abs: 0.1 10*3/uL — ABNORMAL LOW (ref 0.7–4.0)
MCH: 32.6 pg (ref 26.0–34.0)
MCHC: 33.5 g/dL (ref 30.0–36.0)
MCV: 97.4 fL (ref 78.0–100.0)
Monocytes Absolute: 0.1 10*3/uL (ref 0.1–1.0)
Monocytes Relative: 3 %
Neutro Abs: 4.6 10*3/uL (ref 1.7–7.7)
Neutrophils Relative %: 94 %
Platelets: 56 10*3/uL — ABNORMAL LOW (ref 150–400)
RBC: 2.33 MIL/uL — ABNORMAL LOW (ref 3.87–5.11)
RDW: 21.6 % — ABNORMAL HIGH (ref 11.5–15.5)
WBC: 4.8 10*3/uL (ref 4.0–10.5)

## 2015-03-11 LAB — GLUCOSE, CAPILLARY
GLUCOSE-CAPILLARY: 132 mg/dL — AB (ref 65–99)
GLUCOSE-CAPILLARY: 157 mg/dL — AB (ref 65–99)
Glucose-Capillary: 136 mg/dL — ABNORMAL HIGH (ref 65–99)
Glucose-Capillary: 138 mg/dL — ABNORMAL HIGH (ref 65–99)

## 2015-03-11 LAB — CULTURE, BLOOD (ROUTINE X 2)
CULTURE: NO GROWTH
Culture: NO GROWTH

## 2015-03-11 LAB — AMMONIA: AMMONIA: 142 umol/L — AB (ref 9–35)

## 2015-03-11 LAB — OCCULT BLOOD X 1 CARD TO LAB, STOOL: Fecal Occult Bld: POSITIVE — AB

## 2015-03-11 MED ORDER — LACTULOSE 10 GM/15ML PO SOLN
30.0000 g | Freq: Four times a day (QID) | ORAL | Status: DC
  Administered 2015-03-11 (×2): 30 g via ORAL
  Administered 2015-03-12: 15 g via ORAL
  Filled 2015-03-11 (×6): qty 45

## 2015-03-11 MED ORDER — WHITE PETROLATUM GEL
Status: AC
  Administered 2015-03-11: 19:00:00
  Filled 2015-03-11: qty 1

## 2015-03-11 MED ORDER — LACTULOSE 10 GM/15ML PO SOLN: 30.0000 g | Freq: Four times a day (QID) | ORAL | Status: DC

## 2015-03-11 MED ORDER — WHITE PETROLATUM GEL: Status: DC | PRN

## 2015-03-11 NOTE — Progress Notes (Signed)
Notified MD regarding pt inability to coordinate swallowing meds or liquids. Pt nods appropriately and follows simple commands with delayed response. MD to come to bedside to assess pt. Will continue to follow.

## 2015-03-11 NOTE — Progress Notes (Signed)
Progress Note   Melanie Conley MWN:[id number] DOB: [birth year] DOA: 2015/03/28 PCP: Woodfin Ganja, MD   Brief Narrative:   Melanie Conley is an 25 y.o. female with a PMH of autoimmune hepatitis and primary sclerosing cholangitis with cirrhosiswho was admitted 03-28-2015 with a chief complaint of a 1 day history of of fever and abdominal pain. Patient is followed by [facility] gastroenterology and was discharged from that institution on 03/02/2015 following treatment for SBP. She initially did okay back at home until developing fever and severe abdominal pain acutely on 03-28-15. Her adoptive mother states Melanie Conley has had a difficult course recently with serial hospital admissions related to cirrhosis. Complications have included multiple courses of SBP, acute blood loss from esophageal varices, and hepatic encephalopathy. Per [facility] notes, she was apparently on the pediatric transplant list but was never able to be transitioned to the adult list due to an inability to pass the psychosocial screening. She has been managed with prophylactic Bactrim, rifaximin, lactulose, prednisone, Imuran, and ursodiol. She's had numerous banding procedures for her varices. There's been no recent hematemesis, melena, or hematochezia.  In ED, patient was found to have oral temperature 37.9 C, sinus tachycardia in the 110s, and blood pressure in the 100/50 range. Her abdomen was noted to be distended and tender. Paracentesis was performed. She was started on empiric cefepime and vancomycin for presumed SBP. Blood work returned notable for sodium of 119, BUN of 42, serum creatinine of 3.0, total bilirubin >50, and INR of 3.9. Creatinine was 0.09 at time of discharge from [facility] on 03/02/2015. She had large volume paracentesis performed during that admission. 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   Assessment/Plan:    Sepsis secondary to suspected SBP v/s SIRS - S/P recent admission at [facility] for SBP, followed by 2 more admissions to [facility] with SBP, last discharged 03/02/15. - Continue empiric cefepime  - Diagnostic paracentesis done on admission, fluid cell count not suggestive of SBP w/ WBC only 12and low LDH. - WBC normal - no fever -  - Blood cultures remain negative to date.   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  - Has worsened significantly since discharge from [facility] just 3 days prior.  - MELD now 47, corresponding to estimated 3-mo survival of <5%; this could improve if renal function recovers.  - Has known esophageal varices with history of bleeding and multiple banding procedures; no hematemesis, melena or hematochezia now; BP cannot tolerate propranolol. - Has ascites, recommended for weekly large-vol para by [facility]. might need repeat paracentesis if renal function stabilized.  - Extremely poor prognosis, suspect she will have a terminal event over the next few days.  Hepatic encephalopathy  - Serum ammonia 131 --- 142 increased today .  will increase  lactulose (increased 03/09/15) and rifaximin. -Change lactulose to every 6 hours. If unable to swallow might need NG  tube  Diarrhea;  Has rectal tube since the 9.  Has been getting lactulose.   Afebrile, CBC normal.   Severe chronic Hyponatremia  - Na nadir of 119 this admit - improving at acceptable rate.Currently 134.  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  - Transfuse  for hemoglobin less than 7. Received  1 unit on 2-10 for hgb of 6.3 - Fecal occult blood positive.   Acute kidney injury concerning for hepatorenal syndrome  - Discharged from [facility] on 03/02/15 with SCr of 0.09 - 3.20 at time of this admit w/  peak at 3.43 thus far. - Continue to treat as HRS for now with midodrine 7.5 TID, octreotide infusion, and albumin 12.5 g QID.  - Avoid nephrotoxins where possible, avoid fluid shifts. - Creatinine trending down 2.09  Severe Hypokalemia  - Resolved with supplementation.  Hyperglycemia  - Hemoglobin A1c still pending. CBGs 128-154.   Normocytic anemia  - There is no evidence of acute blood loss - suspect "anemia of chronic disease" compounded by acute kidney injury. -repeat hb tonight , transfusion as needed.   Twitching - Likely from hepatic encephalopathy.   Oropharyngeal candidiasis  - Nystatin swish and swallow QID. Strep screen negative.   Severe protein calorie malnutrition - Poor prognosis with poor oral intake end-stage liver disease.  Goals of care  - Dr. Joseph Art participated in and extremely long meeting with the pt's mother on 03/07/15 during which he explained patient's very poor overall prognosis. - Mother not presently willing to consider Hospice.  DVT prophylaxis - SCD. No anticoagulation due to high risk for bleeding.     Family Communication/Anticipated D/C date and plan/Code Status   Family Communication: No family currently at the bedside. Disposition Plan: Transfer to [facility] when bed available, currently no beds available.  Code Status: Full code.   IV Access:    Peripheral IV   Procedures and diagnostic studies:   Dg Chest 2 View  03/17/2015  CLINICAL DATA:  Acute onset of fever, nausea and lethargy. Initial encounter. EXAM: CHEST  2 VIEW COMPARISON:  Chest radiograph performed 02/03/2015 FINDINGS: The lungs are hypoexpanded. There is no evidence of focal opacification, pleural effusion or pneumothorax. The heart is normal in size; the mediastinal contour is within normal limits. No acute osseous abnormalities are seen. IMPRESSION: Lungs hypoexpanded but grossly clear. Electronically Signed   By: Roanna Raider M.D.   On: 03/23/2015 23:42      Medical Consultants:    Palliative Care: Romie Minus, MD   Anti-Infectives:   Cefepime 2/5>>2/10  Vancomycin 2/5>>2/10   Subjective:   Melanie Conley is non-verbal.  She is tremulous at times.  Alert.  Has a rectal tube in.  Would say her name. Per nurse patient was not able to swallow meds this am. If  unable to swallow meds might need NG tube  Objective:    Filed Vitals:   03/11/15 0358 03/11/15 0410 03/11/15 0600 03/11/15 0815  BP:  128/88  115/79  Pulse:    81  Temp: 98.2 F (36.8 C)   98 F (36.7 C)  TempSrc: Oral   Oral  Resp:  21  30  Height:      Weight:   50.5 kg (111 lb 5.3 oz)   SpO2:    92%    Intake/Output Summary (Last 24 hours) at 03/11/15 1100 Last data filed at 03/11/15 0800  Gross per 24 hour  Intake   2375 ml  Output    400 ml  Net   1975 ml   Filed Weights   03/09/15 0407 03/10/15 0437 03/11/15 0600  Weight: 48.6 kg (107 lb 2.3 oz) 49.1 kg (108 lb 3.9 oz) 50.5 kg (111 lb 5.3 oz)    Exam: Gen:  Awake, tremulous at times.  Cardiovascular:  Tachy with hyperdynamic precordium, No M/R/G Respiratory:  Lungs CTAB Gastrointestinal:  Abdomen distended, NT Extremities:  No C/E/C   Data Reviewed:    Labs: Basic Metabolic Panel:  Recent Labs Lab 03/08/15 0250 03/08/15 1501 03/09/15 0420 03/10/15 0529 03/11/15 0244  NA 131* 133* 134* 141 145  K 2.6* 3.4* 3.5 3.4* 3.5  CL 97* 102 101 112* 116*  CO2 20* 16* 18* 16* 16*  GLUCOSE 124* 123* 131* 141* 155*  BUN 47* 44* 42* 43* 48*  CREATININE 3.16* 2.76* 2.58* 2.35* 2.09*  CALCIUM 8.9 9.4 9.4 10.0 10.3  MG 2.4  --  2.1 2.1 2.0  PHOS 5.4*  --  4.3 4.3 6.1*   GFR Estimated Creatinine Clearance: 28.3 mL/min (by C-G formula based on Cr of 2.09). Liver Function Tests:  Recent Labs Lab 03/07/15 0307 03/08/15 0250 03/09/15 0420 03/10/15 0529 03/11/15 0244  AST 36 37 42* 40 25  ALT ALKPHOS 35* 29* 23* 16* 17*  BILITOT >50.0* >50.0* >50.0* >50.0* >50.0*   PROT 5.6* 5.6* 5.7* 5.8* 6.1*  ALBUMIN 2.9* 3.4* 3.6 3.9 4.2    Recent Labs Lab 03-26-2015 2034  LIPASE 30    Recent Labs Lab 03/08/15 0245 03/09/15 0420 03/10/15 0930 03/11/15 0244  AMMONIA 131* 60* 63* 142*   Coagulation profile  Recent Labs Lab 03-26-15 2145 03/10/15 0529 03/11/15 0244  INR 3.94* 7.64* 6.64*    CBC:  Recent Labs Lab 03/06/15 0335  03/08/15 0250 03/09/15 0420 03/10/15 0529 03/10/15 1443 03/11/15 0244  WBC 6.8  < > 3.6* 2.4* 4.3 6.6 4.8  NEUTROABS 5.0  --  2.8 2.0 3.9  --  4.6  HGB 8.9*  < > 7.5* 7.0* 6.3* 8.9* 7.6*  HCT 24.8*  < > 21.8* 20.2* 18.6* 25.8* 22.7*  MCV 95.8  < > 96.5 98.5 100.5* 97.0 97.4  PLT 109*  < > 70* 63* 69* 60* 56*  < > = values in this interval not displayed. CBG:  Recent Labs Lab 03/10/15 0636 03/10/15 1225 03/10/15 1917 03/10/15 2316 03/11/15 0808  GLUCAP 128* 139* 140* 150* 136*   Sepsis Labs:  Recent Labs Lab 03-26-15 2213 03/06/15 0032 03/06/15 0335  03/09/15 0420 03/10/15 0529 03/10/15 1443 03/11/15 0244  PROCALCITON  --   --  2.40  --   --   --   --   --   WBC  --   --  6.8  < > 2.4* 4.3 6.6 4.8  LATICACIDVEN 2.49* 1.22 1.0  --   --   --   --   --   < > = values in this interval not displayed. Microbiology Recent Results (from the past 240 hour(s))  Urine culture     Status: None   Collection Time: 26-Mar-2015  9:33 PM  Result Value Ref Range Status   Specimen Description URINE, CLEAN CATCH  Final   Special Requests NONE  Final   Culture 4,000 COLONIES/mL INSIGNIFICANT GROWTH  Final   Report Status 03/07/2015 FINAL  Final  Blood culture (routine x 2)     Status: None   Collection Time: 03/26/2015  9:45 PM  Result Value Ref Range Status   Specimen Description BLOOD LEFT ARM  Final   Special Requests BOTTLES DRAWN AEROBIC AND ANAEROBIC 5CC  Final   Culture NO GROWTH 5 DAYS  Final   Report Status 03/11/2015 FINAL  Final  Blood culture (routine x 2)     Status: None  Collection Time:  03/23/2015  9:50 PM  Result Value Ref Range Status   Specimen Description BLOOD RIGHT ARM  Final   Special Requests BOTTLES DRAWN AEROBIC AND ANAEROBIC 5CC  Final   Culture NO GROWTH 5 DAYS  Final   Report Status 03/11/2015 FINAL  Final  Rapid strep screen     Status: None   Collection Time: 03/27/2015 10:39 PM  Result Value Ref Range Status   Streptococcus, Group A Screen (Direct) NEGATIVE NEGATIVE Final    Comment: (NOTE) A Rapid Antigen test may result negative if the antigen level in the sample is below the detection level of this test. The FDA has not cleared this test as a stand-alone test therefore the rapid antigen negative result has reflexed to a Group A Strep culture.   Culture, group A strep     Status: None   Collection Time: 03/07/2015 10:39 PM  Result Value Ref Range Status   Specimen Description THROAT  Final   Special Requests Immunocompromised  Final   Culture NO GROUP A STREP (S.PYOGENES) ISOLATED  Final   Report Status 03/08/2015 FINAL  Final  Culture, body fluid-bottle     Status: None (Preliminary result)   Collection Time: 03/06/15  1:40 AM  Result Value Ref Range Status   Specimen Description FLUID PLEURAL  Final   Special Requests BOTTLES DRAWN AEROBIC AND ANAEROBIC 10CC ADD  Final   Culture NO GROWTH 4 DAYS  Final   Report Status PENDING  Incomplete  Gram stain     Status: None   Collection Time: 03/06/15  1:40 AM  Result Value Ref Range Status   Specimen Description FLUID PLEURAL  Final   Special Requests NONE  Final   Gram Stain   Final    CYTOSPIN SMEAR WBC PRESENT, PREDOMINANTLY MONONUCLEAR NO ORGANISMS SEEN    Report Status 03/07/2015 FINAL  Final  MRSA PCR Screening     Status: None   Collection Time: 03/06/15  9:03 AM  Result Value Ref Range Status   MRSA by PCR NEGATIVE NEGATIVE Final    Comment:        The GeneXpert MRSA Assay (FDA approved for NASAL specimens only), is one component of a comprehensive MRSA colonization surveillance  program. It is not intended to diagnose MRSA infection nor to guide or monitor treatment for MRSA infections.      Medications:   . sodium chloride   Intravenous Once  . albumin human  12.5 g Intravenous 4 times per day  . azaTHIOprine  25 mg Oral BID  . ceFEPime (MAXIPIME) IV  1 g Intravenous Q24H  . hydrocortisone sod succinate (SOLU-CORTEF) inj  50 mg Intravenous Daily  . lactulose  30 g Oral TID  . midodrine  7.5 mg Oral TID WC  . nystatin  5 mL Mouth/Throat QID  . pantoprazole (PROTONIX) IV  40 mg Intravenous Q12H  . rifaximin  550 mg Oral BID  . sodium chloride flush  3 mL Intravenous Q12H  . ursodiol  300 mg Oral BID   Continuous Infusions: . dextrose 5 % and 0.9% NaCl 50 mL/hr at 03/11/15 0800  . octreotide  (SANDOSTATIN)    IV infusion 50 mcg/hr (03/11/15 1000)    Time spent: 35 minutes.  The patient is medically complex with multiple co-morbidities and is at high risk for clinical deterioration and requires high complexity decision making.    LOS: 6 days   Hartley Barefoot A  [facility] Pager [phone]  Please  refer to [email or web address], password [email or web address] to get updated schedule on who will round on this patient, as hospitalists switch teams weekly. If 7PM-7AM, please contact night-coverage at [email or web address], password [email or web address] for any overnight needs.  03/11/2015, 11:00 AM

## 2015-03-12 DIAGNOSIS — E43 Unspecified severe protein-calorie malnutrition: Secondary | ICD-10-CM

## 2015-03-12 DIAGNOSIS — K767 Hepatorenal syndrome: Secondary | ICD-10-CM

## 2015-03-12 DIAGNOSIS — K754 Autoimmune hepatitis: Secondary | ICD-10-CM

## 2015-03-12 DIAGNOSIS — K830 Cholangitis: Secondary | ICD-10-CM

## 2015-03-12 DIAGNOSIS — K7290 Hepatic failure, unspecified without coma: Secondary | ICD-10-CM

## 2015-03-12 DIAGNOSIS — D61818 Other pancytopenia: Secondary | ICD-10-CM

## 2015-03-12 DIAGNOSIS — E871 Hypo-osmolality and hyponatremia: Secondary | ICD-10-CM

## 2015-03-12 DIAGNOSIS — N179 Acute kidney failure, unspecified: Secondary | ICD-10-CM

## 2015-03-12 DIAGNOSIS — B370 Candidal stomatitis: Secondary | ICD-10-CM

## 2015-03-12 DIAGNOSIS — E870 Hyperosmolality and hypernatremia: Secondary | ICD-10-CM

## 2015-03-12 DIAGNOSIS — K652 Spontaneous bacterial peritonitis: Secondary | ICD-10-CM

## 2015-03-12 DIAGNOSIS — K7460 Unspecified cirrhosis of liver: Secondary | ICD-10-CM

## 2015-03-12 LAB — COMPREHENSIVE METABOLIC PANEL
ALK PHOS: 16 U/L — AB (ref 38–126)
ALT: 23 U/L (ref 14–54)
ANION GAP: 14 (ref 5–15)
AST: 39 U/L (ref 15–41)
Albumin: 4.3 g/dL (ref 3.5–5.0)
BUN: 49 mg/dL — ABNORMAL HIGH (ref 6–20)
CO2: 16 mmol/L — ABNORMAL LOW (ref 22–32)
CREATININE: 1.94 mg/dL — AB (ref 0.44–1.00)
Calcium: 11 mg/dL — ABNORMAL HIGH (ref 8.9–10.3)
Chloride: 121 mmol/L — ABNORMAL HIGH (ref 101–111)
GFR calc non Af Amer: 35 mL/min — ABNORMAL LOW (ref >60)
GFR, EST AFRICAN AMERICAN: 41 mL/min — AB (ref >60)
GLUCOSE: 163 mg/dL — AB (ref 65–99)
Potassium: 3.1 mmol/L — ABNORMAL LOW (ref 3.5–5.1)
Sodium: 151 mmol/L — ABNORMAL HIGH (ref 135–145)
Total Protein: 6.3 g/dL — ABNORMAL LOW (ref 6.5–8.1)

## 2015-03-12 LAB — CBC WITH DIFFERENTIAL/PLATELET
BASOS PCT: 0 %
Basophils Absolute: 0 10*3/uL (ref 0.0–0.1)
EOS PCT: 0 %
Eosinophils Absolute: 0 10*3/uL (ref 0.0–0.7)
HEMATOCRIT: 21.2 % — AB (ref 36.0–46.0)
HEMOGLOBIN: 7.1 g/dL — AB (ref 12.0–15.0)
LYMPHS PCT: 3 %
Lymphs Abs: 0.1 10*3/uL — ABNORMAL LOW (ref 0.7–4.0)
MCH: 33.3 pg (ref 26.0–34.0)
MCHC: 33.5 g/dL (ref 30.0–36.0)
MCV: 99.5 fL (ref 78.0–100.0)
MONOS PCT: 2 %
Monocytes Absolute: 0.1 10*3/uL (ref 0.1–1.0)
NEUTROS ABS: 3.9 10*3/uL (ref 1.7–7.7)
Neutrophils Relative %: 95 %
Platelets: 40 10*3/uL — ABNORMAL LOW (ref 150–400)
RBC: 2.13 MIL/uL — ABNORMAL LOW (ref 3.87–5.11)
RDW: 21.7 % — ABNORMAL HIGH (ref 11.5–15.5)
WBC: 4.1 10*3/uL (ref 4.0–10.5)

## 2015-03-12 LAB — GLUCOSE, CAPILLARY
GLUCOSE-CAPILLARY: 144 mg/dL — AB (ref 65–99)
GLUCOSE-CAPILLARY: 143 mg/dL — AB (ref 65–99)
GLUCOSE-CAPILLARY: 132 mg/dL — AB (ref 65–99)
Glucose-Capillary: 122 mg/dL — ABNORMAL HIGH (ref 65–99)
Glucose-Capillary: 162 mg/dL — ABNORMAL HIGH (ref 65–99)

## 2015-03-12 LAB — CULTURE, BODY FLUID W GRAM STAIN -BOTTLE: Culture: NO GROWTH

## 2015-03-12 LAB — AMMONIA: Ammonia: 131 umol/L — ABNORMAL HIGH (ref 9–35)

## 2015-03-12 LAB — PROTIME-INR
INR: 7.42 (ref 0.00–1.49)
PROTHROMBIN TIME: 60.5 s — AB (ref 11.6–15.2)

## 2015-03-12 LAB — PHOSPHORUS: PHOSPHORUS: 5 mg/dL — AB (ref 2.5–4.6)

## 2015-03-12 LAB — MAGNESIUM: Magnesium: 2.1 mg/dL (ref 1.7–2.4)

## 2015-03-12 MED ORDER — FLUCONAZOLE IN SODIUM CHLORIDE 100-0.9 MG/50ML-% IV SOLN
100.0000 mg | INTRAVENOUS | Status: DC
  Administered 2015-03-13: 100 mg via INTRAVENOUS
  Filled 2015-03-12 (×2): qty 50

## 2015-03-12 MED ORDER — FLUCONAZOLE IN SODIUM CHLORIDE 200-0.9 MG/100ML-% IV SOLN
200.0000 mg | Freq: Once | INTRAVENOUS | Status: AC
  Administered 2015-03-12: 200 mg via INTRAVENOUS
  Filled 2015-03-12: qty 100

## 2015-03-12 MED ORDER — POTASSIUM CHLORIDE 10 MEQ/100ML IV SOLN
10.0000 meq | INTRAVENOUS | Status: AC
  Administered 2015-03-12 (×5): 10 meq via INTRAVENOUS
  Filled 2015-03-12 (×5): qty 100

## 2015-03-12 MED ORDER — DEXTROSE 5 % IV SOLN
INTRAVENOUS | Status: DC
  Administered 2015-03-12: 12:00:00 via INTRAVENOUS

## 2015-03-12 MED ORDER — CETYLPYRIDINIUM CHLORIDE 0.05 % MT LIQD
7.0000 mL | Freq: Two times a day (BID) | OROMUCOSAL | Status: DC
  Administered 2015-03-12 – 2015-03-13 (×4): 7 mL via OROMUCOSAL

## 2015-03-12 MED ORDER — CHLORHEXIDINE GLUCONATE 0.12 % MT SOLN
15.0000 mL | Freq: Two times a day (BID) | OROMUCOSAL | Status: DC
  Administered 2015-03-12 – 2015-03-13 (×4): 15 mL via OROMUCOSAL
  Filled 2015-03-12: qty 15

## 2015-03-12 NOTE — Progress Notes (Addendum)
Pt having difficulty following commands and swallowing during day shift. Plan to place cortrak on 03/13/15. No PO meds capable of IV administration. Holding PO meds until cortrak placed for patient safety. PA and family made aware.

## 2015-03-12 NOTE — Progress Notes (Signed)
Text paged MD about pts BP in the 140's/100's. No orders at this time.

## 2015-03-12 NOTE — Progress Notes (Addendum)
[facility] PROGRESS NOTE    Progress Note   KAMYLA OLEJNIK XLK:[id number] DOB: September 21, 1990 DOA: 03-10-15 PCP: Woodfin Ganja, MD   Brief Narrative:   GESSICA JAWAD is an 25 y.o. female with past medical history of autoimmune hepatitis and primary sclerae icing cholangitis with cirrhosis was admitted on 2015-03-10 with chief complaint of fever and abdominal pain. For her liver cirrhosis she goes to [facility] where she was discharged from this institution on 03/02/2015 following treatment for his SBP, esophageal varices and hepatic encephalopathy. Apparently C she was on the pediatric transplant list but was never able to transition to oral transplant list due to psychological screening. She has been managed prophylactically with Bactrim, rifaximin, lactulose, prednisone, Imuran and ursodil. In the ED 8, was found to have sinus tachycardia with a blood pressure 100/50 her own abdomen was noted to be distended and tender, large volume paracentesis was performed she was started on empiric cefepime and vancomycin proceeding his SBP, a chemistry returned with a sodium of 119 view and a 47 creatinine 3 total bilirubin greater than 50 and an INR of 3.9. (At the time of discharge from [facility] her creatinine was 0.9 at 03/02/2015)  Assessment/Plan:   Sepsis of unclear etiology: Status post large volume paracentesis on admission was only 12 white blood cells and fluid. Blood cultures remain negative till date. Vancomycin was DC'd she will continue on cefepime. She did not have a leukocytosis, was not febrile on admission and blood pressure was stable.  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 Her estimated mortality of less than 5% in the next 3 months, patient is unlikely to have any meaningful recovery from this admission. She has progressively worsened since discharge from [facility]. Her blood pressures  not able to tolerate propanolol. She will need ongoing paracentesis. Mother is unrealistic about expectation the patient continues to worsen. Despite treatment. I think she will pass away here in the hospital, Palliative Care was consulted. I wish palliative care would meet with mother.  Hepatic encephalopathy: Continue lactulose and rifaximin. Mother refused her NG tube. She requested the medication to BE crushed and given. I explained to her the risk of aspiration and these adding more complications to her condition. Should she relates she understands the risk and benefits and elected continue to give her medication by mouth.  Severe diarrhea: Likely due to lactulose she's having 9-10 bowel movements a day. She continues to be afebrile.  Chronic hyponatremia: Now resolved. She is now hyponatremic. 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 Likely pointing to worsening liver disease. Please continue to trend down along with her hemoglobin today which is 7.1.  Severe hypokalemia: Resolved with supplementation.  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 Avoid nephrotoxic drugs. Her creatinine is improving slowly.  Normocytic anemia: FOBT positive no signs of overt bleeding.  Twitching: Likely from hepatic encephalopathy continue rifaximin and lactulose.  Oropharyngeal candidiasis: Start  Diflucan IV.  Severe protein caloric malnutrition: Poor prognoses due to liver disease.  Goals of care    DVT Prophylaxis - Lovenox ordered.  Family Communication: mother Disposition Plan: unable to determine unlikely to go home Code Status:     Code Status Orders        Start     Ordered   03/06/15 830 111 0211  Full code   Continuous     03/06/15 0140    Code Status History    Date Active Date Inactive  Code Status Order ID Comments User Context   02/03/2015  4:07 PM 02/06/2015  7:34 PM Full Code [id number]  Gwenyth Bender, NP ED   01/17/2015  5:06 PM 01/18/2015  3:21 PM Full Code [id number]  Leatha Gilding, MD ED   11/23/2014  4:47 AM 11/25/2014  3:18 PM Full Code [id number]  April Palumbo, MD ED   11/20/2014  8:26 AM 11/20/2014  2:37 PM Full Code [id number]  Calvert Cantor, MD ED   10/09/2014  8:13 AM 10/12/2014  6:09 PM Full Code [id number]  Eston Esters, MD ED   09/11/2014  9:31 AM 09/13/2014  7:43 PM Full Code [id number]  Stephani Police, PA-C Inpatient   09/05/2014  6:40 AM 09/07/2014  7:06 PM Full Code [id number]  Rolly Salter, MD ED   07/30/2014  2:54 AM 07/31/2014  8:06 PM Full Code [id number]  Lorretta Harp, MD Inpatient   06/04/2014  5:27 AM 06/09/2014 12:59 AM Full Code [id number]  Ron Parker, MD Inpatient   05/27/2014  4:00 PM 05/29/2014  9:23 PM Full Code [id number]  Catarina Hartshorn, MD Inpatient   01/12/2014  6:23 PM 01/16/2014  6:49 PM Full Code [id number]  Richarda Overlie, MD Inpatient   07/22/2011  3:42 AM 07/24/2011 12:10 AM Full Code [id number]  Massie Maroon, MD ED        IV Access:    Peripheral IV   Procedures and diagnostic studies:   No results found.   Medical Consultants:    None.  Anti-Infectives:   Anti-infectives    Start     Dose/Rate Route Frequency Ordered Stop   03/07/15 2100  ceFEPIme (MAXIPIME) 1 g in dextrose 5 % 50 mL IVPB     1 g 100 mL/hr over 30 Minutes Intravenous Every 24 hours 03/07/15 0746 03/11/15 2135   03/07/15 1300  vancomycin (VANCOCIN) 500 mg in sodium chloride 0.9 % 100 mL IVPB  Status:  Discontinued     500 mg 100 mL/hr over 60 Minutes Intravenous Every 48 hours 03/06/15 1329 03/10/15 1420   03/06/15 2100  ceFEPIme (MAXIPIME) 2 g in dextrose 5 % 50 mL IVPB  Status:  Discontinued     2 g 100 mL/hr over 30 Minutes Intravenous Every 24 hours 03/17/2015 2149 03/07/15 0746   03/06/15 1800  vancomycin (VANCOCIN) 500 mg in sodium chloride 0.9 % 100 mL IVPB  Status:   Discontinued     500 mg 100 mL/hr over 60 Minutes Intravenous Every 24 hours 03/28/2015 2149 03/06/15 1329   03/06/15 1000  rifaximin (XIFAXAN) tablet 550 mg     550 mg Oral 2 times daily 03/06/15 0140     03/06/15 0000  ceFEPIme 2 g in dextrose 5 % 50 mL     2 g 100 mL/hr over 30 Minutes Intravenous Every 24 hours 03/06/15 1714     03/01/2015 2145  ceFEPIme (MAXIPIME) 2 g in dextrose 5 % 50 mL IVPB     2 g 100 mL/hr over 30 Minutes Intravenous  Once 03/10/2015 2139 03/14/2015 2240   03/20/2015 2145  vancomycin (VANCOCIN) IVPB 750 mg/150 ml premix     750 mg 150 mL/hr over 60 Minutes Intravenous  Once 03/12/2015 2140 03/06/15 0008      Subjective:    Elvia D Strength nonverbal woman responsive to pain.  Objective:  Filed Vitals:   03/12/15 0238 03/12/15 0332 03/12/15 0749 03/12/15 0800  BP:  154/100 132/93 133/93  Pulse:  84 96 96  Temp:  98.4 F (36.9 C) 97.9 F (36.6 C)   TempSrc:  Oral Core (Comment)   Resp:  Height:      Weight: 49.8 kg (109 lb 12.6 oz)     SpO2:  96% 95% 96%    Intake/Output Summary (Last 24 hours) at 03/12/15 1042 Last data filed at 03/12/15 0800  Gross per 24 hour  Intake   1995 ml  Output    754 ml  Net   1241 ml   Filed Weights   03/10/15 0437 03/11/15 0600 03/12/15 0238  Weight: 49.1 kg (108 lb 3.9 oz) 50.5 kg (111 lb 5.3 oz) 49.8 kg (109 lb 12.6 oz)    Exam: Gen:  NAD, she is nonverbal Cardiovascular:  RRR. Chest and lungs:   CTAB Abdomen:  Abdomen is soft distended nontender positive bowel sounds Extremities:  No edema   Data Reviewed:    Labs: Basic Metabolic Panel:  Recent Labs Lab 03/08/15 0250 03/08/15 1501 03/09/15 0420 03/10/15 0529 03/11/15 0244 03/12/15 0558  NA 131* 133* 134* 141 145 151*  K 2.6* 3.4* 3.5 3.4* 3.5 3.1*  CL 97* 102 101 112* 116* 121*  CO2 20* 16* 18* 16* 16* 16*  GLUCOSE 124* 123* 131* 141* 155* 163*  BUN 47* 44* 42* 43* 48* 49*  CREATININE 3.16* 2.76* 2.58* 2.35* 2.09* 1.94*  CALCIUM  8.9 9.4 9.4 10.0 10.3 11.0*  MG 2.4  --  2.1 2.1 2.0 2.1  PHOS 5.4*  --  4.3 4.3 6.1* 5.0*   GFR Estimated Creatinine Clearance: 30.5 mL/min (by C-G formula based on Cr of 1.94). Liver Function Tests:  Recent Labs Lab 03/08/15 0250 03/09/15 0420 03/10/15 0529 03/11/15 0244 03/12/15 0558  AST 37 42* 40 25 39  ALT ALKPHOS 29* 23* 16* 17* 16*  BILITOT >50.0* >50.0* >50.0* >50.0* >50.0*  PROT 5.6* 5.7* 5.8* 6.1* 6.3*  ALBUMIN 3.4* 3.6 3.9 4.2 4.3    Recent Labs Lab Mar 13, 2015 2034  LIPASE 30    Recent Labs Lab 03/08/15 0245 03/09/15 0420 03/10/15 0930 03/11/15 0244 03/12/15 0558  AMMONIA 131* 60* 63* 142* 131*   Coagulation profile  Recent Labs Lab 03/13/2015 2145 03/10/15 0529 03/11/15 0244 03/12/15 0558  INR 3.94* 7.64* 6.64* 7.42*    CBC:  Recent Labs Lab 03/08/15 0250 03/09/15 0420 03/10/15 0529 03/10/15 1443 03/11/15 0244 03/12/15 0558  WBC 3.6* 2.4* 4.3 6.6 4.8 4.1  NEUTROABS 2.8 2.0 3.9  --  4.6 PENDING  HGB 7.5* 7.0* 6.3* 8.9* 7.6* 7.1*  HCT 21.8* 20.2* 18.6* 25.8* 22.7* 21.2*  MCV 96.5 98.5 100.5* 97.0 97.4 99.5  PLT 70* 63* 69* 60* 56* 40*   Cardiac Enzymes: No results for input(s): CKTOTAL, CKMB, CKMBINDEX, TROPONINI in the last 168 hours. BNP (last 3 results) No results for input(s): PROBNP in the last 8760 hours. CBG:  Recent Labs Lab 03/11/15 0808 03/11/15 1212 03/11/15 1855 03/11/15 2355 03/12/15 0748  GLUCAP 136* 138* 157* 162* 144*   D-Dimer: No results for input(s): DDIMER in the last 72 hours. Hgb A1c: No results for input(s): HGBA1C in the last 72 hours. Lipid Profile: No results for input(s): CHOL, HDL, LDLCALC, TRIG, CHOLHDL, LDLDIRECT in the last 72 hours. Thyroid function studies: No results for input(s): TSH, T4TOTAL, T3FREE, THYROIDAB in the last  72 hours.  Invalid input(s): FREET3 Anemia work up: No results for input(s): VITAMINB12, FOLATE, FERRITIN, TIBC, IRON, RETICCTPCT in the last 72  hours. Sepsis Labs:  Recent Labs Lab 2015-03-28 2213 03/06/15 0032 03/06/15 0335  03/10/15 0529 03/10/15 1443 03/11/15 0244 03/12/15 0558  PROCALCITON  --   --  2.40  --   --   --   --   --   WBC  --   --  6.8  < > 4.3 6.6 4.8 4.1  LATICACIDVEN 2.49* 1.22 1.0  --   --   --   --   --   < > = values in this interval not displayed. Microbiology Recent Results (from the past 240 hour(s))  Urine culture     Status: None   Collection Time: 2015-03-28  9:33 PM  Result Value Ref Range Status   Specimen Description URINE, CLEAN CATCH  Final   Special Requests NONE  Final   Culture 4,000 COLONIES/mL INSIGNIFICANT GROWTH  Final   Report Status 03/07/2015 FINAL  Final  Blood culture (routine x 2)     Status: None   Collection Time: 03-28-15  9:45 PM  Result Value Ref Range Status   Specimen Description BLOOD LEFT ARM  Final   Special Requests BOTTLES DRAWN AEROBIC AND ANAEROBIC 5CC  Final   Culture NO GROWTH 5 DAYS  Final   Report Status 03/11/2015 FINAL  Final  Blood culture (routine x 2)     Status: None   Collection Time: Mar 28, 2015  9:50 PM  Result Value Ref Range Status   Specimen Description BLOOD RIGHT ARM  Final   Special Requests BOTTLES DRAWN AEROBIC AND ANAEROBIC 5CC  Final   Culture NO GROWTH 5 DAYS  Final   Report Status 03/11/2015 FINAL  Final  Rapid strep screen     Status: None   Collection Time: 03/28/15 10:39 PM  Result Value Ref Range Status   Streptococcus, Group A Screen (Direct) NEGATIVE NEGATIVE Final    Comment: (NOTE) A Rapid Antigen test may result negative if the antigen level in the sample is below the detection level of this test. The FDA has not cleared this test as a stand-alone test therefore the rapid antigen negative result has reflexed to a Group A Strep culture.   Culture, group A strep     Status: None   Collection Time: 2015/03/28 10:39 PM  Result Value Ref Range Status   Specimen Description THROAT  Final   Special Requests Immunocompromised   Final   Culture NO GROUP A STREP (S.PYOGENES) ISOLATED  Final   Report Status 03/08/2015 FINAL  Final  Culture, body fluid-bottle     Status: None (Preliminary result)   Collection Time: 03/06/15  1:40 AM  Result Value Ref Range Status   Specimen Description FLUID PLEURAL  Final   Special Requests BOTTLES DRAWN AEROBIC AND ANAEROBIC 10CC ADD  Final   Culture NO GROWTH 4 DAYS  Final   Report Status PENDING  Incomplete  Gram stain     Status: None   Collection Time: 03/06/15  1:40 AM  Result Value Ref Range Status   Specimen Description FLUID PLEURAL  Final   Special Requests NONE  Final   Gram Stain   Final    CYTOSPIN SMEAR WBC PRESENT, PREDOMINANTLY MONONUCLEAR NO ORGANISMS SEEN    Report Status 03/07/2015 FINAL  Final  MRSA PCR Screening     Status: None   Collection Time: 03/06/15  9:03 AM  Result Value Ref Range Status   MRSA by PCR NEGATIVE NEGATIVE Final    Comment:        The GeneXpert MRSA Assay (FDA approved for NASAL specimens only), is one component of a comprehensive MRSA colonization surveillance program. It is not intended to diagnose MRSA infection nor to guide or monitor treatment for MRSA infections.      Medications:   . sodium chloride   Intravenous Once  . albumin human  12.5 g Intravenous 4 times per day  . antiseptic oral rinse  7 mL Mouth Rinse q12n4p  . azaTHIOprine  25 mg Oral BID  . chlorhexidine  15 mL Mouth Rinse BID  . hydrocortisone sod succinate (SOLU-CORTEF) inj  50 mg Intravenous Daily  . lactulose  30 g Oral Q6H  . midodrine  7.5 mg Oral TID WC  . nystatin  5 mL Mouth/Throat QID  . pantoprazole (PROTONIX) IV  40 mg Intravenous Q12H  . rifaximin  550 mg Oral BID  . sodium chloride flush  3 mL Intravenous Q12H  . ursodiol  300 mg Oral BID   Continuous Infusions: . dextrose 5 % and 0.9% NaCl 50 mL/hr at 03/12/15 0800  . octreotide  (SANDOSTATIN)    IV infusion 50 mcg/hr (03/12/15 4782)    Time spent: 25 min   LOS: 7 days    Marinda Elk  [facility] Pager [phone]  *Please refer to [email or web address], password [email or web address] to get updated schedule on who will round on this patient, as hospitalists switch teams weekly. If 7PM-7AM, please contact night-coverage at [email or web address], password [email or web address] for any overnight needs.  03/12/2015, 10:42 AM

## 2015-03-13 DIAGNOSIS — Z789 Other specified health status: Secondary | ICD-10-CM

## 2015-03-13 LAB — BASIC METABOLIC PANEL
ANION GAP: 12 (ref 5–15)
BUN: 55 mg/dL — ABNORMAL HIGH (ref 6–20)
CALCIUM: 11.1 mg/dL — AB (ref 8.9–10.3)
CHLORIDE: 125 mmol/L — AB (ref 101–111)
CO2: 15 mmol/L — AB (ref 22–32)
Creatinine, Ser: 1.87 mg/dL — ABNORMAL HIGH (ref 0.44–1.00)
GFR calc non Af Amer: 37 mL/min — ABNORMAL LOW (ref >60)
GFR, EST AFRICAN AMERICAN: 43 mL/min — AB (ref >60)
Glucose, Bld: 152 mg/dL — ABNORMAL HIGH (ref 65–99)
POTASSIUM: 4 mmol/L (ref 3.5–5.1)
Sodium: 152 mmol/L — ABNORMAL HIGH (ref 135–145)

## 2015-03-13 LAB — CBC WITH DIFFERENTIAL/PLATELET
BASOS ABS: 0 10*3/uL (ref 0.0–0.1)
Basophils Relative: 0 %
EOS ABS: 0 10*3/uL (ref 0.0–0.7)
Eosinophils Relative: 0 %
HEMATOCRIT: 18.9 % — AB (ref 36.0–46.0)
Hemoglobin: 6.2 g/dL — CL (ref 12.0–15.0)
LYMPHS ABS: 0.1 10*3/uL — AB (ref 0.7–4.0)
Lymphocytes Relative: 2 %
MCH: 33.2 pg (ref 26.0–34.0)
MCHC: 32.8 g/dL (ref 30.0–36.0)
MCV: 101.1 fL — ABNORMAL HIGH (ref 78.0–100.0)
MONOS PCT: 2 %
Monocytes Absolute: 0.1 10*3/uL (ref 0.1–1.0)
NEUTROS ABS: 4.4 10*3/uL (ref 1.7–7.7)
Neutrophils Relative %: 96 %
Platelets: 31 10*3/uL — ABNORMAL LOW (ref 150–400)
RBC: 1.87 MIL/uL — ABNORMAL LOW (ref 3.87–5.11)
RDW: 22 % — AB (ref 11.5–15.5)
WBC: 4.6 10*3/uL (ref 4.0–10.5)

## 2015-03-13 LAB — MAGNESIUM: Magnesium: 2 mg/dL (ref 1.7–2.4)

## 2015-03-13 LAB — PHOSPHORUS: Phosphorus: 5.8 mg/dL — ABNORMAL HIGH (ref 2.5–4.6)

## 2015-03-13 LAB — AMMONIA: AMMONIA: 180 umol/L — AB (ref 9–35)

## 2015-03-13 LAB — PROTIME-INR
INR: 8.95 (ref 0.00–1.49)
Prothrombin Time: 69.7 seconds — ABNORMAL HIGH (ref 11.6–15.2)

## 2015-03-13 LAB — PREPARE RBC (CROSSMATCH)

## 2015-03-13 MED ORDER — SODIUM CHLORIDE 0.9 % IV SOLN
25.0000 ug/h | INTRAVENOUS | Status: DC
  Filled 2015-03-13: qty 25

## 2015-03-13 MED ORDER — LORAZEPAM 2 MG/ML IJ SOLN: 1.0000 mg | INTRAMUSCULAR | Status: DC | PRN

## 2015-03-13 MED ORDER — MORPHINE SULFATE (PF) 2 MG/ML IV SOLN
2.0000 mg | INTRAVENOUS | Status: DC | PRN
  Administered 2015-03-13: 2 mg via INTRAVENOUS

## 2015-03-13 MED ORDER — MORPHINE SULFATE 25 MG/ML IV SOLN
1.0000 mg/h | INTRAVENOUS | Status: DC
  Filled 2015-03-13: qty 10

## 2015-03-13 MED ORDER — SODIUM CHLORIDE 0.9 % IV SOLN: Freq: Once | INTRAVENOUS | Status: DC

## 2015-03-13 MED ORDER — MORPHINE SULFATE (PF) 2 MG/ML IV SOLN
2.0000 mg | INTRAVENOUS | Status: DC | PRN
  Administered 2015-03-13: 2 mg via INTRAVENOUS
  Filled 2015-03-13: qty 1

## 2015-03-13 MED ORDER — FENTANYL CITRATE (PF) 100 MCG/2ML IJ SOLN
25.0000 ug | INTRAMUSCULAR | Status: DC | PRN
  Administered 2015-03-14 – 2015-03-15 (×8): 25 ug via INTRAVENOUS

## 2015-03-13 MED ORDER — LORAZEPAM 2 MG/ML IJ SOLN
1.0000 mg | INTRAMUSCULAR | Status: DC | PRN
  Administered 2015-03-14 – 2015-03-15 (×3): 2 mg via INTRAVENOUS
  Filled 2015-03-13 (×3): qty 1

## 2015-03-13 MED ORDER — METOPROLOL TARTRATE 1 MG/ML IV SOLN
INTRAVENOUS | Status: AC
  Filled 2015-03-13: qty 5

## 2015-03-13 MED ORDER — FENTANYL CITRATE (PF) 2500 MCG/50ML IJ SOLN
25.0000 ug/h | INTRAMUSCULAR | Status: DC
  Administered 2015-03-13 – 2015-03-14 (×6): 25 ug/h via INTRAVENOUS
  Administered 2015-03-15: 100 ug/h via INTRAVENOUS
  Administered 2015-03-15: 80 ug/h via INTRAVENOUS
  Filled 2015-03-13 (×2): qty 50

## 2015-03-13 MED ORDER — METOPROLOL TARTRATE 1 MG/ML IV SOLN
5.0000 mg | Freq: Once | INTRAVENOUS | Status: AC
Start: 2015-03-13 — End: 2015-03-13
  Administered 2015-03-13: 5 mg via INTRAVENOUS

## 2015-03-13 MED ORDER — FENTANYL CITRATE (PF) 100 MCG/2ML IJ SOLN: 25.0000 ug | INTRAMUSCULAR | Status: DC | PRN

## 2015-03-13 MED ORDER — SODIUM CHLORIDE 0.45 % IV SOLN
INTRAVENOUS | Status: DC
  Administered 2015-03-13: 17:00:00 via INTRAVENOUS

## 2015-03-13 MED ORDER — FUROSEMIDE 10 MG/ML IJ SOLN
60.0000 mg | Freq: Once | INTRAMUSCULAR | Status: AC
  Administered 2015-03-13: 60 mg via INTRAVENOUS
  Filled 2015-03-13: qty 6

## 2015-03-13 MED ORDER — MORPHINE SULFATE (PF) 2 MG/ML IV SOLN
INTRAVENOUS | Status: AC
Start: 2015-03-13 — End: 2015-03-14
  Filled 2015-03-13: qty 1

## 2015-03-13 MED ORDER — METOPROLOL TARTRATE 1 MG/ML IV SOLN: 10.0000 mg | Freq: Once | INTRAVENOUS | Status: AC

## 2015-03-13 NOTE — Progress Notes (Signed)
Unable to place feeding tube related to high INR. Dr Sharon Seller informed.

## 2015-03-13 NOTE — Progress Notes (Signed)
[place] TEAM 1 - Stepdown/ICU TEAM PROGRESS NOTE  Melanie Conley WUJ:[id number] DOB: Jan 24, 1991 DOA: 2015-03-22 PCP: Woodfin Ganja, MD  Admit HPI / Brief Narrative: 25 y.o. 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 Patient is followed by [facility] gastroenterology and was discharged from that institution on 03/02/2015 following treatment for SBP. She initially did okay back at home until developing fever and severe abdominal pain acutely on 2015-03-22. Her adoptive mother states Melanie Conley has had a difficult course recently with serial hospital admissions related to cirrhosis. Complications have included multiple courses of SBP, acute blood loss from esophageal varices, and hepatic encephalopathy. Per [facility] notes, she was apparently on the pediatric transplant list but was never able to be transitioned to the adult last due to an inability to pass the psychosocial screening. She has been managed with prophylactic Bactrim, rifaximin, lactulose, prednisone, Imuran, and ursodiol. She's had numerous banding procedures for her varices. There's been no recent hematemesis, melena, or hematochezia.  In ED, patient was found to have oral temperature 37.9 C, sinus tachycardia in the 110s, and blood pressure in the 100/50 range. Her abdomen was noted to be distended and tender. Paracentesis was performed. She was started on empiric cefepime and vancomycin for presumed SBP. Blood work returned notable for sodium of 119, BUN of 42, serum creatinine of 3.0, total bilirubin >50, and INR of 3.9. Creatinine was 0.09 at time of discharge from [facility] on 03/02/2015. She had large volume paracentesis performed during that admission. 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   HPI/Subjective: Melanie Conley appears to be in a  state of decline.  She is unresponsive and unable to communicate with me in a effective fashion at this time.  There is no family present.  The palliative care M.D. has attempted to discuss the gravity of her current illness with her mother but has detailed in her note she has thus far not been able to make any headway.  Records indicate the patient has been turned down for liver transplant.  That being the case, she has an incurable disease which has now progressed to end-stage with no curative treatment available to her.  No matter what we do it is clear that she will not survive this hospital stay.  I hope to be able to discuss this with the patient's mother in person as she has informed staff she is on her way to visit the patient.  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  - Has progressed to end-stage; pt not able to pass psychosocial screening to be placed on transplant list per [facility] notes  - Has worsened significantly since discharge from [facility] just 3 days prior, and continues to actively decline presently looking much worse than when I saw her approximately 5 days ago - MELD now 52+, corresponding to 100% 3 mo mortality   - Has known esophageal varices with history of bleeding and multiple  banding procedures; no hematemesis, melena or hematochezia now; BP cannot tolerate propranolol  Hepatic encephalopathy  - ammonia elevated at 180 - not felt to be safe to place NG tube given hx of varices and recent bleeding   Severe chronic Hyponatremia  - Na nadir of 119 this admit - now  hypernatremic   Coagulopathy of liver dz w/ thrombocytopenia  - INR severely elevated reflecting further decline in liver fxn/fulminant hepatic failure   Acute kidney injury concerning for hepatorenal syndrome  - Discharged from [facility] on 03/02/15 with SCr of 0.09 - 3.20 at time of this admit w/ peak at 3.43 - treated as HRS for with midodrine 7.5 TID, octreotide infusion, and albumin 12.5 g QID  - renal function has only modestly improved   Severe Hypokalemia  - replaced  Hyperglycemia  - no reported hx of DM - A1c 4.1   Normocytic anemia  - no evidence of acute blood loss - suspect "anemia of chronic disease" compounded by acute kidney injury - further transfusion is inhumane in my opinion, as pt is clearly dying from fulminant hepatic failure   Twitching - no further twitching today   Goals of care  - Dr. Joseph Art participated in and extremely long meeting with the pt's mother on 03/07/15 during which he explained patient's very poor overall prognosis - I  spoke with the patient's mother via phone 03/13/15 and explained to her in very clear terms that there was "nothing else we could do for her daughter" and that I felt firmly that "no matter what we did she was going to die from liver failure very soon" - the mother stated she was told at [facility] that she had "at least 3 months"  - she seems to be coming around to the realization that her daughter is not going to survive this hospital stay   Code Status: FULL Family Communication: no family present at time of exam Disposition Plan:  transitioning to full comfort care in my opinion is the only appropriate treatment option - the patient's mother is on her way to the hospital and I will discuss this further with her in person   Consultants: Palliative Care   Procedures: Diagnostic paracentesis - 2/5   Antibiotics: Cefepime 2/5 > 2/11 Vancomycin 2/5 > 2/9  DVT prophylaxis: SCDs  Objective: Blood pressure 150/102, pulse 100,  temperature 98.6 F (37 C), temperature source Oral, resp. rate 32, height  (1.499 m), weight 51 kg (112 lb 7 oz), SpO2 90 %.  Intake/Output Summary (Last 24 hours) at 03/13/15 1520 Last data filed at 03/13/15 1300  Gross per 24 hour  Intake 2588.33 ml  Output    200 ml  Net 2388.33 ml   Exam: General:  the patient is obtunded and severely tachypneic  Lungs: poor air movement throughout all fields with rapid shallow breathing  Cardiovascular:  tachycardic but regular  Abdomen: distended, positive ascites appreciable on exam, unresponsive to rebound testing  Extremities: No significant cyanosis, clubbing, or edema bilateral lower extremities - cachectic   Data Reviewed:  Basic Metabolic Panel:  Recent Labs Lab 03/09/15 0420 03/10/15 0529 03/11/15 0244 03/12/15 0558 03/13/15 0258  NA 134* 141 145 151* 152*  K 3.5 3.4* 3.5 3.1* 4.0  CL 101 112* 116* 121* 125*  CO2 18* 16* 16* 16* 15*  GLUCOSE 131* 141* 155* 163* 152*  BUN 42* 43* 48* 49* 55*  CREATININE 2.58* 2.35* 2.09* 1.94* 1.87*  CALCIUM 9.4 10.0 10.3 11.0* 11.1*  MG  2.1 2.1 2.0 2.1 2.0  PHOS 4.3 4.3 6.1* 5.0* 5.8*   CBC:  Recent Labs Lab 03/09/15 0420 03/10/15 0529 03/10/15 1443 03/11/15 0244 03/12/15 0558 03/13/15 0258  WBC 2.4* 4.3 6.6 4.8 4.1 4.6  NEUTROABS 2.0 3.9  --  4.6 3.9 4.4  HGB 7.0* 6.3* 8.9* 7.6* 7.1* 6.2*  HCT 20.2* 18.6* 25.8* 22.7* 21.2* 18.9*  MCV 98.5 100.5* 97.0 97.4 99.5 101.1*  PLT 63* 69* 60* 56* 40* 31*   Liver Function Tests:  Recent Labs Lab 03/08/15 0250 03/09/15 0420 03/10/15 0529 03/11/15 0244 03/12/15 0558  AST 37 42* 40 25 39  ALT 19 28 16 22 23   ALKPHOS 29* 23* 16* 17* 16*  BILITOT >50.0* >50.0* >50.0* >50.0* >50.0*  PROT 5.6* 5.7* 5.8* 6.1* 6.3*  ALBUMIN 3.4* 3.6 3.9 4.2 4.3    Recent Labs Lab 03/09/15 0420 03/10/15 0930 03/11/15 0244 03/12/15 0558 03/13/15 0325  AMMONIA 60* 63* 142* 131* 180*    Coags:  Recent Labs Lab 03/10/15 0529  03/11/15 0244 03/12/15 0558 03/13/15 0258  INR 7.64* 6.64* 7.42* 8.95*   CBG:  Recent Labs Lab 03/11/15 2355 03/12/15 0748 03/12/15 1311 03/12/15 1926 03/12/15 2323  GLUCAP 162* 144* 143* 122* 132*    Recent Results (from the past 240 hour(s))  Urine culture     Status: None   Collection Time: 03/30/15  9:33 PM  Result Value Ref Range Status   Specimen Description URINE, CLEAN CATCH  Final   Special Requests NONE  Final   Culture 4,000 COLONIES/mL INSIGNIFICANT GROWTH  Final   Report Status 03/07/2015 FINAL  Final  Blood culture (routine x 2)     Status: None   Collection Time: 03/30/15  9:45 PM  Result Value Ref Range Status   Specimen Description BLOOD LEFT ARM  Final   Special Requests BOTTLES DRAWN AEROBIC AND ANAEROBIC 5CC  Final   Culture NO GROWTH 5 DAYS  Final   Report Status 03/11/2015 FINAL  Final  Blood culture (routine x 2)     Status: None   Collection Time: Mar 30, 2015  9:50 PM  Result Value Ref Range Status   Specimen Description BLOOD RIGHT ARM  Final   Special Requests BOTTLES DRAWN AEROBIC AND ANAEROBIC 5CC  Final   Culture NO GROWTH 5 DAYS  Final   Report Status 03/11/2015 FINAL  Final  Rapid strep screen     Status: None   Collection Time: 03/30/15 10:39 PM  Result Value Ref Range Status   Streptococcus, Group A Screen (Direct) NEGATIVE NEGATIVE Final    Comment: (NOTE) A Rapid Antigen test may result negative if the antigen level in the sample is below the detection level of this test. The FDA has not cleared this test as a stand-alone test therefore the rapid antigen negative result has reflexed to a Group A Strep culture.   Culture, group A strep     Status: None   Collection Time: March 30, 2015 10:39 PM  Result Value Ref Range Status   Specimen Description THROAT  Final   Special Requests Immunocompromised  Final   Culture NO GROUP A STREP (S.PYOGENES) ISOLATED  Final   Report Status 03/08/2015 FINAL  Final  Culture, body fluid-bottle      Status: None   Collection Time: 03/06/15  1:40 AM  Result Value Ref Range Status   Specimen Description FLUID PLEURAL  Final   Special Requests BOTTLES DRAWN AEROBIC AND ANAEROBIC 10CC ADD  Final   Culture NO GROWTH  5 DAYS  Final   Report Status 03/12/2015 FINAL  Final  Gram stain     Status: None   Collection Time: 03/06/15  1:40 AM  Result Value Ref Range Status   Specimen Description FLUID PLEURAL  Final   Special Requests NONE  Final   Gram Stain   Final    CYTOSPIN SMEAR WBC PRESENT, PREDOMINANTLY MONONUCLEAR NO ORGANISMS SEEN    Report Status 03/07/2015 FINAL  Final  MRSA PCR Screening     Status: None   Collection Time: 03/06/15  9:03 AM  Result Value Ref Range Status   MRSA by PCR NEGATIVE NEGATIVE Final    Comment:        The GeneXpert MRSA Assay (FDA approved for NASAL specimens only), is one component of a comprehensive MRSA colonization surveillance program. It is not intended to diagnose MRSA infection nor to guide or monitor treatment for MRSA infections.      Studies:   Recent x-ray studies have been reviewed in detail by the Attending Physician  Scheduled Meds:  Scheduled Meds: . sodium chloride   Intravenous Once  . sodium chloride   Intravenous Once  . albumin human  12.5 g Intravenous 4 times per day  . antiseptic oral rinse  7 mL Mouth Rinse q12n4p  . azaTHIOprine  25 mg Oral BID  . chlorhexidine  15 mL Mouth Rinse BID  . fluconazole (DIFLUCAN) IV  100 mg Intravenous Q24H  . hydrocortisone sod succinate (SOLU-CORTEF) inj  50 mg Intravenous Daily  . lactulose  30 g Oral Q6H  . midodrine  7.5 mg Oral TID WC  . nystatin  5 mL Mouth/Throat QID  . pantoprazole (PROTONIX) IV  40 mg Intravenous Q12H  . rifaximin  550 mg Oral BID  . sodium chloride flush  3 mL Intravenous Q12H  . ursodiol  300 mg Oral BID    Time spent on care of this patient: 35 mins   Tyniya Kuyper T , MD   [facility] Office  [phone] Pager - Text Page per  Loretha Stapler as per below:  On-Call/Text Page:      [email or web address]      password [email or web address]  If 7PM-7AM, please contact night-coverage [email or web address] Password [email or web address] 03/13/2015, 3:20 PM   LOS: 8 days

## 2015-03-13 NOTE — Progress Notes (Signed)
Brief Nutrition Follow-Up Note  RD received consult for cortrak tube placement. Cortrak tube team aware of consult.   Reviewed PCT note from 03/13/15; pt with poor prognosis, to meet with PCT at 1330 today. RD will make further recommendations based on GOC discussions. Refer to RD note on 03/09/15 regarding TF recommendations. Pt has been NPO x 6 days.   Melanie Conley A. Mayford Knife, RD, LDN, CDE Pager: [phone] After hours Pager: [phone]

## 2015-03-13 NOTE — Progress Notes (Signed)
Pt CBG 122-162 since 03/09/15. Bleeding risk concern communicated to PA, who said that she would report to MD during transition to day shift. Did not want to discontinue order in case CBGs were needed. Will pass along to RN at shift change.

## 2015-03-13 NOTE — Progress Notes (Signed)
Patient in severe respiratory distress, sats in the mid 80s and heart rate now in the 130's. Dr Sharon Seller at bedside speaking with patients mother. Orders for morphine and lopressor received. Will continue to monitor closely.

## 2015-03-13 NOTE — Progress Notes (Signed)
Called and spoke with Eben Burow, patient's mother, I attempted to discuss her goals of care by phone give how fragile and distressed Swathi looks on my visit this afternoon. I asked her directly about DNR- her response is that no one has talked to her about that-so she has very poor understanding of Dorla's condition as our team alone has addressed this on multiple occasions. I suspect complex denial. I recommended DNR and that we not put Angelise through pain and suffering in the setting of her terminal illness-there is not a reversible illness and I shared with her mother my ethical concerns for resuscitation that situation. Mother says she will be here in another hour- I have visited the unit X2 after being told she was en route. Discussed with RN, palliative team availability may be limited this afternoon due to high consult volumes.  Anderson Malta, DO Palliative Medicine [phone]

## 2015-03-13 NOTE — Progress Notes (Signed)
Palliative Care RN note: Dr Phillips Odor will meet with mother at 1330 today. Donn Pierini, RN, BSN, [facility] 03/13/2015 12:00 PM

## 2015-03-13 NOTE — Progress Notes (Signed)
Chaplain was called for support of transitioning Pt. Mother was having difficulty coping. Chaplain visited the family and Pt as they were leaving the room. Chaplain prayed with the family together. They left the room and went to the waiting area. Chaplain asked if they needed any thing and if they needed him any further that they have Chaplain paged    03/13/15 2000  Clinical Encounter Type  Visited With Patient and family together  Visit Type Spiritual support;Critical Care  Referral From Nurse  Spiritual Encounters  Spiritual Needs Prayer;Emotional;Grief support  Stress Factors  Family Stress Factors Loss

## 2015-03-14 DIAGNOSIS — Z515 Encounter for palliative care: Secondary | ICD-10-CM | POA: Insufficient documentation

## 2015-03-14 MED ORDER — GLYCOPYRROLATE 0.2 MG/ML IJ SOLN
0.2000 mg | INTRAMUSCULAR | Status: DC | PRN
  Administered 2015-03-14 – 2015-03-15 (×3): 0.2 mg via INTRAVENOUS
  Filled 2015-03-14 (×5): qty 2

## 2015-03-14 MED ORDER — GLYCOPYRROLATE 0.2 MG/ML IJ SOLN
0.4000 mg | Freq: Three times a day (TID) | INTRAMUSCULAR | Status: DC
Start: 2015-03-14 — End: 2015-03-16
  Administered 2015-03-14 – 2015-03-15 (×4): 0.4 mg via INTRAVENOUS
  Filled 2015-03-14 (×6): qty 2

## 2015-03-14 NOTE — Progress Notes (Signed)
[place] TEAM 1 - Stepdown/ICU TEAM Progress Note  MANAIA SAMAD IFO:[id number] DOB: 12-24-1990 DOA: 03/18/2015 PCP: Rayvon Char, MD  Admit HPI / Brief Narrative: 25 y.o. 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 Patient is typically followed by [facility] gastroenterology and was just discharged from that institution on 03/02/2015 following treatment for SBP. She initially did okay back at home until developing fever and severe abdominal pain earlier today. Her adoptive mother is at the bedside and assists with the history. Ms. Tryon has had a difficult course recently with serial hospital admissions related to cirrhosis. Complications have included multiple courses of SBP, acute blood loss from esophageal varices, and hepatic encephalopathy. Per [facility] notes, she was apparently on the pediatric transplant list but was never able to be transitioned to the adult last due to an inability to pass the psychosocial screening. She has been managed with prophylactic Bactrim, rifaximin, lactulose, prednisone, Imuran, and ursodiol. She's had numerous banding procedures for her varices. There's been no recent hematemesis, melena, or hematochezia.  In ED, patient was found to have oral temperature 37.9 C, sinus tachycardia in the 110s, and blood pressure in the 100/50 range. Her abdomen was noted to be distended and tender, raising concern for SBP. Paracentesis was performed and fluid seent for analysis. She was started on empiric cefepime and vancomycinfor presumed SBP. Blood work returned notable for sodium of 119, BUN of 42, serum creatinine of 3.0, total bilirubin >50, and INR of 3.9. Serum creatinine was apparently 0.09 at time of discharge from [facility] on 03/02/2015. She had large volume paracentesis performed during that admission but it is unclear if albumin was used to minimize fluid shifts. Pain was controlled  with IV hydromorphone in the emergency department, the patient remained hemodynamically stable, and will be admitted to the stepdown unit for ongoing evaluation and management of end-stage liver disease with suspected SBP and concern for development of hepatorenal syndrome.   HPI/Subjective: 2/14  A/O. family has met with Dr. Rhea Pink palliative care several times over the weekend and have agreed to comfort care.  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  - Has progressed to end-stage; pt not able to pass psychosocial screening to be placed on transplant list per [facility] notes  - Has worsened significantly since discharge from [facility] just 3 days prior  - MELD now 47, corresponding to estimated 3-mo survival of <5%; this could be improved if renal function recovers  - Has known esophageal varices with history of bleeding and multiple banding procedures; no hematemesis, melena or hematochezia now; BP cannot tolerate propranolol - Has history of hepatic encephalopathy, continuing lactulose and rifaximin  - Comfort care  Hepatic encephalopathy/altered mental status -Most likely multifactorial to include elevated ammonia, uremia, anemia. - ammonia 131--> 60 - Comfort care  Severe chronic Hyponatremia  - Na nadir of 119 this admit - improving at acceptable rate   Coagulopathy of liver dz w/ thrombocytopenia  - INR 3.9 - no evidence of spontaneous blood loss -  Comfort care  Acute kidney injury concerning for hepatorenal syndrome  - Discharged from [facility] on 03/02/15 with SCr of  0.09, now 6.04  - Uncertain if this is AKI from SBP with sepsis, but more concerned for development of hepatorenal syndrome  - Had lg-vol para at [facility], uncertain if albumin was given to minimize fluid shift as this could have precipitated hepatorenal  -- Comfort care  Severe Hypokalemia  - replace and follow  Hyperglycemia  - no reported hx of DM - check A1c - follow   Normocytic anemia/Acute Blood Loss Anemia?  - no evidence of acute blood loss - suspect "anemia of chronic disease" compounded by acute kidney injury -  -Occult blood - Comfort care   Oropharyngeal candidiasis  - Comfort care       Goals of care  -Extremely looooong meeting with mother and which I explained patient's Poor overall prognosis - Mother now beginning to understand severity of disease process but still not willing to consider Hospice    Code Status: FULL Family Communication: Mother present at time of exam Disposition Plan: Transfer [facility] when bed available    Consultants: [facility] Palliative Care Dr. Rhea Pink palliative care    Procedure/Significant Events: NA   Culture 2/5 urine insignificant growth 2/5 blood left/right arm NGTD 2/6 pleural fluid NGTD 2/6 MRSA by PCR negative   Antibiotics: Cefepime 2/5>> 2/11 Vancomycin 2/5>> 2/9   DVT prophylaxis: Subcutaneous heparin   Devices    LINES / TUBES:      Continuous Infusions: . sodium chloride 10 mL/hr at 03/13/15 1656  . fentaNYL infusion INTRAVENOUS 25 mcg/hr (03/13/15 1957)    Objective: VITAL SIGNS: BP: 117/81 mmHg (02/14 0000) Pulse Rate: 93 (02/14 0400) SPO2; FIO2:   Intake/Output Summary (Last 24 hours) at 03/14/15 0852 Last data filed at 03/14/15 0400  Gross per 24 hour  Intake 1082.3 ml  Output      0 ml  Net 1082.3 ml     Exam: General: A/O  0  Lungs: tachypnea, mild paradoxical breathing with rhonchi  Cardiovascular: Tachycardic Regular rhythm without murmur gallop or rub  normal S1 and S2   Data Reviewed: Basic Metabolic Panel:  Recent Labs Lab 03/09/15 0420 03/10/15 0529 03/11/15 0244 03/12/15 0558 03/13/15 0258  NA 134* 141 145 151* 152*  K 3.5 3.4* 3.5 3.1* 4.0  CL 101 112* 116* 121* 125*  CO2 18* 16* 16* 16* 15*  GLUCOSE 131* 141* 155* 163* 152*  BUN 42* 43* 48* 49* 55*  CREATININE 2.58* 2.35* 2.09* 1.94* 1.87*  CALCIUM 9.4 10.0 10.3 11.0* 11.1*  MG 2.1 2.1 2.0 2.1 2.0  PHOS 4.3 4.3 6.1* 5.0* 5.8*   Liver Function Tests:  Recent Labs Lab 03/08/15 0250 03/09/15 0420 03/10/15 0529 03/11/15 0244 03/12/15 0558  AST 37 42* 40 25 39  ALT '19 28 16 22 23  ' ALKPHOS 29* 23* 16* 17* 16*  BILITOT >50.0* >50.0* >50.0* >50.0* >50.0*  PROT 5.6* 5.7* 5.8* 6.1* 6.3*  ALBUMIN 3.4* 3.6 3.9 4.2 4.3   No results for input(s): LIPASE, AMYLASE in the last 168 hours.  Recent Labs Lab 03/09/15 0420 03/10/15 0930 03/11/15 0244 03/12/15 0558 03/13/15 0325  AMMONIA 60* 63* 142* 131* 180*   CBC:  Recent Labs Lab 03/09/15 0420 03/10/15 0529 03/10/15 1443 03/11/15 0244 03/12/15 0558 03/13/15 0258  WBC 2.4* 4.3 6.6 4.8 4.1 4.6  NEUTROABS 2.0 3.9  --  4.6 3.9 4.4  HGB 7.0* 6.3* 8.9* 7.6* 7.1* 6.2*  HCT 20.2* 18.6* 25.8* 22.7* 21.2* 18.9*  MCV 98.5 100.5* 97.0 97.4 99.5 101.1*  PLT 63* 69* 60* 56* 40* 31*   Cardiac Enzymes: No results for input(s): CKTOTAL, CKMB, CKMBINDEX, TROPONINI in the last 168 hours. BNP (last 3 results)  Recent Labs  03/06/15 0335  BNP 17.4    ProBNP (last 3 results) No results for input(s): PROBNP in the last 8760 hours.  CBG:  Recent Labs Lab 03/11/15 2355 03/12/15 0748 03/12/15 1311 03/12/15 1926 03/12/15 2323  GLUCAP 162* 144* 143* 122* 132*    Recent Results (from the past 240 hour(s))  Urine culture     Status: None   Collection Time: 03/18/2015  9:33 PM  Result Value Ref Range Status   Specimen Description URINE, CLEAN CATCH  Final   Special Requests NONE  Final   Culture 4,000  COLONIES/mL INSIGNIFICANT GROWTH  Final   Report Status 03/07/2015 FINAL  Final  Blood culture (routine x 2)     Status: None   Collection Time: 03/27/2015  9:45 PM  Result Value Ref Range Status   Specimen Description BLOOD LEFT ARM  Final   Special Requests BOTTLES DRAWN AEROBIC AND ANAEROBIC 5CC  Final   Culture NO GROWTH 5 DAYS  Final   Report Status 03/11/2015 FINAL  Final  Blood culture (routine x 2)     Status: None   Collection Time: 03/20/2015  9:50 PM  Result Value Ref Range Status   Specimen Description BLOOD RIGHT ARM  Final   Special Requests BOTTLES DRAWN AEROBIC AND ANAEROBIC 5CC  Final   Culture NO GROWTH 5 DAYS  Final   Report Status 03/11/2015 FINAL  Final  Rapid strep screen     Status: None   Collection Time: 03/10/2015 10:39 PM  Result Value Ref Range Status   Streptococcus, Group A Screen (Direct) NEGATIVE NEGATIVE Final    Comment: (NOTE) A Rapid Antigen test may result negative if the antigen level in the sample is below the detection level of this test. The FDA has not cleared this test as a stand-alone test therefore the rapid antigen negative result has reflexed to a Group A Strep culture.   Culture, group A strep     Status: None   Collection Time: 03/01/2015 10:39 PM  Result Value Ref Range Status   Specimen Description THROAT  Final   Special Requests Immunocompromised  Final   Culture NO GROUP A STREP (S.PYOGENES) ISOLATED  Final   Report Status 03/08/2015 FINAL  Final  Culture, body fluid-bottle     Status: None   Collection Time: 03/06/15  1:40 AM  Result Value Ref Range Status   Specimen Description FLUID PLEURAL  Final   Special Requests BOTTLES DRAWN AEROBIC AND ANAEROBIC 10CC ADD  Final   Culture NO GROWTH 5 DAYS  Final   Report Status 03/12/2015 FINAL  Final  Gram stain     Status: None   Collection Time: 03/06/15  1:40 AM  Result Value Ref Range Status   Specimen Description FLUID PLEURAL  Final   Special Requests NONE  Final   Gram Stain    Final    CYTOSPIN SMEAR WBC PRESENT, PREDOMINANTLY MONONUCLEAR NO ORGANISMS SEEN    Report Status 03/07/2015 FINAL  Final  MRSA PCR Screening     Status: None   Collection Time: 03/06/15  9:03 AM  Result Value Ref Range Status   MRSA by PCR NEGATIVE NEGATIVE Final    Comment:  The GeneXpert MRSA Assay (FDA approved for NASAL specimens only), is one component of a comprehensive MRSA colonization surveillance program. It is not intended to diagnose MRSA infection nor to guide or monitor treatment for MRSA infections.      Studies:  Recent x-ray studies have been reviewed in detail by the Attending Physician  Scheduled Meds:  Scheduled Meds: . antiseptic oral rinse  7 mL Mouth Rinse q12n4p  . chlorhexidine  15 mL Mouth Rinse BID  . sodium chloride flush  3 mL Intravenous Q12H    Time spent on care of this patient: 40 mins   Starleen Trussell, Geraldo Docker , MD  [facility] Office  [phone] Pager - [phone]  On-Call/Text Page:      [email or web address]      password [email or web address]  If 7PM-7AM, please contact night-coverage [email or web address] Password [email or web address] 03/14/2015, 8:52 AM   LOS: 9 days   Care during the described time interval was provided by me .  I have reviewed this patient's available data, including medical history, events of note, physical examination, and all test results as part of my evaluation. I have personally reviewed and interpreted all radiology studies.   Dia Crawford, MD [phone] Pager

## 2015-03-14 NOTE — Progress Notes (Signed)
Patient seen and examined and discussed in detail with Dr. Sharon Seller who conducted family meeting and goals of care discussion regarding Melanie Conley's condition. Melanie Conley appears to be actively dying of terminal liver disease/autoimmune hepatitis- issues maintaining her oxygen saturations and probable variceal bleeding evident on exam. Her mother was called in yesterday multiple times and arrived to the room late in the afternoon - after consideration Melanie Conley was made DNR and transitioned to comfort care. She needs a high level of attention to her distress and also to high probabilty that she may develop brisk bleeding variceal that will need attention at EOL. Will start her on a fentanyl infusion with bolus dosing and palliative PRNs for any symptoms that may occur.   On exam she has asterixis, she is unresponsive, respirations are labored and rapid.  Time: 7PM-735PM Greater than 50%  of this time was spent counseling and coordinating care related to the above assessment and plan.  Our team will continue to follow closely and support- Chaplain consult placed for support.  Anderson Malta, DO Palliative Medicine [phone]

## 2015-03-14 NOTE — Progress Notes (Signed)
Nutrition Brief Note  Chart reviewed. Pt now transitioning to comfort care.  No further nutrition interventions warranted at this time.  Please re-consult as needed.   Alyrica Thurow A. Lachlyn Vanderstelt, RD, LDN, CDE Pager: [phone] After hours Pager: [phone]  

## 2015-03-14 NOTE — Progress Notes (Signed)
Upon trying to place DNR bracelet on patient . Mother states " I did not agree to a DNR , I don't want her on the respirator, but I want everything else. Patients aunt in the room agreed. After a long discussion and education Dr Phillips Odor and [facility] were notified to see patient and mother re: DNR status. Dr Phillips Odor in to see patient and spoke with the family

## 2015-03-14 NOTE — Progress Notes (Signed)
   03/14/15 1000  Clinical Encounter Type  Visited With Family  Visit Type Spiritual support  Referral From Palliative care team  Spiritual Encounters  Spiritual Needs Other (Comment) (Reassurance about new unit)  Stress Factors  Family Stress Factors Major life changes;Health changes  In speaking to nurses outside, discovered that high likelihood exists of patient being moved to [facility], but mother nervous about a different unit because she has felt so good about the care received on [facility]. Walked mother to [employer], introduced her to member of nursing staff and showed her vacant room, then toured unit and brought her back to [facility]. Mother much reassured and, I think, now hoping transfer will occur.

## 2015-03-14 NOTE — Progress Notes (Signed)
Palliative Medicine RN Note: Met with mother at bedside. Pt is non-responsive. Pending orders for stat labs. Mother requests we not stick patient again. Spoke with Dr Douglass Rivers. Obtained orders for Robinul to manage secretions and to d/c labs. Discussed with mother potential transfer to [facility]; mother verbalized concern that all patients there are dying and that unit is depressing. Chaplain arrived for visit; she walked mother up to see [facility]. Will follow up later today; provided family and nurse with contact information for Palliative Medicine RN. Larina Earthly, RN, BSN, [facility] 03/14/2015 10:33 AM

## 2015-03-14 NOTE — Clinical Documentation Improvement (Signed)
Palliative Care  Can the diagnosis of CKD be further specified?   CKD Stage I - GFR greater than or equal to 90  CKD Stage II - GFR 60-89  CKD Stage III - GFR 30-59  CKD Stage IV - GFR 15-29  CKD Stage V - GFR < 15  ESRD (End Stage Renal Disease)  Other condition  Unable to clinically determine   Supporting Information: : (risk factors, signs and symptoms, diagnostics, treatment) 03/12/15 IM progress note: "Acute on chronic kidney disease concerning for Hepato-renal syndrome." 03/13/15: GFR=41  Please exercise your independent, professional judgment when responding. A specific answer is not anticipated or expected.   Thank You, Cherylann Ratel, RN, BSN Health Information Management [place] [phone]

## 2015-03-14 NOTE — Progress Notes (Addendum)
Palliative Care Daily Progress Note   S: Medical record reviewed, followed up with [name], RN and pt's mother Melanie Conley regarding patient's condition.   B: Code Status: DNR  Advanced Directives: Does patient have an advance directive?: No  Family/Support: Mother Melanie Conley and aunt are present. Melanie Conley reports she is taking off work for this week and next week, as she knows [place] is declining. Additional family arrived after this meeting to provide more support.  Physician's Plan of Care Goal is comfort care.     A: Patient's current condition: obtunded. Had an episode of, per unit RN, projectile bleeding from mouth and nose last night. She bit down on sponges when Dr Phillips Odor attempting mouth care. Pt responds only to pain/discomfort. Respiratory rate 24-30. Fentanyl drip running.   Family is questioning code status. Earlier today, when RN began to place DNR bracelet, Melanie Conley stated she did not agree to DNR and that she wants everything done except intubation. Dr Phillips Odor discussed process of CPR and risk of variceal bleeding with both compressions and intubation. Melanie Conley verbalized understanding and confirmed that she DOES NOT want pt to be coded and does agree with DNR order and full comfort care.  Discharge Planning: expected hospital death within hours to days. At this time, Dr Phillips Odor feels pt is too unstable to transfer to [facility] due to high risk of excessive, uncontrollable bleeding from throat and nose, as this could happen with the jostling that would occur during transport.  R: Support offered at this time.  Palliative Care Team will continue to follow patient.   Donn Pierini  03/14/2015 2:13 PM

## 2015-03-15 MED ORDER — FENTANYL CITRATE (PF) 100 MCG/2ML IJ SOLN
50.0000 ug | INTRAMUSCULAR | Status: DC | PRN
  Administered 2015-03-15 (×6): 50 ug via INTRAVENOUS

## 2015-03-15 MED ORDER — POLYVINYL ALCOHOL 1.4 % OP SOLN
2.0000 [drp] | OPHTHALMIC | Status: DC | PRN
  Administered 2015-03-15: 2 [drp] via OPHTHALMIC
  Filled 2015-03-15: qty 15

## 2015-03-16 LAB — TYPE AND SCREEN
ABO/RH(D): B POS
ANTIBODY SCREEN: NEGATIVE
UNIT DIVISION: 0
Unit division: 0

## 2015-03-29 NOTE — Progress Notes (Signed)
Palliative Care Daily Progress Note   S: Medical record reviewed, followed up with Dr Phillips Odor, mother Sheralyn Boatman, and RN Shanda Bumps regarding patient's condition.   B: Code Status: DNR  Advanced Directives: Does patient have an advance directive?: No  Family/Support: Shontay's mother and sister are at bedside. Physician's Plan of Care Goal is comfort care   A: Patient's current condition non-responsive, even to deep suction.  Family is concerned about pt getting ativan; she does not want Le[place] to get the medication on a routine basis. Discussed indications for prn ativan, including increased work of breathing and the accompanying anxiety. Sheralyn Boatman verbalized that pt did have agitation with increased work of breathing earlier and that she asked for ativan then; she states med is effective. Sheralyn Boatman verbalized fear that ativan is being given to hasten death. Discussed comfort care philosophy and that no interventions are ever aimed at hastening death/ending life; she verbalized understanding, but anticipate she will need continued reinforcement due to emotional and stressful situation. Discharge Planning: anticipate hospital death  R: Support offered at this time.  Palliative Care Team will continue to follow patient.   Donn Pierini

## 2015-03-29 NOTE — Progress Notes (Addendum)
[facility] TEAM 1 - Stepdown/ICU TEAM PROGRESS NOTE  YAZLEEMAR STRASSNER NWG:[id number] DOB: [birth year] DOA: Mar 09, 2015 PCP: Woodfin Ganja, MD  Admit HPI / Brief Narrative: 25 y.o. 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 Patient is followed by [facility] gastroenterology and was discharged from that institution on 03/02/2015 following treatment for SBP. She initially did okay back at home until developing fever and severe abdominal pain acutely on 03/09/15. Her adoptive mother states Ms. Myrie has had a difficult course recently with serial hospital admissions related to cirrhosis. Complications have included multiple courses of SBP, acute blood loss from esophageal varices, and hepatic encephalopathy. Per [facility] notes, she was apparently on the pediatric transplant list but was never able to be transitioned to the adult last due to an inability to pass the psychosocial screening. She has been managed with prophylactic Bactrim, rifaximin, lactulose, prednisone, Imuran, and ursodiol. She's had numerous banding procedures for her varices. There's been no recent hematemesis, melena, or hematochezia.  In ED, patient was found to have oral temperature 37.9 C, sinus tachycardia in the 110s, and blood pressure in the 100/50 range. Her abdomen was noted to be distended and tender. Paracentesis was performed. She was started on empiric cefepime and vancomycin for presumed SBP. Blood work returned notable for sodium of 119, BUN of 42, serum creatinine of 3.0, total bilirubin >50, and INR of 3.9. Creatinine was 0.09 at time of discharge from [facility] on 03/02/2015. She had large volume paracentesis performed during that admission. 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   HPI/Subjective: Family has now embraced DNR/NCB  and comfort focused care.  The patient appears to be resting comfortably.  Her respiratory effort is marginal and inadequate but she does not appear to be uncomfortable because of it.  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  - Has progressed to end-stage; pt not able to pass psychosocial screening to be placed on transplant list per [facility] notes - there is not remaining therapy available to prolong her life further, as she has progressed into 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  - MELD 52+, corresponding to 100% 3 mo mortality   - Has known esophageal varices with history of bleeding and multiple banding procedures  Hepatic encephalopathy   Severe chronic Hyponatremia  - Na nadir of 119 this admit  Coagulopathy of liver dz w/ thrombocytopenia  - INR severely elevated reflecting further decline in liver fxn/fulminant hepatic failure   Acute kidney injury without CKD c/w hepatorenal syndrome  - Discharged from [facility] on 03/02/15 with SCr of 0.09 - 3.20 at time of this admit w/ peak at 3.43 - treated with midodrine 7.5 TID, octreotide infusion, and albumin 12.5 g QID w/o response/improvement   Severe Hypokalemia  - replaced  Hyperglycemia  - no reported hx of DM -  A1c 4.1   Normocytic anemia  - no evidence of acute blood loss - suspect "anemia of chronic disease" compounded by acute kidney injury   Code Status: DNR/NO CODE Family Communication:  Spoke with mother and aunt at bedside Disposition Plan:  full comfort care   Consultants: Palliative Care   Procedures: Diagnostic paracentesis - 2/5   Antibiotics: Cefepime 2/5 > 2/11 Vancomycin 2/5 > 2/9  DVT prophylaxis: SCDs  Objective: Blood pressure 112/69, pulse 125, temperature 98.4 F (36.9 C), temperature source Oral, resp. rate 39, height 4\' 11"  (1.499 m), weight 51 kg (112 lb 7 oz), SpO2 78 %.  Intake/Output Summary (Last 24 hours) at 09-Apr-2015 1354 Last data filed at 04/09/15 1200  Gross per 24 hour  Intake  442.3 ml  Output      0 ml  Net  442.3 ml   Exam: General:  the patient is obtunded Resp:  Respiratory effort is labored and ineffective  Data Reviewed:  Basic Metabolic Panel:  Recent Labs Lab 03/09/15 0420 03/10/15 0529 03/11/15 0244 03/12/15 0558 03/13/15 0258  NA 134* 141 145 151* 152*  K 3.5 3.4* 3.5 3.1* 4.0  CL 101 112* 116* 121* 125*  CO2 18* 16* 16* 16* 15*  GLUCOSE 131* 141* 155* 163* 152*  BUN 42* 43* 48* 49* 55*  CREATININE 2.58* 2.35* 2.09* 1.94* 1.87*  CALCIUM 9.4 10.0 10.3 11.0* 11.1*  MG 2.1 2.1 2.0 2.1 2.0  PHOS 4.3 4.3 6.1* 5.0* 5.8*   CBC:  Recent Labs Lab 03/09/15 0420 03/10/15 0529 03/10/15 1443 03/11/15 0244 03/12/15 0558 03/13/15 0258  WBC 2.4* 4.3 6.6 4.8 4.1 4.6  NEUTROABS 2.0 3.9  --  4.6 3.9 4.4  HGB 7.0* 6.3* 8.9* 7.6* 7.1* 6.2*  HCT 20.2* 18.6* 25.8* 22.7* 21.2* 18.9*  MCV 98.5 100.5* 97.0 97.4 99.5 101.1*  PLT 63* 69* 60* 56* 40* 31*   Liver Function Tests:  Recent Labs Lab 03/09/15 0420 03/10/15 0529 03/11/15 0244 03/12/15 0558  AST 42* 40 25 39  ALT 28 16 22 23   ALKPHOS 23* 16* 17* 16*  BILITOT >50.0* >50.0* >50.0* >50.0*  PROT 5.7* 5.8* 6.1* 6.3*  ALBUMIN 3.6 3.9 4.2 4.3    Recent Labs Lab 03/09/15 0420 03/10/15 0930 03/11/15 0244 03/12/15 0558 03/13/15 0325  AMMONIA 60* 63* 142* 131* 180*    Coags:  Recent Labs Lab 03/10/15 0529 03/11/15 0244 03/12/15 0558  03/13/15 0258  INR 7.64* 6.64* 7.42* 8.95*   CBG:  Recent Labs Lab 03/11/15 2355 03/12/15 0748 03/12/15 1311 03/12/15 1926 03/12/15 2323  GLUCAP 162* 144* 143* 122* 132*    Recent Results (from the past 240 hour(s))  Urine culture     Status: None   Collection Time: 03/14/2015  9:33 PM  Result Value Ref Range Status   Specimen Description URINE, CLEAN CATCH  Final   Special Requests NONE  Final   Culture 4,000 COLONIES/mL INSIGNIFICANT GROWTH  Final   Report Status 03/07/2015 FINAL  Final  Blood culture (routine x 2)     Status: None   Collection Time: 03/02/2015  9:45 PM  Result Value Ref Range Status   Specimen Description BLOOD LEFT ARM  Final   Special Requests BOTTLES DRAWN AEROBIC AND ANAEROBIC 5CC  Final   Culture NO GROWTH 5 DAYS  Final   Report Status 03/11/2015 FINAL  Final  Blood culture (routine x 2)     Status: None   Collection Time: 03/17/2015  9:50 PM  Result Value Ref  Range Status   Specimen Description BLOOD RIGHT ARM  Final   Special Requests BOTTLES DRAWN AEROBIC AND ANAEROBIC 5CC  Final   Culture NO GROWTH 5 DAYS  Final   Report Status 03/11/2015 FINAL  Final  Rapid strep screen     Status: None   Collection Time: 2015-03-06 10:39 PM  Result Value Ref Range Status   Streptococcus, Group A Screen (Direct) NEGATIVE NEGATIVE Final    Comment: (NOTE) A Rapid Antigen test may result negative if the antigen level in the sample is below the detection level of this test. The FDA has not cleared this test as a stand-alone test therefore the rapid antigen negative result has reflexed to a Group A Strep culture.   Culture, group A strep     Status: None   Collection Time: 03/06/15 10:39 PM  Result Value Ref Range Status   Specimen Description THROAT  Final   Special Requests Immunocompromised  Final   Culture NO GROUP A STREP (S.PYOGENES) ISOLATED  Final   Report Status 03/08/2015 FINAL  Final  Culture, body fluid-bottle     Status: None   Collection Time:  03/06/15  1:40 AM  Result Value Ref Range Status   Specimen Description FLUID PLEURAL  Final   Special Requests BOTTLES DRAWN AEROBIC AND ANAEROBIC 10CC ADD  Final   Culture NO GROWTH 5 DAYS  Final   Report Status 03/12/2015 FINAL  Final  Gram stain     Status: None   Collection Time: 03/06/15  1:40 AM  Result Value Ref Range Status   Specimen Description FLUID PLEURAL  Final   Special Requests NONE  Final   Gram Stain   Final    CYTOSPIN SMEAR WBC PRESENT, PREDOMINANTLY MONONUCLEAR NO ORGANISMS SEEN    Report Status 03/07/2015 FINAL  Final  MRSA PCR Screening     Status: None   Collection Time: 03/06/15  9:03 AM  Result Value Ref Range Status   MRSA by PCR NEGATIVE NEGATIVE Final    Comment:        The GeneXpert MRSA Assay (FDA approved for NASAL specimens only), is one component of a comprehensive MRSA colonization surveillance program. It is not intended to diagnose MRSA infection nor to guide or monitor treatment for MRSA infections.      Studies:   Recent x-ray studies have been reviewed in detail by the Attending Physician  Scheduled Meds:  Scheduled Meds: . glycopyrrolate  0.4 mg Intravenous Q8H  . sodium chloride flush  3 mL Intravenous Q12H    Time spent on care of this patient: 15 mins   MCCLUNG,JEFFREY T , MD   [facility] Office  [phone] Pager - Text Page per Loretha Stapler as per below:  On-Call/Text Page:      [email or web address]      password [email or web address]  If 7PM-7AM, please contact night-coverage [email or web address] Password [email or web address] 03/22/2015, 1:54 PM   LOS: 10 days

## 2015-03-29 NOTE — Progress Notes (Signed)
Patient passed at 1700; no respirations or heart tones auscultated by 2 RN's,. [place] and [employer].  Dr. Sharon Seller notified of death.  Support offered to and given to family.  200 cc of Fentanyl wasted in sink witnessed by Luther Hearing and Shanda Bumps.  [place], Mitzi Hansen

## 2015-03-29 NOTE — Progress Notes (Signed)
RN counted patient's respirations as 20 at bedside because monitor was showing patient as tachypneic with RR of 48, even after bolus given and basal Fentanyl infusion increased.  Patient's respirations are irregular and uneven, causing monitor to detect extra breaths. Will continue to monitor and adjust Fentanyl appropriately. [place], Mitzi Hansen

## 2015-03-29 NOTE — Discharge Summary (Signed)
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 Patient is followed by [facility] gastroenterology and was discharged from that institution on 03/02/2015 following treatment for SBP. She initially did okay back at home until developing fever and severe abdominal pain acutely on 03-06-15. Her adoptive mother states Melanie Conley has had a difficult course recently with serial hospital admissions related to cirrhosis. Complications have included multiple courses of SBP, acute blood loss from esophageal varices, and hepatic encephalopathy. Per [facility] notes, she was apparently on the pediatric transplant list but was never able to be transitioned to the adult last due to an inability to pass the psychosocial screening. She has been managed with prophylactic Bactrim, rifaximin, lactulose, prednisone, Imuran, and ursodiol. She's had numerous banding procedures for her varices. There's been no recent hematemesis, melena, or hematochezia.  In ED, patient was found to have oral temperature 37.9 C, sinus tachycardia in the 110s, and blood pressure in the 100/50 range. Her abdomen was noted to be distended and tender. Paracentesis was performed. She was started on empiric cefepime and vancomycin for presumed SBP. Blood work returned notable for sodium of 119, BUN of 42, serum creatinine of 3.0,  total bilirubin >50, and INR of 3.9. Creatinine was 0.09 at time of discharge from [facility] on 03/02/2015. She had large volume paracentesis performed during that admission. 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   Hospital Course:  Despite prolonged aggressive efforts to stabilize Melanie Conley her liver function progressively declined over the course of her hospital stay.  It became clear that her renal function was not going to recover and that her liver function was not going to improve.  It had already been established that the patient was not a candidate for liver transplant and therefore no further active treatment was available in the face of fulminant hepatic failure.  Multiple lengthy discussions were carried out with the patient's mother who initially had a significant amount of difficulty accepting the fact that no further therapies were available, and that Melanie Conley was actively dying.  Ultimately the patient's mother accepted this unfortunate fact and agreed to transition to comfort care.  This transition was made on 03/13/2015.  On 03/16/15 at 5:00 PM the patient died peacefully in her hospital bed.  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 notes - there is not remaining therapy available to prolong her life further, as she has progressed into fulminant  hepatic failure w/ refractory hepatorenal syndrome  - MELD 52+, corresponding to 100% 3 mo mortality  - Has known esophageal varices with history of bleeding and multiple banding procedures  Hepatic encephalopathy   Severe chronic Hyponatremia  - Na nadir of 119 this admit  Coagulopathy of liver dz w/ thrombocytopenia  - INR severely elevated reflecting further decline in liver fxn/fulminant hepatic failure   Acute kidney injury without CKD c/w hepatorenal syndrome  - Discharged from [facility] on 03/02/15 with SCr of 0.09 - 3.20 at time of this admit w/ peak at 3.43 - treated with midodrine 7.5 TID, octreotide infusion, and albumin 12.5 g QID w/o response/improvement   Severe Hypokalemia  - replaced  Hyperglycemia  - no reported hx of DM - A1c 4.1   Normocytic anemia  - no evidence of acute blood loss - suspect "anemia of chronic disease" compounded by acute kidney injury    Signed: Othal Kubitz T  [facility] 03/26/2015, 5:56 PM

## 2015-03-29 DEATH — deceased

## 2016-08-21 IMAGING — CR DG CHEST 1V PORT
1 series · 1 of 1 positions shown · non-contrast
Comparison: 7344 hours today, and earlier

CLINICAL DATA: 23-year-old female with sepsis. Intubated and
central line placement. Initial encounter.

EXAM:
PORTABLE CHEST - 1 VIEW

[AP]
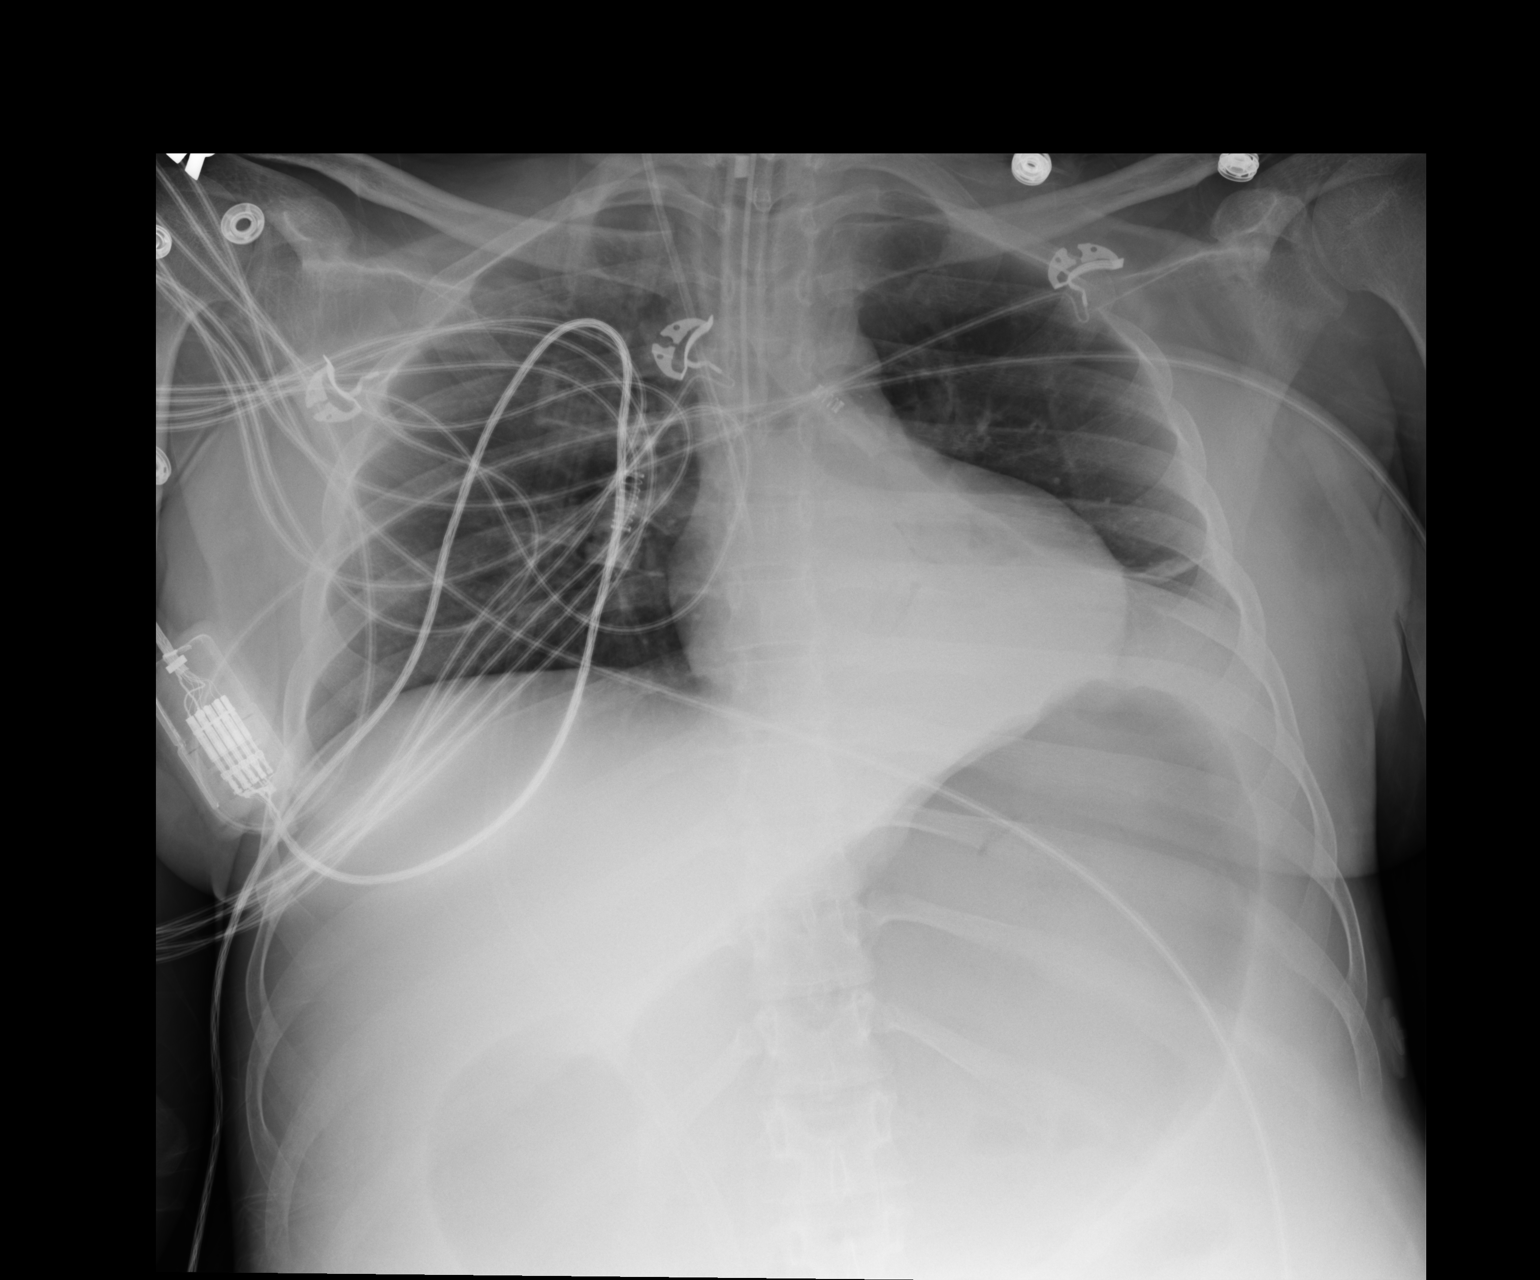

[1 of 1 positions shown; findings below may reference images not displayed]

FINDINGS: Portable AP semi upright view at 1417 hours. Endotracheal tube tip
is at the carina and directed slightly toward the right mainstem
bronchus. Right IJ central line is been placed, tip projects at the
lower SVC level.

No pneumothorax. Left lower lobe collapse or consolidation is new.
No pulmonary edema or pleural effusion. Moderate to severe gaseous
distension of the stomach is new. Normal cardiac size and
mediastinal contours.
IMPRESSION: 1. Intubated, endotracheal tube tip at the carina or just inside the
right mainstem bronchus. Retract 3 cm for placement at the level of
the clavicles.
2. Left lower lobe collapse/consolidation. Moderate to severe
gaseous distension of the stomach.
3. Right IJ central line placed, tip at the lower SVC level. No
pneumothorax.
Study discussed by telephone with 2 Heart RN Leonardo Lauture on

## 2016-08-21 IMAGING — CR DG CHEST 1V PORT
1 series · 1 of 1 positions shown · non-contrast
Comparison: August 11, 2014.

CLINICAL DATA: Shortness of breath.

EXAM:
PORTABLE CHEST - 1 VIEW

[AP]
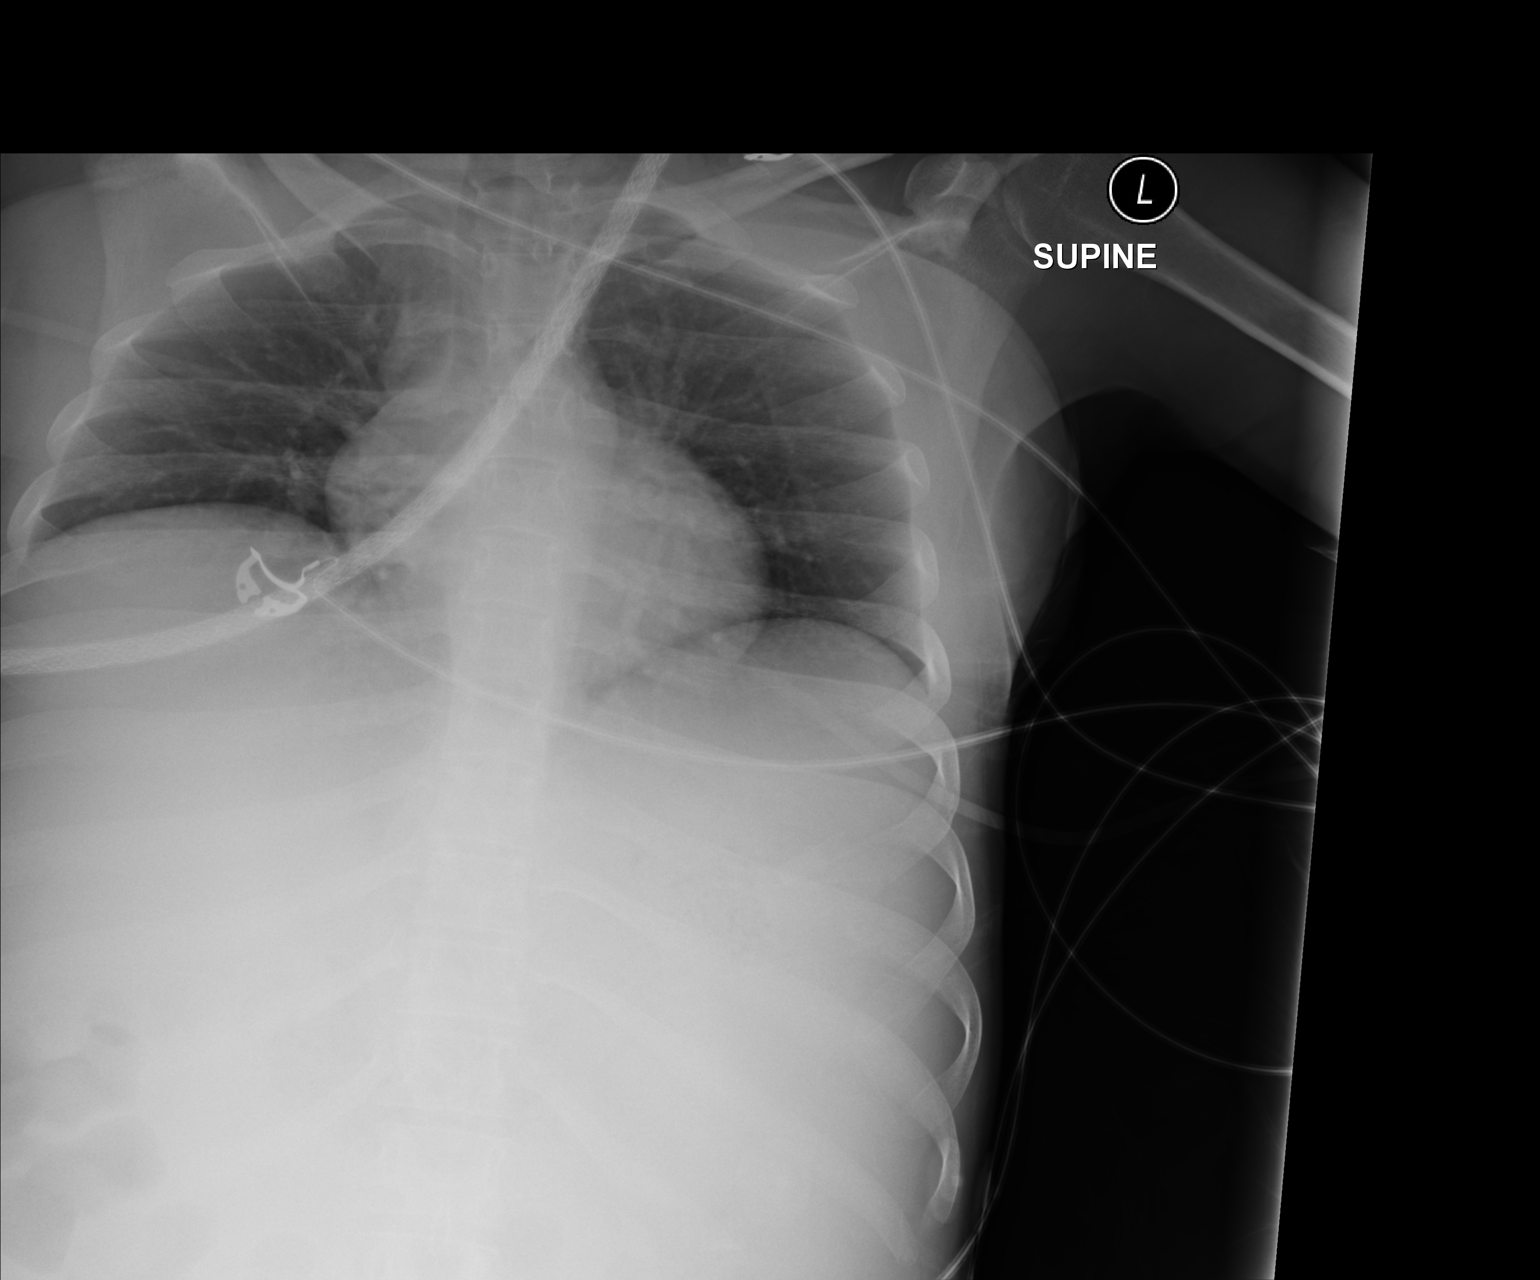

[1 of 1 positions shown; findings below may reference images not displayed]

FINDINGS: The heart size and mediastinal contours are within normal limits.
Both lungs are clear. No pneumothorax or pleural effusion is noted.
The visualized skeletal structures are unremarkable.
IMPRESSION: No acute cardiopulmonary abnormality seen.

## 2017-01-15 IMAGING — US US PARACENTESIS
1 series · 6 of 6 positions shown · non-contrast
Comparison: Prior paracentesis on 06/04/2014

MEDICATIONS:
None.

COMPLICATIONS:
None immediate

INDICATION: Patient with history of cirrhosis, autoimmune hepatitis with PSC
overlap syndrome, recurrent ascites, abdominal pain. Request is made
for diagnostic and therapeutic paracentesis.

EXAM:
ULTRASOUND-GUIDED DIAGNOSTIC AND THERAPEUTIC PARACENTESIS
TECHNIQUE: Informed written consent was obtained from the patient after a
discussion of the risks, benefits and alternatives to treatment. A
timeout was performed prior to the initiation of the procedure.

[Series 1: us paracentesis · 0.27mm/px · 6 of 6 slices shown]
[im 1/6]
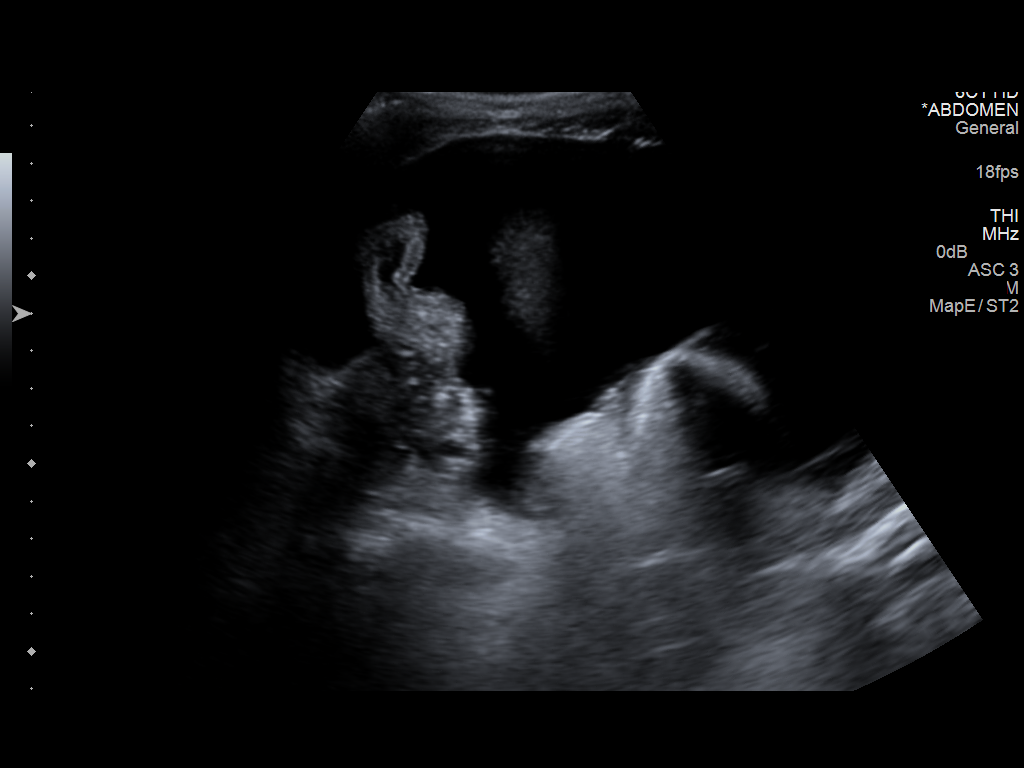
[im 2/6]
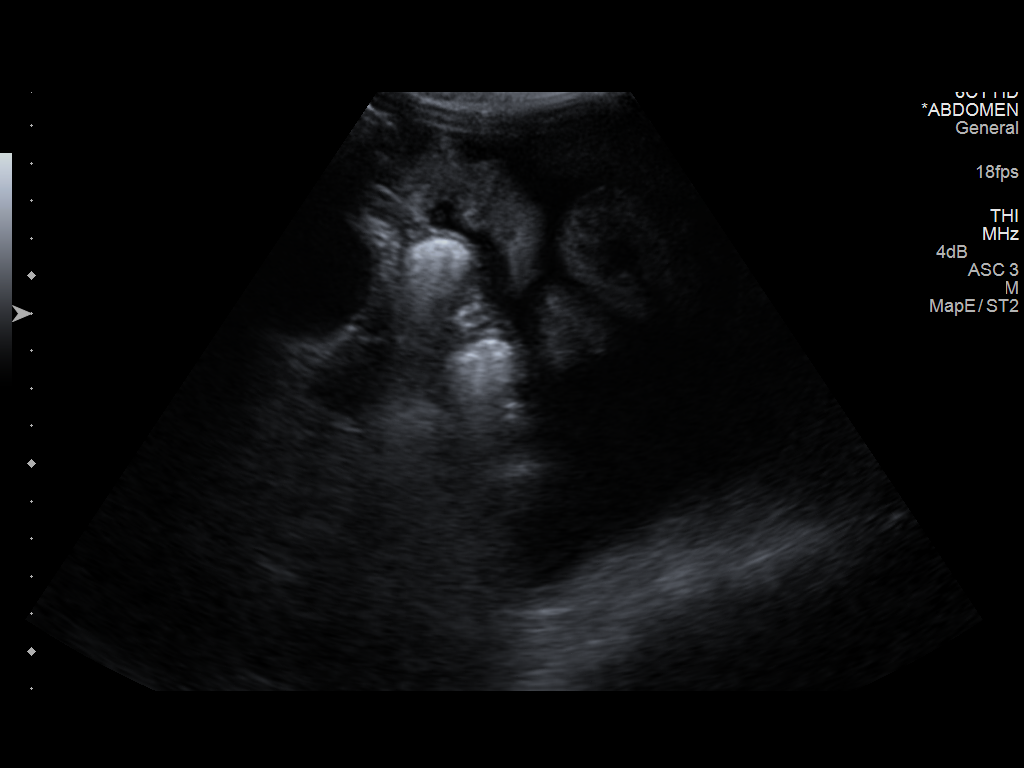
[im 3/6]
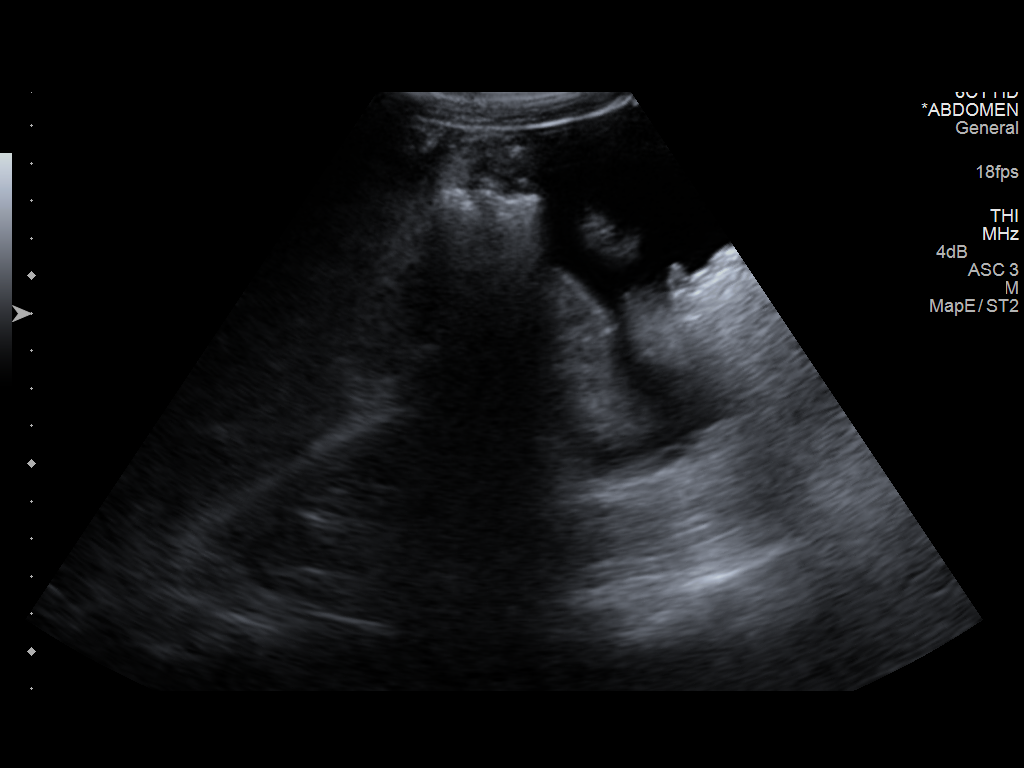
[im 4/6]
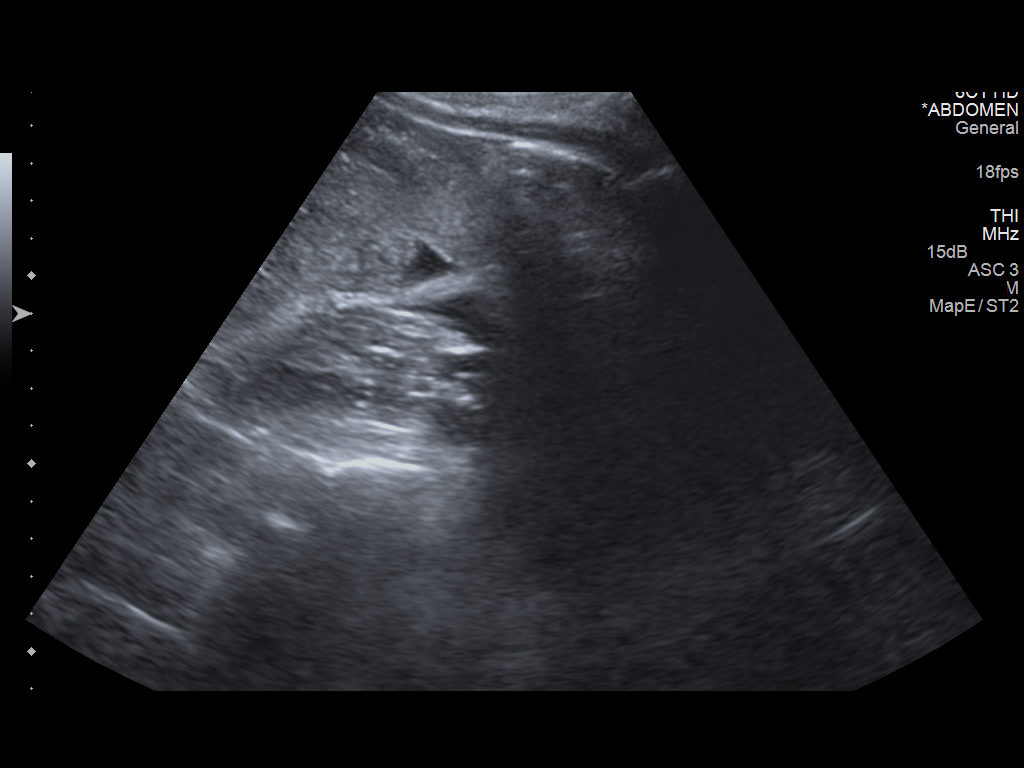
[im 5/6]
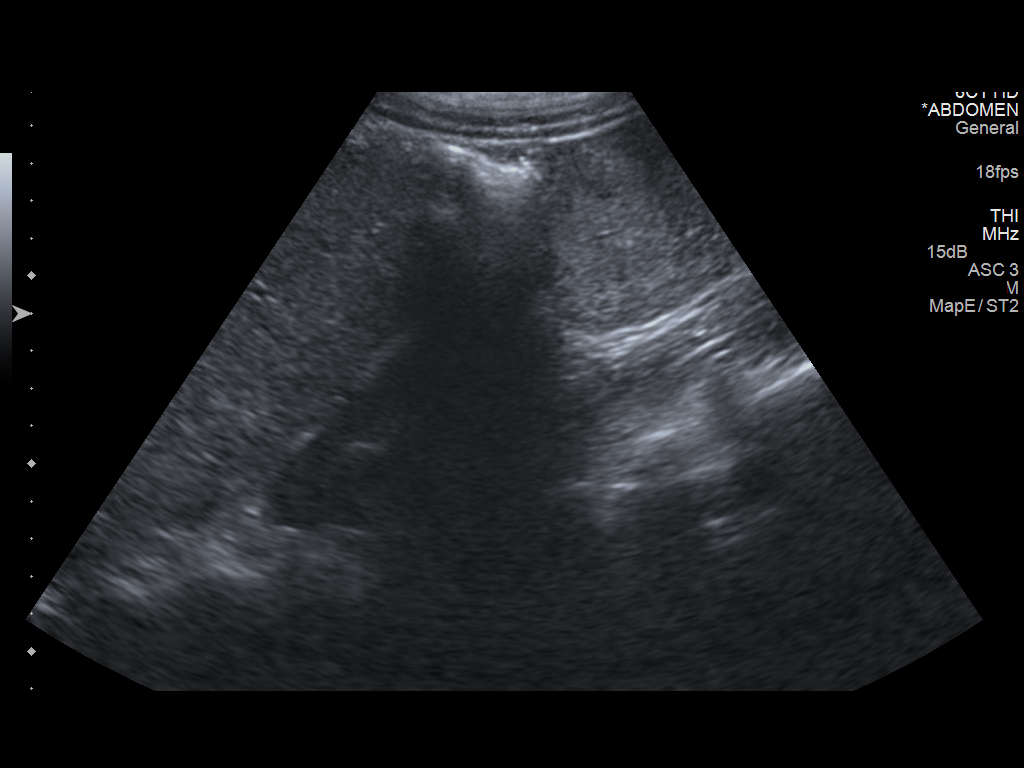
[im 6/6]
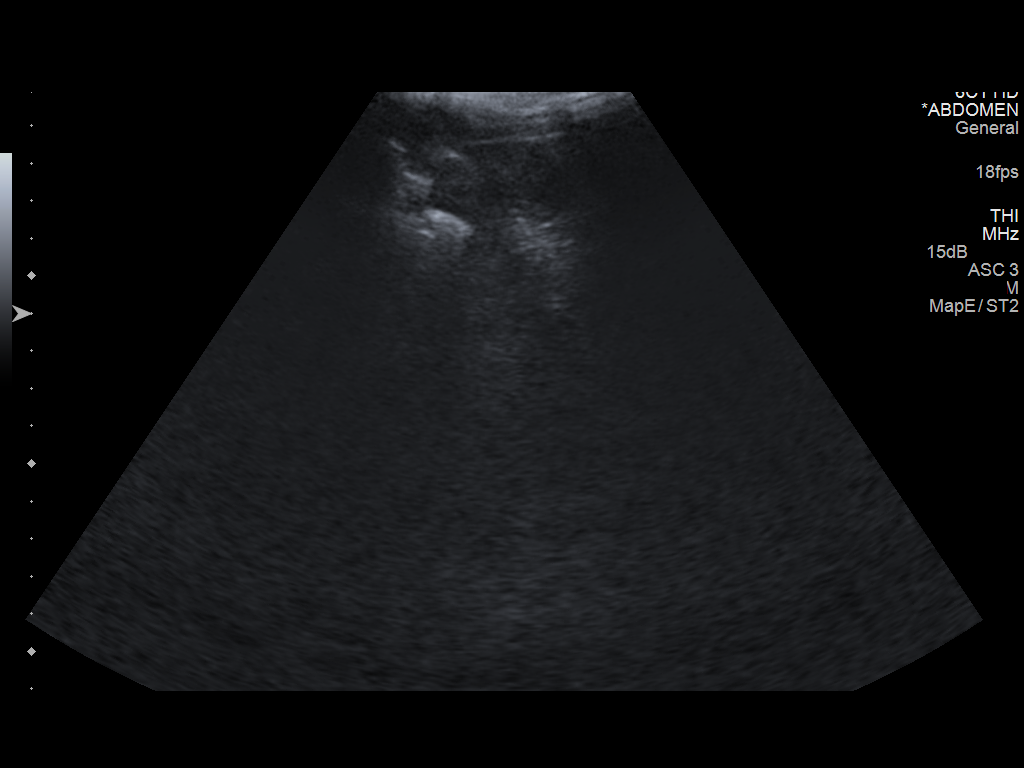

[6 of 6 positions shown; findings below may reference images not displayed]

Initial ultrasound scanning demonstrates a small to moderate amount
of ascites within the right lower abdominal quadrant. The right
lower abdomen was prepped and draped in the usual sterile fashion.
1% lidocaine was used for local anesthesia. Under direct ultrasound
guidance, a 19 gauge, 7-cm, Yueh catheter was introduced. An
ultrasound image was saved for documentation purposed. The
paracentesis was performed. The catheter was removed and a dressing
was applied. The patient tolerated the procedure well without
immediate post procedural complication.
FINDINGS: A total of approximately 2.5 liters of clear, yellow fluid was
removed. A portion of the fluid was submitted to the lab for
preordered studies.
IMPRESSION: Successful ultrasound-guided diagnostic and therapeutic paracentesis
yielding 2.5 liters of peritoneal fluid.

## 2017-02-21 IMAGING — US US PARACENTESIS
1 series · 8 of 8 positions shown · non-contrast
Comparison: Paracentesis 07/30/14.

MEDICATIONS:
None.

COMPLICATIONS:
None immediate

INDICATION: Cirrhosis, recurrent ascites and request for paracentesis.

EXAM:
ULTRASOUND-GUIDED PARACENTESIS
TECHNIQUE: Informed written consent was obtained from the patient after a
discussion of the risks, benefits and alternatives to treatment. A
timeout was performed prior to the initiation of the procedure.

[Series 1: us paracentesis · 0.24mm/px · 8 of 8 slices shown]
[im 1/8]
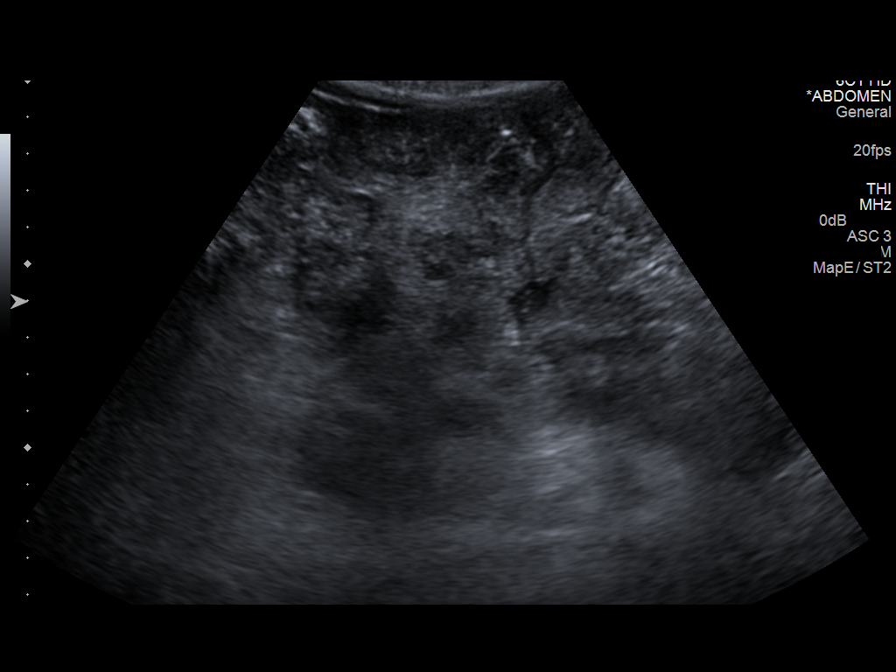
[im 2/8]
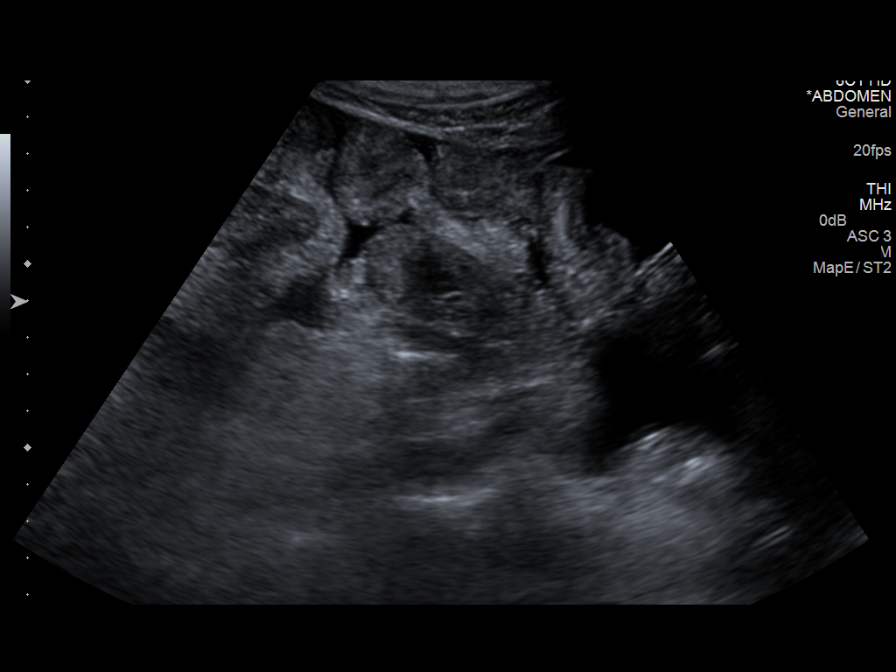
[im 3/8]
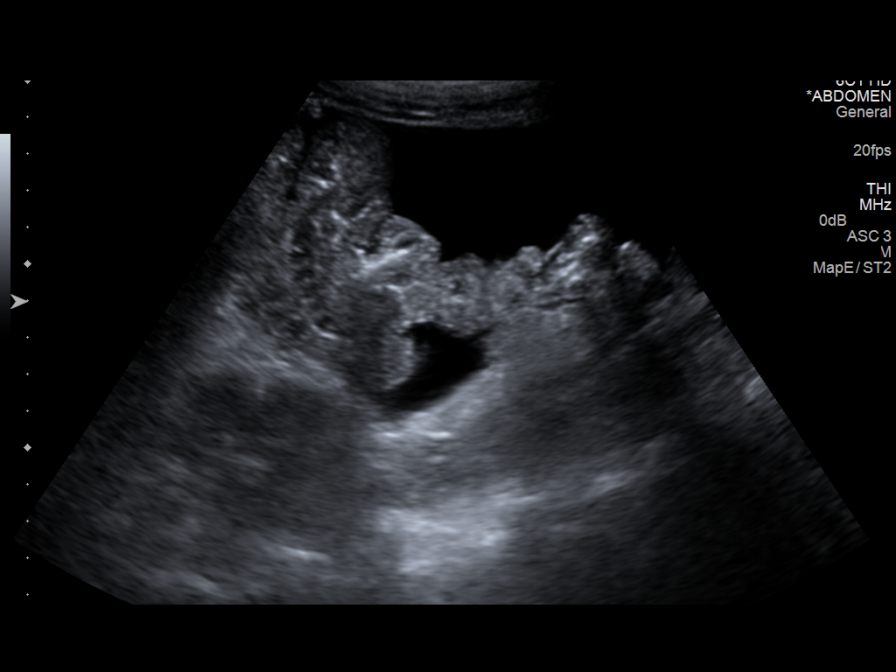
[im 4/8]
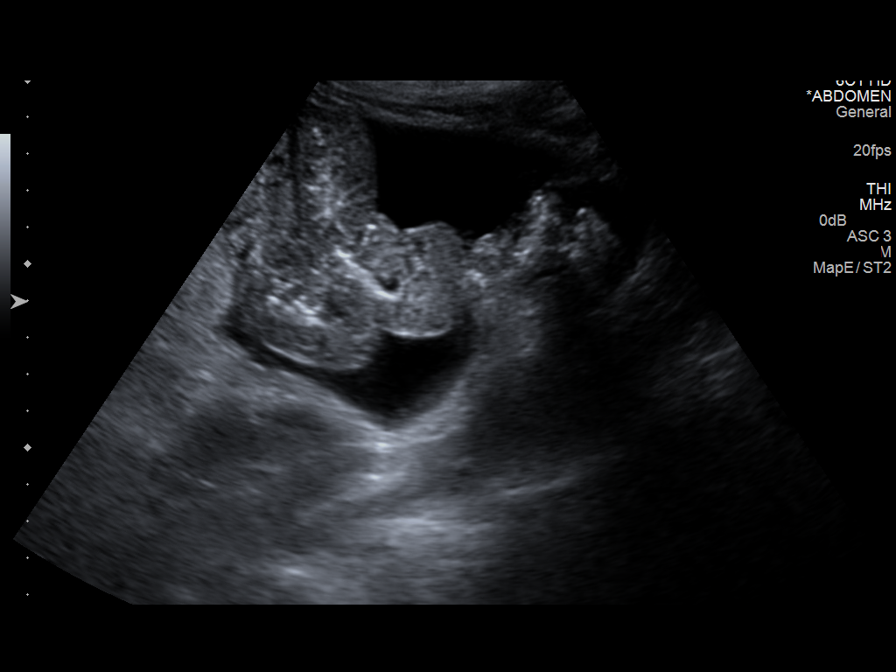
[im 5/8]
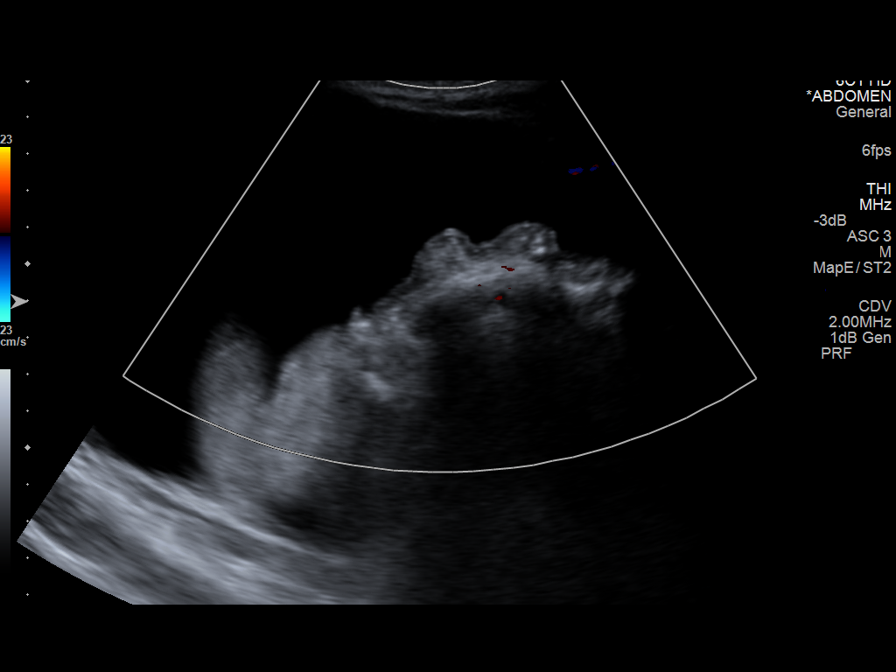
[im 6/8]
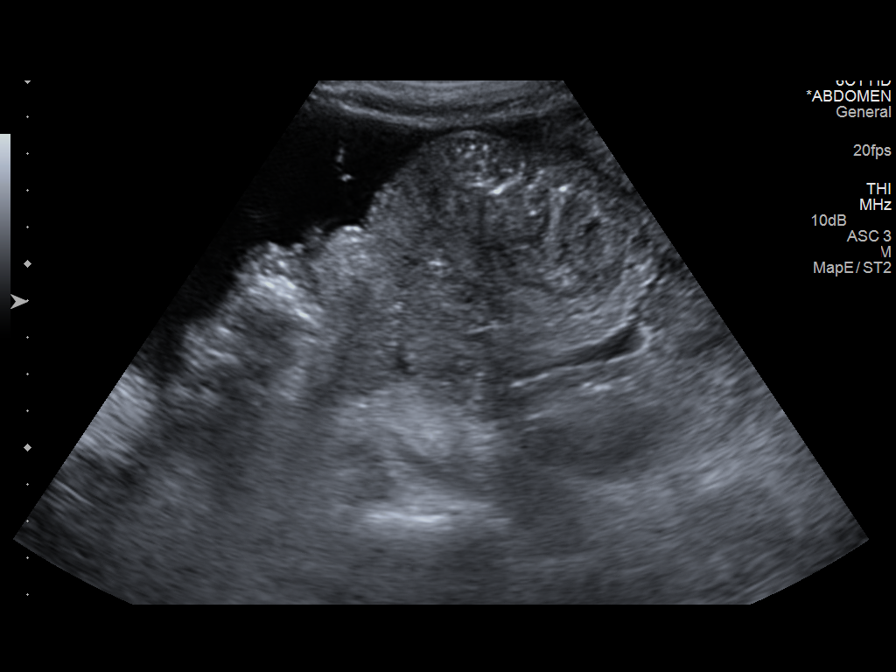
[im 7/8]
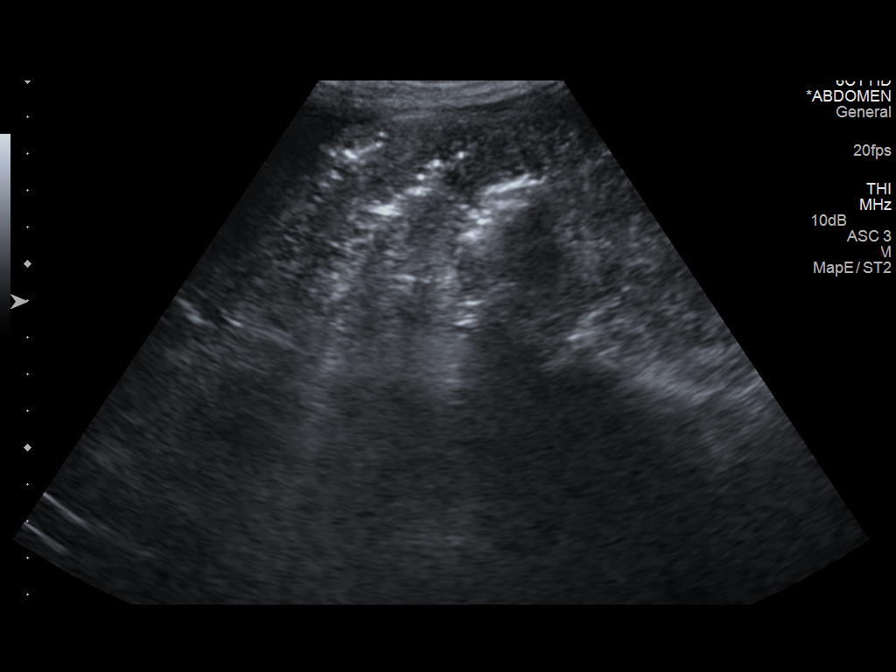
[im 8/8]
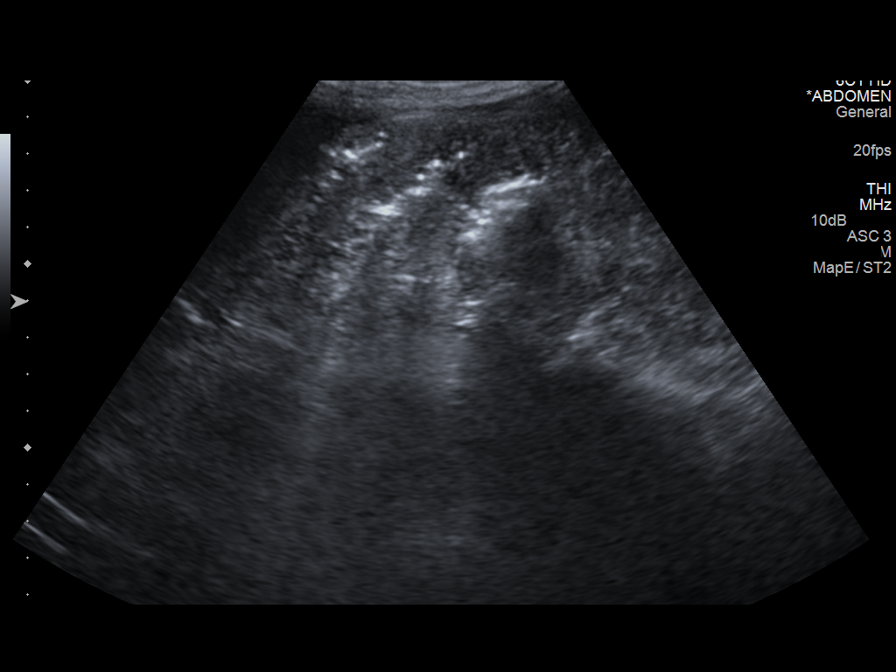

[8 of 8 positions shown; findings below may reference images not displayed]

Initial ultrasound scanning demonstrates a small amount of ascites
within the left lower abdominal quadrant. The left lower abdomen was
prepped and draped in the usual sterile fashion. 1% lidocaine was
used for local anesthesia.

Under direct ultrasound guidance, a 19 gauge, 7-cm, Yueh catheter
was introduced. An ultrasound image was saved for documentation
purposed. The paracentesis was performed. The catheter was removed
and a dressing was applied. The patient tolerated the procedure well
without immediate post procedural complication.
FINDINGS: A total of approximately 2 liters of serous fluid was removed.
Samples were sent to the laboratory as requested by the clinical
team.
IMPRESSION: Successful ultrasound-guided paracentesis yielding 2 liters of
peritoneal fluid.

## 2017-05-08 IMAGING — US US PARACENTESIS
1 series · 5 of 5 positions shown · non-contrast
Comparison: None.

CLINICAL DATA: Cirrhosis. Recurrent ascites. Request therapeutic
paracentesis.

EXAM:
ULTRASOUND GUIDED PARACENTESIS

[Series 1: us paracentesis · 0.26mm/px · 5 of 5 slices shown]
[im 1/5]
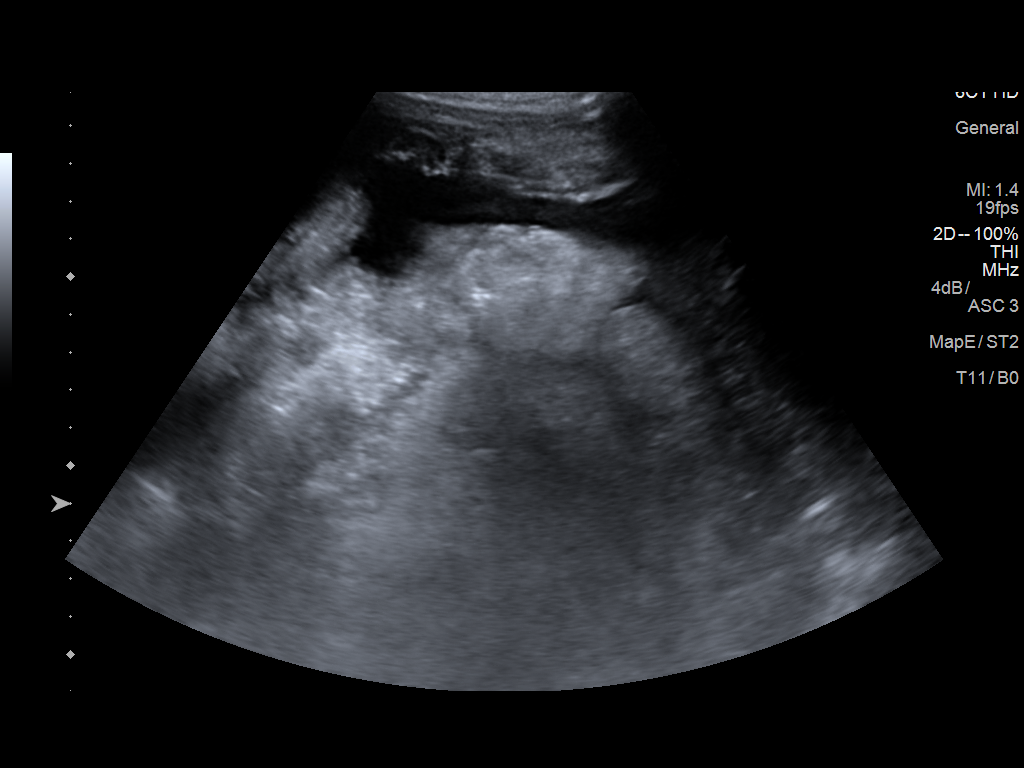
[im 2/5]
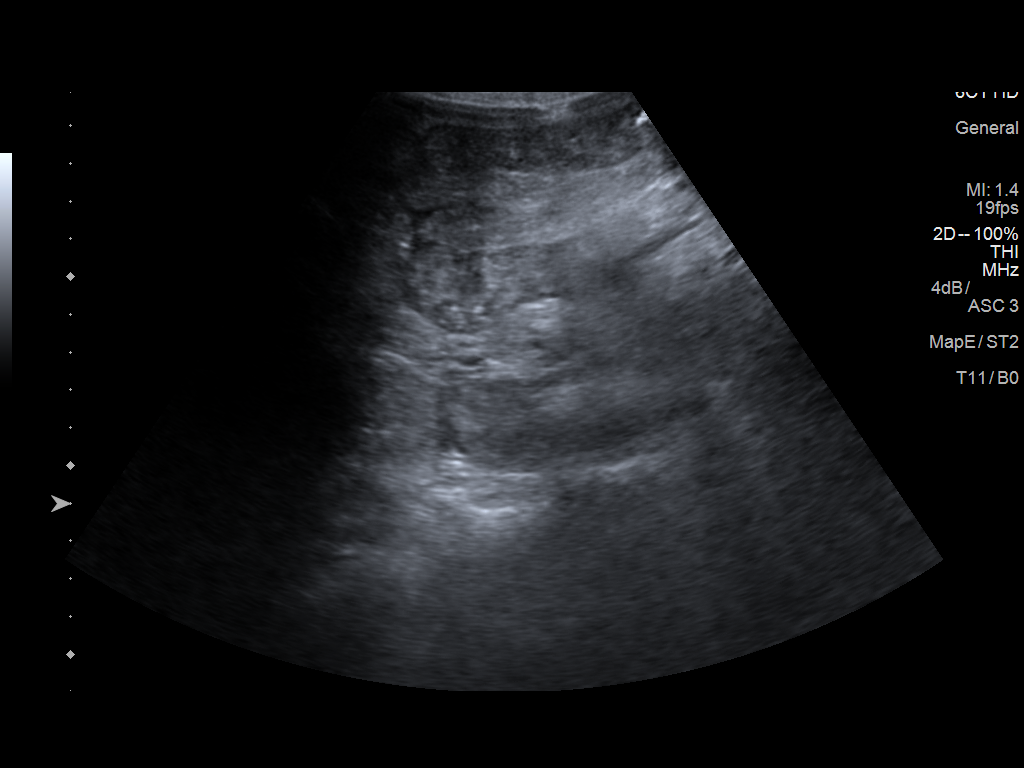
[im 3/5]
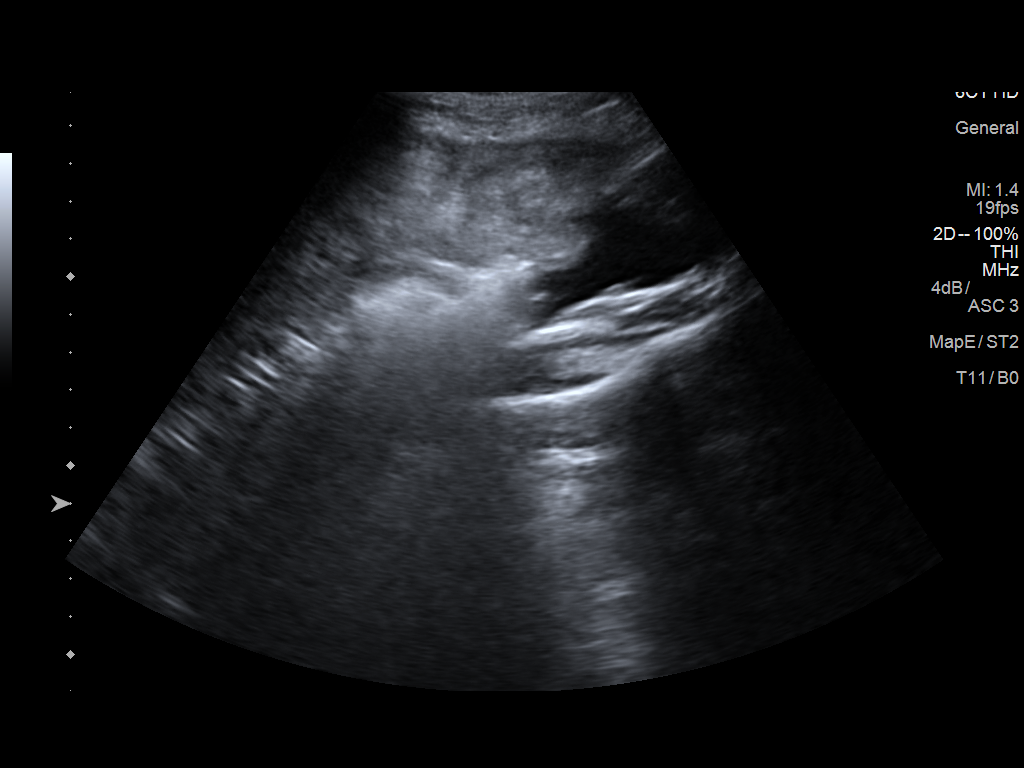
[im 4/5]
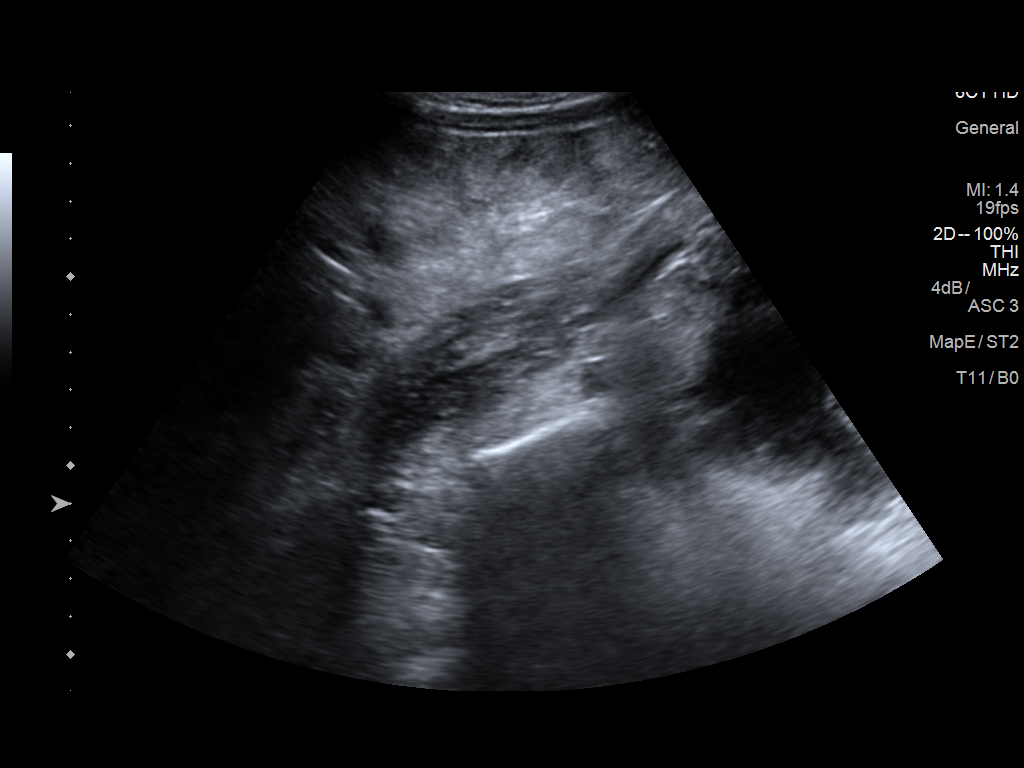
[im 5/5]
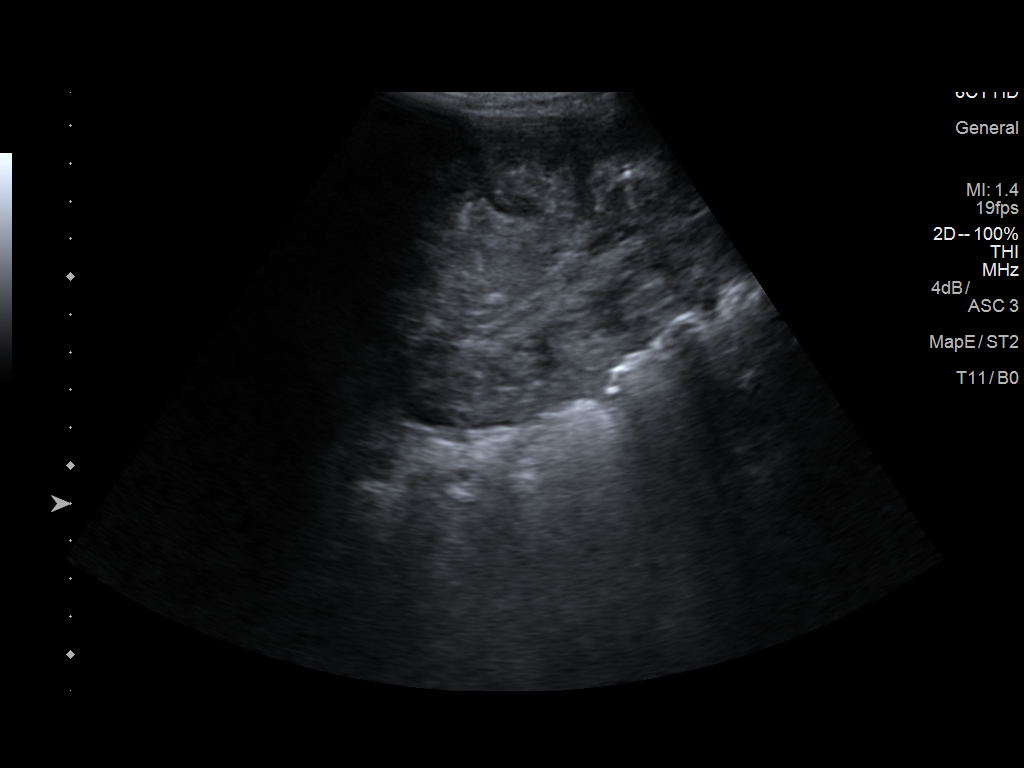

[5 of 5 positions shown; findings below may reference images not displayed]

PROCEDURE:
An ultrasound guided paracentesis was thoroughly discussed with the
patient and questions answered. The benefits, risks, alternatives
and complications were also discussed. The patient understands and
wishes to proceed with the procedure. Written consent was obtained.

Ultrasound was performed to localize and mark an adequate pocket of
fluid in the left lower quadrant of the abdomen. The area was then
prepped and draped in the normal sterile fashion. 1% Lidocaine was
used for local anesthesia. Under ultrasound guidance a 19 gauge Yueh
catheter was introduced. Paracentesis was performed. The catheter
was removed and a dressing applied.

COMPLICATIONS:
None immediate
FINDINGS: A total of approximately 5.3 L of clear yellow fluid was removed. A
fluid sample was not sent for laboratory analysis.
IMPRESSION: Successful ultrasound guided paracentesis yielding 5.3 L of ascites.
